# Patient Record
Sex: Male | Born: 1955 | Race: Black or African American | Hispanic: No | Marital: Single | Smoking: Never smoker
Health system: Southern US, Community
[De-identification: ages and names within clinical notes are randomized; demographics above are authoritative.]

## PROBLEM LIST (undated history)

## (undated) DIAGNOSIS — K5900 Constipation, unspecified: Secondary | ICD-10-CM

## (undated) DIAGNOSIS — R600 Localized edema: Secondary | ICD-10-CM

## (undated) DIAGNOSIS — T8859XA Other complications of anesthesia, initial encounter: Secondary | ICD-10-CM

## (undated) DIAGNOSIS — Z8719 Personal history of other diseases of the digestive system: Secondary | ICD-10-CM

## (undated) DIAGNOSIS — C61 Malignant neoplasm of prostate: Secondary | ICD-10-CM

## (undated) DIAGNOSIS — E785 Hyperlipidemia, unspecified: Secondary | ICD-10-CM

## (undated) DIAGNOSIS — G4733 Obstructive sleep apnea (adult) (pediatric): Secondary | ICD-10-CM

## (undated) DIAGNOSIS — Z789 Other specified health status: Secondary | ICD-10-CM

## (undated) DIAGNOSIS — I509 Heart failure, unspecified: Secondary | ICD-10-CM

## (undated) DIAGNOSIS — I10 Essential (primary) hypertension: Secondary | ICD-10-CM

## (undated) DIAGNOSIS — Z9989 Dependence on other enabling machines and devices: Secondary | ICD-10-CM

## (undated) DIAGNOSIS — G4730 Sleep apnea, unspecified: Secondary | ICD-10-CM

## (undated) DIAGNOSIS — M1990 Unspecified osteoarthritis, unspecified site: Secondary | ICD-10-CM

## (undated) DIAGNOSIS — K219 Gastro-esophageal reflux disease without esophagitis: Secondary | ICD-10-CM

## (undated) HISTORY — DX: Hyperlipidemia, unspecified: E78.5

## (undated) HISTORY — PX: CARDIAC CATHETERIZATION: SHX172

## (undated) HISTORY — DX: Morbid (severe) obesity due to excess calories: E66.01

## (undated) HISTORY — DX: Gastro-esophageal reflux disease without esophagitis: K21.9

## (undated) HISTORY — DX: Unspecified osteoarthritis, unspecified site: M19.90

## (undated) HISTORY — DX: Sleep apnea, unspecified: G47.30

## (undated) NOTE — *Deleted (*Deleted)
New Patient Office Visit  Subjective:  Patient ID: Thomas Gardner, male    DOB: 1956-01-24  Age: 84 y.o. MRN: [id number]         CC: No chief complaint on file.   HPI Thomas Gardner presents for visit to estb w/New Provider for psychotherapy & to transition care. Pt has recently exp'd excessive number of losses. 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  Pt presents today at the request of Clinician phone call on 03/28/20 due to Referral from Dr. Morey Hummingbird earlier this week (03/27/20) to f/u on his psychotherapy Tx.    Past Medical History:  Diagnosis Date  . Arthritis   . Asthma   . CHF (congestive heart failure) (HCC)   . Diabetes mellitus 10/20/01  . GERD (gastroesophageal reflux disease)   . Hyperlipidemia   . Hypertension   . Morbid obesity (HCC)   . Sleep apnea     Past Surgical History:  Procedure Laterality Date  . BREATH TEK H PYLORI  11/11/2011   Procedure: BREATH TEK H PYLORI;  Surgeon: Kandis Cocking, MD;  Location: [facility];  Service: General;  Laterality: N/A;  . CARDIAC CATHETERIZATION    . ESOPHAGOGASTRODUODENOSCOPY (EGD) WITH PROPOFOL N/A 05/04/2014   Procedure: ESOPHAGOGASTRODUODENOSCOPY (EGD) WITH PROPOFOL;  Surgeon: Florencia Reasons, MD;  Location: [facility] ENDOSCOPY;  Service: Endoscopy;  Laterality: N/A;    Family History  Problem Relation Age of Onset  . Cancer Mother        ovarian  . Diabetes Mother   . Stroke Father   . Cancer Maternal Aunt        breast  . Diabetes Maternal Aunt   . Cancer Maternal Uncle        colon    Social History   Socioeconomic History  . Marital status: Single    Spouse name: Not on file  . Number of children: Not on file  . Years of education: Not on file  . Highest education level: Not on file  Occupational  History  . Occupation: Beautician  Tobacco Use  . Smoking status: Never Smoker  . Smokeless tobacco: Never Used  Substance and Sexual Activity  . Alcohol use: No    Alcohol/week: 0.0 standard drinks  . Drug use: No  . Sexual activity: Not on file  Other Topics Concern  . Not on file  Social History Narrative  . Not on file   Social Determinants of Health   Financial Resource Strain:   . Difficulty of Paying Living Expenses: Not on file  Food Insecurity:   . Worried About Programme researcher, broadcasting/film/video in the Last Year: Not on file  . Ran Out of Food in the Last Year: Not on file  Transportation Needs:   . Lack of Transportation (Medical): Not on file  . Lack of Transportation (Non-Medical): Not on file  Physical Activity:   . Days of Exercise per Week: Not on file  . Minutes of Exercise per Session: Not on file  Stress:   . Feeling of Stress : Not on file  Social Connections:   . Frequency of Communication with Friends and Family: Not on file  . Frequency of Social Gatherings with Friends and Family: Not on file  . Attends Religious Services: Not on  file  . Active Member of Clubs or Organizations: Not on file  . Attends Banker Meetings: Not on file  . Marital Status: Not on file  Intimate Partner Violence:   . Fear of Current or Ex-Partner: Not on file  . Emotionally Abused: Not on file  . Physically Abused: Not on file  . Sexually Abused: Not on file    ROS Review of Systems  Objective:   Today's Vitals: There were no vitals taken for this visit.  Physical Exam  Assessment & Plan:                     Problem List Items Addressed This Visit    None      Outpatient Encounter Medications as of 03/29/2020  Medication Sig  . albuterol (PROVENTIL HFA;VENTOLIN HFA) 108 (90 Base) MCG/ACT inhaler Inhale 2 puffs into the lungs every 6 (six) hours as needed for wheezing or shortness of breath. [facility]  . carvedilol (COREG) 25 MG tablet Take 1 tablet (25  mg total) by mouth 2 (two) times daily with a meal.  . chlorthalidone (HYGROTON) 25 MG tablet Take 1 tablet (25 mg total) by mouth daily.  . insulin aspart (NOVOLOG) 100 UNIT/ML FlexPen Inject 12 Units into the skin 3 (three) times daily with meals.  . Insulin Degludec-Liraglutide (XULTOPHY) 100-3.6 UNIT-MG/ML SOPN Inject 35 Units into the skin daily.  . insulin glargine (LANTUS SOLOSTAR) 100 UNIT/ML Solostar Pen Inject 20 Units into the skin 2 (two) times daily. IM Program  . Insulin Pen Needle (UNIFINE PENTIPS) 31G X 5 MM MISC Place at tip of insulin pen and use as directed.  . liraglutide (VICTOZA) 18 MG/3ML SOPN Inject 1.8 mg into the skin daily. IM program  . losartan (COZAAR) 50 MG tablet Take 2 tablets (100 mg total) by mouth daily.  Marland Kitchen PROVENTIL HFA 108 (90 Base) MCG/ACT inhaler INHALE 2 PUFFS INTO THE LUNGS EVERY 6 HOURS AS NEEDED FOR WHEEZING ORSHORTNESS OF BREATH.  . rosuvastatin (CRESTOR) 20 MG tablet Take 1 tablet (20 mg total) by mouth daily. IM program, pick up  . sertraline (ZOLOFT) 50 MG tablet Take 1 tablet (50 mg total) by mouth daily.  Marland Kitchen ZETIA 10 MG tablet TAKE 1 TABLET (10MG  TOTAL) BY MOUTH DAILY.   No facility-administered encounter medications on file as of 03/29/2020.    Follow-up: No follow-ups on file.   Deneise Lever, LMFT

---

## 1998-07-11 ENCOUNTER — Ambulatory Visit (HOSPITAL_COMMUNITY): Admission: RE | Admit: 1998-07-11 | Discharge: 1998-07-11 | Payer: Self-pay | Admitting: Internal Medicine

## 2001-05-20 DIAGNOSIS — E119 Type 2 diabetes mellitus without complications: Secondary | ICD-10-CM

## 2001-05-20 HISTORY — DX: Type 2 diabetes mellitus without complications: E11.9

## 2004-08-07 ENCOUNTER — Emergency Department (HOSPITAL_COMMUNITY): Admission: EM | Admit: 2004-08-07 | Discharge: 2004-08-07 | Payer: Self-pay | Admitting: Emergency Medicine

## 2004-11-27 ENCOUNTER — Ambulatory Visit (HOSPITAL_COMMUNITY): Admission: RE | Admit: 2004-11-27 | Discharge: 2004-11-27 | Payer: Self-pay | Admitting: Internal Medicine

## 2004-11-27 ENCOUNTER — Ambulatory Visit: Payer: Self-pay | Admitting: Internal Medicine

## 2005-04-26 ENCOUNTER — Encounter: Admission: RE | Admit: 2005-04-26 | Discharge: 2005-04-26 | Payer: Self-pay | Admitting: Internal Medicine

## 2005-08-29 ENCOUNTER — Emergency Department (HOSPITAL_COMMUNITY): Admission: EM | Admit: 2005-08-29 | Discharge: 2005-08-29 | Payer: Self-pay | Admitting: Family Medicine

## 2006-06-23 ENCOUNTER — Encounter: Admission: RE | Admit: 2006-06-23 | Discharge: 2006-06-23 | Payer: Self-pay | Admitting: Orthopedic Surgery

## 2006-06-30 ENCOUNTER — Encounter: Admission: RE | Admit: 2006-06-30 | Discharge: 2006-06-30 | Payer: Self-pay | Admitting: Orthopedic Surgery

## 2006-08-27 ENCOUNTER — Ambulatory Visit (HOSPITAL_COMMUNITY): Admission: RE | Admit: 2006-08-27 | Discharge: 2006-08-27 | Payer: Self-pay | Admitting: Orthopedic Surgery

## 2007-12-31 ENCOUNTER — Emergency Department (HOSPITAL_COMMUNITY): Admission: EM | Admit: 2007-12-31 | Discharge: 2007-12-31 | Payer: Self-pay | Admitting: Family Medicine

## 2008-01-05 ENCOUNTER — Emergency Department (HOSPITAL_COMMUNITY): Admission: EM | Admit: 2008-01-05 | Discharge: 2008-01-05 | Payer: Self-pay | Admitting: Emergency Medicine

## 2008-06-02 ENCOUNTER — Emergency Department (HOSPITAL_COMMUNITY): Admission: EM | Admit: 2008-06-02 | Discharge: 2008-06-02 | Payer: Self-pay | Admitting: Emergency Medicine

## 2009-05-20 HISTORY — PX: CARDIAC CATHETERIZATION: SHX172

## 2010-07-22 ENCOUNTER — Emergency Department (HOSPITAL_BASED_OUTPATIENT_CLINIC_OR_DEPARTMENT_OTHER)
Admission: EM | Admit: 2010-07-22 | Discharge: 2010-07-22 | Disposition: A | Discharge status: Home / Self Care | Payer: BC Managed Care – PPO | Attending: Emergency Medicine | Admitting: Emergency Medicine

## 2010-07-22 ENCOUNTER — Emergency Department (INDEPENDENT_AMBULATORY_CARE_PROVIDER_SITE_OTHER): Payer: BC Managed Care – PPO

## 2010-07-22 DIAGNOSIS — E7800 Pure hypercholesterolemia, unspecified: Secondary | ICD-10-CM | POA: Insufficient documentation

## 2010-07-22 DIAGNOSIS — Z79899 Other long term (current) drug therapy: Secondary | ICD-10-CM | POA: Insufficient documentation

## 2010-07-22 DIAGNOSIS — I509 Heart failure, unspecified: Secondary | ICD-10-CM | POA: Insufficient documentation

## 2010-07-22 DIAGNOSIS — W010XXA Fall on same level from slipping, tripping and stumbling without subsequent striking against object, initial encounter: Secondary | ICD-10-CM

## 2010-07-22 DIAGNOSIS — IMO0002 Reserved for concepts with insufficient information to code with codable children: Secondary | ICD-10-CM | POA: Insufficient documentation

## 2010-07-22 DIAGNOSIS — M545 Low back pain: Secondary | ICD-10-CM

## 2010-07-22 DIAGNOSIS — J45909 Unspecified asthma, uncomplicated: Secondary | ICD-10-CM | POA: Insufficient documentation

## 2010-07-22 DIAGNOSIS — E119 Type 2 diabetes mellitus without complications: Secondary | ICD-10-CM | POA: Insufficient documentation

## 2010-07-25 ENCOUNTER — Emergency Department (HOSPITAL_BASED_OUTPATIENT_CLINIC_OR_DEPARTMENT_OTHER)
Admission: EM | Admit: 2010-07-25 | Discharge: 2010-07-25 | Disposition: A | Discharge status: Home / Self Care | Payer: BC Managed Care – PPO | Attending: Emergency Medicine | Admitting: Emergency Medicine

## 2010-07-25 DIAGNOSIS — Z79899 Other long term (current) drug therapy: Secondary | ICD-10-CM | POA: Insufficient documentation

## 2010-07-25 DIAGNOSIS — M549 Dorsalgia, unspecified: Secondary | ICD-10-CM | POA: Insufficient documentation

## 2010-07-25 DIAGNOSIS — M25579 Pain in unspecified ankle and joints of unspecified foot: Secondary | ICD-10-CM | POA: Insufficient documentation

## 2010-07-25 DIAGNOSIS — E119 Type 2 diabetes mellitus without complications: Secondary | ICD-10-CM | POA: Insufficient documentation

## 2010-07-25 DIAGNOSIS — E7800 Pure hypercholesterolemia, unspecified: Secondary | ICD-10-CM | POA: Insufficient documentation

## 2010-07-25 DIAGNOSIS — I509 Heart failure, unspecified: Secondary | ICD-10-CM | POA: Insufficient documentation

## 2010-07-25 DIAGNOSIS — M729 Fibroblastic disorder, unspecified: Secondary | ICD-10-CM | POA: Insufficient documentation

## 2010-07-25 DIAGNOSIS — I10 Essential (primary) hypertension: Secondary | ICD-10-CM | POA: Insufficient documentation

## 2010-09-03 LAB — URINE MICROSCOPIC-ADD ON

## 2010-09-03 LAB — URINALYSIS, ROUTINE W REFLEX MICROSCOPIC
Bilirubin Urine: NEGATIVE
Glucose, UA: NEGATIVE mg/dL
Ketones, ur: NEGATIVE mg/dL
Nitrite: NEGATIVE
Protein, ur: 30 mg/dL — AB
Specific Gravity, Urine: 1.02 (ref 1.005–1.030)
Urobilinogen, UA: 1 mg/dL (ref 0.0–1.0)
pH: 5.5 (ref 5.0–8.0)

## 2010-09-03 LAB — URINE CULTURE: Colony Count: >=100000

## 2010-09-03 LAB — DIFFERENTIAL
Basophils Absolute: 0.1 10*3/uL (ref 0.0–0.1)
Basophils Relative: 1 % (ref 0–1)
Eosinophils Absolute: 0.6 10*3/uL (ref 0.0–0.7)
Eosinophils Relative: 3 % (ref 0–5)
Lymphocytes Relative: 17 % (ref 12–46)
Lymphs Abs: 3 10*3/uL (ref 0.7–4.0)
Monocytes Absolute: 1.1 10*3/uL — ABNORMAL HIGH (ref 0.1–1.0)
Monocytes Relative: 6 % (ref 3–12)
Neutro Abs: 13 10*3/uL — ABNORMAL HIGH (ref 1.7–7.7)
Neutrophils Relative %: 73 % (ref 43–77)

## 2010-09-03 LAB — POCT I-STAT, CHEM 8
BUN: 16 mg/dL (ref 6–23)
Calcium, Ion: 1.13 mmol/L (ref 1.12–1.32)
Chloride: 100 mEq/L (ref 96–112)
Creatinine, Ser: 1.2 mg/dL (ref 0.4–1.5)
Glucose, Bld: 159 mg/dL — ABNORMAL HIGH (ref 70–99)
HCT: 45 % (ref 39.0–52.0)
Hemoglobin: 15.3 g/dL (ref 13.0–17.0)
Potassium: 3.5 mEq/L (ref 3.5–5.1)
Sodium: 138 mEq/L (ref 135–145)
TCO2: 28 mmol/L (ref 0–100)

## 2010-09-03 LAB — CBC
HCT: 41.7 % (ref 39.0–52.0)
Hemoglobin: 13.6 g/dL (ref 13.0–17.0)
MCHC: 32.5 g/dL (ref 30.0–36.0)
MCV: 83.4 fL (ref 78.0–100.0)
Platelets: 234 10*3/uL (ref 150–400)
RBC: 5 MIL/uL (ref 4.22–5.81)
RDW: 14.8 % (ref 11.5–15.5)
WBC: 17.8 10*3/uL — ABNORMAL HIGH (ref 4.0–10.5)

## 2011-01-09 ENCOUNTER — Emergency Department (HOSPITAL_BASED_OUTPATIENT_CLINIC_OR_DEPARTMENT_OTHER)
Admission: EM | Admit: 2011-01-09 | Discharge: 2011-01-09 | Disposition: A | Discharge status: Home / Self Care | Payer: BC Managed Care – PPO | Attending: Emergency Medicine | Admitting: Emergency Medicine

## 2011-01-09 ENCOUNTER — Emergency Department (INDEPENDENT_AMBULATORY_CARE_PROVIDER_SITE_OTHER): Payer: BC Managed Care – PPO

## 2011-01-09 ENCOUNTER — Other Ambulatory Visit: Payer: Self-pay

## 2011-01-09 DIAGNOSIS — R109 Unspecified abdominal pain: Secondary | ICD-10-CM

## 2011-01-09 DIAGNOSIS — R112 Nausea with vomiting, unspecified: Secondary | ICD-10-CM

## 2011-01-09 DIAGNOSIS — K859 Acute pancreatitis without necrosis or infection, unspecified: Secondary | ICD-10-CM

## 2011-01-09 DIAGNOSIS — R1110 Vomiting, unspecified: Secondary | ICD-10-CM | POA: Insufficient documentation

## 2011-01-09 DIAGNOSIS — I10 Essential (primary) hypertension: Secondary | ICD-10-CM | POA: Insufficient documentation

## 2011-01-09 DIAGNOSIS — E119 Type 2 diabetes mellitus without complications: Secondary | ICD-10-CM | POA: Insufficient documentation

## 2011-01-09 DIAGNOSIS — I509 Heart failure, unspecified: Secondary | ICD-10-CM | POA: Insufficient documentation

## 2011-01-09 DIAGNOSIS — R51 Headache: Secondary | ICD-10-CM | POA: Insufficient documentation

## 2011-01-09 DIAGNOSIS — R209 Unspecified disturbances of skin sensation: Secondary | ICD-10-CM | POA: Insufficient documentation

## 2011-01-09 HISTORY — DX: Essential (primary) hypertension: I10

## 2011-01-09 HISTORY — DX: Heart failure, unspecified: I50.9

## 2011-01-09 LAB — DIFFERENTIAL
Basophils Absolute: 0 10*3/uL (ref 0.0–0.1)
Basophils Relative: 0 % (ref 0–1)
Eosinophils Absolute: 0.4 10*3/uL (ref 0.0–0.7)
Eosinophils Relative: 4 % (ref 0–5)
Lymphocytes Relative: 26 % (ref 12–46)
Lymphs Abs: 2.6 10*3/uL (ref 0.7–4.0)
Monocytes Absolute: 0.8 10*3/uL (ref 0.1–1.0)
Monocytes Relative: 8 % (ref 3–12)
Neutro Abs: 6.1 10*3/uL (ref 1.7–7.7)
Neutrophils Relative %: 61 % (ref 43–77)

## 2011-01-09 LAB — URINALYSIS, ROUTINE W REFLEX MICROSCOPIC
Glucose, UA: NEGATIVE mg/dL
Ketones, ur: NEGATIVE mg/dL
Leukocytes, UA: NEGATIVE
Nitrite: NEGATIVE
Protein, ur: 30 mg/dL — AB
pH: 6 (ref 5.0–8.0)

## 2011-01-09 LAB — CBC
HCT: 40.1 % (ref 39.0–52.0)
Hemoglobin: 13.2 g/dL (ref 13.0–17.0)
MCH: 26.5 pg (ref 26.0–34.0)
MCHC: 32.9 g/dL (ref 30.0–36.0)
Platelets: 196 10*3/uL (ref 150–400)
RDW: 14.6 % (ref 11.5–15.5)
WBC: 10 10*3/uL (ref 4.0–10.5)

## 2011-01-09 LAB — COMPREHENSIVE METABOLIC PANEL
AST: 18 U/L (ref 0–37)
Albumin: 3.9 g/dL (ref 3.5–5.2)
Alkaline Phosphatase: 70 U/L (ref 39–117)
BUN: 15 mg/dL (ref 6–23)
CO2: 29 mEq/L (ref 19–32)
Calcium: 9.8 mg/dL (ref 8.4–10.5)
Chloride: 101 mEq/L (ref 96–112)
Creatinine, Ser: 1 mg/dL (ref 0.50–1.35)
GFR calc Af Amer: >60 mL/min (ref >60)
GFR calc non Af Amer: >60 mL/min (ref >60)
Glucose, Bld: 143 mg/dL — ABNORMAL HIGH (ref 70–99)
Sodium: 138 mEq/L (ref 135–145)
Total Bilirubin: 0.4 mg/dL (ref 0.3–1.2)
Total Protein: 7.6 g/dL (ref 6.0–8.3)

## 2011-01-09 LAB — URINE MICROSCOPIC-ADD ON

## 2011-01-09 LAB — LIPASE, BLOOD: Lipase: 69 U/L — ABNORMAL HIGH (ref 11–59)

## 2011-01-09 MED ORDER — PROMETHAZINE HCL 25 MG PO TABS: 25.0000 mg | ORAL_TABLET | Freq: Four times a day (QID) | ORAL | Status: AC | PRN

## 2011-01-09 MED ORDER — OXYCODONE-ACETAMINOPHEN 5-325 MG PO TABS: 2.0000 | ORAL_TABLET | ORAL | Status: AC | PRN

## 2011-01-09 NOTE — ED Notes (Signed)
Pt has c/o headache, numbness/tingling in left arm and bilateral legs x 2 weeks.  Nausea and vomiting started 1 week ago.  Last emeis was this am x1.

## 2011-01-09 NOTE — ED Notes (Signed)
Pt transported to ultrasound.

## 2011-01-09 NOTE — ED Provider Notes (Signed)
History     CSN: [id number] Arrival date & time: 01/09/2011  1:16 PM  Chief Complaint  Patient presents with  . Nausea  . Headache  . Emesis  . Numbness   Patient is a 55 y.o. male presenting with abdominal pain.  Abdominal Pain The primary symptoms of the illness include abdominal pain. The current episode started more than 2 days ago. The onset of the illness was gradual. The problem has not changed since onset. The illness is associated with eating. The patient has had a change in bowel habit. Additional symptoms associated with the illness include anorexia. Symptoms associated with the illness do not include chills, heartburn, constipation, urgency, hematuria, frequency or back pain. Significant associated medical issues include diabetes.  Pt reports diarrhea,  Some nausea, vomitted x 1,   Pt reports he has had some abdominal soreness after diarrhea.  Pt reports he has had a headache on and off.  Pt reports he has had congestive heart failure in the past.  No shortness of breath,  No chest pain  Past Medical History  Diagnosis Date  . CHF (congestive heart failure)   . Diabetes mellitus   . Hypertension     History reviewed. No pertinent past surgical history.  No family history on file.  History  Substance Use Topics  . Smoking status: Never Smoker   . Smokeless tobacco: Not on file  . Alcohol Use: No      Review of Systems  Constitutional: Negative for chills.  Gastrointestinal: Positive for abdominal pain and anorexia. Negative for heartburn and constipation.  Genitourinary: Negative for urgency, frequency and hematuria.  Musculoskeletal: Negative for back pain.  All other systems reviewed and are negative.    Physical Exam  BP 135/74  Pulse 77  Temp 98.4 F (36.9 C)  Resp 18  Ht 5\' 6"  (1.676 m)  Wt 289 lb (131.09 kg)  BMI 46.65 kg/m2  SpO2 95%  Physical Exam  Nursing note and vitals reviewed. Constitutional: He is oriented to person, place, and time.  He appears well-developed and well-nourished.  HENT:  Head: Normocephalic and atraumatic.  Eyes: Conjunctivae and EOM are normal. Pupils are equal, round, and reactive to light.  Neck: Normal range of motion. Neck supple.  Cardiovascular: Normal rate and regular rhythm.   Pulmonary/Chest: Effort normal and breath sounds normal.  Abdominal: Soft. He exhibits no distension and no mass. There is tenderness. There is no rebound and no guarding.  Musculoskeletal: Normal range of motion.  Neurological: He is alert and oriented to person, place, and time. He has normal reflexes.  Skin: Skin is warm.  Psychiatric: He has a normal mood and affect.    ED Course  Procedures  MDM Pt has slightly elevated lipase,  Ultrasound obtained and is normal. Patient has normal white blood cell count and liver functions are normal. Counseled patient on and possible early pancreatitis his advised him that is given prescription for Phenergan and Percocet. He is advised to see his physician for recheck tomorrow or the next day. Patient is advised to return to the emergency department if symptoms worsen or change.   Date: 01/09/2011  Rate: 66  Rhythm: normal sinus rhythm  QRS Axis: normal  Intervals: normal  ST/T Wave abnormalities: normal  Conduction Disutrbances:right bundle branch block  Narrative Interpretation:   Old EKG Reviewed: unchanged    Langston Masker, [place] 01/09/11 1820

## 2011-01-10 NOTE — ED Provider Notes (Signed)
Medical screening examination/treatment/procedure(s) were performed by non-physician practitioner and as supervising physician I was immediately available for consultation/collaboration.   Leldon Steege M Siani Utke, MD 01/10/11 0009 

## 2011-02-15 LAB — CULTURE, ROUTINE-ABSCESS: Gram Stain: NONE SEEN

## 2011-10-10 ENCOUNTER — Encounter (INDEPENDENT_AMBULATORY_CARE_PROVIDER_SITE_OTHER): Payer: Self-pay | Admitting: Surgery

## 2011-10-10 ENCOUNTER — Ambulatory Visit (INDEPENDENT_AMBULATORY_CARE_PROVIDER_SITE_OTHER): Payer: BC Managed Care – PPO | Admitting: Surgery

## 2011-10-10 NOTE — Progress Notes (Addendum)
Re:   ISSAAC Gardner DOB:   12-Dec-1955 MRN:   [id number]  ASSESSMENT AND PLAN: 1.  Morbid obesity  - weight - 287, BMI - 46.39  Per the 1991 NIH Consensus Statement, the patient is a candidate for bariatric surgery.  The patient attended our information session and reviewed the different types of bariatric surgery.    The patient is interested in the laparoscopic adjustable gastric band.  I discussed with the patient the indications and risks of lap band surgery.  The potential risks of surgery include, but are not limited to, bleeding, infection, DVT and PE, slippage and erosion of the band, open surgery, and death.  The patient understands the importance of compliance and long term follow-up with our group after surgery.  She was given literature regarding lap band surgery and encouraged to visit their web site, [email or web address], and register.  From here we'll obtain labs, x-rays, nutrition consult, and psych consult.  2.  History of CHF.    Sees Dr. Jeanella Cara.  Will need card clearance. [I received a note from Dr. Jacinto Halim.  The patient has an EF of 37%.  He had a sestamibi scan 08/15/2011 with mild diaphragmatic attenuation.  He is cleared for surgery.  DN 10/30/2011]  3.  Hypertension. 4.  Asthma. 5.  GERD. 6.  Sleep apnea  Getting fitted for a CPAP. 7.  Admits to being under a lot of stress. 8.  Insulin dependent diabetes mellitus  [HgbA1c - 10.3 - 10/28/2011 DN]  Chief Complaint  Patient presents with  . Other    lap band initial   REFERRING PHYSICIAN: Dorrene German, MD, MD  HISTORY OF PRESENT ILLNESS: DEMARRI Gardner is a 56 y.o. (DOB: 1955-08-10)  AA male whose primary care physician is Dorrene German, MD, MD and comes to me today for bariatric surgery.  The patient has been overweight much as of his adult life. He has tried multiple diets. He has tried Weight Watchers, weight loss online, oral dietary supplements, and physician supervised weight loss. He has tried several  weight loss medicines, but cannot remove her the names.  He has been to our information session were Dr. Lodema Pilot spoke. He knows some one, Mardene Speak, who has a LAP-BAND. He's been encouraged to consider bariatric surgery by his cardiologist Dr. Sherilyn Banker.  He talked about the stress that he is under.  He says it is much worse over the last 6 months.  His mother, who is getting older, is living with him.  And his job is stressful.    Past Medical History  Diagnosis Date  . CHF (congestive heart failure)   . Diabetes mellitus   . Hypertension   . Arthritis   . Asthma   . GERD (gastroesophageal reflux disease)   . Hyperlipidemia   . Sleep apnea       Past Surgical History  Procedure Date  . Cardiac catheterization       Current Outpatient Prescriptions  Medication Sig Dispense Refill  . albuterol (PROVENTIL,VENTOLIN) 90 MCG/ACT inhaler Inhale 1 puff into the lungs once as needed. For chest tightness or wheezing        . aspirin 81 MG tablet Take 81 mg by mouth daily.       . carvedilol (COREG) 12.5 MG tablet Take 12.5 mg by mouth 2 (two) times daily with a meal.       . carvedilol (COREG) 25 MG tablet Take 25 mg by mouth 2 (two) times  daily.        . chlorthalidone (HYGROTON) 25 MG tablet Take 25 mg by mouth daily.        Marland Kitchen glimepiride (AMARYL) 2 MG tablet Take 2 mg by mouth daily.       Marland Kitchen glimepiride (AMARYL) 4 MG tablet Take 4 mg by mouth daily.        . hydrochlorothiazide (HYDRODIURIL) 12.5 MG tablet Take 12.5 mg by mouth daily.       Marland Kitchen ibuprofen (ADVIL,MOTRIN) 200 MG tablet Take 200 mg by mouth 3 (three) times daily as needed. For headache or foot pain        . insulin glulisine (APIDRA SOLOSTAR) 100 UNIT/ML injection Inject 2-6 Units into the skin 3 (three) times daily. According to sliding scale       . potassium chloride (KLOR-CON) 10 MEQ CR tablet Take 10 mEq by mouth daily.        . SitaGLIPtin Phosphate (JANUVIA PO) Take by mouth daily.           Allergies  Allergen Reactions  . Erythromycin Nausea And Vomiting    REVIEW OF SYSTEMS: Skin:  No history of rash.  No history of abnormal moles. Infection:  No history of hepatitis or HIV.  No history of MRSA. Neurologic:  No history of stroke.  No history of seizure.  No history of headaches. Cardiac:  Hypertension x 10 years.  His of CHF.  Sees Dr. Yates Decamp.  Had card cath in 2009.  He said something about a "filter", but I do not see a reason for this. Pulmonary:  Does not smoke cigarettes.  No asthma or bronchitis.  No OSA/CPAP.  Endocrine:  Insulin dependent diabetic x 2 years.  Hypercholesterolemia. Gastrointestinal:  He said he had reflux, but that is actually a lot better.  No history of liver disease.  No history of gall bladder disease.  No history of pancreas disease.  No history of colon disease.  He said he had pancreatitis 6 months, but was not hospitalized for it.  His PCP stopped his Janumet at that time.  He had a colonoscopy about 5 years ago. Urologic:  No history of kidney stones.  No history of bladder infections. Musculoskeletal:  Has some back pain.  Sees Dr. Milagros Reap. Hematologic:  No bleeding disorder.  No history of anemia.  Not anticoagulated. Psycho-social:  The patient is oriented.   The patient has no obvious psychologic or social impairment to understanding our conversation and plan.  But patient admits to being under a lot of stress.  SOCIAL and FAMILY HISTORY: Unmarried. 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  PHYSICAL EXAM: BP 114/66  Pulse 80  Temp(Src) 97.6 F (36.4 C) (Temporal)  Resp 16  Ht 5\' 6"  (1.676 m)  Wt 287 lb 6.4 oz (130.364 kg)  BMI 46.39 kg/m2  General: Obese [employer] M who is alert and generally healthy appearing.  HEENT: Normal. Pupils equal. Good dentition. Neck: Supple. No mass.  No thyroid mass. Lymph Nodes:  No supraclavicular or cervical nodes. Lungs: Clear to auscultation and symmetric breath sounds.  Tattoo  right upper chest. Heart:  RRR. No murmur or rub.  Abdomen: Soft. No mass. No tenderness. No hernia. Normal bowel sounds.  No abdominal scars.  More apple than pear.  Some panniculus. Rectal: No mass.  Guaic negative. Extremities:  Good strength and ROM  in upper and lower extremities. Neurologic:  Grossly intact to motor and sensory function. Psychiatric: Has normal mood and  affect. Behavior is normal.   DATA REVIEWED: Notes in chart.  Ovidio Kin, MD,  [facility] [address]  [address]   [place], [place]    [address] Phone:  [phone] FAX:  [phone]

## 2011-10-21 ENCOUNTER — Ambulatory Visit (HOSPITAL_COMMUNITY)
Admission: RE | Admit: 2011-10-21 | Discharge: 2011-10-21 | Disposition: A | Discharge status: Home / Self Care | Payer: BC Managed Care – PPO | Source: Ambulatory Visit | Attending: Surgery | Admitting: Surgery

## 2011-10-21 ENCOUNTER — Other Ambulatory Visit: Payer: Self-pay

## 2011-10-21 DIAGNOSIS — E785 Hyperlipidemia, unspecified: Secondary | ICD-10-CM | POA: Insufficient documentation

## 2011-10-21 DIAGNOSIS — K219 Gastro-esophageal reflux disease without esophagitis: Secondary | ICD-10-CM | POA: Insufficient documentation

## 2011-10-21 DIAGNOSIS — I10 Essential (primary) hypertension: Secondary | ICD-10-CM | POA: Insufficient documentation

## 2011-10-21 DIAGNOSIS — K7689 Other specified diseases of liver: Secondary | ICD-10-CM | POA: Insufficient documentation

## 2011-10-21 DIAGNOSIS — K824 Cholesterolosis of gallbladder: Secondary | ICD-10-CM | POA: Insufficient documentation

## 2011-10-21 DIAGNOSIS — I509 Heart failure, unspecified: Secondary | ICD-10-CM | POA: Insufficient documentation

## 2011-10-21 DIAGNOSIS — Z6841 Body Mass Index (BMI) 40.0 and over, adult: Secondary | ICD-10-CM | POA: Insufficient documentation

## 2011-10-21 DIAGNOSIS — G4730 Sleep apnea, unspecified: Secondary | ICD-10-CM | POA: Insufficient documentation

## 2011-10-29 LAB — LIPID PANEL
Chol/HDL Ratio: 8.3 ratio units — ABNORMAL HIGH (ref 0.0–5.0)
Cholesterol, Total: 283 mg/dL — ABNORMAL HIGH (ref 100–199)
HDL: 34 mg/dL — ABNORMAL LOW (ref >39)
LDL Calculated: 216 mg/dL — ABNORMAL HIGH (ref 0–99)
Triglycerides: 163 mg/dL — ABNORMAL HIGH (ref 0–149)

## 2011-10-29 LAB — COMPREHENSIVE METABOLIC PANEL
Albumin/Globulin Ratio: 1.3 (ref 1.1–2.5)
Albumin: 4.2 g/dL (ref 3.5–5.5)
Alkaline Phosphatase: 72 IU/L (ref 25–150)
BUN/Creatinine Ratio: 14 (ref 9–20)
BUN: 14 mg/dL (ref 6–24)
Creatinine, Ser: 0.98 mg/dL (ref 0.76–1.27)
GFR calc Af Amer: 99 mL/min/{1.73_m2} (ref >59)
GFR calc non Af Amer: 86 mL/min/{1.73_m2} (ref >59)
Globulin, Total: 3.2 g/dL (ref 1.5–4.5)
Glucose: 225 mg/dL — ABNORMAL HIGH (ref 65–99)
Total Bilirubin: 0.3 mg/dL (ref 0.0–1.2)
Total Protein: 7.4 g/dL (ref 6.0–8.5)

## 2011-10-29 LAB — CBC WITH DIFFERENTIAL/PLATELET
Basophils Absolute: 0 10*3/uL (ref 0.0–0.2)
Basos: 0 % (ref 0–3)
HCT: 44 % (ref 37.5–51.0)
Hemoglobin: 14.2 g/dL (ref 12.6–17.7)
Lymphocytes Absolute: 2.8 10*3/uL (ref 0.7–4.5)
Lymphs: 32 % (ref 14–46)
MCHC: 32.3 g/dL (ref 31.5–35.7)
Monocytes: 6 % (ref 4–13)
Neutrophils Absolute: 5 10*3/uL (ref 1.8–7.8)
RBC: 5.33 x10E6/uL (ref 4.14–5.80)

## 2011-10-29 LAB — TSH: TSH: 1.13 u[IU]/mL (ref 0.450–4.500)

## 2011-11-11 ENCOUNTER — Encounter (HOSPITAL_COMMUNITY)
Admission: RE | Disposition: A | Discharge status: Home / Self Care | Payer: Self-pay | Source: Ambulatory Visit | Attending: Surgery

## 2011-11-11 ENCOUNTER — Ambulatory Visit (HOSPITAL_COMMUNITY)
Admission: RE | Admit: 2011-11-11 | Discharge: 2011-11-11 | Disposition: A | Discharge status: Home / Self Care | Payer: BC Managed Care – PPO | Source: Ambulatory Visit | Attending: Surgery | Admitting: Surgery

## 2011-11-11 DIAGNOSIS — Z01818 Encounter for other preprocedural examination: Secondary | ICD-10-CM | POA: Insufficient documentation

## 2011-11-11 HISTORY — PX: BREATH TEK H PYLORI: SHX5422

## 2011-11-11 SURGERY — BREATH TEST, FOR HELICOBACTER PYLORI

## 2011-11-12 ENCOUNTER — Encounter (HOSPITAL_COMMUNITY): Payer: Self-pay | Admitting: Surgery

## 2011-11-25 ENCOUNTER — Encounter: Payer: BC Managed Care – PPO | Attending: Surgery | Admitting: *Deleted

## 2011-11-25 ENCOUNTER — Encounter: Payer: Self-pay | Admitting: *Deleted

## 2011-11-25 DIAGNOSIS — Z713 Dietary counseling and surveillance: Secondary | ICD-10-CM | POA: Insufficient documentation

## 2011-11-25 DIAGNOSIS — Z01818 Encounter for other preprocedural examination: Secondary | ICD-10-CM | POA: Insufficient documentation

## 2011-11-25 NOTE — Patient Instructions (Addendum)
   Follow Pre-Op Nutrition Goals to prepare for Lap Band Surgery.   Call the [facility] at [phone] once you have been given your surgery date to enrolled in the Pre-Op Nutrition Class. You will need to attend this nutrition class 3-4 weeks prior to your surgery.  

## 2011-11-25 NOTE — Progress Notes (Signed)
  Pre-Op Assessment Visit:  Pre-Operative LAGB Surgery  Medical Nutrition Therapy:  Appt start time: 1045   End time:  1130.  Patient was seen on 11/25/2011 for Pre-Operative LAGB Nutrition Assessment. Assessment and letter of approval faxed to [facility] Bariatric Surgery Program coordinator on 11/25/2011.  Approval letter sent to [facility] Scan center and will be available in the chart under the media tab.  Handouts given during visit include:  Pre-Op Goals   Bariatric Surgery Protein Shakes   Patient to call for Pre-Op and Post-Op Nutrition Education at the [facility] when surgery is scheduled.

## 2011-12-26 ENCOUNTER — Emergency Department (HOSPITAL_COMMUNITY)
Admission: EM | Admit: 2011-12-26 | Discharge: 2011-12-27 | Disposition: A | Discharge status: Home / Self Care | Payer: BC Managed Care – PPO | Attending: Emergency Medicine | Admitting: Emergency Medicine

## 2011-12-26 ENCOUNTER — Other Ambulatory Visit: Payer: Self-pay

## 2011-12-26 ENCOUNTER — Encounter (HOSPITAL_COMMUNITY): Payer: Self-pay | Admitting: Emergency Medicine

## 2011-12-26 DIAGNOSIS — I10 Essential (primary) hypertension: Secondary | ICD-10-CM | POA: Insufficient documentation

## 2011-12-26 DIAGNOSIS — Z79899 Other long term (current) drug therapy: Secondary | ICD-10-CM | POA: Insufficient documentation

## 2011-12-26 DIAGNOSIS — M129 Arthropathy, unspecified: Secondary | ICD-10-CM | POA: Insufficient documentation

## 2011-12-26 DIAGNOSIS — E119 Type 2 diabetes mellitus without complications: Secondary | ICD-10-CM | POA: Insufficient documentation

## 2011-12-26 DIAGNOSIS — K219 Gastro-esophageal reflux disease without esophagitis: Secondary | ICD-10-CM | POA: Insufficient documentation

## 2011-12-26 DIAGNOSIS — T310 Burns involving less than 10% of body surface: Secondary | ICD-10-CM | POA: Insufficient documentation

## 2011-12-26 DIAGNOSIS — T300 Burn of unspecified body region, unspecified degree: Secondary | ICD-10-CM

## 2011-12-26 DIAGNOSIS — Z794 Long term (current) use of insulin: Secondary | ICD-10-CM | POA: Insufficient documentation

## 2011-12-26 DIAGNOSIS — T23209A Burn of second degree of unspecified hand, unspecified site, initial encounter: Secondary | ICD-10-CM | POA: Insufficient documentation

## 2011-12-26 DIAGNOSIS — I509 Heart failure, unspecified: Secondary | ICD-10-CM | POA: Insufficient documentation

## 2011-12-26 DIAGNOSIS — E785 Hyperlipidemia, unspecified: Secondary | ICD-10-CM | POA: Insufficient documentation

## 2011-12-26 DIAGNOSIS — X19XXXA Contact with other heat and hot substances, initial encounter: Secondary | ICD-10-CM | POA: Insufficient documentation

## 2011-12-26 DIAGNOSIS — G4730 Sleep apnea, unspecified: Secondary | ICD-10-CM | POA: Insufficient documentation

## 2011-12-26 MED ORDER — SILVER SULFADIAZINE 1 % EX CREA
TOPICAL_CREAM | Freq: Once | CUTANEOUS | Status: AC
  Administered 2011-12-26: 1 via TOPICAL
  Filled 2011-12-26: qty 50
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
## 2011-12-26 NOTE — ED Notes (Signed)
Pt. In yellow fall risk bracelet and red socks
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
## 2011-12-27 NOTE — ED Provider Notes (Signed)
Medical screening examination/treatment/procedure(s) were performed by non-physician practitioner and as supervising physician I was immediately available for consultation/collaboration.  Derwood Kaplan, MD 12/27/11 0865

## 2012-04-06 ENCOUNTER — Encounter (HOSPITAL_BASED_OUTPATIENT_CLINIC_OR_DEPARTMENT_OTHER): Payer: Self-pay

## 2012-04-06 ENCOUNTER — Emergency Department (HOSPITAL_BASED_OUTPATIENT_CLINIC_OR_DEPARTMENT_OTHER)
Admission: EM | Admit: 2012-04-06 | Discharge: 2012-04-06 | Disposition: A | Discharge status: Home / Self Care | Payer: BC Managed Care – PPO | Attending: Emergency Medicine | Admitting: Emergency Medicine

## 2012-04-06 ENCOUNTER — Emergency Department (HOSPITAL_BASED_OUTPATIENT_CLINIC_OR_DEPARTMENT_OTHER): Payer: BC Managed Care – PPO

## 2012-04-06 DIAGNOSIS — Z794 Long term (current) use of insulin: Secondary | ICD-10-CM | POA: Insufficient documentation

## 2012-04-06 DIAGNOSIS — I10 Essential (primary) hypertension: Secondary | ICD-10-CM | POA: Insufficient documentation

## 2012-04-06 DIAGNOSIS — R209 Unspecified disturbances of skin sensation: Secondary | ICD-10-CM | POA: Insufficient documentation

## 2012-04-06 DIAGNOSIS — Z8719 Personal history of other diseases of the digestive system: Secondary | ICD-10-CM | POA: Insufficient documentation

## 2012-04-06 DIAGNOSIS — E785 Hyperlipidemia, unspecified: Secondary | ICD-10-CM | POA: Insufficient documentation

## 2012-04-06 DIAGNOSIS — Z7982 Long term (current) use of aspirin: Secondary | ICD-10-CM | POA: Insufficient documentation

## 2012-04-06 DIAGNOSIS — Z8739 Personal history of other diseases of the musculoskeletal system and connective tissue: Secondary | ICD-10-CM | POA: Insufficient documentation

## 2012-04-06 DIAGNOSIS — J45909 Unspecified asthma, uncomplicated: Secondary | ICD-10-CM | POA: Insufficient documentation

## 2012-04-06 DIAGNOSIS — E119 Type 2 diabetes mellitus without complications: Secondary | ICD-10-CM | POA: Insufficient documentation

## 2012-04-06 DIAGNOSIS — Z79899 Other long term (current) drug therapy: Secondary | ICD-10-CM | POA: Insufficient documentation

## 2012-04-06 DIAGNOSIS — R600 Localized edema: Secondary | ICD-10-CM

## 2012-04-06 DIAGNOSIS — I509 Heart failure, unspecified: Secondary | ICD-10-CM | POA: Insufficient documentation

## 2012-04-06 DIAGNOSIS — R609 Edema, unspecified: Secondary | ICD-10-CM | POA: Insufficient documentation

## 2012-04-06 LAB — CBC WITH DIFFERENTIAL/PLATELET
Eosinophils Absolute: 0.6 10*3/uL (ref 0.0–0.7)
Eosinophils Relative: 5 % (ref 0–5)
HCT: 38.7 % — ABNORMAL LOW (ref 39.0–52.0)
Hemoglobin: 12.8 g/dL — ABNORMAL LOW (ref 13.0–17.0)
Lymphocytes Relative: 25 % (ref 12–46)
Lymphs Abs: 2.9 10*3/uL (ref 0.7–4.0)
MCH: 26.3 pg (ref 26.0–34.0)
MCV: 79.6 fL (ref 78.0–100.0)
Monocytes Absolute: 1 10*3/uL (ref 0.1–1.0)
Monocytes Relative: 9 % (ref 3–12)
RBC: 4.86 MIL/uL (ref 4.22–5.81)

## 2012-04-06 LAB — BASIC METABOLIC PANEL
CO2: 29 mEq/L (ref 19–32)
Calcium: 9.4 mg/dL (ref 8.4–10.5)
Chloride: 99 mEq/L (ref 96–112)
Glucose, Bld: 199 mg/dL — ABNORMAL HIGH (ref 70–99)
Potassium: 3.5 mEq/L (ref 3.5–5.1)
Sodium: 138 mEq/L (ref 135–145)

## 2012-04-06 MED ORDER — HYDROCODONE-ACETAMINOPHEN 5-325 MG PO TABS: 2.0000 | ORAL_TABLET | ORAL | Status: DC | PRN

## 2012-04-06 MED ORDER — FUROSEMIDE 40 MG PO TABS: 40.0000 mg | ORAL_TABLET | Freq: Every day | ORAL | Status: DC

## 2012-04-06 NOTE — ED Notes (Signed)
C/o left leg pain x 1.5 weeks-right leg x 3 days

## 2012-04-06 NOTE — ED Provider Notes (Signed)
History     CSN: [id number]  Arrival date & time 04/06/12  1212   First MD Initiated Contact with Patient 04/06/12 1323      Chief Complaint  Patient presents with  . Leg Pain    (Consider location/radiation/quality/duration/timing/severity/associated sxs/prior treatment) Patient is a 56 y.o. male presenting with leg pain. The history is provided by the patient. No language interpreter was used.  Leg Pain  The incident occurred more than 1 week ago. The incident occurred at work. There was no injury mechanism. The pain is present in the left leg and right leg. The quality of the pain is described as aching and burning. The pain is moderate. The pain has been constant since onset. Associated symptoms include numbness. He reports no foreign bodies present. He has tried nothing for the symptoms. The treatment provided no relief.  Pt complains of pain in both legs.  Pt reports both legs are swollen and painful.  Pt admits to being on his feet a lot at work  Past Medical History  Diagnosis Date  . CHF (congestive heart failure)   . Diabetes mellitus   . Hypertension   . Arthritis   . Asthma   . GERD (gastroesophageal reflux disease)   . Hyperlipidemia   . Sleep apnea   . Morbid obesity     Past Surgical History  Procedure Date  . Cardiac catheterization   . Breath tek h pylori 11/11/2011    Procedure: BREATH TEK H PYLORI;  Surgeon: Kandis Cocking, MD;  Location: [facility];  Service: General;  Laterality: N/A;    Family History  Problem Relation Age of Onset  . Cancer Mother     ovarian  . Diabetes Mother   . Stroke Father   . Cancer Maternal Aunt     breast  . Diabetes Maternal Aunt   . Cancer Maternal Uncle     colon    History  Substance Use Topics  . Smoking status: Never Smoker   . Smokeless tobacco: Not on file  . Alcohol Use: No      Review of Systems  Neurological: Positive for numbness.  All other systems reviewed and are  negative.    Allergies  Erythromycin  Home Medications   Current Outpatient Rx  Name  Route  Sig  Dispense  Refill  . MELOXICAM PO   Oral   Take by mouth.         . ALBUTEROL 90 MCG/ACT IN AERS   Inhalation   Inhale 1 puff into the lungs once as needed. For chest tightness or wheezing           . ASPIRIN EC 81 MG PO TBEC   Oral   Take 81 mg by mouth at bedtime.         Marland Kitchen CARVEDILOL 12.5 MG PO TABS   Oral   Take 12.5 mg by mouth 2 (two) times daily with a meal.          . CHLORTHALIDONE 25 MG PO TABS   Oral   Take 25 mg by mouth daily.           Marland Kitchen GLIMEPIRIDE 4 MG PO TABS   Oral   Take 4 mg by mouth daily.           Marland Kitchen HYDROCHLOROTHIAZIDE 12.5 MG PO TABS   Oral   Take 12.5 mg by mouth daily.          . IBUPROFEN 200 MG PO  TABS   Oral   Take 200 mg by mouth 3 (three) times daily as needed. For headache or foot pain           . INSULIN GLARGINE 100 UNIT/ML [place] SOLN   Subcutaneous   Inject 40 Units into the skin at bedtime.         . INSULIN GLULISINE 100 UNIT/ML [place] SOLN   Subcutaneous   Inject 2-6 Units into the skin 3 (three) times daily. According to sliding scale         . POTASSIUM CHLORIDE 10 MEQ PO TBCR   Oral   Take 10 mEq by mouth daily.           Marland Kitchen SITAGLIPTIN PHOSPHATE 50 MG PO TABS   Oral   Take 50 mg by mouth daily.           BP 151/78  Pulse 80  Temp 98.5 F (36.9 C) (Oral)  Resp 20  Ht 5\' 6"  (1.676 m)  Wt 287 lb (130.182 kg)  BMI 46.32 kg/m2  SpO2 96%  Physical Exam  Nursing note and vitals reviewed. Constitutional: He is oriented to person, place, and time. He appears well-developed and well-nourished.  HENT:  Head: Normocephalic.  Right Ear: External ear normal.  Left Ear: External ear normal.  Eyes: Pupils are equal, round, and reactive to light.  Neck: Normal range of motion.  Cardiovascular: Normal rate.   Pulmonary/Chest: Effort normal and breath sounds normal.  Abdominal: Soft. Bowel sounds are  normal.  Musculoskeletal: He exhibits edema and tenderness.  Neurological: He is alert and oriented to person, place, and time. He has normal reflexes.  Skin: Skin is warm.  Psychiatric: He has a normal mood and affect.    ED Course  Procedures (including critical care time)  Labs Reviewed  BASIC METABOLIC PANEL - Abnormal; Notable for the following:    Glucose, Bld 199 (*)     GFR calc non Af Amer 82 (*)     All other components within normal limits  CBC WITH DIFFERENTIAL - Abnormal; Notable for the following:    WBC 11.4 (*)     Hemoglobin 12.8 (*)     HCT 38.7 (*)     All other components within normal limits   Dg Chest 2 View  04/06/2012  *RADIOLOGY REPORT*  Clinical Data: Bilateral leg swelling and pain.  CHEST - 2 VIEW  Comparison: 04/26/2005.  Findings: Trachea is midline.  Heart is at the upper limits of normal in size.  Lungs are clear.  No pleural fluid.  IMPRESSION: No acute findings.   Original Report Authenticated By: Leanna Battles, M.D.     Date: 04/06/2012  Rate: 71  Rhythm: normal sinus rhythm  QRS Axis: left  Intervals: normal  ST/T Wave abnormalities: normal  Conduction Disutrbances:none  Narrative Interpretation:   Old EKG Reviewed: unchanged   No diagnosis found.    MDM   Results for orders placed during the hospital encounter of 04/06/12  BASIC METABOLIC PANEL      Component Value Range   Sodium 138  135 - 145 mEq/L   Potassium 3.5  3.5 - 5.1 mEq/L   Chloride 99  96 - 112 mEq/L   CO2 29  19 - 32 mEq/L   Glucose, Bld 199 (*) 70 - 99 mg/dL   BUN 14  6 - 23 mg/dL   Creatinine, Ser 4.78  0.50 - 1.35 mg/dL   Calcium 9.4  8.4 - 29.5 mg/dL  GFR calc non Af Amer 82 (*) >90 mL/min   GFR calc Af Amer >90  >90 mL/min  CBC WITH DIFFERENTIAL      Component Value Range   WBC 11.4 (*) 4.0 - 10.5 K/uL   RBC 4.86  4.22 - 5.81 MIL/uL   Hemoglobin 12.8 (*) 13.0 - 17.0 g/dL   HCT 16.1 (*) 09.6 - 04.5 %   MCV 79.6  78.0 - 100.0 fL   MCH 26.3  26.0 -  34.0 pg   MCHC 33.1  30.0 - 36.0 g/dL   RDW 40.9  81.1 - 91.4 %   Platelets 187  150 - 400 K/uL   Neutrophils Relative 60  43 - 77 %   Neutro Abs 6.9  1.7 - 7.7 K/uL   Lymphocytes Relative 25  12 - 46 %   Lymphs Abs 2.9  0.7 - 4.0 K/uL   Monocytes Relative 9  3 - 12 %   Monocytes Absolute 1.0  0.1 - 1.0 K/uL   Eosinophils Relative 5  0 - 5 %   Eosinophils Absolute 0.6  0.0 - 0.7 K/uL   Basophils Relative 0  0 - 1 %   Basophils Absolute 0.1  0.0 - 0.1 K/uL   Dg Chest 2 View  04/06/2012  *RADIOLOGY REPORT*  Clinical Data: Bilateral leg swelling and pain.  CHEST - 2 VIEW  Comparison: 04/26/2005.  Findings: Trachea is midline.  Heart is at the upper limits of normal in size.  Lungs are clear.  No pleural fluid.  IMPRESSION: No acute findings.   Original Report Authenticated By: Leanna Battles, M.D.      Pt given rx for lasix and hydrocodone for pain.   I advised pt to see Dr. Concepcion Elk for recheck next week.      Lonia Skinner [place], [place] 04/06/12 1601

## 2012-04-06 NOTE — ED Provider Notes (Signed)
Medical screening examination/treatment/procedure(s) were performed by non-physician practitioner and as supervising physician I was immediately available for consultation/collaboration.  Doug Sou, MD 04/06/12 [phone]

## 2013-08-24 ENCOUNTER — Emergency Department (HOSPITAL_BASED_OUTPATIENT_CLINIC_OR_DEPARTMENT_OTHER)
Admission: EM | Admit: 2013-08-24 | Discharge: 2013-08-24 | Disposition: A | Discharge status: Home / Self Care | Payer: BC Managed Care – PPO | Attending: Emergency Medicine | Admitting: Emergency Medicine

## 2013-08-24 ENCOUNTER — Encounter (HOSPITAL_BASED_OUTPATIENT_CLINIC_OR_DEPARTMENT_OTHER): Payer: Self-pay | Admitting: Emergency Medicine

## 2013-08-24 ENCOUNTER — Emergency Department (HOSPITAL_BASED_OUTPATIENT_CLINIC_OR_DEPARTMENT_OTHER): Payer: BC Managed Care – PPO

## 2013-08-24 DIAGNOSIS — Z79899 Other long term (current) drug therapy: Secondary | ICD-10-CM | POA: Insufficient documentation

## 2013-08-24 DIAGNOSIS — I10 Essential (primary) hypertension: Secondary | ICD-10-CM | POA: Insufficient documentation

## 2013-08-24 DIAGNOSIS — Z8669 Personal history of other diseases of the nervous system and sense organs: Secondary | ICD-10-CM | POA: Insufficient documentation

## 2013-08-24 DIAGNOSIS — I509 Heart failure, unspecified: Secondary | ICD-10-CM | POA: Insufficient documentation

## 2013-08-24 DIAGNOSIS — Z7982 Long term (current) use of aspirin: Secondary | ICD-10-CM | POA: Insufficient documentation

## 2013-08-24 DIAGNOSIS — M129 Arthropathy, unspecified: Secondary | ICD-10-CM | POA: Insufficient documentation

## 2013-08-24 DIAGNOSIS — Z794 Long term (current) use of insulin: Secondary | ICD-10-CM | POA: Insufficient documentation

## 2013-08-24 DIAGNOSIS — R109 Unspecified abdominal pain: Secondary | ICD-10-CM

## 2013-08-24 DIAGNOSIS — J45909 Unspecified asthma, uncomplicated: Secondary | ICD-10-CM | POA: Insufficient documentation

## 2013-08-24 DIAGNOSIS — Z9889 Other specified postprocedural states: Secondary | ICD-10-CM | POA: Insufficient documentation

## 2013-08-24 DIAGNOSIS — E119 Type 2 diabetes mellitus without complications: Secondary | ICD-10-CM | POA: Insufficient documentation

## 2013-08-24 DIAGNOSIS — R1013 Epigastric pain: Secondary | ICD-10-CM | POA: Insufficient documentation

## 2013-08-24 DIAGNOSIS — Z8719 Personal history of other diseases of the digestive system: Secondary | ICD-10-CM | POA: Insufficient documentation

## 2013-08-24 LAB — COMPREHENSIVE METABOLIC PANEL
ALBUMIN: 4 g/dL (ref 3.5–5.2)
ALT: 15 U/L (ref 0–53)
AST: 15 U/L (ref 0–37)
Alkaline Phosphatase: 77 U/L (ref 39–117)
BILIRUBIN TOTAL: 0.5 mg/dL (ref 0.3–1.2)
BUN: 20 mg/dL (ref 6–23)
CHLORIDE: 94 meq/L — AB (ref 96–112)
CO2: 31 mEq/L (ref 19–32)
CREATININE: 1.2 mg/dL (ref 0.50–1.35)
Calcium: 10.1 mg/dL (ref 8.4–10.5)
GFR calc Af Amer: 75 mL/min — ABNORMAL LOW (ref >90)
GFR calc non Af Amer: 65 mL/min — ABNORMAL LOW (ref >90)
Glucose, Bld: 290 mg/dL — ABNORMAL HIGH (ref 70–99)
Potassium: 3.5 mEq/L — ABNORMAL LOW (ref 3.7–5.3)
Sodium: 137 mEq/L (ref 137–147)
Total Protein: 8 g/dL (ref 6.0–8.3)

## 2013-08-24 LAB — URINALYSIS, ROUTINE W REFLEX MICROSCOPIC
Bilirubin Urine: NEGATIVE
GLUCOSE, UA: 500 mg/dL — AB
Hgb urine dipstick: NEGATIVE
Ketones, ur: NEGATIVE mg/dL
LEUKOCYTES UA: NEGATIVE
Nitrite: NEGATIVE
PH: 6.5 (ref 5.0–8.0)
Protein, ur: NEGATIVE mg/dL
Specific Gravity, Urine: 1.011 (ref 1.005–1.030)
Urobilinogen, UA: 0.2 mg/dL (ref 0.0–1.0)

## 2013-08-24 LAB — CBC WITH DIFFERENTIAL/PLATELET
BASOS PCT: 1 % (ref 0–1)
Basophils Absolute: 0.1 10*3/uL (ref 0.0–0.1)
EOS ABS: 0.1 10*3/uL (ref 0.0–0.7)
EOS PCT: 1 % (ref 0–5)
HCT: 43 % (ref 39.0–52.0)
HEMOGLOBIN: 14.3 g/dL (ref 13.0–17.0)
LYMPHS PCT: 33 % (ref 12–46)
Lymphs Abs: 3.1 10*3/uL (ref 0.7–4.0)
MCH: 26.6 pg (ref 26.0–34.0)
MCHC: 33.3 g/dL (ref 30.0–36.0)
MCV: 80.1 fL (ref 78.0–100.0)
MONOS PCT: 5 % (ref 3–12)
Monocytes Absolute: 0.5 10*3/uL (ref 0.1–1.0)
NEUTROS PCT: 60 % (ref 43–77)
Neutro Abs: 5.5 10*3/uL (ref 1.7–7.7)
Platelets: 197 10*3/uL (ref 150–400)
RBC: 5.37 MIL/uL (ref 4.22–5.81)
RDW: 14.7 % (ref 11.5–15.5)
WBC: 9.3 10*3/uL (ref 4.0–10.5)

## 2013-08-24 LAB — TROPONIN I

## 2013-08-24 LAB — LIPASE, BLOOD: Lipase: 22 U/L (ref 11–59)

## 2013-08-24 MED ORDER — PANTOPRAZOLE SODIUM 40 MG IV SOLR: 40.0000 mg | Freq: Once | INTRAVENOUS | Status: DC

## 2013-08-24 MED ORDER — SODIUM CHLORIDE 0.9 % IV SOLN
Freq: Once | INTRAVENOUS | Status: AC
  Administered 2013-08-24: 14:00:00 via INTRAVENOUS

## 2013-08-24 MED ORDER — FAMOTIDINE IN NACL 20-0.9 MG/50ML-% IV SOLN
20.0000 mg | Freq: Once | INTRAVENOUS | Status: AC
  Administered 2013-08-24: 20 mg via INTRAVENOUS
  Filled 2013-08-24: qty 50

## 2013-08-24 MED ORDER — OMEPRAZOLE 20 MG PO CPDR: 20.0000 mg | DELAYED_RELEASE_CAPSULE | Freq: Every day | ORAL | Status: DC

## 2013-08-24 NOTE — ED Notes (Signed)
Patient given ginger ale. 

## 2013-08-24 NOTE — ED Provider Notes (Signed)
CSN: [id number]     Arrival date & time 08/24/13  1219 History   First MD Initiated Contact with Patient 08/24/13 1227     Chief Complaint  Patient presents with  . Abdominal Pain     (Consider location/radiation/quality/duration/timing/severity/associated sxs/prior Treatment) Patient is a 58 y.o. male presenting with abdominal pain. The history is provided by the patient. No language interpreter was used.  Abdominal Pain Pain location:  Epigastric Pain severity:  Moderate Duration:  4 weeks Progression:  Unchanged Associated symptoms: no chills, no constipation, no diarrhea, no fever, no nausea and no vomiting   Associated symptoms comment:  Pain limited to epigastric abdomen for the past 4 weeks. He states that cold liquids and eating make the pain some better without achieving full resolution. No nausea, vomiting, change in bowel movement. No SOB, chest pain or cough. He denies fever at any time. He reports EGD in the past that resulted in diagnosis of hiatal hernia. He denies use of regular medications for reflux or ulcers.    Past Medical History  Diagnosis Date  . CHF (congestive heart failure)   . Diabetes mellitus   . Hypertension   . Arthritis   . Asthma   . GERD (gastroesophageal reflux disease)   . Hyperlipidemia   . Sleep apnea   . Morbid obesity    Past Surgical History  Procedure Laterality Date  . Cardiac catheterization    . Breath tek h pylori  11/11/2011    Procedure: BREATH TEK H PYLORI;  Surgeon: Shann Medal, MD;  Location: [facility];  Service: General;  Laterality: N/A;   Family History  Problem Relation Age of Onset  . Cancer Mother     ovarian  . Diabetes Mother   . Stroke Father   . Cancer Maternal Aunt     breast  . Diabetes Maternal Aunt   . Cancer Maternal Uncle     colon   History  Substance Use Topics  . Smoking status: Never Smoker   . Smokeless tobacco: Not on file  . Alcohol Use: No    Review of Systems  Constitutional:  Negative for fever and chills.  HENT: Negative.   Respiratory: Negative.   Cardiovascular: Negative.   Gastrointestinal: Positive for abdominal pain. Negative for nausea, vomiting, diarrhea and constipation.  Genitourinary: Negative.   Musculoskeletal: Negative.   Skin: Negative.   Neurological: Negative.       Allergies  Erythromycin  Home Medications   Current Outpatient Rx  Name  Route  Sig  Dispense  Refill  . albuterol (PROVENTIL,VENTOLIN) 90 MCG/ACT inhaler   Inhalation   Inhale 1 puff into the lungs once as needed. For chest tightness or wheezing           . aspirin EC 81 MG tablet   Oral   Take 81 mg by mouth at bedtime.         . carvedilol (COREG) 12.5 MG tablet   Oral   Take 12.5 mg by mouth 2 (two) times daily with a meal.          . chlorthalidone (HYGROTON) 25 MG tablet   Oral   Take 25 mg by mouth daily.           . furosemide (LASIX) 40 MG tablet   Oral   Take 1 tablet (40 mg total) by mouth daily.   30 tablet   0   . glimepiride (AMARYL) 4 MG tablet   Oral   Take  4 mg by mouth daily.           . hydrochlorothiazide (HYDRODIURIL) 12.5 MG tablet   Oral   Take 12.5 mg by mouth daily.          Marland Kitchen HYDROcodone-acetaminophen (NORCO/VICODIN) 5-325 MG per tablet   Oral   Take 2 tablets by mouth every 4 (four) hours as needed for pain.   10 tablet   0   . ibuprofen (ADVIL,MOTRIN) 200 MG tablet   Oral   Take 200 mg by mouth 3 (three) times daily as needed. For headache or foot pain           . insulin glargine (LANTUS) 100 UNIT/ML injection   Subcutaneous   Inject 60 Units into the skin at bedtime.          . insulin glulisine (APIDRA SOLOSTAR) 100 UNIT/ML injection   Subcutaneous   Inject 2-6 Units into the skin 3 (three) times daily. According to sliding scale         . MELOXICAM PO   Oral   Take by mouth.         . potassium chloride (KLOR-CON) 10 MEQ CR tablet   Oral   Take 10 mEq by mouth daily.           .  sitaGLIPtin (JANUVIA) 50 MG tablet   Oral   Take 50 mg by mouth daily.          BP 165/78  Pulse 77  Temp(Src) 98.2 F (36.8 C) (Oral)  Resp 18  SpO2 98% Physical Exam  Constitutional: He is oriented to person, place, and time. He appears well-developed and well-nourished.  HENT:  Head: Normocephalic.  Neck: Normal range of motion. Neck supple.  Cardiovascular: Normal rate and regular rhythm.   Pulmonary/Chest: Effort normal and breath sounds normal.  Abdominal: Soft. Bowel sounds are normal. There is tenderness. There is no rebound and no guarding.  Musculoskeletal: Normal range of motion.  Neurological: He is alert and oriented to person, place, and time.  Skin: Skin is warm and dry. No rash noted.  Psychiatric: He has a normal mood and affect.    ED Course  Procedures (including critical care time) Labs Review Labs Reviewed  CBC WITH DIFFERENTIAL  COMPREHENSIVE METABOLIC PANEL  LIPASE, BLOOD  URINALYSIS, ROUTINE W REFLEX MICROSCOPIC  TROPONIN I   Results for orders placed during the hospital encounter of 08/24/13  CBC WITH DIFFERENTIAL      Result Value Ref Range   WBC 9.3  4.0 - 10.5 K/uL   RBC 5.37  4.22 - 5.81 MIL/uL   Hemoglobin 14.3  13.0 - 17.0 g/dL   HCT 43.0  39.0 - 52.0 %   MCV 80.1  78.0 - 100.0 fL   MCH 26.6  26.0 - 34.0 pg   MCHC 33.3  30.0 - 36.0 g/dL   RDW 14.7  11.5 - 15.5 %   Platelets 197  150 - 400 K/uL   Neutrophils Relative % 60  43 - 77 %   Lymphocytes Relative 33  12 - 46 %   Monocytes Relative 5  3 - 12 %   Eosinophils Relative 1  0 - 5 %   Basophils Relative 1  0 - 1 %   Neutro Abs 5.5  1.7 - 7.7 K/uL   Lymphs Abs 3.1  0.7 - 4.0 K/uL   Monocytes Absolute 0.5  0.1 - 1.0 K/uL   Eosinophils Absolute 0.1  0.0 - 0.7  K/uL   Basophils Absolute 0.1  0.0 - 0.1 K/uL   RBC Morphology STOMATOCYTES     WBC Morphology ATYPICAL LYMPHOCYTES     Smear Review LARGE PLATELETS PRESENT    COMPREHENSIVE METABOLIC PANEL      Result Value Ref Range    Sodium 137  137 - 147 mEq/L   Potassium 3.5 (*) 3.7 - 5.3 mEq/L   Chloride 94 (*) 96 - 112 mEq/L   CO2 31  19 - 32 mEq/L   Glucose, Bld 290 (*) 70 - 99 mg/dL   BUN 20  6 - 23 mg/dL   Creatinine, Ser 1.20  0.50 - 1.35 mg/dL   Calcium 10.1  8.4 - 10.5 mg/dL   Total Protein 8.0  6.0 - 8.3 g/dL   Albumin 4.0  3.5 - 5.2 g/dL   AST 15  0 - 37 U/L   ALT 15  0 - 53 U/L   Alkaline Phosphatase 77  39 - 117 U/L   Total Bilirubin 0.5  0.3 - 1.2 mg/dL   GFR calc non Af Amer 65 (*) >90 mL/min   GFR calc Af Amer 75 (*) >90 mL/min  LIPASE, BLOOD      Result Value Ref Range   Lipase 22  11 - 59 U/L  URINALYSIS, ROUTINE W REFLEX MICROSCOPIC      Result Value Ref Range   Color, Urine YELLOW  YELLOW   APPearance CLEAR  CLEAR   Specific Gravity, Urine 1.011  1.005 - 1.030   pH 6.5  5.0 - 8.0   Glucose, UA 500 (*) NEGATIVE mg/dL   Hgb urine dipstick NEGATIVE  NEGATIVE   Bilirubin Urine NEGATIVE  NEGATIVE   Ketones, ur NEGATIVE  NEGATIVE mg/dL   Protein, ur NEGATIVE  NEGATIVE mg/dL   Urobilinogen, UA 0.2  0.0 - 1.0 mg/dL   Nitrite NEGATIVE  NEGATIVE   Leukocytes, UA NEGATIVE  NEGATIVE  TROPONIN I      Result Value Ref Range   Troponin I <0.30  <0.30 ng/mL   US Abdomen Complete  08/24/2013   CLINICAL DATA:  Epigastric pain  EXAM: ULTRASOUND ABDOMEN COMPLETE  COMPARISON:  Ultrasound abdomen 01/09/2011  FINDINGS: Gallbladder:  No gallstones or wall thickening visualized. No sonographic Murphy sign noted.  Common bile duct:  Diameter: 4.7 mm  Liver:  Diffusely echogenic liver which is difficult to scan. No focal liver lesion.  IVC:  Not visualized  Pancreas:  Not well visualized due to gas  Spleen:  Size and appearance within normal limits.  Right Kidney:  Length: 12.5 cm. Echogenicity within normal limits. No mass or hydronephrosis visualized.  Left Kidney:  Length: 12.4 cm. Echogenicity within normal limits. No mass or hydronephrosis visualized.  Abdominal aorta:  No aneurysm visualized.  Other findings:   None.  IMPRESSION: Fatty infiltration of the liver.  Negative for gallstones.  Exam quality limited by body habitus and bowel gas.   Electronically Signed   By: Franchot Gallo M.D.   On: 08/24/2013 13:54   Imaging Review No results found.   EKG Interpretation None      MDM   Final diagnoses:  None    1. Abdominal pain  DDx: Gall stones ([place] negative for stones) vs ulcer disease vs reflux assoc. with hiatal hernia. He is better with Pepcid here. VSS. No significant lab abnormalities. Stable for discharge and GI referral for follow up.  Dewaine Oats, PA-C 08/24/13 1521

## 2013-08-24 NOTE — Discharge Instructions (Signed)
Abdominal Pain, Adult °Many things can cause abdominal pain. Usually, abdominal pain is not caused by a disease and will improve without treatment. It can often be observed and treated at home. Your health care provider will do a physical exam and possibly order blood tests and X-rays to help determine the seriousness of your pain. However, in many cases, more time must pass before a clear cause of the pain can be found. Before that point, your health care provider may not know if you need more testing or further treatment. °HOME CARE INSTRUCTIONS  °Monitor your abdominal pain for any changes. The following actions may help to alleviate any discomfort you are experiencing: °· Only take over-the-counter or prescription medicines as directed by your health care provider. °· Do not take laxatives unless directed to do so by your health care provider. °· Try a clear liquid diet (broth, tea, or water) as directed by your health care provider. Slowly move to a bland diet as tolerated. °SEEK MEDICAL CARE IF: °· You have unexplained abdominal pain. °· You have abdominal pain associated with nausea or diarrhea. °· You have pain when you urinate or have a bowel movement. °· You experience abdominal pain that wakes you in the night. °· You have abdominal pain that is worsened or improved by eating food. °· You have abdominal pain that is worsened with eating fatty foods. °SEEK IMMEDIATE MEDICAL CARE IF:  °· Your pain does not go away within 2 hours. °· You have a fever. °· You keep throwing up (vomiting). °· Your pain is felt only in portions of the abdomen, such as the right side or the left lower portion of the abdomen. °· You pass bloody or black tarry stools. °MAKE SURE YOU: °· Understand these instructions.   °· Will watch your condition.   °· Will get help right away if you are not doing well or get worse.   °Document Released: 02/13/2005 Document Revised: 02/24/2013 Document Reviewed: 01/13/2013 °[employer]® Patient  Information ©2014 [employer]. ° °

## 2013-08-24 NOTE — ED Notes (Signed)
Pt amb to room 11 with quick steady gait in nad. Pt reports epigastric pain x 4 weeks, pt states "I know I have a hiatal hernia, and it is tender right where that hernia is..." pt reports normal bm this am. No emesis or fevers.

## 2013-08-24 NOTE — ED Provider Notes (Signed)
Medical screening examination/treatment/procedure(s) were performed by non-physician practitioner and as supervising physician I was immediately available for consultation/collaboration.   EKG Interpretation   Date/Time:  Tuesday August 24 2013 14:10:46 EDT Ventricular Rate:  71 PR Interval:  168 QRS Duration: 124 QT Interval:  408 QTC Calculation: 443 R Axis:   -35 Text Interpretation:  Normal sinus rhythm Left axis deviation Left  ventricular hypertrophy with QRS widening Abnormal ECG No significant  change was found Confirmed by [facility]  MD, TREY ([id number]) on 08/24/2013 2:19:50  PM        Houston Siren III, MD 08/24/13 [phone]

## 2013-12-24 ENCOUNTER — Encounter (HOSPITAL_BASED_OUTPATIENT_CLINIC_OR_DEPARTMENT_OTHER): Payer: Self-pay | Admitting: Emergency Medicine

## 2013-12-24 ENCOUNTER — Emergency Department (HOSPITAL_BASED_OUTPATIENT_CLINIC_OR_DEPARTMENT_OTHER)
Admission: EM | Admit: 2013-12-24 | Discharge: 2013-12-24 | Disposition: A | Discharge status: Home / Self Care | Payer: BC Managed Care – PPO | Attending: Emergency Medicine | Admitting: Emergency Medicine

## 2013-12-24 DIAGNOSIS — S199XXA Unspecified injury of neck, initial encounter: Secondary | ICD-10-CM | POA: Diagnosis present

## 2013-12-24 DIAGNOSIS — I509 Heart failure, unspecified: Secondary | ICD-10-CM | POA: Diagnosis not present

## 2013-12-24 DIAGNOSIS — S0993XA Unspecified injury of face, initial encounter: Secondary | ICD-10-CM | POA: Insufficient documentation

## 2013-12-24 DIAGNOSIS — S139XXA Sprain of joints and ligaments of unspecified parts of neck, initial encounter: Secondary | ICD-10-CM | POA: Insufficient documentation

## 2013-12-24 DIAGNOSIS — Y939 Activity, unspecified: Secondary | ICD-10-CM | POA: Insufficient documentation

## 2013-12-24 DIAGNOSIS — Z7982 Long term (current) use of aspirin: Secondary | ICD-10-CM | POA: Insufficient documentation

## 2013-12-24 DIAGNOSIS — I10 Essential (primary) hypertension: Secondary | ICD-10-CM | POA: Diagnosis not present

## 2013-12-24 DIAGNOSIS — J45909 Unspecified asthma, uncomplicated: Secondary | ICD-10-CM | POA: Diagnosis not present

## 2013-12-24 DIAGNOSIS — K219 Gastro-esophageal reflux disease without esophagitis: Secondary | ICD-10-CM | POA: Insufficient documentation

## 2013-12-24 DIAGNOSIS — Y929 Unspecified place or not applicable: Secondary | ICD-10-CM | POA: Diagnosis not present

## 2013-12-24 DIAGNOSIS — M129 Arthropathy, unspecified: Secondary | ICD-10-CM | POA: Insufficient documentation

## 2013-12-24 DIAGNOSIS — Z794 Long term (current) use of insulin: Secondary | ICD-10-CM | POA: Insufficient documentation

## 2013-12-24 DIAGNOSIS — X58XXXA Exposure to other specified factors, initial encounter: Secondary | ICD-10-CM | POA: Diagnosis not present

## 2013-12-24 DIAGNOSIS — Z79899 Other long term (current) drug therapy: Secondary | ICD-10-CM | POA: Diagnosis not present

## 2013-12-24 DIAGNOSIS — E119 Type 2 diabetes mellitus without complications: Secondary | ICD-10-CM | POA: Insufficient documentation

## 2013-12-24 DIAGNOSIS — M5412 Radiculopathy, cervical region: Secondary | ICD-10-CM | POA: Insufficient documentation

## 2013-12-24 DIAGNOSIS — S161XXA Strain of muscle, fascia and tendon at neck level, initial encounter: Secondary | ICD-10-CM

## 2013-12-24 MED ORDER — OXYCODONE-ACETAMINOPHEN 5-325 MG PO TABS: 1.0000 | ORAL_TABLET | ORAL | Status: DC | PRN

## 2013-12-24 MED ORDER — OXYCODONE-ACETAMINOPHEN 5-325 MG PO TABS: 1.0000 | ORAL_TABLET | Freq: Once | ORAL | Status: DC

## 2013-12-24 MED ORDER — CYCLOBENZAPRINE HCL 10 MG PO TABS: 10.0000 mg | ORAL_TABLET | Freq: Two times a day (BID) | ORAL | Status: DC | PRN

## 2013-12-24 NOTE — Discharge Instructions (Signed)

## 2013-12-24 NOTE — ED Notes (Signed)
C/o pain to left side of next and down left arm x 2 weeks-pain "moves in different spots" " i can move a a certain way and the pain goes away"

## 2013-12-24 NOTE — ED Notes (Signed)
PA at bedside.

## 2013-12-24 NOTE — ED Provider Notes (Signed)
CSN: [id number]     Arrival date & time 12/24/13  1746 History   First MD Initiated Contact with Patient 12/24/13 1814     Chief Complaint  Patient presents with  . Neck Pain     (Consider location/radiation/quality/duration/timing/severity/associated sxs/prior Treatment) Patient is a 58 y.o. male presenting with neck pain. The history is provided by the patient. No language interpreter was used.  Neck Pain Pain location:  L side Quality:  Shooting Associated symptoms: no chest pain, no fever, no numbness and no weakness   Associated symptoms comment:  Left neck soreness and left upper extremity pain for the past 2 weeks without known trauma. He reports the pain in his arm changes locations but the neck pain is consistent. Worse with certain positions. No numbness of arm. He denies dropping objects out of his grip. No swelling noted.   Past Medical History  Diagnosis Date  . CHF (congestive heart failure)   . Diabetes mellitus   . Hypertension   . Arthritis   . Asthma   . GERD (gastroesophageal reflux disease)   . Hyperlipidemia   . Sleep apnea   . Morbid obesity    Past Surgical History  Procedure Laterality Date  . Cardiac catheterization    . Breath tek h pylori  11/11/2011    Procedure: BREATH TEK H PYLORI;  Surgeon: Shann Medal, MD;  Location: [facility];  Service: General;  Laterality: N/A;   Family History  Problem Relation Age of Onset  . Cancer Mother     ovarian  . Diabetes Mother   . Stroke Father   . Cancer Maternal Aunt     breast  . Diabetes Maternal Aunt   . Cancer Maternal Uncle     colon   History  Substance Use Topics  . Smoking status: Never Smoker   . Smokeless tobacco: Not on file  . Alcohol Use: No    Review of Systems  Constitutional: Negative for fever and chills.  Cardiovascular: Negative for chest pain.  Musculoskeletal: Positive for neck pain.       See HPI  Skin: Negative.   Neurological: Negative.  Negative for weakness and  numbness.      Allergies  Erythromycin  Home Medications   Prior to Admission medications   Medication Sig Start Date End Date Taking? Authorizing Provider  albuterol (PROVENTIL,VENTOLIN) 90 MCG/ACT inhaler Inhale 1 puff into the lungs once as needed. For chest tightness or wheezing      Historical Provider, MD  aspirin EC 81 MG tablet Take 81 mg by mouth at bedtime.    Historical Provider, MD  carvedilol (COREG) 12.5 MG tablet Take 12.5 mg by mouth 2 (two) times daily with a meal.     Historical Provider, MD  chlorthalidone (HYGROTON) 25 MG tablet Take 25 mg by mouth daily.      Historical Provider, MD  cyclobenzaprine (FLEXERIL) 10 MG tablet Take 1 tablet (10 mg total) by mouth 2 (two) times daily as needed for muscle spasms. 12/24/13   [name], PA-C  furosemide (LASIX) 40 MG tablet Take 1 tablet (40 mg total) by mouth daily. 04/06/12   [name], PA-C  glimepiride (AMARYL) 4 MG tablet Take 4 mg by mouth daily.      Historical Provider, MD  hydrochlorothiazide (HYDRODIURIL) 12.5 MG tablet Take 12.5 mg by mouth daily.     Historical Provider, MD  HYDROcodone-acetaminophen (NORCO/VICODIN) 5-325 MG per tablet Take 2 tablets by mouth every 4 (  four) hours as needed for pain. 04/06/12   [name], PA-C  ibuprofen (ADVIL,MOTRIN) 200 MG tablet Take 200 mg by mouth 3 (three) times daily as needed. For headache or foot pain      Historical Provider, MD  insulin glargine (LANTUS) 100 UNIT/ML injection Inject 60 Units into the skin at bedtime.     Historical Provider, MD  insulin glulisine (APIDRA SOLOSTAR) 100 UNIT/ML injection Inject 2-6 Units into the skin 3 (three) times daily. According to sliding scale    Historical Provider, MD  MELOXICAM PO Take by mouth.    Historical Provider, MD  omeprazole (PRILOSEC) 20 MG capsule Take 1 capsule (20 mg total) by mouth daily. 08/24/13   [name], PA-C  oxyCODONE-acetaminophen (PERCOCET/ROXICET) 5-325 MG per tablet Take 1-2 tablets by  mouth every 4 (four) hours as needed for severe pain. 12/24/13   [name], PA-C  potassium chloride (KLOR-CON) 10 MEQ CR tablet Take 10 mEq by mouth daily.      Historical Provider, MD  sitaGLIPtin (JANUVIA) 50 MG tablet Take 50 mg by mouth daily.    Historical Provider, MD   BP 153/75  Pulse 77  Temp(Src) 98 F (36.7 C) (Oral)  Resp 20  Ht 5\' 6"  (1.676 m)  Wt 282 lb (127.914 kg)  BMI 45.54 kg/m2  SpO2 97% Physical Exam  Constitutional: He is oriented to person, place, and time. He appears well-developed and well-nourished.  Neck: Normal range of motion.  Pulmonary/Chest: Effort normal.  Musculoskeletal: Normal range of motion.  Grip strength equal in bilateral upper extremities. Left arm has no swelling or redness. FrOM all joints. There is left paracervical tenderness also without swelling. No midline cervical tenderness.   Neurological: He is alert and oriented to person, place, and time.  Skin: Skin is warm and dry.  Psychiatric: He has a normal mood and affect.    ED Course  Procedures (including critical care time) Labs Review Labs Reviewed - No data to display  Imaging Review No results found.   EKG Interpretation None      MDM   Final diagnoses:  Cervical strain, acute, initial encounter  Cervical radicular pain    He is given pain relief and muscle relaxer and encouraged to follow up with PCP for recheck. No neurologic deficits. No chest pain or SOB - no concern for ACS.    Dewaine Oats, PA-C 01/03/14 1554

## 2014-01-03 NOTE — ED Provider Notes (Signed)
Medical screening examination/treatment/procedure(s) were performed by non-physician practitioner and as supervising physician I was immediately available for consultation/collaboration.   EKG Interpretation None        Ephraim Hamburger, MD 01/03/14 2003

## 2014-04-29 ENCOUNTER — Encounter (HOSPITAL_COMMUNITY): Payer: Self-pay | Admitting: Emergency Medicine

## 2014-04-29 ENCOUNTER — Emergency Department (HOSPITAL_COMMUNITY)
Admission: EM | Admit: 2014-04-29 | Discharge: 2014-04-29 | Disposition: A | Discharge status: Home / Self Care | Payer: BC Managed Care – PPO | Attending: Emergency Medicine | Admitting: Emergency Medicine

## 2014-04-29 DIAGNOSIS — R42 Dizziness and giddiness: Secondary | ICD-10-CM | POA: Diagnosis not present

## 2014-04-29 DIAGNOSIS — E119 Type 2 diabetes mellitus without complications: Secondary | ICD-10-CM | POA: Insufficient documentation

## 2014-04-29 DIAGNOSIS — R112 Nausea with vomiting, unspecified: Secondary | ICD-10-CM | POA: Diagnosis not present

## 2014-04-29 DIAGNOSIS — Z791 Long term (current) use of non-steroidal anti-inflammatories (NSAID): Secondary | ICD-10-CM | POA: Insufficient documentation

## 2014-04-29 DIAGNOSIS — Z794 Long term (current) use of insulin: Secondary | ICD-10-CM | POA: Diagnosis not present

## 2014-04-29 DIAGNOSIS — Z79899 Other long term (current) drug therapy: Secondary | ICD-10-CM | POA: Insufficient documentation

## 2014-04-29 DIAGNOSIS — I509 Heart failure, unspecified: Secondary | ICD-10-CM | POA: Diagnosis not present

## 2014-04-29 DIAGNOSIS — K219 Gastro-esophageal reflux disease without esophagitis: Secondary | ICD-10-CM | POA: Diagnosis not present

## 2014-04-29 DIAGNOSIS — I9589 Other hypotension: Secondary | ICD-10-CM | POA: Diagnosis not present

## 2014-04-29 DIAGNOSIS — Z8669 Personal history of other diseases of the nervous system and sense organs: Secondary | ICD-10-CM | POA: Diagnosis not present

## 2014-04-29 DIAGNOSIS — M1990 Unspecified osteoarthritis, unspecified site: Secondary | ICD-10-CM | POA: Diagnosis not present

## 2014-04-29 DIAGNOSIS — R531 Weakness: Secondary | ICD-10-CM | POA: Diagnosis present

## 2014-04-29 DIAGNOSIS — Z7982 Long term (current) use of aspirin: Secondary | ICD-10-CM | POA: Insufficient documentation

## 2014-04-29 DIAGNOSIS — J45909 Unspecified asthma, uncomplicated: Secondary | ICD-10-CM | POA: Diagnosis not present

## 2014-04-29 DIAGNOSIS — I10 Essential (primary) hypertension: Secondary | ICD-10-CM | POA: Diagnosis not present

## 2014-04-29 LAB — I-STAT CHEM 8, ED
BUN: 43 mg/dL — AB (ref 6–23)
CHLORIDE: 100 meq/L (ref 96–112)
Calcium, Ion: 1.21 mmol/L (ref 1.12–1.23)
Creatinine, Ser: 1.1 mg/dL (ref 0.50–1.35)
Glucose, Bld: 300 mg/dL — ABNORMAL HIGH (ref 70–99)
HCT: 30 % — ABNORMAL LOW (ref 39.0–52.0)
Hemoglobin: 10.2 g/dL — ABNORMAL LOW (ref 13.0–17.0)
Potassium: 3.2 mEq/L — ABNORMAL LOW (ref 3.7–5.3)
SODIUM: 140 meq/L (ref 137–147)
TCO2: 26 mmol/L (ref 0–100)

## 2014-04-29 LAB — BASIC METABOLIC PANEL
ANION GAP: 13 (ref 5–15)
BUN: 46 mg/dL — ABNORMAL HIGH (ref 6–23)
CHLORIDE: 99 meq/L (ref 96–112)
CO2: 27 meq/L (ref 19–32)
Calcium: 9.3 mg/dL (ref 8.4–10.5)
Creatinine, Ser: 0.97 mg/dL (ref 0.50–1.35)
GFR calc Af Amer: >90 mL/min (ref >90)
GFR calc non Af Amer: 89 mL/min — ABNORMAL LOW (ref >90)
Glucose, Bld: 296 mg/dL — ABNORMAL HIGH (ref 70–99)
Potassium: 3.3 mEq/L — ABNORMAL LOW (ref 3.7–5.3)
SODIUM: 139 meq/L (ref 137–147)

## 2014-04-29 LAB — CBC
HCT: 29.1 % — ABNORMAL LOW (ref 39.0–52.0)
Hemoglobin: 9.3 g/dL — ABNORMAL LOW (ref 13.0–17.0)
MCH: 25.8 pg — ABNORMAL LOW (ref 26.0–34.0)
MCHC: 32 g/dL (ref 30.0–36.0)
MCV: 80.8 fL (ref 78.0–100.0)
PLATELETS: 193 10*3/uL (ref 150–400)
RBC: 3.6 MIL/uL — AB (ref 4.22–5.81)
RDW: 14.7 % (ref 11.5–15.5)
WBC: 12.3 10*3/uL — AB (ref 4.0–10.5)

## 2014-04-29 LAB — I-STAT CG4 LACTIC ACID, ED: Lactic Acid, Venous: 1.29 mmol/L (ref 0.5–2.2)

## 2014-04-29 LAB — CBG MONITORING, ED: Glucose-Capillary: 291 mg/dL — ABNORMAL HIGH (ref 70–99)

## 2014-04-29 MED ORDER — SODIUM CHLORIDE 0.9 % IV SOLN
1000.0000 mL | Freq: Once | INTRAVENOUS | Status: AC
  Administered 2014-04-29: 1000 mL via INTRAVENOUS

## 2014-04-29 MED ORDER — ONDANSETRON HCL 4 MG/2ML IJ SOLN
4.0000 mg | Freq: Once | INTRAMUSCULAR | Status: AC
  Administered 2014-04-29: 4 mg via INTRAVENOUS
  Filled 2014-04-29: qty 2

## 2014-04-29 MED ORDER — ONDANSETRON HCL 4 MG PO TABS: 4.0000 mg | ORAL_TABLET | Freq: Four times a day (QID) | ORAL | Status: DC | PRN

## 2014-04-29 MED ORDER — SODIUM CHLORIDE 0.9 % IV BOLUS (SEPSIS)
1000.0000 mL | Freq: Once | INTRAVENOUS | Status: AC
  Administered 2014-04-29: 1000 mL via INTRAVENOUS

## 2014-04-29 MED ORDER — SODIUM CHLORIDE 0.9 % IV SOLN
1000.0000 mL | INTRAVENOUS | Status: DC
Start: 2014-04-29 — End: 2014-04-29
  Administered 2014-04-29: 1000 mL via INTRAVENOUS

## 2014-04-29 NOTE — ED Notes (Signed)
MD at bedside. 

## 2014-04-29 NOTE — ED Notes (Addendum)
Pt arrives via ems, from home, states he stood up, felt hot and dizzy, had one episode of projectile vomiting. Pt also reports having a black tarry stool this morning and again this evening, states he has been feeling weak today, having some stomach discomfort. 4mg  zofran given by ems prior to arrival at hospital. Pt alert, oriented, nad. Denies nau/vomiting at this time, only c/o headache.

## 2014-04-29 NOTE — Discharge Instructions (Signed)
Drink plenty of fluids.  Nausea and Vomiting Nausea is a sick feeling that often comes before throwing up (vomiting). Vomiting is a reflex where stomach contents come out of your mouth. Vomiting can cause severe loss of body fluids (dehydration). Children and elderly adults can become dehydrated quickly, especially if they also have diarrhea. Nausea and vomiting are symptoms of a condition or disease. It is important to find the cause of your symptoms. CAUSES   Direct irritation of the stomach lining. This irritation can result from increased acid production (gastroesophageal reflux disease), infection, food poisoning, taking certain medicines (such as nonsteroidal anti-inflammatory drugs), alcohol use, or tobacco use.  Signals from the brain.These signals could be caused by a headache, heat exposure, an inner ear disturbance, increased pressure in the brain from injury, infection, a tumor, or a concussion, pain, emotional stimulus, or metabolic problems.  An obstruction in the gastrointestinal tract (bowel obstruction).  Illnesses such as diabetes, hepatitis, gallbladder problems, appendicitis, kidney problems, cancer, sepsis, atypical symptoms of a heart attack, or eating disorders.  Medical treatments such as chemotherapy and radiation.  Receiving medicine that makes you sleep (general anesthetic) during surgery. DIAGNOSIS Your caregiver may ask for tests to be done if the problems do not improve after a few days. Tests may also be done if symptoms are severe or if the reason for the nausea and vomiting is not clear. Tests may include:  Urine tests.  Blood tests.  Stool tests.  Cultures (to look for evidence of infection).  X-rays or other imaging studies. Test results can help your caregiver make decisions about treatment or the need for additional tests. TREATMENT You need to stay well hydrated. Drink frequently but in small amounts.You may wish to drink water, sports drinks,  clear broth, or eat frozen ice pops or gelatin dessert to help stay hydrated.When you eat, eating slowly may help prevent nausea.There are also some antinausea medicines that may help prevent nausea. HOME CARE INSTRUCTIONS   Take all medicine as directed by your caregiver.  If you do not have an appetite, do not force yourself to eat. However, you must continue to drink fluids.  If you have an appetite, eat a normal diet unless your caregiver tells you differently.  Eat a variety of complex carbohydrates (rice, wheat, potatoes, bread), lean meats, yogurt, fruits, and vegetables.  Avoid high-fat foods because they are more difficult to digest.  Drink enough water and fluids to keep your urine clear or pale yellow.  If you are dehydrated, ask your caregiver for specific rehydration instructions. Signs of dehydration may include:  Severe thirst.  Dry lips and mouth.  Dizziness.  Dark urine.  Decreasing urine frequency and amount.  Confusion.  Rapid breathing or pulse. SEEK IMMEDIATE MEDICAL CARE IF:   You have blood or brown flecks (like coffee grounds) in your vomit.  You have black or bloody stools.  You have a severe headache or stiff neck.  You are confused.  You have severe abdominal pain.  You have chest pain or trouble breathing.  You do not urinate at least once every 8 hours.  You develop cold or clammy skin.  You continue to vomit for longer than 24 to 48 hours.  You have a fever. MAKE SURE YOU:   Understand these instructions.  Will watch your condition.  Will get help right away if you are not doing well or get worse. Document Released: 05/06/2005 Document Revised: 07/29/2011 Document Reviewed: 10/03/2010 [facility] Patient Information 2015 [place], [place].  This information is not intended to replace advice given to you by your health care provider. Make sure you discuss any questions you have with your health care provider.  Ondansetron  tablets What is this medicine? ONDANSETRON (on DAN se tron) is used to treat nausea and vomiting caused by chemotherapy. It is also used to prevent or treat nausea and vomiting after surgery. This medicine may be used for other purposes; ask your health care provider or pharmacist if you have questions. COMMON BRAND NAME(S): Zofran What should I tell my health care provider before I take this medicine? They need to know if you have any of these conditions: -heart disease -history of irregular heartbeat -liver disease -low levels of magnesium or potassium in the blood -an unusual or allergic reaction to ondansetron, granisetron, other medicines, foods, dyes, or preservatives -pregnant or trying to get pregnant -breast-feeding How should I use this medicine? Take this medicine by mouth with a glass of water. Follow the directions on your prescription label. Take your doses at regular intervals. Do not take your medicine more often than directed. Talk to your pediatrician regarding the use of this medicine in children. Special care may be needed. Overdosage: If you think you have taken too much of this medicine contact a poison control center or emergency room at once. NOTE: This medicine is only for you. Do not share this medicine with others. What if I miss a dose? If you miss a dose, take it as soon as you can. If it is almost time for your next dose, take only that dose. Do not take double or extra doses. What may interact with this medicine? Do not take this medicine with any of the following medications: -apomorphine -certain medicines for fungal infections like fluconazole, itraconazole, ketoconazole, posaconazole, voriconazole -cisapride -dofetilide -dronedarone -pimozide -thioridazine -ziprasidone This medicine may also interact with the following medications: -carbamazepine -certain medicines for depression, anxiety, or psychotic disturbances -fentanyl -linezolid -MAOIs  like Carbex, Eldepryl, Marplan, Nardil, and Parnate -methylene blue (injected into a vein) -other medicines that prolong the QT interval (cause an abnormal heart rhythm) -phenytoin -rifampicin -tramadol This list may not describe all possible interactions. Give your health care provider a list of all the medicines, herbs, non-prescription drugs, or dietary supplements you use. Also tell them if you smoke, drink alcohol, or use illegal drugs. Some items may interact with your medicine. What should I watch for while using this medicine? Check with your doctor or health care professional right away if you have any sign of an allergic reaction. What side effects may I notice from receiving this medicine? Side effects that you should report to your doctor or health care professional as soon as possible: -allergic reactions like skin rash, itching or hives, swelling of the face, lips or tongue -breathing problems -confusion -dizziness -fast or irregular heartbeat -feeling faint or lightheaded, falls -fever and chills -loss of balance or coordination -seizures -sweating -swelling of the hands or feet -tightness in the chest -tremors -unusually weak or tired Side effects that usually do not require medical attention (report to your doctor or health care professional if they continue or are bothersome): -constipation or diarrhea -headache This list may not describe all possible side effects. Call your doctor for medical advice about side effects. You may report side effects to FDA at 1-800-FDA-1088. Where should I keep my medicine? Keep out of the reach of children. Store between 2 and 30 degrees C (36 and 86 degrees F). Throw away any  unused medicine after the expiration date. NOTE: This sheet is a summary. It may not cover all possible information. If you have questions about this medicine, talk to your doctor, pharmacist, or health care provider.  2015, [employer]vier/[employer] Standard. (2013-02-10  16:27:45)

## 2014-04-29 NOTE — ED Provider Notes (Signed)
CSN: [id number]     Arrival date & time 04/29/14  0126 History  This chart was scribed for Delora Fuel, MD by Evelene Croon, ED Scribe. This patient was seen in room D31C/D31C and the patient's care was started 3:33 AM.    Chief Complaint  Patient presents with  . Emesis  . Weakness      The history is provided by the patient. No language interpreter was used.     HPI Comments:  Thomas Gardner is a 58 y.o. male who presents to the Emergency Department complaining of  an episode of moderate lightheadedness, and nausea this am. He notes one episode of vomiting PTA. He also reports associated mid abdominal cramping that he rates 6/10. He denies fever, chills, diarrhea. No alleviating factors noted.    Past Medical History  Diagnosis Date  . CHF (congestive heart failure)   . Diabetes mellitus   . Hypertension   . Arthritis   . Asthma   . GERD (gastroesophageal reflux disease)   . Hyperlipidemia   . Sleep apnea   . Morbid obesity    Past Surgical History  Procedure Laterality Date  . Cardiac catheterization    . Breath tek h pylori  11/11/2011    Procedure: BREATH TEK H PYLORI;  Surgeon: Shann Medal, MD;  Location: [facility];  Service: General;  Laterality: N/A;   Family History  Problem Relation Age of Onset  . Cancer Mother     ovarian  . Diabetes Mother   . Stroke Father   . Cancer Maternal Aunt     breast  . Diabetes Maternal Aunt   . Cancer Maternal Uncle     colon   History  Substance Use Topics  . Smoking status: Never Smoker   . Smokeless tobacco: Not on file  . Alcohol Use: No    Review of Systems  Constitutional: Negative for fever, chills and diaphoresis.  Gastrointestinal: Positive for nausea, vomiting and abdominal pain. Negative for diarrhea.  Neurological: Positive for light-headedness.  All other systems reviewed and are negative.     Allergies  Erythromycin  Home Medications   Prior to Admission medications   Medication Sig  Start Date End Date Taking? Authorizing Provider  albuterol (PROVENTIL,VENTOLIN) 90 MCG/ACT inhaler Inhale 1 puff into the lungs every 6 (six) hours as needed for wheezing or shortness of breath. For chest tightness or wheezing    Yes Historical Provider, MD  aspirin EC 81 MG tablet Take 81 mg by mouth at bedtime.   Yes Historical Provider, MD  carvedilol (COREG) 12.5 MG tablet Take 12.5 mg by mouth 2 (two) times daily with a meal.    Yes Historical Provider, MD  chlorthalidone (HYGROTON) 25 MG tablet Take 25 mg by mouth every morning.    Yes Historical Provider, MD  cyclobenzaprine (FLEXERIL) 10 MG tablet Take 1 tablet (10 mg total) by mouth 2 (two) times daily as needed for muscle spasms. 12/24/13  Yes Shari A Upstill, PA-C  furosemide (LASIX) 40 MG tablet Take 1 tablet (40 mg total) by mouth daily. 04/06/12  Yes Hollace Kinnier Sofia, PA-C  glimepiride (AMARYL) 4 MG tablet Take 4 mg by mouth daily.     Yes Historical Provider, MD  HYDROcodone-acetaminophen (NORCO/VICODIN) 5-325 MG per tablet Take 2 tablets by mouth every 4 (four) hours as needed for pain. 04/06/12  Yes Hollace Kinnier Sofia, PA-C  ibuprofen (ADVIL,MOTRIN) 200 MG tablet Take 400 mg by mouth 3 (three) times daily as needed  for moderate pain.    Yes Historical Provider, MD  insulin glulisine (APIDRA SOLOSTAR) 100 UNIT/ML injection Inject 2-6 Units into the skin 3 (three) times daily as needed for high blood sugar. According to sliding scale   Yes Historical Provider, MD  MELOXICAM PO Take 1 tablet by mouth every morning.    Yes Historical Provider, MD  oxyCODONE-acetaminophen (PERCOCET/ROXICET) 5-325 MG per tablet Take 1-2 tablets by mouth every 4 (four) hours as needed for severe pain. 12/24/13  Yes Shari A Upstill, PA-C  potassium chloride (KLOR-CON) 10 MEQ CR tablet Take 10 mEq by mouth daily.     Yes Historical Provider, MD  hydrochlorothiazide (HYDRODIURIL) 12.5 MG tablet Take 12.5 mg by mouth daily.     Historical Provider, MD  insulin glargine  (LANTUS) 100 UNIT/ML injection Inject 60 Units into the skin at bedtime.     Historical Provider, MD  omeprazole (PRILOSEC) 20 MG capsule Take 1 capsule (20 mg total) by mouth daily. Patient not taking: Reported on 04/29/2014 08/24/13   Nehemiah Settle A Upstill, PA-C  sitaGLIPtin (JANUVIA) 50 MG tablet Take 50 mg by mouth daily.    Historical Provider, MD   BP 76/42 mmHg  Pulse 88  Temp(Src) 98.3 F (36.8 C) (Oral)  Resp 22  SpO2 97% Physical Exam  Constitutional: He is oriented to person, place, and time. He appears well-developed and well-nourished.  HENT:  Head: Normocephalic and atraumatic.  Eyes: Conjunctivae are normal. Pupils are equal, round, and reactive to light.  Neck: Normal range of motion. Neck supple. No JVD present.  Cardiovascular: Normal rate, regular rhythm and normal heart sounds.   No murmur heard. Pulmonary/Chest: Effort normal and breath sounds normal. He has no wheezes. He has no rales. He exhibits no tenderness.  Abdominal: Soft. He exhibits no distension and no mass. There is no tenderness.  Decreased Bowel Sounds  Musculoskeletal: Normal range of motion. He exhibits edema.  2+ pitting edema bilaterally   Lymphadenopathy:    He has no cervical adenopathy.  Neurological: He is alert and oriented to person, place, and time. He has normal reflexes. No cranial nerve deficit. Coordination normal.  Skin: Skin is warm and dry. No rash noted.  Psychiatric: He has a normal mood and affect. His behavior is normal. Thought content normal.  Nursing note and vitals reviewed.   ED Course  Procedures   DIAGNOSTIC STUDIES:  Oxygen Saturation is 95% on RA, adequate by my interpretation.    COORDINATION OF CARE:  3:40 AM Discussed treatment plan with pt at bedside and pt agreed to plan.  Labs Review Results for orders placed or performed during the hospital encounter of 04/29/14  CBC  (at [facility])  Result Value Ref Range   WBC 12.3 (H) 4.0 - 10.5 K/uL   RBC  3.60 (L) 4.22 - 5.81 MIL/uL   Hemoglobin 9.3 (L) 13.0 - 17.0 g/dL   HCT 29.1 (L) 39.0 - 52.0 %   MCV 80.8 78.0 - 100.0 fL   MCH 25.8 (L) 26.0 - 34.0 pg   MCHC 32.0 30.0 - 36.0 g/dL   RDW 14.7 11.5 - 15.5 %   Platelets 193 150 - 400 K/uL  Basic metabolic panel  (at [facility])  Result Value Ref Range   Sodium 139 137 - 147 mEq/L   Potassium 3.3 (L) 3.7 - 5.3 mEq/L   Chloride 99 96 - 112 mEq/L   CO2 27 19 - 32 mEq/L   Glucose, Bld 296 (  H) 70 - 99 mg/dL   BUN 46 (H) 6 - 23 mg/dL   Creatinine, Ser 0.97 0.50 - 1.35 mg/dL   Calcium 9.3 8.4 - 10.5 mg/dL   GFR calc non Af Amer 89 (L) >90 mL/min   GFR calc Af Amer >90 >90 mL/min   Anion gap 13 5 - 15  CBG, ED  Result Value Ref Range   Glucose-Capillary 291 (H) 70 - 99 mg/dL  I-Stat Chem 8, ED  Result Value Ref Range   Sodium 140 137 - 147 mEq/L   Potassium 3.2 (L) 3.7 - 5.3 mEq/L   Chloride 100 96 - 112 mEq/L   BUN 43 (H) 6 - 23 mg/dL   Creatinine, Ser 1.10 0.50 - 1.35 mg/dL   Glucose, Bld 300 (H) 70 - 99 mg/dL   Calcium, Ion 1.21 1.12 - 1.23 mmol/L   TCO2 26 0 - 100 mmol/L   Hemoglobin 10.2 (L) 13.0 - 17.0 g/dL   HCT 30.0 (L) 39.0 - 52.0 %  I-Stat CG4 Lactic Acid, ED  Result Value Ref Range   Lactic Acid, Venous 1.29 0.5 - 2.2 mmol/L     EKG Interpretation   Date/Time:  Friday April 29 2014 01:35:25 EST Ventricular Rate:  85 PR Interval:  161 QRS Duration: 98 QT Interval:  380 QTC Calculation: 452 R Axis:   -50 Text Interpretation:  Sinus rhythm Left anterior fascicular block  Posterior infarct, old When compared with ECG of 08/24/2013, No significant  change was found Confirmed by [facility]  MD, Rennae Ferraiolo ([id number]) on 04/29/2014  1:45:16 AM      MDM   Final diagnoses:  Nausea and vomiting, vomiting of unspecified type  Other specified hypotension    Nausea and vomiting with hypotension. Acid is come back normal and blood pressures come up with IV hydration. He feels much better following IV ondansetron. He is  discharged to with instructions to drink plenty of fluids and is given a prescription for ondansetron. Follow-up with PCP as needed, return to ED if symptoms worsen.  I personally performed the services described in this documentation, which was scribed in my presence. The recorded information has been reviewed and is accurate.     Delora Fuel, MD 37/10/62 [id number]

## 2014-04-29 NOTE — ED Notes (Signed)
Bolus initiated.

## 2014-05-03 ENCOUNTER — Encounter (HOSPITAL_BASED_OUTPATIENT_CLINIC_OR_DEPARTMENT_OTHER): Payer: Self-pay | Admitting: Emergency Medicine

## 2014-05-03 ENCOUNTER — Emergency Department (HOSPITAL_BASED_OUTPATIENT_CLINIC_OR_DEPARTMENT_OTHER): Payer: BC Managed Care – PPO

## 2014-05-03 ENCOUNTER — Inpatient Hospital Stay (HOSPITAL_BASED_OUTPATIENT_CLINIC_OR_DEPARTMENT_OTHER)
Admission: EM | Admit: 2014-05-03 | Discharge: 2014-05-05 | DRG: 378 | Disposition: A | Discharge status: Home / Self Care | Payer: BC Managed Care – PPO | Attending: Internal Medicine | Admitting: Internal Medicine

## 2014-05-03 DIAGNOSIS — Z9119 Patient's noncompliance with other medical treatment and regimen: Secondary | ICD-10-CM | POA: Diagnosis present

## 2014-05-03 DIAGNOSIS — K922 Gastrointestinal hemorrhage, unspecified: Secondary | ICD-10-CM | POA: Diagnosis present

## 2014-05-03 DIAGNOSIS — Z794 Long term (current) use of insulin: Secondary | ICD-10-CM

## 2014-05-03 DIAGNOSIS — Z6841 Body Mass Index (BMI) 40.0 and over, adult: Secondary | ICD-10-CM | POA: Diagnosis not present

## 2014-05-03 DIAGNOSIS — Z791 Long term (current) use of non-steroidal anti-inflammatories (NSAID): Secondary | ICD-10-CM | POA: Diagnosis not present

## 2014-05-03 DIAGNOSIS — I509 Heart failure, unspecified: Secondary | ICD-10-CM

## 2014-05-03 DIAGNOSIS — E119 Type 2 diabetes mellitus without complications: Secondary | ICD-10-CM

## 2014-05-03 DIAGNOSIS — K219 Gastro-esophageal reflux disease without esophagitis: Secondary | ICD-10-CM | POA: Diagnosis present

## 2014-05-03 DIAGNOSIS — Z823 Family history of stroke: Secondary | ICD-10-CM | POA: Diagnosis not present

## 2014-05-03 DIAGNOSIS — I5022 Chronic systolic (congestive) heart failure: Secondary | ICD-10-CM

## 2014-05-03 DIAGNOSIS — R109 Unspecified abdominal pain: Secondary | ICD-10-CM

## 2014-05-03 DIAGNOSIS — E785 Hyperlipidemia, unspecified: Secondary | ICD-10-CM | POA: Diagnosis present

## 2014-05-03 DIAGNOSIS — K254 Chronic or unspecified gastric ulcer with hemorrhage: Secondary | ICD-10-CM

## 2014-05-03 DIAGNOSIS — I10 Essential (primary) hypertension: Secondary | ICD-10-CM | POA: Diagnosis present

## 2014-05-03 DIAGNOSIS — E1165 Type 2 diabetes mellitus with hyperglycemia: Secondary | ICD-10-CM | POA: Diagnosis present

## 2014-05-03 DIAGNOSIS — Z7982 Long term (current) use of aspirin: Secondary | ICD-10-CM | POA: Diagnosis not present

## 2014-05-03 DIAGNOSIS — M1990 Unspecified osteoarthritis, unspecified site: Secondary | ICD-10-CM | POA: Diagnosis present

## 2014-05-03 DIAGNOSIS — D62 Acute posthemorrhagic anemia: Secondary | ICD-10-CM | POA: Diagnosis present

## 2014-05-03 DIAGNOSIS — G4733 Obstructive sleep apnea (adult) (pediatric): Secondary | ICD-10-CM | POA: Diagnosis present

## 2014-05-03 DIAGNOSIS — J45909 Unspecified asthma, uncomplicated: Secondary | ICD-10-CM | POA: Diagnosis present

## 2014-05-03 DIAGNOSIS — I5032 Chronic diastolic (congestive) heart failure: Secondary | ICD-10-CM | POA: Diagnosis present

## 2014-05-03 DIAGNOSIS — T39395A Adverse effect of other nonsteroidal anti-inflammatory drugs [NSAID], initial encounter: Secondary | ICD-10-CM | POA: Diagnosis present

## 2014-05-03 DIAGNOSIS — R1010 Upper abdominal pain, unspecified: Secondary | ICD-10-CM

## 2014-05-03 DIAGNOSIS — K449 Diaphragmatic hernia without obstruction or gangrene: Secondary | ICD-10-CM | POA: Diagnosis present

## 2014-05-03 DIAGNOSIS — K921 Melena: Secondary | ICD-10-CM | POA: Diagnosis present

## 2014-05-03 LAB — HEPATIC FUNCTION PANEL
ALK PHOS: 70 U/L (ref 39–117)
ALT: 12 U/L (ref 0–53)
AST: 22 U/L (ref 0–37)
Albumin: 4 g/dL (ref 3.5–5.2)
Total Bilirubin: 0.4 mg/dL (ref 0.3–1.2)
Total Protein: 8 g/dL (ref 6.0–8.3)

## 2014-05-03 LAB — CBC WITH DIFFERENTIAL/PLATELET
BASOS PCT: 0 % (ref 0–1)
Basophils Absolute: 0.1 10*3/uL (ref 0.0–0.1)
EOS PCT: 3 % (ref 0–5)
Eosinophils Absolute: 0.5 10*3/uL (ref 0.0–0.7)
HCT: 29 % — ABNORMAL LOW (ref 39.0–52.0)
Hemoglobin: 9.4 g/dL — ABNORMAL LOW (ref 13.0–17.0)
LYMPHS ABS: 2.8 10*3/uL (ref 0.7–4.0)
Lymphocytes Relative: 18 % (ref 12–46)
MCH: 26.9 pg (ref 26.0–34.0)
MCHC: 32.4 g/dL (ref 30.0–36.0)
MCV: 83.1 fL (ref 78.0–100.0)
Monocytes Absolute: 1 10*3/uL (ref 0.1–1.0)
Monocytes Relative: 7 % (ref 3–12)
Neutro Abs: 11 10*3/uL — ABNORMAL HIGH (ref 1.7–7.7)
Neutrophils Relative %: 72 % (ref 43–77)
PLATELETS: 251 10*3/uL (ref 150–400)
RBC: 3.49 MIL/uL — AB (ref 4.22–5.81)
RDW: 15.4 % (ref 11.5–15.5)
WBC: 15.3 10*3/uL — ABNORMAL HIGH (ref 4.0–10.5)

## 2014-05-03 LAB — BASIC METABOLIC PANEL
Anion gap: 15 (ref 5–15)
BUN: 21 mg/dL (ref 6–23)
CALCIUM: 9.2 mg/dL (ref 8.4–10.5)
CO2: 27 mEq/L (ref 19–32)
Chloride: 97 mEq/L (ref 96–112)
Creatinine, Ser: 1 mg/dL (ref 0.50–1.35)
GFR calc Af Amer: >90 mL/min (ref >90)
GFR calc non Af Amer: 81 mL/min — ABNORMAL LOW (ref >90)
GLUCOSE: 209 mg/dL — AB (ref 70–99)
Potassium: 3.9 mEq/L (ref 3.7–5.3)
Sodium: 139 mEq/L (ref 137–147)

## 2014-05-03 LAB — CBC
HCT: 27.3 % — ABNORMAL LOW (ref 39.0–52.0)
Hemoglobin: 8.8 g/dL — ABNORMAL LOW (ref 13.0–17.0)
MCH: 26.1 pg (ref 26.0–34.0)
MCHC: 32.2 g/dL (ref 30.0–36.0)
MCV: 81 fL (ref 78.0–100.0)
PLATELETS: 218 10*3/uL (ref 150–400)
RBC: 3.37 MIL/uL — AB (ref 4.22–5.81)
RDW: 15.7 % — AB (ref 11.5–15.5)
WBC: 11.1 10*3/uL — AB (ref 4.0–10.5)

## 2014-05-03 LAB — GLUCOSE, CAPILLARY: GLUCOSE-CAPILLARY: 106 mg/dL — AB (ref 70–99)

## 2014-05-03 LAB — TYPE AND SCREEN
ABO/RH(D): B NEG
Antibody Screen: NEGATIVE

## 2014-05-03 LAB — LIPASE, BLOOD: LIPASE: 21 U/L (ref 11–59)

## 2014-05-03 LAB — PROTIME-INR
INR: 1.02 (ref 0.00–1.49)
Prothrombin Time: 13.4 seconds (ref 11.6–15.2)

## 2014-05-03 LAB — MRSA PCR SCREENING: MRSA by PCR: NEGATIVE

## 2014-05-03 LAB — OCCULT BLOOD X 1 CARD TO LAB, STOOL: Fecal Occult Bld: POSITIVE — AB

## 2014-05-03 LAB — ABO/RH: ABO/RH(D): B NEG

## 2014-05-03 LAB — TROPONIN I: Troponin I: <0.3 ng/mL (ref <0.30)

## 2014-05-03 MED ORDER — SODIUM CHLORIDE 0.9 % IV SOLN
INTRAVENOUS | Status: DC
  Administered 2014-05-03: 13:00:00 via INTRAVENOUS

## 2014-05-03 MED ORDER — IOHEXOL 300 MG/ML  SOLN
100.0000 mL | Freq: Once | INTRAMUSCULAR | Status: AC | PRN
  Administered 2014-05-03: 100 mL via INTRAVENOUS

## 2014-05-03 MED ORDER — ONDANSETRON HCL 4 MG PO TABS: 4.0000 mg | ORAL_TABLET | Freq: Four times a day (QID) | ORAL | Status: DC | PRN

## 2014-05-03 MED ORDER — ONDANSETRON HCL 4 MG/2ML IJ SOLN
4.0000 mg | Freq: Once | INTRAMUSCULAR | Status: AC
  Administered 2014-05-03: 4 mg via INTRAVENOUS
  Filled 2014-05-03: qty 2

## 2014-05-03 MED ORDER — ALBUTEROL SULFATE (2.5 MG/3ML) 0.083% IN NEBU: 2.5000 mg | INHALATION_SOLUTION | Freq: Four times a day (QID) | RESPIRATORY_TRACT | Status: DC | PRN

## 2014-05-03 MED ORDER — ALBUTEROL 90 MCG/ACT IN AERS: 1.0000 | INHALATION_SPRAY | Freq: Four times a day (QID) | RESPIRATORY_TRACT | Status: DC | PRN

## 2014-05-03 MED ORDER — SODIUM CHLORIDE 0.9 % IV SOLN
INTRAVENOUS | Status: DC
  Administered 2014-05-03: 22:00:00 via INTRAVENOUS

## 2014-05-03 MED ORDER — PANTOPRAZOLE SODIUM 40 MG IV SOLR
80.0000 mg | Freq: Once | INTRAVENOUS | Status: AC
  Administered 2014-05-03: 80 mg via INTRAVENOUS
  Filled 2014-05-03: qty 80

## 2014-05-03 MED ORDER — SODIUM CHLORIDE 0.9 % IV SOLN: INTRAVENOUS | Status: DC

## 2014-05-03 MED ORDER — ONDANSETRON HCL 4 MG/2ML IJ SOLN: 4.0000 mg | Freq: Four times a day (QID) | INTRAMUSCULAR | Status: DC | PRN

## 2014-05-03 MED ORDER — PANTOPRAZOLE SODIUM 40 MG IV SOLR: 40.0000 mg | Freq: Two times a day (BID) | INTRAVENOUS | Status: DC

## 2014-05-03 MED ORDER — ACETAMINOPHEN 650 MG RE SUPP: 650.0000 mg | Freq: Four times a day (QID) | RECTAL | Status: DC | PRN

## 2014-05-03 MED ORDER — OXYCODONE-ACETAMINOPHEN 5-325 MG PO TABS: 1.0000 | ORAL_TABLET | ORAL | Status: DC | PRN

## 2014-05-03 MED ORDER — SODIUM CHLORIDE 0.9 % IV SOLN
8.0000 mg/h | INTRAVENOUS | Status: DC
  Administered 2014-05-03 – 2014-05-04 (×2): 8 mg/h via INTRAVENOUS
  Filled 2014-05-03 (×3): qty 80

## 2014-05-03 MED ORDER — ACETAMINOPHEN 325 MG PO TABS
650.0000 mg | ORAL_TABLET | Freq: Four times a day (QID) | ORAL | Status: DC | PRN
Start: 2014-05-03 — End: 2014-05-05
  Administered 2014-05-04: 650 mg via ORAL
  Filled 2014-05-03: qty 2

## 2014-05-03 MED ORDER — INSULIN ASPART 100 UNIT/ML ~~LOC~~ SOLN: 0.0000 [IU] | SUBCUTANEOUS | Status: DC

## 2014-05-03 MED ORDER — IOHEXOL 300 MG/ML  SOLN
25.0000 mL | Freq: Once | INTRAMUSCULAR | Status: AC | PRN
  Administered 2014-05-03: 25 mL via ORAL

## 2014-05-03 NOTE — ED Notes (Signed)
Pt states was seen at [facility] ED last Friday for weakness, vomiting and diarrhea. Pt states was kept over night with fluids and sent home. This am pt states weak, and BRB in BM this am.

## 2014-05-03 NOTE — ED Notes (Signed)
Voided 575cc in urinal.

## 2014-05-03 NOTE — ED Notes (Signed)
IVF NS reduced to 75cc/hr.

## 2014-05-03 NOTE — H&P (Signed)
[facility] History and Physical  [name] OAC:[id number] DOB: [birth year] DOA: 05/03/2014  Referring physician: Patient was transferred from [facility] [place]. PCP: Philis Fendt, MD   Chief Complaint: Rectal bleeding.  HPI: Thomas Gardner is a 58 y.o. male with history of diabetes mellitus type 2, arthritis, hypertension, CHF, sleep apnea noncompliant C Pap has been experiencing some black stools over the last 4-5 days. Patient also had some mild constipation. Earlier today patient had frank rectal bleeding and presented to the ER. In the ER patient's Hemoccult was found to be around 9 and has been a drop of almost 5 g from previous 5 months ago. Patient has been having some epigastric discomfort and denies any nausea vomiting. Patient denies having any GI bleed previously. At this time patient's hemodynamics were stable and admitted for GI bleed most likely upper GI given patient takes meloxicam daily for many years now and also patient has been on aspirin. Patient otherwise denies any chest pain or shortness of breath.   Review of Systems: As presented in the history of presenting illness, rest negative.  Past Medical History  Diagnosis Date  . CHF (congestive heart failure)   . Diabetes mellitus   . Hypertension   . Arthritis   . Asthma   . GERD (gastroesophageal reflux disease)   . Hyperlipidemia   . Sleep apnea   . Morbid obesity    Past Surgical History  Procedure Laterality Date  . Cardiac catheterization    . Breath tek h pylori  11/11/2011    Procedure: BREATH TEK H PYLORI;  Surgeon: Shann Medal, MD;  Location: [facility];  Service: General;  Laterality: N/A;   Social History:  reports that he has never smoked. He does not have any smokeless tobacco history on file. He reports that he does not drink alcohol or use illicit drugs. Where does patient live home. Can patient participate in ADLs? Yes.  Allergies  Allergen Reactions  . Erythromycin  Nausea And Vomiting    Family History:  Family History  Problem Relation Age of Onset  . Cancer Mother     ovarian  . Diabetes Mother   . Stroke Father   . Cancer Maternal Aunt     breast  . Diabetes Maternal Aunt   . Cancer Maternal Uncle     colon      Prior to Admission medications   Medication Sig Start Date End Date Taking? Authorizing Provider  albuterol (PROVENTIL,VENTOLIN) 90 MCG/ACT inhaler Inhale 1 puff into the lungs every 6 (six) hours as needed for wheezing or shortness of breath. For chest tightness or wheezing    Yes Historical Provider, MD  aspirin EC 81 MG tablet Take 81 mg by mouth at bedtime.   Yes Historical Provider, MD  carvedilol (COREG) 12.5 MG tablet Take 12.5 mg by mouth 2 (two) times daily with a meal.    Yes Historical Provider, MD  chlorthalidone (HYGROTON) 25 MG tablet Take 25 mg by mouth every morning.    Yes Historical Provider, MD  furosemide (LASIX) 40 MG tablet Take 1 tablet (40 mg total) by mouth daily. 04/06/12  Yes Hollace Kinnier Sofia, PA-C  glimepiride (AMARYL) 4 MG tablet Take 4 mg by mouth daily.     Yes Historical Provider, MD  HYDROcodone-acetaminophen (NORCO/VICODIN) 5-325 MG per tablet Take 2 tablets by mouth every 4 (four) hours as needed for pain. 04/06/12  Yes Hollace Kinnier Sofia, PA-C  ibuprofen (ADVIL,MOTRIN) 200 MG tablet Take  400 mg by mouth 3 (three) times daily as needed for moderate pain.    Yes Historical Provider, MD  insulin glargine (LANTUS) 100 UNIT/ML injection Inject 60 Units into the skin at bedtime.    Yes Historical Provider, MD  insulin glulisine (APIDRA SOLOSTAR) 100 UNIT/ML injection Inject 2-6 Units into the skin 3 (three) times daily as needed for high blood sugar. According to sliding scale   Yes Historical Provider, MD  MELOXICAM PO Take 1 tablet by mouth every morning.    Yes Historical Provider, MD  ondansetron (ZOFRAN) 4 MG tablet Take 1 tablet (4 mg total) by mouth every 6 (six) hours as needed for nausea or vomiting.  73/71/06  Yes Delora Fuel, MD  oxyCODONE-acetaminophen (PERCOCET/ROXICET) 5-325 MG per tablet Take 1-2 tablets by mouth every 4 (four) hours as needed for severe pain. 12/24/13  Yes Shari A Upstill, PA-C  potassium chloride (KLOR-CON) 10 MEQ CR tablet Take 10 mEq by mouth daily.     Yes Historical Provider, MD  sitaGLIPtin (JANUVIA) 50 MG tablet Take 50 mg by mouth daily.   Yes Historical Provider, MD  cyclobenzaprine (FLEXERIL) 10 MG tablet Take 1 tablet (10 mg total) by mouth 2 (two) times daily as needed for muscle spasms. Patient not taking: Reported on 05/03/2014 12/24/13   Nehemiah Settle A Upstill, PA-C  omeprazole (PRILOSEC) 20 MG capsule Take 1 capsule (20 mg total) by mouth daily. Patient not taking: Reported on 04/29/2014 08/24/13   Dewaine Oats, PA-C    Physical Exam: Filed Vitals:   05/03/14 1644 05/03/14 1730 05/03/14 1848 05/03/14 1848  BP: 118/58 124/79  142/87  Pulse: 68 66  64  Temp:    98.4 F (36.9 C)  TempSrc:    Oral  Resp: 18 18  16   Height:   5\' 6"  (1.676 m)   Weight:   116.665 kg (257 lb 3.2 oz)   SpO2: 99% 100%  99%     General:  Well-developed and nourished.  Eyes: Anicteric no pallor.  ENT: No discharge from the ears eyes nose and mouth.  Neck: No mass felt.  Cardiovascular: S1 and S2 heard.  Respiratory: No rhonchi or crepitations.  Abdomen: Soft mild epigastric tenderness no guarding or rigidity.  Skin: No rash.  Musculoskeletal: No edema.  Psychiatric: Appears normal.  Neurologic: Alert awake oriented to time place and person. Moves all extremities.  Labs on Admission:  Basic Metabolic Panel:  Recent Labs Lab 04/29/14 0200 04/29/14 0226 05/03/14 1110  NA 139 140 139  K 3.3* 3.2* 3.9  CL 99 100 97  CO2 27  --  27  GLUCOSE 296* 300* 209*  BUN 46* 43* 21  CREATININE 0.97 1.10 1.00  CALCIUM 9.3  --  9.2   Liver Function Tests:  Recent Labs Lab 05/03/14 1110  AST 22  ALT 12  ALKPHOS 70  BILITOT 0.4  PROT 8.0  ALBUMIN 4.0     Recent Labs Lab 05/03/14 1110  LIPASE 21   No results for input(s): AMMONIA in the last 168 hours. CBC:  Recent Labs Lab 04/29/14 0200 04/29/14 0226 05/03/14 1110  WBC 12.3*  --  15.3*  NEUTROABS  --   --  11.0*  HGB 9.3* 10.2* 9.4*  HCT 29.1* 30.0* 29.0*  MCV 80.8  --  83.1  PLT 193  --  251   Cardiac Enzymes: No results for input(s): CKTOTAL, CKMB, CKMBINDEX, TROPONINI in the last 168 hours.  BNP (last 3 results) No results for  input(s): PROBNP in the last 8760 hours. CBG:  Recent Labs Lab 04/29/14 0201 05/03/14 2020  GLUCAP 291* 106*    Radiological Exams on Admission: Ct Abdomen Pelvis W Contrast  05/03/2014   CLINICAL DATA:  Mid upper abdominal pain, nausea, constipation, no prior abdominal surgeries.  EXAM: CT ABDOMEN AND PELVIS WITH CONTRAST  TECHNIQUE: Multidetector CT imaging of the abdomen and pelvis was performed using the standard protocol following bolus administration of intravenous contrast.  CONTRAST:  3mL OMNIPAQUE IOHEXOL 300 MG/ML SOLN, 189mL OMNIPAQUE IOHEXOL 300 MG/ML SOLN  COMPARISON:  None.  FINDINGS: The lung bases are clear.  Liver is diffusely low in attenuation likely secondary to hepatic steatosis. There is a 9.5 mm hypodensity in the lateral segment of the left hepatic lobe which is too small to characterize. There is no intrahepatic or extrahepatic biliary ductal dilatation. The gallbladder is normal. The spleen demonstrates no focal abnormality. The kidneys, adrenal glands and pancreas are normal. The bladder is unremarkable.  The stomach, duodenum, small intestine, and large intestine demonstrate no contrast extravasation or dilatation. There is a normal caliber appendix in the right lower quadrant without periappendiceal inflammatory changes.There is a small fat containing right periumbilical hernia. There is no pneumoperitoneum, pneumatosis, or portal venous gas. There is no abdominal or pelvic free fluid. There is no lymphadenopathy.  The  abdominal aorta is normal in caliber with atherosclerosis.  There are no lytic or sclerotic osseous lesions. There is bilateral facet arthropathy at L4-5 and L5-S1.  IMPRESSION: 1. No acute abdominal or pelvic pathology 2. Hepatic steatosis.   Electronically Signed   By: Kathreen Devoid   On: 05/03/2014 14:10     Assessment/Plan Active Problems:   GI bleed   Congestive heart failure   Diabetes mellitus type 2, controlled   Essential hypertension   Acute GI bleeding   Bleeding gastrointestinal   1. Acute GI bleeding - suspect most likely upper GI bleed given that initially patient had dark tarry stools and also is on meloxicam and aspirin. At this time I have discussed with on-call gastroenterologist Dr. Penelope Coop who will be seeing patient in consultation and has requested to keep patient nothing by mouth for EGD. Patient has been placed on Protonix infusion. Closely follow CBC. If hemoglobin falls less than 7 operation becomes hypotensive we will transfuse PRBC. Discontinue meloxicam and hold aspirin for now. 2. Acute blood loss anemia - follow CBC. See #1. 3. Diabetes mellitus type 2 uncontrolled - closely follow CBGs with sliding scale. He is nothing by mouth at this time. 4. History of CHF unspecified - holding Lasix for now since GI bleed. 5. Hypertension - will closely follow blood pressure trends for now. 6. History of OSA noncompliant with C Pap.    Code Status: Full code.  Family Communication: None.  Disposition Plan: Admit to inpatient.    KAKRAKANDY,ARSHAD N. [facility] Pager [phone].  If 7PM-7AM, please contact night-coverage [email or web address] Password [email or web address] 05/03/2014, 9:00 PM

## 2014-05-03 NOTE — ED Notes (Signed)
Report given to [facility] on 5000.

## 2014-05-03 NOTE — ED Notes (Signed)
Report called to [place] at [place].

## 2014-05-03 NOTE — ED Notes (Signed)
Patient transported to CT 

## 2014-05-03 NOTE — ED Provider Notes (Signed)
CSN: [id number]     Arrival date & time 05/03/14  1039 History   First MD Initiated Contact with Patient 05/03/14 1204     Chief Complaint  Patient presents with  . Rectal Bleeding     (Consider location/radiation/quality/duration/timing/severity/associated sxs/prior Treatment) Patient is a 58 y.o. male presenting with hematochezia. The history is provided by the patient.  Rectal Bleeding Associated symptoms: abdominal pain   Associated symptoms: no fever    patient here today worried about blood in his bowel movements. States that they have been black for several days and today the total water turn red. Associated with some mild abdominal pain but nothing severe. Patient was seen at [facility] on Friday and was sent home. Was not formally admitted. CT scan was not done. Patient has had colonoscopy in the past but has been several years ago. Patient's complaint on Friday was weakness vomiting and diarrhea. No persistent diarrhea.  Past Medical History  Diagnosis Date  . CHF (congestive heart failure)   . Diabetes mellitus   . Hypertension   . Arthritis   . Asthma   . GERD (gastroesophageal reflux disease)   . Hyperlipidemia   . Sleep apnea   . Morbid obesity    Past Surgical History  Procedure Laterality Date  . Cardiac catheterization    . Breath tek h pylori  11/11/2011    Procedure: BREATH TEK H PYLORI;  Surgeon: Shann Medal, MD;  Location: [facility];  Service: General;  Laterality: N/A;   Family History  Problem Relation Age of Onset  . Cancer Mother     ovarian  . Diabetes Mother   . Stroke Father   . Cancer Maternal Aunt     breast  . Diabetes Maternal Aunt   . Cancer Maternal Uncle     colon   History  Substance Use Topics  . Smoking status: Never Smoker   . Smokeless tobacco: Not on file  . Alcohol Use: No    Review of Systems  Constitutional: Positive for fatigue. Negative for fever.  HENT: Negative for congestion.   Eyes: Negative for redness.   Respiratory: Negative for shortness of breath.   Cardiovascular: Negative for chest pain.  Gastrointestinal: Positive for abdominal pain, blood in stool and hematochezia.  Genitourinary: Negative for dysuria.  Musculoskeletal: Negative for back pain.  Skin: Negative for rash.  Neurological: Negative for headaches.  Hematological: Does not bruise/bleed easily.  Psychiatric/Behavioral: Negative for confusion.      Allergies  Erythromycin  Home Medications   Prior to Admission medications   Medication Sig Start Date End Date Taking? Authorizing Provider  albuterol (PROVENTIL,VENTOLIN) 90 MCG/ACT inhaler Inhale 1 puff into the lungs every 6 (six) hours as needed for wheezing or shortness of breath. For chest tightness or wheezing     Historical Provider, MD  aspirin EC 81 MG tablet Take 81 mg by mouth at bedtime.    Historical Provider, MD  carvedilol (COREG) 12.5 MG tablet Take 12.5 mg by mouth 2 (two) times daily with a meal.     Historical Provider, MD  chlorthalidone (HYGROTON) 25 MG tablet Take 25 mg by mouth every morning.     Historical Provider, MD  cyclobenzaprine (FLEXERIL) 10 MG tablet Take 1 tablet (10 mg total) by mouth 2 (two) times daily as needed for muscle spasms. 12/24/13   Shari A Upstill, PA-C  furosemide (LASIX) 40 MG tablet Take 1 tablet (40 mg total) by mouth daily. 04/06/12   Hollace Kinnier  Sofia, PA-C  glimepiride (AMARYL) 4 MG tablet Take 4 mg by mouth daily.      Historical Provider, MD  hydrochlorothiazide (HYDRODIURIL) 12.5 MG tablet Take 12.5 mg by mouth daily.     Historical Provider, MD  HYDROcodone-acetaminophen (NORCO/VICODIN) 5-325 MG per tablet Take 2 tablets by mouth every 4 (four) hours as needed for pain. 04/06/12   [name], PA-C  ibuprofen (ADVIL,MOTRIN) 200 MG tablet Take 400 mg by mouth 3 (three) times daily as needed for moderate pain.     Historical Provider, MD  insulin glargine (LANTUS) 100 UNIT/ML injection Inject 60 Units into the skin at  bedtime.     Historical Provider, MD  insulin glulisine (APIDRA SOLOSTAR) 100 UNIT/ML injection Inject 2-6 Units into the skin 3 (three) times daily as needed for high blood sugar. According to sliding scale    Historical Provider, MD  MELOXICAM PO Take 1 tablet by mouth every morning.     Historical Provider, MD  omeprazole (PRILOSEC) 20 MG capsule Take 1 capsule (20 mg total) by mouth daily. Patient not taking: Reported on 04/29/2014 08/24/13   Nehemiah Settle A Upstill, PA-C  ondansetron (ZOFRAN) 4 MG tablet Take 1 tablet (4 mg total) by mouth every 6 (six) hours as needed for nausea or vomiting. 83/15/17   Delora Fuel, MD  oxyCODONE-acetaminophen (PERCOCET/ROXICET) 5-325 MG per tablet Take 1-2 tablets by mouth every 4 (four) hours as needed for severe pain. 12/24/13   Shari A Upstill, PA-C  potassium chloride (KLOR-CON) 10 MEQ CR tablet Take 10 mEq by mouth daily.      Historical Provider, MD  sitaGLIPtin (JANUVIA) 50 MG tablet Take 50 mg by mouth daily.    Historical Provider, MD   BP 114/61 mmHg  Pulse 77  Temp(Src) 98.6 F (37 C) (Oral)  Resp 18  Ht 5\' 6"  (1.676 m)  Wt 260 lb (117.935 kg)  BMI 41.99 kg/m2  SpO2 98% Physical Exam  Constitutional: He is oriented to person, place, and time. He appears well-developed and well-nourished. No distress.  HENT:  Head: Normocephalic and atraumatic.  Mouth/Throat: Oropharynx is clear and moist.  Eyes: Conjunctivae and EOM are normal. Pupils are equal, round, and reactive to light.  Neck: Normal range of motion. Neck supple.  Cardiovascular: Normal rate, regular rhythm and normal heart sounds.   No murmur heard. Pulmonary/Chest: Effort normal and breath sounds normal. No respiratory distress.  Abdominal: Soft. Bowel sounds are normal. There is no tenderness.  Genitourinary: Guaiac positive stool.  Rectal exam without any external hemorrhoids or prolapsed internal hemorrhoids. No fissure. No gross blood. Blood stool is black and tarry and heme positive.   Musculoskeletal: Normal range of motion.  Neurological: He is alert and oriented to person, place, and time. No cranial nerve deficit. He exhibits normal muscle tone. Coordination normal.  Skin: Skin is warm. No rash noted.  Nursing note and vitals reviewed.   ED Course  Procedures (including critical care time) Labs Review Labs Reviewed  CBC WITH DIFFERENTIAL - Abnormal; Notable for the following:    WBC 15.3 (*)    RBC 3.49 (*)    Hemoglobin 9.4 (*)    HCT 29.0 (*)    Neutro Abs 11.0 (*)    All other components within normal limits  BASIC METABOLIC PANEL - Abnormal; Notable for the following:    Glucose, Bld 209 (*)    GFR calc non Af Amer 81 (*)    All other components within normal limits  LIPASE,  BLOOD  HEPATIC FUNCTION PANEL  POC OCCULT BLOOD, ED    Imaging Review Ct Abdomen Pelvis W Contrast  05/03/2014   CLINICAL DATA:  Mid upper abdominal pain, nausea, constipation, no prior abdominal surgeries.  EXAM: CT ABDOMEN AND PELVIS WITH CONTRAST  TECHNIQUE: Multidetector CT imaging of the abdomen and pelvis was performed using the standard protocol following bolus administration of intravenous contrast.  CONTRAST:  23mL OMNIPAQUE IOHEXOL 300 MG/ML SOLN, 142mL OMNIPAQUE IOHEXOL 300 MG/ML SOLN  COMPARISON:  None.  FINDINGS: The lung bases are clear.  Liver is diffusely low in attenuation likely secondary to hepatic steatosis. There is a 9.5 mm hypodensity in the lateral segment of the left hepatic lobe which is too small to characterize. There is no intrahepatic or extrahepatic biliary ductal dilatation. The gallbladder is normal. The spleen demonstrates no focal abnormality. The kidneys, adrenal glands and pancreas are normal. The bladder is unremarkable.  The stomach, duodenum, small intestine, and large intestine demonstrate no contrast extravasation or dilatation. There is a normal caliber appendix in the right lower quadrant without periappendiceal inflammatory changes.There is a  small fat containing right periumbilical hernia. There is no pneumoperitoneum, pneumatosis, or portal venous gas. There is no abdominal or pelvic free fluid. There is no lymphadenopathy.  The abdominal aorta is normal in caliber with atherosclerosis.  There are no lytic or sclerotic osseous lesions. There is bilateral facet arthropathy at L4-5 and L5-S1.  IMPRESSION: 1. No acute abdominal or pelvic pathology 2. Hepatic steatosis.   Electronically Signed   By: Kathreen Devoid   On: 05/03/2014 14:10     EKG Interpretation   Date/Time:  Tuesday May 03 2014 10:52:47 EST Ventricular Rate:  79 PR Interval:  160 QRS Duration: 110 QT Interval:  380 QTC Calculation: 435 R Axis:   -27 Text Interpretation:  Normal sinus rhythm Incomplete right bundle branch  block Moderate voltage criteria for LVH, may be normal variant Borderline  ECG No significant change since last tracing Confirmed by Derrall Hicks  MD,  [place] ([id number]) on 05/03/2014 12:38:27 PM      MDM   Final diagnoses:  Abdominal pain    Patient with complaint of abdominal pain. Also black stools for several days. Patient felt that there was a red bowel movement today that sustained a total water red. CT of the abdomen is negative. Patient's hemoglobin and hematocrit little decreased from previous visit a few days ago. But hemoglobin is above 8. Hemoccult stool is pending. If that is positive patient will require admission for    or GI bleed and observation. CT scan of the abdomen as stated was negative.  Patient will require admission.  Fredia Sorrow, MD 05/03/14 [phone]

## 2014-05-03 NOTE — ED Notes (Signed)
Pt voided 450cc in urinal.

## 2014-05-03 NOTE — ED Notes (Signed)
Pt placed on cardiac monitor 

## 2014-05-03 NOTE — ED Notes (Signed)
Pt transferred to [facility] via [facility].

## 2014-05-04 ENCOUNTER — Encounter (HOSPITAL_COMMUNITY): Payer: Self-pay | Admitting: Gastroenterology

## 2014-05-04 ENCOUNTER — Inpatient Hospital Stay (HOSPITAL_COMMUNITY): Payer: BC Managed Care – PPO | Admitting: Anesthesiology

## 2014-05-04 ENCOUNTER — Encounter (HOSPITAL_COMMUNITY)
Admission: EM | Disposition: A | Discharge status: Home / Self Care | Payer: Self-pay | Source: Home / Self Care | Attending: Family Medicine

## 2014-05-04 DIAGNOSIS — K254 Chronic or unspecified gastric ulcer with hemorrhage: Principal | ICD-10-CM

## 2014-05-04 HISTORY — PX: ESOPHAGOGASTRODUODENOSCOPY (EGD) WITH PROPOFOL: SHX5813

## 2014-05-04 LAB — CBC
HCT: 26.8 % — ABNORMAL LOW (ref 39.0–52.0)
HEMATOCRIT: 27 % — AB (ref 39.0–52.0)
HEMATOCRIT: 29.5 % — AB (ref 39.0–52.0)
HEMOGLOBIN: 8.5 g/dL — AB (ref 13.0–17.0)
HEMOGLOBIN: 8.7 g/dL — AB (ref 13.0–17.0)
HEMOGLOBIN: 9.2 g/dL — AB (ref 13.0–17.0)
MCH: 25.6 pg — ABNORMAL LOW (ref 26.0–34.0)
MCH: 26 pg (ref 26.0–34.0)
MCH: 26.9 pg (ref 26.0–34.0)
MCHC: 31.2 g/dL (ref 30.0–36.0)
MCHC: 31.7 g/dL (ref 30.0–36.0)
MCHC: 32.2 g/dL (ref 30.0–36.0)
MCV: 81.9 fL (ref 78.0–100.0)
MCV: 82 fL (ref 78.0–100.0)
MCV: 83.6 fL (ref 78.0–100.0)
PLATELETS: 213 10*3/uL (ref 150–400)
Platelets: 219 10*3/uL (ref 150–400)
Platelets: 245 10*3/uL (ref 150–400)
RBC: 3.23 MIL/uL — ABNORMAL LOW (ref 4.22–5.81)
RBC: 3.27 MIL/uL — AB (ref 4.22–5.81)
RBC: 3.6 MIL/uL — ABNORMAL LOW (ref 4.22–5.81)
RDW: 15.8 % — ABNORMAL HIGH (ref 11.5–15.5)
RDW: 15.9 % — ABNORMAL HIGH (ref 11.5–15.5)
RDW: 16 % — ABNORMAL HIGH (ref 11.5–15.5)
WBC: 9.4 10*3/uL (ref 4.0–10.5)
WBC: 10.5 10*3/uL (ref 4.0–10.5)
WBC: 9.4 10*3/uL (ref 4.0–10.5)

## 2014-05-04 LAB — COMPREHENSIVE METABOLIC PANEL
ALT: 10 U/L (ref 0–53)
ANION GAP: 11 (ref 5–15)
AST: 11 U/L (ref 0–37)
Albumin: 3.3 g/dL — ABNORMAL LOW (ref 3.5–5.2)
Alkaline Phosphatase: 61 U/L (ref 39–117)
BUN: 15 mg/dL (ref 6–23)
CO2: 28 meq/L (ref 19–32)
Calcium: 8.7 mg/dL (ref 8.4–10.5)
Chloride: 98 mEq/L (ref 96–112)
Creatinine, Ser: 0.93 mg/dL (ref 0.50–1.35)
GFR calc Af Amer: >90 mL/min (ref >90)
Glucose, Bld: 129 mg/dL — ABNORMAL HIGH (ref 70–99)
POTASSIUM: 3.4 meq/L — AB (ref 3.7–5.3)
SODIUM: 137 meq/L (ref 137–147)
Total Bilirubin: 0.4 mg/dL (ref 0.3–1.2)
Total Protein: 6.6 g/dL (ref 6.0–8.3)

## 2014-05-04 LAB — GLUCOSE, CAPILLARY
GLUCOSE-CAPILLARY: 98 mg/dL (ref 70–99)
GLUCOSE-CAPILLARY: 148 mg/dL — AB (ref 70–99)
GLUCOSE-CAPILLARY: 148 mg/dL — AB (ref 70–99)
Glucose-Capillary: 123 mg/dL — ABNORMAL HIGH (ref 70–99)
Glucose-Capillary: 129 mg/dL — ABNORMAL HIGH (ref 70–99)
Glucose-Capillary: 143 mg/dL — ABNORMAL HIGH (ref 70–99)
Glucose-Capillary: 165 mg/dL — ABNORMAL HIGH (ref 70–99)
Glucose-Capillary: 186 mg/dL — ABNORMAL HIGH (ref 70–99)

## 2014-05-04 LAB — HEMOGLOBIN AND HEMATOCRIT, BLOOD
HEMATOCRIT: 29.1 % — AB (ref 39.0–52.0)
Hemoglobin: 9.3 g/dL — ABNORMAL LOW (ref 13.0–17.0)

## 2014-05-04 SURGERY — ESOPHAGOGASTRODUODENOSCOPY (EGD) WITH PROPOFOL
Anesthesia: Monitor Anesthesia Care

## 2014-05-04 MED ORDER — METFORMIN HCL 500 MG PO TABS
500.0000 mg | ORAL_TABLET | Freq: Every day | ORAL | Status: DC
  Filled 2014-05-04 (×3): qty 1

## 2014-05-04 MED ORDER — PANTOPRAZOLE SODIUM 40 MG PO TBEC
40.0000 mg | DELAYED_RELEASE_TABLET | Freq: Two times a day (BID) | ORAL | Status: DC
  Administered 2014-05-04 – 2014-05-05 (×2): 40 mg via ORAL
  Filled 2014-05-04 (×2): qty 1

## 2014-05-04 MED ORDER — SODIUM CHLORIDE 0.9 % IV SOLN: INTRAVENOUS | Status: DC

## 2014-05-04 MED ORDER — INSULIN ASPART PROT & ASPART (70-30 MIX) 100 UNIT/ML ~~LOC~~ SUSP
10.0000 [IU] | Freq: Two times a day (BID) | SUBCUTANEOUS | Status: DC
  Administered 2014-05-04 – 2014-05-05 (×2): 10 [IU] via SUBCUTANEOUS
  Filled 2014-05-04: qty 10

## 2014-05-04 MED ORDER — CHLORHEXIDINE GLUCONATE 0.12 % MT SOLN
15.0000 mL | Freq: Two times a day (BID) | OROMUCOSAL | Status: DC
  Administered 2014-05-04: 15 mL via OROMUCOSAL
  Filled 2014-05-04 (×6): qty 15

## 2014-05-04 MED ORDER — MEPERIDINE HCL 25 MG/ML IJ SOLN: 6.2500 mg | INTRAMUSCULAR | Status: DC | PRN

## 2014-05-04 MED ORDER — BUTAMBEN-TETRACAINE-BENZOCAINE 2-2-14 % EX AERO
INHALATION_SPRAY | CUTANEOUS | Status: DC | PRN
  Administered 2014-05-04 (×2): 1 via TOPICAL

## 2014-05-04 MED ORDER — FENTANYL CITRATE 0.05 MG/ML IJ SOLN: 25.0000 ug | INTRAMUSCULAR | Status: DC | PRN

## 2014-05-04 MED ORDER — SUCRALFATE 1 G PO TABS
1.0000 g | ORAL_TABLET | Freq: Four times a day (QID) | ORAL | Status: DC
  Administered 2014-05-04 – 2014-05-05 (×5): 1 g via ORAL
  Filled 2014-05-04 (×7): qty 1

## 2014-05-04 MED ORDER — CETYLPYRIDINIUM CHLORIDE 0.05 % MT LIQD
7.0000 mL | Freq: Two times a day (BID) | OROMUCOSAL | Status: DC
  Administered 2014-05-04: 7 mL via OROMUCOSAL

## 2014-05-04 MED ORDER — PROMETHAZINE HCL 25 MG/ML IJ SOLN: 6.2500 mg | INTRAMUSCULAR | Status: DC | PRN

## 2014-05-04 MED ORDER — PROPOFOL 10 MG/ML IV BOLUS
INTRAVENOUS | Status: DC | PRN
  Administered 2014-05-04: 100 mg via INTRAVENOUS
  Administered 2014-05-04: 60 mg via INTRAVENOUS

## 2014-05-04 MED ORDER — PROPOFOL INFUSION 10 MG/ML OPTIME
INTRAVENOUS | Status: DC | PRN
  Administered 2014-05-04: 120 ug/kg/min via INTRAVENOUS

## 2014-05-04 NOTE — Anesthesia Preprocedure Evaluation (Addendum)
Anesthesia Evaluation  Patient identified by MRN, date of birth, ID band Patient awake    Reviewed: Allergy & Precautions, H&P , NPO status , Patient's Chart, lab work & pertinent test results, reviewed documented beta blocker date and time   Airway Mallampati: II       Dental  (+) Teeth Intact, Dental Advisory Given   Pulmonary Continuous Positive Airway Pressure Ventilation Sleep apnea: non compliant CPAP. ,  breath sounds clear to auscultation        Cardiovascular hypertension, Pt. on medications +CHF Rhythm:Regular  EKG RBB, reasonably good Exercise tolerance   Neuro/Psych    GI/Hepatic GERD-  Medicated,  Endo/Other  diabetes, Poorly Controlled, Type 2, Insulin Dependent  Renal/GU      Musculoskeletal   Abdominal (+) + obese,   Peds  Hematology  (+) anemia , H/H 8/27   Anesthesia Other Findings   Reproductive/Obstetrics                          Anesthesia Physical Anesthesia Plan  ASA: III  Anesthesia Plan: MAC   Post-op Pain Management:    Induction: Intravenous  Airway Management Planned: Nasal Cannula  Additional Equipment:   Intra-op Plan:   Post-operative Plan:   Informed Consent: I have reviewed the patients History and Physical, chart, labs and discussed the procedure including the risks, benefits and alternatives for the proposed anesthesia with the patient or authorized representative who has indicated his/her understanding and acceptance.     Plan Discussed with:   Anesthesia Plan Comments:         Anesthesia Quick Evaluation

## 2014-05-04 NOTE — Anesthesia Procedure Notes (Signed)
Procedure Name: MAC Date/Time: 05/04/2014 11:50 AM Performed by: Izora Gala Pre-anesthesia Checklist: Suction available, Patient identified, Emergency Drugs available and Patient being monitored Oxygen Delivery Method: Nasal cannula Placement Confirmation: positive ETCO2

## 2014-05-04 NOTE — Consult Note (Signed)
Referring Provider:  Dr. Cruzita Lederer ([facility]) Primary Care Physician:  Philis Fendt, MD Primary Gastroenterologist:  None (unassigned)  Reason for Consultation:  GI bleeding  HPI: Thomas Gardner is a 58 y.o. male with no past history of GI bleeding, admitted in transfer yesterday from [place], where he presented with a several day history of dark and bloody stools.   He feels it began a week ago when he had a hard dark stool, then the next evening, saw blood in the commode, had red emesis (presumably blood, although he wondered if it could have been Gatorade from several hours earlier), and felt lightheaded and weak.   He was seen in the emergency room the next day, where his BUN was elevated at 46, and his hemoglobin was down about 5 g from baseline (9.3 vs. 14.3 in April), but was released. He was able to go to work the next day but continued to have dark red stools and finally, yesterday, decided to get checked out at the [place] emergency room.   At no time has the patient been frankly syncopal.   Risk factors for ulcer disease include daily aspirin and the use of meloxicam on a daily basis for the past several years for joint pains. He is not currently on PPI therapy, although in the past he had reflux symptoms that were treated with some kind of medication which was subsequently stopped, and he has been managing the reflux symptoms by diet alone.   He indicates he had an endoscopy perhaps 20 years ago that showed a "hiatal hernia." He has not had significant prodromal dyspeptic symptomatology. He has not had any involuntary weight loss but has been working on intentional weight loss, by diet. Ordinarily, bowel habits are regular, once a day.   From the colon cancer screening perspective, he indicates she had a colonoscopy many years ago, but has not had one in the past 10 years or so. There is a family history of colon cancer in an aunt but not in the  immediate family.  His admission CT showed hepatic steatosis without other GI tract abnormality; his LFTs are normal.  Since admission to the hospital, the patient has been clinically stable and without any further drop in hemoglobin.  Past Medical History  Diagnosis Date  . CHF (congestive heart failure)   . Diabetes mellitus   . Hypertension   . Arthritis   . Asthma   . GERD (gastroesophageal reflux disease)   . Hyperlipidemia   . Sleep apnea   . Morbid obesity     Past Surgical History  Procedure Laterality Date  . Cardiac catheterization    . Breath tek h pylori  11/11/2011    Procedure: BREATH TEK H PYLORI;  Surgeon: Shann Medal, MD;  Location: [facility];  Service: General;  Laterality: N/A;    Prior to Admission medications   Medication Sig Start Date End Date Taking? Authorizing Provider  albuterol (PROVENTIL,VENTOLIN) 90 MCG/ACT inhaler Inhale 1 puff into the lungs every 6 (six) hours as needed for wheezing or shortness of breath. For chest tightness or wheezing    Yes Historical Provider, MD  aspirin EC 81 MG tablet Take 81 mg by mouth at bedtime.   Yes Historical Provider, MD  carvedilol (COREG) 12.5 MG tablet Take 12.5 mg by mouth 2 (two) times daily with a meal.    Yes Historical Provider, MD  chlorthalidone (HYGROTON) 25 MG tablet Take 25 mg by  mouth every morning.    Yes Historical Provider, MD  furosemide (LASIX) 40 MG tablet Take 1 tablet (40 mg total) by mouth daily. 04/06/12  Yes Hollace Kinnier Sofia, PA-C  glimepiride (AMARYL) 4 MG tablet Take 4 mg by mouth daily.     Yes Historical Provider, MD  HYDROcodone-acetaminophen (NORCO/VICODIN) 5-325 MG per tablet Take 2 tablets by mouth every 4 (four) hours as needed for pain. 04/06/12  Yes Hollace Kinnier Sofia, PA-C  ibuprofen (ADVIL,MOTRIN) 200 MG tablet Take 400 mg by mouth 3 (three) times daily as needed for moderate pain.    Yes Historical Provider, MD  insulin glargine (LANTUS) 100 UNIT/ML injection Inject 60 Units  into the skin at bedtime.    Yes Historical Provider, MD  insulin glulisine (APIDRA SOLOSTAR) 100 UNIT/ML injection Inject 2-6 Units into the skin 3 (three) times daily as needed for high blood sugar. According to sliding scale   Yes Historical Provider, MD  MELOXICAM PO Take 1 tablet by mouth every morning.    Yes Historical Provider, MD  ondansetron (ZOFRAN) 4 MG tablet Take 1 tablet (4 mg total) by mouth every 6 (six) hours as needed for nausea or vomiting. 25/85/27  Yes Delora Fuel, MD  oxyCODONE-acetaminophen (PERCOCET/ROXICET) 5-325 MG per tablet Take 1-2 tablets by mouth every 4 (four) hours as needed for severe pain. 12/24/13  Yes Shari A Upstill, PA-C  potassium chloride (KLOR-CON) 10 MEQ CR tablet Take 10 mEq by mouth daily.     Yes Historical Provider, MD  sitaGLIPtin (JANUVIA) 50 MG tablet Take 50 mg by mouth daily.   Yes Historical Provider, MD  cyclobenzaprine (FLEXERIL) 10 MG tablet Take 1 tablet (10 mg total) by mouth 2 (two) times daily as needed for muscle spasms. Patient not taking: Reported on 05/03/2014 12/24/13   Nehemiah Settle A Upstill, PA-C  omeprazole (PRILOSEC) 20 MG capsule Take 1 capsule (20 mg total) by mouth daily. Patient not taking: Reported on 04/29/2014 08/24/13   Dewaine Oats, PA-C    Current Facility-Administered Medications  Medication Dose Route Frequency Provider Last Rate Last Dose  . 0.9 %  sodium chloride infusion   Intravenous Continuous Rise Patience, MD 50 mL/hr at 05/03/14 2203    . acetaminophen (TYLENOL) tablet 650 mg  650 mg Oral Q6H PRN Rise Patience, MD       Or  . acetaminophen (TYLENOL) suppository 650 mg  650 mg Rectal Q6H PRN Rise Patience, MD      . albuterol (PROVENTIL) (2.5 MG/3ML) 0.083% nebulizer solution 2.5 mg  2.5 mg Nebulization Q6H PRN Rise Patience, MD      . antiseptic oral rinse (CPC / CETYLPYRIDINIUM CHLORIDE 0.05%) solution 7 mL  7 mL Mouth Rinse q12n4p Rise Patience, MD      . chlorhexidine (PERIDEX) 0.12 %  solution 15 mL  15 mL Mouth Rinse BID Rise Patience, MD   15 mL at 05/04/14 0741  . insulin aspart (novoLOG) injection 0-9 Units  0-9 Units Subcutaneous Q4H Rise Patience, MD   0 Units at 05/03/14 2100  . ondansetron (ZOFRAN) tablet 4 mg  4 mg Oral Q6H PRN Rise Patience, MD       Or  . ondansetron [facility]) injection 4 mg  4 mg Intravenous Q6H PRN Rise Patience, MD      . oxyCODONE-acetaminophen (PERCOCET/ROXICET) 5-325 MG per tablet 1-2 tablet  1-2 tablet Oral Q4H PRN Rise Patience, MD      .  pantoprazole (PROTONIX) 80 mg in sodium chloride 0.9 % 250 mL (0.32 mg/mL) infusion  8 mg/hr Intravenous Continuous Rise Patience, MD 25 mL/hr at 05/04/14 0741 8 mg/hr at 05/04/14 0741  . [START ON 05/07/2014] pantoprazole (PROTONIX) injection 40 mg  40 mg Intravenous Q12H Rise Patience, MD        Allergies as of 05/03/2014 - Review Complete 05/03/2014  Allergen Reaction Noted  . Erythromycin Nausea And Vomiting 01/09/2011    Family History  Problem Relation Age of Onset  . Cancer Mother     ovarian  . Diabetes Mother   . Stroke Father   . Cancer Maternal Aunt     breast  . Diabetes Maternal Aunt   . Cancer Maternal Uncle     colon    History   Social History  . Marital Status: Single    Spouse Name: N/A    Number of Children: N/A  . Years of Education: N/A   Occupational History  . Not on file.   Social History Main Topics  . Smoking status: Never Smoker   . Smokeless tobacco: Not on file  . Alcohol Use: No  . Drug Use: No  . Sexual Activity: Not on file   Other Topics Concern  . Not on file   Social History Narrative    Review of Systems:  GI symptoms per history of present illness. No pulmonary symptoms such as chronic cough or dyspnea, no cardiac symptoms such as chest pain. Does have obstructive sleep apnea but finds to see Pap machine to uncomfortable to use. Has had some symptoms of urinary hesitancy, treated medically. He has  some joint pains, particularly in his knees, for which he uses meloxicam. No unusual skin problems.  Physical Exam: Vital signs in last 24 hours: Temp:  [97.7 F (36.5 C)-98.6 F (37 C)] 98.4 F (36.9 C) (12/16 0527) Pulse Rate:  [64-77] 71 (12/16 0527) Resp:  [16-20] 16 (12/16 0527) BP: (114-142)/(52-87) 131/52 mmHg (12/16 0527) SpO2:  [94 %-100 %] 94 % (12/16 0527) Weight:  [116.665 kg (257 lb 3.2 oz)-117.935 kg (260 lb)] 116.665 kg (257 lb 3.2 oz) (12/15 1848) Last BM Date: 05/03/14 General:   Alert,  Well-developed, well-nourished, pleasant and cooperative in NAD Head:  Normocephalic and atraumatic. Eyes:  Sclera clear, no icterus.   Conjunctiva pink. Mouth:   No ulcerations or lesions.  Oropharynx pink & moist. Neck:   No masses or thyromegaly. Lungs:  Clear throughout to auscultation.   No wheezes, crackles, or rhonchi. No evident respiratory distress. Heart:   Regular rate and rhythm; no murmurs, clicks, rubs,  or gallops. Abdomen:  Soft, nontender, nontympanitic, and nondistended. No masses, hepatosplenomegaly or ventral hernias noted. Normal bowel sounds, without bruits, guarding, or rebound. Stool is Hemoccult positive, rectal not performed  Msk:   Symmetrical without gross deformities. Pulses:  Normal radjal pulse noted. Extremities:   Without clubbing, cyanosis, or edema. Neurologic:  Alert and coherent;  grossly normal neurologically. Skin:  Intact without significant lesions or rashes, but with chronic stasis hyperpigmentation of the lower portion of the legs. Cervical Nodes:  No significant cervical adenopathy. Psych:   Alert and cooperative. Normal mood and affect.  Intake/Output from previous day: 12/15 0701 - 12/16 0700 In: 150 [P.O.:150] Out: 700 [Urine:700] Intake/Output this shift: Total I/O In: -  Out: 350 [Urine:350]  Lab Results:  Recent Labs  05/03/14 2206 05/04/14 0028 05/04/14 0430  WBC 11.1* 10.5 9.4  HGB 8.8* 8.7* 8.5*  HCT 27.3* 27.0*  26.8*  PLT 218 219 213   BMET  Recent Labs  05/03/14 1110 05/04/14 0430  NA 139 137  K 3.9 3.4*  CL 97 98  CO2 27 28  GLUCOSE 209* 129*  BUN 21 15  CREATININE 1.00 0.93  CALCIUM 9.2 8.7   LFT  Recent Labs  05/03/14 1110 05/04/14 0430  PROT 8.0 6.6  ALBUMIN 4.0 3.3*  AST 22 11  ALT 12 10  ALKPHOS 70 61  BILITOT 0.4 0.4  BILIDIR <0.2  --   IBILI NOT CALCULATED  --    PT/INR  Recent Labs  05/03/14 1110  LABPROT 13.4  INR 1.02    Studies/Results: Ct Abdomen Pelvis W Contrast  05/03/2014   CLINICAL DATA:  Mid upper abdominal pain, nausea, constipation, no prior abdominal surgeries.  EXAM: CT ABDOMEN AND PELVIS WITH CONTRAST  TECHNIQUE: Multidetector CT imaging of the abdomen and pelvis was performed using the standard protocol following bolus administration of intravenous contrast.  CONTRAST:  89mL OMNIPAQUE IOHEXOL 300 MG/ML SOLN, 166mL OMNIPAQUE IOHEXOL 300 MG/ML SOLN  COMPARISON:  None.  FINDINGS: The lung bases are clear.  Liver is diffusely low in attenuation likely secondary to hepatic steatosis. There is a 9.5 mm hypodensity in the lateral segment of the left hepatic lobe which is too small to characterize. There is no intrahepatic or extrahepatic biliary ductal dilatation. The gallbladder is normal. The spleen demonstrates no focal abnormality. The kidneys, adrenal glands and pancreas are normal. The bladder is unremarkable.  The stomach, duodenum, small intestine, and large intestine demonstrate no contrast extravasation or dilatation. There is a normal caliber appendix in the right lower quadrant without periappendiceal inflammatory changes.There is a small fat containing right periumbilical hernia. There is no pneumoperitoneum, pneumatosis, or portal venous gas. There is no abdominal or pelvic free fluid. There is no lymphadenopathy.  The abdominal aorta is normal in caliber with atherosclerosis.  There are no lytic or sclerotic osseous lesions. There is bilateral  facet arthropathy at L4-5 and L5-S1.  IMPRESSION: 1. No acute abdominal or pelvic pathology 2. Hepatic steatosis.   Electronically Signed   By: Kathreen Devoid   On: 05/03/2014 14:10    Impression: 1. GI bleeding, almost certainly of upper tract origin, presumably the consequence of gastropathy from aspirin and nonsteroidal anti-inflammatory drugs 2. Posthemorrhagic anemia, acute, stable 3. History of GERD, currently diet controlled 4. Hepatic steatosis with normal LFTs 5. Candidate for colon cancer screening  Plan: Upper endoscopy later this morning. Petra Kuba, purpose, and risks reviewed and patient agreeable. Further management to depend on the endoscopic findings.  I anticipate arranging colonoscopy as an outpatient. I have given him my card and encouraged him to call my office to arrange that.   LOS: 1 day   Pavneet Markwood V  05/04/2014, 9:29 AM

## 2014-05-04 NOTE — Progress Notes (Signed)
ZEB RAWL TSV:[id number] DOB: Oct 28, 1955 DOA: 05/03/2014 PCP: Philis Fendt, MD  Brief narrative: 58 y/o ? ty2 DM, Htn,Diastolic CHF, OSA noncompliant cpap, On chronic NSAIDS admitted with UGIB from taking Meloxicam daily  Past medical history-As per Problem list Chart reviewed as below- Reviewed  Consultants:  None  Procedures:  Endoscopy 12/16  Antibiotics:  None   Subjective  Still having some abdominal discomfort however better. Tolerating diet at bedside at present No chest pain no shortness of breath   Objective    Interim History:   Telemetry: Sinus   Objective: Filed Vitals:   05/04/14 0527 05/04/14 1029 05/04/14 1206 05/04/14 1210  BP: 131/52 144/66 127/57 134/75  Pulse: 71 72 68 67  Temp: 98.4 F (36.9 C) 98 F (36.7 C) 98 F (36.7 C)   TempSrc: Oral Oral Oral   Resp: 16 25 18 15   Height:      Weight:      SpO2: 94% 97% 100% 100%    Intake/Output Summary (Last 24 hours) at 05/04/14 1330 Last data filed at 05/04/14 1258  Gross per 24 hour  Intake    150 ml  Output   1350 ml  Net  -1200 ml    Exam:  General: EOMI NCAT. Cardiovascular: S1-S2 no murmur rub or gallop Respiratory: Clinically clear Abdomen: Mild epigastric tenderness but slightly distended Skin no lower extremity edema Neuro range of motion intact  Data Reviewed: Basic Metabolic Panel:  Recent Labs Lab 04/29/14 0200 04/29/14 0226 05/03/14 1110 05/04/14 0430  NA 139 140 139 137  K 3.3* 3.2* 3.9 3.4*  CL 99 100 97 98  CO2 27  --  27 28  GLUCOSE 296* 300* 209* 129*  BUN 46* 43* 21 15  CREATININE 0.97 1.10 1.00 0.93  CALCIUM 9.3  --  9.2 8.7   Liver Function Tests:  Recent Labs Lab 05/03/14 1110 05/04/14 0430  AST 22 11  ALT 12 10  ALKPHOS 70 61  BILITOT 0.4 0.4  PROT 8.0 6.6  ALBUMIN 4.0 3.3*    Recent Labs Lab 05/03/14 1110  LIPASE 21   No results for input(s): AMMONIA in the last 168 hours. CBC:  Recent Labs Lab 05/03/14 1110  05/03/14 2206 05/04/14 0028 05/04/14 0430 05/04/14 0949  WBC 15.3* 11.1* 10.5 9.4 9.4  NEUTROABS 11.0*  --   --   --   --   HGB 9.4* 8.8* 8.7* 8.5* 9.2*  HCT 29.0* 27.3* 27.0* 26.8* 29.5*  MCV 83.1 81.0 83.6 82.0 81.9  PLT 251 218 219 213 245   Cardiac Enzymes:  Recent Labs Lab 05/03/14 2206  TROPONINI <0.30   BNP: Invalid input(s): POCBNP CBG:  Recent Labs Lab 05/03/14 2020 05/04/14 0028 05/04/14 0503 05/04/14 0740 05/04/14 1253  GLUCAP 106* 143* 123* 148* 129*    Recent Results (from the past 240 hour(s))  MRSA PCR Screening     Status: None   Collection Time: 05/03/14  7:53 PM  Result Value Ref Range Status   MRSA by PCR NEGATIVE NEGATIVE Final    Comment:        The GeneXpert MRSA Assay (FDA approved for NASAL specimens only), is one component of a comprehensive MRSA colonization surveillance program. It is not intended to diagnose MRSA infection nor to guide or monitor treatment for MRSA infections.      Studies:              All Imaging reviewed and is as per above notation  Scheduled Meds: . antiseptic oral rinse  7 mL Mouth Rinse q12n4p  . chlorhexidine  15 mL Mouth Rinse BID  . insulin aspart  0-9 Units Subcutaneous Q4H  . pantoprazole  40 mg Oral BID AC  . sucralfate  1 g Oral QID   Continuous Infusions:    Assessment/Plan: 1. Upper GI bleed secondary to ulcers from NSAIDs-patient has been cleared for a regular diet. Risk of rebleed is low. Transition IV to by mouth Protonix 40 mg daily. Continue sucralfate 1 g 4 times a day 2. Diabetes mellitus type 2-nurse manager states patient has difficulty affording Lantus/Januvia-transitioned to by mouth metformin low-dose 500 daily and uptitrated over the next 2 weeks to twice a day. Changed to 7030 insulin twice a day in hospital. We'll keep checking CBGs every 4/4 times a day before meals at bedtime 3. OSA noncompliant on CPAP-outpatient reevaluation 4. History diastolic heart failure, currently  compensated-no records in the chart here at outpatient reevaluation.  Code Status: Full Family Communication:  None present Disposition Plan: Inpatient-possible d/c in am   Verneita Griffes, MD  [facility] Pager [phone] 05/04/2014, 1:30 PM    LOS: 1 day

## 2014-05-04 NOTE — Progress Notes (Signed)
Per Dr. Hal Hope, ok for patient to have ice chips.

## 2014-05-04 NOTE — Progress Notes (Signed)
Utilization review completed.  

## 2014-05-04 NOTE — Transfer of Care (Signed)
Immediate Anesthesia Transfer of Care Note  Patient: Thomas Gardner  Procedure(s) Performed: Procedure(s): ESOPHAGOGASTRODUODENOSCOPY (EGD) WITH PROPOFOL (N/A)  Patient Location: PACU  Anesthesia Type:MAC  Level of Consciousness: awake, alert  and patient cooperative  Airway & Oxygen Therapy: Patient Spontanous Breathing and Patient connected to nasal cannula oxygen  Post-op Assessment: Report given to PACU RN, Post -op Vital signs reviewed and stable and Patient moving all extremities  Post vital signs: Reviewed and stable  Complications: No apparent anesthesia complications

## 2014-05-04 NOTE — Progress Notes (Signed)
EGD shows a deep clean-based ulcer. I think the patient is at low risk of bleeding. I would consider switching him from Protonix infusion to oral Protonix. Please see dictated procedure note for further details and recommendations.  Cleotis Nipper, M.D. [phone]

## 2014-05-04 NOTE — Op Note (Signed)
[facility] [place], [id number]   ENDOSCOPY PROCEDURE REPORT  PATIENT: Thomas Gardner, Thomas Gardner  MR#: [id number] BIRTHDATE: Nov 13, 1955 , 51  yrs. old GENDER: male ENDOSCOPIST:Magan Winnett, MD REFERRED BY: Dr. Nolene Ebbs PROCEDURE DATE:  05/04/2014 PROCEDURE:   Upper endoscopy with biopsies ASA CLASS:    III INDICATIONS: melena and anemia in the setting of long-standing aspirin and meloxicam usage MEDICATION: Mac, per anesthesia TOPICAL ANESTHETIC:   Cetacaine spray  DESCRIPTION OF PROCEDURE:   After the risks and benefits of the procedure were explained, informed consent was obtained.  The Pentax adult       endoscope was introduced through the mouth  and advanced to the second portion of the duodenum .  The instrument was slowly withdrawn as the mucosa was fully examined.    the patient was brought from his hospital room to the [facility] endoscopy unit. Time out was performed and he remained stable with his sedation throughout the procedure.  The Pentax adult video endoscope was passed under direct vision, entering the esophagus without difficulty. The larynx and vocal cords were not well seen during this exam.  The esophagus was normal except for a widely patent Schatzki's ring at the squamocolumnar junction, below which was a 2 cm hiatal hernia.  The stomach was entered. No blood or coffee ground material were present. The patient had 2 ulcers in the antral region, on the greater curve aspect, anteriorly. One of these was quite deep, measuring approximately 2 x 4 cm, with surrounding erythema. Because of its tangential orientation, I was not able to see the entire base of the ulcer, but the part I did see did not have any dirty base, stigmata of hemorrhage, or visible vessel. Nearby was a roughly 1 cm shallow clean-based ulceration. The remainder of the stomach was normal, including a retroflexed view of the cardia.  The  pylorus, duodenal bulb, and second duodenum also looked normal.  Prior to removal of the scope, antral biopsies were obtained to look for evidence of Helicobacter pylori infection.         retroflexion was normal, as noted above.         The scope was then withdrawn from the patient and the procedure completed.  COMPLICATIONS: There were no immediate complications.  ENDOSCOPIC IMPRESSION: 1. No active bleeding or blood in the stomach at the time this procedure 2. Clean-based antral ulcers, consistent with his exposure to aspirin and meloxicam, which would clearly account for his recent bleeding   RECOMMENDATIONS: 1. Intensive antipeptic therapy. I would recommend sucralfate 4 times a day for a week, and pantoprazole 40 mg daily for the next 2 months 2. Await pathology on biopsies and treat Helicobacter pylori infection if present 3. the patient appears to be at low risk for further bleeding, so discharge later today could be considered, if he is able to tolerate food okay 4. The patient should follow-up with his primary physician in approximately 3-7 days, to recheck her hemoglobin level and monitor his Hemoccult status 5. Approximately a month from now, I will plan to see the patient in my office to arrange follow-up endoscopy to confirm ulcer healing, and also to arrange screening colonoscopy, for which he is due 6. The patient needs to avoid nonsteroidal anti-inflammatory drugs. Concerning his aspirin, unless there is felt to be a very strong indication for its use (which I'm not aware of), I would have him hold the aspirin for approximately one month  to allow time for the ulcer to heal   _______________________________ eSigned:  Ronald Lobo, MD 05-21-14 12:08 PM     cc:  CPT CODES: ICD CODES:  The ICD and CPT codes recommended by this software are interpretations from the data that the clinical staff has captured with the software.  The verification of the  translation of this report to the ICD and CPT codes and modifiers is the sole responsibility of the health care institution and practicing physician where this report was generated.  [employer] will not be held responsible for the validity of the ICD and CPT codes included on this report.  AMA assumes no liability for data contained or not contained herein. CPT is a Designer, television/film set of the [employer].  PATIENT NAME:  Thomas Gardner, Thomas Gardner MR#: [id number]

## 2014-05-04 NOTE — Progress Notes (Signed)
Patient without any BMs overnight.

## 2014-05-04 NOTE — Anesthesia Postprocedure Evaluation (Signed)
  Anesthesia Post-op Note  Patient: Thomas Gardner  Procedure(s) Performed: Procedure(s): ESOPHAGOGASTRODUODENOSCOPY (EGD) WITH PROPOFOL (N/A)  Patient Location: PACU  Anesthesia Type:MAC  Level of Consciousness: awake and alert   Airway and Oxygen Therapy: Patient Spontanous Breathing and Patient connected to nasal cannula oxygen  Post-op Pain: none  Post-op Assessment: Post-op Vital signs reviewed, Patient's Cardiovascular Status Stable, Respiratory Function Stable and Patent Airway  Post-op Vital Signs: Reviewed and stable  Last Vitals:  Filed Vitals:   05/04/14 1206  BP: 127/57  Pulse: 68  Temp: 36.7 C  Resp: 18    Complications: No apparent anesthesia complications

## 2014-05-05 ENCOUNTER — Encounter (HOSPITAL_COMMUNITY): Payer: Self-pay | Admitting: Gastroenterology

## 2014-05-05 LAB — CBC WITH DIFFERENTIAL/PLATELET
Basophils Absolute: 0 K/uL (ref 0.0–0.1)
Basophils Relative: 0 % (ref 0–1)
Eosinophils Absolute: 0.5 K/uL (ref 0.0–0.7)
Eosinophils Relative: 5 % (ref 0–5)
HCT: 28.6 % — ABNORMAL LOW (ref 39.0–52.0)
Hemoglobin: 8.9 g/dL — ABNORMAL LOW (ref 13.0–17.0)
Lymphocytes Relative: 26 % (ref 12–46)
Lymphs Abs: 2.3 K/uL (ref 0.7–4.0)
MCH: 25.5 pg — ABNORMAL LOW (ref 26.0–34.0)
MCHC: 31.1 g/dL (ref 30.0–36.0)
MCV: 81.9 fL (ref 78.0–100.0)
Monocytes Absolute: 0.6 K/uL (ref 0.1–1.0)
Monocytes Relative: 6 % (ref 3–12)
Neutro Abs: 5.7 K/uL (ref 1.7–7.7)
Neutrophils Relative %: 63 % (ref 43–77)
Platelets: 249 K/uL (ref 150–400)
RBC: 3.49 MIL/uL — ABNORMAL LOW (ref 4.22–5.81)
RDW: 16 % — ABNORMAL HIGH (ref 11.5–15.5)
WBC: 9 K/uL (ref 4.0–10.5)

## 2014-05-05 LAB — BASIC METABOLIC PANEL WITH GFR
Anion gap: 11 (ref 5–15)
BUN: 15 mg/dL (ref 6–23)
CO2: 28 meq/L (ref 19–32)
Calcium: 8.9 mg/dL (ref 8.4–10.5)
Chloride: 99 meq/L (ref 96–112)
Creatinine, Ser: 1 mg/dL (ref 0.50–1.35)
GFR calc Af Amer: >90 mL/min
GFR calc non Af Amer: 81 mL/min — ABNORMAL LOW
Glucose, Bld: 152 mg/dL — ABNORMAL HIGH (ref 70–99)
Potassium: 3.7 meq/L (ref 3.7–5.3)
Sodium: 138 meq/L (ref 137–147)

## 2014-05-05 LAB — GLUCOSE, CAPILLARY
GLUCOSE-CAPILLARY: 220 mg/dL — AB (ref 70–99)
Glucose-Capillary: 139 mg/dL — ABNORMAL HIGH (ref 70–99)
Glucose-Capillary: 154 mg/dL — ABNORMAL HIGH (ref 70–99)
Glucose-Capillary: 166 mg/dL — ABNORMAL HIGH (ref 70–99)

## 2014-05-05 MED ORDER — INSULIN ASPART PROT & ASPART (70-30 MIX) 100 UNIT/ML ~~LOC~~ SUSP: 10.0000 [IU] | Freq: Two times a day (BID) | SUBCUTANEOUS | Status: DC

## 2014-05-05 MED ORDER — PANTOPRAZOLE SODIUM 40 MG PO TBEC: 40.0000 mg | DELAYED_RELEASE_TABLET | Freq: Two times a day (BID) | ORAL | Status: DC

## 2014-05-05 MED ORDER — SUCRALFATE 1 G PO TABS: 1.0000 g | ORAL_TABLET | Freq: Four times a day (QID) | ORAL | Status: DC

## 2014-05-05 NOTE — Progress Notes (Signed)
Inpatient Diabetes Program Recommendations  AACE/ADA: New Consensus Statement on Inpatient Glycemic Control (2013)  Target Ranges:  Prepandial:   less than 140 mg/dL      Peak postprandial:   less than 180 mg/dL (1-2 hours)      Critically ill patients:  140 - 180 mg/dL   Reason for Visit: review Novolog 70/30 insulin  Diabetes history: Type 2 Going home on Novolog 70/30 I gave the patient a coupon for Novolog 70/30 and reviewed the differences between Lantus/Levemir, Novolog/Apidra/Humalog and Novolog 70/30.  Reviewed the treatment of low blood sugars- recommended f/u with endocrinology.   Gentry Fitz, RN, BA, MHA, CDE Diabetes Coordinator Inpatient Diabetes Program  [phone] (Team Pager) [phone] [facility]) 05/05/2014 1:05 PM

## 2014-05-05 NOTE — Discharge Summary (Addendum)
Physician Discharge Summary  Thomas Gardner LPF:[id number] DOB: [birth year] DOA: 05/03/2014  PCP: Philis Fendt, MD  Admit date: 05/03/2014 Discharge date: 05/05/2014  Time spent: 35 minutes  Recommendations for Outpatient Follow-up:  sucralfate 4 times a day for a week, and pantoprazole 40 mg daily for the next 2 months Await pathology on biopsies and treat Helicobacter pylori infection if present primary physician in approximately 3-7 days, to recheck hemoglobin level and monitor his Hemoccult status GI follow up in 1 month to arrange follow-up endoscopy to confirm ulcer healing, and also to arrange screening colonoscopy, for which he is due avoid nonsteroidal anti-inflammatory drugs. hold the aspirin for approximately one month to allow time for the ulcer to heal -holding BP meds/lasix- resume at next visit if indicated  Discharge Diagnoses:  Active Problems:   GI bleed   Congestive heart failure   Diabetes mellitus type 2, controlled   Essential hypertension   Acute GI bleeding   Bleeding gastrointestinal   Discharge Condition: improved  Diet recommendation: cardiac/diabetic  Filed Weights   05/03/14 1051 05/03/14 1848  Weight: 117.935 kg (260 lb) 116.665 kg (257 lb 3.2 oz)    History of present illness:  Thomas Gardner is a 58 y.o. male with history of diabetes mellitus type 2, arthritis, hypertension, CHF, sleep apnea noncompliant C Pap has been experiencing some black stools over the last 4-5 days. Patient also had some mild constipation. Earlier today patient had frank rectal bleeding and presented to the ER. In the ER patient's Hemoccult was found to be around 9 and has been a drop of almost 5 g from previous 5 months ago. Patient has been having some epigastric discomfort and denies any nausea vomiting. Patient denies having any GI bleed previously. At this time patient's hemodynamics were stable and admitted for GI bleed most likely upper GI given patient takes  meloxicam daily for many years now and also patient has been on aspirin. Patient otherwise denies any chest pain or shortness of breath  Hospital Course:  Upper GI bleed secondary to ulcers from NSAIDs-patient has been cleared for a regular diet. Risk of rebleed is low. Protonix 40 mg daily x 76months. Continue sucralfate 1 g 4 times a day x 1 week  Diabetes mellitus type 2-nurse manager states patient has difficulty affording Lantus/Januvia-did not tolerate metformin Changed to 70/30 insulin as more affordable.  OSA noncompliant on CPAP-outpatient reevaluation  History diastolic heart failure, currently compensated-no records in the chart here at outpatient reevaluation -hold lasix until seen by PCP  Procedures:  Upper endoscopy  Consultations:  GI  Discharge Exam: Filed Vitals:   05/05/14 0538  BP: 124/89  Pulse: 76  Temp: 98.7 F (37.1 C)  Resp: 18    General: A+Ox3, NAD Cardiovascular: rrr Respiratory: clear  Discharge Instructions You were cared for by a hospitalist during your hospital stay. If you have any questions about your discharge medications or the care you received while you were in the hospital after you are discharged, you can call the unit and asked to speak with the hospitalist on call if the hospitalist that took care of you is not available. Once you are discharged, your primary care physician will handle any further medical issues. Please note that NO REFILLS for any discharge medications will be authorized once you are discharged, as it is imperative that you return to your primary care physician (or establish a relationship with a primary care physician if you do not have one) for your aftercare  needs so that they can reassess your need for medications and monitor your lab values.   Current Discharge Medication List    CONTINUE these medications which have NOT CHANGED   Details  albuterol (PROVENTIL,VENTOLIN) 90 MCG/ACT inhaler Inhale 1 puff into the  lungs every 6 (six) hours as needed for wheezing or shortness of breath. For chest tightness or wheezing     aspirin EC 81 MG tablet Take 81 mg by mouth at bedtime.    carvedilol (COREG) 12.5 MG tablet Take 12.5 mg by mouth 2 (two) times daily with a meal.     chlorthalidone (HYGROTON) 25 MG tablet Take 25 mg by mouth every morning.     furosemide (LASIX) 40 MG tablet Take 1 tablet (40 mg total) by mouth daily. Qty: 30 tablet, Refills: 0    glimepiride (AMARYL) 4 MG tablet Take 4 mg by mouth daily.      HYDROcodone-acetaminophen (NORCO/VICODIN) 5-325 MG per tablet Take 2 tablets by mouth every 4 (four) hours as needed for pain. Qty: 10 tablet, Refills: 0    ibuprofen (ADVIL,MOTRIN) 200 MG tablet Take 400 mg by mouth 3 (three) times daily as needed for moderate pain.     insulin glargine (LANTUS) 100 UNIT/ML injection Inject 60 Units into the skin at bedtime.     insulin glulisine (APIDRA SOLOSTAR) 100 UNIT/ML injection Inject 2-6 Units into the skin 3 (three) times daily as needed for high blood sugar. According to sliding scale    MELOXICAM PO Take 1 tablet by mouth every morning.     ondansetron (ZOFRAN) 4 MG tablet Take 1 tablet (4 mg total) by mouth every 6 (six) hours as needed for nausea or vomiting. Qty: 12 tablet, Refills: 0    oxyCODONE-acetaminophen (PERCOCET/ROXICET) 5-325 MG per tablet Take 1-2 tablets by mouth every 4 (four) hours as needed for severe pain. Qty: 20 tablet, Refills: 0    potassium chloride (KLOR-CON) 10 MEQ CR tablet Take 10 mEq by mouth daily.      sitaGLIPtin (JANUVIA) 50 MG tablet Take 50 mg by mouth daily.    cyclobenzaprine (FLEXERIL) 10 MG tablet Take 1 tablet (10 mg total) by mouth 2 (two) times daily as needed for muscle spasms. Qty: 20 tablet, Refills: 0    omeprazole (PRILOSEC) 20 MG capsule Take 1 capsule (20 mg total) by mouth daily. Qty: 20 capsule, Refills: 0       Allergies  Allergen Reactions  . Erythromycin Nausea And Vomiting       The results of significant diagnostics from this hospitalization (including imaging, microbiology, ancillary and laboratory) are listed below for reference.    Significant Diagnostic Studies: Ct Abdomen Pelvis W Contrast  05/03/2014   CLINICAL DATA:  Mid upper abdominal pain, nausea, constipation, no prior abdominal surgeries.  EXAM: CT ABDOMEN AND PELVIS WITH CONTRAST  TECHNIQUE: Multidetector CT imaging of the abdomen and pelvis was performed using the standard protocol following bolus administration of intravenous contrast.  CONTRAST:  31mL OMNIPAQUE IOHEXOL 300 MG/ML SOLN, 159mL OMNIPAQUE IOHEXOL 300 MG/ML SOLN  COMPARISON:  None.  FINDINGS: The lung bases are clear.  Liver is diffusely low in attenuation likely secondary to hepatic steatosis. There is a 9.5 mm hypodensity in the lateral segment of the left hepatic lobe which is too small to characterize. There is no intrahepatic or extrahepatic biliary ductal dilatation. The gallbladder is normal. The spleen demonstrates no focal abnormality. The kidneys, adrenal glands and pancreas are normal. The bladder is unremarkable.  The  stomach, duodenum, small intestine, and large intestine demonstrate no contrast extravasation or dilatation. There is a normal caliber appendix in the right lower quadrant without periappendiceal inflammatory changes.There is a small fat containing right periumbilical hernia. There is no pneumoperitoneum, pneumatosis, or portal venous gas. There is no abdominal or pelvic free fluid. There is no lymphadenopathy.  The abdominal aorta is normal in caliber with atherosclerosis.  There are no lytic or sclerotic osseous lesions. There is bilateral facet arthropathy at L4-5 and L5-S1.  IMPRESSION: 1. No acute abdominal or pelvic pathology 2. Hepatic steatosis.   Electronically Signed   By: Kathreen Devoid   On: 05/03/2014 14:10    Microbiology: Recent Results (from the past 240 hour(s))  MRSA PCR Screening     Status: None    Collection Time: 05/03/14  7:53 PM  Result Value Ref Range Status   MRSA by PCR NEGATIVE NEGATIVE Final    Comment:        The GeneXpert MRSA Assay (FDA approved for NASAL specimens only), is one component of a comprehensive MRSA colonization surveillance program. It is not intended to diagnose MRSA infection nor to guide or monitor treatment for MRSA infections.      Labs: Basic Metabolic Panel:  Recent Labs Lab 04/29/14 0200 04/29/14 0226 05/03/14 1110 05/04/14 0430 05/05/14 0646  NA 139 140 139 137 138  K 3.3* 3.2* 3.9 3.4* 3.7  CL 99 100 97 98 99  CO2 27  --  27 28 28   GLUCOSE 296* 300* 209* 129* 152*  BUN 46* 43* 21 15 15   CREATININE 0.97 1.10 1.00 0.93 1.00  CALCIUM 9.3  --  9.2 8.7 8.9   Liver Function Tests:  Recent Labs Lab 05/03/14 1110 05/04/14 0430  AST 22 11  ALT 12 10  ALKPHOS 70 61  BILITOT 0.4 0.4  PROT 8.0 6.6  ALBUMIN 4.0 3.3*    Recent Labs Lab 05/03/14 1110  LIPASE 21   No results for input(s): AMMONIA in the last 168 hours. CBC:  Recent Labs Lab 05/03/14 1110 05/03/14 2206 05/04/14 0028 05/04/14 0430 05/04/14 0949 05/04/14 1441 05/05/14 0646  WBC 15.3* 11.1* 10.5 9.4 9.4  --  9.0  NEUTROABS 11.0*  --   --   --   --   --  5.7  HGB 9.4* 8.8* 8.7* 8.5* 9.2* 9.3* 8.9*  HCT 29.0* 27.3* 27.0* 26.8* 29.5* 29.1* 28.6*  MCV 83.1 81.0 83.6 82.0 81.9  --  81.9  PLT 251 218 219 213 245  --  249   Cardiac Enzymes:  Recent Labs Lab 05/03/14 2206  TROPONINI <0.30   BNP: BNP (last 3 results) No results for input(s): PROBNP in the last 8760 hours. CBG:  Recent Labs Lab 05/04/14 2047 05/04/14 2247 05/05/14 0019 05/05/14 0417 05/05/14 0800  GLUCAP 186* 148* 139* 154* 166*       Signed:  VANN, JESSICA  [facility] 05/05/2014, 9:15 AM

## 2014-05-05 NOTE — Progress Notes (Signed)
Thomas Gardner to be D/C'd Home per MD order.  Discussed with the patient and all questions fully answered.    Medication List    STOP taking these medications        aspirin EC 81 MG tablet     carvedilol 12.5 MG tablet  Commonly known as:  COREG     chlorthalidone 25 MG tablet  Commonly known as:  HYGROTON     cyclobenzaprine 10 MG tablet  Commonly known as:  FLEXERIL     furosemide 40 MG tablet  Commonly known as:  LASIX     HYDROcodone-acetaminophen 5-325 MG per tablet  Commonly known as:  NORCO/VICODIN     ibuprofen 200 MG tablet  Commonly known as:  ADVIL,MOTRIN     insulin glargine 100 UNIT/ML injection  Commonly known as:  LANTUS     MELOXICAM PO     omeprazole 20 MG capsule  Commonly known as:  PRILOSEC     potassium chloride 10 MEQ CR tablet  Commonly known as:  KLOR-CON      TAKE these medications        albuterol 90 MCG/ACT inhaler  Commonly known as:  PROVENTIL,VENTOLIN  - Inhale 1 puff into the lungs every 6 (six) hours as needed for wheezing or shortness of breath. For chest tightness or wheezing  -      APIDRA SOLOSTAR 100 UNIT/ML injection  Generic drug:  insulin glulisine  Inject 2-6 Units into the skin 3 (three) times daily as needed for high blood sugar. According to sliding scale     glimepiride 4 MG tablet  Commonly known as:  AMARYL  Take 4 mg by mouth daily.     insulin aspart protamine- aspart (70-30) 100 UNIT/ML injection  Commonly known as:  NOVOLOG MIX 70/30  Inject 0.1 mLs (10 Units total) into the skin 2 (two) times daily with a meal.     ondansetron 4 MG tablet  Commonly known as:  ZOFRAN  Take 1 tablet (4 mg total) by mouth every 6 (six) hours as needed for nausea or vomiting.     oxyCODONE-acetaminophen 5-325 MG per tablet  Commonly known as:  PERCOCET/ROXICET  Take 1-2 tablets by mouth every 4 (four) hours as needed for severe pain.     pantoprazole 40 MG tablet  Commonly known as:  PROTONIX  Take 1 tablet (40 mg  total) by mouth 2 (two) times daily before a meal.     sitaGLIPtin 50 MG tablet  Commonly known as:  JANUVIA  Take 50 mg by mouth daily.     sucralfate 1 G tablet  Commonly known as:  CARAFATE  Take 1 tablet (1 g total) by mouth 4 (four) times daily. For 1 week        VVS, Skin clean, dry and intact without evidence of skin break down, no evidence of skin tears noted. IV catheter discontinued intact. Site without signs and symptoms of complications. Dressing and pressure applied.  An After Visit Summary was printed and given to the patient.  D/c education completed with patient/family including follow up instructions, medication list, d/c activities limitations if indicated, with other d/c instructions as indicated by MD - patient able to verbalize understanding, all questions fully answered.   Patient instructed to return to ED, call 911, or call MD for any changes in condition.   Patient escorted via [name], and D/C home via private auto.  [name] F 05/05/2014 2:03 PM

## 2014-05-16 ENCOUNTER — Emergency Department (HOSPITAL_BASED_OUTPATIENT_CLINIC_OR_DEPARTMENT_OTHER): Payer: BC Managed Care – PPO

## 2014-05-16 ENCOUNTER — Encounter (HOSPITAL_BASED_OUTPATIENT_CLINIC_OR_DEPARTMENT_OTHER): Payer: Self-pay

## 2014-05-16 ENCOUNTER — Emergency Department (HOSPITAL_BASED_OUTPATIENT_CLINIC_OR_DEPARTMENT_OTHER)
Admission: EM | Admit: 2014-05-16 | Discharge: 2014-05-16 | Disposition: A | Discharge status: Home / Self Care | Payer: BC Managed Care – PPO | Attending: Emergency Medicine | Admitting: Emergency Medicine

## 2014-05-16 DIAGNOSIS — K219 Gastro-esophageal reflux disease without esophagitis: Secondary | ICD-10-CM | POA: Diagnosis not present

## 2014-05-16 DIAGNOSIS — I509 Heart failure, unspecified: Secondary | ICD-10-CM | POA: Diagnosis not present

## 2014-05-16 DIAGNOSIS — I10 Essential (primary) hypertension: Secondary | ICD-10-CM | POA: Diagnosis not present

## 2014-05-16 DIAGNOSIS — Z79899 Other long term (current) drug therapy: Secondary | ICD-10-CM | POA: Insufficient documentation

## 2014-05-16 DIAGNOSIS — J45909 Unspecified asthma, uncomplicated: Secondary | ICD-10-CM | POA: Insufficient documentation

## 2014-05-16 DIAGNOSIS — Z9889 Other specified postprocedural states: Secondary | ICD-10-CM | POA: Insufficient documentation

## 2014-05-16 DIAGNOSIS — Z794 Long term (current) use of insulin: Secondary | ICD-10-CM | POA: Insufficient documentation

## 2014-05-16 DIAGNOSIS — E119 Type 2 diabetes mellitus without complications: Secondary | ICD-10-CM | POA: Insufficient documentation

## 2014-05-16 DIAGNOSIS — M25572 Pain in left ankle and joints of left foot: Secondary | ICD-10-CM | POA: Diagnosis not present

## 2014-05-16 DIAGNOSIS — M79662 Pain in left lower leg: Secondary | ICD-10-CM | POA: Diagnosis present

## 2014-05-16 DIAGNOSIS — Z8669 Personal history of other diseases of the nervous system and sense organs: Secondary | ICD-10-CM | POA: Diagnosis not present

## 2014-05-16 DIAGNOSIS — M1990 Unspecified osteoarthritis, unspecified site: Secondary | ICD-10-CM | POA: Insufficient documentation

## 2014-05-16 MED ORDER — OXYCODONE-ACETAMINOPHEN 5-325 MG PO TABS: 1.0000 | ORAL_TABLET | ORAL | Status: DC | PRN

## 2014-05-16 NOTE — ED Notes (Signed)
Pt reports woke up on Saturday and had swelling to left ankle and foot and pain with weight bearing.  Pt reports pain has got worse. Denies injury

## 2014-05-16 NOTE — ED Provider Notes (Addendum)
CSN: [id number]     Arrival date & time 05/16/14  1453 History  This chart was scribed for Dot Lanes, MD by Chester Holstein, ED Scribe. This patient was seen in room MH09/MH09 and the patient's care was started at 3:48 PM.    Chief Complaint  Patient presents with  . Leg Pain     Patient is a 58 y.o. male presenting with leg pain. The history is provided by the patient. No language interpreter was used.  Leg Pain   HPI Comments: Thomas Gardner is a 58 y.o. male with PMHx of diabetes who presents to the Emergency Department complaining of ankle pain with onset 2 days ago. Pt notes associated swelling and tenderness. He notes he has some tenderness at baseline but this is worse. Pt notes pain with ambulation. Pt notes he has had similar pain 6 months ago but was resolved in 4 days.  Pt denies any recent injury.  Pt was admitted to hospital on 12/16 and was taken off Meloxicam on 12/15.  He notes he has been taking ASA, but states he has no pain medication to take for his ankle. Pt denies numbness and weakness.   Past Medical History  Diagnosis Date  . CHF (congestive heart failure)   . Diabetes mellitus   . Hypertension   . Arthritis   . Asthma   . GERD (gastroesophageal reflux disease)   . Hyperlipidemia   . Sleep apnea   . Morbid obesity    Past Surgical History  Procedure Laterality Date  . Cardiac catheterization    . Breath tek h pylori  11/11/2011    Procedure: BREATH TEK H PYLORI;  Surgeon: Shann Medal, MD;  Location: [facility];  Service: General;  Laterality: N/A;  . Esophagogastroduodenoscopy (egd) with propofol N/A 05/04/2014    Procedure: ESOPHAGOGASTRODUODENOSCOPY (EGD) WITH PROPOFOL;  Surgeon: Cleotis Nipper, MD;  Location: [facility];  Service: Endoscopy;  Laterality: N/A;   Family History  Problem Relation Age of Onset  . Cancer Mother     ovarian  . Diabetes Mother   . Stroke Father   . Cancer Maternal Aunt     breast  . Diabetes Maternal Aunt    . Cancer Maternal Uncle     colon   History  Substance Use Topics  . Smoking status: Never Smoker   . Smokeless tobacco: Not on file  . Alcohol Use: No    Review of Systems  Musculoskeletal: Positive for myalgias and arthralgias.  Neurological: Negative for weakness and numbness.  All other systems reviewed and are negative.     Allergies  Erythromycin  Home Medications   Prior to Admission medications   Medication Sig Start Date End Date Taking? Authorizing Provider  albuterol (PROVENTIL,VENTOLIN) 90 MCG/ACT inhaler Inhale 1 puff into the lungs every 6 (six) hours as needed for wheezing or shortness of breath. For chest tightness or wheezing     Historical Provider, MD  glimepiride (AMARYL) 4 MG tablet Take 4 mg by mouth daily.      Historical Provider, MD  insulin aspart protamine- aspart (NOVOLOG MIX 70/30) (70-30) 100 UNIT/ML injection Inject 0.1 mLs (10 Units total) into the skin 2 (two) times daily with a meal. 05/05/14   Geradine Girt, DO  insulin glulisine (APIDRA SOLOSTAR) 100 UNIT/ML injection Inject 2-6 Units into the skin 3 (three) times daily as needed for high blood sugar. According to sliding scale    Historical Provider, MD  ondansetron [facility])  4 MG tablet Take 1 tablet (4 mg total) by mouth every 6 (six) hours as needed for nausea or vomiting. 95/09/32   Delora Fuel, MD  oxyCODONE-acetaminophen (PERCOCET/ROXICET) 5-325 MG per tablet Take 1-2 tablets by mouth every 4 (four) hours as needed for severe pain. 05/16/14   Dot Lanes, MD  pantoprazole (PROTONIX) 40 MG tablet Take 1 tablet (40 mg total) by mouth 2 (two) times daily before a meal. 05/05/14   Geradine Girt, DO  sitaGLIPtin (JANUVIA) 50 MG tablet Take 50 mg by mouth daily.    Historical Provider, MD  sucralfate (CARAFATE) 1 G tablet Take 1 tablet (1 g total) by mouth 4 (four) times daily. For 1 week 05/05/14   Tomi Bamberger Vann, DO   BP 123/59 mmHg  Pulse 84  Temp(Src) 98.4 F (36.9 C) (Oral)   Resp 20  Ht 5\' 6"  (1.676 m)  Wt 257 lb (116.574 kg)  BMI 41.50 kg/m2  SpO2 100% Physical Exam Physical Exam  Nursing note and vitals reviewed. Constitutional: He is oriented to person, place, and time. He appears well-developed and well-nourished. No distress.  HENT:  Head: Normocephalic and atraumatic.  Eyes: Pupils are equal, round, and reactive to light.  Neck: Normal range of motion.  Cardiovascular: Normal rate and intact distal pulses.   Pulmonary/Chest: No respiratory distress.  Abdominal: Normal appearance. He exhibits no distension.  Musculoskeletal: Tenderness to palpation on the lateral malleolus.  Some swelling but doesn't appear to be unilateral.  No erythema or signs of cellulitis..  Neurological: He is alert and oriented to person, place, and time. No cranial nerve deficit.  Skin: Skin is warm and dry. No rash noted.  Psychiatric: He has a normal mood and affect. His behavior is normal.   ED Course  Procedures (including critical care time) DIAGNOSTIC STUDIES: Oxygen Saturation is 100% on room air, normal by my interpretation.    COORDINATION OF CARE: 3:52 PM Discussed treatment plan with patient at beside, the patient agrees with the plan and has no further questions at this time.   Labs Review Labs Reviewed - No data to display  Imaging Review Dg Ankle Complete Left  05/16/2014   CLINICAL DATA:  Lateral left ankle pain for 3 days. No known injury.  EXAM: LEFT ANKLE COMPLETE - 3+ VIEW  COMPARISON:  None.  FINDINGS: Suboptimal positioning on the AP radiograph. No acute fracture or dislocation is identified. Small to moderate sized posterior calcaneal enthesophyte is noted. Joint space widths are preserved. No lytic or blastic osseous lesion is seen. No radiopaque foreign body or soft tissue abnormality is identified.  IMPRESSION: Posterior calcaneal spurring, otherwise unremarkable study.   Electronically Signed   By: Logan Bores   On: 05/16/2014 16:14      MDM    Final diagnoses:  Ankle pain, left    I personally performed the services described in this documentation, which was scribed in my presence. The recorded information has been reviewed and considered.      Dot Lanes, MD 05/16/14 [phone]

## 2014-05-16 NOTE — Discharge Instructions (Signed)

## 2014-06-13 ENCOUNTER — Encounter: Payer: Self-pay | Admitting: Internal Medicine

## 2014-06-13 ENCOUNTER — Ambulatory Visit (INDEPENDENT_AMBULATORY_CARE_PROVIDER_SITE_OTHER): Payer: BLUE CROSS/BLUE SHIELD | Admitting: Internal Medicine

## 2014-06-13 VITALS — BP 114/64 | HR 83 | Temp 98.3°F | Resp 12 | Ht 66.0 in | Wt 250.8 lb

## 2014-06-13 DIAGNOSIS — I10 Essential (primary) hypertension: Secondary | ICD-10-CM

## 2014-06-13 DIAGNOSIS — E1165 Type 2 diabetes mellitus with hyperglycemia: Secondary | ICD-10-CM

## 2014-06-13 DIAGNOSIS — E785 Hyperlipidemia, unspecified: Secondary | ICD-10-CM

## 2014-06-13 DIAGNOSIS — IMO0001 Reserved for inherently not codable concepts without codable children: Secondary | ICD-10-CM

## 2014-06-13 LAB — MICROALBUMIN / CREATININE URINE RATIO
CREATININE, U: 36.8 mg/dL
MICROALB UR: 1.9 mg/dL (ref 0.0–1.9)
Microalb Creat Ratio: 5.2 mg/g (ref 0.0–30.0)

## 2014-06-13 LAB — HEMOGLOBIN A1C: Hgb A1c MFr Bld: 7.6 % — ABNORMAL HIGH (ref 4.6–6.5)

## 2014-06-13 MED ORDER — GLIMEPIRIDE 4 MG PO TABS: 4.0000 mg | ORAL_TABLET | Freq: Two times a day (BID) | ORAL | Status: DC

## 2014-06-13 MED ORDER — ROSUVASTATIN CALCIUM 5 MG PO TABS: 5.0000 mg | ORAL_TABLET | Freq: Every day | ORAL | Status: DC

## 2014-06-13 MED ORDER — CANAGLIFLOZIN 100 MG PO TABS: 100.0000 mg | ORAL_TABLET | Freq: Every day | ORAL | Status: DC

## 2014-06-13 NOTE — Patient Instructions (Signed)
Please decrease Lantus to 20 units at bedtime. Please increase Amaryl to 4 mg 2x a day, before breakfast and before dinner. Please continue Januvia 100 mg before breakfast. Please add Invokana 100 mg before breakfast.  Please start Crestor 5 mg weekly.  Please stop Lasix but check Blood Pressure at home and restart 1/2 tablet of Lasix if BP consistently >140/80.  Please stop at the lab.  PATIENT INSTRUCTIONS FOR TYPE 2 DIABETES:  **Please join MyChart!** - see attached instructions about how to join if you have not done so already.  DIET AND EXERCISE Diet and exercise is an important part of diabetic treatment.  We recommended aerobic exercise in the form of brisk walking (working between 40-60% of maximal aerobic capacity, similar to brisk walking) for 150 minutes per week (such as 30 minutes five days per week) along with 3 times per week performing 'resistance' training (using various gauge rubber tubes with handles) 5-10 exercises involving the major muscle groups (upper body, lower body and core) performing 10-15 repetitions (or near fatigue) each exercise. Start at half the above goal but build slowly to reach the above goals. If limited by weight, joint pain, or disability, we recommend daily walking in a swimming pool with water up to waist to reduce pressure from joints while allow for adequate exercise.    BLOOD GLUCOSES Monitoring your blood glucoses is important for continued management of your diabetes. Please check your blood glucoses 2-4 times a day: fasting, before meals and at bedtime (you can rotate these measurements - e.g. one day check before the 3 meals, the next day check before 2 of the meals and before bedtime, etc.).   HYPOGLYCEMIA (low blood sugar) Hypoglycemia is usually a reaction to not eating, exercising, or taking too much insulin/ other diabetes drugs.  Symptoms include tremors, sweating, hunger, confusion, headache, etc. Treat IMMEDIATELY with 15 grams of  Carbs: . 4 glucose tablets .  cup regular juice/soda . 2 tablespoons raisins . 4 teaspoons sugar . 1 tablespoon honey Recheck blood glucose in 15 mins and repeat above if still symptomatic/blood glucose <100.  RECOMMENDATIONS TO REDUCE YOUR RISK OF DIABETIC COMPLICATIONS: * Take your prescribed MEDICATION(S) * Follow a DIABETIC diet: Complex carbs, fiber rich foods, (monounsaturated and polyunsaturated) fats * AVOID saturated/trans fats, high fat foods, >2,300 mg salt per day. * EXERCISE at least 5 times a week for 30 minutes or preferably daily.  * DO NOT SMOKE OR DRINK more than 1 drink a day. * Check your FEET every day. Do not wear tightfitting shoes. Contact us if you develop an ulcer * See your EYE doctor once a year or more if needed * Get a FLU shot once a year * Get a PNEUMONIA vaccine once before and once after age 80 years  GOALS:  * Your Hemoglobin A1c of <7%  * fasting sugars need to be <130 * after meals sugars need to be <180 (2h after you start eating) * Your Systolic BP should be 784 or lower  * Your Diastolic BP should be 80 or lower  * Your HDL (Good Cholesterol) should be 40 or higher  * Your LDL (Bad Cholesterol) should be 100 or lower. * Your Triglycerides should be 150 or lower  * Your Urine microalbumin (kidney function) should be <30 * Your Body Mass Index should be 25 or lower    Please consider the following ways to cut down carbs and fat and increase fiber and micronutrients in your diet: -  substitute whole grain for white bread or pasta - substitute brown rice for white rice - substitute 90-calorie flat bread pieces for slices of bread when possible - substitute sweet potatoes or yams for white potatoes - substitute humus for margarine - substitute tofu for cheese when possible - substitute almond or rice milk for regular milk (would not drink soy milk daily due to concern for soy estrogen influence on breast cancer risk) - substitute dark  chocolate for other sweets when possible - substitute water - can add lemon or orange slices for taste - for diet sodas (artificial sweeteners will trick your body that you can eat sweets without getting calories and will lead you to overeating and weight gain in the long run) - do not skip breakfast or other meals (this will slow down the metabolism and will result in more weight gain over time)  - can try smoothies made from fruit and almond/rice milk in am instead of regular breakfast - can also try old-fashioned (not instant) oatmeal made with almond/rice milk in am - order the dressing on the side when eating salad at a restaurant (pour less than half of the dressing on the salad) - eat as little meat as possible - can try juicing, but should not forget that juicing will get rid of the fiber, so would alternate with eating raw veg./fruits or drinking smoothies - use as little oil as possible, even when using olive oil - can dress a salad with a mix of balsamic vinegar and lemon juice, for e.g. - use agave nectar, stevia sugar, or regular sugar rather than artificial sweateners - steam or broil/roast veggies  - snack on veggies/fruit/nuts (unsalted, preferably) when possible, rather than processed foods - reduce or eliminate aspartame in diet (it is in diet sodas, chewing gum, etc) Read the labels!  Try to read Dr. Janene Harvey book: "Program for Reversing Diabetes" for other ideas for healthy eating.

## 2014-06-13 NOTE — Progress Notes (Signed)
Patient ID: Thomas Gardner, male   DOB: 02/06/1956, 59 y.o.   MRN: [id number]  HPI: Thomas Gardner is a 59 y.o.-year-old male, referred by the [facility] team, for management of DM2, dx in ~2003, insulin-dependent, uncontrolled, without complications.  Last hemoglobin A1c was: Lab Results  Component Value Date   HGBA1C 10.3* 10/28/2011  He had steroid inj in knees 2 weeks ago >> sugar 564.  Pt is on a regimen of: - Januvia 100 mg in am - Amaryl 4 mg in am - Lantus 30 units at bedtime (sometimes, decreases the dose to 10 units - once a week) - lower sugars after he started a weight loss pgm (lost 40 lbs in 4 month). He exercises and stopped bread, sodas and sweet tea. - Apidra SSI: target 150, ISF 25 - 4x a day! - total 10-18 units He could not tolerate Metformin >> mood swings, confusion. He tried Janumet >> sugar fluctuations >> stopped Novolog 70/30 >> started in hospital 04/2014, but now can afford Lantus  Pt checks his sugars 3-4x a day and they are: - am: 115-160 - 2h after b'fast: 160-185 - before lunch: 160-185 - 2h after lunch: n/c - before dinner: 185-200 - 2h after dinner: n/c - bedtime: 200-250  - nighttime: n/c Has lows every other day. Lowest sugar was 70; he has hypoglycemia awareness at 80.  Highest sugar was 565 with steroid inj; OTW 350.  Glucometer: One Touch Prime  Pt's meals are: - Breakfast: egg + sausage/bacon + cheese; Protein shake - Lunch: salad from [employer]; pasta; sometimes Whopper; fish fillet; japanese stir fry - Dinner: cereal (cheerios) + lact-aid milk; chicken, pasta or salad - Snacks: graham crackers; popcorn  - no CKD, last BUN/creatinine:  Lab Results  Component Value Date   BUN 15 05/05/2014   CREATININE 1.00 05/05/2014  Not on ACEI. Was on it in the past. - last set of lipids: Lab Results  Component Value Date   HDL 34* 10/28/2011   LDLCALC 216* 10/28/2011   TRIG 163* 10/28/2011   CHOLHDL 8.3* 10/28/2011  Was on Lipitor  >> Nausea >> stopped few years ago. Last LFTs: Lab Results  Component Value Date   ALT 10 05/04/2014   AST 11 05/04/2014   ALKPHOS 61 05/04/2014   BILITOT 0.4 05/04/2014   - last eye exam was in 2014. No DR.  - no numbness and tingling in his feet.  Pt has FH of DM in mother.  He also has h/o diastolic heart failure, HTN (On Chlorthalidone and Carvedilol), OSA, noncompliant with CPAP, recent upper GI bleed 2/2 PUD 2/2 NSAIDs.  ROS: Constitutional: + weight loss, no fatigue, + hot flushes, + poor sleep Eyes: + blurry vision, no xerophthalmia ENT: no sore throat, no nodules palpated in throat, no dysphagia/odynophagia, no hoarseness, + tinnitus Cardiovascular: no CP/SOB/palpitations/+ leg swelling Respiratory: + cough/no SOB Gastrointestinal: + all: N/V/D/C, no acid reflux Musculoskeletal: + both: muscle/joint aches Skin: no rashes Neurological: no tremors/numbness/tingling/dizziness, + HA Psychiatric: + both: depression/anxiety  Past Medical History  Diagnosis Date  . CHF (congestive heart failure)   . Diabetes mellitus   . Hypertension   . Arthritis   . Asthma   . GERD (gastroesophageal reflux disease)   . Hyperlipidemia   . Sleep apnea   . Morbid obesity    Past Surgical History  Procedure Laterality Date  . Cardiac catheterization    . Breath tek h pylori  11/11/2011    Procedure: BREATH TEK H  PYLORI;  Surgeon: Shann Medal, MD;  Location: [facility];  Service: General;  Laterality: N/A;  . Esophagogastroduodenoscopy (egd) with propofol N/A 05/04/2014    Procedure: ESOPHAGOGASTRODUODENOSCOPY (EGD) WITH PROPOFOL;  Surgeon: Cleotis Nipper, MD;  Location: [facility];  Service: Endoscopy;  Laterality: N/A;   History   Social History  . Marital Status: Single    Spouse Name: N/A    Number of Children: 0   Occupational History  . Cosmetology educator/manager   Social History Main Topics  . Smoking status: Never Smoker   . Smokeless tobacco: Not on file   . Alcohol Use: No  . Drug Use: No   Current Outpatient Prescriptions on File Prior to Visit  Medication Sig Dispense Refill  . albuterol (PROVENTIL,VENTOLIN) 90 MCG/ACT inhaler Inhale 1 puff into the lungs every 6 (six) hours as needed for wheezing or shortness of breath. For chest tightness or wheezing     . glimepiride (AMARYL) 4 MG tablet Take 4 mg by mouth daily.      . insulin glulisine (APIDRA SOLOSTAR) 100 UNIT/ML injection Inject 2-6 Units into the skin 3 (three) times daily as needed for high blood sugar. According to sliding scale    . oxyCODONE-acetaminophen (PERCOCET/ROXICET) 5-325 MG per tablet Take 1-2 tablets by mouth every 4 (four) hours as needed for severe pain. 20 tablet 0  . pantoprazole (PROTONIX) 40 MG tablet Take 1 tablet (40 mg total) by mouth 2 (two) times daily before a meal. 60 tablet 1  . sitaGLIPtin (JANUVIA) 50 MG tablet Take 50 mg by mouth daily.    . insulin aspart protamine- aspart (NOVOLOG MIX 70/30) (70-30) 100 UNIT/ML injection Inject 0.1 mLs (10 Units total) into the skin 2 (two) times daily with a meal. (Patient not taking: Reported on 06/13/2014) 10 mL 11  . ondansetron (ZOFRAN) 4 MG tablet Take 1 tablet (4 mg total) by mouth every 6 (six) hours as needed for nausea or vomiting. (Patient not taking: Reported on 06/13/2014) 12 tablet 0  . sucralfate (CARAFATE) 1 G tablet Take 1 tablet (1 g total) by mouth 4 (four) times daily. For 1 week (Patient not taking: Reported on 06/13/2014) 28 tablet 0   No current facility-administered medications on file prior to visit.   Allergies  Allergen Reactions  . Erythromycin Nausea And Vomiting   Family History  Problem Relation Age of Onset  . Cancer Mother     ovarian  . Diabetes Mother   . Stroke Father   . Cancer Maternal Aunt     breast  . Diabetes Maternal Aunt   . Cancer Maternal Uncle     colon   PE: BP 114/64 mmHg  Pulse 83  Temp(Src) 98.3 F (36.8 C) (Oral)  Resp 12  Ht 5\' 6"  (1.676 m)  Wt 250  lb 12.8 oz (113.762 kg)  BMI 40.50 kg/m2  SpO2 96% Wt Readings from Last 3 Encounters:  06/13/14 250 lb 12.8 oz (113.762 kg)  05/16/14 257 lb (116.574 kg)  05/03/14 257 lb 3.2 oz (116.665 kg)   Constitutional: obese, in NAD Eyes: PERRLA, EOMI, no exophthalmos ENT: moist mucous membranes, no thyromegaly, no cervical lymphadenopathy Cardiovascular: RRR, No MRG Respiratory: CTA B Gastrointestinal: abdomen soft, NT, ND, BS+ Musculoskeletal: no deformities, strength intact in all 4 Skin: moist, warm, no rashes Neurological: no tremor with outstretched hands, DTR normal in all 4  ASSESSMENT: 1. DM2, non-insulin-dependent, uncontrolled, without complications  2. HTN  3. HL  PLAN:  1. Patient  with long-standing, uncontrolled diabetes, on oral + premixed insulin antidiabetic regimen, which became insufficient. His sugars have a staircase effect >> getting higher throughout the day >> a sign of insufficient mealtime coverage. He is using Januvia and am Amaryl for meal coverage and has SSI. I will double the Amaryl dose and add Invokana, while stopping the Apidra.   - I suggested to:  Patient Instructions  Please decrease Lantus to 20 units at bedtime. Please increase Amaryl to 4 mg 2x a day, before breakfast and before dinner. Please continue Januvia 100 mg before breakfast. Please add Invokana 100 mg before breakfast.  Please start Crestor 5 mg weekly.  Please stop Lasix but check Blood Pressure at home and restart 1/2 tablet of Lasix if BP consistently >140/80.  Please stop at the lab. - we discussed about SEs of Invokana, which are: dizziness (advised to be careful when stands from sitting position), decreased BP - usually not < normal (BP today is not low), and fungal UTIs (advised to let me know if develops one).  - given discount card for Invokana - will stop Lasix since he is also on Chlorthalidone and we are starting Invokana, but he has a BP cuff at home and I advised him to  restart at half-dose if BP increases. - continue checking sugars at different times of the day - check 3 times a day, rotating checks - given sugar log and advised how to fill it and to bring it at next appt  - given foot care handout and explained the principles  - given instructions for hypoglycemia management "15-15 rule"  - advised for yearly eye exams >> he needs one - check HbA1c and ACR at this visit - discussed diet >> made specific suggestions >> e.g to eliminate the protein shake in am, etc. - Return to clinic in 1 mo with sugar log  2. HTN - BP today is great - we stopped Lasix as we started Invokana but he continues the Chlorthalidone (he believes he is on 25 mg daily - will check at home)  3. HL - Pt with long h/o HL, last lipid panel is from 2013 >> LDL in the 200s. He tells me his LDL was always high, even when very young. He is not on a statin as he had nausea with Lipitor.  - I suggested Crestor 5 weekly, then increase as tolerated.  - given discount card for Crestor - Will check Lipids and LFTs at next visit - his diet and weight loss will also help   - time spent with the patient: 1 hour, of which >50% was spent in obtaining information about his DM and HTN, HL, reviewing previous labs, evaluations, and treatments, counseling him about her conditions (please see the discussed topics above), and developing a plan to further investigate and treat them. He had a number of questions which I addressed.  Office Visit on 06/13/2014  Component Date Value Ref Range Status  . Hgb A1c MFr Bld 06/13/2014 7.6* 4.6 - 6.5 % Final   Glycemic Control Guidelines for People with Diabetes:Non Diabetic:  <6%Goal of Therapy: <7%Additional Action Suggested:  >8%   . Microalb, Ur 06/13/2014 1.9  0.0 - 1.9 mg/dL Final  . Creatinine,U 06/13/2014 36.8   Final  . Microalb Creat Ratio 06/13/2014 5.2  0.0 - 30.0 mg/g Final   No increased urinary proteins. HbA1c lower than I expected. Continue  with the plan above have a low threshold to reduce aMARYL.

## 2014-06-15 DIAGNOSIS — E785 Hyperlipidemia, unspecified: Secondary | ICD-10-CM | POA: Insufficient documentation

## 2014-07-18 ENCOUNTER — Ambulatory Visit: Payer: Self-pay | Admitting: Internal Medicine

## 2014-08-02 ENCOUNTER — Encounter: Payer: Self-pay | Admitting: Internal Medicine

## 2014-08-02 ENCOUNTER — Ambulatory Visit (INDEPENDENT_AMBULATORY_CARE_PROVIDER_SITE_OTHER): Payer: BLUE CROSS/BLUE SHIELD | Admitting: Internal Medicine

## 2014-08-02 VITALS — BP 130/60 | HR 90 | Temp 98.3°F | Resp 12 | Wt 249.0 lb

## 2014-08-02 DIAGNOSIS — E785 Hyperlipidemia, unspecified: Secondary | ICD-10-CM

## 2014-08-02 DIAGNOSIS — IMO0001 Reserved for inherently not codable concepts without codable children: Secondary | ICD-10-CM

## 2014-08-02 DIAGNOSIS — E1165 Type 2 diabetes mellitus with hyperglycemia: Secondary | ICD-10-CM

## 2014-08-02 DIAGNOSIS — I10 Essential (primary) hypertension: Secondary | ICD-10-CM

## 2014-08-02 NOTE — Progress Notes (Addendum)
Patient ID: Thomas Gardner, male   DOB: 1955-12-06, 59 y.o.   MRN: [id number]  HPI: Thomas Gardner is a 59 y.o.-year-old male,returning for f/u for DM2, dx in ~2003, insulin-dependent, uncontrolled, without complications. Last visit 1 mo ago.  Last hemoglobin A1c was: Lab Results  Component Value Date   HGBA1C 7.6* 06/13/2014   HGBA1C 10.3* 10/28/2011  He had steroid inj in knees >> sugar 564, not since last visit.  Pt is on a regimen of: - Januvia 100 mg in am - Amaryl 4 mg 2x a day - Lantus 30 units >> 20 >> 10 at bedtime - ran out 3 days ago >> sugars the same.  - Invokana 100 mg daily - started 06/2014 We stopped Apidra SSI: target 150, ISF 25 - 4x a day! - total 10-18 units He could not tolerate Metformin >> mood swings, confusion. He tried Janumet >> sugar fluctuations >> stopped Novolog 70/30 >> started in hospital 04/2014, but now can afford Lantus.  Pt checks his sugars 3x a day and they are MUCH improved: - am: 115-160 >> 98-115 - 2h after b'fast: 160-185 >> 115 - before lunch: 160-185 >> 115-120 - 2h after lunch: n/c >> 120-150 - before dinner: 185-200 >> 66-85 (takes Amaryl 2h before dinner!!!) - 2h after dinner: n/c - bedtime: 200-250 >> 118-120 - nighttime: n/c Has lows every other day. Lowest sugar was 70; he has hypoglycemia awareness at 80.  Highest sugar was 565 with steroid inj; OTW 350 >> 214x 1  Glucometer: One Touch Prime  Pt's meals are: - Breakfast: egg + sausage/bacon + cheese; Protein shake - Lunch: salad from [employer]; pasta; sometimes Whopper; fish fillet; japanese stir fry - Dinner: cereal (cheerios) + lact-aid milk; chicken, pasta or salad - Snacks: graham crackers; popcorn He exercises and stopped bread, sodas and sweet tea.  - no CKD, last BUN/creatinine:  Lab Results  Component Value Date   BUN 15 05/05/2014   CREATININE 1.00 05/05/2014  Not on ACEI. Was on it in the past. - last set of lipids: Lab Results  Component Value Date   CHOL  283* 10/28/2011   HDL 34* 10/28/2011   LDLCALC 216* 10/28/2011   TRIG 163* 10/28/2011   CHOLHDL 8.3* 10/28/2011  Was on Lipitor >> Nausea >> stopped few years ago. At last visit >> we started Crestor 5 mg daily. He tolerates this well Last LFTs: Lab Results  Component Value Date   ALT 10 05/04/2014   AST 11 05/04/2014   ALKPHOS 61 05/04/2014   BILITOT 0.4 05/04/2014   - last eye exam was in 2014. No DR.  - no numbness and tingling in his feet.  He also has h/o diastolic heart failure, HTN (On Chlorthalidone and Carvedilol), OSA, noncompliant with CPAP, recent upper GI bleed 2/2 PUD 2/2 NSAIDs.   He was recently dx'ed with gout >> started Colcrys.   ROS: Constitutional: no weight loss/gain, no fatigue, + hot flushes Eyes: + blurry vision, no xerophthalmia ENT: no sore throat, no nodules palpated in throat, no dysphagia/odynophagia, no hoarseness, + tinnitus Cardiovascular: no CP/SOB/palpitations/+ leg swelling Respiratory: no cough/no SOB Gastrointestinal: no N/no V/+ D/noC, no acid reflux Musculoskeletal: no muscle/+ joint aches Skin: no rashes Neurological: no tremors/numbness/tingling/dizziness, + HA + diff with erections  I reviewed pt's medications, allergies, PMH, social hx, family hx, and changes were documented in the history of present illness. Otherwise, unchanged from my initial visit note.  Past Medical History  Diagnosis Date  .  CHF (congestive heart failure)   . Diabetes mellitus   . Hypertension   . Arthritis   . Asthma   . GERD (gastroesophageal reflux disease)   . Hyperlipidemia   . Sleep apnea   . Morbid obesity    Past Surgical History  Procedure Laterality Date  . Cardiac catheterization    . Breath tek h pylori  11/11/2011    Procedure: BREATH TEK H PYLORI;  Surgeon: Shann Medal, MD;  Location: [facility];  Service: General;  Laterality: N/A;  . Esophagogastroduodenoscopy (egd) with propofol N/A 05/04/2014    Procedure:  ESOPHAGOGASTRODUODENOSCOPY (EGD) WITH PROPOFOL;  Surgeon: Cleotis Nipper, MD;  Location: [facility];  Service: Endoscopy;  Laterality: N/A;   History   Social History  . Marital Status: Single    Spouse Name: N/A    Number of Children: 0   Occupational History  . Cosmetology educator/manager   Social History Main Topics  . Smoking status: Never Smoker   . Smokeless tobacco: Not on file  . Alcohol Use: No  . Drug Use: No   Current Outpatient Prescriptions on File Prior to Visit  Medication Sig Dispense Refill  . albuterol (PROVENTIL,VENTOLIN) 90 MCG/ACT inhaler Inhale 1 puff into the lungs every 6 (six) hours as needed for wheezing or shortness of breath. For chest tightness or wheezing     . canagliflozin (INVOKANA) 100 MG TABS tablet Take 1 tablet (100 mg total) by mouth daily. 30 tablet 2  . furosemide (LASIX) 40 MG tablet Take 40 mg by mouth daily.    Marland Kitchen glimepiride (AMARYL) 4 MG tablet Take 1 tablet (4 mg total) by mouth 2 (two) times daily before a meal. 60 tablet 2  . Insulin Glargine (LANTUS) 100 UNIT/ML Solostar Pen Inject 20 Units into the skin daily at 10 pm.    . oxyCODONE-acetaminophen (PERCOCET/ROXICET) 5-325 MG per tablet Take 1-2 tablets by mouth every 4 (four) hours as needed for severe pain. 20 tablet 0  . pantoprazole (PROTONIX) 40 MG tablet Take 1 tablet (40 mg total) by mouth 2 (two) times daily before a meal. 60 tablet 1  . rosuvastatin (CRESTOR) 5 MG tablet Take 1 tablet (5 mg total) by mouth daily. 30 tablet 1  . sitaGLIPtin (JANUVIA) 50 MG tablet Take 50 mg by mouth daily.    . sucralfate (CARAFATE) 1 G tablet Take 1 tablet (1 g total) by mouth 4 (four) times daily. For 1 week 28 tablet 0  . ondansetron (ZOFRAN) 4 MG tablet Take 1 tablet (4 mg total) by mouth every 6 (six) hours as needed for nausea or vomiting. (Patient not taking: Reported on 08/02/2014) 12 tablet 0   No current facility-administered medications on file prior to visit.   Allergies   Allergen Reactions  . Erythromycin Nausea And Vomiting   Family History  Problem Relation Age of Onset  . Cancer Mother     ovarian  . Diabetes Mother   . Stroke Father   . Cancer Maternal Aunt     breast  . Diabetes Maternal Aunt   . Cancer Maternal Uncle     colon   PE: BP 130/60 mmHg  Pulse 90  Temp(Src) 98.3 F (36.8 C) (Oral)  Resp 12  Wt 249 lb (112.946 kg)  SpO2 97% Wt Readings from Last 3 Encounters:  08/02/14 249 lb (112.946 kg)  06/13/14 250 lb 12.8 oz (113.762 kg)  05/16/14 257 lb (116.574 kg)   Constitutional: obese, in NAD Eyes: PERRLA, EOMI,  no exophthalmos ENT: moist mucous membranes, no thyromegaly, no cervical lymphadenopathy Cardiovascular: RRR, No MRG Respiratory: CTA B Gastrointestinal: abdomen soft, NT, ND, BS+ Musculoskeletal: no deformities, strength intact in all 4 Skin: moist, warm, no rashes Neurological: no tremor with outstretched hands, DTR normal in all 4  ASSESSMENT: 1. DM2, non-insulin-dependent, uncontrolled, without complications  2. HTN  3. HL  PLAN:  1. Patient with long-standing, uncontrolled diabetes, on oral + basal insulin antidiabetic regimen, now with dramatic improvement in sugars after adding Invokana and increasing Amaryl, despite decreasing Lantus and stopping the Apidra. - I suggested to also stop Lantus for now, and then decrease Amaryl if sugars are controlled. He has lows before dinner as he takes the second dose of Amaryl 2 hours before dinner >> will move this right before dinner:  Patient Instructions  Please contnue: - Januvia 100 mg in am - Amaryl 4 mg 2x a day - move the second dose before dinner - Invokana 100 mg daily in am  Stop Lantus.  If sugars are controlled in the next week (off Lantus), please decrease the Amaryl to 2 mg 2x a day, before meals.  Please come on Monday, fasting, for labs.  - continue checking sugars at different times of the day - check 3 times a day, rotating checks - given  more sugar logs  - advised for yearly eye exams >> he needs one - check HbA1c and ACR at this visit - Return to clinic in 2 mo with sugar log  2. HTN - we stopped Lasix since he was also on Chlorthalidone and we starrted Invokana - BP today is great  3. HL - Pt with long h/o HL, last lipid panel is from 2013 >> LDL in the 200s. He tells me his LDL was always high, even when very young. He had nausea with Lipitor >> I suggested Crestor 5 mg >> he now takes it every am >> continuethis - Will check Lipids and LFTs fasting, in 6 days  Component     Latest Ref Rng 08/08/2014  Sodium     135 - 145 mEq/L 138  Potassium     3.5 - 5.1 mEq/L 3.8  Chloride     96 - 112 mEq/L 103  CO2     19 - 32 mEq/L 30  Glucose     70 - 99 mg/dL 120 (H)  BUN     6 - 23 mg/dL 22  Creatinine     0.40 - 1.50 mg/dL 1.16  Total Bilirubin     0.2 - 1.2 mg/dL 0.4  Alkaline Phosphatase     39 - 117 U/L 52  AST     0 - 37 U/L 16  ALT     0 - 53 U/L 15  Total Protein     6.0 - 8.3 g/dL 7.2  Albumin     3.5 - 5.2 g/dL 4.0  Calcium     8.4 - 10.5 mg/dL 9.4  GFR     >60.00 mL/min 82.83   Component     Latest Ref Rng 08/08/2014  Cholesterol     0 - 200 mg/dL 155  Triglycerides     0.0 - 149.0 mg/dL 88.0  HDL     >39.00 mg/dL 30.60 (L)  VLDL     0.0 - 40.0 mg/dL 17.6  LDL (calc)     0 - 99 mg/dL 107 (H)  Total CHOL/HDL Ratio      5  NonHDL  124.40   Component     Latest Ref Rng 08/08/2014  Uric Acid, Serum     4.0 - 7.8 mg/dL 6.1   We called pt: Uric acid not high, Glucose 120, GFR not decreased. Lipids much better except HDL, which is a little lower.

## 2014-08-02 NOTE — Patient Instructions (Signed)
Please contnue: - Januvia 100 mg in am - Amaryl 4 mg 2x a day - move the second dose before dinner - Invokana 100 mg daily in am  Stop Lantus.  If sugars are controlled in the next week (off Lantus), please decrease the Amaryl to 2 mg 2x a day, before meals.  Please come on Monday, fasting, for labs.

## 2014-08-08 ENCOUNTER — Other Ambulatory Visit (INDEPENDENT_AMBULATORY_CARE_PROVIDER_SITE_OTHER): Payer: BLUE CROSS/BLUE SHIELD

## 2014-08-08 ENCOUNTER — Other Ambulatory Visit: Payer: Self-pay | Admitting: Internal Medicine

## 2014-08-08 DIAGNOSIS — M109 Gout, unspecified: Secondary | ICD-10-CM

## 2014-08-08 DIAGNOSIS — E785 Hyperlipidemia, unspecified: Secondary | ICD-10-CM

## 2014-08-08 LAB — COMPREHENSIVE METABOLIC PANEL
ALT: 15 U/L (ref 0–53)
AST: 16 U/L (ref 0–37)
Albumin: 4 g/dL (ref 3.5–5.2)
Alkaline Phosphatase: 52 U/L (ref 39–117)
BILIRUBIN TOTAL: 0.4 mg/dL (ref 0.2–1.2)
BUN: 22 mg/dL (ref 6–23)
CO2: 30 meq/L (ref 19–32)
CREATININE: 1.16 mg/dL (ref 0.40–1.50)
Calcium: 9.4 mg/dL (ref 8.4–10.5)
Chloride: 103 mEq/L (ref 96–112)
GFR: 82.83 mL/min (ref >60.00)
Glucose, Bld: 120 mg/dL — ABNORMAL HIGH (ref 70–99)
Potassium: 3.8 mEq/L (ref 3.5–5.1)
Sodium: 138 mEq/L (ref 135–145)
Total Protein: 7.2 g/dL (ref 6.0–8.3)

## 2014-08-08 LAB — URIC ACID: Uric Acid, Serum: 6.1 mg/dL (ref 4.0–7.8)

## 2014-08-08 LAB — LIPID PANEL
CHOLESTEROL: 155 mg/dL (ref 0–200)
HDL: 30.6 mg/dL — AB (ref >39.00)
LDL Cholesterol: 107 mg/dL — ABNORMAL HIGH (ref 0–99)
NonHDL: 124.4
TRIGLYCERIDES: 88 mg/dL (ref 0.0–149.0)
Total CHOL/HDL Ratio: 5
VLDL: 17.6 mg/dL (ref 0.0–40.0)

## 2014-08-08 NOTE — Addendum Note (Signed)
Addended by: Philemon Kingdom on: 08/08/2014 05:48 PM   Modules accepted: Level of Service

## 2014-09-23 ENCOUNTER — Other Ambulatory Visit: Payer: Self-pay | Admitting: Internal Medicine

## 2014-10-03 ENCOUNTER — Ambulatory Visit: Payer: Self-pay | Admitting: Internal Medicine

## 2014-10-03 ENCOUNTER — Other Ambulatory Visit: Payer: Self-pay | Admitting: Internal Medicine

## 2014-10-06 ENCOUNTER — Telehealth: Payer: Self-pay | Admitting: Internal Medicine

## 2014-10-06 MED ORDER — GLIMEPIRIDE 4 MG PO TABS: 4.0000 mg | ORAL_TABLET | Freq: Two times a day (BID) | ORAL | Status: DC

## 2014-10-06 NOTE — Telephone Encounter (Signed)
Pt called about concerns with medications please call [phone]

## 2014-10-06 NOTE — Telephone Encounter (Signed)
Refill of glimepiride sent to pt's pharmacy.

## 2014-10-06 NOTE — Telephone Encounter (Signed)
[facility] note dated 10/05/14 7:28 pm:  Caller states he needs refill on glyburide. States the dosage was increased and he is out fo the medication. States he has taken the med for today. Advised to call the office in the AM

## 2014-10-06 NOTE — Telephone Encounter (Signed)
Called pt and lvm advising him that we received the message from [facility] that he needs a new rx/refill of his glimepiride due to the increase in dosage. Refill sent to pt's pharmacy.

## 2014-10-18 ENCOUNTER — Other Ambulatory Visit: Payer: Self-pay | Admitting: *Deleted

## 2014-10-18 NOTE — Telephone Encounter (Signed)
Opened encounter in error  

## 2014-10-24 ENCOUNTER — Encounter: Payer: Self-pay | Admitting: Internal Medicine

## 2014-10-24 ENCOUNTER — Other Ambulatory Visit: Payer: Self-pay | Admitting: Internal Medicine

## 2014-10-24 ENCOUNTER — Ambulatory Visit (INDEPENDENT_AMBULATORY_CARE_PROVIDER_SITE_OTHER): Payer: BLUE CROSS/BLUE SHIELD | Admitting: Internal Medicine

## 2014-10-24 ENCOUNTER — Other Ambulatory Visit: Payer: Self-pay | Admitting: *Deleted

## 2014-10-24 VITALS — BP 130/78 | HR 82 | Temp 98.3°F | Resp 12 | Wt 257.6 lb

## 2014-10-24 DIAGNOSIS — I10 Essential (primary) hypertension: Secondary | ICD-10-CM

## 2014-10-24 DIAGNOSIS — E1165 Type 2 diabetes mellitus with hyperglycemia: Principal | ICD-10-CM

## 2014-10-24 DIAGNOSIS — IMO0001 Reserved for inherently not codable concepts without codable children: Secondary | ICD-10-CM

## 2014-10-24 LAB — HEMOGLOBIN A1C: HEMOGLOBIN A1C: 6.1 % (ref 4.6–6.5)

## 2014-10-24 MED ORDER — POTASSIUM CHLORIDE CRYS ER 20 MEQ PO TBCR: 20.0000 meq | EXTENDED_RELEASE_TABLET | Freq: Every day | ORAL | Status: DC

## 2014-10-24 MED ORDER — GLIMEPIRIDE 4 MG PO TABS: 4.0000 mg | ORAL_TABLET | Freq: Two times a day (BID) | ORAL | Status: DC

## 2014-10-24 MED ORDER — INSULIN GLARGINE 100 UNIT/ML SOLOSTAR PEN: 10.0000 [IU] | PEN_INJECTOR | Freq: Every day | SUBCUTANEOUS | Status: DC

## 2014-10-24 MED ORDER — CHLORTHALIDONE 25 MG PO TABS: 25.0000 mg | ORAL_TABLET | Freq: Every day | ORAL | Status: DC

## 2014-10-24 MED ORDER — SITAGLIPTIN PHOSPHATE 100 MG PO TABS: 100.0000 mg | ORAL_TABLET | Freq: Every day | ORAL | Status: DC

## 2014-10-24 MED ORDER — CANAGLIFLOZIN 100 MG PO TABS: 100.0000 mg | ORAL_TABLET | Freq: Every day | ORAL | Status: DC

## 2014-10-24 NOTE — Patient Instructions (Signed)
Please contnue: - Januvia 100 mg in am - Amaryl 4 mg 2x a day - move the second dose before dinner - Invokana 100 mg daily in am  Start Lantus 10 units at bedtime.  Please stop at the lab.  Please come back for a follow-up appointment in 3 months

## 2014-10-24 NOTE — Progress Notes (Signed)
Patient ID: Thomas Gardner, male   DOB: 1956/01/10, 59 y.o.   MRN: [id number]  HPI: Thomas Gardner is a 59 y.o.-year-old male,returning for f/u for DM2, dx in ~2003, insulin-dependent, uncontrolled, without complications. Last visit 3  mo ago.  Last hemoglobin A1c was: Lab Results  Component Value Date   HGBA1C 7.6* 06/13/2014   HGBA1C 10.3* 10/28/2011  He had steroid inj in knees >> sugar 564, not since last visit.  Pt is on a regimen of: - Januvia 100 mg in am - Amaryl 4 mg 2x a day - Invokana 100 mg daily - started 06/2014 - takes Lantus 10-15 units 5/7 days We stopped Apidra SSI: target 150, ISF 25 - 4x a day! - total 10-18 units He could not tolerate Metformin >> mood swings, confusion. He tried Janumet >> sugar fluctuations >> stopped Novolog 70/30 >> started in hospital 04/2014, but now can afford Lantus.  Pt checks his sugars 3x a day and they are: - am: 115-160 >> 98-115 >> 85-95 (on Lantus), 120-140 (on Lantus),159 this am  - 2h after b'fast: 160-185 >> 115 >> n/c - before lunch: 160-185 >> 115-120 >> 140s (off Lantus) - 2h after lunch: n/c >> 120-150 >> 160-180 - before dinner: 185-200 >> 66-85 (after Amaryl) >> 110-120 (on Lantus), 130-150 (off Lantus) - 2h after dinner: n/c - bedtime: 200-250 >> 118-120 >> ave 160 - nighttime: n/c Has lows every other day. Lowest sugar was 70 >> 80s; he has hypoglycemia awareness at 80.  Highest sugar was 565 with steroid inj; OTW 350 >> 214x 1 >> 189  Glucometer: One Touch Prime  Pt's meals are: - Breakfast: egg + sausage/bacon + cheese; Protein shake - Lunch: salad from [employer]; pasta; sometimes Whopper; fish fillet; japanese stir fry - Dinner: cereal (cheerios) + lact-aid milk; chicken, pasta or salad - Snacks: graham crackers; popcorn He exercises and stopped bread, sodas and sweet tea.  - no CKD, last BUN/creatinine:  Lab Results  Component Value Date   BUN 22 08/08/2014   CREATININE 1.16 08/08/2014  Not on ACEI. Was  on it in the past. - last set of lipids: Lab Results  Component Value Date   CHOL 155 08/08/2014   HDL 30.60* 08/08/2014   LDLCALC 107* 08/08/2014   TRIG 88.0 08/08/2014   CHOLHDL 5 08/08/2014  Was on Lipitor >> Nausea >> stopped few years ago. At last visit >> we started Crestor 5 mg daily. He tolerates this well Last LFTs: Lab Results  Component Value Date   ALT 15 08/08/2014   AST 16 08/08/2014   ALKPHOS 52 08/08/2014   BILITOT 0.4 08/08/2014   - last eye exam was in 2014. No DR.  - no numbness and tingling in his feet.  He also has h/o diastolic heart failure, HTN (On Chlorthalidone and Carvedilol), OSA, noncompliant with CPAP, recent upper GI bleed 2/2 PUD 2/2 NSAIDs.   He also has gout.  ROS: Constitutional: no weight loss/gain, no fatigue, + hot flushes Eyes: + blurry vision, no xerophthalmia ENT: no sore throat, no nodules palpated in throat, no dysphagia/odynophagia, no hoarseness, + tinnitus Cardiovascular: no CP/SOB/palpitations/+ leg swelling Respiratory: no cough/no SOB Gastrointestinal: no N/no V/+ D/noC, no acid reflux Musculoskeletal: no muscle/+ joint aches Skin: no rashes Neurological: no tremors/numbness/tingling/dizziness, + HA + diff with erections  I reviewed pt's medications, allergies, PMH, social hx, family hx, and changes were documented in the history of present illness. Otherwise, unchanged from my initial visit note.  Past Medical History  Diagnosis Date  . CHF (congestive heart failure)   . Diabetes mellitus   . Hypertension   . Arthritis   . Asthma   . GERD (gastroesophageal reflux disease)   . Hyperlipidemia   . Sleep apnea   . Morbid obesity    Past Surgical History  Procedure Laterality Date  . Cardiac catheterization    . Breath tek h pylori  11/11/2011    Procedure: BREATH TEK H PYLORI;  Surgeon: Shann Medal, MD;  Location: [facility];  Service: General;  Laterality: N/A;  . Esophagogastroduodenoscopy (egd) with propofol  N/A 05/04/2014    Procedure: ESOPHAGOGASTRODUODENOSCOPY (EGD) WITH PROPOFOL;  Surgeon: Cleotis Nipper, MD;  Location: [facility];  Service: Endoscopy;  Laterality: N/A;   History   Social History  . Marital Status: Single    Spouse Name: N/A    Number of Children: 0   Occupational History  . Cosmetology educator/manager   Social History Main Topics  . Smoking status: Never Smoker   . Smokeless tobacco: Not on file  . Alcohol Use: No  . Drug Use: No   Current Outpatient Prescriptions on File Prior to Visit  Medication Sig Dispense Refill  . albuterol (PROVENTIL,VENTOLIN) 90 MCG/ACT inhaler Inhale 1 puff into the lungs every 6 (six) hours as needed for wheezing or shortness of breath. For chest tightness or wheezing     . furosemide (LASIX) 40 MG tablet Take 40 mg by mouth daily.    Marland Kitchen glimepiride (AMARYL) 4 MG tablet Take 1 tablet (4 mg total) by mouth 2 (two) times daily before a meal. 60 tablet 2  . INVOKANA 100 MG TABS tablet TAKE ONE TABLET BY MOUTH ONCE DAILY 30 tablet 0  . ondansetron (ZOFRAN) 4 MG tablet Take 1 tablet (4 mg total) by mouth every 6 (six) hours as needed for nausea or vomiting. 12 tablet 0  . oxyCODONE-acetaminophen (PERCOCET/ROXICET) 5-325 MG per tablet Take 1-2 tablets by mouth every 4 (four) hours as needed for severe pain. 20 tablet 0  . pantoprazole (PROTONIX) 40 MG tablet Take 1 tablet (40 mg total) by mouth 2 (two) times daily before a meal. 60 tablet 1  . rosuvastatin (CRESTOR) 5 MG tablet Take 1 tablet (5 mg total) by mouth daily. 30 tablet 1  . sitaGLIPtin (JANUVIA) 50 MG tablet Take 50 mg by mouth daily.    . sucralfate (CARAFATE) 1 G tablet Take 1 tablet (1 g total) by mouth 4 (four) times daily. For 1 week 28 tablet 0  . Insulin Glargine (LANTUS) 100 UNIT/ML Solostar Pen Inject 20 Units into the skin daily at 10 pm.     No current facility-administered medications on file prior to visit.   Allergies  Allergen Reactions  . Erythromycin Nausea  And Vomiting   Family History  Problem Relation Age of Onset  . Cancer Mother     ovarian  . Diabetes Mother   . Stroke Father   . Cancer Maternal Aunt     breast  . Diabetes Maternal Aunt   . Cancer Maternal Uncle     colon   PE: BP 130/78 mmHg  Pulse 82  Temp(Src) 98.3 F (36.8 C) (Oral)  Resp 12  Wt 257 lb 9.6 oz (116.847 kg)  SpO2 95% Wt Readings from Last 3 Encounters:  10/24/14 257 lb 9.6 oz (116.847 kg)  08/02/14 249 lb (112.946 kg)  06/13/14 250 lb 12.8 oz (113.762 kg)   Constitutional: obese, in NAD  Eyes: PERRLA, EOMI, no exophthalmos ENT: moist mucous membranes, no thyromegaly, no cervical lymphadenopathy Cardiovascular: RRR, No MRG Respiratory: CTA B Gastrointestinal: abdomen soft, NT, ND, BS+ Musculoskeletal: no deformities, strength intact in all 4 Skin: moist, warm, no rashes Neurological: no tremor with outstretched hands, DTR normal in all 4  ASSESSMENT: 1. DM2, insulin-dependent, uncontrolled, without complications  2. HTN  3. HL  PLAN:  1. Patient with long-standing, uncontrolled diabetes, on oral + basal insulin antidiabetic regimen, now with dramatic improvement in sugars after adding Invokana and increasing Amaryl, but had to add back Lantus at a low dose - he ran out 2 weeks ago, but will need to restart. - I suggested to  Patient Instructions  Please contnue: - Januvia 100 mg in am - Amaryl 4 mg 2x a day - Invokana 100 mg daily in am  Start Lantus 10 units at bedtime.  Please stop at the lab.  Please come back for a follow-up appointment in 3 months  - continue checking sugars at different times of the day - check 3 times a day, rotating checks - given more sugar logs  - advised for yearly eye exams >> he needs one! - check HbA1c and BMP - Return to clinic in 3 mo with sugar log  2. HTN - we stopped Lasix since he was also on Chlorthalidone and we starrted Invokana.  - needs refill of Chlorthalidone and potassium - BP today is  great - managed by PCP  3. HL - Pt with long h/o HL, last lipid panel is from 2013 >> LDL in the 200s. I suggested Crestor 5 mg >> he now takes it every am >> continue this - Will check Lipids at next visit   Office Visit on 10/24/2014  Component Date Value Ref Range Status  . Hgb A1c MFr Bld 10/24/2014 6.1  4.6 - 6.5 % Final   Glycemic Control Guidelines for People with Diabetes:Non Diabetic:  <6%Goal of Therapy: <7%Additional Action Suggested:  >8%   . Sodium 10/24/2014 137  135 - 145 mEq/L Final  . Potassium 10/24/2014 3.4* 3.5 - 5.3 mEq/L Final  . Chloride 10/24/2014 99  96 - 112 mEq/L Final  . CO2 10/24/2014 26  19 - 32 mEq/L Final  . Glucose, Bld 10/24/2014 158* 70 - 99 mg/dL Final  . BUN 10/24/2014 22  6 - 23 mg/dL Final  . Creat 10/24/2014 1.06  0.50 - 1.35 mg/dL Final  . Calcium 10/24/2014 9.6  8.4 - 10.5 mg/dL Final  . GFR, Est African American 10/24/2014 88   Final  . GFR, Est Non African American 10/24/2014 76   Final   Comment:   The estimated GFR is a calculation valid for adults (>=31 years old) that uses the CKD-EPI algorithm to adjust for age and sex. It is   not to be used for children, pregnant women, hospitalized patients,    patients on dialysis, or with rapidly changing kidney function. According to the NKDEP, eGFR >89 is normal, 60-89 shows mild impairment, 30-59 shows moderate impairment, 15-29 shows severe impairment and <15 is ESRD.     Great hemoglobin A1c! Potassium slightly low, refilled his potassium supplements. Also refilled his chlorthalidone, per his request, although blood pressure is managed by PCP.

## 2014-10-25 LAB — BASIC METABOLIC PANEL WITH GFR
BUN: 22 mg/dL (ref 6–23)
CALCIUM: 9.6 mg/dL (ref 8.4–10.5)
CHLORIDE: 99 meq/L (ref 96–112)
CO2: 26 meq/L (ref 19–32)
Creat: 1.06 mg/dL (ref 0.50–1.35)
GFR, Est African American: 88 mL/min
GFR, Est Non African American: 76 mL/min
Glucose, Bld: 158 mg/dL — ABNORMAL HIGH (ref 70–99)
POTASSIUM: 3.4 meq/L — AB (ref 3.5–5.3)
SODIUM: 137 meq/L (ref 135–145)

## 2014-12-07 ENCOUNTER — Encounter (HOSPITAL_BASED_OUTPATIENT_CLINIC_OR_DEPARTMENT_OTHER): Payer: Self-pay

## 2014-12-07 ENCOUNTER — Telehealth: Payer: Self-pay | Admitting: *Deleted

## 2014-12-07 ENCOUNTER — Emergency Department (HOSPITAL_BASED_OUTPATIENT_CLINIC_OR_DEPARTMENT_OTHER)
Admission: EM | Admit: 2014-12-07 | Discharge: 2014-12-07 | Disposition: A | Discharge status: Home / Self Care | Payer: BLUE CROSS/BLUE SHIELD | Attending: Emergency Medicine | Admitting: Emergency Medicine

## 2014-12-07 DIAGNOSIS — Z9889 Other specified postprocedural states: Secondary | ICD-10-CM | POA: Diagnosis not present

## 2014-12-07 DIAGNOSIS — R51 Headache: Secondary | ICD-10-CM | POA: Diagnosis not present

## 2014-12-07 DIAGNOSIS — Z794 Long term (current) use of insulin: Secondary | ICD-10-CM | POA: Diagnosis not present

## 2014-12-07 DIAGNOSIS — R42 Dizziness and giddiness: Secondary | ICD-10-CM | POA: Insufficient documentation

## 2014-12-07 DIAGNOSIS — I10 Essential (primary) hypertension: Secondary | ICD-10-CM | POA: Diagnosis not present

## 2014-12-07 DIAGNOSIS — K219 Gastro-esophageal reflux disease without esophagitis: Secondary | ICD-10-CM | POA: Diagnosis not present

## 2014-12-07 DIAGNOSIS — E119 Type 2 diabetes mellitus without complications: Secondary | ICD-10-CM | POA: Diagnosis not present

## 2014-12-07 DIAGNOSIS — M1990 Unspecified osteoarthritis, unspecified site: Secondary | ICD-10-CM | POA: Diagnosis not present

## 2014-12-07 DIAGNOSIS — Z8669 Personal history of other diseases of the nervous system and sense organs: Secondary | ICD-10-CM | POA: Insufficient documentation

## 2014-12-07 DIAGNOSIS — R531 Weakness: Secondary | ICD-10-CM | POA: Diagnosis not present

## 2014-12-07 DIAGNOSIS — E785 Hyperlipidemia, unspecified: Secondary | ICD-10-CM | POA: Insufficient documentation

## 2014-12-07 DIAGNOSIS — Z79899 Other long term (current) drug therapy: Secondary | ICD-10-CM | POA: Insufficient documentation

## 2014-12-07 DIAGNOSIS — I509 Heart failure, unspecified: Secondary | ICD-10-CM | POA: Insufficient documentation

## 2014-12-07 DIAGNOSIS — J45909 Unspecified asthma, uncomplicated: Secondary | ICD-10-CM | POA: Diagnosis not present

## 2014-12-07 DIAGNOSIS — R5383 Other fatigue: Secondary | ICD-10-CM | POA: Diagnosis present

## 2014-12-07 LAB — CBG MONITORING, ED: GLUCOSE-CAPILLARY: 160 mg/dL — AB (ref 65–99)

## 2014-12-07 LAB — BASIC METABOLIC PANEL
ANION GAP: 5 (ref 5–15)
BUN: 25 mg/dL — AB (ref 6–20)
CHLORIDE: 103 mmol/L (ref 101–111)
CO2: 30 mmol/L (ref 22–32)
CREATININE: 1.2 mg/dL (ref 0.61–1.24)
Calcium: 9.1 mg/dL (ref 8.9–10.3)
GFR calc Af Amer: >60 mL/min (ref >60)
GFR calc non Af Amer: >60 mL/min (ref >60)
Glucose, Bld: 129 mg/dL — ABNORMAL HIGH (ref 65–99)
Potassium: 3.3 mmol/L — ABNORMAL LOW (ref 3.5–5.1)
Sodium: 138 mmol/L (ref 135–145)

## 2014-12-07 LAB — CBC WITH DIFFERENTIAL/PLATELET
BASOS PCT: 0 % (ref 0–1)
Basophils Absolute: 0 10*3/uL (ref 0.0–0.1)
Eosinophils Absolute: 0.4 10*3/uL (ref 0.0–0.7)
Eosinophils Relative: 4 % (ref 0–5)
HCT: 43.4 % (ref 39.0–52.0)
Hemoglobin: 14 g/dL (ref 13.0–17.0)
LYMPHS ABS: 2.3 10*3/uL (ref 0.7–4.0)
Lymphocytes Relative: 27 % (ref 12–46)
MCH: 25.4 pg — AB (ref 26.0–34.0)
MCHC: 32.3 g/dL (ref 30.0–36.0)
MCV: 78.6 fL (ref 78.0–100.0)
MONO ABS: 0.9 10*3/uL (ref 0.1–1.0)
Monocytes Relative: 11 % (ref 3–12)
Neutro Abs: 4.8 10*3/uL (ref 1.7–7.7)
Neutrophils Relative %: 58 % (ref 43–77)
PLATELETS: 173 10*3/uL (ref 150–400)
RBC: 5.52 MIL/uL (ref 4.22–5.81)
RDW: 17.1 % — AB (ref 11.5–15.5)
WBC: 8.4 10*3/uL (ref 4.0–10.5)

## 2014-12-07 LAB — TROPONIN I

## 2014-12-07 MED ORDER — CARVEDILOL 25 MG PO TABS: 25.0000 mg | ORAL_TABLET | Freq: Two times a day (BID) | ORAL | Status: DC

## 2014-12-07 MED ORDER — POTASSIUM CHLORIDE CRYS ER 20 MEQ PO TBCR
40.0000 meq | EXTENDED_RELEASE_TABLET | Freq: Once | ORAL | Status: AC
  Administered 2014-12-07: 40 meq via ORAL
  Filled 2014-12-07: qty 2

## 2014-12-07 NOTE — ED Notes (Signed)
Denies chest pain and SOB- c/o headache only at this time- DR Jacubowitz at bedside evaluating pt

## 2014-12-07 NOTE — Telephone Encounter (Signed)
Called pt and lvm advising him per Dr Gherghe's message below.  

## 2014-12-07 NOTE — Telephone Encounter (Signed)
Pt calling the med is carvedilol 25 mg

## 2014-12-07 NOTE — Telephone Encounter (Signed)
Pt called and lvm stating that Dr Cruzita Lederer is the only physician he is seeing at this time. Pt stated he needs a refill of his carvedilol. Pt is out. I did not see this medication in his med list. Called pt and lvm advising him that there is no guarantee that Dr Cruzita Lederer will refill this. Advised pt that we do not have it on his med list and we will need that. Be advised.

## 2014-12-07 NOTE — Discharge Instructions (Signed)
Fatigue Keep your scheduled appointment with Dr.Avbuere next week Fatigue is a feeling of tiredness, lack of energy, lack of motivation, or feeling tired all the time. Having enough rest, good nutrition, and reducing stress will normally reduce fatigue. Consult your caregiver if it persists. The nature of your fatigue will help your caregiver to find out its cause. The treatment is based on the cause.  CAUSES  There are many causes for fatigue. Most of the time, fatigue can be traced to one or more of your habits or routines. Most causes fit into one or more of three general areas. They are: Lifestyle problems  Sleep disturbances.  Overwork.  Physical exertion.  Unhealthy habits.  Poor eating habits or eating disorders.  Alcohol and/or drug use .  Lack of proper nutrition (malnutrition). Psychological problems  Stress and/or anxiety problems.  Depression.  Grief.  Boredom. Medical Problems or Conditions  Anemia.  Pregnancy.  Thyroid gland problems.  Recovery from major surgery.  Continuous pain.  Emphysema or asthma that is not well controlled  Allergic conditions.  Diabetes.  Infections (such as mononucleosis).  Obesity.  Sleep disorders, such as sleep apnea.  Heart failure or other heart-related problems.  Cancer.  Kidney disease.  Liver disease.  Effects of certain medicines such as antihistamines, cough and cold remedies, prescription pain medicines, heart and blood pressure medicines, drugs used for treatment of cancer, and some antidepressants. SYMPTOMS  The symptoms of fatigue include:   Lack of energy.  Lack of drive (motivation).  Drowsiness.  Feeling of indifference to the surroundings. DIAGNOSIS  The details of how you feel help guide your caregiver in finding out what is causing the fatigue. You will be asked about your present and past health condition. It is important to review all medicines that you take, including prescription  and non-prescription items. A thorough exam will be done. You will be questioned about your feelings, habits, and normal lifestyle. Your caregiver may suggest blood tests, urine tests, or other tests to look for common medical causes of fatigue.  TREATMENT  Fatigue is treated by correcting the underlying cause. For example, if you have continuous pain or depression, treating these causes will improve how you feel. Similarly, adjusting the dose of certain medicines will help in reducing fatigue.  HOME CARE INSTRUCTIONS   Try to get the required amount of good sleep every night.  Eat a healthy and nutritious diet, and drink enough water throughout the day.  Practice ways of relaxing (including yoga or meditation).  Exercise regularly.  Make plans to change situations that cause stress. Act on those plans so that stresses decrease over time. Keep your work and personal routine reasonable.  Avoid street drugs and minimize use of alcohol.  Start taking a daily multivitamin after consulting your caregiver. SEEK MEDICAL CARE IF:   You have persistent tiredness, which cannot be accounted for.  You have fever.  You have unintentional weight loss.  You have headaches.  You have disturbed sleep throughout the night.  You are feeling sad.  You have constipation.  You have dry skin.  You have gained weight.  You are taking any new or different medicines that you suspect are causing fatigue.  You are unable to sleep at night.  You develop any unusual swelling of your legs or other parts of your body. SEEK IMMEDIATE MEDICAL CARE IF:   You are feeling confused.  Your vision is blurred.  You feel faint or pass out.  You develop severe  headache.  You develop severe abdominal, pelvic, or back pain.  You develop chest pain, shortness of breath, or an irregular or fast heartbeat.  You are unable to pass a normal amount of urine.  You develop abnormal bleeding such as bleeding  from the rectum or you vomit blood.  You have thoughts about harming yourself or committing suicide.  You are worried that you might harm someone else. MAKE SURE YOU:   Understand these instructions.  Will watch your condition.  Will get help right away if you are not doing well or get worse. Document Released: 03/03/2007 Document Revised: 07/29/2011 Document Reviewed: 09/07/2013 [facility] Patient Information 2015 [place], [place]. This information is not intended to replace advice given to you by your health care provider. Make sure you discuss any questions you have with your health care provider.

## 2014-12-07 NOTE — ED Provider Notes (Signed)
CSN: [id number]     Arrival date & time 12/07/14  1802 History   First MD Initiated Contact with Patient 12/07/14 1816     Chief Complaint  Patient presents with  . Headache     (Consider location/radiation/quality/duration/timing/severity/associated sxs/prior Treatment) HPI Patient awakened this morning with mild diffuse headache which became progressively worse this afternoon. Symptoms occurred by lightheadedness. He felt that it might be secondary to being in the heat. He feels improved since being here. No treatment prior to coming here. No chest pain no shortness of breath no nausea no sweatiness. So stated symptoms. He came here to get "checked out" Past Medical History  Diagnosis Date  . CHF (congestive heart failure)   . Diabetes mellitus   . Hypertension   . Arthritis   . Asthma   . GERD (gastroesophageal reflux disease)   . Hyperlipidemia   . Sleep apnea   . Morbid obesity    Past Surgical History  Procedure Laterality Date  . Cardiac catheterization    . Breath tek h pylori  11/11/2011    Procedure: BREATH TEK H PYLORI;  Surgeon: Shann Medal, MD;  Location: [facility];  Service: General;  Laterality: N/A;  . Esophagogastroduodenoscopy (egd) with propofol N/A 05/04/2014    Procedure: ESOPHAGOGASTRODUODENOSCOPY (EGD) WITH PROPOFOL;  Surgeon: Cleotis Nipper, MD;  Location: [facility];  Service: Endoscopy;  Laterality: N/A;   Family History  Problem Relation Age of Onset  . Cancer Mother     ovarian  . Diabetes Mother   . Stroke Father   . Cancer Maternal Aunt     breast  . Diabetes Maternal Aunt   . Cancer Maternal Uncle     colon   History  Substance Use Topics  . Smoking status: Never Smoker   . Smokeless tobacco: Not on file  . Alcohol Use: No    Review of Systems  Constitutional: Positive for fatigue.  Respiratory: Negative.   Cardiovascular: Negative.   Gastrointestinal: Negative.   Musculoskeletal: Negative.   Skin: Negative.    Neurological: Positive for light-headedness and headaches.  Psychiatric/Behavioral: Negative.   All other systems reviewed and are negative.     Allergies  Erythromycin  Home Medications   Prior to Admission medications   Medication Sig Start Date End Date Taking? Authorizing Provider  carvedilol (COREG) 25 MG tablet Take 25 mg by mouth 2 (two) times daily with a meal.   Yes Historical Provider, MD  albuterol (PROVENTIL,VENTOLIN) 90 MCG/ACT inhaler Inhale 1 puff into the lungs every 6 (six) hours as needed for wheezing or shortness of breath. For chest tightness or wheezing     Historical Provider, MD  canagliflozin (INVOKANA) 100 MG TABS tablet Take 1 tablet (100 mg total) by mouth daily. 10/24/14   Philemon Kingdom, MD  chlorthalidone (HYGROTON) 25 MG tablet Take 1 tablet (25 mg total) by mouth daily. 10/24/14   Philemon Kingdom, MD  glimepiride (AMARYL) 4 MG tablet Take 1 tablet (4 mg total) by mouth 2 (two) times daily before a meal. 10/24/14   Philemon Kingdom, MD  Insulin Glargine (LANTUS) 100 UNIT/ML Solostar Pen Inject 10 Units into the skin daily at 10 pm. 10/24/14   Philemon Kingdom, MD  INVOKANA 100 MG TABS tablet TAKE ONE TABLET BY MOUTH ONCE DAILY 10/24/14   Philemon Kingdom, MD  ondansetron (ZOFRAN) 4 MG tablet Take 1 tablet (4 mg total) by mouth every 6 (six) hours as needed for nausea or vomiting. 30/07/62   Delora Fuel,  MD  oxyCODONE-acetaminophen (PERCOCET/ROXICET) 5-325 MG per tablet Take 1-2 tablets by mouth every 4 (four) hours as needed for severe pain. 05/16/14   Leonard Schwartz, MD  pantoprazole (PROTONIX) 40 MG tablet Take 1 tablet (40 mg total) by mouth 2 (two) times daily before a meal. 05/05/14   Geradine Girt, DO  potassium chloride SA (K-DUR,KLOR-CON) 20 MEQ tablet Take 1 tablet (20 mEq total) by mouth daily. 10/24/14   Philemon Kingdom, MD  rosuvastatin (CRESTOR) 5 MG tablet Take 1 tablet (5 mg total) by mouth daily. 06/13/14   Philemon Kingdom, MD  sitaGLIPtin (JANUVIA)  100 MG tablet Take 1 tablet (100 mg total) by mouth daily. 10/24/14   Philemon Kingdom, MD  sucralfate (CARAFATE) 1 G tablet Take 1 tablet (1 g total) by mouth 4 (four) times daily. For 1 week 05/05/14   Tomi Bamberger Vann, DO   BP 137/68 mmHg  Pulse 89  Temp(Src) 98.7 F (37.1 C) (Oral)  Resp 18  Ht 5\' 6"  (1.676 m)  Wt 260 lb (117.935 kg)  BMI 41.99 kg/m2  SpO2 95% Physical Exam  Constitutional: He is oriented to person, place, and time. He appears well-developed and well-nourished.  HENT:  Head: Normocephalic and atraumatic.  Eyes: Conjunctivae are normal. Pupils are equal, round, and reactive to light.  Neck: Neck supple. No tracheal deviation present. No thyromegaly present.  Cardiovascular: Normal rate and regular rhythm.   No murmur heard. Pulmonary/Chest: Effort normal and breath sounds normal.  Abdominal: Soft. Bowel sounds are normal. He exhibits no distension. There is no tenderness.  Musculoskeletal: Normal range of motion. He exhibits no edema or tenderness.  Neurological: He is alert and oriented to person, place, and time. No cranial nerve deficit. Coordination normal.  Gait normal Romberg normal pronator drift normal. Not lightheaded on standing  Skin: Skin is warm and dry. No rash noted.  Psychiatric: He has a normal mood and affect.  Nursing note and vitals reviewed.   ED Course  Procedures (including critical care time) Labs Review Labs Reviewed  CBG MONITORING, ED - Abnormal; Notable for the following:    Glucose-Capillary 160 (*)    All other components within normal limits    Imaging Review No results found.   EKG Interpretation None     ED ECG REPORT   Date: 12/07/2014  Rate: 85  Rhythm: normal sinus rhythm  QRS Axis: left  Intervals: normal  ST/T Wave abnormalities: normal  Conduction Disutrbances:none  Narrative Interpretation:   Old EKG Reviewed: Unchanged from 05/03/2014 as interpreted by me  I have personally reviewed the EKG tracing and  agree with the computerized printout as noted.  8:30 PM patient continues to feel improved. Results for orders placed or performed during the hospital encounter of 12/07/14  CBC with Differential/Platelet  Result Value Ref Range   WBC 8.4 4.0 - 10.5 K/uL   RBC 5.52 4.22 - 5.81 MIL/uL   Hemoglobin 14.0 13.0 - 17.0 g/dL   HCT 43.4 39.0 - 52.0 %   MCV 78.6 78.0 - 100.0 fL   MCH 25.4 (L) 26.0 - 34.0 pg   MCHC 32.3 30.0 - 36.0 g/dL   RDW 17.1 (H) 11.5 - 15.5 %   Platelets 173 150 - 400 K/uL   Neutrophils Relative % 58 43 - 77 %   Neutro Abs 4.8 1.7 - 7.7 K/uL   Lymphocytes Relative 27 12 - 46 %   Lymphs Abs 2.3 0.7 - 4.0 K/uL   Monocytes Relative 11 3 -  12 %   Monocytes Absolute 0.9 0.1 - 1.0 K/uL   Eosinophils Relative 4 0 - 5 %   Eosinophils Absolute 0.4 0.0 - 0.7 K/uL   Basophils Relative 0 0 - 1 %   Basophils Absolute 0.0 0.0 - 0.1 K/uL  Basic metabolic panel  Result Value Ref Range   Sodium 138 135 - 145 mmol/L   Potassium 3.3 (L) 3.5 - 5.1 mmol/L   Chloride 103 101 - 111 mmol/L   CO2 30 22 - 32 mmol/L   Glucose, Bld 129 (H) 65 - 99 mg/dL   BUN 25 (H) 6 - 20 mg/dL   Creatinine, Ser 1.20 0.61 - 1.24 mg/dL   Calcium 9.1 8.9 - 10.3 mg/dL   GFR calc non Af Amer >60 >60 mL/min   GFR calc Af Amer >60 >60 mL/min   Anion gap 5 5 - 15  Troponin I  Result Value Ref Range   Troponin I <0.03 <0.031 ng/mL  POC CBG, ED  Result Value Ref Range   Glucose-Capillary 160 (H) 65 - 99 mg/dL   Comment 1 Notify RN    Comment 2 Document in Chart    No results found.  MDM  Strongly doubt acute coronary syndrome or silent ischemia with normal EKG negative troponin. Patient requesting prescription for Coreg, as she's run out. He has an appointment with his primary care physician Dr.Avbuere next week and he'll get potassium supplementation by mouth prior to discharge Final diagnoses:  None   diagnosis #1 nonspecific headache #2 weakness #3 hypokalemia #19medication refill      Orlie Dakin, MD 12/07/14 2040

## 2014-12-07 NOTE — ED Notes (Signed)
Woke with HA this am-feeling light headed, tired at work approx 1030am

## 2014-12-07 NOTE — Telephone Encounter (Signed)
I'm not comfortable prescribing this, especially since is not on his medication list. He has congestive heart failure and needs to have a PCP or cardiologist. Please try to obtain a refill from the doctor that originally prescribed it and try to establish care with a PCP ASAP.

## 2014-12-07 NOTE — ED Notes (Signed)
MD at bedside. 

## 2015-01-25 ENCOUNTER — Ambulatory Visit: Payer: Self-pay | Admitting: Internal Medicine

## 2015-02-06 ENCOUNTER — Ambulatory Visit (INDEPENDENT_AMBULATORY_CARE_PROVIDER_SITE_OTHER): Payer: BLUE CROSS/BLUE SHIELD | Admitting: Internal Medicine

## 2015-02-06 ENCOUNTER — Encounter: Payer: Self-pay | Admitting: Internal Medicine

## 2015-02-06 VITALS — BP 120/58 | HR 82 | Temp 98.2°F | Resp 12 | Wt 258.0 lb

## 2015-02-06 DIAGNOSIS — E1165 Type 2 diabetes mellitus with hyperglycemia: Secondary | ICD-10-CM | POA: Diagnosis not present

## 2015-02-06 DIAGNOSIS — E785 Hyperlipidemia, unspecified: Secondary | ICD-10-CM | POA: Diagnosis not present

## 2015-02-06 DIAGNOSIS — IMO0001 Reserved for inherently not codable concepts without codable children: Secondary | ICD-10-CM

## 2015-02-06 MED ORDER — GLIMEPIRIDE 4 MG PO TABS: 4.0000 mg | ORAL_TABLET | Freq: Two times a day (BID) | ORAL | Status: DC

## 2015-02-06 MED ORDER — CANAGLIFLOZIN 100 MG PO TABS: 100.0000 mg | ORAL_TABLET | Freq: Every day | ORAL | Status: DC

## 2015-02-06 NOTE — Patient Instructions (Addendum)
Please continue: - Amaryl 4 mg 2x a day >> please consider decreasing the Amaryl to 2 mg in am if sugars after breakfast are still low - Invokana 100 mg daily Please try to stop Lantus 10 units at bedtime.   Please come back in am, fasting, for labs.  Please come back for a follow-up appointment in 3 months with sugar log.

## 2015-02-06 NOTE — Progress Notes (Signed)
Patient ID: Thomas Gardner, male   DOB: [birth year], 59 y.o.   MRN: [id number]  HPI: Thomas Gardner is a 59 y.o.-year-old male,returning for f/u for DM2, dx in ~2003, insulin-dependent, uncontrolled, without complications. Last visit 3  mo ago.  Last hemoglobin A1c was: Lab Results  Component Value Date   HGBA1C 6.1 10/24/2014   HGBA1C 7.6* 06/13/2014   HGBA1C 10.3* 10/28/2011  He had steroid inj in knees >> sugar 564, not since last visit.  Pt is on a regimen of: - Amaryl 4 mg 2x a day - Invokana 100 mg daily - started 06/2014 - Lantus 10 units at bedtime  We stopped Januvia (50$ per month) >1 mo ago - cost. We stopped Apidra SSI: target 150, ISF 25 - 4x a day! - total 10-18 units He could not tolerate Metformin >> mood swings, confusion. He tried Janumet >> sugar fluctuations >> stopped Novolog 70/30 >> started in hospital 04/2014, but now can afford Lantus.  Pt checks his sugars 3x a day and they are: - am: 115-160 >> 98-115 >> 85-95 (on Lantus), 120-140 (off Lantus),159 this am >> 95-105, 125 - 2h after b'fast: 160-185 >> 115 >> n/c - before lunch: 160-185 >> 115-120 >> 140s (off Lantus) >> 80s-120 - 2h after lunch: n/c >> 120-150 >> 160-180  - before dinner: 185-200 >> 66-85 (after Amaryl) >> 110-120 (on Lantus), 130-150 (off Lantus) >> 150s - 2h after dinner: n/c - bedtime: 200-250 >> 118-120 >> ave 160 >> 150-160 - nighttime: n/c Has lows every other day. Lowest sugar was 70 >> 80s; he has hypoglycemia awareness at 80.  Highest sugar was 565 with steroid inj; OTW 350 >> 214x 1 >> 189 >> 220 x1  Glucometer: One Touch Prime  Pt's meals are: - Breakfast: egg + sausage/bacon + cheese; Protein shake - Lunch: salad from [employer]; pasta; sometimes Whopper; fish fillet; japanese stir fry - Dinner: cereal (cheerios) + lact-aid milk; chicken, pasta or salad - Snacks: graham crackers; popcorn He exercises and stopped bread, sodas and sweet tea.  - no CKD, last BUN/creatinine:   Lab Results  Component Value Date   BUN 25* 12/07/2014   CREATININE 1.20 12/07/2014  Not on ACEI. Was on it in the past. - last set of lipids: Lab Results  Component Value Date   CHOL 155 08/08/2014   HDL 30.60* 08/08/2014   LDLCALC 107* 08/08/2014   TRIG 88.0 08/08/2014   CHOLHDL 5 08/08/2014  Was on Lipitor >> Nausea >> stopped few years ago. We started Crestor 5 mg qodaily. He tolerates this well. Last LFTs: Lab Results  Component Value Date   ALT 15 08/08/2014   AST 16 08/08/2014   ALKPHOS 52 08/08/2014   BILITOT 0.4 08/08/2014   - last eye exam was in 2014. No DR. Has one set up. - no numbness and tingling in his feet.  He also has h/o diastolic heart failure, HTN (On Chlorthalidone and Carvedilol), OSA, noncompliant with CPAP, recent upper GI bleed 2/2 PUD 2/2 NSAIDs.   He also has gout.  ROS: Constitutional: no weight loss/gain, + fatigue, + poor sleep Eyes: + blurry vision, no xerophthalmia ENT: no sore throat, no nodules palpated in throat, no dysphagia/odynophagia, no hoarseness Cardiovascular: no CP/SOB/palpitations/+ leg swelling Respiratory: no cough/no SOB Gastrointestinal: no N/no V/+ D/noC, no acid reflux Musculoskeletal: no muscle/+ joint aches Skin: no rashes Neurological: no tremors/numbness/tingling/dizziness  I reviewed pt's medications, allergies, PMH, social hx, family hx, and changes were documented in  the history of present illness. Otherwise, unchanged from my initial visit note.  Past Medical History  Diagnosis Date  . CHF (congestive heart failure)   . Diabetes mellitus   . Hypertension   . Arthritis   . Asthma   . GERD (gastroesophageal reflux disease)   . Hyperlipidemia   . Sleep apnea   . Morbid obesity    Past Surgical History  Procedure Laterality Date  . Cardiac catheterization    . Breath tek h pylori  11/11/2011    Procedure: BREATH TEK H PYLORI;  Surgeon: Shann Medal, MD;  Location: [facility];  Service: General;   Laterality: N/A;  . Esophagogastroduodenoscopy (egd) with propofol N/A 05/04/2014    Procedure: ESOPHAGOGASTRODUODENOSCOPY (EGD) WITH PROPOFOL;  Surgeon: Cleotis Nipper, MD;  Location: [facility];  Service: Endoscopy;  Laterality: N/A;   History   Social History  . Marital Status: Single    Spouse Name: N/A    Number of Children: 0   Occupational History  . Cosmetology educator/manager   Social History Main Topics  . Smoking status: Never Smoker   . Smokeless tobacco: Not on file  . Alcohol Use: No  . Drug Use: No   Current Outpatient Prescriptions on File Prior to Visit  Medication Sig Dispense Refill  . albuterol (PROVENTIL,VENTOLIN) 90 MCG/ACT inhaler Inhale 1 puff into the lungs every 6 (six) hours as needed for wheezing or shortness of breath. For chest tightness or wheezing     . canagliflozin (INVOKANA) 100 MG TABS tablet Take 1 tablet (100 mg total) by mouth daily. 90 tablet 1  . carvedilol (COREG) 25 MG tablet Take 25 mg by mouth 2 (two) times daily with a meal.    . carvedilol (COREG) 25 MG tablet Take 1 tablet (25 mg total) by mouth 2 (two) times daily with a meal. 20 tablet 0  . chlorthalidone (HYGROTON) 25 MG tablet Take 1 tablet (25 mg total) by mouth daily. 90 tablet 1  . glimepiride (AMARYL) 4 MG tablet Take 1 tablet (4 mg total) by mouth 2 (two) times daily before a meal. 180 tablet 1  . Insulin Glargine (LANTUS) 100 UNIT/ML Solostar Pen Inject 10 Units into the skin daily at 10 pm. 15 mL 2  . INVOKANA 100 MG TABS tablet TAKE ONE TABLET BY MOUTH ONCE DAILY 30 tablet 2  . ondansetron (ZOFRAN) 4 MG tablet Take 1 tablet (4 mg total) by mouth every 6 (six) hours as needed for nausea or vomiting. 12 tablet 0  . oxyCODONE-acetaminophen (PERCOCET/ROXICET) 5-325 MG per tablet Take 1-2 tablets by mouth every 4 (four) hours as needed for severe pain. 20 tablet 0  . pantoprazole (PROTONIX) 40 MG tablet Take 1 tablet (40 mg total) by mouth 2 (two) times daily before a meal.  60 tablet 1  . potassium chloride SA (K-DUR,KLOR-CON) 20 MEQ tablet Take 1 tablet (20 mEq total) by mouth daily. 30 tablet 3  . rosuvastatin (CRESTOR) 5 MG tablet Take 1 tablet (5 mg total) by mouth daily. 30 tablet 1  . sitaGLIPtin (JANUVIA) 100 MG tablet Take 1 tablet (100 mg total) by mouth daily. 90 tablet 1  . sucralfate (CARAFATE) 1 G tablet Take 1 tablet (1 g total) by mouth 4 (four) times daily. For 1 week 28 tablet 0   No current facility-administered medications on file prior to visit.   Allergies  Allergen Reactions  . Erythromycin Nausea And Vomiting   Family History  Problem Relation Age of Onset  .  Cancer Mother     ovarian  . Diabetes Mother   . Stroke Father   . Cancer Maternal Aunt     breast  . Diabetes Maternal Aunt   . Cancer Maternal Uncle     colon   PE: BP 120/58 mmHg  Pulse 82  Temp(Src) 98.2 F (36.8 C) (Oral)  Resp 12  Wt 258 lb (117.028 kg)  SpO2 97% Body mass index is 41.66 kg/(m^2). Wt Readings from Last 3 Encounters:  02/06/15 258 lb (117.028 kg)  12/07/14 260 lb (117.935 kg)  10/24/14 257 lb 9.6 oz (116.847 kg)   Constitutional: obese, in NAD Eyes: PERRLA, EOMI, no exophthalmos ENT: moist mucous membranes, no thyromegaly, no cervical lymphadenopathy Cardiovascular: RRR, No MRG Respiratory: CTA B Gastrointestinal: abdomen soft, NT, ND, BS+ Musculoskeletal: no deformities, strength intact in all 4 Skin: moist, warm, no rashes Neurological: no tremor with outstretched hands, DTR normal in all 4  ASSESSMENT: 1. DM2, insulin-dependent, uncontrolled, without complications  2. HL  PLAN:  1. Patient with long-standing, uncontrolled diabetes, on oral + basal insulin antidiabetic regimen, now with dramatic improvement in sugars after adding Invokana and increasing Amaryl. We will try again to stop Lantus and may need a decrease in am Amaryl also. He needs to bring me his meter/sugar log at next visit. - I suggested to  Patient Instructions   Please continue: - Amaryl 4 mg 2x a day >> please consider decreasing the Amaryl to 2 mg in am if sugars after breakfast are still low - Invokana 100 mg daily Please try to stop Lantus 10 units at bedtime.   Please come back in am, fasting, for labs.  Please come back for a follow-up appointment in 3 months with sugar log.  - continue checking sugars at different times of the day - check 3 times a day, rotating checks - given more sugar logs  - advised for yearly eye exams >> he needs one! >> has this schedules - check HbA1c at next lab draw - Return to clinic in 3 mo with sugar log  2. HL - Pt with long h/o HL, last lipid panel is from 2013 >> LDL in the 200s. I suggested Crestor 5 mg >> he now takes it every other am >> no SEs. He has some upper arm mm pain, off and on, not bothersome - Will check Lipids + LFTs - fasting   Pt did not stop at the lab.Marland KitchenMarland Kitchen

## 2015-02-17 ENCOUNTER — Other Ambulatory Visit: Payer: Self-pay | Admitting: Internal Medicine

## 2015-03-13 ENCOUNTER — Ambulatory Visit: Payer: Self-pay | Admitting: Internal Medicine

## 2015-03-14 ENCOUNTER — Ambulatory Visit: Payer: Self-pay | Admitting: Internal Medicine

## 2015-03-17 ENCOUNTER — Encounter: Payer: Self-pay | Admitting: Internal Medicine

## 2015-03-17 ENCOUNTER — Ambulatory Visit (INDEPENDENT_AMBULATORY_CARE_PROVIDER_SITE_OTHER): Payer: Self-pay | Admitting: Internal Medicine

## 2015-03-17 VITALS — BP 130/70 | HR 72 | Temp 98.3°F | Ht 66.0 in | Wt 259.0 lb

## 2015-03-17 DIAGNOSIS — E785 Hyperlipidemia, unspecified: Secondary | ICD-10-CM

## 2015-03-17 DIAGNOSIS — E1165 Type 2 diabetes mellitus with hyperglycemia: Secondary | ICD-10-CM

## 2015-03-17 DIAGNOSIS — IMO0001 Reserved for inherently not codable concepts without codable children: Secondary | ICD-10-CM

## 2015-03-17 DIAGNOSIS — Z0000 Encounter for general adult medical examination without abnormal findings: Secondary | ICD-10-CM

## 2015-03-17 DIAGNOSIS — I5032 Chronic diastolic (congestive) heart failure: Secondary | ICD-10-CM

## 2015-03-17 DIAGNOSIS — Z794 Long term (current) use of insulin: Secondary | ICD-10-CM

## 2015-03-17 DIAGNOSIS — Z7984 Long term (current) use of oral hypoglycemic drugs: Secondary | ICD-10-CM

## 2015-03-17 DIAGNOSIS — I10 Essential (primary) hypertension: Secondary | ICD-10-CM

## 2015-03-17 DIAGNOSIS — I110 Hypertensive heart disease with heart failure: Secondary | ICD-10-CM

## 2015-03-17 LAB — POCT GLYCOSYLATED HEMOGLOBIN (HGB A1C): Hemoglobin A1C: 7.2

## 2015-03-17 LAB — GLUCOSE, CAPILLARY: Glucose-Capillary: 118 mg/dL — ABNORMAL HIGH (ref 65–99)

## 2015-03-17 NOTE — Assessment & Plan Note (Signed)
Pertinent Labs: Liver Function Tests:    Component Value Date/Time   AST 16 08/08/2014 0835   ALT 15 08/08/2014 0835   ALKPHOS 52 08/08/2014 0835   BILITOT 0.4 08/08/2014 0835   PROT 7.2 08/08/2014 0835   PROT 7.4 10/28/2011 1050   ALBUMIN 4.0 08/08/2014 0835   ALBUMIN 4.2 10/28/2011 1050    Lipid Panel:     Component Value Date/Time   CHOL 155 08/08/2014 0835   CHOL 283* 10/28/2011 1050   TRIG 88.0 08/08/2014 0835   HDL 30.60* 08/08/2014 0835   HDL 34* 10/28/2011 1050   CHOLHDL 5 08/08/2014 0835   CHOLHDL 8.3* 10/28/2011 1050   VLDL 17.6 08/08/2014 0835   LDLCALC 107* 08/08/2014 0835   LDLCALC 216* 10/28/2011 1050    Assessment:  Patient with hyperlipidemia not compliant with rosuvastatin.  Last lipid panel was 08/08/14 with TC 155, TG 88, HDL 31, LDL 107.  10-Year ASCVD Calculated Risk is 25.2, Lifetime risk is 69%.  Recommendation is at least a moderate intensity statin and high intensity statin is reasonable unless contraindicated.  Goal LDL: 100 Progress towards LDL goal: near goal Comments: patient states has not taken rosuvastatin for at least 2 months because he is trying to work on diet to help his cholesterol.  Also, states that it makes him generally feel unwell and upsets his stomach when he is on it.  States he was previously on atorvastatin and did not tolerate that either.  Plan:   -Medications: recommended he continue rosuvastatin for now.  Can consider switching to pravastatin or lovastatin in the future -Labs: check lipids in March 2017 -Other plans: none -Encourage lifestyle modifications to lower cardiovascular disease risk.  This includes eating a heart-healthy diet, regular aerobic exercise, maintenance of desirable body weight, and avoidance of tobacco products

## 2015-03-17 NOTE — Assessment & Plan Note (Signed)
BP Readings from Last 3 Encounters:  03/17/15 130/70  02/06/15 120/58  12/07/14 140/75       Component Value Date/Time   NA 138 12/07/2014 1855   NA 135 10/28/2011 1050   K 3.3* 12/07/2014 1855   CREATININE 1.20 12/07/2014 1855   CREATININE 1.06 10/24/2014 0907    Assessment:  Patient with HTN compliant with 2 class (diuretic and BB) anti-hypertensive therapy who presents today with blood pressure of 130/70.  Denies headaches, chest pain, edema, dizziness or lightheadedness  Blood pressure control: controlled Progress towards BP goal: at goal Comments: patient states previously has been on an ACE inhibitor.  States he does not recall adverse reaction from this.  May benefit from ACE in the future especially given his propensity for hypokalemia.  Plan:   -Medication changes: none  -Labs: none today awaiting [employer] Card so patient does not have to pay out of pocket.  Plan to check BMP to monitor electrolytes -Encourage home BP monitoring 3 times per week or at drug store occasionally  -Encourage regular exercise and healthy diet (decreased salt intake)  Medications after today's visit: 1. Carvedilol 25mg  BID 2. Chlorthalidone 25mg  daily

## 2015-03-17 NOTE — Assessment & Plan Note (Signed)
Assessment: Per chart review, patient has HFpEF.  No records of previous Echo on EPIC.  Patient states was seen previously by Dr. Einar Gip but it has been at least 4-5 years.  Today, has no symptoms of HF, does not appear fluid overloaded on exam.  Lungs are clear.  Plan: -obtain records from Dr. Einar Gip -consider cardiology referral to evaluate status of heart failure once insurance issues figured out

## 2015-03-17 NOTE — Progress Notes (Signed)
Patient ID: MICHALL NOFFKE, male   DOB: [birth year], 59 y.o.   MRN: [id number]   Subjective:   Patient ID: SUNIL HUE male   DOB: 05/12/1956 59 y.o.   MRN: [id number]  HPI: Mr.Latwan E Minnie is a 59 y.o. male with past medical history of HFpEF, HTN, OSA (non-compliant with CPAP), GI bleed due to PUD from NSAIDs, DM type 2, HLD, gout, GERD, asthma who presents today as a new patient to [facility] to establish care.  He currently does not have insurance and plans to meet with Bonna Gains as well to discuss financial assistance and eligibility for the [employer].  He has no acute complaints.  See Problem List for Assessment and Plan of his multiple chronic medical conditions.   Past Medical History  Diagnosis Date  . CHF (congestive heart failure) ([place])   . Diabetes mellitus   . Hypertension   . Arthritis   . Asthma   . GERD (gastroesophageal reflux disease)   . Hyperlipidemia   . Sleep apnea   . Morbid obesity ([place])    Current Outpatient Prescriptions  Medication Sig Dispense Refill  . albuterol (PROVENTIL,VENTOLIN) 90 MCG/ACT inhaler Inhale 1 puff into the lungs every 6 (six) hours as needed for wheezing or shortness of breath. For chest tightness or wheezing     . canagliflozin (INVOKANA) 100 MG TABS tablet Take 1 tablet (100 mg total) by mouth daily. 90 tablet 1  . carvedilol (COREG) 25 MG tablet Take 1 tablet (25 mg total) by mouth 2 (two) times daily with a meal. 20 tablet 0  . chlorthalidone (HYGROTON) 25 MG tablet Take 1 tablet (25 mg total) by mouth daily. 90 tablet 1  . glimepiride (AMARYL) 4 MG tablet Take 1 tablet (4 mg total) by mouth 2 (two) times daily before a meal. 180 tablet 1  . Insulin Glargine (LANTUS) 100 UNIT/ML Solostar Pen Inject 10 Units into the skin daily at 10 pm. 15 mL 2  . KLOR-CON M20 20 MEQ tablet TAKE ONE TABLET BY MOUTH ONCE DAILY 30 tablet 0  . rosuvastatin (CRESTOR) 5 MG tablet Take 1 tablet (5 mg total) by mouth daily. 30 tablet 1  .  oxyCODONE-acetaminophen (PERCOCET/ROXICET) 5-325 MG per tablet Take 1-2 tablets by mouth every 4 (four) hours as needed for severe pain. (Patient not taking: Reported on 02/06/2015) 20 tablet 0  . pantoprazole (PROTONIX) 40 MG tablet Take 1 tablet (40 mg total) by mouth 2 (two) times daily before a meal. (Patient not taking: Reported on 02/06/2015) 60 tablet 1  . sucralfate (CARAFATE) 1 G tablet Take 1 tablet (1 g total) by mouth 4 (four) times daily. For 1 week (Patient not taking: Reported on 02/06/2015) 28 tablet 0   No current facility-administered medications for this visit.   Family History  Problem Relation Age of Onset  . Cancer Mother     ovarian  . Diabetes Mother   . Stroke Father   . Cancer Maternal Aunt     breast  . Diabetes Maternal Aunt   . Cancer Maternal Uncle     colon   Social History   Social History  . Marital Status: Single    Spouse Name: N/A  . Number of Children: N/A  . Years of Education: N/A   Social History Main Topics  . Smoking status: Never Smoker   . Smokeless tobacco: None  . Alcohol Use: No  . Drug Use: No  . Sexual Activity: Not Asked  Other Topics Concern  . None   Social History Narrative   Review of Systems: Review of Systems  Constitutional: Negative for fever and chills.  Eyes: Positive for blurred vision.  Respiratory: Negative for cough and shortness of breath.   Cardiovascular: Negative for chest pain and leg swelling.  Gastrointestinal: Negative for nausea, vomiting, diarrhea and constipation.  Genitourinary: Negative for dysuria.  Musculoskeletal: Negative for back pain.  Skin: Negative for rash.  Neurological: Negative for dizziness and headaches.  Psychiatric/Behavioral: Negative for depression.    Objective:  Physical Exam: Filed Vitals:   03/17/15 1452  BP: 130/70  Pulse: 72  Temp: 98.3 F (36.8 C)  TempSrc: Oral  Height: 5\' 6"  (1.676 m)  Weight: 259 lb (117.482 kg)  SpO2: 97%   Physical Exam    Constitutional: He is oriented to person, place, and time. He appears well-developed and well-nourished.  HENT:  Head: Normocephalic and atraumatic.  Eyes: EOM are normal.  Neck: Normal range of motion.  Cardiovascular: Normal rate and regular rhythm.   Pulmonary/Chest: Effort normal and breath sounds normal.  Abdominal: Soft. Bowel sounds are normal.  Musculoskeletal: Normal range of motion.  Neurological: He is alert and oriented to person, place, and time.  Skin: Skin is warm and dry.  Psychiatric: He has a normal mood and affect.    Assessment & Plan:  Please see Problem List for Assessment and Plan.

## 2015-03-17 NOTE — Assessment & Plan Note (Signed)
Assessment: Patient follows with Dr. Renne Crigler (endocrinology).  Current regimen includes Invokana, Amaryl, and Lantus.  Patient states he checks his blood sugars about 3 times daily.  AM sugars run between 90-110, afternoon 150-170, and PM 110-160.  Plan: -A1c today is 7.2, deteriorated from 6.1 five months ago -follow up with Dr. Renne Crigler

## 2015-03-17 NOTE — Assessment & Plan Note (Addendum)
Health Maintenance Due  Topic Date Due  . Hepatitis C Screening  28-Apr-1956  . PNEUMOCOCCAL POLYSACCHARIDE VACCINE (1) 06/04/1957  . FOOT EXAM  06/04/1965  . OPHTHALMOLOGY EXAM  06/04/1965  . HIV Screening  06/04/1970  . TETANUS/TDAP  06/04/1974  . COLONOSCOPY  06/04/2005  . INFLUENZA VACCINE  12/19/2014

## 2015-03-17 NOTE — Patient Instructions (Signed)
Thank you for bringing your medicines today. This helps [place] keep you safe from mistakes.  Please follow up with Dr. Renne Crigler for your diabetes management.  Please plan to follow up in our clinic in about 1 month to continue your establishment of care once you have met with Bonna Gains, our financial advisor, to see if you qualify for the [facility].  At that time, we will plan to get some basic lab work for you and arrange for screening examinations and/or referrals that you may need.  It was nice to meet you today.

## 2015-03-20 NOTE — Progress Notes (Signed)
Medicine attending: I personally interviewed and briefly examined this patient, and reviewed pertinent clinical laboratory  data  with resident physician Derrel Nip on the day of the patient visit and we discussed a  management plan.

## 2015-03-27 ENCOUNTER — Other Ambulatory Visit: Payer: Self-pay | Admitting: Internal Medicine

## 2015-03-29 ENCOUNTER — Ambulatory Visit: Payer: Self-pay

## 2015-04-25 ENCOUNTER — Other Ambulatory Visit: Payer: Self-pay | Admitting: Internal Medicine

## 2015-04-25 ENCOUNTER — Ambulatory Visit: Payer: Self-pay

## 2015-04-26 ENCOUNTER — Telehealth: Payer: Self-pay | Admitting: Internal Medicine

## 2015-04-26 NOTE — Telephone Encounter (Signed)
If he cannot get invokana w/o insurance, maybe he needs to add back Lantus (we have been trying to stop it at last visit...). Can he come at our appt on 12/19 so we can discuss further?

## 2015-04-26 NOTE — Telephone Encounter (Signed)
Patient called stating that he lost his job and insurance  He needs a refill on his invokana and does not know what to do  Please advise   Thank you

## 2015-04-26 NOTE — Telephone Encounter (Signed)
Please read message below and advise.  

## 2015-04-27 ENCOUNTER — Telehealth: Payer: Self-pay | Admitting: Internal Medicine

## 2015-04-27 NOTE — Telephone Encounter (Signed)
Returned pt's call and advised him per Dr Arman Filter message. Pt voiced understanding.

## 2015-04-27 NOTE — Telephone Encounter (Signed)
Spoke with patient. Advised him per Dr Arman Filter message. Pt voiced understanding. Pt is scheduled for the 19th. Be advised.

## 2015-04-27 NOTE — Telephone Encounter (Signed)
Call back pt regarding invokana please

## 2015-05-03 ENCOUNTER — Ambulatory Visit: Payer: Self-pay

## 2015-05-08 ENCOUNTER — Ambulatory Visit: Payer: Self-pay | Admitting: Internal Medicine

## 2015-06-27 ENCOUNTER — Other Ambulatory Visit: Payer: Self-pay | Admitting: Internal Medicine

## 2015-06-27 NOTE — Telephone Encounter (Signed)
Pt lost his job and does not have ins at this time, that is why he has not been in. Please advise if ok to refill his Klor-Con.

## 2015-06-28 ENCOUNTER — Encounter: Payer: Self-pay | Admitting: Internal Medicine

## 2015-06-28 ENCOUNTER — Ambulatory Visit (INDEPENDENT_AMBULATORY_CARE_PROVIDER_SITE_OTHER): Payer: Self-pay | Admitting: Internal Medicine

## 2015-06-28 VITALS — BP 131/55 | HR 79 | Temp 97.7°F | Ht 66.0 in | Wt 258.2 lb

## 2015-06-28 DIAGNOSIS — K219 Gastro-esophageal reflux disease without esophagitis: Secondary | ICD-10-CM

## 2015-06-28 DIAGNOSIS — J310 Chronic rhinitis: Secondary | ICD-10-CM | POA: Insufficient documentation

## 2015-06-28 DIAGNOSIS — Z7984 Long term (current) use of oral hypoglycemic drugs: Secondary | ICD-10-CM

## 2015-06-28 DIAGNOSIS — E1165 Type 2 diabetes mellitus with hyperglycemia: Secondary | ICD-10-CM

## 2015-06-28 DIAGNOSIS — IMO0001 Reserved for inherently not codable concepts without codable children: Secondary | ICD-10-CM

## 2015-06-28 LAB — GLUCOSE, CAPILLARY: Glucose-Capillary: 305 mg/dL — ABNORMAL HIGH (ref 65–99)

## 2015-06-28 MED ORDER — FLUTICASONE FUROATE 100 MCG/ACT IN AEPB: 2.0000 | INHALATION_SPRAY | Freq: Every day | RESPIRATORY_TRACT | Status: DC

## 2015-06-28 MED ORDER — POTASSIUM CHLORIDE CRYS ER 20 MEQ PO TBCR: 20.0000 meq | EXTENDED_RELEASE_TABLET | Freq: Every day | ORAL | Status: DC

## 2015-06-28 MED ORDER — OMEPRAZOLE 20 MG PO TBEC: 20.0000 mg | DELAYED_RELEASE_TABLET | Freq: Every day | ORAL | Status: DC

## 2015-06-28 MED ORDER — CANAGLIFLOZIN 100 MG PO TABS: 100.0000 mg | ORAL_TABLET | Freq: Every day | ORAL | Status: DC

## 2015-06-28 NOTE — Progress Notes (Signed)
Patient ID: Thomas Gardner, male   DOB: 1955/09/09, 60 y.o.   MRN: [id number]   Subjective:   Patient ID: Thomas Gardner male   DOB: 1955/07/18 60 y.o.   MRN: [id number]  HPI: Thomas Gardner is a 60 y.o. presented today with c/o cough- 60 weeks, not getting worse, some day= he does not cough at all. Cough is non productive during the day but is productive in the morning of whitish sputum. He feels stuff/secretions going down the back of his throat in teh mornings, he also feels congestion around his nose when he wakes up everyday. In the morning he says his voice sounds raspy, which clears up later in the day. No fever, no SOB.  No wheezing with cough.  He has not used his inhaler in a year, even with exercise.   Past Medical History  Diagnosis Date  . CHF (congestive heart failure) ([place])   . Diabetes mellitus   . Hypertension   . Arthritis   . Asthma   . GERD (gastroesophageal reflux disease)   . Hyperlipidemia   . Sleep apnea   . Morbid obesity ([place])    Current Outpatient Prescriptions  Medication Sig Dispense Refill  . albuterol (PROVENTIL,VENTOLIN) 90 MCG/ACT inhaler Inhale 1 puff into the lungs every 6 (six) hours as needed for wheezing or shortness of breath. For chest tightness or wheezing     . canagliflozin (INVOKANA) 100 MG TABS tablet Take 1 tablet (100 mg total) by mouth daily. 90 tablet 1  . carvedilol (COREG) 25 MG tablet Take 1 tablet (25 mg total) by mouth 2 (two) times daily with a meal. 20 tablet 0  . chlorthalidone (HYGROTON) 25 MG tablet Take 1 tablet (25 mg total) by mouth daily. 90 tablet 1  . glimepiride (AMARYL) 4 MG tablet Take 1 tablet (4 mg total) by mouth 2 (two) times daily before a meal. 180 tablet 1  . Insulin Glargine (LANTUS) 100 UNIT/ML Solostar Pen Inject 10 Units into the skin daily at 10 pm. 15 mL 2  . KLOR-CON M20 20 MEQ tablet TAKE ONE TABLET BY MOUTH ONCE DAILY 30 tablet 1  . oxyCODONE-acetaminophen (PERCOCET/ROXICET) 5-325 MG per tablet Take  1-2 tablets by mouth every 4 (four) hours as needed for severe pain. (Patient not taking: Reported on 02/06/2015) 20 tablet 0  . pantoprazole (PROTONIX) 40 MG tablet Take 1 tablet (40 mg total) by mouth 2 (two) times daily before a meal. (Patient not taking: Reported on 02/06/2015) 60 tablet 1  . rosuvastatin (CRESTOR) 5 MG tablet Take 1 tablet (5 mg total) by mouth daily. 30 tablet 1  . sucralfate (CARAFATE) 1 G tablet Take 1 tablet (1 g total) by mouth 4 (four) times daily. For 1 week (Patient not taking: Reported on 02/06/2015) 28 tablet 0   No current facility-administered medications for this visit.   Family History  Problem Relation Age of Onset  . Cancer Mother     ovarian  . Diabetes Mother   . Stroke Father   . Cancer Maternal Aunt     breast  . Diabetes Maternal Aunt   . Cancer Maternal Uncle     colon   Social History   Social History  . Marital Status: Single    Spouse Name: N/A  . Number of Children: N/A  . Years of Education: N/A   Social History Main Topics  . Smoking status: Never Smoker   . Smokeless tobacco: None  . Alcohol Use: No  .  Drug Use: No  . Sexual Activity: Not Asked   Other Topics Concern  . None   Social History Narrative   Review of Systems: CONSTITUTIONAL- No Fever,  or change in appetite. SKIN- No Rash, colour changes or itching. HEAD- No Headache or dizziness. EARS- No vertigo, hearing loss or ear discharge. Mouth/throat- No Sorethroat, dentures, or bleeding gums. CARDIAC- No Palpitations,or chest pain. GI- No, vomiting, diarrhoea, , abd pain. URINARY- No Frequency, urgency, straining or dysuria. NEUROLOGIC- No Numbness,  or burning. [facility]- Denies depression or anxiety.  Objective:  Physical Exam: Filed Vitals:   06/28/15 1002  BP: 131/55  Pulse: 79  Temp: 97.7 F (36.5 C)  TempSrc: Oral  Height: 5\' 6"  (1.676 m)  Weight: 258 lb 3.2 oz (117.119 kg)  SpO2: 100%   GENERAL- alert, co-operative, appears as stated age, not in  any distress. HEENT- Atraumatic, normocephalic, PERRL, EOMI, oral mucosa appears moist, good and intact dentition. No carotid bruit, no cervical LN enlargement, thyroid does not appear enlarged, neck supple. CARDIAC- RRR, no murmurs, rubs or gallops. RESP- Moving equal volumes of air, and clear to auscultation bilaterally, no wheezes or crackles. ABDOMEN- Soft, nontender, no guarding or rebound, no palpable masses or organomegaly, bowel sounds present. BACK- Normal curvature of the spine, No tenderness along the vertebrae, no CVA tenderness. NEURO- No obvious Cr N abnormality, strenght upper and lower extremities- 5/5, Sensation intact- globally, DTRs- Normal, finger to nose test normal bilat, rapid alternating movement- intact, Gait- Normal. EXTREMITIES- pulse 2+, symmetric, no pedal edema. SKIN- Warm, dry, No rash or lesion. PSYCH- Normal mood and affect, appropriate thought content and speech.  Assessment & Plan:  The patient's case and plan of care was discussed with attending physician, Dr. Lalla Brothers  Please see problem based charting for assessment and plan.

## 2015-06-28 NOTE — Assessment & Plan Note (Signed)
Pt follows with Dr Cruzita Lederer, is on invokana and glimeperide. Blood sugars elevated in the 200s this am, he has not taken his medication today. Requesting refill.   Plan- will refill invokana - Last HgbA1c- 7.2 - follow up with our financial counsellor and Education officer, [employer] for discount card/affording medications,.

## 2015-06-28 NOTE — Patient Instructions (Signed)
I have prescribed a nasal spray for you.   Use it once a day- 2 sprays once a day.  You most likely have Postnasal drip from a non allergic rhinitis. The spray into your nose should help. Once you start feeling better, cut down to one spray once a day.  Also I have called in a prescription for nexium or omeprazole- take one tablet once a day.  It was nice seeing you today.

## 2015-06-28 NOTE — Assessment & Plan Note (Signed)
Pts hx consistent with Chronic Non allergic Rhinitis post nasal drip, nasal congestion, and cough worse in the mornings. He has a hx of asthma, but has not used his inhaler in 1 year. He has no fever, wheezing or SOB. He says he used to have reflux symptoms but he does not feel it anymore.  Plan- Fluticasone- 2 sprays daily when symptoms have improved, then 1 spray daily for maintenace. - Trial of Omeprazole- 20mg  daily for 4 weeks only.

## 2015-06-29 NOTE — Progress Notes (Signed)
Internal Medicine Clinic Attending  Case discussed with Dr. Emokpae soon after the resident saw the patient.  We reviewed the resident's history and exam and pertinent patient test results.  I agree with the assessment, diagnosis, and plan of care documented in the resident's note. 

## 2015-08-01 ENCOUNTER — Ambulatory Visit: Payer: Self-pay

## 2015-08-04 ENCOUNTER — Telehealth: Payer: Self-pay | Admitting: Internal Medicine

## 2015-08-04 NOTE — Telephone Encounter (Signed)
APPT. REMINDER CALL, LMTCB °

## 2015-08-07 ENCOUNTER — Ambulatory Visit: Payer: Self-pay

## 2015-08-17 ENCOUNTER — Ambulatory Visit: Payer: Self-pay

## 2015-08-24 ENCOUNTER — Other Ambulatory Visit: Payer: Self-pay | Admitting: Internal Medicine

## 2015-08-25 ENCOUNTER — Ambulatory Visit: Payer: Self-pay

## 2015-08-28 ENCOUNTER — Other Ambulatory Visit: Payer: Self-pay | Admitting: Internal Medicine

## 2015-08-31 ENCOUNTER — Other Ambulatory Visit: Payer: Self-pay | Admitting: Internal Medicine

## 2015-09-28 ENCOUNTER — Encounter: Payer: Self-pay | Admitting: Internal Medicine

## 2015-10-24 ENCOUNTER — Other Ambulatory Visit: Payer: Self-pay | Admitting: Internal Medicine

## 2015-10-24 NOTE — Telephone Encounter (Signed)
Pt requesting refill

## 2015-10-27 ENCOUNTER — Other Ambulatory Visit: Payer: Self-pay | Admitting: Internal Medicine

## 2015-10-30 ENCOUNTER — Other Ambulatory Visit: Payer: Self-pay | Admitting: Internal Medicine

## 2015-10-30 DIAGNOSIS — K219 Gastro-esophageal reflux disease without esophagitis: Secondary | ICD-10-CM

## 2015-10-30 DIAGNOSIS — J310 Chronic rhinitis: Secondary | ICD-10-CM

## 2015-10-30 MED ORDER — OMEPRAZOLE 20 MG PO TBEC: 20.0000 mg | DELAYED_RELEASE_TABLET | Freq: Every day | ORAL | Status: DC

## 2015-10-30 MED ORDER — CANAGLIFLOZIN 100 MG PO TABS: 100.0000 mg | ORAL_TABLET | Freq: Every day | ORAL | Status: DC

## 2015-10-30 MED ORDER — POTASSIUM CHLORIDE CRYS ER 20 MEQ PO TBCR: 20.0000 meq | EXTENDED_RELEASE_TABLET | Freq: Every day | ORAL | Status: DC

## 2015-10-30 MED ORDER — GLIMEPIRIDE 4 MG PO TABS: 4.0000 mg | ORAL_TABLET | Freq: Two times a day (BID) | ORAL | Status: DC

## 2015-10-30 MED ORDER — FLUTICASONE FUROATE 100 MCG/ACT IN AEPB: 2.0000 | INHALATION_SPRAY | Freq: Every day | RESPIRATORY_TRACT | Status: DC

## 2015-10-30 NOTE — Telephone Encounter (Signed)
Pt states he needs all meds refilled [facility] [place]

## 2015-11-02 ENCOUNTER — Ambulatory Visit (INDEPENDENT_AMBULATORY_CARE_PROVIDER_SITE_OTHER): Payer: Self-pay | Admitting: Internal Medicine

## 2015-11-02 ENCOUNTER — Encounter: Payer: Self-pay | Admitting: Internal Medicine

## 2015-11-02 VITALS — BP 128/68 | HR 72 | Temp 97.7°F | Ht 65.0 in | Wt 256.3 lb

## 2015-11-02 DIAGNOSIS — Z794 Long term (current) use of insulin: Secondary | ICD-10-CM

## 2015-11-02 DIAGNOSIS — IMO0001 Reserved for inherently not codable concepts without codable children: Secondary | ICD-10-CM

## 2015-11-02 DIAGNOSIS — M109 Gout, unspecified: Secondary | ICD-10-CM

## 2015-11-02 DIAGNOSIS — E876 Hypokalemia: Secondary | ICD-10-CM

## 2015-11-02 DIAGNOSIS — M79671 Pain in right foot: Secondary | ICD-10-CM

## 2015-11-02 DIAGNOSIS — E1165 Type 2 diabetes mellitus with hyperglycemia: Secondary | ICD-10-CM

## 2015-11-02 DIAGNOSIS — Z79899 Other long term (current) drug therapy: Secondary | ICD-10-CM

## 2015-11-02 LAB — GLUCOSE, CAPILLARY: Glucose-Capillary: 244 mg/dL — ABNORMAL HIGH (ref 65–99)

## 2015-11-02 MED ORDER — INSULIN GLARGINE 100 UNIT/ML SOLOSTAR PEN: 10.0000 [IU] | PEN_INJECTOR | Freq: Every day | SUBCUTANEOUS | Status: DC

## 2015-11-02 MED ORDER — INSULIN PEN NEEDLE 31G X 5 MM MISC: Status: DC

## 2015-11-02 NOTE — Progress Notes (Signed)
   Subjective:    Patient ID: Thomas Gardner, male    DOB: Feb 17, 1956, 60 y.o.   MRN: [id number]  HPI  60 yo male with CHF, HTN, DM II, gout, GERD, present for b/l leg and foot pain/cramps.  Has cramps on both legs, around calves, for 1 month. Takes K supplement, wonders if his K + is low. Also having lateral mid foot pain on right foot, started 1 week ago throbbing pain, 3/10 now, was 9/10 when it first happened. No injuries. Got better slowly now. No numbness or tingling.    DM II - last a1c 10.5 at outside facility on 10/24/15. On invokana 100mg  daily + glimepiride 4 mg tablet BID. Not on lantus or any other insulin at this time. Home sugars checked 1-2 daily, runs in 200's. Used to follow with endocrinlogy but can't anymore as has no insurance now. Was on lantus in the past but was taken off due to good DM II control.  Having frequent urination and thirst.      Review of Systems  Constitutional: Negative for fever and chills.  Eyes: Negative for visual disturbance.  Respiratory: Negative for cough, chest tightness and wheezing.   Cardiovascular: Negative for chest pain, palpitations and leg swelling.  Endocrine: Positive for polydipsia and polyuria.  Genitourinary: Negative for dysuria and flank pain.  Musculoskeletal:       Leg cramps.  Right lateral mid foot pain.   Neurological: Negative for dizziness and numbness.       Objective:   Physical Exam  Constitutional: He is oriented to person, place, and time. He appears well-developed and well-nourished. No distress.  HENT:  Head: Normocephalic and atraumatic.  Eyes: Conjunctivae are normal. Right eye exhibits no discharge. Left eye exhibits no discharge. No scleral icterus.  Neck: Normal range of motion.  Cardiovascular: Normal rate, regular rhythm, S1 normal, S2 normal and normal heart sounds.  Exam reveals no gallop and no friction rub.   No murmur heard. Pulmonary/Chest: Effort normal and breath sounds normal. No  respiratory distress. He has no wheezes. He has no rales. He exhibits no tenderness.  Abdominal: Soft. Bowel sounds are normal. He exhibits no distension. There is no tenderness.  Musculoskeletal: Normal range of motion. He exhibits no edema or tenderness.  Right lateral mid foot has tenderness to palpation on the dorsal surface. No erythema, edema, or discharge.   Neurological: He is alert and oriented to person, place, and time. He has normal strength and normal reflexes. No cranial nerve deficit or sensory deficit.  Skin: He is not diaphoretic.  Psychiatric: He has a normal mood and affect.     Filed Vitals:   11/02/15 1035 11/02/15 1036  BP:  128/68  Pulse:  72  Temp: 97.7 F (36.5 C)         Assessment & Plan:  See problem based a&p

## 2015-11-02 NOTE — Patient Instructions (Signed)
Take lantus 10 units daily. Check sugar 3 times a day.  Bring meter with you next visit.  Take rest of your other meds  Will check blood work today.  F/up in 2 weeks.

## 2015-11-02 NOTE — Progress Notes (Unsigned)
Patient ID: AMEYA SEARCH, male   DOB: [birth year], 60 y.o.   MRN: [id number] Medication Samples have been provided to the patient.  Drug name: Lantus Solostar       Strength: 100u/ml        Qty: 2 pens  LOT: [id number]  Exp.Date: 12/2017  Dosing instructions: Inject 10 units under the skin once a day at 10 pm.  The patient has been instructed regarding the correct time, dose, and frequency of taking this medication, including desired effects and most common side effects.   Juanell Fairly 5:47 PM 11/02/2015

## 2015-11-02 NOTE — Assessment & Plan Note (Signed)
Has hx of hypokalemia, last K was 3.3 during ED visit. Takes 20 meq K daily at home. His leg cramps could be from low K+. - will check BMET.

## 2015-11-02 NOTE — Assessment & Plan Note (Addendum)
His DM II was previously better controlled (last a1c 7.2), was taken off lantus previous since his DM II was well controlled. Currently on On invokana 100mg  daily + glimepiride 4 mg tablet BID.   Checked a1c at outside facility, was 10.5 on 10/24/15. Having polydipsia and polyuria. Didn't bring meter but home sugars running 200-300's.  - will restart lantus 10 units daily. Gave free sample and asked the pharmacy team to help with med assist as he does not have insurance.  - cont invokana 100mg  + glimepiride 4mg  bid for now - f/up in 2 weeks. Asked to check sugar 3 times a day - will check BMET today to make sure he is not getting acidotic or having other electrolyte problems.

## 2015-11-02 NOTE — Assessment & Plan Note (Signed)
His right lateral mid foot pain 1 week ago which was 9/10 now better to 3/10 with hx of gout sounds like it could have been a gout flare. It's already has been 1 week so would make much difference to give him colchicine now.  - asked to call us for next flare up so we can treat it right away.

## 2015-11-03 LAB — BASIC METABOLIC PANEL
BUN/Creatinine Ratio: 17 (ref 10–24)
BUN: 19 mg/dL (ref 8–27)
CHLORIDE: 99 mmol/L (ref 96–106)
CO2: 22 mmol/L (ref 18–29)
CREATININE: 1.11 mg/dL (ref 0.76–1.27)
Calcium: 9.6 mg/dL (ref 8.6–10.2)
GFR calc non Af Amer: 72 mL/min/{1.73_m2} (ref >59)
GFR, EST AFRICAN AMERICAN: 83 mL/min/{1.73_m2} (ref >59)
GLUCOSE: 254 mg/dL — AB (ref 65–99)
Potassium: 3.7 mmol/L (ref 3.5–5.2)
Sodium: 140 mmol/L (ref 134–144)

## 2015-11-06 NOTE — Progress Notes (Signed)
Internal Medicine Clinic Attending  Case discussed with Dr. Ahmed at the time of the visit.  We reviewed the resident's history and exam and pertinent patient test results.  I agree with the assessment, diagnosis, and plan of care documented in the resident's note. 

## 2015-11-07 NOTE — Progress Notes (Signed)
Patient was seen in clinic with Juanell Fairly, PharmD candidate. I agree with the assessment and plan of care documented.

## 2015-11-13 NOTE — Addendum Note (Signed)
Addended by: Truddie Crumble on: 11/13/2015 10:43 AM   Modules accepted: Orders

## 2015-11-16 ENCOUNTER — Telehealth: Payer: Self-pay

## 2015-11-16 NOTE — Telephone Encounter (Signed)
Dri from [facility] requesting the nurse to call back regarding omeprazole.

## 2015-11-16 NOTE — Telephone Encounter (Signed)
Called pharmacy and clarified that Prilosec was no longer on patient's active med list.

## 2015-11-22 ENCOUNTER — Telehealth: Payer: Self-pay | Admitting: *Deleted

## 2015-11-22 NOTE — Telephone Encounter (Signed)
Patient has been taking capsules, pharmacy wants to know if it would be okay to dispense capsules instead of tablets. Thanks!

## 2015-11-27 ENCOUNTER — Emergency Department (HOSPITAL_BASED_OUTPATIENT_CLINIC_OR_DEPARTMENT_OTHER): Payer: Self-pay

## 2015-11-27 ENCOUNTER — Encounter (HOSPITAL_BASED_OUTPATIENT_CLINIC_OR_DEPARTMENT_OTHER): Payer: Self-pay

## 2015-11-27 ENCOUNTER — Emergency Department (HOSPITAL_BASED_OUTPATIENT_CLINIC_OR_DEPARTMENT_OTHER)
Admission: EM | Admit: 2015-11-27 | Discharge: 2015-11-27 | Disposition: A | Discharge status: Home / Self Care | Payer: Self-pay | Attending: Emergency Medicine | Admitting: Emergency Medicine

## 2015-11-27 DIAGNOSIS — E119 Type 2 diabetes mellitus without complications: Secondary | ICD-10-CM | POA: Insufficient documentation

## 2015-11-27 DIAGNOSIS — I509 Heart failure, unspecified: Secondary | ICD-10-CM | POA: Insufficient documentation

## 2015-11-27 DIAGNOSIS — Z794 Long term (current) use of insulin: Secondary | ICD-10-CM | POA: Insufficient documentation

## 2015-11-27 DIAGNOSIS — Z6841 Body Mass Index (BMI) 40.0 and over, adult: Secondary | ICD-10-CM | POA: Insufficient documentation

## 2015-11-27 DIAGNOSIS — R1031 Right lower quadrant pain: Secondary | ICD-10-CM | POA: Insufficient documentation

## 2015-11-27 DIAGNOSIS — J45909 Unspecified asthma, uncomplicated: Secondary | ICD-10-CM | POA: Insufficient documentation

## 2015-11-27 DIAGNOSIS — N50812 Left testicular pain: Secondary | ICD-10-CM | POA: Insufficient documentation

## 2015-11-27 DIAGNOSIS — R109 Unspecified abdominal pain: Secondary | ICD-10-CM

## 2015-11-27 DIAGNOSIS — I110 Hypertensive heart disease with heart failure: Secondary | ICD-10-CM | POA: Insufficient documentation

## 2015-11-27 DIAGNOSIS — N50819 Testicular pain, unspecified: Secondary | ICD-10-CM

## 2015-11-27 DIAGNOSIS — Z79899 Other long term (current) drug therapy: Secondary | ICD-10-CM | POA: Insufficient documentation

## 2015-11-27 LAB — CBC
HEMATOCRIT: 45.4 % (ref 39.0–52.0)
Hemoglobin: 15.4 g/dL (ref 13.0–17.0)
MCH: 26.5 pg (ref 26.0–34.0)
MCHC: 33.9 g/dL (ref 30.0–36.0)
MCV: 78.1 fL (ref 78.0–100.0)
PLATELETS: 182 10*3/uL (ref 150–400)
RBC: 5.81 MIL/uL (ref 4.22–5.81)
RDW: 15.1 % (ref 11.5–15.5)
WBC: 15 10*3/uL — AB (ref 4.0–10.5)

## 2015-11-27 LAB — URINE MICROSCOPIC-ADD ON

## 2015-11-27 LAB — BASIC METABOLIC PANEL
ANION GAP: 10 (ref 5–15)
BUN: 21 mg/dL — ABNORMAL HIGH (ref 6–20)
CALCIUM: 9.5 mg/dL (ref 8.9–10.3)
CO2: 26 mmol/L (ref 22–32)
CREATININE: 1.21 mg/dL (ref 0.61–1.24)
Chloride: 96 mmol/L — ABNORMAL LOW (ref 101–111)
GLUCOSE: 265 mg/dL — AB (ref 65–99)
Potassium: 3.7 mmol/L (ref 3.5–5.1)
Sodium: 132 mmol/L — ABNORMAL LOW (ref 135–145)

## 2015-11-27 LAB — URINALYSIS, ROUTINE W REFLEX MICROSCOPIC
Bilirubin Urine: NEGATIVE
Glucose, UA: >1000 mg/dL — AB
Ketones, ur: NEGATIVE mg/dL
LEUKOCYTES UA: NEGATIVE
Nitrite: NEGATIVE
Protein, ur: NEGATIVE mg/dL
SPECIFIC GRAVITY, URINE: 1.03 (ref 1.005–1.030)
pH: 5.5 (ref 5.0–8.0)

## 2015-11-27 MED ORDER — METHOCARBAMOL 500 MG PO TABS
1000.0000 mg | ORAL_TABLET | Freq: Once | ORAL | Status: AC
  Administered 2015-11-27: 1000 mg via ORAL
  Filled 2015-11-27: qty 2

## 2015-11-27 MED ORDER — CEFTRIAXONE SODIUM 250 MG IJ SOLR
250.0000 mg | Freq: Once | INTRAMUSCULAR | Status: AC
Start: 2015-11-27 — End: 2015-11-27
  Administered 2015-11-27: 250 mg via INTRAMUSCULAR
  Filled 2015-11-27: qty 250

## 2015-11-27 MED ORDER — METHOCARBAMOL 500 MG PO TABS: 500.0000 mg | ORAL_TABLET | Freq: Two times a day (BID) | ORAL | Status: DC

## 2015-11-27 MED ORDER — AZITHROMYCIN 250 MG PO TABS
1000.0000 mg | ORAL_TABLET | Freq: Once | ORAL | Status: AC
Start: 2015-11-27 — End: 2015-11-27
  Administered 2015-11-27: 1000 mg via ORAL
  Filled 2015-11-27: qty 4

## 2015-11-27 MED ORDER — NAPROXEN 500 MG PO TABS: 500.0000 mg | ORAL_TABLET | Freq: Two times a day (BID) | ORAL | Status: DC

## 2015-11-27 MED ORDER — NAPROXEN 250 MG PO TABS
500.0000 mg | ORAL_TABLET | Freq: Once | ORAL | Status: AC
  Administered 2015-11-27: 500 mg via ORAL
  Filled 2015-11-27: qty 2

## 2015-11-27 NOTE — ED Provider Notes (Signed)
CSN: [id number]     Arrival date & time 11/27/15  1606 History  By signing my name below, I, Thomas Gardner, attest that this documentation has been prepared under the direction and in the presence of S. Wendie Simmer, PA-C Electronically Signed: [name], ED Scribe. 11/27/2015. 4:44 PM.    Chief Complaint  Patient presents with  . Flank Pain  . Groin Swelling      The history is provided by the patient. No language interpreter was used.    HPI Comments: Thomas Gardner is a 60 y.o. male with a medical hx of DM, HTN, hyperlipidemia, CHF, who presents to the Emergency Department complaining of 6/10, intermittent, right flank pain onset 3 days. Pt notes that his right flank pain radiates to his right sided abdomen. Pt reports that his right flank pain is worsened with movement and alleviated with rest. He states that he is having associated symptoms of mild abdominal pain, abdominal bloating, resolved vomiting, urinary frequency, and intermittent cramping to bilateral legs. Pt reports that he has had abdominal pain in the past. Pt has been using home herbal teas and prune juice recently to remain healthy and he is unsure if one of the teas has interfered with his current daily medications. He denies nausea, dysuria, hematuria, penile discharge, and any other symptoms. Denies being on lasix at this time.   Pt secondarily complains of mildly resolved left testicular pain/swelling onset 2 days ago. Pt hasn't tried any medications for the relief of his symptoms. Denies penile discharge and any other symptoms.   Pt thirdly complains of right foot pain onset 3 weeks. Pt notes that he was seen 3 weeks ago for his right foot pain and informed that he may have gout, although the pt denies his foot being red at the time. Pt hasn't tried any medications for his symptoms. Denies redness to right foot, gait problem, wound, rash, and any other symptoms.   Past Medical History  Diagnosis Date  . CHF  (congestive heart failure) ([place])   . Diabetes mellitus   . Hypertension   . Arthritis   . Asthma   . GERD (gastroesophageal reflux disease)   . Hyperlipidemia   . Sleep apnea   . Morbid obesity [facility])    Past Surgical History  Procedure Laterality Date  . Cardiac catheterization    . Breath tek h pylori  11/11/2011    Procedure: BREATH TEK H PYLORI;  Surgeon: Shann Medal, MD;  Location: [facility];  Service: General;  Laterality: N/A;  . Esophagogastroduodenoscopy (egd) with propofol N/A 05/04/2014    Procedure: ESOPHAGOGASTRODUODENOSCOPY (EGD) WITH PROPOFOL;  Surgeon: Cleotis Nipper, MD;  Location: [facility];  Service: Endoscopy;  Laterality: N/A;   Family History  Problem Relation Age of Onset  . Cancer Mother     ovarian  . Diabetes Mother   . Stroke Father   . Cancer Maternal Aunt     breast  . Diabetes Maternal Aunt   . Cancer Maternal Uncle     colon   Social History  Substance Use Topics  . Smoking status: Never Smoker   . Smokeless tobacco: None  . Alcohol Use: No    Review of Systems  A complete 10 system review of systems was obtained and all systems are negative except as noted in the HPI and PMH.   Allergies  Erythromycin  Home Medications   Prior to Admission medications   Medication Sig Start Date End Date Taking? Authorizing  Provider  albuterol (PROVENTIL,VENTOLIN) 90 MCG/ACT inhaler Inhale 1 puff into the lungs every 6 (six) hours as needed for wheezing or shortness of breath. For chest tightness or wheezing     Historical Provider, MD  canagliflozin (INVOKANA) 100 MG TABS tablet Take 1 tablet (100 mg total) by mouth daily. **PT NEEDS APPT FOR FURTHER REFILLS** 08/31/15   Philemon Kingdom, MD  canagliflozin (INVOKANA) 100 MG TABS tablet Take 1 tablet (100 mg total) by mouth daily. 10/30/15   Jule Ser, DO  carvedilol (COREG) 25 MG tablet Take 1 tablet (25 mg total) by mouth 2 (two) times daily with a meal. 12/07/14   Orlie Dakin, MD   chlorthalidone (HYGROTON) 25 MG tablet TAKE ONE TABLET BY MOUTH ONCE DAILY 10/30/15   Philemon Kingdom, MD  Fluticasone Furoate 100 MCG/ACT AEPB Inhale 2 sprays into the lungs daily. 10/30/15   Jule Ser, DO  glimepiride (AMARYL) 4 MG tablet Take 1 tablet (4 mg total) by mouth 2 (two) times daily before a meal. 10/30/15   Jule Ser, DO  Insulin Glargine (LANTUS) 100 UNIT/ML Solostar Pen Inject 10 Units into the skin daily at 10 pm. 11/02/15   Dellia Nims, MD  Insulin Pen Needle (B-D UF III MINI PEN NEEDLES) 31G X 5 MM MISC Use to inject lantus daily 11/02/15   Tasrif Ahmed, MD  potassium chloride SA (KLOR-CON M20) 20 MEQ tablet Take 1 tablet (20 mEq total) by mouth daily. 10/30/15   Jule Ser, DO   BP 124/62 mmHg  Pulse 87  Temp(Src) 98.8 F (37.1 C) (Oral)  Resp 18  Ht 5\' 6"  (1.676 m)  Wt 250 lb (113.399 kg)  BMI 40.37 kg/m2  SpO2 98% Physical Exam  Constitutional: He is oriented to person, place, and time. He appears well-developed and well-nourished. No distress.  HENT:  Head: Normocephalic and atraumatic.  Eyes: EOM are normal.  Neck: Neck supple.  Cardiovascular: Normal rate, regular rhythm and normal heart sounds.  Exam reveals no gallop and no friction rub.   No murmur heard. Pulmonary/Chest: Effort normal and breath sounds normal. No respiratory distress. He has no wheezes. He has no rales.  Abdominal: Soft. He exhibits no distension. There is no tenderness. There is no CVA tenderness.  No CVA tenderness  Genitourinary: Left testis shows swelling.  Scribe chaperone present for exam. Mild edema and erythema to left testicle.   Musculoskeletal: Normal range of motion.  Right foot is non tender. Nl ROM. No edema. NVI.   Neurological: He is alert and oriented to person, place, and time.  Skin: Skin is warm and dry.  Psychiatric: He has a normal mood and affect. His behavior is normal.  Nursing note and vitals reviewed.   ED Course  Procedures (including critical  care time) DIAGNOSTIC STUDIES: Oxygen Saturation is 98% on RA, nl by my interpretation.    COORDINATION OF CARE: 4:41 PM Discussed treatment plan with pt at bedside which includes UA, CT renal stone study, US scrotum, labs, RPR, HIV antibody, anti-inflammtory, muscle relaxer, rocephin, Zithromax, robaxin Rx, and naprosyn Rx, and pt agreed to plan.    Labs Review Labs Reviewed  URINALYSIS, ROUTINE W REFLEX MICROSCOPIC (NOT AT [facility]) - Abnormal; Notable for the following:    Glucose, UA >1000 (*)    Hgb urine dipstick TRACE (*)    All other components within normal limits  CBC - Abnormal; Notable for the following:    WBC 15.0 (*)    All other components within normal limits  BASIC METABOLIC PANEL - Abnormal; Notable for the following:    Sodium 132 (*)    Chloride 96 (*)    Glucose, Bld 265 (*)    BUN 21 (*)    All other components within normal limits  URINE MICROSCOPIC-ADD ON - Abnormal; Notable for the following:    Squamous Epithelial / LPF 0-5 (*)    Bacteria, UA MANY (*)    All other components within normal limits  RPR  HIV ANTIBODY (ROUTINE TESTING)  GC/CHLAMYDIA PROBE AMP ([place]) NOT AT [facility]    Imaging Review US Scrotum  11/27/2015  CLINICAL DATA:  Left scrotal pain for the past 3 days. EXAM: SCROTAL ULTRASOUND DOPPLER ULTRASOUND OF THE TESTICLES TECHNIQUE: Complete ultrasound examination of the testicles, epididymis, and other scrotal structures was performed. Color and spectral Doppler ultrasound were also utilized to evaluate blood flow to the testicles. COMPARISON:  None. FINDINGS: Right testicle Measurements: 4.1 x 3.1 x 2.8 cm. No mass or microlithiasis visualized. Left testicle Measurements: 4.4 x 3.1 x 1.9 cm. No mass or microlithiasis visualized. Right epididymis: 6 mm cyst in the head of the epididymis. Otherwise, normal appearing. Left epididymis: Several small cysts and small calcifications in the head of the epididymis. The largest cyst measures 2 mm.  Hydrocele:  Small bilateral hydroceles. Varicocele:  Small varicocele on the right. Pulsed Doppler interrogation of both testes demonstrates normal low resistance arterial and venous waveforms bilaterally. IMPRESSION: 1. No evidence of testicular torsion or epididymitis. 2. Small bilateral hydroceles. 3. 6 mm right epididymal cyst. 4. Several 2 mm and smaller left epididymal cysts. 5. Several small left epididymal calcifications, most likely due to previous infection. 6. Small right varicocele. Electronically Signed   By: Claudie Revering M.D.   On: 11/27/2015 18:12   [place] Art/ven Flow Abd Pelv Doppler  11/27/2015  CLINICAL DATA:  Left scrotal pain for the past 3 days. EXAM: SCROTAL ULTRASOUND DOPPLER ULTRASOUND OF THE TESTICLES TECHNIQUE: Complete ultrasound examination of the testicles, epididymis, and other scrotal structures was performed. Color and spectral Doppler ultrasound were also utilized to evaluate blood flow to the testicles. COMPARISON:  None. FINDINGS: Right testicle Measurements: 4.1 x 3.1 x 2.8 cm. No mass or microlithiasis visualized. Left testicle Measurements: 4.4 x 3.1 x 1.9 cm. No mass or microlithiasis visualized. Right epididymis: 6 mm cyst in the head of the epididymis. Otherwise, normal appearing. Left epididymis: Several small cysts and small calcifications in the head of the epididymis. The largest cyst measures 2 mm. Hydrocele:  Small bilateral hydroceles. Varicocele:  Small varicocele on the right. Pulsed Doppler interrogation of both testes demonstrates normal low resistance arterial and venous waveforms bilaterally. IMPRESSION: 1. No evidence of testicular torsion or epididymitis. 2. Small bilateral hydroceles. 3. 6 mm right epididymal cyst. 4. Several 2 mm and smaller left epididymal cysts. 5. Several small left epididymal calcifications, most likely due to previous infection. 6. Small right varicocele. Electronically Signed   By: Claudie Revering M.D.   On: 11/27/2015 18:12   Ct Renal  Stone Study  11/27/2015  CLINICAL DATA:  Right-sided flank pain and testicular swelling on the left EXAM: CT ABDOMEN AND PELVIS WITHOUT CONTRAST TECHNIQUE: Multidetector CT imaging of the abdomen and pelvis was performed following the standard protocol without IV contrast. COMPARISON:  05/03/2014 FINDINGS: Lower chest:  No acute findings. Hepatobiliary: No mass visualized on this un-enhanced exam. Pancreas: No mass or inflammatory process identified on this un-enhanced exam. Spleen: Within normal limits in size. Adrenals/Urinary Tract: Kidneys demonstrates  some renal vascular calcifications although no renal calculi or obstructive changes are noted. The ureters are within normal limits. The bladder is well distended. The adrenal glands are unremarkable. Stomach/Bowel: The appendix is within normal limits. No obstructive changes are seen. No inflammatory changes are noted. Vascular/Lymphatic: Mild aortoiliac calcifications are seen. No significant lymphadenopathy is noted. Reproductive: No mass or other significant abnormality. Other: None. Musculoskeletal:  No suspicious bone lesions identified. IMPRESSION: No acute abnormality noted. Electronically Signed   By: Inez Catalina M.D.   On: 11/27/2015 17:16   I have personally reviewed and evaluated these images and lab results as part of my medical decision-making.   MDM   Final diagnoses:  Flank pain  Testicle pain   Glucosuria on UA. Nonspecific leukocytosis of 15. Labs otherwise unremarkable.  CT of abdomen/pelvis and ultrasound unremarkable for acute change. Patient treated empirically for STI today.  Patient may be safely discharged home. Discussed reasons for return. Patient to follow-up with primary care provider within one week. Patient in understanding and agreement with the plan.  I personally performed the services described in this documentation, which was scribed in my presence. The recorded information has been reviewed and is  accurate.   Larimer Lions, PA-C 12/11/15 1331  Julianne Rice, MD 12/24/15 Joen Laura

## 2015-11-27 NOTE — ED Notes (Signed)
C/o right flank/groin pain-swelling to left testicle-s/s started Friday-also c/o cramps in legs-NAD-steady gait

## 2015-11-27 NOTE — Discharge Instructions (Signed)
Mr. DRAVON YOUNKIN,  Nice meeting you! Please follow-up with your primary care provider. Return to the emergency department if you develop fevers, chills, chest pain, shortness of breath, abdominal pain, nausea/vomiting, new/worsening symptoms. Feel better soon!  S. Wendie Simmer, PA-C

## 2015-11-28 LAB — HIV ANTIBODY (ROUTINE TESTING W REFLEX): HIV SCREEN 4TH GENERATION: NONREACTIVE

## 2015-11-28 LAB — GC/CHLAMYDIA PROBE AMP (~~LOC~~) NOT AT ARMC
Chlamydia: NEGATIVE
NEISSERIA GONORRHEA: NEGATIVE

## 2015-11-28 LAB — RPR: RPR: NONREACTIVE

## 2015-12-04 ENCOUNTER — Encounter: Payer: Self-pay | Admitting: Internal Medicine

## 2015-12-04 ENCOUNTER — Ambulatory Visit (INDEPENDENT_AMBULATORY_CARE_PROVIDER_SITE_OTHER): Payer: Self-pay | Admitting: Internal Medicine

## 2015-12-04 VITALS — BP 153/78 | HR 67 | Temp 98.1°F | Ht 66.0 in | Wt 255.5 lb

## 2015-12-04 DIAGNOSIS — Z0000 Encounter for general adult medical examination without abnormal findings: Secondary | ICD-10-CM

## 2015-12-04 DIAGNOSIS — Z79899 Other long term (current) drug therapy: Secondary | ICD-10-CM

## 2015-12-04 DIAGNOSIS — N5089 Other specified disorders of the male genital organs: Secondary | ICD-10-CM

## 2015-12-04 DIAGNOSIS — Z6841 Body Mass Index (BMI) 40.0 and over, adult: Secondary | ICD-10-CM

## 2015-12-04 DIAGNOSIS — IMO0001 Reserved for inherently not codable concepts without codable children: Secondary | ICD-10-CM

## 2015-12-04 DIAGNOSIS — E1165 Type 2 diabetes mellitus with hyperglycemia: Principal | ICD-10-CM

## 2015-12-04 DIAGNOSIS — I10 Essential (primary) hypertension: Secondary | ICD-10-CM

## 2015-12-04 DIAGNOSIS — Z794 Long term (current) use of insulin: Secondary | ICD-10-CM

## 2015-12-04 DIAGNOSIS — E119 Type 2 diabetes mellitus without complications: Secondary | ICD-10-CM

## 2015-12-04 MED ORDER — LISINOPRIL 5 MG PO TABS: 5.0000 mg | ORAL_TABLET | Freq: Every day | ORAL | Status: DC

## 2015-12-04 MED ORDER — CANAGLIFLOZIN 300 MG PO TABS: 300.0000 mg | ORAL_TABLET | Freq: Every day | ORAL | Status: DC

## 2015-12-04 MED ORDER — ROSUVASTATIN CALCIUM 5 MG PO TABS: 5.0000 mg | ORAL_TABLET | Freq: Every day | ORAL | Status: DC

## 2015-12-04 NOTE — Progress Notes (Signed)
Patient ID: Thomas Gardner, male   DOB: [birth year], 60 y.o.   MRN: [id number]   CC: ED follow up for scrotal pain, DM, HTN  HPI:  Thomas Gardner is a 60 y.o. man with PMHx as below presenting today for follow up of ED visit due to scrotal pain and swelling.  He is also following up for his DM and HTN.  Please see A&P for problem oriented charting.  Past Medical History  Diagnosis Date  . CHF (congestive heart failure) ([place])   . Diabetes mellitus   . Hypertension   . Arthritis   . Asthma   . GERD (gastroesophageal reflux disease)   . Hyperlipidemia   . Sleep apnea   . Morbid obesity ([place])     Review of Systems:   Review of Systems  Constitutional: Negative for fever and chills.  Gastrointestinal: Positive for abdominal pain and constipation. Negative for nausea, vomiting and diarrhea.  Genitourinary: Positive for dysuria (improved.). Negative for hematuria and flank pain.       Positive for Left scrotal swelling.     Physical Exam:  Filed Vitals:   12/04/15 1006  BP: 153/78  Pulse: 67  Temp: 98.1 F (36.7 C)  TempSrc: Oral  Height: 5\' 6"  (1.676 m)  Weight: 255 lb 8 oz (115.894 kg)  SpO2: 100%   Physical Exam  Constitutional: He is oriented to person, place, and time and well-developed, well-nourished, and in no distress.  HENT:  Head: Normocephalic and atraumatic.  Abdominal: Soft. Bowel sounds are normal. He exhibits no distension. There is tenderness (RLQ and LLQ). There is no rebound and no guarding.  Genitourinary: Penis normal. No discharge found.  His left scrotum is more swollen than right.  Very minimal tenderness.  No obvious masses or skin changes.  No warmth or erythema.  No [place] deformity.  Neurological: He is alert and oriented to person, place, and time.  Skin: Skin is warm and dry.  Psychiatric: Affect normal.     Assessment & Plan:   See encounters tab for problem based medical decision making.   Patient discussed with Dr. Angelia Mould   Scrotal swelling A: Patient seen today for ED follow up for abdominal pain that he believes is related to constipation and scrotal pain and swelling.  His abdominal pain has improved and he reports having large BM yesterday which significantly helped.  He had a negative CT scan of abdomen at ED visit on 11/27/15.  His scrotal pain has improved and now describes as discomfort when he walks, strains himself, or applies pressure to the area.  His left scrotum is more swollen than right on exam and only mildly tender.  In the ED, his UA was negative for nitrites or leukocytes and there was many bacteria on microscopy.  His GC/CT were negative.  His scrotal ultrasound was reassuring.  He was treated with CTX and azithromycin.  He currently denies any fevers, chills, discharge, flank pain.  His WBC was elevated to 15 in the ED which may have been due to stress reaction.  He had some initial burning with urination with the swelling but that is now better.   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  P:  - will conservatively manage as he reports improvement in his symptoms. - if this were an infectious cause of his pain and swelling such as epididymitis, then he  was appropriately treated in the ED. - I suspect this is a non-infectious cause and recommended he continue NSAIDs, rest, scrotal elevation, warm baths - if he is not better in 1-2 weeks, I have asked him to come back for re-evaluation and possible referral to urology.  If he is not better or worsens, he may benefit from a course of levofloxacin to cover enteric organisms. - I recommended he try to maintain normal BM's with daily stool softeners. - RTC as needed.  Diabetes mellitus type 2, uncontrolled, without complications Lab Results  Component Value Date   HGBA1C 7.2 03/17/2015   A: Patient reports that he has not been taking his Lantus 10units  daily due to side effect of constipation.  He reports compliance with other medications including glimepiride 4mg  BID and Invokana 100mg  daily.  P: - continue Lantus 10units QHS.  Recommended he try taking stool softener daily to maintain regular BM - continue Glimepiride 4mg  BID - I have increased his Invokana to 300mg  daily - at next visit, he needs foot exam and eye exam - I have restarted his Lisinopril for renal benefit - RTC 2 months for repeat A1c    Morbid obesity ([place]) A: Body mass index is 41.26 kg/(m^2).  P: - encouraged weight loss, diet and exercise  Healthcare maintenance A: - He is overdue for eye exam and foot exam. - He needs HCV screening - He needs colon cancer screening - He needs repeat lipid panel. - HIV was negative 11/27/15   Essential hypertension BP Readings from Last 3 Encounters:  12/04/15 153/78  11/27/15 124/62  11/02/15 128/68   A:  His BP is elevated today but otherwise has been well-controlled.  He is not on an Ace inhibitor despite his DM.  P: - will start low-dose Lisinopril 5mg  daily.  Will put in future lab order for BMET in 2 weeks. - with starting lisinopril, I have asked patient to hold his potassium supplementation - continue carvedilol 25mg  BID - continue chlorthalidone 25mg  daily.  In future, if BP well controlled may consider decreasing this and increasing lisinopril.

## 2015-12-04 NOTE — Assessment & Plan Note (Signed)
A: Body mass index is 41.26 kg/(m^2).  P: - encouraged weight loss, diet and exercise

## 2015-12-04 NOTE — Progress Notes (Signed)
Internal Medicine Clinic Attending  Case discussed with Dr. Wallace at the time of the visit.  We reviewed the resident's history and exam and pertinent patient test results.  I agree with the assessment, diagnosis, and plan of care documented in the resident's note.  

## 2015-12-04 NOTE — Patient Instructions (Signed)
Thank you for coming to see me today. It was a pleasure. Today we talked about:   Scrotal swelling: I think you are getting better but follow up with [place] in 1-2 weeks if not getting better and we may need to send you to a urologist.  For now, continue rest, scrotal elevation, and anti-inflammatories.  Diabetes: I have refilled your invokana for 300mg  daily to better control your diabetes.  Continue taking teh other medications as prescribed including Lantus.  Please follow-up with me in 2 months.  If you have any questions or concerns, please do not hesitate to call the office at [phone].  Take Care,   Jule Ser, DO

## 2015-12-04 NOTE — Assessment & Plan Note (Signed)
BP Readings from Last 3 Encounters:  12/04/15 153/78  11/27/15 124/62  11/02/15 128/68   A:  His BP is elevated today but otherwise has been well-controlled.  He is not on an Ace inhibitor despite his DM.  P: - will start low-dose Lisinopril 5mg  daily.  Will put in future lab order for BMET in 2 weeks. - with starting lisinopril, I have asked patient to hold his potassium supplementation - continue carvedilol 25mg  BID - continue chlorthalidone 25mg  daily.  In future, if BP well controlled may consider decreasing this and increasing lisinopril.

## 2015-12-04 NOTE — Assessment & Plan Note (Signed)
A: - He is overdue for eye exam and foot exam. - He needs HCV screening - He needs colon cancer screening - He needs repeat lipid panel. - HIV was negative 11/27/15

## 2015-12-04 NOTE — Assessment & Plan Note (Signed)
A: Patient seen today for ED follow up for abdominal pain that he believes is related to constipation and scrotal pain and swelling.  His abdominal pain has improved and he reports having large BM yesterday which significantly helped.  He had a negative CT scan of abdomen at ED visit on 11/27/15.  His scrotal pain has improved and now describes as discomfort when he walks, strains himself, or applies pressure to the area.  His left scrotum is more swollen than right on exam and only mildly tender.  In the ED, his UA was negative for nitrites or leukocytes and there was many bacteria on microscopy.  His GC/CT were negative.  His scrotal ultrasound was reassuring.  He was treated with CTX and azithromycin.  He currently denies any fevers, chills, discharge, flank pain.  His WBC was elevated to 15 in the ED which may have been due to stress reaction.  He had some initial burning with urination with the swelling but that is now better.   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  P:  - will conservatively manage as he reports improvement in his symptoms. - if this were an infectious cause of his pain and swelling such as epididymitis, then he was appropriately treated in the ED. - I suspect this is a non-infectious cause and recommended he continue NSAIDs, rest, scrotal elevation, warm baths - if he is not better in 1-2 weeks, I have asked him to come back for re-evaluation and possible referral to urology.  If he is not better or worsens, he may benefit from a course of levofloxacin to cover enteric organisms. - I recommended he try to maintain normal BM's with daily stool softeners. - RTC as needed.

## 2015-12-04 NOTE — Assessment & Plan Note (Signed)
Lab Results  Component Value Date   HGBA1C 7.2 03/17/2015   A: Patient reports that he has not been taking his Lantus 10units daily due to side effect of constipation.  He reports compliance with other medications including glimepiride 4mg  BID and Invokana 100mg  daily.  P: - continue Lantus 10units QHS.  Recommended he try taking stool softener daily to maintain regular BM - continue Glimepiride 4mg  BID - I have increased his Invokana to 300mg  daily - at next visit, he needs foot exam and eye exam - I have restarted his Lisinopril for renal benefit - RTC 2 months for repeat A1c

## 2016-01-08 ENCOUNTER — Other Ambulatory Visit: Payer: Self-pay | Admitting: Internal Medicine

## 2016-01-08 NOTE — Telephone Encounter (Signed)
Requesting patient Coreg to be filled as he is completely out of medication

## 2016-01-10 MED ORDER — CARVEDILOL 25 MG PO TABS: 25.0000 mg | ORAL_TABLET | Freq: Two times a day (BID) | ORAL | 2 refills | Status: DC

## 2016-01-18 ENCOUNTER — Encounter: Payer: Self-pay | Admitting: Internal Medicine

## 2016-01-18 ENCOUNTER — Ambulatory Visit (INDEPENDENT_AMBULATORY_CARE_PROVIDER_SITE_OTHER): Payer: Self-pay | Admitting: Internal Medicine

## 2016-01-18 VITALS — BP 149/75 | HR 69 | Temp 98.1°F | Ht 66.0 in | Wt 256.9 lb

## 2016-01-18 DIAGNOSIS — E1165 Type 2 diabetes mellitus with hyperglycemia: Secondary | ICD-10-CM

## 2016-01-18 DIAGNOSIS — Z0000 Encounter for general adult medical examination without abnormal findings: Secondary | ICD-10-CM

## 2016-01-18 DIAGNOSIS — I5042 Chronic combined systolic (congestive) and diastolic (congestive) heart failure: Secondary | ICD-10-CM

## 2016-01-18 DIAGNOSIS — Z794 Long term (current) use of insulin: Secondary | ICD-10-CM

## 2016-01-18 DIAGNOSIS — I509 Heart failure, unspecified: Secondary | ICD-10-CM

## 2016-01-18 DIAGNOSIS — F4321 Adjustment disorder with depressed mood: Secondary | ICD-10-CM

## 2016-01-18 DIAGNOSIS — E785 Hyperlipidemia, unspecified: Secondary | ICD-10-CM

## 2016-01-18 DIAGNOSIS — F329 Major depressive disorder, single episode, unspecified: Secondary | ICD-10-CM

## 2016-01-18 DIAGNOSIS — Z6841 Body Mass Index (BMI) 40.0 and over, adult: Secondary | ICD-10-CM

## 2016-01-18 DIAGNOSIS — I10 Essential (primary) hypertension: Secondary | ICD-10-CM

## 2016-01-18 DIAGNOSIS — IMO0001 Reserved for inherently not codable concepts without codable children: Secondary | ICD-10-CM

## 2016-01-18 DIAGNOSIS — Z79899 Other long term (current) drug therapy: Secondary | ICD-10-CM

## 2016-01-18 DIAGNOSIS — R4589 Other symptoms and signs involving emotional state: Secondary | ICD-10-CM

## 2016-01-18 DIAGNOSIS — M533 Sacrococcygeal disorders, not elsewhere classified: Secondary | ICD-10-CM

## 2016-01-18 DIAGNOSIS — I110 Hypertensive heart disease with heart failure: Secondary | ICD-10-CM

## 2016-01-18 LAB — GLUCOSE, CAPILLARY: Glucose-Capillary: 210 mg/dL — ABNORMAL HIGH (ref 65–99)

## 2016-01-18 LAB — POCT GLYCOSYLATED HEMOGLOBIN (HGB A1C): Hemoglobin A1C: 9.5

## 2016-01-18 LAB — HM DIABETES EYE EXAM

## 2016-01-18 MED ORDER — NAPROXEN 500 MG PO TABS: 500.0000 mg | ORAL_TABLET | Freq: Two times a day (BID) | ORAL | 0 refills | Status: DC

## 2016-01-18 MED ORDER — LOSARTAN POTASSIUM 50 MG PO TABS: 50.0000 mg | ORAL_TABLET | Freq: Every day | ORAL | 2 refills | Status: DC

## 2016-01-18 MED ORDER — METHOCARBAMOL 500 MG PO TABS: 500.0000 mg | ORAL_TABLET | Freq: Two times a day (BID) | ORAL | 0 refills | Status: DC

## 2016-01-18 MED ORDER — CANAGLIFLOZIN 100 MG PO TABS: 100.0000 mg | ORAL_TABLET | Freq: Every day | ORAL | 2 refills | Status: DC

## 2016-01-18 NOTE — Progress Notes (Signed)
CC: here for acute complaint of low back pain and f/u of HTN and DM  HPI:  [name] is a 60 y.o. man with a past medical history listed below here today for follow up of his HTN and DM.  He also has acute complaint today for low back pain.  Patient reports falling out of chair when the chair he was sitting in broke.  Fell on his coccyx.  Describes a throbbing pain.  He has been sitting on an ice pack as needed and taking left over naproxen and Robaxin from an ED visit in July.  His pain has improved significantly over the last couple days.  He was able to ambulate through the clinic today to get his eye exam without difficulty.  For details of today's visit and the status of his chronic medical issues please refer to the assessment and plan.   Past Medical History:  Diagnosis Date  . Arthritis   . Asthma   . CHF (congestive heart failure) ([place])   . Diabetes mellitus 2003  . GERD (gastroesophageal reflux disease)   . Hyperlipidemia   . Hypertension   . Morbid obesity ([place])   . Sleep apnea     Review of Systems:   Review of Systems  Constitutional: Negative.   Respiratory: Negative for cough and shortness of breath.   Cardiovascular: Negative for chest pain and leg swelling.  Gastrointestinal: Negative for abdominal pain, diarrhea, nausea and vomiting.  Genitourinary: Negative for dysuria.  Musculoskeletal: Positive for back pain and falls.  Psychiatric/Behavioral: Positive for depression. Negative for suicidal ideas. The patient is not nervous/anxious.     Physical Exam:  Vitals:   01/18/16 1456  BP: (!) 149/75  Pulse: 69  Temp: 98.1 F (36.7 C)  TempSrc: Oral  SpO2: 99%  Weight: 256 lb 14.4 oz (116.5 kg)  Height: 5\' 6"  (1.676 m)   Physical Exam  Constitutional: He is well-developed, well-nourished, and in no distress.  HENT:  Head: Normocephalic and atraumatic.  Cardiovascular: Normal rate, regular rhythm and intact distal pulses.   Pulmonary/Chest:  Effort normal.  Musculoskeletal: Normal range of motion. He exhibits no edema.  Evidence of onychomychosis.  Skin: Skin is warm and dry. No rash noted. No erythema.  Psychiatric: Mood and affect normal.    Assessment & Plan:   See Encounters Tab for problem based charting.  Patient discussed with Dr. Eppie Gibson   Congestive heart failure A: Previously seen by cardiology, Dr. Einar Gip, in the past.  Review of old records indicate heart cath in 07/2006 with NICM, EF 45%.  Repeat echo Oct 2009 with EF 45%.  May 2011 with normal EF.  Appears euvolemic on exam.  Does not report symptoms of PND or orthopnea.  P: - on diuretic and beta blocker.  Has been non-adherent to lisinopril due to reported side effect of palpitations. - will continue diuretic and beta blocker.  Start losartan 50mg  daily - RTC in 2 weeks for BMET with addition of losartan.  Essential hypertension A: BP Readings from Last 3 Encounters:  01/18/16 (!) 149/75  12/04/15 (!) 153/78  11/27/15 124/62   A:  BP again elevated today but patient has not been taking lisinopril as prescribed at last visit due to reported side effect of palpitations in the past.  He also did not come back to repeat a BMET due to this.    P: - will start losartan 50mg  daily - with starting losartan, I have asked patient to hold  his potassium supplementation - take lisinopril off medication list - continue carvedilol 25mg  BID - continue chlorthalidone 25mg  daily.  In future, if BP well controlled may consider decreasing this and increasing losartan. - RTC 2 weeks for BP check and BMET  Morbid obesity ([place]) A: Body mass index is 41.46 kg/m.  P: - encouraged continued weight loss, diet, and exercise.  Diabetes mellitus type 2, uncontrolled, without complications Lab Results  Component Value Date   HGBA1C 9.5 01/18/2016   A: Patient has not been taking Lantus daily.  States takes about 3 times per week.  He did not increase his Invokana to  300mg  daily and has been taking only 100mg  daily.  He continues to take his glimepiride 4mg  BID.  He checks his blood sugar two to three times per day.  Averaging about 200 per his report.  No meter brought in today.   A1c today is 9.5.  P: - continue glimepiride 4mg  BID. - continue Invokana 100mg  daily.  States the 300mg  dose was too expensive as well. - encouraged daily use of his Lantus as opposed to PRN like he is doing.  If he is adherent to this and blood sugars remain elevated, we can consider increasing this. - encouraged appropriate diabetic diet. - eye exam and foot exam done today.  Hyperlipidemia A: Patient reports not taking his rosuvastatin for "many months, probably at least a year".  Previously had not taken because he was trying to work on his diet to help his cholesterol and he reports this medication makes him feel tired.  Repeat lipid panel today indeed shows that he has not been taking his meds: TC 320, LDL 225, HDL 34.  P: - ideally he should be on high intensity statin with rosuvastatin 20-40mg  or atorvastatin 40-80mg  daily. - I have encouraged him to at least attempt taking his current dose of Crestor which previous notes from his endocrinologist indicate he tolerated.  If he is able to do so will recommend increasing.  He reports previously not being able to tolerate atorvastatin.  Previous notes indicate nausea.  Healthcare maintenance A/P: - eye exam done today. - foot exam done today. - lipid panel done today. - needs screening for colon cancer. - needs PPSV 23 given his DM.  Coccyalgia A: Patient reports falling out of chair when the chair he was sitting in broke.  Fell on his coccyx.  Describes a throbbing pain.  He has been sitting on an ice pack as needed and taking left over naproxen and Robaxin from an ED visit in July.  His pain has improved significantly over the last couple days.  He was able to ambulate through the clinic today to get his eye exam  without difficulty.  P: - continue supportive care with rest, ice, activity as tolerated. - do not see indication for imaging at this point given his pain is improving. - given a refill for his naproxen and Robaxin for 1 time during acute injury.  Cautioned on NSAIDs impacting his blood pressure - RTC as needed for complaint.   Depressed mood A: Patient reports feeling down and hopeless recently with passing of his mother in recent months.  Denies SI/HI.  Is not interested in medications but is inquiring about therapy.  PHQ is 0.  P: - continue to follow his PHQ score - given information on counseling with [facility].

## 2016-01-18 NOTE — Patient Instructions (Signed)
Thank you for coming to see me today. It was a pleasure. Today we talked about:   Diabetes: - please take 10 units of Lantus at night - continue taking glimepiride and invokana as instructed.  Blood Pressure: - I have removed lisinopril from your medication list - I have added losartan 50mg  daily.  Please come back in about 2 weeks to check on your potassium levels.  Please do not take potassium supplements with this medication.  Back Pain: - I have refilled your naproxen and robaxin to take as needed.  Please follow-up with [place] in 2 weeks.  If you have any questions or concerns, please do not hesitate to call the office at [phone].  Take Care,   Jule Ser, DO

## 2016-01-19 ENCOUNTER — Encounter: Payer: Self-pay | Admitting: *Deleted

## 2016-01-19 DIAGNOSIS — F329 Major depressive disorder, single episode, unspecified: Secondary | ICD-10-CM

## 2016-01-19 DIAGNOSIS — M533 Sacrococcygeal disorders, not elsewhere classified: Secondary | ICD-10-CM | POA: Insufficient documentation

## 2016-01-19 DIAGNOSIS — R4589 Other symptoms and signs involving emotional state: Secondary | ICD-10-CM | POA: Insufficient documentation

## 2016-01-19 LAB — LIPID PANEL
Chol/HDL Ratio: 9.4 ratio units — ABNORMAL HIGH (ref 0.0–5.0)
Cholesterol, Total: 320 mg/dL — ABNORMAL HIGH (ref 100–199)
HDL: 34 mg/dL — ABNORMAL LOW (ref >39)
LDL CALC: 225 mg/dL — AB (ref 0–99)
Triglycerides: 304 mg/dL — ABNORMAL HIGH (ref 0–149)
VLDL CHOLESTEROL CAL: 61 mg/dL — AB (ref 5–40)

## 2016-01-19 NOTE — Addendum Note (Signed)
Addended by: Orson Gear on: 01/19/2016 04:27 PM   Modules accepted: Orders

## 2016-01-19 NOTE — Assessment & Plan Note (Signed)
A: Patient reports falling out of chair when the chair he was sitting in broke.  Fell on his coccyx.  Describes a throbbing pain.  He has been sitting on an ice pack as needed and taking left over naproxen and Robaxin from an ED visit in July.  His pain has improved significantly over the last couple days.  He was able to ambulate through the clinic today to get his eye exam without difficulty.  P: - continue supportive care with rest, ice, activity as tolerated. - do not see indication for imaging at this point given his pain is improving. - given a refill for his naproxen and Robaxin for 1 time during acute injury.  Cautioned on NSAIDs impacting his blood pressure - RTC as needed for complaint.

## 2016-01-19 NOTE — Assessment & Plan Note (Addendum)
A: Patient reports not taking his rosuvastatin for "many months, probably at least a year".  Previously had not taken because he was trying to work on his diet to help his cholesterol and he reports this medication makes him feel tired.  Repeat lipid panel today indeed shows that he has not been taking his meds: TC 320, LDL 225, HDL 34.  P: - ideally he should be on high intensity statin with rosuvastatin 20-40mg  or atorvastatin 40-80mg  daily. - I have encouraged him to at least attempt taking his current dose of Crestor which previous notes from his endocrinologist indicate he tolerated.  If he is able to do so will recommend increasing.  He reports previously not being able to tolerate atorvastatin.  Previous notes indicate nausea.

## 2016-01-19 NOTE — Assessment & Plan Note (Signed)
A/P: - eye exam done today. - foot exam done today. - lipid panel done today. - needs screening for colon cancer. - needs PPSV 23 given his DM.

## 2016-01-19 NOTE — Assessment & Plan Note (Signed)
A: Body mass index is 41.46 kg/m.  P: - encouraged continued weight loss, diet, and exercise.

## 2016-01-19 NOTE — Assessment & Plan Note (Signed)
A: BP Readings from Last 3 Encounters:  01/18/16 (!) 149/75  12/04/15 (!) 153/78  11/27/15 124/62   A:  BP again elevated today but patient has not been taking lisinopril as prescribed at last visit due to reported side effect of palpitations in the past.  He also did not come back to repeat a BMET due to this.    P: - will start losartan 50mg  daily - with starting losartan, I have asked patient to hold his potassium supplementation - take lisinopril off medication list - continue carvedilol 25mg  BID - continue chlorthalidone 25mg  daily.  In future, if BP well controlled may consider decreasing this and increasing losartan. - RTC 2 weeks for BP check and BMET

## 2016-01-19 NOTE — Assessment & Plan Note (Signed)
A: Previously seen by cardiology, Dr. Einar Gip, in the past.  Review of old records indicate heart cath in 07/2006 with NICM, EF 45%.  Repeat echo Oct 2009 with EF 45%.  May 2011 with normal EF.  Appears euvolemic on exam.  Does not report symptoms of PND or orthopnea.  P: - on diuretic and beta blocker.  Has been non-adherent to lisinopril due to reported side effect of palpitations. - will continue diuretic and beta blocker.  Start losartan 50mg  daily - RTC in 2 weeks for BMET with addition of losartan.

## 2016-01-19 NOTE — Progress Notes (Signed)
Case discussed with Dr. Wallace soon after the resident saw the patient.  We reviewed the resident's history and exam and pertinent patient test results.  I agree with the assessment, diagnosis and plan of care documented in the resident's note. 

## 2016-01-19 NOTE — Assessment & Plan Note (Signed)
Lab Results  Component Value Date   HGBA1C 9.5 01/18/2016   A: Patient has not been taking Lantus daily.  States takes about 3 times per week.  He did not increase his Invokana to 300mg  daily and has been taking only 100mg  daily.  He continues to take his glimepiride 4mg  BID.  He checks his blood sugar two to three times per day.  Averaging about 200 per his report.  No meter brought in today.   A1c today is 9.5.  P: - continue glimepiride 4mg  BID. - continue Invokana 100mg  daily.  States the 300mg  dose was too expensive as well. - encouraged daily use of his Lantus as opposed to PRN like he is doing.  If he is adherent to this and blood sugars remain elevated, we can consider increasing this. - encouraged appropriate diabetic diet. - eye exam and foot exam done today.

## 2016-01-19 NOTE — Assessment & Plan Note (Signed)
A: Patient reports feeling down and hopeless recently with passing of his mother in recent months.  Denies SI/HI.  Is not interested in medications but is inquiring about therapy.  PHQ is 0.  P: - continue to follow his PHQ score - given information on counseling with [facility].

## 2016-01-24 ENCOUNTER — Other Ambulatory Visit: Payer: Self-pay | Admitting: Internal Medicine

## 2016-01-25 ENCOUNTER — Encounter: Payer: Self-pay | Admitting: Dietician

## 2016-01-31 ENCOUNTER — Telehealth: Payer: Self-pay | Admitting: Internal Medicine

## 2016-01-31 NOTE — Telephone Encounter (Signed)
APT. REMINDER CALL, LMTCB °

## 2016-02-01 ENCOUNTER — Encounter: Payer: Self-pay | Admitting: Internal Medicine

## 2016-02-01 ENCOUNTER — Ambulatory Visit (INDEPENDENT_AMBULATORY_CARE_PROVIDER_SITE_OTHER): Payer: Self-pay | Admitting: Internal Medicine

## 2016-02-01 VITALS — BP 144/66 | HR 70 | Temp 98.5°F | Ht 66.0 in | Wt 256.4 lb

## 2016-02-01 DIAGNOSIS — G4730 Sleep apnea, unspecified: Secondary | ICD-10-CM | POA: Insufficient documentation

## 2016-02-01 DIAGNOSIS — I10 Essential (primary) hypertension: Secondary | ICD-10-CM

## 2016-02-01 DIAGNOSIS — M533 Sacrococcygeal disorders, not elsewhere classified: Secondary | ICD-10-CM

## 2016-02-01 LAB — GLUCOSE, CAPILLARY: Glucose-Capillary: 225 mg/dL — ABNORMAL HIGH (ref 65–99)

## 2016-02-01 MED ORDER — PANTOPRAZOLE SODIUM 40 MG PO TBEC: 40.0000 mg | DELAYED_RELEASE_TABLET | Freq: Every day | ORAL | 0 refills | Status: DC

## 2016-02-01 MED ORDER — METHOCARBAMOL 500 MG PO TABS: 500.0000 mg | ORAL_TABLET | Freq: Two times a day (BID) | ORAL | 0 refills | Status: DC

## 2016-02-01 NOTE — Progress Notes (Signed)
   CC: here for follow up BP and back pain from fall  HPI:  Thomas Gardner is a 60 y.o. man with a past medical history listed below here today for follow up of his HTN and low back pain from fall.   For details of today's visit and the status of his chronic medical issues please refer to the assessment and plan.   Past Medical History:  Diagnosis Date  . Arthritis   . Asthma   . CHF (congestive heart failure) ([place])   . Diabetes mellitus 2003  . GERD (gastroesophageal reflux disease)   . Hyperlipidemia   . Hypertension   . Morbid obesity ([place])   . Sleep apnea     Review of Systems:   Please see pertinent ROS reviewed in HPI and problem based charting.   Physical Exam:  Vitals:   02/01/16 1515  BP: (!) 144/66  Pulse: 70  Temp: 98.5 F (36.9 C)  TempSrc: Oral  SpO2: 98%  Weight: 256 lb 6.4 oz (116.3 kg)  Height: 5\' 6"  (1.676 m)   Physical Exam  Constitutional: He is oriented to person, place, and time and well-developed, well-nourished, and in no distress.  HENT:  Head: Normocephalic and atraumatic.  Cardiovascular: Normal rate and regular rhythm.   Pulmonary/Chest: Effort normal and breath sounds normal.  Musculoskeletal: Normal range of motion.  Neurological: He is alert and oriented to person, place, and time. Gait normal.  Skin: Skin is warm and dry.  Psychiatric: Mood and affect normal.     Assessment & Plan:   See Encounters Tab for problem based charting.  Patient discussed with Dr. Eppie Gibson.  Essential hypertension BP Readings from Last 3 Encounters:  02/01/16 (!) 144/66  01/18/16 (!) 149/75  12/04/15 (!) 153/78   A: His BP is only slightly better today.  He reports adherence to losartan since starting it a couple weeks ago.  Initially he experienced some "jitteriness" but that has resolved itself.  He is otherwise adherent to his carvedilol and chlorthalidone.  He has reasons to have difficult BP to control such as current NSAID use,  obstructive sleep apnea with non-adherence in the past to CPAP, and morbid obesity.    P: - we will continue medications at current doses of carvedilol 25mg  BID, chlorthalidone 25mg  daily, and losartan 50mg  daily as he is near BP goal. - encouraged healthy diet and exercise choices. - at follow up will inquire more about CPAP usage. - BMET today after starting losartan recently. - RTC in 2 months.  Coccyalgia A: Patient still having pain from his fall on to his coccyx. Pain is not worsening but has been slow to improve.  He is sparingly using naproxen only at bedtime and finds greater relief with Robaxin.  He had to call out of work today and requests a work note until Monday.  P: - we discussed that this could be slow to heal and if there was a fracture of the coccyx that management would likely be the same. - continue judicious use of Naproxen. I provided him with 30 Protonix today since he has a history of gastritis in setting of NSAIDs. - continue other conservative management with rest, ice and heat as tolerated, return to activity as tolerated. - work note provided. - refill given for his Robaxin today.

## 2016-02-01 NOTE — Patient Instructions (Addendum)
Keep doing what you are doing for your back pain: using Naproxen sparingly, Robaxin as directed, rest, ice, and sitting on the donut.  I have given you Protonix for GI protection while taking Naproxen.  We have given you stool cards for colon cancer screening.  We will talk about flu and pneumonia vaccine next time.  Please follow up in about 2 months.

## 2016-02-01 NOTE — Assessment & Plan Note (Signed)
A: Patient still having pain from his fall on to his coccyx. Pain is not worsening but has been slow to improve.  He is sparingly using naproxen only at bedtime and finds greater relief with Robaxin.  He had to call out of work today and requests a work note until Monday.  P: - we discussed that this could be slow to heal and if there was a fracture of the coccyx that management would likely be the same. - continue judicious use of Naproxen. I provided him with 30 Protonix today since he has a history of gastritis in setting of NSAIDs. - continue other conservative management with rest, ice and heat as tolerated, return to activity as tolerated. - work note provided. - refill given for his Robaxin today.

## 2016-02-01 NOTE — Assessment & Plan Note (Signed)
BP Readings from Last 3 Encounters:  02/01/16 (!) 144/66  01/18/16 (!) 149/75  12/04/15 (!) 153/78   A: His BP is only slightly better today.  He reports adherence to losartan since starting it a couple weeks ago.  Initially he experienced some "jitteriness" but that has resolved itself.  He is otherwise adherent to his carvedilol and chlorthalidone.  He has reasons to have difficult BP to control such as current NSAID use, obstructive sleep apnea with non-adherence in the past to CPAP, and morbid obesity.    P: - we will continue medications at current doses of carvedilol 25mg  BID, chlorthalidone 25mg  daily, and losartan 50mg  daily as he is near BP goal. - encouraged healthy diet and exercise choices. - at follow up will inquire more about CPAP usage. - BMET today after starting losartan recently. - RTC in 2 months.

## 2016-02-02 LAB — BMP8+ANION GAP
ANION GAP: 20 mmol/L — AB (ref 10.0–18.0)
BUN/Creatinine Ratio: 22 (ref 10–24)
BUN: 22 mg/dL (ref 8–27)
CHLORIDE: 97 mmol/L (ref 96–106)
CO2: 23 mmol/L (ref 18–29)
CREATININE: 1.02 mg/dL (ref 0.76–1.27)
Calcium: 9.9 mg/dL (ref 8.6–10.2)
GFR calc Af Amer: 92 mL/min/{1.73_m2} (ref >59)
GFR, EST NON AFRICAN AMERICAN: 80 mL/min/{1.73_m2} (ref >59)
Glucose: 180 mg/dL — ABNORMAL HIGH (ref 65–99)
Potassium: 3.6 mmol/L (ref 3.5–5.2)
SODIUM: 140 mmol/L (ref 134–144)

## 2016-02-06 NOTE — Progress Notes (Signed)
Case discussed with Dr. Wallace at the time of the visit.  We reviewed the resident's history and exam and pertinent patient test results.  I agree with the assessment, diagnosis and plan of care documented in the resident's note. 

## 2016-02-20 ENCOUNTER — Other Ambulatory Visit: Payer: Self-pay | Admitting: Internal Medicine

## 2016-02-20 NOTE — Telephone Encounter (Signed)
Needs mid nov appt PCP DM F/u

## 2016-03-17 ENCOUNTER — Other Ambulatory Visit: Payer: Self-pay | Admitting: Internal Medicine

## 2016-03-20 NOTE — Telephone Encounter (Signed)
Hui from [facility] requesting canagliflozin (INVOKANA) 100 MG TAB, to be filled for pt. Please call back.

## 2016-03-20 NOTE — Telephone Encounter (Signed)
Rx sent to [facility] ([facility] [address]) on 01/18/16 for #30 tablets, 2 refills so should still be able to fill this medication.  Please let me know if he is unable for some reason.  Thanks, Mitzi Hansen

## 2016-03-21 ENCOUNTER — Telehealth: Payer: Self-pay

## 2016-03-21 NOTE — Telephone Encounter (Signed)
Called, pharmacist states someone has returned the call and given a verbal script

## 2016-03-21 NOTE — Telephone Encounter (Signed)
Hui from [facility] requesting the nurse to call back regarding invokana.

## 2016-03-21 NOTE — Telephone Encounter (Addendum)
Call made to [facility] never received rx.  Upon further review of pt's chart-the rx written on 08/31 did not get sent electronically as the "order status" was set to "no print".  I went ahead and phoned rx into pharmacy.  Will make pcp aware.Despina Hidden Cassady11/2/20171:47 PM

## 2016-03-22 ENCOUNTER — Other Ambulatory Visit: Payer: Self-pay | Admitting: Internal Medicine

## 2016-03-27 NOTE — Telephone Encounter (Signed)
Refill request sent to pcp for review, also sent to front office for appt scheduling.Regenia Skeeter, Deby Adger Cassady11/8/20179:22 AM

## 2016-04-22 ENCOUNTER — Other Ambulatory Visit: Payer: Self-pay | Admitting: Internal Medicine

## 2016-04-22 DIAGNOSIS — IMO0001 Reserved for inherently not codable concepts without codable children: Secondary | ICD-10-CM

## 2016-04-22 DIAGNOSIS — E1165 Type 2 diabetes mellitus with hyperglycemia: Principal | ICD-10-CM

## 2016-04-22 DIAGNOSIS — I10 Essential (primary) hypertension: Secondary | ICD-10-CM

## 2016-04-25 ENCOUNTER — Other Ambulatory Visit: Payer: Self-pay | Admitting: *Deleted

## 2016-04-25 MED ORDER — CHLORTHALIDONE 25 MG PO TABS: 25.0000 mg | ORAL_TABLET | Freq: Every day | ORAL | 3 refills | Status: DC

## 2016-05-20 HISTORY — PX: OTHER SURGICAL HISTORY: SHX169

## 2016-05-30 ENCOUNTER — Ambulatory Visit (INDEPENDENT_AMBULATORY_CARE_PROVIDER_SITE_OTHER): Payer: Self-pay | Admitting: Internal Medicine

## 2016-05-30 ENCOUNTER — Encounter: Payer: Self-pay | Admitting: Internal Medicine

## 2016-05-30 VITALS — BP 131/70 | HR 75 | Temp 98.4°F | Ht 66.0 in | Wt 247.1 lb

## 2016-05-30 DIAGNOSIS — Z9114 Patient's other noncompliance with medication regimen: Secondary | ICD-10-CM

## 2016-05-30 DIAGNOSIS — Z794 Long term (current) use of insulin: Secondary | ICD-10-CM

## 2016-05-30 DIAGNOSIS — Z6839 Body mass index (BMI) 39.0-39.9, adult: Secondary | ICD-10-CM

## 2016-05-30 DIAGNOSIS — Z79899 Other long term (current) drug therapy: Secondary | ICD-10-CM

## 2016-05-30 DIAGNOSIS — IMO0001 Reserved for inherently not codable concepts without codable children: Secondary | ICD-10-CM

## 2016-05-30 DIAGNOSIS — E1165 Type 2 diabetes mellitus with hyperglycemia: Secondary | ICD-10-CM

## 2016-05-30 DIAGNOSIS — E785 Hyperlipidemia, unspecified: Secondary | ICD-10-CM

## 2016-05-30 DIAGNOSIS — I10 Essential (primary) hypertension: Secondary | ICD-10-CM

## 2016-05-30 LAB — POCT GLYCOSYLATED HEMOGLOBIN (HGB A1C): HEMOGLOBIN A1C: 12.4

## 2016-05-30 LAB — GLUCOSE, CAPILLARY: GLUCOSE-CAPILLARY: 274 mg/dL — AB (ref 65–99)

## 2016-05-30 MED ORDER — ROSUVASTATIN CALCIUM 20 MG PO TABS: 20.0000 mg | ORAL_TABLET | Freq: Every day | ORAL | 5 refills | Status: DC

## 2016-05-30 MED ORDER — ALBUTEROL SULFATE HFA 108 (90 BASE) MCG/ACT IN AERS: 2.0000 | INHALATION_SPRAY | Freq: Four times a day (QID) | RESPIRATORY_TRACT | 2 refills | Status: DC | PRN

## 2016-05-30 NOTE — Patient Instructions (Addendum)
Thank you for coming to see me today. It was a pleasure. Today we talked about:   Diabetes: we need to improve your A1C.  Please take your Lantus every night.  Please check your blood sugars 1 - 2 times per day.  I have placed a referral for you to see Debera Lat for diabetes counseling.  High Cholesterol: I have increased your Crestor to 20mg  daily.   Please follow-up with [place] in 2 weeks with your meter to follow your blood sugars.  Please also schedule a visit with Marlana Latus and Dr. Maudie Mercury (pharmacist) to assist with medications.  If you have any questions or concerns, please do not hesitate to call the office at [phone].  Take Care,   Jule Ser, DO

## 2016-05-30 NOTE — Progress Notes (Signed)
   CC: here for f/u of DM, HTN, and HLD  HPI:  [name] is a 61 y.o. man with a past medical history listed below here today for follow up of his DM, HTN, and HLD.   For details of today's visit and the status of his chronic medical issues please refer to the assessment and plan.   Past Medical History:  Diagnosis Date  . Arthritis   . Asthma   . CHF (congestive heart failure) ([place])   . Diabetes mellitus 2003  . GERD (gastroesophageal reflux disease)   . Hyperlipidemia   . Hypertension   . Morbid obesity ([place])   . Sleep apnea     Review of Systems:  Please see pertinent ROS reviewed in HPI and problem based charting.   Physical Exam:  Vitals:   05/30/16 1356 05/30/16 1433  BP: 96/63 131/70  Pulse: 73 75  Temp: 98.4 F (36.9 C)   TempSrc: Oral   SpO2: 98%   Weight: 247 lb 1.6 oz (112.1 kg)   Height: 5\' 6"  (1.676 m)    Physical Exam   Assessment & Plan:   See Encounters Tab for problem based charting.  Patient discussed with Dr. Dareen Piano.  Essential hypertension BP Readings from Last 3 Encounters:  05/30/16 131/70  02/01/16 (!) 144/66  01/18/16 (!) 149/75   A: BP well-controlled today on current regimen.  Denies any headaches, lightheadedness, blurry vision, chest pain.  P: - continue carvedilol 25mg  BID, chlorthalidone 25mg  daily, and losartan 50mg  daily - BMET in Sept 2017 with normal renal function   Diabetes mellitus type 2, uncontrolled, without complications ([place]) Lab Results  Component Value Date   HGBA1C 12.4 05/30/2016   A: His A1c has deteriorated even further and his DM is under poor control with A1c of 12.4.  He did not bring his meter and reports checking may a few times per week with averages of 220-240.  He reports taking his oral medications consistently but has non-adherence to other oral medications (i.e. Crestor) so I wonder about the accuracy of this.  He takes his Lantus infrequently and only when he "feels funny".  He  reports symptoms of polyuria and polydipsia.  P:  - continue glimperide 4mg  BID and Invokana 100mg  daily - continue Lantus 10 units daily.  Stressed the importance of daily administration. - instructed to check CBGs more frequently and consistently over the next couple weeks to better assess his glycemic control - previous notes from his endocrinologist indicate he did not tolerate metformin (mood swings, confusion) - refer to DM educator today - f/u in 2 weeks  Hyperlipidemia A: He has been taking his Crestor 5mg  daily "maybe 2 or 3 times per week" and at least tolerating it well.  His lipid panel in August indicates a 10-year ASCVD risk of 31% based on his HTN and DM as well.  He needs to be on high-intensity statin.  He is agreeable to trying Crestor 20mg  daily.  P: - increase Crestor to 20mg  daily - assess tolerance at follow up - consider repeat lipid panel in 4-6 weeks to assess response and/or adherence.  Morbid obesity ([facility]) A: Body mass index is 39.88 kg/m.  This is an improvement from August.  P:  - continue to encourage weight loss, diet and exercise.

## 2016-05-31 NOTE — Progress Notes (Signed)
Internal Medicine Clinic Attending  Case discussed with Dr. Wallace at the time of the visit.  We reviewed the resident's history and exam and pertinent patient test results.  I agree with the assessment, diagnosis, and plan of care documented in the resident's note.  

## 2016-05-31 NOTE — Assessment & Plan Note (Signed)
A: He has been taking his Crestor 5mg  daily "maybe 2 or 3 times per week" and at least tolerating it well.  His lipid panel in August indicates a 10-year ASCVD risk of 31% based on his HTN and DM as well.  He needs to be on high-intensity statin.  He is agreeable to trying Crestor 20mg  daily.  P: - increase Crestor to 20mg  daily - assess tolerance at follow up - consider repeat lipid panel in 4-6 weeks to assess response and/or adherence.

## 2016-05-31 NOTE — Assessment & Plan Note (Signed)
Lab Results  Component Value Date   HGBA1C 12.4 05/30/2016   A: His A1c has deteriorated even further and his DM is under poor control with A1c of 12.4.  He did not bring his meter and reports checking may a few times per week with averages of 220-240.  He reports taking his oral medications consistently but has non-adherence to other oral medications (i.e. Crestor) so I wonder about the accuracy of this.  He takes his Lantus infrequently and only when he "feels funny".  He reports symptoms of polyuria and polydipsia.  P:  - continue glimperide 4mg  BID and Invokana 100mg  daily - continue Lantus 10 units daily.  Stressed the importance of daily administration. - instructed to check CBGs more frequently and consistently over the next couple weeks to better assess his glycemic control - previous notes from his endocrinologist indicate he did not tolerate metformin (mood swings, confusion) - refer to DM educator today - f/u in 2 weeks

## 2016-05-31 NOTE — Assessment & Plan Note (Signed)
BP Readings from Last 3 Encounters:  05/30/16 131/70  02/01/16 (!) 144/66  01/18/16 (!) 149/75   A: BP well-controlled today on current regimen.  Denies any headaches, lightheadedness, blurry vision, chest pain.  P: - continue carvedilol 25mg  BID, chlorthalidone 25mg  daily, and losartan 50mg  daily - BMET in Sept 2017 with normal renal function

## 2016-05-31 NOTE — Assessment & Plan Note (Signed)
A: Body mass index is 39.88 kg/m.  This is an improvement from August.  P:  - continue to encourage weight loss, diet and exercise.

## 2016-06-14 ENCOUNTER — Telehealth: Payer: Self-pay | Admitting: Internal Medicine

## 2016-06-14 NOTE — Telephone Encounter (Signed)
APT. REMINDER CALL, LMTCB °

## 2016-06-17 ENCOUNTER — Ambulatory Visit (INDEPENDENT_AMBULATORY_CARE_PROVIDER_SITE_OTHER): Payer: Self-pay | Admitting: Internal Medicine

## 2016-06-17 ENCOUNTER — Ambulatory Visit: Payer: Self-pay | Admitting: Dietician

## 2016-06-17 ENCOUNTER — Ambulatory Visit: Payer: Self-pay | Admitting: Pharmacist

## 2016-06-17 VITALS — BP 112/66 | HR 89 | Temp 98.6°F | Ht 66.0 in | Wt 245.8 lb

## 2016-06-17 DIAGNOSIS — E1165 Type 2 diabetes mellitus with hyperglycemia: Secondary | ICD-10-CM

## 2016-06-17 DIAGNOSIS — Z719 Counseling, unspecified: Secondary | ICD-10-CM

## 2016-06-17 DIAGNOSIS — IMO0001 Reserved for inherently not codable concepts without codable children: Secondary | ICD-10-CM

## 2016-06-17 DIAGNOSIS — R0789 Other chest pain: Secondary | ICD-10-CM

## 2016-06-17 DIAGNOSIS — E785 Hyperlipidemia, unspecified: Secondary | ICD-10-CM

## 2016-06-17 DIAGNOSIS — R1010 Upper abdominal pain, unspecified: Secondary | ICD-10-CM

## 2016-06-17 DIAGNOSIS — Z79899 Other long term (current) drug therapy: Secondary | ICD-10-CM

## 2016-06-17 DIAGNOSIS — R05 Cough: Secondary | ICD-10-CM

## 2016-06-17 DIAGNOSIS — J4520 Mild intermittent asthma, uncomplicated: Secondary | ICD-10-CM

## 2016-06-17 DIAGNOSIS — J069 Acute upper respiratory infection, unspecified: Secondary | ICD-10-CM

## 2016-06-17 MED ORDER — CETIRIZINE HCL 10 MG PO TABS: 10.0000 mg | ORAL_TABLET | Freq: Every day | ORAL | 0 refills | Status: DC

## 2016-06-17 MED ORDER — ATORVASTATIN CALCIUM 80 MG PO TABS: 80.0000 mg | ORAL_TABLET | Freq: Every day | ORAL | 1 refills | Status: DC

## 2016-06-17 MED ORDER — INSULIN GLARGINE 100 UNIT/ML SOLOSTAR PEN: 10.0000 [IU] | PEN_INJECTOR | Freq: Every day | SUBCUTANEOUS | 11 refills | Status: DC

## 2016-06-17 MED ORDER — ALBUTEROL SULFATE HFA 108 (90 BASE) MCG/ACT IN AERS: 2.0000 | INHALATION_SPRAY | Freq: Four times a day (QID) | RESPIRATORY_TRACT | 2 refills | Status: DC | PRN

## 2016-06-17 MED ORDER — BENZONATATE 100 MG PO CAPS: 100.0000 mg | ORAL_CAPSULE | Freq: Three times a day (TID) | ORAL | 0 refills | Status: DC | PRN

## 2016-06-17 MED ORDER — LIRAGLUTIDE 18 MG/3ML ~~LOC~~ SOPN: 1.2000 mg | PEN_INJECTOR | Freq: Every day | SUBCUTANEOUS | 11 refills | Status: DC

## 2016-06-17 NOTE — Progress Notes (Signed)
S: Thomas Gardner is a 61 y.o. male reports to clinical pharmacist appointment for medication management. Patient did  bring medication bottles.   Allergies  Allergen Reactions  . Erythromycin Nausea And Vomiting   Medication Sig  albuterol (PROVENTIL HFA;VENTOLIN HFA) 108 (90 Base) MCG/ACT inhaler Inhale 2 puffs into the lungs every 6 (six) hours as needed for wheezing or shortness of breath.  benzonatate (TESSALON) 100 MG capsule Take 1 capsule (100 mg total) by mouth 3 (three) times daily as needed for cough.  canagliflozin (INVOKANA) 100 MG TABS tablet Take 1 tablet (100 mg total) by mouth daily.  carvedilol (COREG) 25 MG tablet TAKE ONE TABLET BY MOUTH TWICE DAILY WITH A MEAL  cetirizine (ZYRTEC) 10 MG tablet Take 1 tablet (10 mg total) by mouth daily.  chlorthalidone (HYGROTON) 25 MG tablet Take 1 tablet (25 mg total) by mouth daily.  Fluticasone Furoate 100 MCG/ACT AEPB Inhale 2 sprays into the lungs daily.  glimepiride (AMARYL) 4 MG tablet TAKE ONE TABLET BY MOUTH TWICE DAILY BEFORE  A  MEAL  Insulin Glargine (LANTUS) 100 UNIT/ML Solostar Pen Inject 10 Units into the skin daily at 10 pm.  Insulin Pen Needle (B-D UF III MINI PEN NEEDLES) 31G X 5 MM MISC Use to inject lantus daily  losartan (COZAAR) 50 MG tablet TAKE ONE TABLET BY MOUTH ONCE DAILY  methocarbamol (ROBAXIN) 500 MG tablet Take 1 tablet (500 mg total) by mouth 2 (two) times daily.  naproxen (NAPROSYN) 500 MG tablet Take 1 tablet (500 mg total) by mouth 2 (two) times daily.  pantoprazole (PROTONIX) 40 MG tablet Take 1 tablet (40 mg total) by mouth daily.  rosuvastatin (CRESTOR) 20 MG tablet Take 1 tablet (20 mg total) by mouth daily at 6 PM.   Past Medical History:  Diagnosis Date  . Arthritis   . Asthma   . CHF (congestive heart failure) ([place])   . Diabetes mellitus 2003  . GERD (gastroesophageal reflux disease)   . Hyperlipidemia   . Hypertension   . Morbid obesity ([place])   . Sleep apnea    Social History    Social History  . Marital status: Single    Spouse name: N/A  . Number of children: N/A  . Years of education: N/A   Occupational History  . Beautician    Social History Main Topics  . Smoking status: Never Smoker  . Smokeless tobacco: Not on file  . Alcohol use No  . Drug use: No  . Sexual activity: Not on file   Other Topics Concern  . Not on file   Social History Narrative  . No narrative on file   Family History  Problem Relation Age of Onset  . Cancer Mother     ovarian  . Diabetes Mother   . Stroke Father   . Cancer Maternal Aunt     breast  . Diabetes Maternal Aunt   . Cancer Maternal Uncle     colon   O:    Component Value Date/Time   CHOL 320 (H) 01/18/2016 1613   HDL 34 (L) 01/18/2016 1613   TRIG 304 (H) 01/18/2016 1613   AST 16 08/08/2014 0835   ALT 15 08/08/2014 0835   NA 140 02/01/2016 1609   K 3.6 02/01/2016 1609   CL 97 02/01/2016 1609   CO2 23 02/01/2016 1609   GLUCOSE 180 (H) 02/01/2016 1609   GLUCOSE 265 (H) 11/27/2015 1700   HGBA1C 12.4 05/30/2016 1405   HGBA1C 6.1 10/24/2014  0851   BUN 22 02/01/2016 1609   CREATININE 1.02 02/01/2016 1609   CREATININE 1.06 10/24/2014 0907   CALCIUM 9.9 02/01/2016 1609   GFRAA 92 02/01/2016 1609   GFRAA 88 10/24/2014 0907   WBC 15.0 (H) 11/27/2015 1700   HGB 15.4 11/27/2015 1700   HCT 45.4 11/27/2015 1700   PLT 182 11/27/2015 1700   TSH 1.130 10/28/2011 1050   Ht Readings from Last 2 Encounters:  06/17/16 5\' 6"  (1.676 m)  05/30/16 5\' 6"  (1.676 m)   Wt Readings from Last 2 Encounters:  06/17/16 245 lb 12.8 oz (111.5 kg)  05/30/16 247 lb 1.6 oz (112.1 kg)   There is no height or weight on file to calculate BMI. BP Readings from Last 3 Encounters:  06/17/16 112/66  05/30/16 131/70  02/01/16 (!) 144/66    A/P:  DM: patient currently taking canagliflozin ($0 copay) and glimepiride ($20). Discussed glimepiride with PCP and decided to switch to liraglutide (Victoza) through [facility] due to CV  benefits and easier access. Also, will be able to titrate dose of insulin glargine (Lantus). Of note, patient has not yet started Lantus due to cost.  Medication Samples have been provided to the patient.  Drug: insulin glargine (Lantus) Strength: 100 units/mL Qty: 1 LOT: [id number] Exp.Date: Jan 20  Dosing instructions: 10 units daily  The patient has been instructed regarding the correct time, dose, and frequency of taking this medication, including desired effects and most common side effects.   Offered patient the option to finish glimepiride before switching to Victoza. Patient prefers to start Victoza tomorrow, appointment scheduled  Patient states he does have a glucose meter at home. Education provided on home BG monitoring, patient advised to check at least BID  Other cost saving opportunities (approved by PCP): albuterol (Proventil) available through [facility], switch rosuvastatin to atorvastatin. Of note, patient states he has not used the fluticasone inhaler in months. Reports having asthma symptoms/use of albuterol no more than once weekly. Consider step-down and d/c fluticasone for now.  An after visit summary was provided and patient advised to follow up if any changes in condition or questions regarding medications arise.   The patient verbalized understanding of information provided by repeating back concepts discussed.   15 minutes spent face-to-face with the patient during the encounter. 50% of time spent on education. 50% of time was spent on assessment and plan.  Flossie Dibble 11:40 AM 06/17/2016

## 2016-06-17 NOTE — Progress Notes (Signed)
   CC: Cough  HPI:  Thomas Gardner is a 61 y.o. man here today with cough since Thursday now with abdominal pain.   See problem based assessment and plan below for additional details  Past Medical History:  Diagnosis Date  . Arthritis   . Asthma   . CHF (congestive heart failure) ([place])   . Diabetes mellitus 2003  . GERD (gastroesophageal reflux disease)   . Hyperlipidemia   . Hypertension   . Morbid obesity ([place])   . Sleep apnea     Review of Systems:  Review of Systems  HENT: Negative for hearing loss and tinnitus.   Eyes: Negative for blurred vision.  Respiratory: Positive for cough.   Cardiovascular: Positive for chest pain.  Gastrointestinal: Positive for abdominal pain.  Skin: Negative for rash.  Neurological: Negative for dizziness.    Physical Exam: Physical Exam  Constitutional: He is well-developed, well-nourished, and in no distress.  HENT:  Mouth/Throat: Oropharynx is clear and moist. No oropharyngeal exudate.  No tenderness to palpation over sinuses TMs clear bilaterally  Eyes: Conjunctivae are normal.  Cardiovascular: Normal rate and regular rhythm.   Pulmonary/Chest: Effort normal and breath sounds normal.  Abdominal: Soft. He exhibits no distension. There is no guarding.  Tenderness actually located along inferior costal margin across front and sides  Lymphadenopathy:    He has no cervical adenopathy.  Skin: Skin is warm and dry. No rash noted.    Vitals:   06/17/16 1018  BP: 112/66  Pulse: 89  Temp: 98.6 F (37 C)  TempSrc: Oral  SpO2: 99%  Weight: 245 lb 12.8 oz (111.5 kg)  Height: 5\' 6"  (1.676 m)    Assessment & Plan:   See Encounters Tab for problem based charting.  Patient discussed with Dr. Lynnae January

## 2016-06-17 NOTE — Patient Instructions (Signed)
It was a pleasure to see you today Thomas Gardner.  I suspect you have a viral respiratory tract infection that has caused significant inflammation leading to your cough. Your abdominal pain seems to be muscle soreness from this ongoing coughing. I recommend trying Tylenol 650mg  (2 regular strength pills) up to 4 times a day as needed.  For your cough I prescribed a short course of an cough suppressant medicine Tessalon (benzonatate) 100mg  as needed up to 3 times daily. I also prescribed a daily antihistamine Zyrtec (cetirizine) 10mg  once daily that should reduce your congestion.  I suspect this infection will improve on its own with more time. If you are not feeling any better by the end of this week or start having high fevers please call us back since there is a risk of getting a second, bacterial infection.  Please make plans to follow up with Dr. Juleen China or another provider about your diabetes within the next month.

## 2016-06-18 ENCOUNTER — Ambulatory Visit: Payer: Self-pay | Admitting: Pharmacist

## 2016-06-18 NOTE — Progress Notes (Signed)
Internal Medicine Clinic Attending  Case discussed with Dr. Rice at the time of the visit.  We reviewed the resident's history and exam and pertinent patient test results.  I agree with the assessment, diagnosis, and plan of care documented in the resident's note.  

## 2016-06-18 NOTE — Assessment & Plan Note (Signed)
HPI: He is having persistent cough productive of clear and yellow sputum ongoing for 4 days. It is not causing any shortness of breath but now is associated with pain along the bottom of his chest and upper abdomen. He denies associated diarrhea or nausea. He thinks there may be some mild fevers but has not measured these. So far he tried taking some Dayquil for symptoms with mild improvement.  A: Mr. Singerman has a most likely viral upper respiratory tract infection He has no alarming symptoms of a lower airway infection and sinusitis and fever are minimal This is most likely a self limiting infection needing only supportive care He wishes to minimize drowsiness due to medications since he is working  P: -Cetirizine 10mg  once daily for congestion -Tessalon TID PRN for cough -Tylenol 650mg  QID PRN for fevers or body aches -Instructed to call or return to clinic if symptoms are not improving within another 7-10 days

## 2016-06-24 ENCOUNTER — Other Ambulatory Visit: Payer: Self-pay | Admitting: Internal Medicine

## 2016-06-24 DIAGNOSIS — IMO0001 Reserved for inherently not codable concepts without codable children: Secondary | ICD-10-CM

## 2016-06-24 DIAGNOSIS — E1165 Type 2 diabetes mellitus with hyperglycemia: Principal | ICD-10-CM

## 2016-06-25 ENCOUNTER — Telehealth: Payer: Self-pay | Admitting: *Deleted

## 2016-06-25 NOTE — Telephone Encounter (Signed)
Thank you for the update!

## 2016-06-25 NOTE — Telephone Encounter (Signed)
Pt calls and states he is not feeling much better and cough is worse, productive, yellow mucous. Fevers sporadic. Tired, weak appt wed 2/7 at 1545

## 2016-06-26 ENCOUNTER — Ambulatory Visit: Payer: Self-pay

## 2016-06-28 ENCOUNTER — Telehealth: Payer: Self-pay | Admitting: Pharmacist

## 2016-06-28 NOTE — Progress Notes (Addendum)
Medication Samples have been provided to the patient.  Drug: proventil (albuterol) Strength: 90 mcg/puff Qty: 1 LOT: [id number] Exp.Date: 8/19 Dosing instructions: 1-2 puffs every 6 hours as needed for shortness of breath  The patient has been instructed regarding the correct time, dose, and frequency of taking this medication, including desired effects and most common side effects.

## 2016-07-05 ENCOUNTER — Other Ambulatory Visit: Payer: Self-pay | Admitting: Pharmacist

## 2016-07-05 DIAGNOSIS — IMO0001 Reserved for inherently not codable concepts without codable children: Secondary | ICD-10-CM

## 2016-07-05 DIAGNOSIS — E1165 Type 2 diabetes mellitus with hyperglycemia: Principal | ICD-10-CM

## 2016-07-05 MED ORDER — CANAGLIFLOZIN 100 MG PO TABS: 100.0000 mg | ORAL_TABLET | Freq: Every day | ORAL | 2 refills | Status: DC

## 2016-07-11 ENCOUNTER — Ambulatory Visit: Payer: Self-pay | Admitting: Pharmacist

## 2016-07-11 ENCOUNTER — Ambulatory Visit (INDEPENDENT_AMBULATORY_CARE_PROVIDER_SITE_OTHER): Payer: Self-pay | Admitting: Internal Medicine

## 2016-07-11 ENCOUNTER — Encounter: Payer: Self-pay | Admitting: Internal Medicine

## 2016-07-11 ENCOUNTER — Encounter (INDEPENDENT_AMBULATORY_CARE_PROVIDER_SITE_OTHER): Payer: Self-pay

## 2016-07-11 ENCOUNTER — Ambulatory Visit: Payer: Self-pay | Admitting: Dietician

## 2016-07-11 VITALS — BP 106/72 | HR 89 | Temp 98.0°F | Ht 66.0 in | Wt 248.0 lb

## 2016-07-11 DIAGNOSIS — R05 Cough: Secondary | ICD-10-CM

## 2016-07-11 DIAGNOSIS — Z7982 Long term (current) use of aspirin: Secondary | ICD-10-CM

## 2016-07-11 DIAGNOSIS — IMO0001 Reserved for inherently not codable concepts without codable children: Secondary | ICD-10-CM

## 2016-07-11 DIAGNOSIS — Z794 Long term (current) use of insulin: Secondary | ICD-10-CM

## 2016-07-11 DIAGNOSIS — E1165 Type 2 diabetes mellitus with hyperglycemia: Principal | ICD-10-CM

## 2016-07-11 DIAGNOSIS — I10 Essential (primary) hypertension: Secondary | ICD-10-CM

## 2016-07-11 DIAGNOSIS — J069 Acute upper respiratory infection, unspecified: Secondary | ICD-10-CM

## 2016-07-11 DIAGNOSIS — Z79899 Other long term (current) drug therapy: Secondary | ICD-10-CM

## 2016-07-11 DIAGNOSIS — Z8639 Personal history of other endocrine, nutritional and metabolic disease: Secondary | ICD-10-CM

## 2016-07-11 DIAGNOSIS — R252 Cramp and spasm: Secondary | ICD-10-CM | POA: Insufficient documentation

## 2016-07-11 DIAGNOSIS — E785 Hyperlipidemia, unspecified: Secondary | ICD-10-CM

## 2016-07-11 LAB — GLUCOSE, CAPILLARY: Glucose-Capillary: 196 mg/dL — ABNORMAL HIGH (ref 65–99)

## 2016-07-11 MED ORDER — BENZONATATE 100 MG PO CAPS: 100.0000 mg | ORAL_CAPSULE | Freq: Three times a day (TID) | ORAL | 0 refills | Status: DC | PRN

## 2016-07-11 MED ORDER — PRAVASTATIN SODIUM 80 MG PO TABS: 80.0000 mg | ORAL_TABLET | Freq: Every day | ORAL | 3 refills | Status: DC

## 2016-07-11 MED ORDER — EZETIMIBE 10 MG PO TABS: 10.0000 mg | ORAL_TABLET | Freq: Every day | ORAL | 3 refills | Status: DC

## 2016-07-11 NOTE — Patient Instructions (Signed)
Thank you for coming to see me today. It was a pleasure. Today we talked about:   Cough:  - I have refilled your Tessalon pearls - Continue to use your albuterol inhaler up to every 6 hours as needed - Monitor your symptoms and follow up as needed  Diabetes: - for your Lantus, increase the dose by 2 units every 3 days until you are averaging less than 130 in the morning.  Leg Cramps: - we wlll check some blood work today to make sure potassium is okay.  Otherwise, continue to do stretching exercises and get your sugars under control.  Please follow-up with me in 2 months.  If you have any questions or concerns, please do not hesitate to call the office at [phone].  Take Care,   Jule Ser, DO

## 2016-07-11 NOTE — Assessment & Plan Note (Signed)
A: He has been experiencing bilateral leg cramping at night for the past 1-2 weeks that resolves either with eating a banana or some mustard.  Pain is non-exertional and occurs at rest in the evening.  Discussed potential etiology such as electrolyte abnormalities given his hx of hypokalemia vs RLS vs DM neuropathy given his uncontrolled DM.  P: - BMET today to ensure no abnormalities - suggested daily stretching activities - discussed importance of glycemic control

## 2016-07-11 NOTE — Progress Notes (Signed)
CC: here for DM and HLD f/u and complaint of leg cramping.  HPI:  Thomas Gardner is a 61 y.o. man with a past medical history listed below here today for follow up of his DM, HLD and complaint of leg cramping.   For details of today's visit and the status of his chronic medical issues please refer to the assessment and plan.   Past Medical History:  Diagnosis Date  . Arthritis   . Asthma   . CHF (congestive heart failure) ([place])   . Diabetes mellitus 2003  . GERD (gastroesophageal reflux disease)   . Hyperlipidemia   . Hypertension   . Morbid obesity ([place])   . Sleep apnea     Review of Systems:  Please see pertinent ROS reviewed in HPI and problem based charting.   Physical Exam:  Vitals:   07/11/16 1346  BP: 106/72  Pulse: 89  Temp: 98 F (36.7 C)  TempSrc: Oral  SpO2: 100%  Weight: 248 lb (112.5 kg)  Height: 5\' 6"  (1.676 m)   Physical Exam  Constitutional: He is oriented to person, place, and time and well-developed, well-nourished, and in no distress.  Cardiovascular: Normal rate and regular rhythm.   Pulmonary/Chest: Effort normal and breath sounds normal. He has no wheezes.  Musculoskeletal: He exhibits no edema.  Neurological: He is alert and oriented to person, place, and time.  Psychiatric: Mood and affect normal.     Assessment & Plan:   See Encounters Tab for problem based charting.  Patient discussed with Dr. Eppie Gibson.  Essential hypertension BP Readings from Last 3 Encounters:  07/11/16 106/72  06/17/16 112/66  05/30/16 131/70   A: BP well-controlled on current regimen of coreg 25mg  BID, chlorthalidone 25mg  daily, and losartan 50mg  daily.  No symptoms such as headaches, lightheadedness, blurry vision, or chest pain.  P:  - continue current medications - BMET today to assess renal function, potassium given his complaints of leg cramps  Acute URI A: He continues to have a persistent try cough and feeling of "having dust" in my lungs.  He  did well with Tessalon pearls as needed last month and has only used his albuterol inhaler on 2 occasions during this time.  He has no systemic symptoms of fever, chills, and his cough is non-productive.  Suspect a resolving viral URI with residual bronchial irritation.  P: - refilled his Tessalon pearls TID PRN, #30 tablets, no refills - recommended continued Albuterol as needed and that he can use more frequently if needed - RTC as needed  Diabetes mellitus type 2, uncontrolled, without complications ([place]) A: With the assistance of our clinic pharmacist, we have changed his DM regimen to be more cost friendly and to, hopefully, ensure better adherence.  We discussed an A1c goal of < 7 being ideal given his age and other comorbidities.  He has been checking his sugars about 2 times per day and recalls his AM readings being in the 195-230 range now that he is consistently taking Lantus.  He did not bring meter today.  P:  - continue Lantus, have instructed him to increase dose by 2 units every 3-4 days until average AM reading is 150 or less.  He expressed understanding in being able to do so. - continue liraglutide 1.2mg  daily - continue canagliflozin 100mg  daily - RTC 2 months to repeat A1c  Hyperlipidemia A: His 10-year ASCVD risk is 31% based on risk factors.  He is unable to afford Crestor.  He  does not want to take Lipitor due to previous adverse reaction.  He is agreeable to taking Pravasatin plus Zetia.  P:  - start pravastatin 80mg  daily and zetia 10mg  daily - continue ASA 81 - consider repeat lipid panel in 2 months to assess response and/or adherence when he follows up for his DM  Leg cramping A: He has been experiencing bilateral leg cramping at night for the past 1-2 weeks that resolves either with eating a banana or some mustard.  Pain is non-exertional and occurs at rest in the evening.  Discussed potential etiology such as electrolyte abnormalities given his hx of  hypokalemia vs RLS vs DM neuropathy given his uncontrolled DM.  P: - BMET today to ensure no abnormalities - suggested daily stretching activities - discussed importance of glycemic control

## 2016-07-11 NOTE — Progress Notes (Signed)
Case discussed with Dr. Wallace soon after the resident saw the patient. We reviewed the resident's history and exam and pertinent patient test results. I agree with the assessment, diagnosis, and plan of care documented in the resident's note. 

## 2016-07-11 NOTE — Assessment & Plan Note (Signed)
BP Readings from Last 3 Encounters:  07/11/16 106/72  06/17/16 112/66  05/30/16 131/70   A: BP well-controlled on current regimen of coreg 25mg  BID, chlorthalidone 25mg  daily, and losartan 50mg  daily.  No symptoms such as headaches, lightheadedness, blurry vision, or chest pain.  P:  - continue current medications - BMET today to assess renal function, potassium given his complaints of leg cramps

## 2016-07-11 NOTE — Assessment & Plan Note (Signed)
A: With the assistance of our clinic pharmacist, we have changed his DM regimen to be more cost friendly and to, hopefully, ensure better adherence.  We discussed an A1c goal of < 7 being ideal given his age and other comorbidities.  He has been checking his sugars about 2 times per day and recalls his AM readings being in the 195-230 range now that he is consistently taking Lantus.  He did not bring meter today.  P:  - continue Lantus, have instructed him to increase dose by 2 units every 3-4 days until average AM reading is 150 or less.  He expressed understanding in being able to do so. - continue liraglutide 1.2mg  daily - continue canagliflozin 100mg  daily - RTC 2 months to repeat A1c

## 2016-07-11 NOTE — Assessment & Plan Note (Signed)
A: He continues to have a persistent try cough and feeling of "having dust" in my lungs.  He did well with Tessalon pearls as needed last month and has only used his albuterol inhaler on 2 occasions during this time.  He has no systemic symptoms of fever, chills, and his cough is non-productive.  Suspect a resolving viral URI with residual bronchial irritation.  P: - refilled his Tessalon pearls TID PRN, #30 tablets, no refills - recommended continued Albuterol as needed and that he can use more frequently if needed - RTC as needed

## 2016-07-11 NOTE — Assessment & Plan Note (Addendum)
A: His 10-year ASCVD risk is 31% based on risk factors.  He is unable to afford Crestor.  He does not want to take Lipitor due to previous adverse reaction.  He is agreeable to taking Pravasatin plus Zetia.  P:  - start pravastatin 80mg  daily and zetia 10mg  daily - continue ASA 81 for primary prevention - consider repeat lipid panel in 2 months to assess response and/or adherence when he follows up for his DM

## 2016-07-12 LAB — BMP8+ANION GAP
Anion Gap: 18 mmol/L (ref 10.0–18.0)
BUN / CREAT RATIO: 15 (ref 10–24)
BUN: 17 mg/dL (ref 8–27)
CO2: 26 mmol/L (ref 18–29)
Calcium: 9.3 mg/dL (ref 8.6–10.2)
Chloride: 95 mmol/L — ABNORMAL LOW (ref 96–106)
Creatinine, Ser: 1.13 mg/dL (ref 0.76–1.27)
GFR, EST AFRICAN AMERICAN: 81 mL/min/{1.73_m2} (ref >59)
GFR, EST NON AFRICAN AMERICAN: 70 mL/min/{1.73_m2} (ref >59)
Glucose: 203 mg/dL — ABNORMAL HIGH (ref 65–99)
POTASSIUM: 3.5 mmol/L (ref 3.5–5.2)
SODIUM: 139 mmol/L (ref 134–144)

## 2016-07-12 NOTE — Progress Notes (Signed)
S: Thomas Gardner is a 61 y.o. male reports to clinical pharmacist appointment for help with medications.   Allergies  Allergen Reactions  . Erythromycin Nausea And Vomiting   Medication Sig  albuterol (PROVENTIL HFA;VENTOLIN HFA) 108 (90 Base) MCG/ACT inhaler Inhale 2 puffs into the lungs every 6 (six) hours as needed for wheezing or shortness of breath. [facility]  aspirin EC 81 MG tablet Take 81 mg by mouth daily.  benzonatate (TESSALON) 100 MG capsule Take 1 capsule (100 mg total) by mouth 3 (three) times daily as needed for cough.  canagliflozin (INVOKANA) 100 MG TABS tablet Take 1 tablet (100 mg total) by mouth daily. [facility]  carvedilol (COREG) 25 MG tablet TAKE ONE TABLET BY MOUTH TWICE DAILY WITH A MEAL  cetirizine (ZYRTEC) 10 MG tablet Take 1 tablet (10 mg total) by mouth daily.  chlorthalidone (HYGROTON) 25 MG tablet Take 1 tablet (25 mg total) by mouth daily.  ezetimibe (ZETIA) 10 MG tablet Take 1 tablet (10 mg total) by mouth daily. [facility]  Insulin Glargine (LANTUS) 100 UNIT/ML Solostar Pen Inject 10 Units into the skin daily at 10 pm. [facility]  Insulin Pen Needle (B-D UF III MINI PEN NEEDLES) 31G X 5 MM MISC Use to inject lantus daily  liraglutide (VICTOZA) 18 MG/3ML SOPN Inject 0.2 mLs (1.2 mg total) into the skin daily.  losartan (COZAAR) 50 MG tablet TAKE ONE TABLET BY MOUTH ONCE DAILY  pantoprazole (PROTONIX) 40 MG tablet Take 1 tablet (40 mg total) by mouth daily.  pravastatin (PRAVACHOL) 80 MG tablet Take 1 tablet (80 mg total) by mouth daily.   Past Medical History:  Diagnosis Date  . Arthritis   . Asthma   . CHF (congestive heart failure) ([place])   . Diabetes mellitus 2003  . GERD (gastroesophageal reflux disease)   . Hyperlipidemia   . Hypertension   . Morbid obesity ([place])   . Sleep apnea    Social History   Social History  . Marital status: Single    Spouse name: N/A  . Number of children: N/A  . Years of education: N/A   Occupational  History  . Beautician    Social History Main Topics  . Smoking status: Never Smoker  . Smokeless tobacco: Not on file  . Alcohol use No  . Drug use: No  . Sexual activity: Not on file   Other Topics Concern  . Not on file   Social History Narrative  . No narrative on file   Family History  Problem Relation Age of Onset  . Cancer Mother     ovarian  . Diabetes Mother   . Stroke Father   . Cancer Maternal Aunt     breast  . Diabetes Maternal Aunt   . Cancer Maternal Uncle     colon   O: Component Value Date/Time   CHOL 320 (H) 01/18/2016 1613   HDL 34 (L) 01/18/2016 1613   TRIG 304 (H) 01/18/2016 1613   AST 16 08/08/2014 0835   ALT 15 08/08/2014 0835   NA 139 07/11/2016 1424   K 3.5 07/11/2016 1424   CL 95 (L) 07/11/2016 1424   CO2 26 07/11/2016 1424   GLUCOSE 203 (H) 07/11/2016 1424   GLUCOSE 265 (H) 11/27/2015 1700   HGBA1C 12.4 05/30/2016 1405   HGBA1C 6.1 10/24/2014 0851   BUN 17 07/11/2016 1424   CREATININE 1.13 07/11/2016 1424   CREATININE 1.06 10/24/2014 0907   CALCIUM 9.3 07/11/2016 1424  GFRAA 81 07/11/2016 1424   GFRAA 88 10/24/2014 0907   WBC 15.0 (H) 11/27/2015 1700   HGB 15.4 11/27/2015 1700   HCT 45.4 11/27/2015 1700   PLT 182 11/27/2015 1700   TSH 1.130 10/28/2011 1050   Ht Readings from Last 2 Encounters:  07/11/16 5\' 6"  (1.676 m)  06/17/16 5\' 6"  (1.676 m)   Wt Readings from Last 2 Encounters:  07/11/16 248 lb (112.5 kg)  06/17/16 245 lb 12.8 oz (111.5 kg)   There is no height or weight on file to calculate BMI. BP Readings from Last 3 Encounters:  07/11/16 106/72  06/17/16 112/66  05/30/16 131/70   A/P:  Physician pharmacist co-visit, working with team to help with lipid and DM management  Patient has a history of intolerance to atorvastatin and is unable to afford/access rosuvastatin. Switch to pravastatin with ezetimibe for now with plan to switch back to high-intensity statin therapy in the future once patient is able to  afford.  Referred patient to [facility] for liraglutide (Victoza), insulin glargine (Lantus), canagliflozin (Invokana), and ezetimibe (Zetia)  Medication Samples have been provided to the patient.  Drug: liraglutide Strength: 18 mg/63mL Qty: 1 LOT: [id number] Exp.Date: Jan 2019  Dosing instructions: 1.2 mg daily  The patient has been instructed regarding the correct time, dose, and frequency of taking this medication, including desired effects and most common side effects.   An after visit summary was provided and patient advised to follow up if any changes in condition or questions regarding medications arise.   The patient verbalized understanding of information provided by repeating back concepts discussed.

## 2016-07-14 ENCOUNTER — Other Ambulatory Visit: Payer: Self-pay | Admitting: Internal Medicine

## 2016-07-15 ENCOUNTER — Other Ambulatory Visit: Payer: Self-pay | Admitting: *Deleted

## 2016-07-22 ENCOUNTER — Telehealth: Payer: Self-pay | Admitting: Internal Medicine

## 2016-07-22 NOTE — Telephone Encounter (Signed)
APT. REMINDER CALL, LMTCB °

## 2016-07-23 ENCOUNTER — Ambulatory Visit: Payer: Self-pay

## 2016-07-23 ENCOUNTER — Ambulatory Visit (INDEPENDENT_AMBULATORY_CARE_PROVIDER_SITE_OTHER): Payer: Self-pay | Admitting: Dietician

## 2016-07-23 DIAGNOSIS — E1165 Type 2 diabetes mellitus with hyperglycemia: Secondary | ICD-10-CM

## 2016-07-23 DIAGNOSIS — Z713 Dietary counseling and surveillance: Secondary | ICD-10-CM

## 2016-07-23 DIAGNOSIS — IMO0001 Reserved for inherently not codable concepts without codable children: Secondary | ICD-10-CM

## 2016-07-23 DIAGNOSIS — Z794 Long term (current) use of insulin: Secondary | ICD-10-CM

## 2016-07-23 NOTE — Patient Instructions (Signed)
Medications- learn what the ones I am taking how they work  Sleep- work on sleeping better- camomile tea  Food- try to incorporate foods I need..   Eat more... 1. Whole grains: whole wheat cereal, crackers, bread, pasta, old fashioned oats & brown rice 2. Whole fruits-1 cup a day 3. Vegetables- 2-3 cups a day 4. Lowfat Protein: Chicken, [place], lean cuts of beef and pork, fish 2-3 times a week 5. Nuts, Seeds and Soy:add walnuts to cereal, peanut butter sandwich  Eat Less... 1. Saturated & Transfats- mostly from red meat, high fat dairy like milk, ice cream, coffee creamer, snack foods like chips, pork rind, fried foods 2. Added Sugar: artifical sweeteners can help 3. Sodium:Use spices instead of salt, avoid salty foods like soup, crackers, chips, lunch meat, sausage, hot dogs  Avoid.... . High-fructose corn syrup and "other sugars" . Partially hydrogenated vegetable oils . Trans fatty acids . Nondairy coffee creamer.  Of course, avoid  too much of almost any food.   Call for a follow up.   [phone]

## 2016-07-23 NOTE — Progress Notes (Signed)
Diabetes Self-Management Education  Visit Type: First/Initial  Appt. Start Time: 1100 Appt. End Time: 1200  07/23/2016  Mr. Roxy Manns, identified by name and date of birth, is a 61 y.o. male with a diagnosis of Diabetes: Type 2.   ASSESSMENT  There were no vitals taken for this visit. There is no height or weight on file to calculate BMI.      Diabetes Self-Management Education - 07/23/16 1300      Visit Information   Visit Type First/Initial     Initial Visit   Diabetes Type Type 2   Are you currently following a meal plan? Yes   What type of meal plan do you follow? trying not to eat too many fried foods or large portions and less sugar   Are you taking your medications as prescribed? Yes     Psychosocial Assessment   Patient Belief/Attitude about Diabetes Motivated to manage diabetes   Self-care barriers Lack of material resources;Other (comment)   Self-management support Doctor's office;CDE visits   Patient Concerns Nutrition/Meal planning;Problem Solving   Special Needs None   Preferred Learning Style No preference indicated   Learning Readiness Ready     Pre-Education Assessment   Patient understands the diabetes disease and treatment process. Needs Review   Patient understands incorporating nutritional management into lifestyle. Needs Review   Patient understands using medications safely. Needs Review   Patient understands monitoring blood glucose, interpreting and using results Needs Review   Patient understands prevention, detection, and treatment of acute complications. Demonstrates understanding / competency   Patient understands how to develop strategies to address psychosocial issues. Needs Review   Patient understands how to develop strategies to promote health/change behavior. Needs Review     Complications   Last HgB A1C per patient/outside source 12 %   How often do you check your blood sugar? 1-2 times/day   Fasting Blood glucose range (mg/dL)  180-200  this is where he prefers his blood sugars to be   Postprandial Blood glucose range (mg/dL) >200   Number of hypoglycemic episodes per month 0   Number of hyperglycemic episodes per week 100   Can you tell when your blood sugar is high? Yes   What do you do if your blood sugar is high? takes medicine   Have you had a dilated eye exam in the past 12 months? Yes     Dietary Intake   Breakfast 10 am 1/2 sausage [place] bacon or sandwich   Lunch 1 Pm heaviest meal- sandwich or meat & vegtabel and starach at [employer], baked sphagetti and greens  or fried fish, mashed potatoe and broccoli   Snack (afternoon) 4-5 Pm chicken wingsgns   Dinner 7 Pm 1/2 chicken salad sandwich   Snack (evening) 9-10 Pm if hungry has raisin bran and almond milk   Beverage(s) wter, ginger ale 1/2 cup now and then, lemonade sometimes, usd to drink sweet tea, but is trying to stop     Exercise   Exercise Type ADL's     Patient Education   Previous Diabetes Education Yes (please comment)  at a previous doctor's office   Nutrition management  Role of diet in the treatment of diabetes and the relationship between the three main macronutrients and blood glucose level;Meal timing in regards to the patients' current diabetes medication.   Medications Reviewed patients medication for diabetes, action, purpose, timing of dose and side effects.   Monitoring Purpose and frequency of SMBG.  Individualized Goals (developed by patient)   Medications Other (comment)  learn more about the medicine I am taking   Health Coping ask for help with (comment)  he is considering calling [facility] for grief counselin     Outcomes   Expected Outcomes Demonstrated interest in learning. Expect positive outcomes   Future DMSE 4-6 wks   Program Status Not Completed      Individualized Plan for Diabetes Self-Management Training:   Learning Objective:  Patient will have a greater understanding of diabetes  self-management. Patient education plan is to attend individual and/or group sessions per assessed needs and concerns.   Plan:   Patient Instructions  Medications- learn what the ones I am taking how they work  Sleep- work on sleeping better- camomile tea  Food- try to incorporate foods I need..   Eat more... 1. Whole grains: whole wheat cereal, crackers, bread, pasta, old fashioned oats & brown rice 2. Whole fruits-1 cup a day 3. Vegetables- 2-3 cups a day 4. Lowfat Protein: Chicken, [place], lean cuts of beef and pork, fish 2-3 times a week 5. Nuts, Seeds and Soy:add walnuts to cereal, peanut butter sandwich  Eat Less... 1. Saturated & Transfats- mostly from red meat, high fat dairy like milk, ice cream, coffee creamer, snack foods like chips, pork rind, fried foods 2. Added Sugar: artifical sweeteners can help 3. Sodium:Use spices instead of salt, avoid salty foods like soup, crackers, chips, lunch meat, sausage, hot dogs  Avoid.... . High-fructose corn syrup and "other sugars" . Partially hydrogenated vegetable oils . Trans fatty acids . Nondairy coffee creamer.  Of course, avoid  too much of almost any food.   Call for a follow up.   [phone]    Expected Outcomes:  Demonstrated interest in learning. Expect positive outcomes  Education material provided: about medicine, AVS  If problems or questions, patient to contact team via:  Phone  Future DSME appointment: 4-6 wks

## 2016-07-29 ENCOUNTER — Other Ambulatory Visit: Payer: Self-pay | Admitting: Student in an Organized Health Care Education/Training Program

## 2016-07-29 DIAGNOSIS — IMO0001 Reserved for inherently not codable concepts without codable children: Secondary | ICD-10-CM

## 2016-07-29 DIAGNOSIS — I10 Essential (primary) hypertension: Secondary | ICD-10-CM

## 2016-07-29 DIAGNOSIS — E1165 Type 2 diabetes mellitus with hyperglycemia: Principal | ICD-10-CM

## 2016-07-30 ENCOUNTER — Encounter (HOSPITAL_BASED_OUTPATIENT_CLINIC_OR_DEPARTMENT_OTHER): Payer: Self-pay | Admitting: *Deleted

## 2016-07-30 ENCOUNTER — Emergency Department (HOSPITAL_BASED_OUTPATIENT_CLINIC_OR_DEPARTMENT_OTHER): Payer: Worker's Compensation

## 2016-07-30 ENCOUNTER — Emergency Department (HOSPITAL_BASED_OUTPATIENT_CLINIC_OR_DEPARTMENT_OTHER)
Admission: EM | Admit: 2016-07-30 | Discharge: 2016-07-30 | Disposition: A | Discharge status: Home / Self Care | Payer: Worker's Compensation | Attending: Emergency Medicine | Admitting: Emergency Medicine

## 2016-07-30 DIAGNOSIS — M545 Low back pain: Secondary | ICD-10-CM | POA: Insufficient documentation

## 2016-07-30 DIAGNOSIS — J45909 Unspecified asthma, uncomplicated: Secondary | ICD-10-CM | POA: Diagnosis not present

## 2016-07-30 DIAGNOSIS — W19XXXA Unspecified fall, initial encounter: Secondary | ICD-10-CM

## 2016-07-30 DIAGNOSIS — W01198A Fall on same level from slipping, tripping and stumbling with subsequent striking against other object, initial encounter: Secondary | ICD-10-CM | POA: Diagnosis not present

## 2016-07-30 DIAGNOSIS — Y929 Unspecified place or not applicable: Secondary | ICD-10-CM | POA: Insufficient documentation

## 2016-07-30 DIAGNOSIS — Y939 Activity, unspecified: Secondary | ICD-10-CM | POA: Diagnosis not present

## 2016-07-30 DIAGNOSIS — Z79899 Other long term (current) drug therapy: Secondary | ICD-10-CM | POA: Insufficient documentation

## 2016-07-30 DIAGNOSIS — Y999 Unspecified external cause status: Secondary | ICD-10-CM | POA: Insufficient documentation

## 2016-07-30 DIAGNOSIS — R0781 Pleurodynia: Secondary | ICD-10-CM | POA: Insufficient documentation

## 2016-07-30 DIAGNOSIS — Z794 Long term (current) use of insulin: Secondary | ICD-10-CM | POA: Diagnosis not present

## 2016-07-30 DIAGNOSIS — Z7982 Long term (current) use of aspirin: Secondary | ICD-10-CM | POA: Insufficient documentation

## 2016-07-30 DIAGNOSIS — I509 Heart failure, unspecified: Secondary | ICD-10-CM | POA: Diagnosis not present

## 2016-07-30 DIAGNOSIS — E119 Type 2 diabetes mellitus without complications: Secondary | ICD-10-CM | POA: Insufficient documentation

## 2016-07-30 DIAGNOSIS — I110 Hypertensive heart disease with heart failure: Secondary | ICD-10-CM | POA: Insufficient documentation

## 2016-07-30 DIAGNOSIS — S299XXA Unspecified injury of thorax, initial encounter: Secondary | ICD-10-CM | POA: Diagnosis present

## 2016-07-30 MED ORDER — ACETAMINOPHEN 500 MG PO TABS
1000.0000 mg | ORAL_TABLET | Freq: Once | ORAL | Status: AC
  Administered 2016-07-30: 1000 mg via ORAL
  Filled 2016-07-30: qty 2

## 2016-07-30 NOTE — ED Notes (Signed)
Pt reports speaking to a supervisor at his place of employment and that no alcohol or drug tests are needed.

## 2016-07-30 NOTE — ED Triage Notes (Signed)
Pt reports this past Saturday he tripped and fell onto concrete. Reports landing on R side. States he hit his head as well but no LOC. Denies n/v. Presents today with lower back and R side pain, as well as bil. Knee pain. Pt able to ambulate. Denies genitourinary symptoms.

## 2016-07-30 NOTE — ED Provider Notes (Addendum)
[facility]PT [facility] Provider Note   CSN: [id number] Arrival date & time: 07/30/16  1229     History   Chief Complaint Chief Complaint  Patient presents with  . Fall    HPI Thomas Gardner is a 61 y.o. male.Patient tripped and fell 3 days ago while work when his foot got tangled in a wire. He complains of low back pain, slight bilateral knee pain, and rib pain since the event. He states he struck his head but has no headache and no neck pain did not lose consciousness. He braced himself for the fall. He's been treating himself with Tylenol with partial relief. No other associated symptoms. Pain is worse with movement improved with remaining still. Denies shortness of breath denies lightheadedness denies other associated symptoms  HPI  Past Medical History:  Diagnosis Date  . Arthritis   . Asthma   . CHF (congestive heart failure) ([place])   . Diabetes mellitus 2003  . GERD (gastroesophageal reflux disease)   . Hyperlipidemia   . Hypertension   . Morbid obesity ([place])   . Sleep apnea     Patient Active Problem List   Diagnosis Date Noted  . Leg cramping 07/11/2016  . Acute URI 06/17/2016  . Sleep apnea 02/01/2016  . Coccyalgia 01/19/2016  . Depressed mood 01/19/2016  . Scrotal swelling 12/04/2015  . Hypokalemia 11/02/2015  . 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 Healthcare maintenance 03/17/2015  . Gout 08/08/2014  . Hyperlipidemia 06/15/2014  . GI bleed 05/03/2014  . Congestive heart failure ([place]) 05/03/2014  . Diabetes mellitus type 2, uncontrolled, without complications ([place]) 10/93/2355  . Essential hypertension 05/03/2014  . Morbid obesity ([place]) 10/10/2011    Past Surgical History:  Procedure Laterality Date  . BREATH TEK H PYLORI  11/11/2011   Procedure: BREATH TEK H PYLORI;  Surgeon: Shann Medal, MD;  Location: [facility];  Service: General;  Laterality: N/A;  . CARDIAC CATHETERIZATION    . ESOPHAGOGASTRODUODENOSCOPY (EGD) WITH PROPOFOL N/A  05/04/2014   Procedure: ESOPHAGOGASTRODUODENOSCOPY (EGD) WITH PROPOFOL;  Surgeon: Cleotis Nipper, MD;  Location: [facility];  Service: Endoscopy;  Laterality: N/A;       Home Medications    Prior to Admission medications   Medication Sig Start Date End Date Taking? Authorizing Provider  albuterol (PROVENTIL HFA;VENTOLIN HFA) 108 (90 Base) MCG/ACT inhaler Inhale 2 puffs into the lungs every 6 (six) hours as needed for wheezing or shortness of breath. [facility] 06/17/16  Yes Jule Ser, DO  benzonatate (TESSALON) 100 MG capsule Take 1 capsule (100 mg total) by mouth 3 (three) times daily as needed for cough. 07/11/16  Yes Jule Ser, DO  canagliflozin (INVOKANA) 100 MG TABS tablet Take 1 tablet (100 mg total) by mouth daily. [facility] 07/05/16  Yes Jule Ser, DO  carvedilol (COREG) 25 MG tablet TAKE ONE TABLET BY MOUTH TWICE DAILY WITH A MEAL 07/16/16  Yes Jule Ser, DO  cetirizine (ZYRTEC) 10 MG tablet Take 1 tablet (10 mg total) by mouth daily. 06/17/16  Yes Collier Salina, MD  chlorthalidone (HYGROTON) 25 MG tablet Take 1 tablet (25 mg total) by mouth daily. 04/25/16  Yes Bartholomew Crews, MD  Insulin Glargine (LANTUS) 100 UNIT/ML Solostar Pen Inject 10 Units into the skin daily at 10 pm. [facility] 06/17/16  Yes Jule Ser, DO  Insulin Pen Needle (B-D UF III MINI PEN NEEDLES) 31G X 5 MM MISC Use to inject lantus daily 11/02/15  Yes Dellia Nims, MD  liraglutide (VICTOZA) 18 MG/3ML SOPN Inject 0.2 mLs (1.2 mg total) into the skin daily. 06/17/16  Yes Jule Ser, DO  losartan (COZAAR) 50 MG tablet TAKE ONE TABLET BY MOUTH ONCE DAILY 04/22/16  Yes Axel Filler, MD  pantoprazole (PROTONIX) 40 MG tablet Take 1 tablet (40 mg total) by mouth daily. 02/01/16 01/31/17 Yes Jule Ser, DO  aspirin EC 81 MG tablet Take 81 mg by mouth daily.    Historical Provider, MD  ezetimibe (ZETIA) 10 MG tablet Take 1 tablet (10 mg total) by mouth daily. [facility]  07/11/16   Jule Ser, DO  pravastatin (PRAVACHOL) 80 MG tablet Take 1 tablet (80 mg total) by mouth daily. 07/11/16   Jule Ser, DO    Family History Family History  Problem Relation Age of Onset  . Cancer Mother     ovarian  . Diabetes Mother   . Stroke Father   . Cancer Maternal Aunt     breast  . Diabetes Maternal Aunt   . Cancer Maternal Uncle     colon    Social History Social History  Substance Use Topics  . Smoking status: Never Smoker  . Smokeless tobacco: Never Used  . Alcohol use No     Allergies   Erythromycin   Review of Systems Review of Systems  Constitutional: Negative.   HENT: Negative.   Respiratory: Negative.  Negative for shortness of breath.   Cardiovascular: Negative.   Gastrointestinal: Negative.   Musculoskeletal: Positive for arthralgias and back pain.  Skin: Negative.   Neurological: Negative.   Psychiatric/Behavioral: Negative.   All other systems reviewed and are negative.    Physical Exam Updated Vital Signs BP 111/67 (BP Location: Right Arm)   Pulse 95   Temp 98.5 F (36.9 C) (Oral)   Resp 18   Ht 5\' 6"  (1.676 m)   Wt 245 lb (111.1 kg)   SpO2 96%   BMI 39.54 kg/m   Physical Exam  Constitutional: He is oriented to person, place, and time. He appears well-developed and well-nourished. No distress.  Alert Glasgow Coma Score 15  HENT:  Head: Normocephalic and atraumatic.  Eyes: Conjunctivae are normal. Pupils are equal, round, and reactive to light.  Neck: Neck supple. No tracheal deviation present. No thyromegaly present.  Cardiovascular: Normal rate and regular rhythm.   No murmur heard. Pulmonary/Chest: Effort normal and breath sounds normal.  Chest is tender at right ribs laterally. No ecchymosis  Abdominal: Soft. Bowel sounds are normal. He exhibits no distension. There is no tenderness.  Musculoskeletal: Normal range of motion. He exhibits no edema or tenderness.  Cervical spine nontender thoracic spine  nontender there is left-sided paralumbar tenderness and he is tender over lumbar spine. Pelvis stable nontender. All 4 extremities without contusion abrasion or tenderness neurovascular intact  Neurological: He is alert and oriented to person, place, and time. No cranial nerve deficit. Coordination normal.  Gait normal motor strength 5 over 5 overall  Skin: Skin is warm and dry. No rash noted.  Psychiatric: He has a normal mood and affect.  Nursing note and vitals reviewed.   X-rays viewed by me Results for orders placed or performed in visit on 07/11/16  BMP8+Anion Gap  Result Value Ref Range   Glucose 203 (H) 65 - 99 mg/dL   BUN 17 8 - 27 mg/dL   Creatinine, Ser 1.13 0.76 - 1.27 mg/dL   GFR calc non Af Amer 70 >59 mL/min/1.73   GFR calc Af Amer 81 >  59 mL/min/1.73   BUN/Creatinine Ratio 15 10 - 24   Sodium 139 134 - 144 mmol/L   Potassium 3.5 3.5 - 5.2 mmol/L   Chloride 95 (L) 96 - 106 mmol/L   CO2 26 18 - 29 mmol/L   Anion Gap 18.0 10.0 - 18.0 mmol/L   Calcium 9.3 8.6 - 10.2 mg/dL   Dg Ribs Unilateral W/chest Right  Result Date: 07/30/2016 CLINICAL DATA:  Patient fell 3 days ago.  Posterior RIGHT rib pain EXAM: RIGHT RIBS AND CHEST - 3+ VIEW COMPARISON:  Radiograph 04/06/2012 FINDINGS: Normal mediastinum and cardiac silhouette. Normal pulmonary vasculature. No evidence of effusion, infiltrate, or pneumothorax. No acute bony abnormality. Dedicated views of the RIGHT ribs demonstrate no displaced fracture. IMPRESSION: 1. No evidence of rib fracture. 2. No acute cardiopulmonary findings. Electronically Signed   By: Suzy Bouchard M.D.   On: 07/30/2016 13:51   Dg Lumbar Spine Complete  Result Date: 07/30/2016 CLINICAL DATA:  [place] at work 3 days ago.  Pain. EXAM: LUMBAR SPINE - COMPLETE 4+ VIEW COMPARISON:  None. FINDINGS: There is no evidence of lumbar spine fracture. Alignment is normal. Intervertebral disc spaces are maintained. IMPRESSION: Negative. Electronically Signed   By: Misty Stanley M.D.   On: 07/30/2016 13:49   ED Treatments / Results  Labs (all labs ordered are listed, but only abnormal results are displayed) Labs Reviewed - No data to display  EKG  EKG Interpretation None      2:35 PM resting comfortably. Appears in no distress after treatment with Tylenol. Radiology No results found.  Procedures Procedures (including critical care time)  Medications Ordered in ED Medications  acetaminophen (TYLENOL) tablet 1,000 mg (not administered)     Initial Impression / Assessment and Plan / ED Course  I have reviewed the triage vital signs and the nursing notes.  Pertinent labs & imaging results that were available during my care of the patient were reviewed by me and considered in my medical decision making (see chart for details).     X-rays of knees not indicated. Discussed with patient who agrees Plan Tylenol for pain. Follow-up with PMD if not better in a week. I'll write him a note saying that he is allowed to sit while at work for one week. Final Clinical Impressions(s) / ED Diagnoses  Diagnosis #1 fall  #2 2 since of multiple sites Final diagnoses:  None    New Prescriptions New Prescriptions   No medications on file     Orlie Dakin, MD 07/30/16 [place], MD 07/30/16 1440

## 2016-07-30 NOTE — Discharge Instructions (Signed)
Take Tylenol for pain as directed every 4 hours if needed. See your primary care physician having significant pain in a week

## 2016-08-06 ENCOUNTER — Telehealth: Payer: Self-pay | Admitting: Internal Medicine

## 2016-08-06 NOTE — Telephone Encounter (Signed)
APT. REMINDER CALL, LMTCB °

## 2016-08-07 ENCOUNTER — Ambulatory Visit (INDEPENDENT_AMBULATORY_CARE_PROVIDER_SITE_OTHER): Payer: Self-pay | Admitting: Internal Medicine

## 2016-08-07 ENCOUNTER — Encounter: Payer: Self-pay | Admitting: Internal Medicine

## 2016-08-07 VITALS — BP 131/67 | HR 90 | Temp 97.7°F | Ht 66.0 in | Wt 245.3 lb

## 2016-08-07 DIAGNOSIS — R0781 Pleurodynia: Secondary | ICD-10-CM

## 2016-08-07 DIAGNOSIS — M62830 Muscle spasm of back: Secondary | ICD-10-CM

## 2016-08-07 DIAGNOSIS — M545 Low back pain: Secondary | ICD-10-CM

## 2016-08-07 DIAGNOSIS — M5387 Other specified dorsopathies, lumbosacral region: Secondary | ICD-10-CM | POA: Insufficient documentation

## 2016-08-07 DIAGNOSIS — IMO0001 Reserved for inherently not codable concepts without codable children: Secondary | ICD-10-CM

## 2016-08-07 DIAGNOSIS — E1165 Type 2 diabetes mellitus with hyperglycemia: Secondary | ICD-10-CM

## 2016-08-07 DIAGNOSIS — Z794 Long term (current) use of insulin: Secondary | ICD-10-CM

## 2016-08-07 MED ORDER — METHOCARBAMOL 500 MG PO TABS: 500.0000 mg | ORAL_TABLET | Freq: Two times a day (BID) | ORAL | 0 refills | Status: DC

## 2016-08-07 MED ORDER — DICLOFENAC SODIUM 1 % TD GEL: 4.0000 g | Freq: Four times a day (QID) | TRANSDERMAL | 0 refills | Status: DC

## 2016-08-07 NOTE — Patient Instructions (Addendum)
For your rib/back pain: --apply voltaren gel up to four times a day --can take up to 4000mg  of tylenol in a 24 hour period --use ice and heat as tolerated --can take robaxin up to 2 times a day if it helps   We provided you with victoza and lantus; I have given you enough lantus so you are able to adjust the dosing to get your morning sugars to 150 or less. Please set up an appointment to see Dr. Juleen China in May to continue managing your diabetes.

## 2016-08-07 NOTE — Progress Notes (Signed)
   CC: fall with back pain  HPI:  [name] is a 61 y.o. with a PMH listed below presenting to clinic for follow up from ED for a fall obtained at work.  Patient fell at work about a week and a half ago after tripping on wire. He presented to the ED 3 days after with low back pain, bil knee pain and right rib pain. Lumbar xray at the time was negative for fracture, misalignment; right rib xray was negative for fractures. No imaging was thought to be necessary for his knee pain. Patient was advised to use tylenol for pain relief and follow up with [facility] if not improved. Patient states that he is still having rib and back pain that is sharp, nonradiating, and exacerbated by movement; he denies shortness of breath, LE weakness, numbness, tingling, bowel or bladder incontinence. He was taking 500mg  tylenol QID with some improvement in symptoms but was afraid he was taking too much so cut down to twice a day. He has tried heat as he was told this in the ED, but found that ice provides more relief.   Please see problem based Assessment and Plan for status of patients chronic conditions.  Past Medical History:  Diagnosis Date  . Arthritis   . Asthma   . CHF (congestive heart failure) ([place])   . Diabetes mellitus 2003  . GERD (gastroesophageal reflux disease)   . Hyperlipidemia   . Hypertension   . Morbid obesity ([place])   . Sleep apnea     Review of Systems:   Review of Systems  Constitutional: Negative for chills and fever.  Cardiovascular: Negative for chest pain and leg swelling.  Gastrointestinal: Negative for constipation and diarrhea.  Musculoskeletal: Positive for back pain, joint pain (knees - mild) and myalgias. Negative for falls (no further falls since two weeks ago).  Neurological: Negative for tingling, tremors, sensory change and weakness.    Physical Exam:  Vitals:   08/07/16 1334  BP: 131/67  Pulse: 90  Temp: 97.7 F (36.5 C)  TempSrc: Oral  SpO2: 97%  Weight:  245 lb 4.8 oz (111.3 kg)  Height: 5\' 6"  (1.676 m)   Physical Exam  Constitutional: He is oriented to person, place, and time. He appears well-developed and well-nourished. No distress.  HENT:  Head: Normocephalic and atraumatic.  Eyes: EOM are normal. No scleral icterus.  Neck: Normal range of motion. Neck supple.  Cardiovascular: Normal rate, regular rhythm, normal heart sounds and intact distal pulses.  Exam reveals no gallop and no friction rub.   No murmur heard. Pulmonary/Chest: Effort normal and breath sounds normal. He has no wheezes. He has no rales.  Abdominal: Soft. Bowel sounds are normal. There is no tenderness.  Musculoskeletal: Normal range of motion. He exhibits no edema or deformity.  Tenderness on right posterior, lower ribs and extending to musculature below; no deformity appreciated. No bruising, erythema or edema appreciated  Neurological: He is alert and oriented to person, place, and time. No sensory deficit. He exhibits normal muscle tone.  Skin: Skin is warm and dry. Capillary refill takes less than 2 seconds. No rash noted. He is not diaphoretic. No erythema.  Psychiatric: He has a normal mood and affect. His behavior is normal. Judgment and thought content normal.    Assessment & Plan:   See Encounters Tab for problem based charting.   Patient discussed with Dr. Carmel Sacramento, MD Internal Medicine PGY1

## 2016-08-09 NOTE — Assessment & Plan Note (Signed)
Patient with continued right-sided posterior rib pain from recent fall at work. Had relief with tylenol and ice.   Exam reveals tenderness, but no deformity, swelling, or erythema over site.   Patient's signs and symptoms consistent with likely rib bruising from fall.  Plan: --Tylenol up to 4000mg  a day (no liver disfunction) --Heat/Ice throughout the day  --voltaren gel - provided patient with coupon

## 2016-08-09 NOTE — Assessment & Plan Note (Signed)
At last visit with PCP, patient was instructed to self titrate his Lantus from 10 units qhs as necessary to achieve AM CBGs of >150. He states he had gotten to taking 20 units with good control but had to go back to 10 units due to running low on insulin. He has been tolerating the victoza well.  Plan: --provided patient with Lantus, again with instructions to titrate up by 2 units daily to achieve CBG >150 --provided victoza sample - continue 1.2mg  daily --continue canagliflozin 100mg  daily --f/u with PCP in 4-6 weeks for A1c recheck and further management

## 2016-08-09 NOTE — Assessment & Plan Note (Addendum)
Patient with continued back pain after recent fall at work. Exam reveals muscular tenderness on right side - no bony tenderness of spine or hips. No alarm symptoms; strength and sensation in bil LE intact.   Signs and symptoms consistent with muscle spasm.  Plan: --heat/ice --short course of robaxin --tylenol

## 2016-08-13 ENCOUNTER — Ambulatory Visit: Payer: Self-pay

## 2016-08-13 NOTE — Progress Notes (Signed)
Internal Medicine Clinic Attending  Case discussed with Dr. Svalina  at the time of the visit.  We reviewed the resident's history and exam and pertinent patient test results.  I agree with the assessment, diagnosis, and plan of care documented in the resident's note.  

## 2016-08-22 ENCOUNTER — Ambulatory Visit: Payer: Self-pay

## 2016-08-30 ENCOUNTER — Encounter: Payer: Self-pay | Admitting: Pharmacist

## 2016-08-30 NOTE — Progress Notes (Signed)
Patient renewing application for Invokana patient assistance program. Application faxed today

## 2016-09-04 ENCOUNTER — Ambulatory Visit (INDEPENDENT_AMBULATORY_CARE_PROVIDER_SITE_OTHER): Payer: Worker's Compensation | Admitting: Internal Medicine

## 2016-09-04 ENCOUNTER — Encounter: Payer: Self-pay | Admitting: Internal Medicine

## 2016-09-04 ENCOUNTER — Encounter (INDEPENDENT_AMBULATORY_CARE_PROVIDER_SITE_OTHER): Payer: Self-pay

## 2016-09-04 VITALS — BP 109/70 | HR 86 | Temp 97.8°F | Resp 86 | Ht 66.0 in | Wt 243.0 lb

## 2016-09-04 DIAGNOSIS — M546 Pain in thoracic spine: Secondary | ICD-10-CM | POA: Diagnosis not present

## 2016-09-04 DIAGNOSIS — E669 Obesity, unspecified: Secondary | ICD-10-CM

## 2016-09-04 DIAGNOSIS — R0781 Pleurodynia: Secondary | ICD-10-CM

## 2016-09-04 MED ORDER — LIDO-CAPSAICIN-MEN-METHYL SAL 0.5-0.035-5-20 % EX PTCH
1.0000 | MEDICATED_PATCH | Freq: Every day | CUTANEOUS | 0 refills | Status: DC
Start: 2016-09-04 — End: 2017-05-05

## 2016-09-04 NOTE — Patient Instructions (Signed)
General Instructions: - Increase Tylenol to 1000 mg three times daily - Continue heating pads - Pick up lidocaine patches. If too expensive, can try salon paws patches - Consider getting a massage if able   Please bring your medicines with you each time you come to clinic.  Medicines may include prescription medications, over-the-counter medications, herbal remedies, eye drops, vitamins, or other pills.   Progress Toward Treatment Goals:  No flowsheet data found.  Self Care Goals & Plans:  Self Care Goal 07/11/2016  Manage my medications take my medicines as prescribed; bring my medications to every visit; refill my medications on time  Monitor my health keep track of my blood glucose; bring my glucose meter and log to each visit  Eat healthy foods drink diet soda or water instead of juice or soda; eat more vegetables; eat foods that are low in salt; eat baked foods instead of fried foods  Be physically active find an activity I enjoy  Meeting treatment goals -    No flowsheet data found.   Care Management & Community Referrals:  No flowsheet data found.

## 2016-09-04 NOTE — Progress Notes (Signed)
   CC: Follow up of back pain  HPI:  [name] is a 61 y.o. man with PMHx as noted below who presents today for follow up of his back pain.  Patient had a fall at work on March 10 where he tripped on a computer wire and fell onto his right back. He was evaluated in the ED and a lumbar x-ray and right rib x-ray were negative for fracture or dislocation. He was then evaluated in the [facility] and recommended to take Tylenol, use heat/ice, and voltaren gel as needed for likely rib bruising on the right side.  Today, he reports he has continued to have pain on the right side. He describes the pain as stabbing and achy, and is worse with standing and sitting. 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 He has tried taking robaxin but this makes him sleepy and he cannot take this at work. He reports mild relief with Tylenol 500 mg 4 times daily, heating pads, and the voltaren gel. He reports he had a massage the day after the fall and felt this made his pain worse.   Past Medical History:  Diagnosis Date  . Arthritis   . Asthma   . CHF (congestive heart failure) ([place])   . Diabetes mellitus 2003  . GERD (gastroesophageal reflux disease)   . Hyperlipidemia   . Hypertension   . Morbid obesity ([place])   . Sleep apnea     Review of Systems:   All ROM negative except per HPI  Physical Exam:  Vitals:   09/04/16 1404  BP: 109/70  Pulse: 86  Resp: (!) 86  Temp: 97.8 F (36.6 C)  TempSrc: Oral  SpO2: 95%  Weight: 243 lb (110.2 kg)  Height: 5\' 6"  (1.676 m)   General: Obese man in NAD Msk: There is tenderness to palpation of the right mid-back, especially over the ribs. There is no swelling or erythema present. Patient is able to lift his arms without difficulty. He is limited in lifting his right leg due to pain. Pain is elicited when he leans to the left side.   Assessment & Plan:   See Encounters Tab for problem based charting.  Patient discussed  with Dr. Lynnae January

## 2016-09-05 NOTE — Assessment & Plan Note (Signed)
He continues to have pain on the right side s/p a fall. Recommended for him to increase Tylenol to 1000 mg TID scheduled and continue using the heating pad and voltaren gel. Also prescribed him Lidocaine patches if he is able to get them or can try salon paws patches. Recommended to try getting another massage as his first one was probably too early in his injury to be beneficial. Advised to return to clinic in 1 month if pain persists.

## 2016-09-06 NOTE — Progress Notes (Signed)
Internal Medicine Clinic Attending  Case discussed with Dr. Rivet at the time of the visit.  We reviewed the resident's history and exam and pertinent patient test results.  I agree with the assessment, diagnosis, and plan of care documented in the resident's note.  

## 2016-09-12 ENCOUNTER — Telehealth: Payer: Self-pay | Admitting: Pharmacist

## 2016-09-12 NOTE — Progress Notes (Signed)
Patient called stating that his BG this morning was in the 500s and he was experiencing nausea/vomiting. He reports taking 10 units more of Lantus (takes 10 units every night) and is feeling well now. Advised patient to schedule appointment in [facility] but he declined due to feeling better and states he will call for an appointment if his symptoms return. Of note, patient re-started canagliflozin a few days ago after receiving the medication through patient assistance. Advised patient to purchase OTC ketone test strips to monitor for acidosis.  He is out of liraglutide and is working on access to liraglutide but was told he needs to get a rejection from [facility] prior to qualifying. Will continue to work with patient on liraglutide access. Patient asked whether to increase Lantus dose to 10 units every morning and every evening. Advised if BG 150-200 tonight, then take 5 units, but if BG > 200, take 10 units as usual. Patient verbalized understanding of all information. Will follow up with patient tomorrow and notify PCP.

## 2016-09-16 ENCOUNTER — Ambulatory Visit (INDEPENDENT_AMBULATORY_CARE_PROVIDER_SITE_OTHER): Payer: Self-pay | Admitting: Internal Medicine

## 2016-09-16 ENCOUNTER — Encounter (INDEPENDENT_AMBULATORY_CARE_PROVIDER_SITE_OTHER): Payer: Self-pay

## 2016-09-16 ENCOUNTER — Encounter: Payer: Self-pay | Admitting: Internal Medicine

## 2016-09-16 VITALS — BP 130/69 | HR 75 | Temp 98.1°F | Ht 66.0 in | Wt 238.4 lb

## 2016-09-16 DIAGNOSIS — IMO0001 Reserved for inherently not codable concepts without codable children: Secondary | ICD-10-CM

## 2016-09-16 DIAGNOSIS — Z794 Long term (current) use of insulin: Secondary | ICD-10-CM

## 2016-09-16 DIAGNOSIS — E1165 Type 2 diabetes mellitus with hyperglycemia: Secondary | ICD-10-CM

## 2016-09-16 LAB — POCT GLYCOSYLATED HEMOGLOBIN (HGB A1C)

## 2016-09-16 LAB — GLUCOSE, CAPILLARY: Glucose-Capillary: 334 mg/dL — ABNORMAL HIGH (ref 65–99)

## 2016-09-16 NOTE — Patient Instructions (Addendum)
It was a pleasure to meet you [name].  I think you are doing the right things with your diet and weight loss to help get better control of your diabetes.  We need to work on getting your medications so you can take them consistently for better blood sugar control.  Please start taking Lantus 12 units at night to help control your blood sugars throughout the day.  Continue your Invokana as prescribed.  Hopefully we can get you back on Victoza soon. Please continue this when you receive it.  Please continue to watch your diet and avoid sugary drinks and foods.  I will ask our diabetic counselor to call you in a couple weeks to see how your sugars are doing.  Please follow up with Dr. Juleen China as scheduled.   Diabetes Mellitus and Food It is important for you to manage your blood sugar (glucose) level. Your blood glucose level can be greatly affected by what you eat. Eating healthier foods in the appropriate amounts throughout the day at about the same time each day will help you control your blood glucose level. It can also help slow or prevent worsening of your diabetes mellitus. Healthy eating may even help you improve the level of your blood pressure and reach or maintain a healthy weight. General recommendations for healthful eating and cooking habits include:  Eating meals and snacks regularly. Avoid going long periods of time without eating to lose weight.  Eating a diet that consists mainly of plant-based foods, such as fruits, vegetables, nuts, legumes, and whole grains.  Using low-heat cooking methods, such as baking, instead of high-heat cooking methods, such as deep frying. Work with your dietitian to make sure you understand how to use the Nutrition Facts information on food labels. How can food affect me? Carbohydrates  Carbohydrates affect your blood glucose level more than any other type of food. Your dietitian will help you determine how many carbohydrates to eat at  each meal and teach you how to count carbohydrates. Counting carbohydrates is important to keep your blood glucose at a healthy level, especially if you are using insulin or taking certain medicines for diabetes mellitus. Alcohol  Alcohol can cause sudden decreases in blood glucose (hypoglycemia), especially if you use insulin or take certain medicines for diabetes mellitus. Hypoglycemia can be a life-threatening condition. Symptoms of hypoglycemia (sleepiness, dizziness, and disorientation) are similar to symptoms of having too much alcohol. If your health care provider has given you approval to drink alcohol, do so in moderation and use the following guidelines:  Women should not have more than one drink per day, and men should not have more than two drinks per day. One drink is equal to:  12 oz of beer.  5 oz of wine.  1 oz of hard liquor.  Do not drink on an empty stomach.  Keep yourself hydrated. Have water, diet soda, or unsweetened iced tea.  Regular soda, juice, and other mixers might contain a lot of carbohydrates and should be counted. What foods are not recommended? As you make food choices, it is important to remember that all foods are not the same. Some foods have fewer nutrients per serving than other foods, even though they might have the same number of calories or carbohydrates. It is difficult to get your body what it needs when you eat foods with fewer nutrients. Examples of foods that you should avoid that are high in calories and carbohydrates but low in nutrients include:  Trans  fats (most processed foods list trans fats on the Nutrition Facts label).  Regular soda.  Juice.  Candy.  Sweets, such as cake, pie, doughnuts, and cookies.  Fried foods. What foods can I eat? Eat nutrient-rich foods, which will nourish your body and keep you healthy. The food you should eat also will depend on several factors, including:  The calories you need.  The medicines you  take.  Your weight.  Your blood glucose level.  Your blood pressure level.  Your cholesterol level. You should eat a variety of foods, including:  Protein.  Lean cuts of meat.  Proteins low in saturated fats, such as fish, egg whites, and beans. Avoid processed meats.  Fruits and vegetables.  Fruits and vegetables that may help control blood glucose levels, such as apples, mangoes, and yams.  Dairy products.  Choose fat-free or low-fat dairy products, such as milk, yogurt, and cheese.  Grains, bread, pasta, and rice.  Choose whole grain products, such as multigrain bread, whole oats, and brown rice. These foods may help control blood pressure.  Fats.  Foods containing healthful fats, such as nuts, avocado, olive oil, canola oil, and fish. Does everyone with diabetes mellitus have the same meal plan? Because every person with diabetes mellitus is different, there is not one meal plan that works for everyone. It is very important that you meet with a dietitian who will help you create a meal plan that is just right for you. This information is not intended to replace advice given to you by your health care provider. Make sure you discuss any questions you have with your health care provider. Document Released: 01/31/2005 Document Revised: 10/12/2015 Document Reviewed: 04/02/2013 Elsevier Interactive Patient Education  2017 [employer].

## 2016-09-16 NOTE — Assessment & Plan Note (Signed)
Patient's diabetes is poorly controlled mainly due to difficulty obtaining medications and education. His Hgb A1c today is >14.0. I advised him to take his Lantus regularly and increase it to 12 units nightly. He will continue his Canagliflozin as prescribed and hopefully will be able to obtain Liraglutide soon. He will continue to work on healthy eating patterns and weight loss. - Take Lantus 12 units qhs - Continue Canagliflozin 100 mg daily - Continue Victoza 1.2 mg daily when obtained - Continue to check CBGs with meals; bring meter on follow up - Continue dietary changes - nutritional hand out provided in AVS - Will ask our diabetic counselor Debera Lat, RD to call pt in 2 weeks - f/u with PCP in 4 weeks - Recheck Hgb A1c in 3 months

## 2016-09-16 NOTE — Progress Notes (Signed)
Internal Medicine Clinic Attending  Case discussed with Dr. V Patel at the time of the visit.  We reviewed the resident's history and exam and pertinent patient test results.  I agree with the assessment, diagnosis, and plan of care documented in the resident's note.  

## 2016-09-16 NOTE — Progress Notes (Signed)
CC: T2DM  HPI:  [name] is a 61 y.o. male with PMH as listed below who presents for follow up management of his T2DM. Please see problem based charting for status of patient's chronic medical issues.  T2DM: Last Hgb A1c was 12.4 on 05/30/16. Patient is currently prescribed Liraglutide 1.2 mg daily, Cangliflozin 100 mg daily, and Lantus. He has been taking the Lantus inconsistently with varying doses. He was taking 10 units nightly but only took 2 units last night. He ran out the Canagliflozin for about 10 days which he restarted 3-4 days ago. He is also out of his Liraglutide for the last week. We have been providing samples for him and are awaiting access to Liraglutide with the assistance of our Pharmacist, Dr. Maudie Mercury.   He says his blood sugars have been running in the 300-400s recently and up to 500s. He says he feels normal when his CBGs are around 250 but does have a "funny feeling" when they are in the 400s. He reports increased urinary frequency and thirst. He denies any chills, diaphoresis, or loss of consciousness. He has been working on dietary changes and weight loss. He is avoiding sodas, sweet tea, fruit juices, and trying to eat consistent meals with portion control. He has been drinking protein shakes recently, but has not really checked the nutrition labels. He brought his meter which shows readings over the last 5 days with AVG BG of 375 and range from 246 - 543. His CBG in clinic is 334. He was intolerant to Metformin due to mood swings and confusion. He was previously taking Amaryl which was discontinued when he started Liraglutide.   Past Medical History:  Diagnosis Date  . Arthritis   . Asthma   . CHF (congestive heart failure) ([place])   . Diabetes mellitus 2003  . GERD (gastroesophageal reflux disease)   . Hyperlipidemia   . Hypertension   . Morbid obesity ([place])   . Sleep apnea     Review of Systems:   Review of Systems  Constitutional: Negative for chills  and fever.  Respiratory: Negative for shortness of breath.   Cardiovascular: Negative for chest pain.  Gastrointestinal: Negative for abdominal pain, constipation, diarrhea, nausea and vomiting.  Genitourinary: Positive for frequency.       Increased thirst  Musculoskeletal: Negative for falls.       Right chest wall pain  Neurological: Negative for loss of consciousness.     Physical Exam:  Vitals:   09/16/16 1531  BP: 130/69  Pulse: 75  Temp: 98.1 F (36.7 C)  TempSrc: Oral  SpO2: 99%  Weight: 238 lb 6.4 oz (108.1 kg)  Height: 5\' 6"  (1.676 m)   Physical Exam  Constitutional: He is oriented to person, place, and time. He appears well-developed and well-nourished. No distress.  Cardiovascular: Normal rate and regular rhythm.   Pulmonary/Chest: Effort normal. No respiratory distress. He has no wheezes.  Abdominal: Soft. He exhibits no distension. There is no tenderness.  Neurological: He is alert and oriented to person, place, and time.  Skin: He is not diaphoretic.  Psychiatric: He has a normal mood and affect.    Assessment & Plan:   See Encounters Tab for problem based charting.  Patient discussed with Dr. Lynnae January  Diabetes mellitus type 2, uncontrolled, without complications ([place]) Patient's diabetes is poorly controlled mainly due to difficulty obtaining medications and education. His Hgb A1c today is >14.0. I advised him to take his Lantus regularly and increase it  to 12 units nightly. He will continue his Canagliflozin as prescribed and hopefully will be able to obtain Liraglutide soon. He will continue to work on healthy eating patterns and weight loss. - Take Lantus 12 units qhs - Continue Canagliflozin 100 mg daily - Continue Victoza 1.2 mg daily when obtained - Continue to check CBGs with meals; bring meter on follow up - Continue dietary changes - nutritional hand out provided in AVS - Will ask our diabetic counselor Debera Lat, RD to call pt in 2 weeks -  f/u with PCP in 4 weeks - Recheck Hgb A1c in 3 months

## 2016-09-25 ENCOUNTER — Encounter: Payer: Self-pay | Admitting: Pharmacist

## 2016-09-25 NOTE — Progress Notes (Signed)
Medication Samples have been provided to the patient.  Drug: liraglutide (Victoza) Strength: 18 mg/3mL Qty: 1 LOT: C2017A Exp.Date: Jan 2019 Dosing instructions: 0.6 mg daily x 1 week, then 1.2 mg daily  The patient has been instructed regarding the correct time, dose, and frequency of taking this medication, including desired effects and most common side effects.   Thomas Gardner 3:48 PM 09/25/2016 

## 2016-09-30 ENCOUNTER — Telehealth: Payer: Self-pay | Admitting: Dietician

## 2016-09-30 DIAGNOSIS — IMO0001 Reserved for inherently not codable concepts without codable children: Secondary | ICD-10-CM

## 2016-09-30 DIAGNOSIS — E1165 Type 2 diabetes mellitus with hyperglycemia: Principal | ICD-10-CM

## 2016-09-30 NOTE — Telephone Encounter (Signed)
CDE called to follow up on diabetes control per Dr. Sherrye Payor: Mr. Heidinger says he has been taking all of his medicine as directed for about 1 week now and his blood sugars are still in the 250-350 range.  Invokana, lantus- 12 units, 1.2 victoza/day. I asked him if he wanted me to obtain permission for him to increase his lantus on his own until his morning blood sugars are < 150 mg/dl. He said DR. Juleen China had already told him to do that and he just didn't know how. With the instructions of increasing the lantus dose by 1-2 units/day until his morning blood sugar is <150 mg/dl he says he understands and will do that until he comes in to see Dr. Juleen China on 10/17/16.

## 2016-09-30 NOTE — Telephone Encounter (Signed)
I am okay with that.  Thanks!

## 2016-10-01 MED ORDER — INSULIN GLARGINE 100 UNIT/ML SOLOSTAR PEN: PEN_INJECTOR | SUBCUTANEOUS | 11 refills | Status: DC

## 2016-10-06 ENCOUNTER — Other Ambulatory Visit: Payer: Self-pay | Admitting: Internal Medicine

## 2016-10-06 DIAGNOSIS — E1165 Type 2 diabetes mellitus with hyperglycemia: Principal | ICD-10-CM

## 2016-10-06 DIAGNOSIS — IMO0001 Reserved for inherently not codable concepts without codable children: Secondary | ICD-10-CM

## 2016-10-07 ENCOUNTER — Other Ambulatory Visit: Payer: Self-pay | Admitting: Internal Medicine

## 2016-10-07 DIAGNOSIS — IMO0001 Reserved for inherently not codable concepts without codable children: Secondary | ICD-10-CM

## 2016-10-07 DIAGNOSIS — E1165 Type 2 diabetes mellitus with hyperglycemia: Principal | ICD-10-CM

## 2016-10-07 MED ORDER — CANAGLIFLOZIN 100 MG PO TABS: 100.0000 mg | ORAL_TABLET | Freq: Every day | ORAL | 1 refills | Status: DC

## 2016-10-08 NOTE — Telephone Encounter (Signed)
Emmanuel from [place] request canagliflozin (INVOKANA) 100 MG TABS tablet to be filled.

## 2016-10-17 ENCOUNTER — Encounter: Payer: Self-pay | Admitting: Internal Medicine

## 2016-11-07 ENCOUNTER — Encounter: Payer: Self-pay | Admitting: Internal Medicine

## 2016-11-07 ENCOUNTER — Ambulatory Visit (INDEPENDENT_AMBULATORY_CARE_PROVIDER_SITE_OTHER): Payer: Self-pay | Admitting: Internal Medicine

## 2016-11-07 VITALS — BP 105/63 | HR 77 | Temp 97.5°F | Wt 239.6 lb

## 2016-11-07 DIAGNOSIS — Z1159 Encounter for screening for other viral diseases: Secondary | ICD-10-CM

## 2016-11-07 DIAGNOSIS — IMO0001 Reserved for inherently not codable concepts without codable children: Secondary | ICD-10-CM

## 2016-11-07 DIAGNOSIS — Z0000 Encounter for general adult medical examination without abnormal findings: Secondary | ICD-10-CM

## 2016-11-07 DIAGNOSIS — E118 Type 2 diabetes mellitus with unspecified complications: Secondary | ICD-10-CM

## 2016-11-07 DIAGNOSIS — Z794 Long term (current) use of insulin: Secondary | ICD-10-CM

## 2016-11-07 DIAGNOSIS — E1165 Type 2 diabetes mellitus with hyperglycemia: Secondary | ICD-10-CM

## 2016-11-07 MED ORDER — INSULIN GLARGINE 100 UNIT/ML SOLOSTAR PEN: 10.0000 [IU] | PEN_INJECTOR | Freq: Two times a day (BID) | SUBCUTANEOUS | 11 refills | Status: DC

## 2016-11-07 NOTE — Patient Instructions (Addendum)
Thank you for coming to see me today. It was a pleasure. Today we talked about:   Increase your lantus to 10 units twice per day.  I have given you a work note.  Stop by the lab on your way out.  Will let you know if anything is abnormal.  Please follow-up with me in 2 months  If you have any questions or concerns, please do not hesitate to call the office at [phone].  Take Care,   Jule Ser, DO

## 2016-11-07 NOTE — Progress Notes (Signed)
   CC: here for DM follow up  HPI:  Thomas Gardner is a 61 y.o. man with a past medical history listed below here today for follow up of his DM.   For details of today's visit and the status of his chronic medical issues please refer to the assessment and plan.   Past Medical History:  Diagnosis Date  . Arthritis   . Asthma   . CHF (congestive heart failure) ([place])   . Diabetes mellitus 2003  . GERD (gastroesophageal reflux disease)   . Hyperlipidemia   . Hypertension   . Morbid obesity ([place])   . Sleep apnea     Review of Systems:  Please see pertinent ROS reviewed in HPI and problem based charting.   Physical Exam:  Vitals:   11/07/16 1529  BP: 105/63  Pulse: 77  Temp: 97.5 F (36.4 C)  TempSrc: Oral  SpO2: 96%  Weight: 239 lb 9.6 oz (108.7 kg)   Physical Exam  Constitutional: He is oriented to person, place, and time. He appears well-developed and well-nourished. No distress.  HENT:  Head: Normocephalic and atraumatic.  Eyes: Conjunctivae and EOM are normal.  Cardiovascular: Normal rate and regular rhythm.   Pulmonary/Chest: Effort normal and breath sounds normal.  Neurological: He is alert and oriented to person, place, and time.  Skin: Skin is warm and dry.     Assessment & Plan:   See Encounters Tab for problem based charting.  Patient discussed with Dr. Daryll Drown.   No problem-specific Assessment & Plan notes found for this encounter.

## 2016-11-07 NOTE — Assessment & Plan Note (Signed)
Assessment: Uncontrolled DM with A1c greater than 14 in April 2018.  Has experienced difficulty with obtaining medications that we have worked to resolve with assistance of our clinic pharmacist.  Currently he is titrated up to Lantus 8 units QAM and 10 units QPM.  Also taking Victoza and Invokana.  Since seeing Dr. Posey Pronto in April 2018, he reports fasting sugars running in the low to mid 200s.  Still experiences some polyuria and polydipsia but not as much so now.  He is working with [employer] for Rx support.  He has been out of his Lantus since yesterday but reports has been given assistance today in acquiring.  Plan: - increase Lantus to 10 units BID.  He did not bring his meter today and was encouraged to do so at his next follow up. - continue Victoza and Invokana - RTC in 2 months for repeat A1c

## 2016-11-07 NOTE — Progress Notes (Signed)
error 

## 2016-11-07 NOTE — Assessment & Plan Note (Signed)
A/P: Declined pneumonia vaccine Given stool cards Check HCV

## 2016-11-08 LAB — HEPATITIS C ANTIBODY

## 2016-11-10 NOTE — Progress Notes (Signed)
Internal Medicine Clinic Attending  Case discussed with Dr. Wallace at the time of the visit.  We reviewed the resident's history and exam and pertinent patient test results.  I agree with the assessment, diagnosis, and plan of care documented in the resident's note.  

## 2016-11-19 ENCOUNTER — Other Ambulatory Visit: Payer: Self-pay | Admitting: Pharmacist

## 2016-11-19 DIAGNOSIS — E1165 Type 2 diabetes mellitus with hyperglycemia: Principal | ICD-10-CM

## 2016-11-19 DIAGNOSIS — IMO0001 Reserved for inherently not codable concepts without codable children: Secondary | ICD-10-CM

## 2016-11-19 MED ORDER — LIRAGLUTIDE 18 MG/3ML ~~LOC~~ SOPN: 1.2000 mg | PEN_INJECTOR | Freq: Every day | SUBCUTANEOUS | 11 refills | Status: DC

## 2016-11-19 MED ORDER — INSULIN GLARGINE 100 UNIT/ML SOLOSTAR PEN: 10.0000 [IU] | PEN_INJECTOR | Freq: Two times a day (BID) | SUBCUTANEOUS | 11 refills | Status: DC

## 2016-11-19 NOTE — Progress Notes (Signed)
Lantus and Victoza prescriptions transferred to [facility] pharmacy for patient financial assistance.

## 2016-12-19 ENCOUNTER — Encounter: Payer: Self-pay | Admitting: Internal Medicine

## 2016-12-26 ENCOUNTER — Ambulatory Visit (INDEPENDENT_AMBULATORY_CARE_PROVIDER_SITE_OTHER): Payer: Self-pay | Admitting: Internal Medicine

## 2016-12-26 ENCOUNTER — Encounter: Payer: Self-pay | Admitting: Internal Medicine

## 2016-12-26 VITALS — BP 115/61 | HR 80 | Temp 98.1°F | Ht 66.0 in | Wt 238.7 lb

## 2016-12-26 DIAGNOSIS — Z6838 Body mass index (BMI) 38.0-38.9, adult: Secondary | ICD-10-CM

## 2016-12-26 DIAGNOSIS — M62830 Muscle spasm of back: Secondary | ICD-10-CM

## 2016-12-26 DIAGNOSIS — E1165 Type 2 diabetes mellitus with hyperglycemia: Principal | ICD-10-CM

## 2016-12-26 DIAGNOSIS — Z794 Long term (current) use of insulin: Principal | ICD-10-CM

## 2016-12-26 DIAGNOSIS — I10 Essential (primary) hypertension: Secondary | ICD-10-CM

## 2016-12-26 DIAGNOSIS — I509 Heart failure, unspecified: Secondary | ICD-10-CM

## 2016-12-26 DIAGNOSIS — E119 Type 2 diabetes mellitus without complications: Secondary | ICD-10-CM

## 2016-12-26 DIAGNOSIS — IMO0001 Reserved for inherently not codable concepts without codable children: Secondary | ICD-10-CM

## 2016-12-26 LAB — POCT GLYCOSYLATED HEMOGLOBIN (HGB A1C): Hemoglobin A1C: 10.8

## 2016-12-26 LAB — GLUCOSE, CAPILLARY: Glucose-Capillary: 161 mg/dL — ABNORMAL HIGH (ref 65–99)

## 2016-12-26 MED ORDER — LOSARTAN POTASSIUM 50 MG PO TABS: 50.0000 mg | ORAL_TABLET | Freq: Every day | ORAL | 3 refills | Status: DC

## 2016-12-26 MED ORDER — ROSUVASTATIN CALCIUM 10 MG PO TABS: 10.0000 mg | ORAL_TABLET | Freq: Every day | ORAL | 3 refills | Status: DC

## 2016-12-26 MED ORDER — CARVEDILOL 25 MG PO TABS: 25.0000 mg | ORAL_TABLET | Freq: Two times a day (BID) | ORAL | 1 refills | Status: DC

## 2016-12-26 MED ORDER — CHLORTHALIDONE 25 MG PO TABS: 25.0000 mg | ORAL_TABLET | Freq: Every day | ORAL | 3 refills | Status: DC

## 2016-12-26 MED ORDER — PANTOPRAZOLE SODIUM 40 MG PO TBEC: 40.0000 mg | DELAYED_RELEASE_TABLET | Freq: Every day | ORAL | 0 refills | Status: DC

## 2016-12-26 MED ORDER — GABAPENTIN 300 MG PO CAPS: 300.0000 mg | ORAL_CAPSULE | Freq: Three times a day (TID) | ORAL | 0 refills | Status: DC

## 2016-12-26 MED ORDER — EZETIMIBE 10 MG PO TABS
10.0000 mg | ORAL_TABLET | Freq: Every day | ORAL | 3 refills | Status: DC
Start: 2016-12-26 — End: 2017-09-08

## 2016-12-26 MED ORDER — NAPROXEN 500 MG PO TABS: 500.0000 mg | ORAL_TABLET | Freq: Two times a day (BID) | ORAL | 0 refills | Status: DC

## 2016-12-26 MED FILL — CHLORTHALIDONE 25 MG TABLET: 25 | 30 days supply | Qty: 30 | Fill #0

## 2016-12-26 MED FILL — LOSARTAN POTASSIUM 50 MG TA: 50 | 30 days supply | Qty: 30 | Fill #0

## 2016-12-26 MED FILL — ROSUVASTATIN CALCIUM 10 MG: 10 | 30 days supply | Qty: 30 | Fill #0

## 2016-12-26 MED FILL — CARVEDILOL 25 MG TABLET: 25 | 30 days supply | Qty: 60 | Fill #0

## 2016-12-26 NOTE — Progress Notes (Signed)
Patient requested transfer of losartan, chlorthalidone, carvedilol, and rosuvastatin to [facility], ezetimibe to [facility]. Prescriptions sent.

## 2016-12-26 NOTE — Patient Instructions (Signed)
Thank you for coming to see me today. It was a pleasure. Today we talked about:   Low Back and Hip Pain: - referral was placed to physical therapy - short course of anti-inflammatory pain medication with gabapentin was ordered - also ordered protonix to take with naproxen given your bleed history  Please follow-up with [place] in 2 weeks to see how you are doing.  If you have any questions or concerns, please do not hesitate to call the office at [phone].  Take Care,   Jule Ser, DO

## 2016-12-26 NOTE — Assessment & Plan Note (Signed)
Assessment: Body mass index is 38.53 kg/m. This is an improvement from January.  Plan:  - continue to encourage weight loss, diet and exercise.

## 2016-12-26 NOTE — Assessment & Plan Note (Signed)
Assessment: Patient continues to struggle with lumbar pain and right hip pain/buttocks pain.  Occasionally feels sharp, radiating pain down his right leg.  Otherwise, feels achy and throbbing.  Robaxin makes him sleepy, he has found minimal relief with Tylenol.  Has not been on NSAIDS due to history of GI bleed in the past.  His pain sounds like lumbosacral radiculopathy pain and sciatic nerve/piriformis pain.  Plan: - refer to PT - short course NSAIDs with PPI prophylaxis given history - gabapentin 300mg  TID - RTC 2 weeks to reassess.  If no improvement, would recommend MRI.

## 2016-12-26 NOTE — Progress Notes (Signed)
   CC: here for DM f/u and hip pain  HPI:  [name] is a 61 y.o. man with a past medical history listed below here today for follow up of his DM and he has hip pain.   For details of today's visit and the status of his chronic medical issues please refer to the assessment and plan.   Past Medical History:  Diagnosis Date  . Arthritis   . Asthma   . CHF (congestive heart failure) ([place])   . Diabetes mellitus 2003  . GERD (gastroesophageal reflux disease)   . Hyperlipidemia   . Hypertension   . Morbid obesity ([place])   . Sleep apnea    Review of Systems:  Please see pertinent ROS reviewed in HPI and problem based charting.   Physical Exam:  Vitals:   12/26/16 1614  BP: 115/61  Pulse: 80  Temp: 98.1 F (36.7 C)  TempSrc: Oral  SpO2: 99%  Weight: 238 lb 11.2 oz (108.3 kg)  Height: 5\' 6"  (1.676 m)   General: NAD MSK: normal 5/5 LE strength.  TTP over lumbar paraspinal muscles on Right.  TTP of Right piriformis muscle.  Negative SL raise test. Neuro: alert and oriented X3, cranial nerves II-XII grossly intact   Assessment & Plan:   See Encounters Tab for problem based charting.  Patient discussed with Dr. Angelia Mould .  Morbid obesity ([place]) Assessment: Body mass index is 38.53 kg/m. This is an improvement from January.  Plan:  - continue to encourage weight loss, diet and exercise.  Muscle spasm of back Assessment: Patient continues to struggle with lumbar pain and right hip pain/buttocks pain.  Occasionally feels sharp, radiating pain down his right leg.  Otherwise, feels achy and throbbing.  Robaxin makes him sleepy, he has found minimal relief with Tylenol.  Has not been on NSAIDS due to history of GI bleed in the past.  His pain sounds like lumbosacral radiculopathy pain and sciatic nerve/piriformis pain.  Plan: - refer to PT - short course NSAIDs with PPI prophylaxis given history - gabapentin 300mg  TID - RTC 2 weeks to reassess.  If no improvement,  would recommend MRI.

## 2016-12-30 NOTE — Progress Notes (Signed)
Internal Medicine Clinic Attending  Case discussed with Dr. Wallace at the time of the visit.  We reviewed the resident's history and exam and pertinent patient test results.  I agree with the assessment, diagnosis, and plan of care documented in the resident's note.  

## 2017-01-09 ENCOUNTER — Encounter: Payer: Self-pay | Admitting: *Deleted

## 2017-01-14 ENCOUNTER — Encounter: Payer: Self-pay | Admitting: Internal Medicine

## 2017-01-14 ENCOUNTER — Ambulatory Visit (INDEPENDENT_AMBULATORY_CARE_PROVIDER_SITE_OTHER): Payer: Self-pay | Admitting: Internal Medicine

## 2017-01-14 DIAGNOSIS — G5701 Lesion of sciatic nerve, right lower limb: Secondary | ICD-10-CM

## 2017-01-14 DIAGNOSIS — Z8719 Personal history of other diseases of the digestive system: Secondary | ICD-10-CM

## 2017-01-14 MED ORDER — ACETAMINOPHEN 500 MG PO TABS: 1000.0000 mg | ORAL_TABLET | Freq: Three times a day (TID) | ORAL | 1 refills | Status: AC | PRN

## 2017-01-14 MED ORDER — METHOCARBAMOL 500 MG PO TABS: 500.0000 mg | ORAL_TABLET | Freq: Every evening | ORAL | 0 refills | Status: DC | PRN

## 2017-01-14 MED ORDER — GABAPENTIN 300 MG PO CAPS: 300.0000 mg | ORAL_CAPSULE | Freq: Three times a day (TID) | ORAL | 1 refills | Status: DC

## 2017-01-14 MED ORDER — ACETAMINOPHEN 500 MG PO TABS: 1000.0000 mg | ORAL_TABLET | Freq: Three times a day (TID) | ORAL | 0 refills | Status: DC | PRN

## 2017-01-14 NOTE — Patient Instructions (Addendum)
[name] it was nice meeting you today.  -Take gabapentin 300 mg 3 times a day as instructed. This medication may make you drowsy so do not drive or operate heavy machinery.  -Take Tylenol 1000 mg every 8 hours as needed for pain  -Take Robaxin 500 mg at bedtime as needed for muscle spasms. Do not take this medication during the day as it may make you drowsy. Do not drive or operate heavy machinery while taking this medication.  -Return to the clinic in 1 month.

## 2017-01-14 NOTE — Assessment & Plan Note (Signed)
Patient continues to complain of chronic back and buttock pain at this visit. States his symptoms started after a fall at his job in March 2018. Reports having right-sided back muscle spasms in the thoracic and lumbar area. Also reports feeling pain in his right buttock that radiates down the back of his right leg up to knee level. It is associated with tingling in these areas. Denies having any fevers, chills, or fecal/ urinary incontinence, or saddle anesthesia. During his previous visit, his PCP had prescribed gabapentin, naproxen, and a PPI which the patient has not started taking yet because he has changed pharmacies and his new pharmacy did not have these prescriptions. States he has not been able to start physical therapy yet as it is still undergoing approval through his job. On exam, he had normal strength and intact sensation to light touch in bilateral lower extremities. Straight leg raise test negative and pain does not radiate down his entire right lower extremity making lumbar radiculopathy less likely. Patient's current presentation is likely in the setting of thoracic and lumbar paraspinal muscle spasms in addition to piriformis syndrome.  Plan -Medications have been sent to [facility] per patient request -Gabapentin 300 mg 3 times a day -Tylenol 1000 mg every 8 hours as needed. Avoid NSAIDs in the setting of history of GI bleed. -Robaxin 500 mg at bedtime as needed for muscle spasms -Heating pad  -If there is no improvement with conservative measures, consider giving a corticosteroid injection for piriformis syndrome at future visit. -Advised him to return to the clinic in 1 month. -Work note to return to work on 01/17/2017.

## 2017-01-14 NOTE — Progress Notes (Signed)
   CC: Chronic back and buttock pain  HPI:  Thomas Gardner is a 61 y.o. male with a past medical history of conditions listed below presenting to the clinic to discuss his chronic back and buttock pain. Please see problem based charting for the status of the patient's current and chronic medical conditions.   Past Medical History:  Diagnosis Date  . Arthritis   . Asthma   . CHF (congestive heart failure) ([place])   . Diabetes mellitus 2003  . GERD (gastroesophageal reflux disease)   . Hyperlipidemia   . Hypertension   . Morbid obesity ([place])   . Sleep apnea    Review of Systems: Pertinent positives mentioned in HPI. Remainder of all ROS negative.   Physical Exam:  Vitals:   01/14/17 1606  BP: (!) 141/75  Pulse: 83  Temp: 98.2 F (36.8 C)  TempSrc: Oral  SpO2: 97%  Weight: 239 lb 8 oz (108.6 kg)  Height: 5\' 6"  (1.676 m)   Physical Exam  Constitutional: He is oriented to person, place, and time. He appears well-developed and well-nourished.  Mildly distressed due to pain  HENT:  Head: Normocephalic and atraumatic.  Eyes: Right eye exhibits no discharge. Left eye exhibits no discharge.  Cardiovascular: Normal rate, regular rhythm and intact distal pulses.   Pulmonary/Chest: Effort normal and breath sounds normal. No respiratory distress. He has no wheezes.  Abdominal: Soft. Bowel sounds are normal. He exhibits no distension. There is no tenderness.  Musculoskeletal:  Straight leg raise test negative bilaterally. Right-sided thoracic and lumbar paraspinal muscles feel tight and are tender to palpation. Strength 5 out of 5 and sensation to light touch intact in bilateral lower extremities.  Neurological: He is alert and oriented to person, place, and time.  Skin: Skin is warm and dry.  Psychiatric: His behavior is normal.    Assessment & Plan:   See Encounters Tab for problem based charting.  Patient discussed with Dr. Angelia Mould

## 2017-01-20 NOTE — Progress Notes (Signed)
Internal Medicine Clinic Attending  Case discussed with Dr. Rathoreat the time of the visit. We reviewed the resident's history and exam and pertinent patient test results. I agree with the assessment, diagnosis, and plan of care documented in the resident's note.  

## 2017-01-21 ENCOUNTER — Telehealth: Payer: Self-pay | Admitting: Physical Therapy

## 2017-01-21 NOTE — Telephone Encounter (Signed)
01/01/17 spoke with patient, he states this is WC related. Patient was told he needs to provide necessary WC info for auth for PT. No return call

## 2017-02-03 MED FILL — LOSARTAN POTASSIUM 50 MG TA: 50 | 30 days supply | Qty: 30 | Fill #1

## 2017-02-10 NOTE — Addendum Note (Signed)
Addended by: Hulan Fray on: 02/10/2017 08:10 PM   Modules accepted: Orders

## 2017-02-17 MED FILL — CHLORTHALIDONE 25 MG TABLET: 25 | 30 days supply | Qty: 30 | Fill #1

## 2017-03-03 MED FILL — LOSARTAN POTASSIUM 50 MG TA: 50 | 30 days supply | Qty: 30 | Fill #2

## 2017-03-03 MED FILL — CARVEDILOL 25 MG TABLET: 25 | 30 days supply | Qty: 60 | Fill #1

## 2017-03-21 MED FILL — CHLORTHALIDONE 25 MG TABLET: 25 | 30 days supply | Qty: 30 | Fill #2

## 2017-03-30 ENCOUNTER — Other Ambulatory Visit: Payer: Self-pay | Admitting: Internal Medicine

## 2017-03-30 DIAGNOSIS — E1165 Type 2 diabetes mellitus with hyperglycemia: Principal | ICD-10-CM

## 2017-03-30 DIAGNOSIS — IMO0001 Reserved for inherently not codable concepts without codable children: Secondary | ICD-10-CM

## 2017-03-31 ENCOUNTER — Other Ambulatory Visit: Payer: Self-pay | Admitting: Internal Medicine

## 2017-03-31 DIAGNOSIS — I509 Heart failure, unspecified: Secondary | ICD-10-CM

## 2017-04-04 MED FILL — CARVEDILOL 25 MG TABLET: 25 | 30 days supply | Qty: 60 | Fill #0

## 2017-04-14 MED FILL — CHLORTHALIDONE 25 MG TABS: 25 | 30 days supply | Qty: 30 | Fill #3

## 2017-04-14 MED FILL — LOSARTAN POTASSIUM 50 MG TA: 50 | 30 days supply | Qty: 30 | Fill #3

## 2017-05-05 ENCOUNTER — Other Ambulatory Visit: Payer: Self-pay | Admitting: Internal Medicine

## 2017-05-05 ENCOUNTER — Ambulatory Visit (INDEPENDENT_AMBULATORY_CARE_PROVIDER_SITE_OTHER): Payer: Self-pay | Admitting: Internal Medicine

## 2017-05-05 ENCOUNTER — Other Ambulatory Visit: Payer: Self-pay

## 2017-05-05 ENCOUNTER — Encounter: Payer: Self-pay | Admitting: Internal Medicine

## 2017-05-05 VITALS — BP 122/69 | HR 79 | Temp 98.0°F | Ht 66.0 in | Wt 239.2 lb

## 2017-05-05 DIAGNOSIS — E118 Type 2 diabetes mellitus with unspecified complications: Secondary | ICD-10-CM

## 2017-05-05 DIAGNOSIS — Z79899 Other long term (current) drug therapy: Secondary | ICD-10-CM

## 2017-05-05 DIAGNOSIS — M5387 Other specified dorsopathies, lumbosacral region: Secondary | ICD-10-CM

## 2017-05-05 DIAGNOSIS — Z8719 Personal history of other diseases of the digestive system: Secondary | ICD-10-CM

## 2017-05-05 DIAGNOSIS — I509 Heart failure, unspecified: Secondary | ICD-10-CM

## 2017-05-05 DIAGNOSIS — M5441 Lumbago with sciatica, right side: Secondary | ICD-10-CM

## 2017-05-05 DIAGNOSIS — M5417 Radiculopathy, lumbosacral region: Secondary | ICD-10-CM

## 2017-05-05 MED ORDER — NAPROXEN 500 MG PO TABS: 500.0000 mg | ORAL_TABLET | Freq: Two times a day (BID) | ORAL | 0 refills | Status: DC

## 2017-05-05 MED ORDER — PANTOPRAZOLE SODIUM 40 MG PO TBEC: 40.0000 mg | DELAYED_RELEASE_TABLET | Freq: Every day | ORAL | 0 refills | Status: DC

## 2017-05-05 MED FILL — CARVEDILOL 25 MG TABLET: 25 | 30 days supply | Qty: 60 | Fill #1

## 2017-05-05 MED FILL — CHLORTHALIDONE 25 MG TABS: 25 | 30 days supply | Qty: 30 | Fill #0

## 2017-05-05 NOTE — Assessment & Plan Note (Signed)
Assessment: Patient continues to experience pain consistent with lumbar radiculopathy and sciatica despite multiple attempts at conservative treatment.  Fortunately, he does not have any alarm features to consider emergent imaging, but given that his symptoms have continued for greater than 6 months I believe that more advanced imaging is warranted at this time.  In March 2018, lumbar spine x-rays were negative for acute pathology.  I think it would be reasonable to treat him with a short course of Naproxen to see if this improves things.  Given his GI bleed history, this should be taken with a PPI.  His diabetes is not well-controlled so I think this is probably a better option than systemic steroids to treat his acute inflammation.  On exam, he has tenderness to palpation over his paraspinal muscles of thoracic and lumbar spine as well as his piriformis.  His straight leg raise is positive on the right and strength exam is limited due to pain.  Plan: - MRI lumbar spine w/o contrast ordered today. - Advised patient to take Naproxen 500mg  BID x 14 days along with PPI - Advised patient to titrate up his Gabapentin as tolerated to dose of 300-900mg  TID - Continue Tylenol as needed - Continue heating pad as needed - Recommended purchase of OTC Salonpas patches - Discontinue Robaxin due to sedation - RTC in 1 month for continued follow up

## 2017-05-05 NOTE — Patient Instructions (Signed)
FOLLOW-UP INSTRUCTIONS When: in about 1 month or sooner if needed For: low back and hip pain What to bring: medications  We are going to do a few things to help your back pain: 1. I have ordered an MRI of your back due to your prolonged symptoms 2. For the next 2 weeks, take Naproxen 500mg  twice per day along with Protonix 3. Increase your Gabapentin as you are able to tolerate.  You can take 300-900 mg two times per day 4. Stop taking Robaxin since it is making you sleepy 5. At the pharmacy, pick up Salonpas patches to wear at night.

## 2017-05-05 NOTE — Progress Notes (Signed)
CC: here for continued back pain  HPI:  Thomas Gardner is a 61 y.o. man with PMHx as below here today for continued low back pain that started several months ago after a fall at work.  He has been evaluated in clinic on several occasions with continued symptoms despite conservative therapy.  He has been taking Tylenol and Gabapentin regularly.  He takes Robaxin as needed but reports that it makes him overly sedated.  He was doing massage therapy but this has become cost prohibitive.  He has been referred to PT but was unable to get this approved through his workers compensation.  He was also prescribed Naproxen with a PPI in the past, but did not take this after being told not to by separate provider due to history of GI bleed.    He comes in today because over the past few weeks his symptoms have been flaring up at night, lasting for 30-60 minutes.  He continues to describe right sided hip and low back pain with radiation in to his feet.  This morning he woke up with this same pain, but it has continued and is constant.  He further describes the pain as aching and throbbing.  There has been no alarm features such as overt weakness or incontinence.    Past Medical History:  Diagnosis Date  . Arthritis   . Asthma   . CHF (congestive heart failure) ([place])   . Diabetes mellitus 2003  . GERD (gastroesophageal reflux disease)   . Hyperlipidemia   . Hypertension   . Morbid obesity ([place])   . Sleep apnea    Review of Systems:  Please see pertinent ROS reviewed in HPI and problem based charting.   Physical Exam:  Vitals:   05/05/17 1514  BP: 122/69  Pulse: 79  Temp: 98 F (36.7 C)  TempSrc: Oral  SpO2: 99%  Weight: 239 lb 3.2 oz (108.5 kg)  Height: 5\' 6"  (1.676 m)   Physical Exam  Constitutional: He is well-developed, well-nourished, and in no distress.  HENT:  Head: Normocephalic and atraumatic.  Pulmonary/Chest: Effort normal.  Musculoskeletal: He exhibits tenderness. He  exhibits no edema or deformity.  He has tenderness to palpation of his lumbar and thoracic paraspinal muscles. He has tenderness over his right gluteal and piriformis region. He has a positive straight leg raise test on the right. His strength with active hip flexion is diminished due to pain.  He has decreased ROM due to pain.  Skin: Skin is warm and dry.  Psychiatric: Mood and affect normal.     Assessment & Plan:   See Encounters Tab for problem based charting.  Patient discussed with Dr. Lynnae January.  Sciatica associated with disorder of lumbosacral spine Assessment: Patient continues to experience pain consistent with lumbar radiculopathy and sciatica despite multiple attempts at conservative treatment.  Fortunately, he does not have any alarm features to consider emergent imaging, but given that his symptoms have continued for greater than 6 months I believe that more advanced imaging is warranted at this time.  In March 2018, lumbar spine x-rays were negative for acute pathology.  I think it would be reasonable to treat him with a short course of Naproxen to see if this improves things.  Given his GI bleed history, this should be taken with a PPI.  His diabetes is not well-controlled so I think this is probably a better option than systemic steroids to treat his acute inflammation.  On exam, he has tenderness  to palpation over his paraspinal muscles of thoracic and lumbar spine as well as his piriformis.  His straight leg raise is positive on the right and strength exam is limited due to pain.  Plan: - MRI lumbar spine w/o contrast ordered today. - Advised patient to take Naproxen 500mg  BID x 14 days along with PPI - Advised patient to titrate up his Gabapentin as tolerated to dose of 300-900mg  TID - Continue Tylenol as needed - Continue heating pad as needed - Recommended purchase of OTC Salonpas patches - Discontinue Robaxin due to sedation - RTC in 1 month for continued follow up

## 2017-05-06 NOTE — Progress Notes (Signed)
Internal Medicine Clinic Attending  Case discussed with Dr. Wallace at the time of the visit.  We reviewed the resident's history and exam and pertinent patient test results.  I agree with the assessment, diagnosis, and plan of care documented in the resident's note.  

## 2017-05-15 ENCOUNTER — Other Ambulatory Visit: Payer: Self-pay | Admitting: *Deleted

## 2017-05-15 DIAGNOSIS — J4520 Mild intermittent asthma, uncomplicated: Secondary | ICD-10-CM

## 2017-05-15 MED ORDER — ALBUTEROL SULFATE HFA 108 (90 BASE) MCG/ACT IN AERS: 2.0000 | INHALATION_SPRAY | Freq: Four times a day (QID) | RESPIRATORY_TRACT | 2 refills | Status: DC | PRN

## 2017-05-16 ENCOUNTER — Other Ambulatory Visit: Payer: Self-pay | Admitting: *Deleted

## 2017-05-16 DIAGNOSIS — E1165 Type 2 diabetes mellitus with hyperglycemia: Principal | ICD-10-CM

## 2017-05-16 DIAGNOSIS — IMO0001 Reserved for inherently not codable concepts without codable children: Secondary | ICD-10-CM

## 2017-05-16 MED ORDER — LIRAGLUTIDE 18 MG/3ML ~~LOC~~ SOPN: 1.2000 mg | PEN_INJECTOR | Freq: Every day | SUBCUTANEOUS | 11 refills | Status: DC

## 2017-05-29 ENCOUNTER — Ambulatory Visit (HOSPITAL_COMMUNITY): Payer: Self-pay | Attending: Internal Medicine

## 2017-06-02 ENCOUNTER — Other Ambulatory Visit: Payer: Self-pay | Admitting: Internal Medicine

## 2017-06-02 DIAGNOSIS — I509 Heart failure, unspecified: Secondary | ICD-10-CM

## 2017-06-02 MED FILL — CARVEDILOL 25 MG TABLET: 25 | 30 days supply | Qty: 60 | Fill #0

## 2017-06-17 ENCOUNTER — Other Ambulatory Visit: Payer: Self-pay | Admitting: Internal Medicine

## 2017-06-17 DIAGNOSIS — I10 Essential (primary) hypertension: Secondary | ICD-10-CM

## 2017-06-17 DIAGNOSIS — E1165 Type 2 diabetes mellitus with hyperglycemia: Principal | ICD-10-CM

## 2017-06-17 DIAGNOSIS — IMO0001 Reserved for inherently not codable concepts without codable children: Secondary | ICD-10-CM

## 2017-06-17 MED FILL — CHLORTHALIDONE 25 MG TAB: 25 | 30 days supply | Qty: 30 | Fill #1

## 2017-06-18 MED FILL — LOSARTAN POTASSIUM 50 MG TA: 50 | 30 days supply | Qty: 30 | Fill #0

## 2017-07-04 MED FILL — CARVEDILOL 25 MG TABLET: 25 | 30 days supply | Qty: 60 | Fill #1

## 2017-07-18 MED FILL — CHLORTHALIDONE 25 MG TAB: 25 | 30 days supply | Qty: 30 | Fill #2

## 2017-07-18 MED FILL — LOSARTAN POTASSIUM 50 MG TA: 50 | 30 days supply | Qty: 30 | Fill #1

## 2017-08-07 ENCOUNTER — Other Ambulatory Visit: Payer: Self-pay | Admitting: *Deleted

## 2017-08-07 DIAGNOSIS — J4520 Mild intermittent asthma, uncomplicated: Secondary | ICD-10-CM

## 2017-08-08 MED ORDER — ALBUTEROL SULFATE HFA 108 (90 BASE) MCG/ACT IN AERS: 2.0000 | INHALATION_SPRAY | Freq: Four times a day (QID) | RESPIRATORY_TRACT | 1 refills | Status: DC | PRN

## 2017-08-08 MED FILL — CARVEDILOL 25 MG TABLET: 25 | 30 days supply | Qty: 60 | Fill #2

## 2017-08-21 MED FILL — CHLORTHALIDONE 25 MG TAB: 25 | 30 days supply | Qty: 30 | Fill #3

## 2017-08-21 MED FILL — LOSARTAN POTASSIUM 50 MG TA: 50 | 30 days supply | Qty: 30 | Fill #2

## 2017-08-28 ENCOUNTER — Encounter: Payer: Self-pay | Admitting: Internal Medicine

## 2017-08-28 ENCOUNTER — Other Ambulatory Visit: Payer: Self-pay

## 2017-08-28 ENCOUNTER — Ambulatory Visit (INDEPENDENT_AMBULATORY_CARE_PROVIDER_SITE_OTHER): Payer: Self-pay | Admitting: Internal Medicine

## 2017-08-28 VITALS — BP 104/59 | HR 87 | Temp 97.9°F | Ht 66.0 in | Wt 242.8 lb

## 2017-08-28 DIAGNOSIS — Z8719 Personal history of other diseases of the digestive system: Secondary | ICD-10-CM

## 2017-08-28 DIAGNOSIS — I10 Essential (primary) hypertension: Secondary | ICD-10-CM

## 2017-08-28 DIAGNOSIS — E785 Hyperlipidemia, unspecified: Secondary | ICD-10-CM

## 2017-08-28 DIAGNOSIS — E119 Type 2 diabetes mellitus without complications: Secondary | ICD-10-CM

## 2017-08-28 DIAGNOSIS — F329 Major depressive disorder, single episode, unspecified: Secondary | ICD-10-CM

## 2017-08-28 DIAGNOSIS — Z79899 Other long term (current) drug therapy: Secondary | ICD-10-CM

## 2017-08-28 DIAGNOSIS — E1165 Type 2 diabetes mellitus with hyperglycemia: Principal | ICD-10-CM

## 2017-08-28 DIAGNOSIS — K254 Chronic or unspecified gastric ulcer with hemorrhage: Secondary | ICD-10-CM

## 2017-08-28 DIAGNOSIS — Z794 Long term (current) use of insulin: Secondary | ICD-10-CM

## 2017-08-28 DIAGNOSIS — IMO0001 Reserved for inherently not codable concepts without codable children: Secondary | ICD-10-CM

## 2017-08-28 DIAGNOSIS — B351 Tinea unguium: Secondary | ICD-10-CM

## 2017-08-28 DIAGNOSIS — M549 Dorsalgia, unspecified: Secondary | ICD-10-CM

## 2017-08-28 DIAGNOSIS — R4589 Other symptoms and signs involving emotional state: Secondary | ICD-10-CM

## 2017-08-28 LAB — GLUCOSE, CAPILLARY: Glucose-Capillary: 336 mg/dL — ABNORMAL HIGH (ref 65–99)

## 2017-08-28 LAB — POCT GLYCOSYLATED HEMOGLOBIN (HGB A1C): HEMOGLOBIN A1C: 11.8

## 2017-08-28 MED ORDER — DULOXETINE HCL 30 MG PO CPEP: ORAL_CAPSULE | ORAL | 2 refills | Status: DC

## 2017-08-28 MED ORDER — INSULIN GLARGINE 100 UNIT/ML SOLOSTAR PEN: 14.0000 [IU] | PEN_INJECTOR | Freq: Two times a day (BID) | SUBCUTANEOUS | 11 refills | Status: DC

## 2017-08-28 NOTE — Assessment & Plan Note (Signed)
His last lipid panel showed very elevated cholesterol and his 10 year ASCVD risk is high.  He is prescribed Zetia and Crestor.  He has not taken his Crestor because he thinks it made him feel sick but he is also unsure if it was truly related to the Crestor.  He reports he is taking his Zetia.  He is agreeable to trying Crestor once again.  Plan: - Check lipid panel today - Continue Zetia - Resume Crestor

## 2017-08-28 NOTE — Assessment & Plan Note (Signed)
He reports depression, lack of motivation, increased fatigue, and binge eating to help his feelings lately.  He also continues to endorse multiple somatic complaints.  He frequently does not want to get out of bed, he is frustrated about not getting PT approved from his workers compensation, he is living with a friend and frustrated about not having his own place.  He is overall not able to function as well as he would like due to his pain.  He did not complete the MRI that was ordered in December to better evaluate his back pain.  He is not currently and has never been on medication for depression but is interested in starting somehting.  His PHQ today is 16.  Plan: - Initiate Cymbalta 30mg  daily for 1 week, then increase to 60mg  daily - RTC 3 months to assess response - Follow PHQ

## 2017-08-28 NOTE — Assessment & Plan Note (Signed)
Lab Results  Component Value Date   HGBA1C 11.8 08/28/2017    His A1c has deteriorated since it was last checked.  He reports adherence to Lantus 12 units BID, Invokana 100mg  daily, and Victoza 1.2mg  daily.  He reports checking his CBGs twice per day but did not bring his meter today.  Normal morning sugars are about 220-230 and evening sugars are even higher.  He states his biggest obstacle right now to glycemic control is his binge eating that centers around his worsening depression.  Plan: - He needs refills on his medications today.  Will reach out to our pharmacist to see about how to best assist with this. - Will increase his Lantus to 14 units BID - Will check BMET today.  Assuming GFR still greater than 60 will plan to increase his Invokana or switch to other SGLT-2 inhibitor - Will continue Victoza 1.2 mg daily - Foot exam done today - Needs eye exam.  Machine in our clinic is down but should be up and running at his follow up - RTC 3 months

## 2017-08-28 NOTE — Progress Notes (Signed)
CC: f/u depression, HTN, DM, HLD  HPI:  [name] is a 62 y.o. man with a past medical history listed below here today for follow up of his HTN, HLD, and DM.   For details of today's visit and the status of his chronic medical issues please refer to the assessment and plan.   Past Medical History:  Diagnosis Date  . Arthritis   . Asthma   . CHF (congestive heart failure) ([place])   . Diabetes mellitus 2003  . GERD (gastroesophageal reflux disease)   . Hyperlipidemia   . Hypertension   . Morbid obesity ([place])   . Sleep apnea    Review of Systems:  Review of Systems  Constitutional: Negative for chills and fever.  Musculoskeletal: Positive for back pain and joint pain. Negative for myalgias.  Neurological: Negative for weakness.  Psychiatric/Behavioral: Positive for depression. Negative for suicidal ideas. The patient is not nervous/anxious.      Physical Exam:  Vitals:   08/28/17 1601  BP: (!) 104/59  Pulse: 87  Temp: 97.9 F (36.6 C)  TempSrc: Oral  SpO2: 99%  Weight: 242 lb 12.8 oz (110.1 kg)  Height: 5\' 6"  (1.676 m)   Physical Exam  Constitutional: He is oriented to person, place, and time and well-developed, well-nourished, and in no distress.  HENT:  Head: Normocephalic and atraumatic.  Cardiovascular: Normal rate, regular rhythm and intact distal pulses.  Intact DP and PT pulses bilaterally.   Pulmonary/Chest: Effort normal and breath sounds normal.  Musculoskeletal: He exhibits no edema.  Neurological: He is alert and oriented to person, place, and time.  Skin: Skin is warm and dry.  Feet are warm, without ulceration.  There is evidence of bilateral onychomycosis on his great toes.   Psychiatric:  Mood is depressed with flattened affect.      Assessment & Plan:   See Encounters Tab for problem based charting.  Patient discussed with Dr. Angelia Mould.  Essential hypertension BP Readings from Last 3 Encounters:  08/28/17 (!) 104/59  05/05/17  122/69  01/14/17 (!) 141/75   His BP today is well controlled on current 3 drug regimen.  He has not been experiencing any side effects.  Plan: - Continue Coreg 25mg  BID, Chlorthalidone 25mg  daily, and Losartan 50mg  daily - BMET today  GI bleed He has a history of GI bleed and reports when taking Naproxen for 2 weeks in December that he had some bleeding.  This has since resolved and he reports no continued bleeding and exhibits no signs or symptoms of blood loss.  Plan: - Will check a CBC today.  - Advised patient to avoid NSAIDs in the future based on his history and report.   Diabetes mellitus type 2, uncontrolled, without complications ([place]) Lab Results  Component Value Date   HGBA1C 11.8 08/28/2017    His A1c has deteriorated since it was last checked.  He reports adherence to Lantus 12 units BID, Invokana 100mg  daily, and Victoza 1.2mg  daily.  He reports checking his CBGs twice per day but did not bring his meter today.  Normal morning sugars are about 220-230 and evening sugars are even higher.  He states his biggest obstacle right now to glycemic control is his binge eating that centers around his worsening depression.  Plan: - He needs refills on his medications today.  Will reach out to our pharmacist to see about how to best assist with this. - Will increase his Lantus to 14 units BID -  Will check BMET today.  Assuming GFR still greater than 60 will plan to increase his Invokana or switch to other SGLT-2 inhibitor - Will continue Victoza 1.2 mg daily - Foot exam done today - Needs eye exam.  Machine in our clinic is down but should be up and running at his follow up - RTC 3 months  Hyperlipidemia His last lipid panel showed very elevated cholesterol and his 10 year ASCVD risk is high.  He is prescribed Zetia and Crestor.  He has not taken his Crestor because he thinks it made him feel sick but he is also unsure if it was truly related to the Crestor.  He reports he is  taking his Zetia.  He is agreeable to trying Crestor once again.  Plan: - Check lipid panel today - Continue Zetia - Resume Crestor  Depressed mood He reports depression, lack of motivation, increased fatigue, and binge eating to help his feelings lately.  He also continues to endorse multiple somatic complaints.  He frequently does not want to get out of bed, he is frustrated about not getting PT approved from his workers compensation, he is living with a friend and frustrated about not having his own place.  He is overall not able to function as well as he would like due to his pain.  He did not complete the MRI that was ordered in December to better evaluate his back pain.  He is not currently and has never been on medication for depression but is interested in starting somehting.  His PHQ today is 16.  Plan: - Initiate Cymbalta 30mg  daily for 1 week, then increase to 60mg  daily - RTC 3 months to assess response - Follow PHQ

## 2017-08-28 NOTE — Assessment & Plan Note (Signed)
BP Readings from Last 3 Encounters:  08/28/17 (!) 104/59  05/05/17 122/69  01/14/17 (!) 141/75   His BP today is well controlled on current 3 drug regimen.  He has not been experiencing any side effects.  Plan: - Continue Coreg 25mg  BID, Chlorthalidone 25mg  daily, and Losartan 50mg  daily - BMET today

## 2017-08-28 NOTE — Patient Instructions (Signed)
Thank you for coming to see me today. It was a pleasure. Today we talked about:   Depression and pain: we will start Cymbalta 30mg  daily for 1 week, then increase to 60mg  daily afterwards.  We will reassess how you are doing in about 3 months  Diabetes: - Increase your Lantus to 14mg  twice per day - I will talk with Dr Maudie Mercury about your other refills   Please follow-up with me in 3 months  If you have any questions or concerns, please do not hesitate to call the office at [phone].  Take Care,   Jule Ser, DO

## 2017-08-28 NOTE — Assessment & Plan Note (Signed)
He has a history of GI bleed and reports when taking Naproxen for 2 weeks in December that he had some bleeding.  This has since resolved and he reports no continued bleeding and exhibits no signs or symptoms of blood loss.  Plan: - Will check a CBC today.  - Advised patient to avoid NSAIDs in the future based on his history and report.

## 2017-08-29 ENCOUNTER — Other Ambulatory Visit: Payer: Self-pay | Admitting: Pharmacist

## 2017-08-29 DIAGNOSIS — IMO0001 Reserved for inherently not codable concepts without codable children: Secondary | ICD-10-CM

## 2017-08-29 DIAGNOSIS — Z794 Long term (current) use of insulin: Secondary | ICD-10-CM

## 2017-08-29 DIAGNOSIS — R4589 Other symptoms and signs involving emotional state: Secondary | ICD-10-CM

## 2017-08-29 DIAGNOSIS — I10 Essential (primary) hypertension: Secondary | ICD-10-CM

## 2017-08-29 DIAGNOSIS — I509 Heart failure, unspecified: Secondary | ICD-10-CM

## 2017-08-29 DIAGNOSIS — E1165 Type 2 diabetes mellitus with hyperglycemia: Secondary | ICD-10-CM

## 2017-08-29 DIAGNOSIS — F329 Major depressive disorder, single episode, unspecified: Secondary | ICD-10-CM

## 2017-08-29 LAB — BMP8+ANION GAP
ANION GAP: 16 mmol/L (ref 10.0–18.0)
BUN/Creatinine Ratio: 23 (ref 10–24)
BUN: 26 mg/dL (ref 8–27)
CALCIUM: 9.8 mg/dL (ref 8.6–10.2)
CHLORIDE: 95 mmol/L — AB (ref 96–106)
CO2: 23 mmol/L (ref 20–29)
Creatinine, Ser: 1.12 mg/dL (ref 0.76–1.27)
GFR calc Af Amer: 81 mL/min/{1.73_m2} (ref >59)
GFR, EST NON AFRICAN AMERICAN: 70 mL/min/{1.73_m2} (ref >59)
GLUCOSE: 304 mg/dL — AB (ref 65–99)
POTASSIUM: 3.6 mmol/L (ref 3.5–5.2)
SODIUM: 134 mmol/L (ref 134–144)

## 2017-08-29 LAB — CBC
HEMOGLOBIN: 14.4 g/dL (ref 13.0–17.7)
Hematocrit: 41.5 % (ref 37.5–51.0)
MCH: 27.5 pg (ref 26.6–33.0)
MCHC: 34.7 g/dL (ref 31.5–35.7)
MCV: 79 fL (ref 79–97)
PLATELETS: 183 10*3/uL (ref 150–379)
RBC: 5.23 x10E6/uL (ref 4.14–5.80)
RDW: 15 % (ref 12.3–15.4)
WBC: 8.7 10*3/uL (ref 3.4–10.8)

## 2017-08-29 LAB — LIPID PANEL
CHOLESTEROL TOTAL: 311 mg/dL — AB (ref 100–199)
Chol/HDL Ratio: 9.7 ratio — ABNORMAL HIGH (ref 0.0–5.0)
HDL: 32 mg/dL — ABNORMAL LOW (ref >39)
TRIGLYCERIDES: 484 mg/dL — AB (ref 0–149)

## 2017-09-01 NOTE — Progress Notes (Signed)
Internal Medicine Clinic Attending  Case discussed with Dr. Wallace at the time of the visit.  We reviewed the resident's history and exam and pertinent patient test results.  I agree with the assessment, diagnosis, and plan of care documented in the resident's note.  

## 2017-09-05 ENCOUNTER — Other Ambulatory Visit: Payer: Self-pay | Admitting: Internal Medicine

## 2017-09-05 DIAGNOSIS — I509 Heart failure, unspecified: Secondary | ICD-10-CM

## 2017-09-05 MED FILL — CARVEDILOL 25 MG TABLET: 25 | 30 days supply | Qty: 60 | Fill #3

## 2017-09-08 MED ORDER — LIRAGLUTIDE 18 MG/3ML ~~LOC~~ SOPN
1.2000 mg | PEN_INJECTOR | Freq: Every day | SUBCUTANEOUS | 11 refills | Status: DC
Start: 2017-09-08 — End: 2018-04-06

## 2017-09-08 MED ORDER — CANAGLIFLOZIN 100 MG PO TABS: 100.0000 mg | ORAL_TABLET | Freq: Every day | ORAL | 1 refills | Status: DC

## 2017-09-08 MED ORDER — CARVEDILOL 25 MG PO TABS: ORAL_TABLET | ORAL | 3 refills | Status: DC

## 2017-09-08 MED ORDER — LOSARTAN POTASSIUM 50 MG PO TABS: 50.0000 mg | ORAL_TABLET | Freq: Every day | ORAL | 3 refills | Status: DC

## 2017-09-08 MED ORDER — INSULIN GLARGINE 100 UNIT/ML SOLOSTAR PEN: 14.0000 [IU] | PEN_INJECTOR | Freq: Two times a day (BID) | SUBCUTANEOUS | 11 refills | Status: DC

## 2017-09-08 MED ORDER — DULOXETINE HCL 30 MG PO CPEP: ORAL_CAPSULE | ORAL | 2 refills | Status: DC

## 2017-09-08 MED ORDER — EZETIMIBE 10 MG PO TABS: 10.0000 mg | ORAL_TABLET | Freq: Every day | ORAL | 3 refills | Status: DC

## 2017-09-08 MED ORDER — CHLORTHALIDONE 25 MG PO TABS: 25.0000 mg | ORAL_TABLET | Freq: Every day | ORAL | 3 refills | Status: DC

## 2017-09-08 MED ORDER — ROSUVASTATIN CALCIUM 10 MG PO TABS: 10.0000 mg | ORAL_TABLET | Freq: Every day | ORAL | 3 refills | Status: DC

## 2017-09-08 MED FILL — DULoxetine HCL 30 MG CPEP: 30 | 19 days supply | Qty: 30 | Fill #0

## 2017-09-08 MED FILL — LOSARTAN POTASSIUM 50 MG TA: 50 | 30 days supply | Qty: 30 | Fill #0

## 2017-09-08 MED FILL — ROSUVASTATIN CALCIUM 10 MG: 10 | 30 days supply | Qty: 30 | Fill #0

## 2017-09-08 NOTE — Progress Notes (Signed)
Re-sent chlorthalidone under IM program

## 2017-09-08 NOTE — Addendum Note (Signed)
Addended by: Forde Dandy on: 09/08/2017 11:04 AM   Modules accepted: Orders

## 2017-09-08 NOTE — Progress Notes (Signed)
PCP and patient requested prescription transfers to current pharmacies [facility] outpatient and [facility]) from [place]. Prescriptions sent.

## 2017-09-19 MED FILL — CHLORTHALIDONE 25 MG TAB: 25 | 30 days supply | Qty: 30 | Fill #0

## 2017-10-04 ENCOUNTER — Other Ambulatory Visit: Payer: Self-pay | Admitting: Internal Medicine

## 2017-10-04 DIAGNOSIS — E1165 Type 2 diabetes mellitus with hyperglycemia: Principal | ICD-10-CM

## 2017-10-04 DIAGNOSIS — IMO0001 Reserved for inherently not codable concepts without codable children: Secondary | ICD-10-CM

## 2017-10-06 NOTE — Telephone Encounter (Signed)
Thomas with [facility] requesting a refill on   canagliflozin (INVOKANA) 100 MG TABS tablet.please call back.

## 2017-10-08 MED FILL — CARVEDILOL 25 MG TABLET: 25 | 30 days supply | Qty: 60 | Fill #0

## 2017-10-08 MED FILL — CHLORTHALIDONE 25 MG TAB: 25 | 30 days supply | Qty: 30 | Fill #1

## 2017-10-08 MED FILL — LOSARTAN POTASSIUM 50 MG TA: 50 | 30 days supply | Qty: 30 | Fill #1

## 2017-10-16 ENCOUNTER — Other Ambulatory Visit: Payer: Self-pay | Admitting: Pharmacist

## 2017-10-16 DIAGNOSIS — IMO0001 Reserved for inherently not codable concepts without codable children: Secondary | ICD-10-CM

## 2017-10-16 DIAGNOSIS — E1165 Type 2 diabetes mellitus with hyperglycemia: Principal | ICD-10-CM

## 2017-10-16 MED ORDER — CANAGLIFLOZIN 100 MG PO TABS: 100.0000 mg | ORAL_TABLET | Freq: Every day | ORAL | 3 refills | Status: DC

## 2017-10-16 NOTE — Progress Notes (Signed)
Transferred canagliflozin prescription to [facility] for free medication access program.

## 2017-11-07 MED FILL — CHLORTHALIDONE 25 MG TAB: 25 | 30 days supply | Qty: 30 | Fill #2

## 2017-11-07 MED FILL — LOSARTAN POTASSIUM 50 MG TA: 50 | 30 days supply | Qty: 30 | Fill #2

## 2017-11-07 MED FILL — CARVEDILOL 25 MG TABLET: 25 | 30 days supply | Qty: 60 | Fill #1

## 2017-11-15 ENCOUNTER — Encounter: Payer: Self-pay | Admitting: *Deleted

## 2017-12-05 MED FILL — LOSARTAN POTASSIUM 50 MG TA: 50 | 30 days supply | Qty: 30 | Fill #3

## 2017-12-05 MED FILL — CHLORTHALIDONE 25 MG TAB: 25 | 30 days supply | Qty: 30 | Fill #3

## 2017-12-05 MED FILL — CARVEDILOL 25 MG TABLET: 25 | 30 days supply | Qty: 60 | Fill #2

## 2017-12-22 ENCOUNTER — Emergency Department (HOSPITAL_BASED_OUTPATIENT_CLINIC_OR_DEPARTMENT_OTHER)
Admission: EM | Admit: 2017-12-22 | Discharge: 2017-12-22 | Disposition: A | Discharge status: Home / Self Care | Payer: Self-pay | Attending: Emergency Medicine | Admitting: Emergency Medicine

## 2017-12-22 ENCOUNTER — Other Ambulatory Visit: Payer: Self-pay

## 2017-12-22 ENCOUNTER — Encounter (HOSPITAL_BASED_OUTPATIENT_CLINIC_OR_DEPARTMENT_OTHER): Payer: Self-pay

## 2017-12-22 ENCOUNTER — Emergency Department (HOSPITAL_BASED_OUTPATIENT_CLINIC_OR_DEPARTMENT_OTHER): Payer: Self-pay

## 2017-12-22 DIAGNOSIS — Z79899 Other long term (current) drug therapy: Secondary | ICD-10-CM | POA: Insufficient documentation

## 2017-12-22 DIAGNOSIS — X58XXXA Exposure to other specified factors, initial encounter: Secondary | ICD-10-CM | POA: Insufficient documentation

## 2017-12-22 DIAGNOSIS — Y9301 Activity, walking, marching and hiking: Secondary | ICD-10-CM | POA: Insufficient documentation

## 2017-12-22 DIAGNOSIS — R739 Hyperglycemia, unspecified: Secondary | ICD-10-CM

## 2017-12-22 DIAGNOSIS — S86001A Unspecified injury of right Achilles tendon, initial encounter: Secondary | ICD-10-CM | POA: Insufficient documentation

## 2017-12-22 DIAGNOSIS — I110 Hypertensive heart disease with heart failure: Secondary | ICD-10-CM | POA: Insufficient documentation

## 2017-12-22 DIAGNOSIS — Y999 Unspecified external cause status: Secondary | ICD-10-CM | POA: Insufficient documentation

## 2017-12-22 DIAGNOSIS — Y929 Unspecified place or not applicable: Secondary | ICD-10-CM | POA: Insufficient documentation

## 2017-12-22 DIAGNOSIS — E119 Type 2 diabetes mellitus without complications: Secondary | ICD-10-CM | POA: Insufficient documentation

## 2017-12-22 DIAGNOSIS — I509 Heart failure, unspecified: Secondary | ICD-10-CM | POA: Insufficient documentation

## 2017-12-22 DIAGNOSIS — J45909 Unspecified asthma, uncomplicated: Secondary | ICD-10-CM | POA: Insufficient documentation

## 2017-12-22 DIAGNOSIS — Z794 Long term (current) use of insulin: Secondary | ICD-10-CM | POA: Insufficient documentation

## 2017-12-22 LAB — CBC WITH DIFFERENTIAL/PLATELET
BASOS PCT: 0 %
Basophils Absolute: 0 10*3/uL (ref 0.0–0.1)
Eosinophils Absolute: 0.3 10*3/uL (ref 0.0–0.7)
Eosinophils Relative: 3 %
HEMATOCRIT: 43.3 % (ref 39.0–52.0)
HEMOGLOBIN: 14.5 g/dL (ref 13.0–17.0)
LYMPHS ABS: 2.6 10*3/uL (ref 0.7–4.0)
Lymphocytes Relative: 30 %
MCH: 26.7 pg (ref 26.0–34.0)
MCHC: 33.5 g/dL (ref 30.0–36.0)
MCV: 79.6 fL (ref 78.0–100.0)
MONO ABS: 0.7 10*3/uL (ref 0.1–1.0)
MONOS PCT: 8 %
NEUTROS ABS: 5 10*3/uL (ref 1.7–7.7)
Neutrophils Relative %: 59 %
Platelets: 173 10*3/uL (ref 150–400)
RBC: 5.44 MIL/uL (ref 4.22–5.81)
RDW: 14.6 % (ref 11.5–15.5)
WBC: 8.5 10*3/uL (ref 4.0–10.5)

## 2017-12-22 LAB — BASIC METABOLIC PANEL
Anion gap: 9 (ref 5–15)
BUN: 22 mg/dL (ref 8–23)
CHLORIDE: 99 mmol/L (ref 98–111)
CO2: 27 mmol/L (ref 22–32)
Calcium: 9.1 mg/dL (ref 8.9–10.3)
Creatinine, Ser: 1.17 mg/dL (ref 0.61–1.24)
Glucose, Bld: 359 mg/dL — ABNORMAL HIGH (ref 70–99)
Potassium: 3.7 mmol/L (ref 3.5–5.1)
Sodium: 135 mmol/L (ref 135–145)

## 2017-12-22 MED ORDER — ACETAMINOPHEN 500 MG PO TABS
1000.0000 mg | ORAL_TABLET | Freq: Once | ORAL | Status: AC
  Administered 2017-12-22: 1000 mg via ORAL
  Filled 2017-12-22: qty 2

## 2017-12-22 NOTE — ED Provider Notes (Signed)
[facility] EMERGENCY DEPARTMENT Provider Note   CSN: [id number] Arrival date & time: 12/22/17  1309     History   Chief Complaint Chief Complaint  Patient presents with  . Ankle Injury    HPI Thomas [facility] is a 62 y.o. male.  Patient is a 62 year old male with a history of hypertension, hyperlipidemia, diabetes, CHF and asthma presenting today after a mechanical fall on Thursday evening.  He was walking up steps and is not quite sure what happened but he fell forward hitting his right toe and landing on his left side.  The next morning when he woke up he had significant pain in the back of his right foot making it difficult to walk.  It is not improved so he came for further evaluation.  His right great toe starting to feel better but he has noticed its been bruised.  The history is provided by the patient.  Ankle Injury  This is a new problem. Episode onset: 4 days ago. The problem occurs constantly. The problem has not changed since onset.Associated symptoms comments: Pain in the back of the right foot.  Also right great toe sore.  . The symptoms are aggravated by walking and bending. The symptoms are relieved by rest. Treatments tried: ice. The treatment provided mild relief.    Past Medical History:  Diagnosis Date  . Arthritis   . Asthma   . CHF (congestive heart failure) ([place])   . Diabetes mellitus 2003  . GERD (gastroesophageal reflux disease)   . Hyperlipidemia   . Hypertension   . Morbid obesity ([place])   . Sleep apnea     Patient Active Problem List   Diagnosis Date Noted  . Rib pain on right side 08/07/2016  . Sciatica associated with disorder of lumbosacral spine 08/07/2016  . Leg cramping 07/11/2016  . Sleep apnea 02/01/2016  . Coccyalgia 01/19/2016  . Depressed mood 01/19/2016  . 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 Healthcare maintenance 03/17/2015  . Gout 08/08/2014  . Hyperlipidemia 06/15/2014  . GI bleed 05/03/2014  . Congestive  heart failure ([place]) 05/03/2014  . Diabetes mellitus type 2, uncontrolled, without complications ([place]) 40/98/1191  . Essential hypertension 05/03/2014  . Morbid obesity ([place]) 10/10/2011    Past Surgical History:  Procedure Laterality Date  . BREATH TEK H PYLORI  11/11/2011   Procedure: BREATH TEK H PYLORI;  Surgeon: Shann Medal, MD;  Location: [facility];  Service: General;  Laterality: N/A;  . CARDIAC CATHETERIZATION    . ESOPHAGOGASTRODUODENOSCOPY (EGD) WITH PROPOFOL N/A 05/04/2014   Procedure: ESOPHAGOGASTRODUODENOSCOPY (EGD) WITH PROPOFOL;  Surgeon: Cleotis Nipper, MD;  Location: [facility];  Service: Endoscopy;  Laterality: N/A;        Home Medications    Prior to Admission medications   Medication Sig Start Date End Date Taking? Authorizing Provider  acetaminophen (TYLENOL) 500 MG tablet Take 2 tablets (1,000 mg total) by mouth every 8 (eight) hours as needed. IM program. 01/14/17 01/14/18  Shela Leff, MD  albuterol (PROVENTIL HFA;VENTOLIN HFA) 108 (90 Base) MCG/ACT inhaler Inhale 2 puffs into the lungs every 6 (six) hours as needed for wheezing or shortness of breath. [facility] 08/08/17   Jule Ser, DO  canagliflozin (INVOKANA) 100 MG TABS tablet Take 1 tablet (100 mg total) by mouth daily. 10/16/17   Jule Ser, DO  carvedilol (COREG) 25 MG tablet TAKE 1 TABLET BY MOUTH 2 TIMES DAILY WITH A MEAL. IM program 09/08/17   Jule Ser, DO  chlorthalidone (HYGROTON) 25 MG tablet Take 1 tablet (25 mg total) by mouth daily. IM program 09/08/17   Jule Ser, DO  DULoxetine (CYMBALTA) 30 MG capsule Take 30mg  daily for 1 week, then increase to 60mg  daily after that. IM program 09/08/17   Jule Ser, DO  ezetimibe (ZETIA) 10 MG tablet Take 1 tablet (10 mg total) by mouth daily. [facility] 09/08/17   Jule Ser, DO  Insulin Glargine (LANTUS) 100 UNIT/ML Solostar Pen Inject 14 Units into the skin 2 (two) times daily. Get it from the [facility]  09/08/17   Jule Ser, DO  Insulin Pen Needle (B-D UF III MINI PEN NEEDLES) 31G X 5 MM MISC Use to inject lantus daily 11/02/15   Ahmed, Chesley Mires, MD  liraglutide (VICTOZA) 18 MG/3ML SOPN Inject 0.2 mLs (1.2 mg total) into the skin daily. [facility] 09/08/17   Jule Ser, DO  losartan (COZAAR) 50 MG tablet Take 1 tablet (50 mg total) by mouth daily. IM program 09/08/17   Jule Ser, DO  rosuvastatin (CRESTOR) 10 MG tablet Take 1 tablet (10 mg total) by mouth daily. IM program, pick up 09/08/17   Jule Ser, DO    Family History Family History  Problem Relation Age of Onset  . Cancer Mother        ovarian  . Diabetes Mother   . Stroke Father   . Cancer Maternal Aunt        breast  . Diabetes Maternal Aunt   . Cancer Maternal Uncle        colon    Social History Social History   Tobacco Use  . Smoking status: Never Smoker  . Smokeless tobacco: Never Used  Substance Use Topics  . Alcohol use: No    Alcohol/week: 0.0 oz  . Drug use: No     Allergies   Erythromycin   Review of Systems Review of Systems  Musculoskeletal:       Muscle cramps intermittently in bilateral lower extremities over the last week  All other systems reviewed and are negative.    Physical Exam Updated Vital Signs BP 122/69 (BP Location: Right Arm)   Pulse 98   Temp 98.4 F (36.9 C) (Oral)   Resp 18   Ht 5\' 6"  (1.676 m)   Wt 113.4 kg (250 lb)   SpO2 97%   BMI 40.35 kg/m   Physical Exam  Constitutional: He is oriented to person, place, and time. He appears well-developed and well-nourished. No distress.  HENT:  Head: Normocephalic and atraumatic.  Eyes: Pupils are equal, round, and reactive to light.  Cardiovascular: Normal rate.  Pulmonary/Chest: Effort normal.  Musculoskeletal:       Right ankle: He exhibits ecchymosis. Achilles tendon exhibits pain. Achilles tendon exhibits no defect.       Feet:  Neurological: He is alert and oriented to person, place, and time.    Skin: Skin is warm and dry. Capillary refill takes less than 2 seconds.  Psychiatric: He has a normal mood and affect. His behavior is normal.  Nursing note and vitals reviewed.    ED Treatments / Results  Labs (all labs ordered are listed, but only abnormal results are displayed) Labs Reviewed  BASIC METABOLIC PANEL - Abnormal; Notable for the following components:      Result Value   Glucose, Bld 359 (*)    All other components within normal limits  CBC WITH DIFFERENTIAL/PLATELET    EKG None  Radiology Dg Ankle Complete Right  Result Date: 12/22/2017 CLINICAL DATA:  Right ankle pain after fall several days ago. EXAM: RIGHT ANKLE - COMPLETE 3+ VIEW COMPARISON:  None. FINDINGS: There is no evidence of fracture, dislocation, or joint effusion. There is no evidence of arthropathy or other focal bone abnormality. Soft tissues are unremarkable. IMPRESSION: Normal right ankle. Electronically Signed   By: Marijo Conception, M.D.   On: 12/22/2017 13:29    Procedures Procedures (including critical care time)  Medications Ordered in ED Medications - No data to display   Initial Impression / Assessment and Plan / ED Course  I have reviewed the triage vital signs and the nursing notes.  Pertinent labs & imaging results that were available during my care of the patient were reviewed by me and considered in my medical decision making (see chart for details).    Patient presenting today with persistent pain in his Achilles tendon area after a fall on Thursday.  Patient is able to walk but has to limp due to the pain.  He denies any other injury.  X-ray is within normal limits.  Concern for possible Achilles strain or tear.  It is not ruptured because he is able to flex and extend his foot.  Will place patient in a cam walker and have him follow-up in 1 to 2 weeks with Dr. Barbaraann Barthel if symptoms do not improve. Currently patient is complaining of worsening leg cramps over the last week that come  and go.  He does take multiple medications and he was concerned that his potassium may be too low.  Will recheck potassium to ensure no evidence of hypokalemia.  Patient is hemodynamically stable on exam and in no acute distress at this time.  2:34 PM Evidence of hypokalemia today but patient is hyperglycemic and that might be the cause for his cramping.  Discussed with him the need to drink plenty of fluids, maintain a diabetic diet and follow-up with PCP if blood sugars are not improving.  Final Clinical Impressions(s) / ED Diagnoses   Final diagnoses:  Injury of right Achilles tendon, initial encounter  Hyperglycemia    ED Discharge Orders    None       Blanchie Dessert, MD 12/22/17 1437

## 2017-12-22 NOTE — ED Triage Notes (Signed)
C/o pain to right ankle after a fall on steps 8/1-pt also c/o cramps to legs and requesting K+ level to be checked-NAD-to triage in w/c

## 2017-12-22 NOTE — Discharge Instructions (Addendum)
Wear Cam walker as long as you need if having pain while walking.  Ice 59min at a time 2-3 times per day.    Make sure you are drinking plenty of fluids and taking your insulin to avoid hyperglycemia which is most likely causing your leg cramps.  Follow a diabetic diet.

## 2018-01-06 MED FILL — LOSARTAN POTASSIUM 50 MG TA: 50 | 30 days supply | Qty: 30 | Fill #3

## 2018-01-06 MED FILL — CARVEDILOL 25 MG TABLET: 25 | 30 days supply | Qty: 60 | Fill #3

## 2018-01-06 MED FILL — CHLORTHALIDONE 25 MG TAB: 25 | 30 days supply | Qty: 30 | Fill #4

## 2018-02-06 ENCOUNTER — Other Ambulatory Visit: Payer: Self-pay | Admitting: Internal Medicine

## 2018-02-06 DIAGNOSIS — E1165 Type 2 diabetes mellitus with hyperglycemia: Secondary | ICD-10-CM

## 2018-02-06 DIAGNOSIS — IMO0001 Reserved for inherently not codable concepts without codable children: Secondary | ICD-10-CM

## 2018-02-06 DIAGNOSIS — I509 Heart failure, unspecified: Secondary | ICD-10-CM

## 2018-02-06 DIAGNOSIS — I10 Essential (primary) hypertension: Secondary | ICD-10-CM

## 2018-02-06 MED FILL — CHLORTHALIDONE 25 MG TAB: 25 | 30 days supply | Qty: 30 | Fill #5

## 2018-02-09 MED ORDER — LOSARTAN POTASSIUM 50 MG PO TABS: 50.0000 mg | ORAL_TABLET | Freq: Every day | ORAL | 0 refills | Status: DC

## 2018-02-09 MED ORDER — CARVEDILOL 25 MG PO TABS: ORAL_TABLET | ORAL | 3 refills | Status: DC

## 2018-02-09 MED FILL — LOSARTAN POTASSIUM 50 MG TA: 50 | 30 days supply | Qty: 30 | Fill #0

## 2018-02-09 MED FILL — CARVEDILOL 25 MG TABLET: 25 | 30 days supply | Qty: 60 | Fill #0

## 2018-03-04 ENCOUNTER — Other Ambulatory Visit: Payer: Self-pay | Admitting: *Deleted

## 2018-03-04 DIAGNOSIS — J4520 Mild intermittent asthma, uncomplicated: Secondary | ICD-10-CM

## 2018-03-04 MED ORDER — ALBUTEROL SULFATE HFA 108 (90 BASE) MCG/ACT IN AERS: 2.0000 | INHALATION_SPRAY | Freq: Four times a day (QID) | RESPIRATORY_TRACT | 1 refills | Status: DC | PRN

## 2018-03-04 NOTE — Telephone Encounter (Signed)
Next appt scheduled 10/24 with PCP. 

## 2018-03-12 ENCOUNTER — Encounter: Payer: Self-pay | Admitting: Internal Medicine

## 2018-03-12 MED FILL — CHLORTHALIDONE 25 MG TAB: 25 | 30 days supply | Qty: 30 | Fill #6

## 2018-03-12 MED FILL — LOSARTAN POTASSIUM 50 MG TA: 50 | 30 days supply | Qty: 30 | Fill #1

## 2018-03-12 MED FILL — CARVEDILOL 25 MG TABLET: 25 | 30 days supply | Qty: 60 | Fill #1

## 2018-04-06 ENCOUNTER — Ambulatory Visit (INDEPENDENT_AMBULATORY_CARE_PROVIDER_SITE_OTHER): Payer: Self-pay | Admitting: Internal Medicine

## 2018-04-06 ENCOUNTER — Encounter: Payer: Self-pay | Admitting: Internal Medicine

## 2018-04-06 VITALS — BP 132/80 | HR 81 | Temp 98.0°F | Wt 239.3 lb

## 2018-04-06 DIAGNOSIS — E118 Type 2 diabetes mellitus with unspecified complications: Secondary | ICD-10-CM

## 2018-04-06 DIAGNOSIS — Z794 Long term (current) use of insulin: Secondary | ICD-10-CM

## 2018-04-06 DIAGNOSIS — I509 Heart failure, unspecified: Secondary | ICD-10-CM

## 2018-04-06 DIAGNOSIS — E1165 Type 2 diabetes mellitus with hyperglycemia: Secondary | ICD-10-CM

## 2018-04-06 DIAGNOSIS — E785 Hyperlipidemia, unspecified: Secondary | ICD-10-CM

## 2018-04-06 DIAGNOSIS — IMO0001 Reserved for inherently not codable concepts without codable children: Secondary | ICD-10-CM

## 2018-04-06 DIAGNOSIS — Z87438 Personal history of other diseases of male genital organs: Secondary | ICD-10-CM

## 2018-04-06 DIAGNOSIS — Z79899 Other long term (current) drug therapy: Secondary | ICD-10-CM

## 2018-04-06 DIAGNOSIS — B3742 Candidal balanitis: Secondary | ICD-10-CM

## 2018-04-06 LAB — POCT GLYCOSYLATED HEMOGLOBIN (HGB A1C): HbA1c POC (<> result, manual entry): >14 % — AB (ref 4.0–5.6)

## 2018-04-06 LAB — GLUCOSE, CAPILLARY: GLUCOSE-CAPILLARY: 369 mg/dL — AB (ref 70–99)

## 2018-04-06 MED ORDER — INSULIN GLARGINE 100 UNIT/ML SOLOSTAR PEN: 28.0000 [IU] | PEN_INJECTOR | Freq: Every day | SUBCUTANEOUS | 11 refills | Status: DC

## 2018-04-06 MED ORDER — ROSUVASTATIN CALCIUM 20 MG PO TABS: 20.0000 mg | ORAL_TABLET | Freq: Every day | ORAL | 5 refills | Status: DC

## 2018-04-06 MED ORDER — LIRAGLUTIDE 18 MG/3ML ~~LOC~~ SOPN: 1.8000 mg | PEN_INJECTOR | Freq: Every day | SUBCUTANEOUS | 11 refills | Status: DC

## 2018-04-06 MED FILL — ROSUVASTATIN CALCIUM 20 MG: 20 | 30 days supply | Qty: 30 | Fill #0

## 2018-04-06 NOTE — Patient Instructions (Addendum)
We will start you on some mealtime insulin novolog 8 units 3 times daily with meals.  Please keep a log of your blood sugars as we discussed.  Take them once in the morning before breakfast.  Once 2 hours after lunch or just before lunch and again before bedtime.  We will switch your lantus to 28 units at bedtime daily.  I have increased your crestor to 20mg  daily.  Please come back in about 2 weeks.  We have stopped your invokana for the time being.  Please purchase some over the counter lamisil to help with the yeast infection.

## 2018-04-06 NOTE — Progress Notes (Signed)
CC: Candidal balanitis, hx of phimosis, HLD, T2DM,   HPI:  Thomas [facility] is a 62 y.o. male with PMH below.  Today we will address Candidal balanitis, hx of phimosis, HLD, T2DM,  Please see A&P for status of the patient's chronic medical conditions  Past Medical History:  Diagnosis Date  . Arthritis   . Asthma   . CHF (congestive heart failure) ([place])   . Diabetes mellitus 2003  . GERD (gastroesophageal reflux disease)   . Hyperlipidemia   . Hypertension   . Morbid obesity ([place])   . Sleep apnea    Review of Systems:  ROS: Pulmonary: pt denies increased work of breathing, shortness of breath,  Cardiac: pt denies palpitations, chest pain,  Abdominal: pt denies abdominal pain, nausea, vomiting, or diarrhea  Physical Exam:  Vitals:   04/06/18 1401  BP: 132/80  Pulse: 81  Temp: 98 F (36.7 C)  TempSrc: Oral  SpO2: 100%  Weight: 239 lb 4.8 oz (108.5 kg)   Cardiac: normal rate and rhythm, clear s1 and s2, no murmurs, rubs or gallops Pulmonary: CTAB, not in distress Abdominal: non distended abdomen, soft and nontender Extremities: no LE edema Psych: Alert, conversant, in good spirits Genitourinary: There is a thick white discharge underneath the foreskin at the glans with surrounding mild erythema.  The foreskin is fully retractable with no signs of obstruction.     Social History   Socioeconomic History  . Marital status: Single    Spouse name: Not on file  . Number of children: Not on file  . Years of education: Not on file  . Highest education level: Not on file  Occupational History  . Occupation: Insurance claims handler  Social Needs  . Financial resource strain: Not on file  . Food insecurity:    Worry: Not on file    Inability: Not on file  . Transportation needs:    Medical: Not on file    Non-medical: Not on file  Tobacco Use  . Smoking status: Never Smoker  . Smokeless tobacco: Never Used  Substance and Sexual Activity  . Alcohol use: No   Alcohol/week: 0.0 standard drinks  . Drug use: No  . Sexual activity: Not on file  Lifestyle  . Physical activity:    Days per week: Not on file    Minutes per session: Not on file  . Stress: Not on file  Relationships  . Social connections:    Talks on phone: Not on file    Gets together: Not on file    Attends religious service: Not on file    Active member of club or organization: Not on file    Attends meetings of clubs or organizations: Not on file    Relationship status: Not on file  . Intimate partner violence:    Fear of current or ex partner: Not on file    Emotionally abused: Not on file    Physically abused: Not on file    Forced sexual activity: Not on file  Other Topics Concern  . Not on file  Social History Narrative  . Not on file    Family History  Problem Relation Age of Onset  . Cancer Mother        ovarian  . Diabetes Mother   . Stroke Father   . Cancer Maternal Aunt        breast  . Diabetes Maternal Aunt   . Cancer Maternal Uncle        colon  Assessment & Plan:   See Encounters Tab for problem based charting.  Patient discussed with Dr. Angelia Mould

## 2018-04-07 DIAGNOSIS — B3742 Candidal balanitis: Secondary | ICD-10-CM | POA: Insufficient documentation

## 2018-04-07 DIAGNOSIS — Z87438 Personal history of other diseases of male genital organs: Secondary | ICD-10-CM | POA: Insufficient documentation

## 2018-04-07 NOTE — Assessment & Plan Note (Signed)
Patient reports about a week ago what sounded like phimosis he had some swelling and some interruption in urinary stream.  He was able to reduce it himself at home.  His exam is reassuring that he successfully reduced it himself.    -counseled pt on when to go to ED for this condition -once he gets his insurance and or orange card will refer him to urology for elective circumcision

## 2018-04-07 NOTE — Assessment & Plan Note (Addendum)
Lab Results  Component Value Date   HGBA1C 11.8 08/28/2017   Lab Results  Component Value Date   HGBA1C >14.0 (A) 04/06/2018   Last A1c significantly elevated.  He is on Lantus 14 units twice daily, Invokana 100 mg daily, Victoza 1.2 mg daily.  Apparently on chart review he does not tolerate metformin gets mood swings and confusion? Discussed this with him today and he confirmed this and is unwilling to try a small dose. A1C much worse today he checks it at home and knew it would be high.  Unfortunately says he left straight from work to get here no meter, no log.  -d/c invokana due to current candidal balanitis  -will start patient on mealtime insulin humalog 8 units TID -switch lantus to 28 units QHS instead of 14 BID for less injections -will also increase victoza to 1.8mg  daily. -2 week follow up instructed to bring in meter

## 2018-04-07 NOTE — Assessment & Plan Note (Signed)
Pt requires refills on medications with associated diagnosis above.  Reviewed disease process and find this medication to be necessary, will not change dose or alter current therapy. 

## 2018-04-07 NOTE — Assessment & Plan Note (Signed)
  Patient with history of severe hyperlipidemia currently on rosuvastatin 10mg  and zetia.   Lab Results  Component Value Date   CHOL 311 (H) 08/28/2017   HDL 32 (L) 08/28/2017   LDLCALC Comment 08/28/2017   TRIG 484 (H) 08/28/2017   CHOLHDL 9.7 (H) 08/28/2017   -increase crestor to 20mg  daily

## 2018-04-07 NOTE — Assessment & Plan Note (Signed)
Patient's physical exam consistent with candidal balanitis.  Pt reports the area is pruritic but not painful.  He has no systemic s/s of infection.    -will have patient use topical lamisil otc -d/c invokana for now

## 2018-04-08 NOTE — Progress Notes (Signed)
Internal Medicine Clinic Attending  Case discussed with Dr. Winfrey  at the time of the visit.  We reviewed the resident's history and exam and pertinent patient test results.  I agree with the assessment, diagnosis, and plan of care documented in the resident's note.  

## 2018-04-10 MED FILL — CARVEDILOL 25 MG TABLET: 25 | 30 days supply | Qty: 60 | Fill #2

## 2018-04-10 MED FILL — CHLORTHALIDONE 25 MG TABS: 25 | 30 days supply | Qty: 30 | Fill #7

## 2018-04-10 MED FILL — LOSARTAN POTASSIUM 50 MG TA: 50 | 30 days supply | Qty: 30 | Fill #2

## 2018-04-13 ENCOUNTER — Telehealth: Payer: Self-pay | Admitting: *Deleted

## 2018-04-13 NOTE — Telephone Encounter (Signed)
[facility] calls and states pt states he is on humalog now and they need a script, do not see in [place] but do see in note from 11/18 visit, please advise

## 2018-04-14 ENCOUNTER — Other Ambulatory Visit: Payer: Self-pay | Admitting: Internal Medicine

## 2018-04-14 MED ORDER — INSULIN LISPRO (1 UNIT DIAL) 100 UNIT/ML (KWIKPEN): 8.0000 [IU] | PEN_INJECTOR | Freq: Three times a day (TID) | SUBCUTANEOUS | 3 refills | Status: DC

## 2018-04-14 NOTE — Telephone Encounter (Signed)
Sorry about that there was some confusion whether he was getting his meds from [facility] [place] [facility].  I gave him some samples to get started. I sent prescription to the [facility]

## 2018-04-15 ENCOUNTER — Other Ambulatory Visit: Payer: Self-pay | Admitting: Internal Medicine

## 2018-04-15 DIAGNOSIS — J4520 Mild intermittent asthma, uncomplicated: Secondary | ICD-10-CM

## 2018-04-15 NOTE — Telephone Encounter (Signed)
sent 

## 2018-05-14 ENCOUNTER — Other Ambulatory Visit: Payer: Self-pay | Admitting: Internal Medicine

## 2018-05-14 DIAGNOSIS — IMO0001 Reserved for inherently not codable concepts without codable children: Secondary | ICD-10-CM

## 2018-05-14 DIAGNOSIS — E1165 Type 2 diabetes mellitus with hyperglycemia: Principal | ICD-10-CM

## 2018-05-14 DIAGNOSIS — I10 Essential (primary) hypertension: Secondary | ICD-10-CM

## 2018-05-14 MED FILL — CHLORTHALIDONE 25 MG TABS: 25 | 30 days supply | Qty: 30 | Fill #8

## 2018-05-14 MED FILL — CARVEDILOL 25 MG TABLET: 25 | 30 days supply | Qty: 60 | Fill #3

## 2018-05-15 NOTE — Telephone Encounter (Signed)
refilled 

## 2018-06-02 MED FILL — LOSARTAN POTASSIUM 50 MG TA: 50 | 30 days supply | Qty: 30 | Fill #0

## 2018-06-15 ENCOUNTER — Other Ambulatory Visit: Payer: Self-pay | Admitting: Internal Medicine

## 2018-06-15 DIAGNOSIS — I509 Heart failure, unspecified: Secondary | ICD-10-CM

## 2018-06-15 MED FILL — CHLORTHALIDONE 25 MG TABS: 25 | 30 days supply | Qty: 30 | Fill #9

## 2018-06-16 MED FILL — CARVEDILOL 25 MG TABLET: 25 | 30 days supply | Qty: 60 | Fill #0

## 2018-06-16 NOTE — Telephone Encounter (Signed)
Refilled

## 2018-06-20 NOTE — Progress Notes (Signed)
   CC: Follow up of HLD, HTN, T2DM  HPI:  [name] is a 63 y.o. male with PMH below.  Today we will address HLD, HTN, T2DM.    Please see A&P for status of the patient's chronic medical conditions  Past Medical History:  Diagnosis Date  . Arthritis   . Asthma   . CHF (congestive heart failure) ([place])   . Diabetes mellitus 2003  . GERD (gastroesophageal reflux disease)   . Hyperlipidemia   . Hypertension   . Morbid obesity ([place])   . Sleep apnea    Review of Systems:  ROS: Pulmonary: pt denies increased work of breathing, shortness of breath,  Cardiac: pt denies palpitations, chest pain,  Abdominal: pt denies abdominal pain, nausea, vomiting, or diarrhea  Physical Exam:  Vitals:   06/22/18 1359  BP: (!) 157/81  Pulse: 86  Temp: 98.3 F (36.8 C)  TempSrc: Oral  SpO2: 96%  Weight: 243 lb 12.8 oz (110.6 kg)  Height: 5\' 6"  (1.676 m)   Cardiac: normal rate and rhythm, clear s1 and s2, no murmurs, rubs or gallops Pulmonary: CTAB, not in distress Abdominal: non distended abdomen, soft and nontender Extremities: no LE edema Psych: Alert, conversant, in good spirits   Social History   Socioeconomic History  . Marital status: Single    Spouse name: Not on file  . Number of children: Not on file  . Years of education: Not on file  . Highest education level: Not on file  Occupational History  . Occupation: Insurance claims handler  Social Needs  . Financial resource strain: Not on file  . Food insecurity:    Worry: Not on file    Inability: Not on file  . Transportation needs:    Medical: Not on file    Non-medical: Not on file  Tobacco Use  . Smoking status: Never Smoker  . Smokeless tobacco: Never Used  Substance and Sexual Activity  . Alcohol use: No    Alcohol/week: 0.0 standard drinks  . Drug use: No  . Sexual activity: Not on file  Lifestyle  . Physical activity:    Days per week: Not on file    Minutes per session: Not on file  . Stress: Not on file    Relationships  . Social connections:    Talks on phone: Not on file    Gets together: Not on file    Attends religious service: Not on file    Active member of club or organization: Not on file    Attends meetings of clubs or organizations: Not on file    Relationship status: Not on file  . Intimate partner violence:    Fear of current or ex partner: Not on file    Emotionally abused: Not on file    Physically abused: Not on file    Forced sexual activity: Not on file  Other Topics Concern  . Not on file  Social History Narrative  . Not on file    Family History  Problem Relation Age of Onset  . Cancer Mother        ovarian  . Diabetes Mother   . Stroke Father   . Cancer Maternal Aunt        breast  . Diabetes Maternal Aunt   . Cancer Maternal Uncle        colon    Assessment & Plan:   See Encounters Tab for problem based charting.  Patient discussed with Dr. Angelia Mould

## 2018-06-22 ENCOUNTER — Ambulatory Visit (INDEPENDENT_AMBULATORY_CARE_PROVIDER_SITE_OTHER): Payer: Self-pay | Admitting: Internal Medicine

## 2018-06-22 ENCOUNTER — Other Ambulatory Visit: Payer: Self-pay

## 2018-06-22 ENCOUNTER — Encounter: Payer: Self-pay | Admitting: Internal Medicine

## 2018-06-22 ENCOUNTER — Encounter: Payer: Self-pay | Admitting: Dietician

## 2018-06-22 ENCOUNTER — Other Ambulatory Visit: Payer: Self-pay | Admitting: Dietician

## 2018-06-22 ENCOUNTER — Ambulatory Visit: Payer: Self-pay | Admitting: Dietician

## 2018-06-22 VITALS — BP 157/81 | HR 86 | Temp 98.3°F | Ht 66.0 in | Wt 243.8 lb

## 2018-06-22 DIAGNOSIS — E785 Hyperlipidemia, unspecified: Secondary | ICD-10-CM

## 2018-06-22 DIAGNOSIS — Z794 Long term (current) use of insulin: Secondary | ICD-10-CM

## 2018-06-22 DIAGNOSIS — IMO0001 Reserved for inherently not codable concepts without codable children: Secondary | ICD-10-CM

## 2018-06-22 DIAGNOSIS — E1165 Type 2 diabetes mellitus with hyperglycemia: Principal | ICD-10-CM

## 2018-06-22 DIAGNOSIS — Z79899 Other long term (current) drug therapy: Secondary | ICD-10-CM

## 2018-06-22 DIAGNOSIS — I10 Essential (primary) hypertension: Secondary | ICD-10-CM

## 2018-06-22 DIAGNOSIS — E118 Type 2 diabetes mellitus with unspecified complications: Secondary | ICD-10-CM

## 2018-06-22 LAB — GLUCOSE, CAPILLARY: Glucose-Capillary: 303 mg/dL — ABNORMAL HIGH (ref 70–99)

## 2018-06-22 LAB — POCT GLYCOSYLATED HEMOGLOBIN (HGB A1C): HEMOGLOBIN A1C: 12.3 % — AB (ref 4.0–5.6)

## 2018-06-22 LAB — HM DIABETES EYE EXAM

## 2018-06-22 MED FILL — LOSARTAN POTASSIUM 50 MG TA: 50 | 30 days supply | Qty: 30 | Fill #1

## 2018-06-22 NOTE — Assessment & Plan Note (Signed)
last visit we increased crestor to 20mg  daily as pt needs to be on high intensity statin.  He also takes Zetia.  He reports he has been non compliant with crestor, not taking zetia.  -asked pt to restart these meds will not check lipid panel due to inconsistency in taking them.

## 2018-06-22 NOTE — Patient Instructions (Signed)
Thomas [facility], it's good to see you again.  We will increase your humalog to 10 units three times daily with meals and increase your lantus to 30 units every evening.  Please keep a log of your blood sugars taken before every meal three times a day.  We will have you come back and adjust your insulin with our clinical pharmacist.  Please start back on the crestor and we will refill your losartan as well for your blood pressure.

## 2018-06-22 NOTE — Progress Notes (Signed)
Retinal images were done and transmitted today. Debera Lat, RD 06/22/2018 4:21 PM.

## 2018-06-22 NOTE — Assessment & Plan Note (Addendum)
BP Readings from Last 3 Encounters:  06/22/18 (!) 157/81  04/06/18 132/80  12/22/17 122/69   Patient reports being out of losartan normally bp is well controlled.  He has multiple refills available.  -Encourage patient to refill losartan 50mg , continue carvedilol 25mg  BID.

## 2018-06-22 NOTE — Assessment & Plan Note (Addendum)
Lab Results  Component Value Date   HGBA1C 12.3 (A) 06/22/2018   At our last visit we increased victoza to 1.8mg  daily, stopped invokana due to candidal infection and added mealtime insulin 8 units TID.  He was supposed to follow up in Early December.  He can never tolerate metformin and will not try again.  He agreed to follow-up with our clinical pharmacist  in about 1 to 2 weeks. His A1c has improved to 12.3 from greater than 14 but we still have a long wait ago he understands this and we focused on the positives that it is coming down.  -I will empirically increase his mealtime insulin to 10 units 3 times daily and we will also increase his Lantus to 30 units daily.   -Furl to clinical pharmacist to help titrate insulin

## 2018-06-23 NOTE — Patient Instructions (Addendum)
  You had photos taken of your retinas (part of your eyes).    These photos were sent to eye doctors in [place] to see if diabetes is affecting the back of your eye.    If the doctor in [place] tells us that you should see an eye doctor, we will call you.   If the doctor in [place] says to return in 1 year. You will get a copy of the report in the mail.    Thank you for allowing us to help with your diabetes and vision care today!   Sincerely,  Donna Plyler [phone]  

## 2018-06-24 NOTE — Progress Notes (Signed)
Internal Medicine Clinic Attending  Case discussed with Dr. Winfrey  at the time of the visit.  We reviewed the resident's history and exam and pertinent patient test results.  I agree with the assessment, diagnosis, and plan of care documented in the resident's note.  

## 2018-07-06 ENCOUNTER — Ambulatory Visit: Payer: Self-pay | Admitting: Pharmacist

## 2018-07-10 ENCOUNTER — Encounter: Payer: Self-pay | Admitting: Dietician

## 2018-07-13 NOTE — Addendum Note (Signed)
Addended by: Hulan Fray on: 07/13/2018 08:21 PM   Modules accepted: Orders

## 2018-07-16 MED FILL — CHLORTHALIDONE 25 MG TABS: 25 | 30 days supply | Qty: 30 | Fill #10 | Status: TO

## 2018-07-16 MED FILL — CARVEDILOL 25 MG TABLET: 25 | 30 days supply | Qty: 60 | Fill #1 | Status: TO

## 2018-07-31 MED FILL — LOSARTAN POTASSIUM 50 MG TA: 50 | 30 days supply | Qty: 30 | Fill #2 | Status: TO

## 2018-08-14 MED FILL — CARVEDILOL 25 MG TABLET: 25 | 30 days supply | Qty: 60 | Fill #0

## 2018-08-14 MED FILL — CHLORTHALIDONE 25 MG TABS: 25 | 30 days supply | Qty: 30 | Fill #0

## 2018-08-14 MED FILL — LOSARTAN POTASSIUM 50 MG TA: 50 | 30 days supply | Qty: 30 | Fill #0

## 2018-09-14 ENCOUNTER — Other Ambulatory Visit: Payer: Self-pay | Admitting: Internal Medicine

## 2018-09-14 DIAGNOSIS — I509 Heart failure, unspecified: Secondary | ICD-10-CM

## 2018-09-14 MED FILL — LOSARTAN POTASSIUM 50 MG TA: 50 | 30 days supply | Qty: 30 | Fill #1

## 2018-09-14 MED FILL — CARVEDILOL 25 MG TABLET: 25 | 30 days supply | Qty: 60 | Fill #1

## 2018-09-15 ENCOUNTER — Other Ambulatory Visit: Payer: Self-pay | Admitting: Internal Medicine

## 2018-09-15 DIAGNOSIS — Z794 Long term (current) use of insulin: Principal | ICD-10-CM

## 2018-09-15 DIAGNOSIS — IMO0001 Reserved for inherently not codable concepts without codable children: Secondary | ICD-10-CM

## 2018-09-15 DIAGNOSIS — E1165 Type 2 diabetes mellitus with hyperglycemia: Principal | ICD-10-CM

## 2018-09-15 MED FILL — CHLORTHALIDONE 25 MG TABS: 25 | 30 days supply | Qty: 30 | Fill #0 | Status: TO

## 2018-09-15 NOTE — Telephone Encounter (Signed)
refilled 

## 2018-09-15 NOTE — Telephone Encounter (Signed)
Can you clarify the dose. Per PCP last note he should be on 30 units of Lantus

## 2018-09-16 ENCOUNTER — Other Ambulatory Visit: Payer: Self-pay | Admitting: Internal Medicine

## 2018-09-16 DIAGNOSIS — Z794 Long term (current) use of insulin: Principal | ICD-10-CM

## 2018-09-16 DIAGNOSIS — E1165 Type 2 diabetes mellitus with hyperglycemia: Principal | ICD-10-CM

## 2018-09-16 DIAGNOSIS — IMO0001 Reserved for inherently not codable concepts without codable children: Secondary | ICD-10-CM

## 2018-09-16 MED ORDER — INSULIN GLARGINE 100 UNIT/ML SOLOSTAR PEN: 30.0000 [IU] | PEN_INJECTOR | Freq: Every day | SUBCUTANEOUS | 2 refills | Status: DC

## 2018-09-23 ENCOUNTER — Other Ambulatory Visit: Payer: Self-pay | Admitting: *Deleted

## 2018-09-23 ENCOUNTER — Other Ambulatory Visit: Payer: Self-pay | Admitting: Internal Medicine

## 2018-09-23 DIAGNOSIS — IMO0001 Reserved for inherently not codable concepts without codable children: Secondary | ICD-10-CM

## 2018-09-23 DIAGNOSIS — E1165 Type 2 diabetes mellitus with hyperglycemia: Principal | ICD-10-CM

## 2018-09-23 NOTE — Telephone Encounter (Signed)
Fax from [facility] - pt stated dose has increased?  Send new rx if this correct   Thanks

## 2018-09-24 NOTE — Telephone Encounter (Signed)
I noticed that the dose was wrong 4/28 but had already signed and put in correct orders only for correct dosage on 4/29

## 2018-09-25 MED ORDER — INSULIN LISPRO (1 UNIT DIAL) 100 UNIT/ML (KWIKPEN): 10.0000 [IU] | PEN_INJECTOR | Freq: Three times a day (TID) | SUBCUTANEOUS | 3 refills | Status: DC

## 2018-09-25 NOTE — Telephone Encounter (Signed)
refilled 

## 2018-09-25 NOTE — Telephone Encounter (Signed)
I see Lantus rx on 4/29. But [facility] is asking about Humalog rx. Thanks

## 2018-09-25 NOTE — Telephone Encounter (Signed)
Pt not taking zetia will repeat lipid panel at next visit

## 2018-10-09 ENCOUNTER — Other Ambulatory Visit: Payer: Self-pay | Admitting: Internal Medicine

## 2018-10-09 DIAGNOSIS — I509 Heart failure, unspecified: Secondary | ICD-10-CM

## 2018-10-09 MED FILL — LOSARTAN POTASSIUM 50 MG TA: 50 | 30 days supply | Qty: 30 | Fill #2

## 2018-10-13 NOTE — Telephone Encounter (Signed)
refilled 

## 2018-10-16 MED FILL — CARVEDILOL 25 MG TABLET: 25 | 30 days supply | Qty: 60 | Fill #0

## 2018-10-16 MED FILL — CHLORTHALIDONE 25 MG TABS: 25 | 30 days supply | Qty: 30 | Fill #0

## 2018-10-20 ENCOUNTER — Other Ambulatory Visit: Payer: Self-pay | Admitting: *Deleted

## 2018-10-20 DIAGNOSIS — I509 Heart failure, unspecified: Secondary | ICD-10-CM

## 2018-10-20 MED ORDER — CHLORTHALIDONE 25 MG PO TABS: 25.0000 mg | ORAL_TABLET | Freq: Every day | ORAL | 0 refills | Status: DC

## 2018-10-20 MED ORDER — CARVEDILOL 25 MG PO TABS: 25.0000 mg | ORAL_TABLET | Freq: Two times a day (BID) | ORAL | 2 refills | Status: DC

## 2018-10-20 NOTE — Telephone Encounter (Signed)
refilled 

## 2018-10-20 NOTE — Telephone Encounter (Signed)
Fax from [facility] If pt is on "IM Program" - re-send rxs with this on them Thanks

## 2018-11-16 MED FILL — CARVEDILOL 25 MG TABLET: 25 | 30 days supply | Qty: 60 | Fill #1

## 2018-11-16 MED FILL — CHLORTHALIDONE 25 MG TABS: 25 | 30 days supply | Qty: 30 | Fill #1

## 2018-11-16 MED FILL — LOSARTAN POTASSIUM 50 MG TA: 50 | 30 days supply | Qty: 30 | Fill #0

## 2018-12-14 ENCOUNTER — Other Ambulatory Visit: Payer: Self-pay | Admitting: Internal Medicine

## 2018-12-14 DIAGNOSIS — IMO0001 Reserved for inherently not codable concepts without codable children: Secondary | ICD-10-CM

## 2018-12-14 NOTE — Telephone Encounter (Signed)
refilled 

## 2018-12-16 ENCOUNTER — Other Ambulatory Visit: Payer: Self-pay

## 2018-12-16 DIAGNOSIS — Z20822 Contact with and (suspected) exposure to covid-19: Secondary | ICD-10-CM

## 2018-12-16 MED FILL — LOSARTAN POTASSIUM 50 MG TA: 50 | 30 days supply | Qty: 30 | Fill #1

## 2018-12-16 MED FILL — CHLORTHALIDONE 25 MG TABS: 25 | 30 days supply | Qty: 30 | Fill #0

## 2018-12-16 MED FILL — CARVEDILOL 25 MG TABLET: 25 | 30 days supply | Qty: 60 | Fill #2

## 2018-12-18 ENCOUNTER — Other Ambulatory Visit: Payer: Self-pay | Admitting: Internal Medicine

## 2018-12-18 DIAGNOSIS — IMO0001 Reserved for inherently not codable concepts without codable children: Secondary | ICD-10-CM

## 2018-12-18 LAB — NOVEL CORONAVIRUS, NAA: SARS-CoV-2, NAA: NOT DETECTED

## 2018-12-18 NOTE — Telephone Encounter (Signed)
refilled 

## 2018-12-21 ENCOUNTER — Other Ambulatory Visit: Payer: Self-pay | Admitting: Internal Medicine

## 2018-12-21 DIAGNOSIS — IMO0001 Reserved for inherently not codable concepts without codable children: Secondary | ICD-10-CM

## 2019-01-03 NOTE — Progress Notes (Signed)
CC: T2DM, MDD  HPI:  [name] is a 63 y.o. male with PMH below.  Today we will address T2DM, MDD   Please see A&P for status of the patient's chronic medical conditions  Past Medical History:  Diagnosis Date  . Arthritis   . Asthma   . CHF (congestive heart failure) ([place])   . Diabetes mellitus 2003  . GERD (gastroesophageal reflux disease)   . Hyperlipidemia   . Hypertension   . Morbid obesity ([place])   . Sleep apnea    Review of Systems:  ROS: Pulmonary: pt denies increased work of breathing, shortness of breath,  Cardiac: pt denies palpitations, chest pain,  Abdominal: pt denies abdominal pain, nausea, vomiting, or diarrhea   Physical Exam:  Vitals:   01/04/19 1329 01/04/19 1338  BP: (!) 160/84 140/79  Pulse: 81 78  Temp: 98.3 F (36.8 C)   TempSrc: Oral   SpO2: 98%   Weight: 244 lb 8 oz (110.9 kg)   Height: 5\' 6"  (1.676 m)    Cardiac: JVD flat, normal rate and rhythm, clear s1 and s2, no murmurs, rubs or gallops, no LE edema Pulmonary: CTAB, not in distress Abdominal: non distended abdomen, soft and nontender Psych: Alert, conversant, in good spirits   Social History   Socioeconomic History  . Marital status: Single    Spouse name: Not on file  . Number of children: Not on file  . Years of education: Not on file  . Highest education level: Not on file  Occupational History  . Occupation: Insurance claims handler  Social Needs  . Financial resource strain: Not on file  . Food insecurity    Worry: Not on file    Inability: Not on file  . Transportation needs    Medical: Not on file    Non-medical: Not on file  Tobacco Use  . Smoking status: Never Smoker  . Smokeless tobacco: Never Used  Substance and Sexual Activity  . Alcohol use: No    Alcohol/week: 0.0 standard drinks  . Drug use: No  . Sexual activity: Not on file  Lifestyle  . Physical activity    Days per week: Not on file    Minutes per session: Not on file  . Stress: Not on file   Relationships  . Social Herbalist on phone: Not on file    Gets together: Not on file    Attends religious service: Not on file    Active member of club or organization: Not on file    Attends meetings of clubs or organizations: Not on file    Relationship status: Not on file  . Intimate partner violence    Fear of current or ex partner: Not on file    Emotionally abused: Not on file    Physically abused: Not on file    Forced sexual activity: Not on file  Other Topics Concern  . Not on file  Social History Narrative  . Not on file    Family History  Problem Relation Age of Onset  . Cancer Mother        ovarian  . Diabetes Mother   . Stroke Father   . Cancer Maternal Aunt        breast  . Diabetes Maternal Aunt   . Cancer Maternal Uncle        colon    Assessment & Plan:   See Encounters Tab for problem based charting.  Patient seen with Dr.  Dareen Piano

## 2019-01-04 ENCOUNTER — Other Ambulatory Visit: Payer: Self-pay

## 2019-01-04 ENCOUNTER — Ambulatory Visit: Payer: Self-pay | Admitting: Internal Medicine

## 2019-01-04 ENCOUNTER — Encounter: Payer: Self-pay | Admitting: Internal Medicine

## 2019-01-04 VITALS — BP 140/79 | HR 78 | Temp 98.3°F | Ht 66.0 in | Wt 244.5 lb

## 2019-01-04 DIAGNOSIS — E118 Type 2 diabetes mellitus with unspecified complications: Secondary | ICD-10-CM

## 2019-01-04 DIAGNOSIS — E1165 Type 2 diabetes mellitus with hyperglycemia: Secondary | ICD-10-CM

## 2019-01-04 DIAGNOSIS — IMO0001 Reserved for inherently not codable concepts without codable children: Secondary | ICD-10-CM

## 2019-01-04 DIAGNOSIS — Z794 Long term (current) use of insulin: Secondary | ICD-10-CM

## 2019-01-04 DIAGNOSIS — F329 Major depressive disorder, single episode, unspecified: Secondary | ICD-10-CM

## 2019-01-04 DIAGNOSIS — R4589 Other symptoms and signs involving emotional state: Secondary | ICD-10-CM

## 2019-01-04 LAB — POCT GLYCOSYLATED HEMOGLOBIN (HGB A1C): Hemoglobin A1C: 13.3 % — AB (ref 4.0–5.6)

## 2019-01-04 LAB — GLUCOSE, CAPILLARY: Glucose-Capillary: 366 mg/dL — ABNORMAL HIGH (ref 70–99)

## 2019-01-04 MED ORDER — LANTUS SOLOSTAR 100 UNIT/ML ~~LOC~~ SOPN: 20.0000 [IU] | PEN_INJECTOR | Freq: Two times a day (BID) | SUBCUTANEOUS | 2 refills | Status: DC

## 2019-01-04 NOTE — Patient Instructions (Addendum)
Mr. Nydam, I feel the primary way to get your diabetes under better control will be through lifestyle and behavioral changes.  We will have you see our behavioral expert here in the clinic.  In addition since you're having trouble taking insulin with meals we will increase your lantus to 20 units twice daily.  We will decrease your mealtime novolog to 8 units with meals.  Please let us know if you have any blood sugars that are <100.  If you take your blood sugar before a meal and it is less than 100 please do not take any mealtime insulin (novolog).

## 2019-01-05 LAB — BMP8+ANION GAP
Anion Gap: 17 mmol/L (ref 10.0–18.0)
BUN/Creatinine Ratio: 17 (ref 10–24)
BUN: 18 mg/dL (ref 8–27)
CO2: 26 mmol/L (ref 20–29)
Calcium: 9.3 mg/dL (ref 8.6–10.2)
Chloride: 94 mmol/L — ABNORMAL LOW (ref 96–106)
Creatinine, Ser: 1.07 mg/dL (ref 0.76–1.27)
GFR calc Af Amer: 85 mL/min/{1.73_m2} (ref >59)
GFR calc non Af Amer: 73 mL/min/{1.73_m2} (ref >59)
Glucose: 317 mg/dL — ABNORMAL HIGH (ref 65–99)
Potassium: 3.8 mmol/L (ref 3.5–5.2)
Sodium: 137 mmol/L (ref 134–144)

## 2019-01-05 MED ORDER — INSULIN LISPRO (1 UNIT DIAL) 100 UNIT/ML (KWIKPEN): PEN_INJECTOR | SUBCUTANEOUS | 3 refills | Status: DC

## 2019-01-05 NOTE — Assessment & Plan Note (Signed)
Continues on increased dose of victoza. Stopped invokana due to candidal infection and adjusting mealtime insulin last increased to 10 TID AC.   He can never tolerate metformin and will not try again. Eating more during the quarantine, eating the wrong things cereals, eating candy gained about 15lbs.  Not getting out much, no exercise staying in bed and is depressed.  Taking lantus twice daily because he prefers it at 14 units.  Taking humalog 12 u with meals.  Has not been taking it with meals misses about 6 doses per week on average usually he's out and busy or doesn't think about it when he eats.  He did not bring his meter.  He is checking but not as frequently as he should.  He reports never missing the lantus he has been on twice a day dosing for a long period and never switched to once daily as we discussed last visit.    -treat depression and maladaptive behavioral changes with CBT with our in house behavioral health program via [place] -since he is regular with lantus we will shift the ratio of insulin in lantus' favor increasing lantus to 20 u BID and dropping mealtime down to 8 u.  He will monitor cbg closely during this change and let us know how it's going in the next week

## 2019-01-05 NOTE — Assessment & Plan Note (Signed)
PHQ 9 is 13 today.  He has been more depressed after losing his job, bills on his mind.  Symptoms of anhedonia.  Yesterday went to get up and do things and then just stayed in bed all day.  He thinks he had more success on cymbalta and took it due to the coexisting indication for neuropathic pain but does not like the idea of being on an antidepressant.  He d/ced it and felt like he "wasn't himself".  We discussed that he may find a lot of benefit from talking through his problems and going over some behavioral changes to improve his mood and outlook.  -[facility] referral in house

## 2019-01-07 ENCOUNTER — Telehealth: Payer: Self-pay | Admitting: Internal Medicine

## 2019-01-07 NOTE — Telephone Encounter (Signed)
Went over Mr. Buzan's BMP with him.  We also discussed his insulin changes we made.  He reports better control with the increased dosage of lantus.  He will follow up on Tuesday and call to schedule an appointment if he has not heard about a behavioral health appointment with [place].

## 2019-01-12 ENCOUNTER — Telehealth: Payer: Self-pay | Admitting: Licensed Clinical Social Worker

## 2019-01-12 ENCOUNTER — Encounter: Payer: Self-pay | Admitting: Licensed Clinical Social Worker

## 2019-01-12 NOTE — Progress Notes (Signed)
Internal Medicine Clinic Attending  Case discussed with Dr. Winfrey  at the time of the visit.  We reviewed the resident's history and exam and pertinent patient test results.  I agree with the assessment, diagnosis, and plan of care documented in the resident's note.  

## 2019-01-12 NOTE — Telephone Encounter (Signed)
Patient was contacted (1st attempt) due to a referral from his doctor. Pt did not answer, and a vm was left for the pt to contact our office to schedule his initial appointment.

## 2019-01-15 MED FILL — CHLORTHALIDONE 25 MG TABS: 25 | 30 days supply | Qty: 30 | Fill #1

## 2019-01-15 MED FILL — LOSARTAN POTASSIUM 50 MG TA: 50 | 30 days supply | Qty: 30 | Fill #2

## 2019-01-15 MED FILL — CARVEDILOL 25 MG TABLET: 25 | 30 days supply | Qty: 60 | Fill #3

## 2019-01-21 ENCOUNTER — Telehealth: Payer: Self-pay | Admitting: Licensed Clinical Social Worker

## 2019-01-21 NOTE — Telephone Encounter (Signed)
Patient was called to set up an appointment. Patient was in a store, and reported he would call our office back to be placed on my schedule.

## 2019-01-28 ENCOUNTER — Other Ambulatory Visit: Payer: Self-pay | Admitting: Internal Medicine

## 2019-01-28 NOTE — Telephone Encounter (Signed)
Pt was concerned if [facility] was aware of increase in LANTUS dose, assured him that new script for the 20u of LANTUS went to [facility], he will call 9/11

## 2019-01-28 NOTE — Telephone Encounter (Signed)
Refill Request-Pt normally gets it filled with [facility].  Pt wants to know how to get an additional refill and has questions.  Please call patient back.   insulin lispro (HUMALOG) 100 UNIT/ML KwikPen

## 2019-01-29 ENCOUNTER — Ambulatory Visit: Payer: Self-pay | Admitting: Pharmacist

## 2019-02-11 ENCOUNTER — Other Ambulatory Visit: Payer: Self-pay

## 2019-02-11 ENCOUNTER — Ambulatory Visit: Payer: Self-pay | Admitting: Licensed Clinical Social Worker

## 2019-02-11 ENCOUNTER — Ambulatory Visit (INDEPENDENT_AMBULATORY_CARE_PROVIDER_SITE_OTHER): Payer: Self-pay | Admitting: Licensed Clinical Social Worker

## 2019-02-11 ENCOUNTER — Encounter: Payer: Self-pay | Admitting: Licensed Clinical Social Worker

## 2019-02-11 DIAGNOSIS — R4589 Other symptoms and signs involving emotional state: Secondary | ICD-10-CM

## 2019-02-11 DIAGNOSIS — F329 Major depressive disorder, single episode, unspecified: Secondary | ICD-10-CM

## 2019-02-11 MED FILL — CARVEDILOL 25 MG TABLET: 25 | 30 days supply | Qty: 60 | Fill #4

## 2019-02-11 MED FILL — LOSARTAN POTASSIUM 50 MG TA: 50 | 30 days supply | Qty: 30 | Fill #3

## 2019-02-11 MED FILL — CHLORTHALIDONE 25 MG TABS: 25 | 30 days supply | Qty: 30 | Fill #2

## 2019-02-11 NOTE — BH Specialist Note (Signed)
Integrated Behavioral Health Initial Visit  MRN: [id number] Name: Thomas Gardner  Number of [place] Clinician visits:: 1/6 Session Start time: 11:00  Session End time: 11:40 Total time: 40 minutes  Type of Service: [place]: Thomas Gardner is a 63 y.o. male  whom attended the session individually.  Patient was referred by Dr. Shan Levans for . Patient reports the following symptoms/concerns: depression, co-dependent tendencies.  Duration of problem: recurrent for many years, currently active; Severity of problem: moderate  OBJECTIVE: Mood: Netural and Affect: Appropriate Risk of harm to self or others: No plan to harm self or others. Patient has a history of SI, but none currently.   LIFE CONTEXT: Family and Social: Patient lives alone in his parent's home. Patient's parents are deceased. Patient currently does not have a partner, and reported that he has a history of engaging in co-dependent relationships.  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  Self-Care: Patient is working to developing a healthy routine and sleep pattern.  Life Changes: Covid has presented challenges for the patient and increased his isolation.   GOALS ADDRESSED: Patient will: 1. Reduce symptoms of: anxiety, depression and stress 2. Increase knowledge and/or ability of: coping skills, healthy habits, self-management skills and stress reduction  3. Demonstrate ability to: Increase healthy adjustment to current life circumstances and Increase adequate support systems for patient/family  INTERVENTIONS: Interventions utilized: Motivational Interviewing, Behavioral Activation, Brief CBT and Supportive Counseling  Standardized Assessments completed: assessed for SI, HI, and self-harm.  ASSESSMENT: Patient currently experiencing mild to moderate levels of depression. Patient has a  significant trauma history from childhood. Patient has been in therapy in the past to address some of the trauma he experienced in childhood. Patient has a history of recurrent depression all the way from childhood. Patient has a history of self-harm, but has not engaged in this act since he was a teenager. Patient wants to address co-dependency, and is interested in therapy to prevent from developing a more serious level of depression. Patient denies any thoughts of hurting himself or others.   Patient identified Covid and quarantine as a trigger for his recent onset of depression.    Patient may benefit from counseling.  PLAN: 1. Follow up with behavioral health clinician on : one to two weeks.   Dessie Coma, [facility], [place]

## 2019-02-12 ENCOUNTER — Telehealth: Payer: Self-pay | Admitting: Pharmacist

## 2019-02-12 DIAGNOSIS — IMO0001 Reserved for inherently not codable concepts without codable children: Secondary | ICD-10-CM

## 2019-02-12 MED ORDER — XULTOPHY 100-3.6 UNIT-MG/ML ~~LOC~~ SOPN: 40.0000 [IU] | PEN_INJECTOR | Freq: Every day | SUBCUTANEOUS | 2 refills | Status: DC

## 2019-02-12 NOTE — Telephone Encounter (Signed)
Prescription sent for Xultophy for patient assistance program.

## 2019-02-25 ENCOUNTER — Telehealth: Payer: Self-pay | Admitting: Dietician

## 2019-02-25 ENCOUNTER — Other Ambulatory Visit: Payer: Self-pay

## 2019-02-25 ENCOUNTER — Encounter: Payer: Self-pay | Admitting: Licensed Clinical Social Worker

## 2019-02-25 ENCOUNTER — Ambulatory Visit (INDEPENDENT_AMBULATORY_CARE_PROVIDER_SITE_OTHER): Payer: Self-pay | Admitting: Licensed Clinical Social Worker

## 2019-02-25 DIAGNOSIS — R4589 Other symptoms and signs involving emotional state: Secondary | ICD-10-CM

## 2019-02-25 NOTE — Telephone Encounter (Signed)
[name] was seeing Ms. Thomas Gardner today who asked me to assist him with obtaining diabtes medicine. He is asking for both Xultophy and Humalog. He says he only has two days of medicine left.   I called the [facility]. He has not re certified so they cannot get his medicine for him. He verbalized understanding that he needs to do this to get his medicine. Discussed with Dr. Daryll Drown who approved samples of Novolog to use instead of Humalog and liraglutide instead of the Xultophy. I think he [facility] if desired.

## 2019-02-25 NOTE — BH Specialist Note (Signed)
Integrated Behavioral Health Follow Up Visit  MRN: [id number] Name: Thomas Gardner  Number of [place] Clinician visits: 2/6 Session Start time: 11:00  Session End time: 11:55 Total time: 55 minutes  Type of Service: [place] Interpretor:No.   SUBJECTIVE: LEONHARD DOWIE is a 63 y.o. male accompanied by whom attended the session individually.  Patient was referred by Dr. Shan Levans for anxiety and depression. Patient reports the following symptoms/concerns: depression, co-dependency, anxiety, and work stressors. Duration of problem: recurrent for many years; Severity of problem: moderate  OBJECTIVE: Mood: Netural and Affect: Appropriate Risk of harm to self or others: No plan to harm self or others  GOALS ADDRESSED: Patient will: 1.  Reduce symptoms of: anxiety, depression, insomnia and stress  2.  Increase knowledge and/or ability of: coping skills, healthy habits, self-management skills and stress reduction  3.  Demonstrate ability to: Increase healthy adjustment to current life circumstances, Increase adequate support systems for patient/family and Increase motivation to adhere to plan of care  INTERVENTIONS: Interventions utilized:  Motivational Interviewing, Brief CBT, Supportive Counseling and Medication Monitoring Standardized Assessments completed: assessed for SI, HI, and self-harm.  ASSESSMENT: Patient currently experiencing increased stress due to his work environment. Patient reported his work was asking him to switch to a different department that is a trigger for additional stress, and the patient is refusing to make that transition. Patient reported since making that decision, he has slept the past two nights.  Patient is working to having healthy boundaries and relationships with others, by not allowing others to use him emotionally. Patient continues to have stress and anxiety. Patient worries about how his stress  impacts his blood pressure. Patient needs to continue to practice being in the here-and-now.   Patient may benefit from counseling.  PLAN: 1. Follow up with behavioral health clinician on : two weeks.   Dessie Coma, [facility], [place]

## 2019-03-08 ENCOUNTER — Other Ambulatory Visit: Payer: Self-pay | Admitting: Internal Medicine

## 2019-03-08 ENCOUNTER — Encounter: Payer: Self-pay | Admitting: Internal Medicine

## 2019-03-08 MED ORDER — INSULIN ASPART 100 UNIT/ML FLEXPEN: 8.0000 [IU] | PEN_INJECTOR | Freq: Three times a day (TID) | SUBCUTANEOUS | 1 refills | Status: DC

## 2019-03-08 MED ORDER — LANTUS SOLOSTAR 100 UNIT/ML ~~LOC~~ SOPN: 20.0000 [IU] | PEN_INJECTOR | Freq: Two times a day (BID) | SUBCUTANEOUS | 1 refills | Status: DC

## 2019-03-08 MED FILL — NOVOLOG FLEXPEN SYRINGE: 100 | 25 days supply | Qty: 6 | Fill #0

## 2019-03-08 MED FILL — LANTUS SOLOSTAR 100 UNITS/M: 100 | 30 days supply | Qty: 12 | Fill #0

## 2019-03-11 ENCOUNTER — Other Ambulatory Visit: Payer: Self-pay

## 2019-03-11 ENCOUNTER — Ambulatory Visit (INDEPENDENT_AMBULATORY_CARE_PROVIDER_SITE_OTHER): Payer: Self-pay | Admitting: Licensed Clinical Social Worker

## 2019-03-11 DIAGNOSIS — F419 Anxiety disorder, unspecified: Secondary | ICD-10-CM

## 2019-03-11 NOTE — BH Specialist Note (Signed)
Integrated Behavioral Health Follow Up Visit  MRN: [id number] Name: Thomas Gardner  Number of [place] Clinician visits: 3/6 Session Start time: 10:05  Session End time: 11:00 Total time: 55 minutes  Type of Service: Integrated Behavioral Health- Individual Interpretor:No.   SUBJECTIVE: Thomas Gardner is a 63 y.o. male  whom attended the session.  Patient was referred by Dr. Shan Levans for anxiety and depression. Patient reports the following symptoms/concerns: depression, co-dependency, anxiety, and work stressors. Duration of problem: recurrent for many years; Severity of problem: moderate  OBJECTIVE: Mood: Netural and Affect: Appropriate Risk of harm to self or others: No plan to harm self or others  LIFE CONTEXT: Family and Social: Patient processed his feelings and experiences with his family. Patient will have to have a conversation with his step-mother in order to fully purchase his childhood home. Patient processed his anxiety towards this upcoming conversation. School/Work: Patient is continuing to have high stress interactions at work, and has changed his work schedule to try to reduce his anxiety.  Self-Care: Improvement in this area. Patient is changing his food intake to try to live a healthier lifestyle.   GOALS ADDRESSED: Patient will: 1.  Reduce symptoms of: agitation, anxiety, depression and stress  2.  Increase knowledge and/or ability of: coping skills, healthy habits, self-management skills and stress reduction  3.  Demonstrate ability to: Increase healthy adjustment to current life circumstances and Increase adequate support systems for patient/family  INTERVENTIONS: Interventions utilized:  Motivational Interviewing, Behavioral Activation, Brief CBT and Supportive Counseling Standardized Assessments completed: Not Needed  ASSESSMENT: Patient currently experiencing moderate levels of anxiety, and mild levels of depression. Patient processed  panic and somatic symptoms that occur when he is in a stressful situation at work. 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 Patient reported having dizziness, increased heart rate and BP, and requires multiple breaks throughout the day to reduce his anxiety. Patient requested a letter for his work to explain the health and mental health effects that occur when he is in stressful environments.   The patient reviewed grounding techniques and thoughts/areas of control that can reduce his panic when occurring. Patient is implementing other healthy strategies into his life to improve his overall health.   Patient may benefit from counseling.  PLAN: 1. Follow up with behavioral health clinician on : one to two weeks.   Dessie Coma, [facility], [place]

## 2019-03-15 ENCOUNTER — Other Ambulatory Visit: Payer: Self-pay | Admitting: Internal Medicine

## 2019-03-15 DIAGNOSIS — I509 Heart failure, unspecified: Secondary | ICD-10-CM

## 2019-03-15 MED FILL — CARVEDILOL 25 MG TABLET: 25 | 30 days supply | Qty: 60 | Fill #5

## 2019-03-15 MED FILL — LOSARTAN POTASSIUM 50 MG TA: 50 | 30 days supply | Qty: 30 | Fill #4

## 2019-03-16 ENCOUNTER — Telehealth: Payer: Self-pay | Admitting: *Deleted

## 2019-03-16 MED FILL — CHLORTHALIDONE 25 MG TABS: 25 | 30 days supply | Qty: 30 | Fill #0

## 2019-03-16 NOTE — Telephone Encounter (Signed)
Pt needs to see financial counselor

## 2019-03-16 NOTE — Telephone Encounter (Signed)
Appt has been sch for the patient for 04/19/2019 with his PCP @ 3:45pm.

## 2019-03-16 NOTE — Telephone Encounter (Signed)
Overdue for a PCP appointment.  Please schedule with PCP first available.

## 2019-03-18 ENCOUNTER — Encounter: Payer: Self-pay | Admitting: Licensed Clinical Social Worker

## 2019-03-18 NOTE — Telephone Encounter (Signed)
Called pt; will make financial appt when he come on next visit and pick up the list of requirements.

## 2019-03-25 ENCOUNTER — Ambulatory Visit (INDEPENDENT_AMBULATORY_CARE_PROVIDER_SITE_OTHER): Payer: Self-pay | Admitting: Licensed Clinical Social Worker

## 2019-03-25 ENCOUNTER — Other Ambulatory Visit: Payer: Self-pay

## 2019-03-25 DIAGNOSIS — R4589 Other symptoms and signs involving emotional state: Secondary | ICD-10-CM

## 2019-03-25 DIAGNOSIS — F419 Anxiety disorder, unspecified: Secondary | ICD-10-CM

## 2019-03-25 NOTE — BH Specialist Note (Signed)
Integrated Behavioral Health Follow Up Visit  MRN: [id number] Name: Thomas Gardner  Number of [place] Clinician visits: 4/6 Session Start time: 10:40  Session End time: 11:20 Total time: 40 minutes  Type of Service: [place] Interpretor:No.   SUBJECTIVE: Thomas Gardner is a 63 y.o. male  whom attended the session individually.  Patient was referred by Dr. Shan Levans for anxiety and depression. Patient reports the following symptoms/concerns: depression, co-dependency, anxiety, and work stressors. Duration of problem: recurrent for many years; Severity of problem: moderate  OBJECTIVE: Mood: Anxious and Affect: Appropriate Risk of harm to self or others: No plan to harm self or others  GOALS ADDRESSED: Patient will: 1.  Reduce symptoms of: agitation, anxiety, depression, insomnia and stress  2.  Increase knowledge and/or ability of: coping skills, healthy habits, self-management skills and stress reduction  3.  Demonstrate ability to: Increase healthy adjustment to current life circumstances, Increase adequate support systems for patient/family and Increase motivation to adhere to plan of care  INTERVENTIONS: Interventions utilized:  Brief CBT, Supportive Counseling and Sleep Hygiene Standardized Assessments completed: assessed for SI, HI, and self-harm.  ASSESSMENT: Patient currently experiencing mild to moderate levels of anxiety. Patient reported he is continuing to put healthy coping skills and goals in place in order to reduce anxiety and depression. Patient is continuing to have stress in the work place, but has identified that small breaks assist him in reducing his stress levels temporary. Patient identified triggers for stress, and is learning how to challenge his anxious thoughts.    Patient may benefit from counseling.  PLAN: 1. Follow up with behavioral health clinician on : two weeks.   Dessie Coma, [facility],  [place]

## 2019-04-07 ENCOUNTER — Encounter: Payer: Self-pay | Admitting: Licensed Clinical Social Worker

## 2019-04-08 ENCOUNTER — Other Ambulatory Visit: Payer: Self-pay | Admitting: Internal Medicine

## 2019-04-08 DIAGNOSIS — J4520 Mild intermittent asthma, uncomplicated: Secondary | ICD-10-CM

## 2019-04-19 ENCOUNTER — Encounter: Payer: Self-pay | Admitting: Internal Medicine

## 2019-04-19 ENCOUNTER — Ambulatory Visit (INDEPENDENT_AMBULATORY_CARE_PROVIDER_SITE_OTHER): Payer: Self-pay | Admitting: Internal Medicine

## 2019-04-19 ENCOUNTER — Other Ambulatory Visit: Payer: Self-pay

## 2019-04-19 VITALS — BP 164/86 | HR 76 | Temp 98.4°F | Ht 66.0 in | Wt 246.6 lb

## 2019-04-19 DIAGNOSIS — Z79899 Other long term (current) drug therapy: Secondary | ICD-10-CM

## 2019-04-19 DIAGNOSIS — K439 Ventral hernia without obstruction or gangrene: Secondary | ICD-10-CM

## 2019-04-19 DIAGNOSIS — I10 Essential (primary) hypertension: Secondary | ICD-10-CM

## 2019-04-19 DIAGNOSIS — E669 Obesity, unspecified: Secondary | ICD-10-CM

## 2019-04-19 DIAGNOSIS — E0800 Diabetes mellitus due to underlying condition with hyperosmolarity without nonketotic hyperglycemic-hyperosmolar coma (NKHHC): Secondary | ICD-10-CM

## 2019-04-19 DIAGNOSIS — I110 Hypertensive heart disease with heart failure: Secondary | ICD-10-CM

## 2019-04-19 DIAGNOSIS — Z794 Long term (current) use of insulin: Secondary | ICD-10-CM

## 2019-04-19 DIAGNOSIS — Z6839 Body mass index (BMI) 39.0-39.9, adult: Secondary | ICD-10-CM

## 2019-04-19 DIAGNOSIS — I509 Heart failure, unspecified: Secondary | ICD-10-CM

## 2019-04-19 LAB — POCT GLYCOSYLATED HEMOGLOBIN (HGB A1C): Hemoglobin A1C: 9.8 % — AB (ref 4.0–5.6)

## 2019-04-19 LAB — GLUCOSE, CAPILLARY: Glucose-Capillary: 239 mg/dL — ABNORMAL HIGH (ref 70–99)

## 2019-04-19 MED ORDER — CHLORTHALIDONE 25 MG PO TABS: 25.0000 mg | ORAL_TABLET | Freq: Every day | ORAL | 1 refills | Status: DC

## 2019-04-19 MED ORDER — INSULIN ASPART 100 UNIT/ML FLEXPEN: 12.0000 [IU] | PEN_INJECTOR | Freq: Three times a day (TID) | SUBCUTANEOUS | 1 refills | Status: DC

## 2019-04-19 MED ORDER — CARVEDILOL 25 MG PO TABS: 25.0000 mg | ORAL_TABLET | Freq: Two times a day (BID) | ORAL | 2 refills | Status: DC

## 2019-04-19 MED ORDER — LOSARTAN POTASSIUM 50 MG PO TABS: 50.0000 mg | ORAL_TABLET | Freq: Every day | ORAL | 1 refills | Status: DC

## 2019-04-19 MED ORDER — LIRAGLUTIDE 18 MG/3ML ~~LOC~~ SOPN: 1.2000 mg | PEN_INJECTOR | Freq: Every day | SUBCUTANEOUS | 2 refills | Status: DC

## 2019-04-19 MED ORDER — LANTUS SOLOSTAR 100 UNIT/ML ~~LOC~~ SOPN: 20.0000 [IU] | PEN_INJECTOR | Freq: Two times a day (BID) | SUBCUTANEOUS | 1 refills | Status: DC

## 2019-04-19 MED FILL — CHLORTHALIDONE 25 MG TABS: 25 | 30 days supply | Qty: 30 | Fill #0

## 2019-04-19 MED FILL — LOSARTAN POTASSIUM 50 MG TA: 50 | 30 days supply | Qty: 30 | Fill #0

## 2019-04-19 MED FILL — CARVEDILOL 25 MG TABLET: 25 | 30 days supply | Qty: 60 | Fill #0

## 2019-04-19 NOTE — Progress Notes (Signed)
   CC: HTN, T2DM follow up  HPI:  [name] is a 63 y.o. male with PMH below.  Today we will address HTN T2DM follow up  Please see A&P for status of the patient's chronic medical conditions  Past Medical History:  Diagnosis Date  . Arthritis   . Asthma   . CHF (congestive heart failure) ([place])   . Diabetes mellitus 2003  . GERD (gastroesophageal reflux disease)   . Hyperlipidemia   . Hypertension   . Morbid obesity ([place])   . Sleep apnea    Review of Systems:  ROS: Pulmonary: pt denies increased work of breathing, shortness of breath,  Cardiac: pt denies palpitations, chest pain,  Abdominal: pt denies abdominal pain, nausea, vomiting, or diarrhea   Physical Exam:  Vitals:   04/19/19 1555  BP: (!) 164/86  Pulse: 76  Temp: 98.4 F (36.9 C)  TempSrc: Oral  SpO2: 98%  Weight: 246 lb 9.6 oz (111.9 kg)  Height: 5\' 6"  (1.676 m)   Cardiac:normal rate and rhythm, clear s1 and s2, no murmurs, rubs or gallops, no LE edema Pulmonary: CTAB, not in distress Abdominal: non distended abdomen, soft and nontender, small epigastric Psych: Alert, conversant, in good spirits   Social History   Socioeconomic History  . Marital status: Single    Spouse name: Not on file  . Number of children: Not on file  . Years of education: Not on file  . Highest education level: Not on file  Occupational History  . Occupation: Insurance claims handler  Social Needs  . Financial resource strain: Not on file  . Food insecurity    Worry: Not on file    Inability: Not on file  . Transportation needs    Medical: Not on file    Non-medical: Not on file  Tobacco Use  . Smoking status: Never Smoker  . Smokeless tobacco: Never Used  Substance and Sexual Activity  . Alcohol use: No    Alcohol/week: 0.0 standard drinks  . Drug use: No  . Sexual activity: Not on file  Lifestyle  . Physical activity    Days per week: Not on file    Minutes per session: Not on file  . Stress: Not on file   Relationships  . Social Herbalist on phone: Not on file    Gets together: Not on file    Attends religious service: Not on file    Active member of club or organization: Not on file    Attends meetings of clubs or organizations: Not on file    Relationship status: Not on file  . Intimate partner violence    Fear of current or ex partner: Not on file    Emotionally abused: Not on file    Physically abused: Not on file    Forced sexual activity: Not on file  Other Topics Concern  . Not on file  Social History Narrative  . Not on file    Family History  Problem Relation Age of Onset  . Cancer Mother        ovarian  . Diabetes Mother   . Stroke Father   . Cancer Maternal Aunt        breast  . Diabetes Maternal Aunt   . Cancer Maternal Uncle        colon    Assessment & Plan:   See Encounters Tab for problem based charting.  Patient discussed with Dr. Daryll Drown

## 2019-04-19 NOTE — Patient Instructions (Addendum)
Thomas Gardner, I have restarted your diabetes medications victoza 1.2mg  daily and lantus 20units twice daily. Please keep up the good work on your diet and exercise.

## 2019-04-20 DIAGNOSIS — K439 Ventral hernia without obstruction or gangrene: Secondary | ICD-10-CM | POA: Insufficient documentation

## 2019-04-20 DIAGNOSIS — R1013 Epigastric pain: Secondary | ICD-10-CM | POA: Insufficient documentation

## 2019-04-20 NOTE — Assessment & Plan Note (Addendum)
Of note pt never with symptoms of congestive heart failure or admission for this issue nuclear stress can be inaccurate for EF estimation.  His chlorthalidone refill is somehow linked to this diagnosis.    Pt requires refills on medications with associated diagnosis above.  Reviewed disease process and find this medication to be necessary, will not change dose or alter current therapy.

## 2019-04-20 NOTE — Assessment & Plan Note (Addendum)
bp poorly controlled today.  He has had trouble with taking his medications consistently largely due to access as his medication assistance lapsed.  Today he is out of carvedilol, chlorthalidone and losartan.    -refilled regimen above can titrate when pt is on them consistently

## 2019-04-20 NOTE — Assessment & Plan Note (Signed)
Pt reports lump in epigastric area feels it has grown in size slightly.  He has had this for around 15 years.  On examination there is no visible protrusion however on palpation I can feel a raised area subdermally.  It was slightly tender to deep palpation.  I performed point of care ultrasound to evaluate the area which appeared to be an arch like area through the rectus muscle inside was hypoechoic peritoneal fluid.  I turned on color flow doppler and as expected the area was avascular.  Likely has increased in size over time due to patient's obesity and the aging process.    -continue to monitor

## 2019-04-20 NOTE — Assessment & Plan Note (Signed)
Currently prescribed lantus 20 BID and Novolog 8 TID with meals. Was out of victoza and came by clinic received xultophy said he did well on this but ran out.  Had some left over lantus and novolog and increased his novolog to 12 TID with meals, took lantus at 20 BID as before. Stopped invokana due to candidal infection.  He can never tolerate metformin and will not try again.  He has now recertified his health department medication assistance.  They do have victoza there.    -start back victoza at 1.2mg  daily -continue lantus 20 BID and Novolog 12 TID AC

## 2019-04-22 ENCOUNTER — Ambulatory Visit: Payer: Self-pay | Admitting: Licensed Clinical Social Worker

## 2019-04-22 NOTE — Progress Notes (Signed)
Internal Medicine Clinic Attending  Case discussed with Dr. Winfrey  at the time of the visit.  We reviewed the resident's history and exam and pertinent patient test results.  I agree with the assessment, diagnosis, and plan of care documented in the resident's note.  

## 2019-04-29 ENCOUNTER — Ambulatory Visit (INDEPENDENT_AMBULATORY_CARE_PROVIDER_SITE_OTHER): Payer: Self-pay | Admitting: Licensed Clinical Social Worker

## 2019-04-29 ENCOUNTER — Other Ambulatory Visit: Payer: Self-pay

## 2019-04-29 DIAGNOSIS — F419 Anxiety disorder, unspecified: Secondary | ICD-10-CM

## 2019-04-29 DIAGNOSIS — R4589 Other symptoms and signs involving emotional state: Secondary | ICD-10-CM

## 2019-05-03 ENCOUNTER — Encounter: Payer: Self-pay | Admitting: Internal Medicine

## 2019-05-04 ENCOUNTER — Encounter: Payer: Self-pay | Admitting: Licensed Clinical Social Worker

## 2019-05-04 NOTE — BH Specialist Note (Signed)
Integrated Behavioral Health Follow Up Visit  MRN: [id number] Name: Thomas Gardner  Number of [place] Clinician visits: 5/6 Session Start time: 10:40  Session End time: 11:20 Total time: 40  minutes  Type of Service: [place] Interpretor:No.   SUBJECTIVE: Thomas Gardner is a 63 y.o. male  whom attended the session individually.  Patient was referred by Dr. Shan Levans for anxiety and depression. Patient reports the following symptoms/concerns: depression, anxiety, work stressors, and health challenges. Duration of problem: recurrent for many years; Severity of problem: moderate  OBJECTIVE: Mood: Netural and Affect: Appropriate Risk of harm to self or others: No plan to harm self or others  GOALS ADDRESSED: Patient will: 1.  Reduce symptoms of: anxiety, depression, stress and grief.  2.  Increase knowledge and/or ability of: coping skills, healthy habits, self-management skills and stress reduction  3.  Demonstrate ability to: Increase healthy adjustment to current life circumstances, Increase adequate support systems for patient/family, Increase motivation to adhere to plan of care and Begin healthy grieving over loss  INTERVENTIONS: Interventions utilized:  Motivational Interviewing, Behavioral Activation, Brief CBT and Supportive Counseling Standardized Assessments completed: Not Needed  ASSESSMENT: Patient currently experiencing ongoing moderate levels of depression and anxiety. Patient reported he recently fell, and is recovering from the fall. Patient processed stressors in his work environments, and how he is continuing to try to reduce anxiety while at work. Patient is continuing to make positive life style changes in order to achieve his health goals.    Patient may benefit from counseling.  PLAN: 1. Follow up with behavioral health clinician on : one month.  Dessie Coma, [facility], [place]

## 2019-05-20 MED FILL — CARVEDILOL 25 MG TABLET: 25 | 30 days supply | Qty: 60 | Fill #6

## 2019-05-20 MED FILL — CHLORTHALIDONE 25 MG TABS: 25 | 30 days supply | Qty: 30 | Fill #1

## 2019-05-20 MED FILL — LOSARTAN POTASSIUM 50 MG TA: 50 | 30 days supply | Qty: 30 | Fill #1

## 2019-05-27 ENCOUNTER — Other Ambulatory Visit: Payer: Self-pay

## 2019-05-27 ENCOUNTER — Encounter: Payer: Self-pay | Admitting: Licensed Clinical Social Worker

## 2019-05-27 ENCOUNTER — Ambulatory Visit (INDEPENDENT_AMBULATORY_CARE_PROVIDER_SITE_OTHER): Payer: Self-pay | Admitting: Licensed Clinical Social Worker

## 2019-05-27 DIAGNOSIS — F419 Anxiety disorder, unspecified: Secondary | ICD-10-CM

## 2019-05-27 DIAGNOSIS — R4589 Other symptoms and signs involving emotional state: Secondary | ICD-10-CM

## 2019-05-27 NOTE — BH Specialist Note (Signed)
Integrated Behavioral Health Follow Up Visit  MRN: [id number] Name: SUAN LINSEY  Number of [place] Clinician visits: 6/6 Session Start time: 10:10  Session End time: 11:00 Total time: 50  minutes  Type of Service: Cook: MOUSTAPHA PALKO is a 64 y.o. malewhom attended the session individually.  Patient was referred by Dr. Shan Levans for anxiety and depression. Patient reports the following symptoms/concerns: depression, anxiety, work stressors, grief, and health challenges. Duration of problem: recurrent for many years; Severity of problem: moderate  OBJECTIVE: Mood: netural and Affect: Appropriate Risk of harm to self or others: No plan to harm self or others  GOALS ADDRESSED: Patient will: 1.  Reduce symptoms of: agitation, anxiety, depression and stress  2.  Increase knowledge and/or ability of: coping skills, healthy habits and stress reduction  3.  Demonstrate ability to: Increase healthy adjustment to current life circumstances, Increase adequate support systems for patient/family, Increase motivation to adhere to plan of care and Begin healthy grieving over loss  INTERVENTIONS: Interventions utilized:  Motivational Interviewing, Solution-Focused Strategies, Mindfulness or Psychologist, educational, Brief CBT and Supportive Counseling Standardized Assessments completed: assessed for SI, HI, and self-harm.  ASSESSMENT: Patient currently experiencing increased anxiety and depression since the previous session. Patient described it has "good and bad days'. Patient has lost some individuals in his life recently, and is processing that grief. Patient identified triggers for anxiety and depression. Patient identified the need to feel and be productive. Patient identified what areas of his life he would like to increase productivity, and formulated a plan to put his goals in action. Patient is not present at  times, and this is likely due to his increased anxiety and depression (history of trauma).    Patient may benefit from counseling.  PLAN: 1. Follow up with behavioral health clinician on : two weeks.  Dessie Coma, [facility], [place]

## 2019-06-10 ENCOUNTER — Ambulatory Visit: Payer: Self-pay | Admitting: Licensed Clinical Social Worker

## 2019-06-14 ENCOUNTER — Encounter: Payer: Self-pay | Admitting: Internal Medicine

## 2019-06-21 ENCOUNTER — Other Ambulatory Visit: Payer: Self-pay | Admitting: Internal Medicine

## 2019-06-21 DIAGNOSIS — I509 Heart failure, unspecified: Secondary | ICD-10-CM

## 2019-06-21 DIAGNOSIS — I10 Essential (primary) hypertension: Secondary | ICD-10-CM

## 2019-06-21 MED FILL — CARVEDILOL 25 MG TABLET: 25 | 30 days supply | Qty: 60 | Fill #7

## 2019-06-22 MED FILL — LOSARTAN POTASSIUM 50 MG TA: 50 | 30 days supply | Qty: 30 | Fill #0

## 2019-06-22 MED FILL — CHLORTHALIDONE 25 MG TABS: 25 | 30 days supply | Qty: 30 | Fill #0

## 2019-06-23 ENCOUNTER — Ambulatory Visit: Payer: Self-pay | Admitting: Licensed Clinical Social Worker

## 2019-07-13 ENCOUNTER — Ambulatory Visit: Payer: Self-pay | Admitting: Licensed Clinical Social Worker

## 2019-07-22 MED FILL — LOSARTAN POTASSIUM 50 MG TA: 50 | 30 days supply | Qty: 30 | Fill #1

## 2019-07-22 MED FILL — CHLORTHALIDONE 25 MG TABS: 25 | 30 days supply | Qty: 30 | Fill #1

## 2019-07-22 MED FILL — CARVEDILOL 25 MG TABLET: 25 | 30 days supply | Qty: 60 | Fill #8

## 2019-08-03 ENCOUNTER — Ambulatory Visit (INDEPENDENT_AMBULATORY_CARE_PROVIDER_SITE_OTHER): Payer: Self-pay | Admitting: Licensed Clinical Social Worker

## 2019-08-03 ENCOUNTER — Other Ambulatory Visit: Payer: Self-pay

## 2019-08-03 ENCOUNTER — Encounter: Payer: Self-pay | Admitting: Licensed Clinical Social Worker

## 2019-08-03 DIAGNOSIS — F419 Anxiety disorder, unspecified: Secondary | ICD-10-CM

## 2019-08-03 DIAGNOSIS — R4589 Other symptoms and signs involving emotional state: Secondary | ICD-10-CM

## 2019-08-03 NOTE — BH Specialist Note (Signed)
Thomas Gardner Visit via Telemedicine (Telephone)  08/03/2019 Thomas Gardner [id number]   Session Start time: 9:00  Session End time: 9:30 Total time: 30 minutes  Referring Provider: Dr. Shan Levans Type of Visit: Telephonic Patient location: Home [facility] Provider location: Office All persons participating in visit: Patient and [facility]  Confirmed patient's address: Yes  Confirmed patient's phone number: Yes  Any changes to demographics: No   Discussed confidentiality: Yes    The following statements were read to the patient and/or legal guardian that are established with the [facility] Provider.  "The purpose of this phone visit is to provide behavioral health care while limiting exposure to the coronavirus (COVID19).  There is a possibility of technology failure and discussed alternative modes of communication if that failure occurs."  "By engaging in this telephone visit, you consent to the provision of healthcare.  Additionally, you authorize for your insurance to be billed for the services provided during this telephone visit."   Patient and/or legal guardian consented to telephone visit: Yes   PRESENTING CONCERNS: Patient and/or family reports the following symptoms/concerns: depression, anxiety, work stressors, grief, and health issues. Duration of problem: recurrent for many years; Severity of problem: moderate  GOALS ADDRESSED: Patient will: 1.  Reduce symptoms of: agitation, anxiety, depression and stress  2.  Increase knowledge and/or ability of: coping skills, healthy habits and stress reduction  3.  Demonstrate ability to: Increase healthy adjustment to current life circumstances, Increase adequate support systems for patient/family and Increase motivation to adhere to plan of care  INTERVENTIONS: Interventions utilized:  Motivational Interviewing, Brief CBT, Supportive Counseling and Sleep Hygiene Standardized Assessments completed: assessed for SI, HI,  and self-harm.  ASSESSMENT: Patient currently experiencing    Patient may benefit from moderate levels of depression and anxiety. Patient reported he is pain, and this is causing fatigue and anxiety regarding. Patient is spending time worrying about his physical health issues. Patient identified he is spending most of his free time in his bed due to lack of motivation. Patient denies any thoughts of self-harm. Patient recognizes that he needs to reduce the amount of time he is sleeping throughout the day, and could benefit from implementing more structure into his day.   Goals: Make an appointment with Dr. Shan Levans to discuss health concerns. Lose 5 lbs over the next month.   PLAN: 1. Follow up with behavioral health clinician on : two weeks.  Dessie Coma, [facility], [place]

## 2019-08-19 ENCOUNTER — Other Ambulatory Visit: Payer: Self-pay | Admitting: Internal Medicine

## 2019-08-19 DIAGNOSIS — I10 Essential (primary) hypertension: Secondary | ICD-10-CM

## 2019-08-19 MED FILL — CHLORTHALIDONE 25 MG TABS: 25 | 30 days supply | Qty: 30 | Fill #2

## 2019-08-19 MED FILL — LOSARTAN POTASSIUM 50 MG TA: 50 | 30 days supply | Qty: 30 | Fill #2

## 2019-08-19 MED FILL — CARVEDILOL 25 MG TABLET: 25 | 30 days supply | Qty: 60 | Fill #0

## 2019-09-07 ENCOUNTER — Ambulatory Visit: Payer: Self-pay | Admitting: Licensed Clinical Social Worker

## 2019-09-09 ENCOUNTER — Encounter: Payer: Self-pay | Admitting: *Deleted

## 2019-09-17 MED FILL — CHLORTHALIDONE 25 MG TABS: 25 | 30 days supply | Qty: 30 | Fill #3

## 2019-09-17 MED FILL — CARVEDILOL 25 MG TABLET: 25 | 30 days supply | Qty: 60 | Fill #1

## 2019-09-17 MED FILL — LOSARTAN POTASSIUM 50 MG TA: 50 | 30 days supply | Qty: 30 | Fill #3

## 2019-10-07 ENCOUNTER — Other Ambulatory Visit: Payer: Self-pay | Admitting: Internal Medicine

## 2019-10-20 MED FILL — CHLORTHALIDONE 25 MG TABS: 25 | 30 days supply | Qty: 30 | Fill #4

## 2019-10-20 MED FILL — LOSARTAN POTASSIUM 50 MG TA: 50 | 30 days supply | Qty: 30 | Fill #4

## 2019-10-20 MED FILL — CARVEDILOL 25 MG TABLET: 25 | 30 days supply | Qty: 60 | Fill #2

## 2019-11-19 ENCOUNTER — Other Ambulatory Visit: Payer: Self-pay | Admitting: Internal Medicine

## 2019-11-19 DIAGNOSIS — I10 Essential (primary) hypertension: Secondary | ICD-10-CM

## 2019-11-19 DIAGNOSIS — I509 Heart failure, unspecified: Secondary | ICD-10-CM

## 2019-11-19 MED FILL — CARVEDILOL 25 MG TABLET: 25 | 30 days supply | Qty: 60 | Fill #3

## 2019-11-19 MED FILL — CHLORTHALIDONE 25 MG TABS: 25 | 30 days supply | Qty: 30 | Fill #0

## 2019-11-23 ENCOUNTER — Other Ambulatory Visit: Payer: Self-pay | Admitting: Internal Medicine

## 2019-11-23 DIAGNOSIS — I10 Essential (primary) hypertension: Secondary | ICD-10-CM

## 2019-11-23 MED ORDER — LOSARTAN POTASSIUM 50 MG PO TABS: 50.0000 mg | ORAL_TABLET | Freq: Every day | ORAL | 1 refills | Status: DC

## 2019-11-23 MED FILL — LOSARTAN POTASSIUM 50 MG TA: 50 | 30 days supply | Qty: 30 | Fill #0

## 2019-12-15 ENCOUNTER — Ambulatory Visit: Payer: Self-pay | Admitting: Licensed Clinical Social Worker

## 2019-12-22 ENCOUNTER — Ambulatory Visit (INDEPENDENT_AMBULATORY_CARE_PROVIDER_SITE_OTHER): Payer: Self-pay | Admitting: Licensed Clinical Social Worker

## 2019-12-22 ENCOUNTER — Other Ambulatory Visit: Payer: Self-pay

## 2019-12-22 DIAGNOSIS — F329 Major depressive disorder, single episode, unspecified: Secondary | ICD-10-CM

## 2019-12-22 DIAGNOSIS — F419 Anxiety disorder, unspecified: Secondary | ICD-10-CM

## 2019-12-22 DIAGNOSIS — R4589 Other symptoms and signs involving emotional state: Secondary | ICD-10-CM

## 2019-12-22 NOTE — BH Specialist Note (Signed)
Integrated Behavioral Health Follow Up Visit  MRN: [id number] Name: Thomas Gardner   Session Start time: 9:00  Session End time: 9:45 Total time: 45  minutes  Type of Service: [place] Interpretor:No.  SUBJECTIVE: Thomas Gardner is a 64 y.o. male  whom attended the sesssion individually. Patient was referred by Dr. Shan Levans for depression and anxiety Patient reports the following symptoms/concerns:depression, anxiety, work stressors, grief, and health issues.  Duration of problem: recurrent for many years Severity of problem: moderate  OBJECTIVE: Mood: Depressed and Affect: Depressed Risk of harm to self or others: No plan to harm self or others  LIFE CONTEXT: Family and Social: Patient is isolating. School/Work: Continuing to work. 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  Self-Care: Decrease taking naps during the day time.  GOALS ADDRESSED: Patient will: 1.  Reduce symptoms of: anxiety, depression and stress  2.  Increase knowledge and/or ability of: coping skills, healthy habits and stress reduction  3.  Demonstrate ability to: Increase healthy adjustment to current life circumstances, Increase adequate support systems for patient/family and Increase motivation to adhere to plan of care  INTERVENTIONS: Interventions utilized:  Motivational Interviewing, Brief CBT, Supportive Counseling and Sleep Hygiene Standardized Assessments completed: assessed for SI, HI, and self-harm.  ASSESSMENT: Patient currently experiencing moderate levels of depression and anxiety. Patient processed recent stressors. Patient reported that he is isolating more, lost interest in activities and interacting with others, and he has reduced his motivation for improved self-care. Patient acknowledged that he is avoiding tasks that require social interaction or physical exertion from him. Patient did agree that this is not healthy  for him to continue this cycle, and he needs to leave his bedroom more often.   Patient denies any thoughts of harming himself or others. 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 Patient is also stressed about his fiances and housing situation. Patient did develop a plan today in therapy to accomplish some tasks that need to be completed.   Patient may benefit from counseling.  PLAN: 1. Follow up with behavioral health clinician on : two weeks to one month.   Dessie Coma, [facility], [place]

## 2019-12-24 MED FILL — LOSARTAN POTASSIUM 50 MG TA: 50 | 30 days supply | Qty: 30 | Fill #1

## 2019-12-24 MED FILL — CARVEDILOL 25 MG TABLET: 25 | 30 days supply | Qty: 60 | Fill #4

## 2019-12-24 MED FILL — CHLORTHALIDONE 25 MG TABS: 25 | 30 days supply | Qty: 30 | Fill #1

## 2020-01-11 ENCOUNTER — Encounter: Payer: Self-pay | Admitting: Licensed Clinical Social Worker

## 2020-01-19 ENCOUNTER — Encounter: Payer: Self-pay | Admitting: Internal Medicine

## 2020-01-19 ENCOUNTER — Other Ambulatory Visit: Payer: Self-pay

## 2020-01-19 ENCOUNTER — Other Ambulatory Visit: Payer: Self-pay | Admitting: Internal Medicine

## 2020-01-19 ENCOUNTER — Ambulatory Visit (INDEPENDENT_AMBULATORY_CARE_PROVIDER_SITE_OTHER): Payer: Self-pay | Admitting: Internal Medicine

## 2020-01-19 ENCOUNTER — Ambulatory Visit (INDEPENDENT_AMBULATORY_CARE_PROVIDER_SITE_OTHER): Payer: Self-pay | Admitting: Licensed Clinical Social Worker

## 2020-01-19 ENCOUNTER — Telehealth: Payer: Self-pay

## 2020-01-19 ENCOUNTER — Ambulatory Visit (HOSPITAL_COMMUNITY)
Admission: RE | Admit: 2020-01-19 | Discharge: 2020-01-19 | Disposition: A | Discharge status: Home / Self Care | Payer: Self-pay | Source: Ambulatory Visit | Attending: Student in an Organized Health Care Education/Training Program | Admitting: Student in an Organized Health Care Education/Training Program

## 2020-01-19 VITALS — BP 158/83 | HR 90 | Temp 98.7°F | Ht 66.0 in | Wt 251.0 lb

## 2020-01-19 DIAGNOSIS — F419 Anxiety disorder, unspecified: Secondary | ICD-10-CM

## 2020-01-19 DIAGNOSIS — J4520 Mild intermittent asthma, uncomplicated: Secondary | ICD-10-CM

## 2020-01-19 DIAGNOSIS — I10 Essential (primary) hypertension: Secondary | ICD-10-CM

## 2020-01-19 DIAGNOSIS — I509 Heart failure, unspecified: Secondary | ICD-10-CM

## 2020-01-19 DIAGNOSIS — Z794 Long term (current) use of insulin: Secondary | ICD-10-CM

## 2020-01-19 DIAGNOSIS — R4589 Other symptoms and signs involving emotional state: Secondary | ICD-10-CM

## 2020-01-19 DIAGNOSIS — R221 Localized swelling, mass and lump, neck: Secondary | ICD-10-CM | POA: Insufficient documentation

## 2020-01-19 DIAGNOSIS — R1013 Epigastric pain: Secondary | ICD-10-CM

## 2020-01-19 DIAGNOSIS — E0800 Diabetes mellitus due to underlying condition with hyperosmolarity without nonketotic hyperglycemic-hyperosmolar coma (NKHHC): Secondary | ICD-10-CM

## 2020-01-19 DIAGNOSIS — E785 Hyperlipidemia, unspecified: Secondary | ICD-10-CM

## 2020-01-19 DIAGNOSIS — Z6841 Body Mass Index (BMI) 40.0 and over, adult: Secondary | ICD-10-CM

## 2020-01-19 DIAGNOSIS — K439 Ventral hernia without obstruction or gangrene: Secondary | ICD-10-CM

## 2020-01-19 DIAGNOSIS — I110 Hypertensive heart disease with heart failure: Secondary | ICD-10-CM

## 2020-01-19 DIAGNOSIS — K219 Gastro-esophageal reflux disease without esophagitis: Secondary | ICD-10-CM

## 2020-01-19 DIAGNOSIS — F329 Major depressive disorder, single episode, unspecified: Secondary | ICD-10-CM

## 2020-01-19 LAB — POCT GLYCOSYLATED HEMOGLOBIN (HGB A1C): Hemoglobin A1C: 11 % — AB (ref 4.0–5.6)

## 2020-01-19 LAB — GLUCOSE, CAPILLARY: Glucose-Capillary: 302 mg/dL — ABNORMAL HIGH (ref 70–99)

## 2020-01-19 MED ORDER — LANTUS SOLOSTAR 100 UNIT/ML ~~LOC~~ SOPN: 20.0000 [IU] | PEN_INJECTOR | Freq: Two times a day (BID) | SUBCUTANEOUS | 2 refills | Status: DC

## 2020-01-19 MED ORDER — CHLORTHALIDONE 25 MG PO TABS: 25.0000 mg | ORAL_TABLET | Freq: Every day | ORAL | 1 refills | Status: DC

## 2020-01-19 MED ORDER — UNIFINE PENTIPS 31G X 5 MM MISC: 1 refills | Status: DC

## 2020-01-19 MED ORDER — CARVEDILOL 25 MG PO TABS: 25.0000 mg | ORAL_TABLET | Freq: Two times a day (BID) | ORAL | 1 refills | Status: DC

## 2020-01-19 MED ORDER — INSULIN ASPART 100 UNIT/ML FLEXPEN: 12.0000 [IU] | PEN_INJECTOR | Freq: Three times a day (TID) | SUBCUTANEOUS | 1 refills | Status: DC

## 2020-01-19 MED ORDER — LIRAGLUTIDE 18 MG/3ML ~~LOC~~ SOPN: 1.2000 mg | PEN_INJECTOR | Freq: Every day | SUBCUTANEOUS | 2 refills | Status: DC

## 2020-01-19 MED ORDER — XULTOPHY 100-3.6 UNIT-MG/ML ~~LOC~~ SOPN: 35.0000 [IU] | PEN_INJECTOR | Freq: Every day | SUBCUTANEOUS | 3 refills | Status: DC

## 2020-01-19 MED ORDER — LOSARTAN POTASSIUM 50 MG PO TABS: 50.0000 mg | ORAL_TABLET | Freq: Every day | ORAL | 1 refills | Status: DC

## 2020-01-19 MED FILL — UNIFINE PENTIPS 31GX3/16: 31G X 5 MM | 20 days supply | Qty: 100 | Fill #0

## 2020-01-19 MED FILL — VICTOZA 2-PAK 18 MG/3 ML PE: 18 | 30 days supply | Qty: 6 | Fill #0

## 2020-01-19 MED FILL — CHLORTHALIDONE 25 MG TABS: 25 | 30 days supply | Qty: 30 | Fill #0

## 2020-01-19 MED FILL — LANTUS SOLOSTAR 100 UNITS/M: 100 | 30 days supply | Qty: 12 | Fill #0

## 2020-01-19 MED FILL — CARVEDILOL 25 MG TABLET: 25 | 30 days supply | Qty: 60 | Fill #0

## 2020-01-19 MED FILL — LOSARTAN POTASSIUM 50 MG TA: 50 | 30 days supply | Qty: 30 | Fill #0

## 2020-01-19 MED FILL — NOVOLOG FLEXPEN SYRINGE: 100 | 25 days supply | Qty: 9 | Fill #0

## 2020-01-19 NOTE — Assessment & Plan Note (Addendum)
Diabetes mellitus-uncontrolled: Thomas Gardner's hemoglobin A1c today is 11% from 9.8% when last checked in November.  2020.  He has not been able to follow-up with the clinic in about a year because he states that it has been a stressful time for him.  In regards to his diabetes regimen, there has been a lot of changes and medication list was inconsistent.  He states that he takes Xultophy 35 units daily and NovoLog 12 units 3 times daily.  He checks his blood glucose at home and most of the time they are persistently high in the 300s.  However he states that when his blood sugar is around 140s, he began to experience lightheadedness and palpitation.  He also reports inconsistencies with taking his diabetes medications.  Assessment and plan: I had a long and in-depth conversation with Thomas Gardner regarding compliance to diabetes medications and glucose checks.  He was given refills today  -Continue Xultophy 35 units daily -Continue NovoLog 12 units 3 times daily -Check blood glucose 4 times a day -Follow-up in 1 week   ADDENDUM: It appears that Xultophy is too expensive and does not qualify for the medication assistance program through [facility] outpatient pharmacy.  Patient has been advised to take Lantus 20 units twice a day and Victoza 1.2 mg daily.  I will send a message to Cheryle Horsfall to assist with manufacturer assistance for Xultophy.

## 2020-01-19 NOTE — Assessment & Plan Note (Signed)
Hypertension-uncontrolled: BP Readings from Last 3 Encounters:  01/19/20 (!) 158/83  04/19/19 (!) 164/86  01/04/19 140/79    Plan: -Refills given for all his medications -Continue Coreg 25 mg twice daily -Continue chlorthalidone 25 mg daily -Continue losartan 50 mg daily -Follow-up clinic in 1 week

## 2020-01-19 NOTE — Progress Notes (Signed)
   CC: Follow-up diabetes mellitus, hypertension  HPI:  Thomas Gardner is a 64 y.o. gentleman with medical history significant for uncontrolled type 2 diabetes mellitus, hypertension, HFrEF EF 37% (nuclear medicine scan 2012), morbid obesity, GERD presenting for follow-up.  Please see problem based charting for further details.  Past Medical History:  Diagnosis Date  . Arthritis   . Asthma   . CHF (congestive heart failure) ([place])   . Diabetes mellitus 2003  . GERD (gastroesophageal reflux disease)   . Hyperlipidemia   . Hypertension   . Morbid obesity ([place])   . Sleep apnea    Review of Systems: As per HPI  Physical Exam:  Vitals:   01/19/20 0913  BP: (!) 158/83  Pulse: 90  Temp: 98.7 F (37.1 C)  TempSrc: Oral  SpO2: 99%  Weight: 251 lb (113.9 kg)  Height: 5\' 6"  (1.676 m)   Physical Exam Vitals reviewed.  Constitutional:      Appearance: He is obese.  Neck:     Comments: We were able to perform point-of-care ultrasound of the anterior neck mass which showed a 3 x 3 cm fluid-filled subcutaneous/Dermis mass. Nontender to palpation.  Thyroid was minimally enlarged on the right.  Point-of-care ultrasound also revealed several thyroid nodules on the right. Cardiovascular:     Rate and Rhythm: Normal rate.     Heart sounds: No murmur heard.   Pulmonary:     Breath sounds: Normal breath sounds. No wheezing.  Abdominal:     General: There is distension (Due to body habitus).     Tenderness: There is abdominal tenderness (Epigastric with bony prominence).  Musculoskeletal:     Cervical back: No rigidity or tenderness.  Skin:    General: Skin is warm.  Psychiatric:        Mood and Affect: Mood normal.     Assessment & Plan:   See Encounters Tab for problem based charting.  Patient seen with Dr. Evette Doffing

## 2020-01-19 NOTE — Telephone Encounter (Signed)
Spoke with patient on the phone today regarding Xultophy patient assistance.  I emailed application to patient as requested by patient today.

## 2020-01-19 NOTE — Assessment & Plan Note (Signed)
Anterior neck mass: He states that he has noticed progressively worsening anterior neck mass for the past year.  He denies any associated weight loss, diarrhea.  He states that sometimes he experiences pain however not severe.  He denies dysphagia or odontophagia.  We were able to perform point-of-care ultrasound of the anterior neck mass which showed a 3 x 3 cm fluid-filled subcutaneous/Dermis mass. Nontender to palpation.  Thyroid was minimally enlarged on the right.  Point-of-care ultrasound also revealed several thyroid nodules on the right.  Assessment and plan: Differential diagnosis includes inclusion cyst versus thyroglossal duct cyst versus thyroid nodule though very less likely  Plan: -Follow-up TSH -Follow-up thyroid ultrasound

## 2020-01-19 NOTE — Patient Instructions (Addendum)
He is taking only,  It was a pleasure taking care of you here in the clinic today.  Here my recommendations after our visit.  1.  For your diabetes, please take Xultophy 35 units in the morning and NovoLog 12 units 3 times a day.  Record your blood sugar 4 times a day.  I want you to come to the clinic in 1 week for follow-up 2.  For your blood pressure, I am not making any changes.  Please continue taking the Coreg, chlorthalidone and losartan 3.  For your neck mass, I have ordered for a thyroid lab, thyroid ultrasound. 4.  For your abdominal pain, cough order chest x-rays to evaluate the abdominal.  Taking. .Dr. Eileen Stanford  Please call the internal medicine center clinic if you have any questions or concerns, we may be able to help and keep you from a long and expensive emergency room wait. Our clinic and after hours phone number is [phone], the best time to call is Monday through Friday 9 am to 4 pm but there is always someone available 24/7 if you have an emergency. If you need medication refills please notify your pharmacy one week in advance and they will send [place] a request.

## 2020-01-19 NOTE — Assessment & Plan Note (Addendum)
Epigastric pain: Mr. Grandfield states that he has been experiencing epigastric pain for a while now.  Upon chart review, it appears he had this evaluated in April 19, 2019 and stated that it has been occurring for 15 years.  Point-of-care ultrasound was performed which revealed a hypoechoic fluid.  Mr. Bynum makes me aware that the mass has been progressively enlarging and tender to palpation.  A point-of-care ultrasound was performed during our visit today and was inconclusive.  Assessment: Likely bony deformity on the xiphoid process.  Plan: -Follow-up rib x-ray with focus on the xiphoid.   ADDENDUM: X-Ray: The xiphoid process is best seen on oblique view and while prominent, is otherwise unremarkable.

## 2020-01-19 NOTE — Addendum Note (Signed)
Addended by: Jean Rosenthal on: 01/19/2020 11:11 AM   Modules accepted: Orders

## 2020-01-20 ENCOUNTER — Encounter: Payer: Self-pay | Admitting: Internal Medicine

## 2020-01-20 LAB — TSH: TSH: 1.38 u[IU]/mL (ref 0.450–4.500)

## 2020-01-20 NOTE — Progress Notes (Signed)
Internal Medicine Clinic Attending  I saw and evaluated the patient.  I personally confirmed the key portions of the history and exam documented by Dr. Eileen Stanford and I reviewed pertinent patient test results.  The assessment, diagnosis, and plan were formulated together and I agree with the documentation in the resident's note.   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 Will see what formal ultrasound shows. If he wants it removed, will need to refer to ENT for that.

## 2020-01-20 NOTE — Telephone Encounter (Signed)
Thanks [employer] Appreciate your assistance.

## 2020-01-26 NOTE — BH Specialist Note (Signed)
Integrated Behavioral Health Follow Up Visit  MRN: [id number] Name: Thomas Gardner  Session Start time: 10:15  Session End time: 11:00 Total time: 45 minutes  Type of Service: [place] Interpretor:No.  SUBJECTIVE: Thomas Gardner is a 64 y.o. male accompanied by whom attended the session individually. Patient was referred by Dr. Eileen Stanford for depression and anxiety. Patient reports the following symptoms/concerns: depression, anxiety, work stressors, grief, and health issues. Financial stressors. Duration of problem: recurrent for many years, currently active; Severity of problem: moderate  OBJECTIVE: Mood: Depressed and Affect: Depressed and Tearful Risk of harm to self or others: No plan to harm self or others  LIFE CONTEXT: Family and Social: Patient is isolating more often.  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  Self-Care: Needs improvement.  Life Changes: Recently learned his SSI was paused, and that his checks will not resume in 2022.  GOALS ADDRESSED: Patient will: 1.  Reduce symptoms of: anxiety, depression and stress  2.  Increase knowledge and/or ability of: coping skills, healthy habits and stress reduction  3.  Demonstrate ability to: Increase healthy adjustment to current life circumstances, Increase adequate support systems for patient/family and Increase motivation to adhere to plan of care  INTERVENTIONS: Interventions utilized:  Motivational Interviewing, Mindfulness or Psychologist, educational, Brief CBT, Supportive Counseling and Sleep Hygiene Standardized Assessments completed: assessed for SI, HI, and self-harm.  ASSESSMENT: Patient currently experiencing moderate levels of depression. Patient did make improvements from the last session with following through on some projects he had identified in the previous session. Patient reported ongoing health issues, fatigue, and  lack of motivation. Patient recently learned that he lost his disability temporarily, and this situation has increased the patient's anxiety and depression symptoms. Patient processed his plan to try to cover his financial responsibilities.   Patient acknowledged that he cannot continue to lie in the bed as a way to cope.  Patient may benefit from continued counseling.  PLAN: 1. Follow up with behavioral health clinician on : with our [facility]  2. Behavioral recommendations: follow up with [facility] 3. Referral(s): [place] (In Clinic), [place], [facility], [facility]

## 2020-02-02 ENCOUNTER — Other Ambulatory Visit: Payer: Self-pay

## 2020-02-02 ENCOUNTER — Encounter: Payer: Self-pay | Admitting: Internal Medicine

## 2020-02-02 ENCOUNTER — Other Ambulatory Visit: Payer: Self-pay | Admitting: Internal Medicine

## 2020-02-02 ENCOUNTER — Ambulatory Visit (INDEPENDENT_AMBULATORY_CARE_PROVIDER_SITE_OTHER): Payer: Self-pay | Admitting: Internal Medicine

## 2020-02-02 DIAGNOSIS — E1100 Type 2 diabetes mellitus with hyperosmolarity without nonketotic hyperglycemic-hyperosmolar coma (NKHHC): Secondary | ICD-10-CM

## 2020-02-02 DIAGNOSIS — I10 Essential (primary) hypertension: Secondary | ICD-10-CM

## 2020-02-02 DIAGNOSIS — Z794 Long term (current) use of insulin: Secondary | ICD-10-CM

## 2020-02-02 MED ORDER — LIRAGLUTIDE 18 MG/3ML ~~LOC~~ SOPN: 1.8000 mg | PEN_INJECTOR | Freq: Every day | SUBCUTANEOUS | 2 refills | Status: DC

## 2020-02-02 MED ORDER — LOSARTAN POTASSIUM 50 MG PO TABS: 100.0000 mg | ORAL_TABLET | Freq: Every day | ORAL | 1 refills | Status: DC

## 2020-02-02 NOTE — Patient Instructions (Addendum)
Thomas Gardner,   Thanks for seeing me today.  Here my recommendations today.  1.  Increase Victoza to 1.8 mg daily 2.  Increase losartan 100 mg daily 3.  Continue checking your blood sugar 3 times a day 4.  I have referred you to Butch Penny our diabetes coordinator.  I will see you back here in 2 weeks.  Take care! Dr. Eileen Stanford  Please call the internal medicine center clinic if you have any questions or concerns, we may be able to help and keep you from a long and expensive emergency room wait. Our clinic and after hours phone number is [phone], the best time to call is Monday through Friday 9 am to 4 pm but there is always someone available 24/7 if you have an emergency. If you need medication refills please notify your pharmacy one week in advance and they will send [place] a request.

## 2020-02-02 NOTE — Assessment & Plan Note (Signed)
  Type 2 diabetes mellitus: Uncontrolled.  Last A1c was 11% during my visit on 01/19/2020.  We had a thorough discussion regarding been compliant to his medication regimen and sticking to a consistent diet.  He states that he does not have consistent work schedule which has been challenging for him to manage his diabetes.  Home CBGs have ranged in the 200s-300s.  Plan: -Increase Victoza to 1.8 mg daily -Continue Lantus 20 units twice daily -Continue NovoLog 12 units 3 times a day  Still waiting for approval for Xultophy from [facility].

## 2020-02-02 NOTE — Progress Notes (Signed)
Internal Medicine Clinic Attending  Case discussed with Dr. Agyei at the time of the visit.  We reviewed the resident's history and exam and pertinent patient test results.  I agree with the assessment, diagnosis, and plan of care documented in the resident's note.  Taleia Sadowski, M.D., Ph.D.  

## 2020-02-02 NOTE — Progress Notes (Signed)
   CC: Follow up DM  HPI:  [name] is a 63 y.o. with medical history significant for type 2 diabetes mellitus and hypertension presenting for follow-up.  Please see problem based charting for further details.   Past Medical History:  Diagnosis Date  . Arthritis   . Asthma   . CHF (congestive heart failure) ([place])   . Diabetes mellitus 2003  . GERD (gastroesophageal reflux disease)   . Hyperlipidemia   . Hypertension   . Morbid obesity ([place])   . Sleep apnea    Review of Systems:  As per HPI  Physical Exam:  Vitals:   02/02/20 0959  BP: (!) 141/70  Pulse: 87  Temp: 98.5 F (36.9 C)  TempSrc: Oral  SpO2: 99%  Weight: 250 lb 11.2 oz (113.7 kg)  Height: 5\' 6"  (1.676 m)   Physical Exam Constitutional:      Appearance: He is obese.  Cardiovascular:     Rate and Rhythm: Normal rate.     Heart sounds: No murmur heard.   Pulmonary:     Effort: Pulmonary effort is normal.     Breath sounds: Normal breath sounds. No rales.  Neurological:     Mental Status: He is alert.  Psychiatric:        Mood and Affect: Mood normal.     Assessment & Plan:   See Encounters Tab for problem based charting.  Patient discussed with Dr. Rebeca Alert

## 2020-02-02 NOTE — Assessment & Plan Note (Signed)
Hypertension: Uncontrolled.  My goal is less than 130/80  BP Readings from Last 3 Encounters:  02/02/20 (!) 141/70  01/19/20 (!) 158/83  04/19/19 (!) 164/86   Plan: -Continue Coreg 25 mg daily -Continue chlorthalidone 25 mg daily -Increase losartan 100 mg daily

## 2020-02-14 ENCOUNTER — Other Ambulatory Visit: Payer: Self-pay

## 2020-02-14 ENCOUNTER — Ambulatory Visit (HOSPITAL_COMMUNITY)
Admission: RE | Admit: 2020-02-14 | Discharge: 2020-02-14 | Disposition: A | Discharge status: Home / Self Care | Payer: Self-pay | Source: Ambulatory Visit | Attending: Student in an Organized Health Care Education/Training Program | Admitting: Student in an Organized Health Care Education/Training Program

## 2020-02-14 DIAGNOSIS — R1013 Epigastric pain: Secondary | ICD-10-CM | POA: Insufficient documentation

## 2020-02-15 ENCOUNTER — Other Ambulatory Visit: Payer: Self-pay | Admitting: Internal Medicine

## 2020-02-15 DIAGNOSIS — R221 Localized swelling, mass and lump, neck: Secondary | ICD-10-CM

## 2020-02-17 MED FILL — LOSARTAN POTASSIUM 50 MG TA: 50 | 30 days supply | Qty: 60 | Fill #0

## 2020-02-17 MED FILL — CARVEDILOL 25 MG TABLET: 25 | 30 days supply | Qty: 60 | Fill #0

## 2020-02-17 MED FILL — NOVOLOG FLEXPEN SYRINGE: 100 | 25 days supply | Qty: 9 | Fill #1

## 2020-02-17 MED FILL — VICTOZA 18 MG/3 ML INJECT P: 18 | 30 days supply | Qty: 9 | Fill #0

## 2020-02-17 MED FILL — CHLORTHALIDONE 25 MG TABS: 25 | 30 days supply | Qty: 30 | Fill #1

## 2020-02-23 ENCOUNTER — Encounter: Payer: Self-pay | Admitting: Internal Medicine

## 2020-02-25 ENCOUNTER — Telehealth: Payer: Self-pay | Admitting: *Deleted

## 2020-02-25 NOTE — Telephone Encounter (Signed)
CALLED PATIENT LVM FOR PATIENT TO RETURN CALL TO [facility] AT [phone] REGARDING HIS REFERRAL.

## 2020-02-28 ENCOUNTER — Encounter: Payer: Self-pay | Admitting: Dietician

## 2020-03-07 MED FILL — VICTOZA 2-PAK 18 MG/3 ML PE: 18 | 30 days supply | Qty: 6 | Fill #1

## 2020-03-07 MED FILL — LANTUS SOLOSTAR 100 UNITS/M: 100 | 30 days supply | Qty: 12 | Fill #1

## 2020-03-20 ENCOUNTER — Encounter: Payer: Self-pay | Admitting: Internal Medicine

## 2020-03-20 MED FILL — LOSARTAN POTASSIUM 50 MG TA: 50 | 30 days supply | Qty: 60 | Fill #1

## 2020-03-20 MED FILL — CHLORTHALIDONE 25 MG TABS: 25 | 30 days supply | Qty: 30 | Fill #2

## 2020-03-20 MED FILL — CARVEDILOL 25 MG TABLET: 25 | 30 days supply | Qty: 60 | Fill #5

## 2020-03-22 NOTE — Telephone Encounter (Signed)
Does this appt need to be in person or can it be done via telehealth?  Please advise.

## 2020-03-27 ENCOUNTER — Other Ambulatory Visit: Payer: Self-pay | Admitting: Internal Medicine

## 2020-03-27 ENCOUNTER — Other Ambulatory Visit: Payer: Self-pay

## 2020-03-27 ENCOUNTER — Ambulatory Visit: Payer: Self-pay | Admitting: Internal Medicine

## 2020-03-27 ENCOUNTER — Encounter: Payer: Self-pay | Admitting: Internal Medicine

## 2020-03-27 VITALS — BP 149/75 | HR 92 | Temp 98.3°F | Ht 66.0 in | Wt 251.4 lb

## 2020-03-27 DIAGNOSIS — Z794 Long term (current) use of insulin: Secondary | ICD-10-CM

## 2020-03-27 DIAGNOSIS — E0800 Diabetes mellitus due to underlying condition with hyperosmolarity without nonketotic hyperglycemic-hyperosmolar coma (NKHHC): Secondary | ICD-10-CM

## 2020-03-27 DIAGNOSIS — F411 Generalized anxiety disorder: Secondary | ICD-10-CM

## 2020-03-27 MED ORDER — SERTRALINE HCL 50 MG PO TABS
50.0000 mg | ORAL_TABLET | Freq: Every day | ORAL | 2 refills | Status: DC
Start: 2020-03-27 — End: 2020-03-27

## 2020-03-27 MED ORDER — XULTOPHY 100-3.6 UNIT-MG/ML ~~LOC~~ SOPN: 35.0000 [IU] | PEN_INJECTOR | Freq: Every day | SUBCUTANEOUS | 3 refills | Status: DC

## 2020-03-27 MED ORDER — SERTRALINE HCL 50 MG PO TABS: 50.0000 mg | ORAL_TABLET | Freq: Every day | ORAL | 2 refills | Status: DC

## 2020-03-27 MED ORDER — INSULIN ASPART 100 UNIT/ML FLEXPEN: 12.0000 [IU] | PEN_INJECTOR | Freq: Three times a day (TID) | SUBCUTANEOUS | 1 refills | Status: DC

## 2020-03-27 MED FILL — SERTRALINE HCL 50 MG TABLET: 50 | 30 days supply | Qty: 30 | Fill #0

## 2020-03-27 NOTE — Progress Notes (Signed)
   CC: Anxiety  HPI:  [name] is a 64 y.o. with a PMHx as listed below who presents to the clinic for anxiety.   Please see the Encounters tab for problem-based Assessment & Plan regarding status of patient's acute and chronic conditions.  Past Medical History:  Diagnosis Date  . Arthritis   . Asthma   . CHF (congestive heart failure) ([place])   . Diabetes mellitus 2003  . GERD (gastroesophageal reflux disease)   . Hyperlipidemia   . Hypertension   . Morbid obesity ([place])   . Sleep apnea    Review of Systems: Review of Systems  Constitutional: Negative for chills and fever.  Psychiatric/Behavioral: Positive for depression. Negative for substance abuse and suicidal ideas. The patient is nervous/anxious.    Physical Exam:  Vitals:   03/27/20 1524  BP: (!) 149/75  Pulse: 92  Temp: 98.3 F (36.8 C)  TempSrc: Oral  SpO2: 99%  Weight: 251 lb 6.4 oz (114 kg)  Height: 5\' 6"  (1.676 m)    Physical Exam Vitals and nursing note reviewed.  Constitutional:      General: He is not in acute distress.    Appearance: He is obese.  HENT:     Head: Normocephalic and atraumatic.  Pulmonary:     Effort: Pulmonary effort is normal. No respiratory distress.  Skin:    General: Skin is warm and dry.  Neurological:     Mental Status: He is alert and oriented to person, place, and time.  Psychiatric:        Mood and Affect: Mood is anxious.        Speech: Speech normal.        Behavior: Behavior normal. Behavior is not agitated.    Assessment & Plan:   See Encounters Tab for problem based charting.  Patient discussed with Dr. Heber [place]

## 2020-03-27 NOTE — Patient Instructions (Signed)
It was nice seeing you today! Thank you for choosing [facility] for your Primary Care.    Today we talked about:   1. Anxiety:  a. Today, we started a new medication called Zoloft. For the first 1 week, take only half a tablet. Then transition to a full tablet daily.  b. Referral placed for Psychiatry  c. Letter written for work for 8 week restriction.  d. Follow up in 4 weeks

## 2020-03-27 NOTE — Assessment & Plan Note (Signed)
-  Requesting refill on NovoLog today given that he is run out completely. -Refill provided

## 2020-03-27 NOTE — Assessment & Plan Note (Signed)
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  [name] states he has had a long history of anxiety that has progressively gotten worse over the past couple years.  At this time, he notes generalized anxiety throughout the day, but that he gets particularly worse when he is pulled from his normal classroom of 5 students to a classroom with 60 students in a setting that is very hectic and unorganized.  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  When when his anxiety provokes suddenly, he feels a sense of impending doom, palpitations, dizziness, shortness of breath, feels his blood pressure gets high.  He notes that this occasionally happens in other conditions, i.e. when he is driving.  He denies ever been seen by a psychiatrist for anxiety before.  Assessment/plan: Elevated GAD-7 today of 16 that is increased from last year's level of 4 that is consistent with patient's history worsening anxiety.  Given that he feels generally anxious throughout the day, I feel he most likely has GAD.  However with his sudden onset anxiety attacks consistent with a panic attack, I wonder if he also has an overlying panic disorder as well.  Given that he is requiring work restrictions, I feel he would benefit most from being evaluated by psychiatry for full diagnostics.  Given that referral will take some time, will start therapy with SSRI.  Patient notes he previously did not tolerate duloxetine.  -Referral to [facility] for psychiatry referral -Start Zoloft 25 mg for 1 week then transition to 50 mg daily -Work restrictions provided for the next 8 weeks until patient can be evaluated by psychiatry and started on appropriate treatment.  After the 8 weeks, his need for restrictions will need to be reevaluated -4-week follow-up

## 2020-03-28 NOTE — Progress Notes (Signed)
Internal Medicine Clinic Attending  Case discussed with Dr. Basaraba  At the time of the visit.  We reviewed the resident's history and exam and pertinent patient test results.  I agree with the assessment, diagnosis, and plan of care documented in the resident's note.  

## 2020-03-29 ENCOUNTER — Ambulatory Visit: Payer: Self-pay | Admitting: Behavioral Health

## 2020-03-29 ENCOUNTER — Other Ambulatory Visit: Payer: Self-pay

## 2020-03-29 DIAGNOSIS — F411 Generalized anxiety disorder: Secondary | ICD-10-CM

## 2020-03-30 ENCOUNTER — Encounter: Payer: Self-pay | Admitting: *Deleted

## 2020-03-30 ENCOUNTER — Other Ambulatory Visit: Payer: Self-pay | Admitting: Internal Medicine

## 2020-03-30 DIAGNOSIS — E0800 Diabetes mellitus due to underlying condition with hyperosmolarity without nonketotic hyperglycemic-hyperosmolar coma (NKHHC): Secondary | ICD-10-CM

## 2020-03-30 MED ORDER — NOVOLOG FLEXPEN 100 UNIT/ML ~~LOC~~ SOPN: 12.0000 [IU] | PEN_INJECTOR | Freq: Three times a day (TID) | SUBCUTANEOUS | 2 refills | Status: DC

## 2020-03-30 MED FILL — NOVOLOG FLEXPEN SYRINGE: 100 | 25 days supply | Qty: 9 | Fill #0

## 2020-04-03 ENCOUNTER — Other Ambulatory Visit: Payer: Self-pay

## 2020-04-03 ENCOUNTER — Other Ambulatory Visit: Payer: Self-pay | Admitting: Student

## 2020-04-03 ENCOUNTER — Encounter: Payer: Self-pay | Admitting: Student

## 2020-04-03 ENCOUNTER — Ambulatory Visit (INDEPENDENT_AMBULATORY_CARE_PROVIDER_SITE_OTHER): Payer: Self-pay | Admitting: Student

## 2020-04-03 VITALS — BP 157/74 | HR 81 | Temp 98.1°F | Ht 66.0 in | Wt 250.0 lb

## 2020-04-03 DIAGNOSIS — L720 Epidermal cyst: Secondary | ICD-10-CM

## 2020-04-03 MED ORDER — DOXYCYCLINE HYCLATE 100 MG PO CAPS: 100.0000 mg | ORAL_CAPSULE | Freq: Two times a day (BID) | ORAL | 0 refills | Status: DC

## 2020-04-03 MED FILL — DOXYCYCLINE HYCLATE 100 MG: 100 | 7 days supply | Qty: 14 | Fill #0

## 2020-04-03 NOTE — Assessment & Plan Note (Addendum)
Assessment: Patient with posterior neck mass that is 6cmx4cm firm and tender to palpate. No erythema or warmth present. The mass has been present for the  past 2 weeks, but appears to be acutely growing in size and tenderness over the past week. The patient endorsed history of spider's in his home and spider bites in the past. The patient initially thought that was the case, but the area grew quite large over the past week. He denied fever, chills, nausea, vomiting, diarrhea, or active drainage from the site. He denies witnessing an animal biting him or feeling as though he was bitten. He notes anterior mass that is small, soft, and non tender and has been present for the past few months  Both masses appear to have dark central punctate point. Similar to those of epidermal inclusion cyst. Posterior cyst confirmed on ultrasound. Due to rapid growth and worsening tenderness, the decision to perform an incision and drainage of the posterior cyst. The patient was included in this decision making process, risks and benefits discussed with the patient. Consent reviewed with patient and signed by him as well as myself. The patient was also informed that I&D would not eliminate the cyst and that in order to get rid of them completely, full excision would need to be performed by general surgery or dermatology. Please see Progress note for I/D procedure. No material was expressed aside from 1-2 cc's of blood. The area was cleaned and gauze was taped to the area. Patient given 3-4 packs of gauze as well as adhesive tape. Decision also made to prescribed course of doxycycline in context of rapid growth, increasing tenderness,, inability to drain the cyst and suspicion that area may be infected. Denied systemic symptoms of fever, chills.   Patient does not currently have private insurance and will be unable to meet with dermatology until approved. Patient currently working with staff to qualify for orange  card.  Plan: Doxycyline 100 mg BID for 7 days ordered Referral placed to dermatology   Addendum:   Patient was visiting Dr. Theodis Shove and asked me to look at inclusion cyst on back of neck. Area was actively draining with white/red fluid, consistent with pus. Will have patient scheduled to see me next week to evaluate whether or not area needs to be opened up. Will prescribe another course of antibiotics.

## 2020-04-03 NOTE — Progress Notes (Signed)
CC: Growth on neck  HPI:  Thomas Gardner is a 64 y.o. male with a past medical history stated below and presents today for growth on neck. Please see problem based assessment and plan for additional details.  Past Medical History:  Diagnosis Date  . Arthritis   . Asthma   . CHF (congestive heart failure) ([place])   . Diabetes mellitus 2003  . GERD (gastroesophageal reflux disease)   . Hyperlipidemia   . Hypertension   . Morbid obesity ([place])   . Sleep apnea     Current Outpatient Medications on File Prior to Visit  Medication Sig Dispense Refill  . albuterol (PROVENTIL HFA;VENTOLIN HFA) 108 (90 Base) MCG/ACT inhaler Inhale 2 puffs into the lungs every 6 (six) hours as needed for wheezing or shortness of breath. MAP pharmacy 3 Inhaler 1  . carvedilol (COREG) 25 MG tablet Take 1 tablet (25 mg total) by mouth 2 (two) times daily with a meal. 180 tablet 1  . chlorthalidone (HYGROTON) 25 MG tablet Take 1 tablet (25 mg total) by mouth daily. 90 tablet 1  . insulin aspart (NOVOLOG FLEXPEN) 100 UNIT/ML FlexPen Inject 12 Units into the skin 3 (three) times daily with meals. 15 mL 2  . Insulin Degludec-Liraglutide (XULTOPHY) 100-3.6 UNIT-MG/ML SOPN Inject 35 Units into the skin daily. 15 mL 3  . insulin glargine (LANTUS SOLOSTAR) 100 UNIT/ML Solostar Pen Inject 20 Units into the skin 2 (two) times daily. IM Program 12 mL 2  . Insulin Pen Needle (UNIFINE PENTIPS) 31G X 5 MM MISC Place at tip of insulin pen and use as directed. 100 each 1  . liraglutide (VICTOZA) 18 MG/3ML SOPN Inject 1.8 mg into the skin daily. IM program 9 mL 2  . losartan (COZAAR) 50 MG tablet Take 2 tablets (100 mg total) by mouth daily. 90 tablet 1  . PROVENTIL HFA 108 (90 Base) MCG/ACT inhaler INHALE 2 PUFFS INTO THE LUNGS EVERY 6 HOURS AS NEEDED FOR WHEEZING ORSHORTNESS OF BREATH. 6.7 g 12  . rosuvastatin (CRESTOR) 20 MG tablet Take 1 tablet (20 mg total) by mouth daily. IM program, pick up 30 tablet 5  . sertraline  (ZOLOFT) 50 MG tablet Take 1 tablet (50 mg total) by mouth daily. 30 tablet 2  . ZETIA 10 MG tablet TAKE 1 TABLET (10MG  TOTAL) BY MOUTH DAILY. 90 tablet 2   No current facility-administered medications on file prior to visit.    Family History  Problem Relation Age of Onset  . Cancer Mother        ovarian  . Diabetes Mother   . Stroke Father   . Cancer Maternal Aunt        breast  . Diabetes Maternal Aunt   . Cancer Maternal Uncle        colon    Social History   Socioeconomic History  . Marital status: Single    Spouse name: Not on file  . Number of children: Not on file  . Years of education: Not on file  . Highest education level: Not on file  Occupational History  . Occupation: Beautician  Tobacco Use  . Smoking status: Never Smoker  . Smokeless tobacco: Never Used  Substance and Sexual Activity  . Alcohol use: No    Alcohol/week: 0.0 standard drinks  . Drug use: No  . Sexual activity: Not on file  Other Topics Concern  . Not on file  Social History Narrative  . Not on file   Social  Determinants of Health   Financial Resource Strain:   . Difficulty of Paying Living Expenses: Not on file  Food Insecurity:   . Worried About Charity fundraiser in the Last Year: Not on file  . Ran Out of Food in the Last Year: Not on file  Transportation Needs:   . Lack of Transportation (Medical): Not on file  . Lack of Transportation (Non-Medical): Not on file  Physical Activity:   . Days of Exercise per Week: Not on file  . Minutes of Exercise per Session: Not on file  Stress:   . Feeling of Stress : Not on file  Social Connections:   . Frequency of Communication with Friends and Family: Not on file  . Frequency of Social Gatherings with Friends and Family: Not on file  . Attends Religious Services: Not on file  . Active Member of Clubs or Organizations: Not on file  . Attends Archivist Meetings: Not on file  . Marital Status: Not on file  Intimate  Partner Violence:   . Fear of Current or Ex-Partner: Not on file  . Emotionally Abused: Not on file  . Physically Abused: Not on file  . Sexually Abused: Not on file    Review of Systems: ROS negative except for what is noted on the assessment and plan.  Vitals:   04/03/20 1421  BP: (!) 152/69  Pulse: 82  Temp: 98.1 F (36.7 C)  TempSrc: Oral  SpO2: 100%  Weight: 250 lb (113.4 kg)  Height: 5\' 6"  (1.676 m)     Physical Exam: Physical Exam Vitals and nursing note reviewed.  Constitutional:      General: He is not in acute distress.    Appearance: Normal appearance. He is not ill-appearing or toxic-appearing.  HENT:     Head: Normocephalic and atraumatic.  Neck:     Comments: Lesion on posterior neck (approxiatmely c5/c6 area) 6cmx4cm in dimension. Small central punctum present. Upper portion more firm than bottom, below punctum. Area is firm, but moveable. Non-fluctuant. Tender to palpate and raised. No erythema present. No warmth appreciated.   Anterior neck lesion appears to be 1.5 cm in diameter that is soft palpable with small central punctum.  Cardiovascular:     Rate and Rhythm: Normal rate and regular rhythm.  Pulmonary:     Effort: Pulmonary effort is normal. No respiratory distress.  Skin:    General: Skin is warm and dry.  Neurological:     General: No focal deficit present.     Mental Status: He is alert and oriented to person, place, and time. Mental status is at baseline.  Psychiatric:        Mood and Affect: Mood normal.        Behavior: Behavior normal.    Imaging: Bedside ultrasound performed. Posterior neck mass appeared to have well defined border with a round hypoechoic structure  Incision and Drainage Procedure Note  Pre-operative Diagnosis: Posterior Neck Epidermal Inclusion Cyst  Post-operative Diagnosis: Same  Indications: Infected Epidermal Inclusion Cyst  Anesthesia: 1% plain lidocaine  Procedure Details  The procedure, risks and  complications have been discussed in detail (including, but not limited to airway compromise, infection, bleeding) with the patient, and the patient has signed consent to the procedure.  The skin was sterilely prepped and draped over the affected area in the usual fashion. After adequate local anesthesia, I&D with a #11 blade was performed on the upper portion of cyst. Purulent drainage: absent The patient  was observed until stable.  Findings:  horizontal .5 cm incision made in upper portion, about central punctate point. Only blood able to be expressed despite incision 3-4 mm deep and horizontal compression as well as use of curved hemostat in attempt to break capsule.  EBL: 1-2 cc's  Drains: None  Condition: Tolerated procedure well and Stable  Complications: none.   Assessment & Plan:   See Encounters Tab for problem based charting.  Patient seen with Dr. Donnita Falls, D.O. [facility], PGY-1 Pager: [phone], Phone: [phone] Date 04/03/2020 Time 2:26 PM

## 2020-04-03 NOTE — Patient Instructions (Addendum)
Thank you, [name] for allowing [place] to provide your care today. Today we discussed the cyst on your neck. It appears to be an infected epidermal inclusion cyst. We attempted to drain it today, but was unfortunately unable to express any material. We will be writing you for an antibiotic and referring you to dermatology.     I have ordered the following labs for you:  Lab Orders  No laboratory test(s) ordered today     Referrals ordered today:   Referral Orders  No referral(s) requested today     I have ordered the following medication/changed the following medications:   Stop the following medications: There are no discontinued medications.   Start the following medications: No orders of the defined types were placed in this encounter.    Follow up: 4 months    Remember: If you have any fevers, chills, or signs of infection, please call the office!  Should you have any questions or concerns please call the internal medicine clinic at [phone].     Sanjuana Letters, D.O. [facility]   Incision and Drainage, Care After This sheet gives you information about how to care for yourself after your procedure. Your health care provider may also give you more specific instructions. If you have problems or questions, contact your health care provider. What can I expect after the procedure? After the procedure, it is common to have:  Pain or discomfort around the incision site.  Blood, fluid, or pus (drainage) from the incision.  Redness and firm skin around the incision site. Follow these instructions at home: Medicines  Take over-the-counter and prescription medicines only as told by your health care provider.  If you were prescribed an antibiotic medicine, use or take it as told by your health care provider. Do not stop using the antibiotic even if you start to feel better. Wound care Follow instructions from your health care  provider about how to take care of your wound. Make sure you:  Wash your hands with soap and water before and after you change your bandage (dressing). If soap and water are not available, use hand sanitizer.  Change your dressing and packing as told by your health care provider. ? If your dressing is dry or stuck when you try to remove it, moisten or wet the dressing with saline or water so that it can be removed without harming your skin or tissues. ? If your wound is packed, leave it in place until your health care provider tells you to remove it. To remove the packing, moisten or wet the packing with saline or water so that it can be removed without harming your skin or tissues.  Leave stitches (sutures), skin glue, or adhesive strips in place. These skin closures may need to stay in place for 2 weeks or longer. If adhesive strip edges start to loosen and curl up, you may trim the loose edges. Do not remove adhesive strips completely unless your health care provider tells you to do that. Check your wound every day for signs of infection. Check for:  More redness, swelling, or pain.  More fluid or blood.  Warmth.  Pus or a bad smell. If you were sent home with a drain tube in place, follow instructions from your health care provider about:  How to empty it.  How to care for it at home.  General instructions  Rest the affected area.  Do not take baths, swim, or use a  hot tub until your health care provider approves. Ask your health care provider if you may take showers. You may only be allowed to take sponge baths.  Return to your normal activities as told by your health care provider. Ask your health care provider what activities are safe for you. Your health care provider may put you on activity or lifting restrictions.  The incision will continue to drain. It is normal to have some clear or slightly bloody drainage. The amount of drainage should lessen each day.  Do not apply  any creams, ointments, or liquids unless you have been told to by your health care provider.  Keep all follow-up visits as told by your health care provider. This is important. Contact a health care provider if:  Your cyst or abscess returns.  You have a fever or chills.  You have more redness, swelling, or pain around your incision.  You have more fluid or blood coming from your incision.  Your incision feels warm to the touch.  You have pus or a bad smell coming from your incision.  You have red streaks above or below the incision site. Get help right away if:  You have severe pain or bleeding.  You cannot eat or drink without vomiting.  You have decreased urine output.  You become short of breath.  You have chest pain.  You cough up blood.  The affected area becomes numb or starts to tingle. These symptoms may represent a serious problem that is an emergency. Do not wait to see if the symptoms will go away. Get medical help right away. Call your local emergency services (911 in the [place] Do not drive yourself to the hospital. Summary  After this procedure, it is common to have fluid, blood, or pus coming from the surgery site.  Follow all home care instructions. You will be told how to take care of your incision, how to check for infection, and how to take medicines.  If you were prescribed an antibiotic medicine, take it as told by your health care provider. Do not stop taking the antibiotic even if you start to feel better.  Contact a health care provider if you have increased redness, swelling, or pain around your incision. Get help right away if you have chest pain, you vomit, you cough up blood, or you have shortness of breath.  Keep all follow-up visits as told by your health care provider. This is important. This information is not intended to replace advice given to you by your health care provider. Make sure you discuss any questions you have with your health  care provider. Document Revised: 04/06/2018 Document Reviewed: 04/06/2018 Elsevier Patient Education  2020 [employer].

## 2020-04-04 NOTE — Progress Notes (Signed)
Internal Medicine Clinic Attending  I saw and evaluated the patient.  I personally confirmed the key portions of the history and exam documented by Dr. Johnney Ou and I reviewed pertinent patient test results.  The assessment, diagnosis, and plan were formulated together and I agree with the documentation in the resident's note.   I was present for and assisted with the procedure. Attempted I&D of infected inclusion cyst on patient's posterior next was unfortunately unsuccessful despite ultrasound guidance. The skin was thickened and inflamed and we were unable to express any material.  Agree with short course of antibiotics and referral to dermatology for excision.

## 2020-04-05 NOTE — Progress Notes (Unsigned)
Thomas Gardner. Thomas Gardner MRN: [id number]  Integrated Behavioral Health Visit: In-Person  Start: 10:00am End: 10:50am Total time: 50 min  Number of IBH visits: 1/6  Discussed Confidentiality & ICDocument Discussed limitations of Confidentiality Pt expressed understanding of process & expressed consent  Presenting concerns:  Pt has exp'd the death of 4 individuals that are family or close friends in a short period of time. This has him feeling, "rattled" & wondering about his stress levels & how they contribute.  Pt strengths/protective factors: Pt is a highly intuitive individual who is compassionate to the needs of others; sometimes at his own expense. 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  Goals: 1-Pt will set inc'd boundaries at work & in relationships 2-Pt will become familiar w/the work of Dr. Willette Brace, MD re: [employer] Pippin & the skills they need to survive 3-Pt will cont to monitor his health & comply w/the Physician's instructions for his DM & various health issues  Progress towards goals: Initiated today  Interventions: Intake/Assessment Process, SFBT  Pt response: Pt is receptive & willing to act on suggestions of Clinician\  Assessment:  Pt is currently exp'g broad range of feelings due to recent deaths in his family & neighborhood Pt is currently overwhelmed by his work situation at both jobs  Pt may benefit from resources offered, coping skills suggested for grieving, & setting tighter boundaries around his giving nature  Plan: F/u w [place] in one week Read the suggested materials Consider the apps for relaxation: TrickMinds.uy & headspace.com, meditation twice daily X 10 min  Discussed this plan w/the Pt & he agrees to f/through. We spoke & offered further info as questions came up for Pt. Pt acknowledges & understands the importance of making progress one step at a time.   Dr. Jordan Hawks L. Theodis Shove, PhD, LMFT

## 2020-04-07 MED FILL — VICTOZA 2-PAK 18 MG/3 ML PE: 18 | 30 days supply | Qty: 6 | Fill #2

## 2020-04-07 MED FILL — LANTUS SOLOSTAR 100 UNITS/M: 100 | 30 days supply | Qty: 12 | Fill #2

## 2020-04-10 ENCOUNTER — Encounter: Payer: Self-pay | Admitting: Internal Medicine

## 2020-04-12 ENCOUNTER — Other Ambulatory Visit: Payer: Self-pay | Admitting: Student

## 2020-04-12 ENCOUNTER — Ambulatory Visit: Payer: Self-pay | Admitting: Behavioral Health

## 2020-04-12 DIAGNOSIS — F411 Generalized anxiety disorder: Secondary | ICD-10-CM

## 2020-04-12 MED ORDER — DOXYCYCLINE HYCLATE 100 MG PO CAPS: 100.0000 mg | ORAL_CAPSULE | Freq: Two times a day (BID) | ORAL | 0 refills | Status: DC

## 2020-04-12 MED FILL — DOXYCYCLINE HYCLATE 100 MG: 100 | 7 days supply | Qty: 14 | Fill #0

## 2020-04-12 NOTE — Addendum Note (Signed)
Addended by: Riesa Pope on: 04/12/2020 12:23 PM   Modules accepted: Orders

## 2020-04-18 ENCOUNTER — Ambulatory Visit (INDEPENDENT_AMBULATORY_CARE_PROVIDER_SITE_OTHER): Payer: Self-pay | Admitting: Student

## 2020-04-18 ENCOUNTER — Encounter: Payer: Self-pay | Admitting: Student

## 2020-04-18 DIAGNOSIS — L720 Epidermal cyst: Secondary | ICD-10-CM

## 2020-04-18 NOTE — BH Specialist Note (Signed)
Integrated Behavioral Health Follow Up In-Person Visit  MRN: [id number] Name: Thomas Gardner  Number of [facility] Clinician visits: 1/6 Session Start time: 10:00am  Session End time: 10:45am Total time: 45  minutes  Types of Service: Individual psychotherapy and General Behavioral Integrated Care (BHI)  Interpretor:No. Interpretor Name and Language: n/a  Subjective: Thomas Gardner is a 64 y.o. male accompanied by Self Patient was referred by Dessie Coma, LCSW for continued car& wellness. Patient reports the following symptoms/concerns: anxious/nervous, work stressors. 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 Night sessions are more stressful due to Student attitudes & beh Self-Care: Pt is grieving the recent deaths of several close friends. He has put a stop to attending funerals. Life Changes: Significant impact of half dozen friends & neighbors in the last month  Patient and/or Family's Strengths/Protective Factors: Social and Emotional competence and grief over 5 recent deaths in his immediate circle  Goals Addressed: Patient will: 1.  Reduce symptoms of: anxiety, depression and stress  2.  Increase knowledge and/or ability of: coping skills, healthy habits and stress reduction  3.  Demonstrate ability to: Begin healthy grieving over loss  Progress towards Goals: Ongoing  Interventions: Interventions utilized:  Solution-Focused Strategies and [facility] approach Standardized Assessments completed: Not Needed  Patient and/or Family Response: Positive response to grief support; Clinician cont'd to validate & normalize the natural process of grief for the uniqueness of Individual  Patient  Centered Plan: Patient is on the following Treatment Plan(s): Address Pt grief as he processes the loss of 5 close friends/relatives. 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   Patient may benefit from inc'd self-care practices, self-awareness, & psychoedu re: process of grief & having the tendency toward empathic responding.  Plan: 1. Follow up with behavioral health clinician on : the next 1-2 wks 2. Behavioral recommendations: inc self-care practices & practice improved boundary setting for mental wellness. 3. Referral(s): [place] (In Clinic) 4. "From scale of 1-10, how likely are you to follow plan?": [place], LMFT

## 2020-04-18 NOTE — Progress Notes (Signed)
CC: Epidermal Inclusion Cyst  HPI:  Thomas Gardner is a 64 y.o. male with a past medical history stated below and presents today for epidermal inclusion cyst. Please see problem based assessment and plan for additional details.  Past Medical History:  Diagnosis Date  . Arthritis   . Asthma   . CHF (congestive heart failure) ([place])   . Diabetes mellitus 2003  . GERD (gastroesophageal reflux disease)   . Hyperlipidemia   . Hypertension   . Morbid obesity ([place])   . Sleep apnea     Current Outpatient Medications on File Prior to Visit  Medication Sig Dispense Refill  . albuterol (PROVENTIL HFA;VENTOLIN HFA) 108 (90 Base) MCG/ACT inhaler Inhale 2 puffs into the lungs every 6 (six) hours as needed for wheezing or shortness of breath. [facility] pharmacy 3 Inhaler 1  . carvedilol (COREG) 25 MG tablet Take 1 tablet (25 mg total) by mouth 2 (two) times daily with a meal. 180 tablet 1  . chlorthalidone (HYGROTON) 25 MG tablet Take 1 tablet (25 mg total) by mouth daily. 90 tablet 1  . doxycycline (VIBRAMYCIN) 100 MG capsule Take 1 capsule (100 mg total) by mouth 2 (two) times daily for 7 days. 14 capsule 0  . insulin aspart (NOVOLOG FLEXPEN) 100 UNIT/ML FlexPen Inject 12 Units into the skin 3 (three) times daily with meals. 15 mL 2  . Insulin Degludec-Liraglutide (XULTOPHY) 100-3.6 UNIT-MG/ML SOPN Inject 35 Units into the skin daily. 15 mL 3  . insulin glargine (LANTUS SOLOSTAR) 100 UNIT/ML Solostar Pen Inject 20 Units into the skin 2 (two) times daily. IM Program 12 mL 2  . Insulin Pen Needle (UNIFINE PENTIPS) 31G X 5 MM MISC Place at tip of insulin pen and use as directed. 100 each 1  . liraglutide (VICTOZA) 18 MG/3ML SOPN Inject 1.8 mg into the skin daily. IM program 9 mL 2  . losartan (COZAAR) 50 MG tablet Take 2 tablets (100 mg total) by mouth daily. 90 tablet 1  . PROVENTIL HFA 108 (90 Base) MCG/ACT inhaler INHALE 2 PUFFS INTO THE LUNGS EVERY 6 HOURS AS NEEDED FOR WHEEZING ORSHORTNESS OF  BREATH. 6.7 g 12  . rosuvastatin (CRESTOR) 20 MG tablet Take 1 tablet (20 mg total) by mouth daily. IM program, pick up 30 tablet 5  . sertraline (ZOLOFT) 50 MG tablet Take 1 tablet (50 mg total) by mouth daily. 30 tablet 2  . ZETIA 10 MG tablet TAKE 1 TABLET (10MG  TOTAL) BY MOUTH DAILY. 90 tablet 2   No current facility-administered medications on file prior to visit.    Family History  Problem Relation Age of Onset  . Cancer Mother        ovarian  . Diabetes Mother   . Stroke Father   . Cancer Maternal Aunt        breast  . Diabetes Maternal Aunt   . Cancer Maternal Uncle        colon    Social History   Socioeconomic History  . Marital status: Single    Spouse name: Not on file  . Number of children: Not on file  . Years of education: Not on file  . Highest education level: Not on file  Occupational History  . Occupation: Beautician  Tobacco Use  . Smoking status: Never Smoker  . Smokeless tobacco: Never Used  Substance and Sexual Activity  . Alcohol use: No    Alcohol/week: 0.0 standard drinks  . Drug use: No  . Sexual  activity: Not on file  Other Topics Concern  . Not on file  Social History Narrative  . Not on file   Social Determinants of Health   Financial Resource Strain:   . Difficulty of Paying Living Expenses: Not on file  Food Insecurity:   . Worried About Charity fundraiser in the Last Year: Not on file  . Ran Out of Food in the Last Year: Not on file  Transportation Needs:   . Lack of Transportation (Medical): Not on file  . Lack of Transportation (Non-Medical): Not on file  Physical Activity:   . Days of Exercise per Week: Not on file  . Minutes of Exercise per Session: Not on file  Stress:   . Feeling of Stress : Not on file  Social Connections:   . Frequency of Communication with Friends and Family: Not on file  . Frequency of Social Gatherings with Friends and Family: Not on file  . Attends Religious Services: Not on file  . Active  Member of Clubs or Organizations: Not on file  . Attends Archivist Meetings: Not on file  . Marital Status: Not on file  Intimate Partner Violence:   . Fear of Current or Ex-Partner: Not on file  . Emotionally Abused: Not on file  . Physically Abused: Not on file  . Sexually Abused: Not on file    Review of Systems: ROS negative except for what is noted on the assessment and plan.  Vitals:   04/18/20 1524  BP: (!) 163/86  Pulse: 86  Temp: 98.3 F (36.8 C)  TempSrc: Oral  SpO2: 98%  Weight: 249 lb 4.8 oz (113.1 kg)     Physical Exam: Physical Exam Vitals and nursing note reviewed.  Constitutional:      General: He is not in acute distress.    Appearance: Normal appearance. He is not ill-appearing, toxic-appearing or diaphoretic.  HENT:     Head: Normocephalic and atraumatic.  Neck:     Comments: Posterior neck, epidermal inclusion cyst appears to have improved over the past week. Decrease in size. Area is less firm. Area is actively draining serous/purulent drainage with light palpation. Tenderness present with deep palpation. No erythema present.   Multiple heads seem to have developed and are draining.  Cardiovascular:     Rate and Rhythm: Normal rate.  Pulmonary:     Effort: No respiratory distress.  Neurological:     Mental Status: He is alert and oriented to person, place, and time. Mental status is at baseline.  Psychiatric:        Mood and Affect: Mood normal.        Behavior: Behavior normal.      Assessment & Plan:   See Encounters Tab for problem based charting.  Patient seen with Dr. Delano Metz, D.O. [facility], PGY-1 Pager: [phone], Phone: [phone] Date 04/18/2020 Time 4:31 PM

## 2020-04-18 NOTE — Assessment & Plan Note (Signed)
Assessment: Epidermal inclusion cyst of posterior neck has improved significantly and is actively draining. He has been applying warm compresses daily which have helped with the pain as well as increased amount of drainage. Do not believe area is actively infected at this time, patient will finish 7 day course of doxycyline. Because area is draining well, do not believe additional I/D needs to be performed at this time. Patient given return precautions, if area stops draining or becomes enlarged, more tender, erythematous, or he develops systemic symptoms, he is to call the clinic and scheduled to be seen. Patient agrees with plan and return precautions.   April was soonest patient can be seen by dermatology.   Plan: - finish course of doxycyline - continue warm compresses - return precautions given - referral appointment to dermatology placed, appointment scheduled for April

## 2020-04-18 NOTE — Patient Instructions (Signed)
Thank you, [name] for allowing [place] to provide your care today. Today we discussed your epidermal inclusion cyst. It seems to  Be draining well on it's own. Please continue warm compresses and allow the area to drain. If you notice it becomes more tender to touch or becomes "red and angry." If you have any fevers or chills, please call our office as the area may need to be drained further.      I have ordered the following labs for you:  Lab Orders  No laboratory test(s) ordered today     Referrals ordered today:   Referral Orders  No referral(s) requested today     I have ordered the following medication/changed the following medications:   Stop the following medications: There are no discontinued medications.   Start the following medications: No orders of the defined types were placed in this encounter.    Follow up: As needed     Should you have any questions or concerns please call the internal medicine clinic at [phone].     Sanjuana Letters, D.O. [place]

## 2020-04-19 MED FILL — CARVEDILOL 25 MG TABLET: 25 | 30 days supply | Qty: 60 | Fill #1

## 2020-04-19 MED FILL — LOSARTAN POTASSIUM 50 MG TA: 50 | 30 days supply | Qty: 60 | Fill #2

## 2020-04-19 MED FILL — CHLORTHALIDONE 25 MG TABS: 25 | 30 days supply | Qty: 30 | Fill #3

## 2020-04-21 NOTE — Progress Notes (Signed)
Internal Medicine Clinic Attending  I saw and evaluated the patient.  I personally confirmed the key portions of the history and exam documented by Dr. Katsadouros and I reviewed pertinent patient test results.  The assessment, diagnosis, and plan were formulated together and I agree with the documentation in the resident's note.  

## 2020-04-26 ENCOUNTER — Other Ambulatory Visit: Payer: Self-pay | Admitting: Internal Medicine

## 2020-04-26 ENCOUNTER — Ambulatory Visit: Payer: Self-pay | Admitting: Behavioral Health

## 2020-04-26 ENCOUNTER — Ambulatory Visit: Payer: Self-pay | Admitting: Internal Medicine

## 2020-04-26 ENCOUNTER — Encounter: Payer: Self-pay | Admitting: Internal Medicine

## 2020-04-26 VITALS — BP 138/69 | HR 91 | Temp 98.2°F | Ht 66.0 in | Wt 250.8 lb

## 2020-04-26 DIAGNOSIS — I509 Heart failure, unspecified: Secondary | ICD-10-CM

## 2020-04-26 DIAGNOSIS — E0800 Diabetes mellitus due to underlying condition with hyperosmolarity without nonketotic hyperglycemic-hyperosmolar coma (NKHHC): Secondary | ICD-10-CM

## 2020-04-26 DIAGNOSIS — Z794 Long term (current) use of insulin: Secondary | ICD-10-CM

## 2020-04-26 DIAGNOSIS — E785 Hyperlipidemia, unspecified: Secondary | ICD-10-CM

## 2020-04-26 DIAGNOSIS — F411 Generalized anxiety disorder: Secondary | ICD-10-CM

## 2020-04-26 DIAGNOSIS — E11628 Type 2 diabetes mellitus with other skin complications: Secondary | ICD-10-CM

## 2020-04-26 DIAGNOSIS — R5383 Other fatigue: Secondary | ICD-10-CM

## 2020-04-26 DIAGNOSIS — R600 Localized edema: Secondary | ICD-10-CM

## 2020-04-26 LAB — POCT GLYCOSYLATED HEMOGLOBIN (HGB A1C): Hemoglobin A1C: 11.7 % — AB (ref 4.0–5.6)

## 2020-04-26 LAB — GLUCOSE, CAPILLARY: Glucose-Capillary: 376 mg/dL — ABNORMAL HIGH (ref 70–99)

## 2020-04-26 MED ORDER — LANTUS SOLOSTAR 100 UNIT/ML ~~LOC~~ SOPN: 20.0000 [IU] | PEN_INJECTOR | Freq: Two times a day (BID) | SUBCUTANEOUS | 2 refills | Status: DC

## 2020-04-26 MED ORDER — FREESTYLE SYSTEM KIT: 1.0000 | PACK | 0 refills | Status: DC | PRN

## 2020-04-26 MED ORDER — METFORMIN HCL ER 500 MG PO TB24: 500.0000 mg | ORAL_TABLET | Freq: Every day | ORAL | 3 refills | Status: DC

## 2020-04-26 MED FILL — METFORMIN HCL ER 500 MG TB2: 500 | 30 days supply | Qty: 30 | Fill #0

## 2020-04-26 MED FILL — LANTUS SOLOSTAR 100 UNITS/M: 100 | 30 days supply | Qty: 12 | Fill #0

## 2020-04-26 NOTE — Assessment & Plan Note (Signed)
Fatigue: Thomas Gardner reports that for the past 4 to 5 months he has been feeling fatigued and more sleepy.  He denies any excessive weight gain or weight loss.  On chart review, it appears that he did have prior positive FOBT however this was never followed up with a colonoscopy due to financial challenges.  He denies any family history of colon cancer.  He denies fevers, chills, weight loss, night sweats.  He states that about 2 weeks ago he actually noticed trace of blood in the toilet bowl but this has ceased and has not occurred since.  He has only gained about 6 pounds since August 2021.  In the past, he actually did follow-up with GI however states that he was supposed to pay out-of-pocket and he could not afford it so no procedure was performed  Assessment and plan: Given his prior positive FOBT, I would worried about slow GI bleed.  He is currently working with our Development worker, community to apply for Medicare as he is 64 years old.  I will go ahead and order CBC, iron, ferritin, TIBC

## 2020-04-26 NOTE — Assessment & Plan Note (Addendum)
Diabetes mellitus: Uncontrolled.  A1c today is 11.7% from 11% when it was last checked.  He states that his home blood glucose monitoring has ranged from 200s-290s in the morning however sometimes he would get as high up to the 300s.  Unfortunately he did not bring his glucometer today and we were not able to get an accurate measurement of how his glucose readings have been at home.  Plan: -Continue Victoza 1.8mg  daily -Increase Lantus from 20 units to 25 units twice daily -Increase NovoLog from 12 units to 15 units 3 times a day -Add Metformin XL 500 mg daily (reports of prior episodes of hallucination with Metformin but willing to try it again) -Prescription given for new glucometer

## 2020-04-26 NOTE — Progress Notes (Signed)
   CC: Follow-up diabetes mellitus, hypertension  HPI:  Mr.Lesean E Mustin is a 64 y.o. with medical history significant for uncontrolled diabetes mellitus, hypertension, generalized anxiety disorder presenting to follow-up on chronic medical problems.  Please see problem based charting for further details.  Past Medical History:  Diagnosis Date  . Arthritis   . Asthma   . CHF (congestive heart failure) ([facility])   . Diabetes mellitus 2003  . GERD (gastroesophageal reflux disease)   . Hyperlipidemia   . Hypertension   . Morbid obesity ([place])   . Sleep apnea    Review of Systems: As per HPI  Physical Exam:  Vitals:   04/26/20 1009  BP: 138/69  Pulse: 91  Temp: 98.2 F (36.8 C)  TempSrc: Oral  SpO2: 99%  Weight: 250 lb 12.8 oz (113.8 kg)  Height: 5\' 6"  (1.676 m)   Physical Exam Constitutional:      Appearance: Normal appearance. He is obese.  Cardiovascular:     Rate and Rhythm: Normal rate.     Heart sounds: Normal heart sounds.  Pulmonary:     Effort: No respiratory distress.     Breath sounds: Normal breath sounds.  Musculoskeletal:        General: Swelling present.     Right lower leg: Edema (1-2+) present.     Left lower leg: Edema (1-2+) present.  Neurological:     Mental Status: He is alert.  Psychiatric:        Mood and Affect: Mood normal.        Behavior: Behavior normal.     Assessment & Plan:   See Encounters Tab for problem based charting.  Patient discussed with Dr. Evette Doffing

## 2020-04-26 NOTE — Assessment & Plan Note (Signed)
Lower extremity edema: He states that for the past month, his had worsening lower extremity edema that is typically worse at night but improves during the day.  He denies orthopnea and states that he uses 2 pillows to sleep.  He does not report of shortness of breath.  He does have a history of heart failure with reduced ejection fraction however has not followed up with cardiology in a while.  On physical exams, he did not appear to be volume overloaded with exception of 1-2+ lower extremity pitting edema bilaterally.  Cardiopulmonary exam was unremarkable.    Assessment and plan: Bilateral lower extremity edema most likely secondary to chronic venous insufficiency however given his history of nonischemic cardiomyopathy/HFrEF, I would like to pursue echocardiogram however states that he would like to wait until he gets financial assistance.  Will pursue lower extremity stockings

## 2020-04-26 NOTE — Assessment & Plan Note (Signed)
HFrEF: Appears that he has nonischemic cardiomyopathy with his last ejection fraction of 37%.  Which was reviewed on myocardial perfusion imaging in 2013.  He has not been followed up with cardiology.  He denies any signs and symptoms of congestive heart failure presently and on physical exams no evidence of volume overload.  Plan: -Continue Coreg 25 mg twice daily -Continue losartan 50 mg daily -For his heart failure optimization, I would like to repeat echocardiogram and if it shows persistently reduced EF, he would require titration of medication plus transition to [facility] given is efficacy, spironolactone not to exclude cardiology referral.  This is over challenging given the financial barrier

## 2020-04-26 NOTE — BH Specialist Note (Signed)
Integrated Behavioral Health Follow Up In-Person Visit  MRN: [id number] Name: Thomas Gardner  Number of [facility] Clinician visits: 3/6 Session Start time: 11:00am  Session End time: 11:50am Total time: 50  minutes  Types of Service: Individual psychotherapy  Interpretor:No. Interpretor Name and Language: n/a  Subjective: Thomas Gardner is a 64 y.o. male accompanied by Self Patient was referred by Resident for [facility]. 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 He might be alerted to an Interview in Jan. 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 Self-Care: Pt is inc'g his attn to this aspect of his life. He is setting improved boundaries w/everyone & still giving his best efforts. Life Changes: Pt is adjusting to the multiple aspects of being older & having older friends who pass away. 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  Patient and/or Family's Strengths/Protective Factors: Social connections, Social and Emotional competence, Concrete supports in place (healthy food, safe environments, etc.) and Sense of purpose  Goals Addressed: Patient will: 1.  Reduce symptoms of: anxiety and depression  2.  Increase knowledge and/or ability of: healthy habits, self-management skills, stress reduction and inc'ly more time for self & less spent on  others when he is so tired.  3.  Demonstrate ability to: Increase healthy adjustment to current life circumstances and Begin healthy grieving over loss  Progress towards Goals: Ongoing  Interventions: Interventions utilized:  Solution-Focused Strategies, Supportive Counseling and Supportive Reflection Standardized Assessments completed: Not Needed  Patient and/or Family Response: Pt cont's to care for himself & make efforts towards better health outcomes. Pt knows he needs to walk more, but his fatigue mostly prevents this. Pt has recently exp'd a presentation at work that was valuable about grief during the holidays. He recognized his guilt & grief issues more clearly.  Patient Centered Plan: Patient is on the following Treatment Plan(s): Cont to care for health issues, taking time for Psychotherapy to speak things out & get feedback. Assessment: Patient currently experiencing survivor's guilt & guilt over not attending services.   Patient may benefit from cont'd attn to his evolving grief rxn & to how much he can assist others in their time of need when he is having difficulty.  Plan: 1. Follow up with behavioral health clinician on : 2 wks 2. Behavioral recommendations: Move more as directed by Physician, practice self-care & inc awareness of Empaths 3. Referral(s): [place] (In Clinic) 4. "From scale of 1-10, how likely are you to follow plan?": [place], LMFT

## 2020-04-26 NOTE — Patient Instructions (Addendum)
Thomas Gardner,  It was a pleasure taking care of you in the clinic today.  Here my recommendations after our visit.  I am getting blood work to check on your fatigue.  I want to start you on Metformin as this will greatly help with control your diabetes.  When you check out, please make an appointment to see the financial counselor.  Increase Lantus to 25 units twice a day and NovoLog to 15 units 3 times a day.  Thank you! Dr. Eileen Stanford  Please call the internal medicine center clinic if you have any questions or concerns, we may be able to help and keep you from a long and expensive emergency room wait. Our clinic and after hours phone number is [phone], the best time to call is Monday through Friday 9 am to 4 pm but there is always someone available 24/7 if you have an emergency. If you need medication refills please notify your pharmacy one week in advance and they will send [place] a request.   If you have not gotten the COVID vaccine, I recommend doing so:  You may get it at your local [facility] or [facility] OR To schedule an appointment for a COVID vaccine or be added to the vaccine wait list: Go to [email or web address]   OR Go to [email or web address]                  OR Call [phone]                                     OR Call [phone] and select Option 2

## 2020-04-27 ENCOUNTER — Other Ambulatory Visit: Payer: Self-pay | Admitting: Internal Medicine

## 2020-04-27 DIAGNOSIS — E785 Hyperlipidemia, unspecified: Secondary | ICD-10-CM

## 2020-04-27 LAB — IRON AND TIBC
Iron Saturation: 34 % (ref 15–55)
Iron: 73 ug/dL (ref 38–169)
Total Iron Binding Capacity: 214 ug/dL — ABNORMAL LOW (ref 250–450)
UIBC: 141 ug/dL (ref 111–343)

## 2020-04-27 LAB — FERRITIN: Ferritin: 869 ng/mL — ABNORMAL HIGH (ref 30–400)

## 2020-04-27 LAB — LIPID PANEL
Chol/HDL Ratio: 10.3 ratio — ABNORMAL HIGH (ref 0.0–5.0)
Cholesterol, Total: 300 mg/dL — ABNORMAL HIGH (ref 100–199)
HDL: 29 mg/dL — ABNORMAL LOW (ref >39)
LDL Chol Calc (NIH): 220 mg/dL — ABNORMAL HIGH (ref 0–99)
Triglycerides: 249 mg/dL — ABNORMAL HIGH (ref 0–149)
VLDL Cholesterol Cal: 51 mg/dL — ABNORMAL HIGH (ref 5–40)

## 2020-04-27 LAB — BMP8+ANION GAP
Anion Gap: 16 mmol/L (ref 10.0–18.0)
BUN/Creatinine Ratio: 15 (ref 10–24)
BUN: 15 mg/dL (ref 8–27)
CO2: 24 mmol/L (ref 20–29)
Calcium: 9.2 mg/dL (ref 8.6–10.2)
Chloride: 96 mmol/L (ref 96–106)
Creatinine, Ser: 1 mg/dL (ref 0.76–1.27)
GFR calc Af Amer: 92 mL/min/{1.73_m2} (ref >59)
GFR calc non Af Amer: 79 mL/min/{1.73_m2} (ref >59)
Glucose: 406 mg/dL — ABNORMAL HIGH (ref 65–99)
Potassium: 3.5 mmol/L (ref 3.5–5.2)
Sodium: 136 mmol/L (ref 134–144)

## 2020-04-27 LAB — TSH: TSH: 1.1 u[IU]/mL (ref 0.450–4.500)

## 2020-04-27 MED ORDER — ROSUVASTATIN CALCIUM 40 MG PO TABS: 40.0000 mg | ORAL_TABLET | Freq: Every day | ORAL | 3 refills | Status: DC

## 2020-04-27 MED FILL — ROSUVASTATIN CALCIUM 40 MG: 40 | 30 days supply | Qty: 30 | Fill #0

## 2020-04-27 NOTE — Addendum Note (Signed)
Addended by: Jean Rosenthal on: 04/27/2020 08:56 AM   Modules accepted: Orders

## 2020-04-27 NOTE — Progress Notes (Signed)
Internal Medicine Clinic Attending  Case discussed with Dr. Agyei  At the time of the visit.  We reviewed the resident's history and exam and pertinent patient test results.  I agree with the assessment, diagnosis, and plan of care documented in the resident's note.  

## 2020-04-27 NOTE — Addendum Note (Signed)
Addended by: Hulan Fray on: 04/27/2020 08:54 AM   Modules accepted: Orders

## 2020-05-01 ENCOUNTER — Encounter: Payer: Self-pay | Admitting: Internal Medicine

## 2020-05-03 MED FILL — NOVOLOG FLEXPEN SYRINGE: 100 | 25 days supply | Qty: 9 | Fill #1

## 2020-05-03 MED FILL — VICTOZA 2-PAK 18 MG/3 ML PE: 18 | 30 days supply | Qty: 6 | Fill #3

## 2020-05-10 ENCOUNTER — Ambulatory Visit: Payer: Self-pay | Admitting: Behavioral Health

## 2020-05-10 DIAGNOSIS — F411 Generalized anxiety disorder: Secondary | ICD-10-CM
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
## 2020-05-22 MED FILL — LOSARTAN POTASSIUM 50 MG TA: 50 | 30 days supply | Qty: 30 | Fill #1

## 2020-05-22 MED FILL — CARVEDILOL 25 MG TABLET: 25 | 30 days supply | Qty: 60 | Fill #2

## 2020-05-22 MED FILL — CHLORTHALIDONE 25 MG TABS: 25 | 30 days supply | Qty: 30 | Fill #4

## 2020-05-22 MED FILL — SERTRALINE HCL 50 MG TABLET: 50 | 30 days supply | Qty: 30 | Fill #1

## 2020-05-23 ENCOUNTER — Other Ambulatory Visit: Payer: Self-pay | Admitting: Internal Medicine

## 2020-05-23 DIAGNOSIS — E0800 Diabetes mellitus due to underlying condition with hyperosmolarity without nonketotic hyperglycemic-hyperosmolar coma (NKHHC): Secondary | ICD-10-CM

## 2020-05-23 MED ORDER — LIRAGLUTIDE 18 MG/3ML ~~LOC~~ SOPN: 1.8000 mg | PEN_INJECTOR | Freq: Every day | SUBCUTANEOUS | 2 refills | Status: DC

## 2020-05-23 NOTE — Assessment & Plan Note (Signed)
Diabetes mellitus: Uncontrolled.  A1c today is 11.7% from 11% when it was last checked.  He states that his home blood glucose monitoring has ranged from 200s-290s in the morning however sometimes he would get as high up to the 300s.  Unfortunately he did not bring his glucometer today and we were not able to get an accurate measurement of how his glucose readings have been at home.  Plan: -Continue Victoza 1.8mg daily -Increase Lantus from 20 units to 25 units twice daily -Increase NovoLog from 12 units to 15 units 3 times a day -Add Metformin XL 500 mg daily (reports of prior episodes of hallucination with Metformin but willing to try it again) -Prescription given for new glucometer 

## 2020-05-24 ENCOUNTER — Encounter: Payer: Self-pay | Admitting: Dietician

## 2020-05-24 ENCOUNTER — Ambulatory Visit: Payer: Self-pay | Admitting: Behavioral Health

## 2020-05-24 MED FILL — VICTOZA 18 MG/3 ML INJECT P: 18 | 30 days supply | Qty: 9 | Fill #0

## 2020-05-25 ENCOUNTER — Telehealth: Payer: Self-pay

## 2020-05-25 NOTE — Telephone Encounter (Signed)
Received TC from patient who states he wants to notify his physician he tested positive for Covid yesterday.  Patient states he was exposed to someone at his choir practice.  States he has a slight h/a and congestion, but otherwise, is feeling pretty well.  He was instructed on quarentine/isolation, handwashing, mask wearing per CDC guidelines.  He was instructed if he develops any SOB or chest pain to seek emergency care at ED/urgent care and he verbalized understanding.  He states he received 2 Covid vaccines, but did not receive his booster. SChaplin, RN,BSN

## 2020-05-30 MED FILL — NOVOLOG FLEXPEN SYRINGE: 100 | 25 days supply | Qty: 9 | Fill #2

## 2020-05-30 MED FILL — LANTUS SOLOSTAR 100 UNITS/M: 100 | 30 days supply | Qty: 12 | Fill #1

## 2020-06-09 ENCOUNTER — Other Ambulatory Visit: Payer: Self-pay | Admitting: Internal Medicine

## 2020-06-09 MED ORDER — LOSARTAN POTASSIUM 100 MG PO TABS: 100.0000 mg | ORAL_TABLET | Freq: Every day | ORAL | 3 refills | Status: DC

## 2020-06-14 ENCOUNTER — Other Ambulatory Visit: Payer: Self-pay

## 2020-06-14 ENCOUNTER — Other Ambulatory Visit: Payer: Self-pay | Admitting: Student

## 2020-06-14 DIAGNOSIS — I509 Heart failure, unspecified: Secondary | ICD-10-CM

## 2020-06-14 MED ORDER — CHLORTHALIDONE 25 MG PO TABS: 25.0000 mg | ORAL_TABLET | Freq: Every day | ORAL | 1 refills | Status: DC

## 2020-06-15 MED FILL — CHLORTHALIDONE 25 MG TABS: 25 | 30 days supply | Qty: 30 | Fill #5

## 2020-06-15 MED FILL — LOSARTAN POTASSIUM 100 MG T: 100 | 30 days supply | Qty: 30 | Fill #0

## 2020-06-15 MED FILL — CARVEDILOL 25 MG TABLET: 25 | 30 days supply | Qty: 60 | Fill #3

## 2020-06-21 NOTE — Addendum Note (Signed)
Addended by: Hulan Fray on: 06/21/2020 05:49 PM   Modules accepted: Orders

## 2020-06-26 MED FILL — LANTUS SOLOSTAR 100 UNITS/M: 100 | 30 days supply | Qty: 12 | Fill #2

## 2020-06-26 MED FILL — VICTOZA 18 MG/3 ML INJECT P: 18 | 30 days supply | Qty: 9 | Fill #1

## 2020-06-26 MED FILL — NOVOLOG FLEXPEN SYRINGE: 100 | 25 days supply | Qty: 9 | Fill #3

## 2020-06-29 NOTE — Addendum Note (Signed)
Addended by: Hulan Fray on: 06/29/2020 02:43 PM   Modules accepted: Orders

## 2020-07-04 ENCOUNTER — Other Ambulatory Visit: Payer: Self-pay | Admitting: Internal Medicine

## 2020-07-04 NOTE — Addendum Note (Signed)
Addended by: Jean Rosenthal on: 07/04/2020 09:24 AM   Modules accepted: Orders

## 2020-07-19 MED FILL — CARVEDILOL 25 MG TABLET: 25 | 30 days supply | Qty: 60 | Fill #4

## 2020-07-19 MED FILL — LOSARTAN POTASSIUM 100 MG T: 100 | 30 days supply | Qty: 30 | Fill #1

## 2020-07-19 MED FILL — CHLORTHALIDONE 25 MG TABS: 25 | 30 days supply | Qty: 30 | Fill #2

## 2020-07-21 ENCOUNTER — Other Ambulatory Visit: Payer: Self-pay | Admitting: Internal Medicine

## 2020-07-21 DIAGNOSIS — E0800 Diabetes mellitus due to underlying condition with hyperosmolarity without nonketotic hyperglycemic-hyperosmolar coma (NKHHC): Secondary | ICD-10-CM

## 2020-07-21 MED FILL — NOVOLOG FLEXPEN SYRINGE: 100 | 25 days supply | Qty: 9 | Fill #4

## 2020-07-21 MED FILL — LANTUS SOLOSTAR 100 UNITS/M: 100 | 30 days supply | Qty: 12 | Fill #0

## 2020-07-21 MED FILL — VICTOZA 18 MG/3 ML INJECT P: 18 | 30 days supply | Qty: 9 | Fill #2

## 2020-08-01 ENCOUNTER — Ambulatory Visit: Payer: Self-pay | Admitting: Dietician

## 2020-08-02 ENCOUNTER — Encounter: Payer: Self-pay | Admitting: Student

## 2020-08-09 ENCOUNTER — Encounter: Payer: Self-pay | Admitting: Behavioral Health

## 2020-08-09 ENCOUNTER — Other Ambulatory Visit: Payer: Self-pay

## 2020-08-10 ENCOUNTER — Ambulatory Visit: Payer: Self-pay | Admitting: Behavioral Health

## 2020-08-10 DIAGNOSIS — F411 Generalized anxiety disorder: Secondary | ICD-10-CM

## 2020-08-10 NOTE — BH Specialist Note (Addendum)
Integrated Behavioral Health Follow Up In-Person Visit  MRN: [id number] Name: Thomas Gardner  Number of [place] Clinician visits: 5/6 Session Start time: 9:30am  Session End time: 10:00am Total time: 30 minutes  Types of Service: Individual psychotherapy  Interpretor:No. Interpretor Name and Language: n/a   Subjective: Thomas Gardner is a 65 y.o. male accompanied by self Patient was referred by Dessie Coma, LCSW for continuity of care Patient reports the following symptoms/concerns: procrastination & dep Sx Duration of problem: ~ one month; Severity of problem: mild  Objective: Mood: Anxious and Depressed and Affect: Appropriate Risk of harm to self or others: No plan to harm self or others  Life Context: Family and Social: Pt lives alone & has good neighbor support. He caught COVID-19 in Jan at [employer] in mid-Jan. He was very fatigued & had bad headaches. This put Pt into a slump by the end of Jan. His friends put him in a Hotel for several days & he recovered quicker w/some peaceful sleep & change of location. 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 He is, "staying in my own lane" at both jobs to reduce drama & impact on himself.  Self-Care: Pt is keeping stronger boundaries @ work-this has inc'd his coping. Life Changes: Pt has recovered from COVID-19, but still has occasional headaches. Pt has lacked motivation to complete his [employer]. Pt is using his self-talk to put himself at ease over his worries. He is advocating for himself w/a new job opportunity.  Patient and/or Family's Strengths/Protective Factors: Social connections, Social and Emotional competence, Concrete supports in place (healthy food, safe environments, etc.), Sense of purpose and Physical Health (exercise, healthy diet, medication compliance, etc.)-Pt still wants an exercise routine that  works for him.  Goals Addressed: Patient will: 1.  Reduce symptoms of: anxiety and depression  2.  Increase knowledge and/or ability of: healthy habits and stress reduction  3.  Demonstrate ability to: Increase healthy adjustment to current life circumstances and Increase motivation to adhere to plan of care  Progress towards Goals: Ongoing  Interventions: Interventions utilized:  Solution-Focused Strategies, Behavioral Activation and Supportive Counseling Standardized Assessments completed: Not Needed  Patient and/or Family Response: Pt receptive to f61f visit today & requests future appts  Patient Centered Plan: Patient is on the following Treatment Plan(s): Pt will inc his boundary-setting as this is really assisting him to cope. Pt will explore ways he can incorporate his desire for exercise into his daily life. Assessment: Patient currently experiencing dec in dep Sx after downtick in Jan. Pt just beginning to feel healthier post COVID infection. Pt wants to f/u on the things that are lft undone so he can feel better about his efforts & lift his dep.  Patient may benefit from cont's support & care from [facility] w/ resources & tools to get life on track he desires.  Plan: 1. Follow up with behavioral health clinician on : 2-3 wks f32f for 60 min 2. Behavioral recommendations: Write more, record thoughts btwn sessions, cont to reduce overall stress w/compassionate self-talk 3. Referral(s): [place] (In Clinic) 4. "From scale of 1-10, how likely are you to follow plan?": [place], LMFT  Pt was seen on Wed, 08/09/2020 @9 :30am - Time addended to process Progress Note

## 2020-08-14 ENCOUNTER — Other Ambulatory Visit: Payer: Self-pay | Admitting: Internal Medicine

## 2020-08-14 DIAGNOSIS — E0800 Diabetes mellitus due to underlying condition with hyperosmolarity without nonketotic hyperglycemic-hyperosmolar coma (NKHHC): Secondary | ICD-10-CM

## 2020-08-14 DIAGNOSIS — Z794 Long term (current) use of insulin: Secondary | ICD-10-CM

## 2020-08-14 MED FILL — CARVEDILOL 25 MG TABLET: 25 | 30 days supply | Qty: 60 | Fill #5

## 2020-08-14 MED FILL — VICTOZA 18 MG/3 ML INJECT P: 18 | 30 days supply | Qty: 9 | Fill #1

## 2020-08-14 MED FILL — LOSARTAN POTASSIUM 100 MG T: 100 | 30 days supply | Qty: 30 | Fill #2

## 2020-08-14 MED FILL — LANTUS SOLOSTAR 100 UNITS/M: 100 | 30 days supply | Qty: 12 | Fill #1

## 2020-08-14 MED FILL — CHLORTHALIDONE 25 MG TABS: 25 | 30 days supply | Qty: 30 | Fill #3

## 2020-08-16 ENCOUNTER — Encounter: Payer: Self-pay | Admitting: Student

## 2020-08-16 ENCOUNTER — Ambulatory Visit: Payer: Self-pay | Admitting: Student

## 2020-08-16 ENCOUNTER — Other Ambulatory Visit: Payer: Self-pay | Admitting: Student

## 2020-08-16 VITALS — BP 138/75 | HR 92 | Temp 98.6°F | Ht 66.0 in | Wt 255.3 lb

## 2020-08-16 DIAGNOSIS — I10 Essential (primary) hypertension: Secondary | ICD-10-CM

## 2020-08-16 DIAGNOSIS — E0800 Diabetes mellitus due to underlying condition with hyperosmolarity without nonketotic hyperglycemic-hyperosmolar coma (NKHHC): Secondary | ICD-10-CM

## 2020-08-16 DIAGNOSIS — Z794 Long term (current) use of insulin: Secondary | ICD-10-CM

## 2020-08-16 DIAGNOSIS — R600 Localized edema: Secondary | ICD-10-CM

## 2020-08-16 DIAGNOSIS — R0982 Postnasal drip: Secondary | ICD-10-CM

## 2020-08-16 LAB — POCT GLYCOSYLATED HEMOGLOBIN (HGB A1C): Hemoglobin A1C: 10.1 % — AB (ref 4.0–5.6)

## 2020-08-16 LAB — GLUCOSE, CAPILLARY: Glucose-Capillary: 312 mg/dL — ABNORMAL HIGH (ref 70–99)

## 2020-08-16 MED ORDER — CANAGLIFLOZIN 100 MG PO TABS: 100.0000 mg | ORAL_TABLET | Freq: Every day | ORAL | 0 refills | Status: DC

## 2020-08-16 MED FILL — INVOKANA 100 MG TABLET: 100 | 30 days supply | Qty: 30 | Fill #0

## 2020-08-16 NOTE — Progress Notes (Signed)
   CC: diabetes follow-up  HPI:  [name] is a 64 y.o. with medical history as below presenting to [facility] for diabetes follow-up.  Please see problem-based list for further details, assessments, and plans.  Past Medical History:  Diagnosis Date  . Arthritis   . Asthma   . CHF (congestive heart failure) ([place])   . Diabetes mellitus 2003  . GERD (gastroesophageal reflux disease)   . Hyperlipidemia   . Hypertension   . Morbid obesity ([place])   . Sleep apnea    Review of Systems:  As per HPI  Physical Exam:  Vitals:   08/16/20 1329  BP: 138/75  Pulse: 92  Temp: 98.6 F (37 C)  TempSrc: Oral  SpO2: 98%  Weight: 255 lb 4.8 oz (115.8 kg)  Height: 5\' 6"  (1.676 m)   General: Sitting comfortably in chair, no acute distress CV: Regular rate, rhythm. No murmurs, rubs, gallops appreciated. Pulm: Normal WOB. Clear to auscultation bilaterally. No rales, wheezing, rhonchi. MSK: 1+ pitting edema to knees bilaterally.  Assessment & Plan:   See Encounters Tab for problem based charting.  Patient discussed with Dr. Heber Tuttle

## 2020-08-16 NOTE — Patient Instructions (Signed)
[name],  It was a pleasure seeing you today!  Today we discussed your diabetes, blood pressure, heart failure, and congestion.  For your diabetes: I would like to start you on canagliflozin. This medication has a risk of yeast infections. If you get a yeast infection, you can take lamisil cream and hold the medication for a week.   For your blood pressure and heart failure: We will continue with your current medications. Please make sure to keep a log of your blood pressures as well.  For your congestion, I suggest using a Neti pot as well as Coricidin HBP, which you can get over the counter.  Please make sure to arrive 15 minutes prior to your next appointment. If you arrive late, you may be asked to reschedule.   We look forward to seeing you next time. Please call our clinic at [phone] if you have any questions or concerns. The best time to call is Monday-Friday from 9am-4pm, but there is someone available 24/7. If after hours or the weekend, call the main hospital number and ask for the Internal Medicine Resident On-Call. If you need medication refills, please notify your pharmacy one week in advance and they will send [place] a request.  Thank you for letting us take part in your care. Wishing you the best!  Thank you, Dr. Sanjuan Dame, MD

## 2020-08-17 ENCOUNTER — Other Ambulatory Visit: Payer: Self-pay | Admitting: Student

## 2020-08-17 DIAGNOSIS — Z794 Long term (current) use of insulin: Secondary | ICD-10-CM

## 2020-08-17 MED ORDER — NOVOLOG FLEXPEN 100 UNIT/ML ~~LOC~~ SOPN: 15.0000 [IU] | PEN_INJECTOR | Freq: Three times a day (TID) | SUBCUTANEOUS | 2 refills | Status: DC

## 2020-08-18 DIAGNOSIS — R0982 Postnasal drip: Secondary | ICD-10-CM | POA: Insufficient documentation

## 2020-08-18 NOTE — Assessment & Plan Note (Signed)
Patient reports he has had increasing congestion over the last few weeks. Mentions he has experienced mild productive cough with white/yellow sputum, mild intermittent sore throat, and rhinorrhea. Denies fevers, chills, eye pain, otorrhea, sick contacts.  Most likely due to post-nasal drip. Discussed using over the counter Neti pot to assist with congestion as well as Coricidin HBP to avoid increasing blood pressure. - Neti pot - Coricidin HBP

## 2020-08-18 NOTE — Assessment & Plan Note (Signed)
Patient reports compliance with his diabetic medications. States his blood sugars at home are consistently in the 200's and intermittently increases to 200's. Denies any episodes of hypoglycemia. He did not bring his glucometer today. A1c 10.1% down from 11.7% at last visit.in December.   Plan to add SGLT2i today. Previously was taking but was stopped due to yeast infection. Discussed with patient side effects of possible infection, and if he experiences a yeast infection that he should hold the medication for a week while treating the infection. Hopeful addition of SGLT2i will also assist in blood pressure control and help his lower extremity edema. - Start canagliflozin 100mg  qd - Continue Victoza 1.8mg  daily - Continue Lantus 20u BID - Continue Novolog 15u TID - Continue Metformin 500mg  qd - Re-check A1c in three months

## 2020-08-18 NOTE — Assessment & Plan Note (Signed)
Patient reports he continues to have lower extremity edema that worsens at night. Specifically mentions his left leg swells more than his right leg. Reports that he sleeps with 2-3 pillows at night, which has been normal for him for "a long time." Denies PND, dyspnea, chest pain.  Discussed with patient that likely his swelling is due to venous insufficiency. Discussed using compression stockings to help with the swelling. Likely will need further work-up once financial assistance can be obtained.  - Compression stockings

## 2020-08-18 NOTE — Assessment & Plan Note (Addendum)
BP Readings from Last 3 Encounters:  08/16/20 138/75  04/26/20 138/69  04/18/20 (!) 163/86   Patient reports compliance with medications. He used to have blood pressure cuff at home, but no longer has it. Encouraged patient to keep blood pressure log and bring to clinic at next visit. Hopeful addition of SGLT2i will further improve blood pressure. - Continue coreg 25mg  qd - Continue chlorthalidone 25mg  qd - Continue losartan 100mg  qd

## 2020-08-19 ENCOUNTER — Other Ambulatory Visit (HOSPITAL_COMMUNITY): Payer: Self-pay

## 2020-08-19 NOTE — Progress Notes (Signed)
Internal Medicine Clinic Attending ? ?Case discussed with Dr. Braswell  At the time of the visit.  We reviewed the resident?s history and exam and pertinent patient test results.  I agree with the assessment, diagnosis, and plan of care documented in the resident?s note.  ?

## 2020-08-30 ENCOUNTER — Other Ambulatory Visit: Payer: Self-pay

## 2020-08-30 ENCOUNTER — Ambulatory Visit: Payer: Self-pay | Admitting: Behavioral Health

## 2020-08-30 DIAGNOSIS — F331 Major depressive disorder, recurrent, moderate: Secondary | ICD-10-CM

## 2020-08-30 DIAGNOSIS — F419 Anxiety disorder, unspecified: Secondary | ICD-10-CM

## 2020-08-30 NOTE — BH Specialist Note (Signed)
Integrated Behavioral Health Follow Up In-Person Visit  MRN: [id number] Name: Thomas Gardner  Number of [place] Clinician visits: 6/6 Session Start time: 10:00am  Session End time: 10:50am Total time: 50  minutes  Types of Service: Individual psychotherapy  Interpretor:No. Interpretor Name and Language: n/a   Subjective: Thomas Gardner is a 65 y.o. male accompanied by self Patient was referred by [name], LCSW for continuity of care. Patient reports the following symptoms/concerns: Pt has recently exp'd the sudden & unexpected death of a member of a Friend Grp he supports & influences-this has caused sadness. Duration of problem: one week; Severity of problem: mild  Objective: Mood: Anxious and Depressed and Affect: Appropriate Risk of harm to self or others: No plan to harm self or others  Life Context: Family and Social: Pt has many good friends; Family is less sparse, but his Str recently asked him to contact his Bros in [place] who is high need. Pt has maintained a steady boundary w/this Bros to keep himself happy. 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 Self-Care: Pt has inc'd his self-care practices & along w/his new medication for his DM he has inc'd energy & motivation. Life Changes: Pt has been in constant adjustment to the multiple deaths around him. He is changing his perspective so he can have greater sense of empowerment over his world.  Patient and/or Family's Strengths/Protective Factors: Social connections, Social and Emotional competence, Concrete supports in place (healthy food, safe environments, etc.), Sense of purpose and Physical Health (exercise, healthy diet, medication compliance, etc.)  Goals Addressed: Patient will: 1.  Reduce symptoms of: stress and grief & loss unexpectedly  2.  Increase knowledge and/or ability of: healthy habits and stress reduction  3.  Demonstrate ability to:  Increase healthy adjustment to current life circumstances  Progress towards Goals: Ongoing  Interventions: Interventions utilized:  Solution-Focused Strategies, Behavioral Activation and Supportive Counseling Standardized Assessments completed: Not Needed  Patient and/or Family Response: Pt receptive to today's visit & requests future appt  Patient Centered Plan: Patient is on the following Treatment Plan(s): Practice of self-care strategies & mvmt towards addressing home environment Assessment: Patient currently experiencing dec'd anx & worry over job duties & responsibilities towards [employer].   Patient may benefit from cont'd support from Clinician for accountability, tools, & ideas..  Plan: 1. Follow up with behavioral health clinician on : 2-3 wks f:f for 60 min 2. Behavioral recommendations: Cont using your successful strategies 3. Referral(s): [place] (In Clinic) 4. "From scale of 1-10, how likely are you to follow plan?": [place], LMFT

## 2020-09-06 ENCOUNTER — Ambulatory Visit: Payer: Self-pay | Admitting: Dermatology

## 2020-09-08 ENCOUNTER — Other Ambulatory Visit: Payer: Self-pay | Admitting: Internal Medicine

## 2020-09-08 ENCOUNTER — Other Ambulatory Visit (HOSPITAL_COMMUNITY): Payer: Self-pay

## 2020-09-08 ENCOUNTER — Other Ambulatory Visit: Payer: Self-pay | Admitting: Student

## 2020-09-08 DIAGNOSIS — E0800 Diabetes mellitus due to underlying condition with hyperosmolarity without nonketotic hyperglycemic-hyperosmolar coma (NKHHC): Secondary | ICD-10-CM

## 2020-09-08 DIAGNOSIS — I10 Essential (primary) hypertension: Secondary | ICD-10-CM

## 2020-09-08 MED ORDER — CANAGLIFLOZIN 100 MG PO TABS
100.0000 mg | ORAL_TABLET | Freq: Every day | ORAL | 2 refills | Status: DC
  Filled 2020-09-08: qty 30, 30d supply, fill #0
  Filled 2020-10-03: qty 30, 30d supply, fill #1
  Filled 2020-11-15: qty 30, 30d supply, fill #2

## 2020-09-08 MED ORDER — VICTOZA 18 MG/3ML ~~LOC~~ SOPN
1.8000 mg | PEN_INJECTOR | Freq: Every day | SUBCUTANEOUS | 2 refills | Status: DC
  Filled 2020-09-08: qty 9, 30d supply, fill #0
  Filled 2020-10-30: qty 9, 30d supply, fill #1
  Filled 2020-11-27: qty 9, 30d supply, fill #2

## 2020-09-08 MED ORDER — CARVEDILOL 25 MG PO TABS
25.0000 mg | ORAL_TABLET | Freq: Two times a day (BID) | ORAL | 1 refills | Status: DC
  Filled 2020-09-08: qty 60, 30d supply, fill #0
  Filled 2020-10-03: qty 60, 30d supply, fill #1
  Filled 2020-11-15: qty 60, 30d supply, fill #2
  Filled 2020-12-20: qty 60, 30d supply, fill #3
  Filled 2021-01-19: qty 60, 30d supply, fill #4
  Filled 2021-02-13: qty 60, 30d supply, fill #5

## 2020-09-08 MED FILL — Chlorthalidone Tab 25 MG: ORAL | 30 days supply | Qty: 30 | Fill #0 | Status: AC

## 2020-09-08 MED FILL — Insulin Aspart Soln Pen-injector 100 Unit/ML: SUBCUTANEOUS | 20 days supply | Qty: 9 | Fill #0 | Status: AC

## 2020-09-08 MED FILL — Insulin Glargine Soln Pen-Injector 100 Unit/ML: SUBCUTANEOUS | 30 days supply | Qty: 12 | Fill #0 | Status: AC

## 2020-09-08 MED FILL — Losartan Potassium Tab 100 MG: ORAL | 30 days supply | Qty: 30 | Fill #0 | Status: AC

## 2020-09-18 ENCOUNTER — Ambulatory Visit: Payer: Self-pay | Admitting: Dermatology

## 2020-09-18 NOTE — Addendum Note (Signed)
Addended by: Hulan Fray on: 09/18/2020 05:47 PM   Modules accepted: Orders

## 2020-09-27 ENCOUNTER — Ambulatory Visit: Payer: Self-pay | Admitting: Behavioral Health

## 2020-10-03 ENCOUNTER — Other Ambulatory Visit: Payer: Self-pay | Admitting: Internal Medicine

## 2020-10-03 ENCOUNTER — Other Ambulatory Visit (HOSPITAL_COMMUNITY): Payer: Self-pay

## 2020-10-03 DIAGNOSIS — Z794 Long term (current) use of insulin: Secondary | ICD-10-CM

## 2020-10-03 MED ORDER — LANTUS SOLOSTAR 100 UNIT/ML ~~LOC~~ SOPN
PEN_INJECTOR | SUBCUTANEOUS | 2 refills | Status: DC
  Filled 2020-10-03: qty 12, 30d supply, fill #0
  Filled 2020-10-03: qty 12, fill #0
  Filled 2020-10-30: qty 12, 30d supply, fill #1
  Filled 2020-11-27: qty 12, 30d supply, fill #2

## 2020-10-03 MED FILL — Chlorthalidone Tab 25 MG: ORAL | 30 days supply | Qty: 30 | Fill #1 | Status: AC

## 2020-10-03 MED FILL — Insulin Aspart Soln Pen-injector 100 Unit/ML: SUBCUTANEOUS | 20 days supply | Qty: 9 | Fill #1 | Status: AC

## 2020-10-03 MED FILL — Metformin HCl Tab ER 24HR 500 MG: ORAL | 30 days supply | Qty: 30 | Fill #0 | Status: AC

## 2020-10-03 MED FILL — Sertraline HCl Tab 50 MG: ORAL | 30 days supply | Qty: 30 | Fill #0 | Status: AC

## 2020-10-03 MED FILL — Losartan Potassium Tab 100 MG: ORAL | 30 days supply | Qty: 30 | Fill #1 | Status: AC

## 2020-10-04 ENCOUNTER — Other Ambulatory Visit (HOSPITAL_COMMUNITY): Payer: Self-pay

## 2020-10-11 ENCOUNTER — Encounter: Payer: Self-pay | Admitting: *Deleted

## 2020-10-18 ENCOUNTER — Ambulatory Visit: Payer: Self-pay | Admitting: Behavioral Health

## 2020-10-18 DIAGNOSIS — F419 Anxiety disorder, unspecified: Secondary | ICD-10-CM

## 2020-10-18 DIAGNOSIS — F331 Major depressive disorder, recurrent, moderate: Secondary | ICD-10-CM

## 2020-10-18 NOTE — BH Specialist Note (Signed)
Integrated Behavioral Health Follow Up In-Person Visit  MRN: [id number] Name: Thomas Gardner  Number of [place] Clinician visits: 7 Session Start time: 10:00am  Session End time: 10:45am Total time: 45  minutes  Types of Service: Individual psychotherapy  Interpretor:No. Interpretor Name and Language: n/a   Subjective: [name] is a 65 y.o. male accompanied by self Patient was referred by Dessie Coma, LCSW for continuity of care. 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 He is having better boundaries & saying No! Duration of problem: months; Severity of problem: mild  Objective: Mood: Depressed and Affect: Appropriate Risk of harm to self or others: No plan to harm self or others  Life Context: Family and Social: Pt has many good friends & family. He is takg a trip in Sept to the [place] w/5 good friends. 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 Self-Care: Pt has been working on himself & happy he is saying no to protect his time. Life Changes: Pt is worried for his Mayer Masker who has cancer.  Patient and/or Family's Strengths/Protective Factors: Social connections, Social and Emotional competence, Concrete supports in place (healthy food, safe environments, etc.), Sense of purpose and Physical Health (exercise, healthy diet, medication compliance, etc.)  Goals Addressed: Patient will: 1.  Reduce symptoms of: anxiety, depression and stress  2.  Increase knowledge and/or ability of: coping skills, healthy habits and stress reduction  3.  Demonstrate ability to: Increase healthy adjustment to current life circumstances  Progress towards Goals: Ongoing  Interventions: Interventions utilized:  CBT Cognitive Behavioral Therapy and Supportive Counseling Standardized  Assessments completed: screeners prn  Patient and/or Family Response: Pt receptive to f:f visit today & scheduled future appt.  Patient Centered Plan: Patient is on the following Treatment Plan(s): cont to set & keep healthy boundaries w/others. Use Sleep Hygiene HO to improve sleep habits. Assessment: Patient currently experiencing health status changes that are worrying him. He feels he is doing all he can & still having health issues.Pt is dep'd due to his night job where he is trying to do less & not get taken advantage of by Co-Workers.  Patient may benefit from cont'd attn to Sx of anx/dep & making a healthier place in the world for himself; with friends & Co-Workers.  Plan: 1. Follow up with behavioral health clinician on : 2-3 wks f:f for 60 min 2. Behavioral recommendations: Journal/take notes btwn sessions. Try the sleep hygiene suggestions to your capacity & report back. 3. Referral(s): [place] (In Clinic) 4. "From scale of 1-10, how likely are you to follow plan?": [place], LMFT

## 2020-10-19 ENCOUNTER — Encounter: Payer: Self-pay | Admitting: Internal Medicine

## 2020-10-23 ENCOUNTER — Encounter: Payer: Self-pay | Admitting: Internal Medicine

## 2020-10-25 ENCOUNTER — Encounter: Payer: Self-pay | Admitting: Internal Medicine

## 2020-10-30 ENCOUNTER — Other Ambulatory Visit (HOSPITAL_COMMUNITY): Payer: Self-pay

## 2020-10-30 MED FILL — Insulin Aspart Soln Pen-injector 100 Unit/ML: SUBCUTANEOUS | 20 days supply | Qty: 9 | Fill #2 | Status: AC

## 2020-11-01 ENCOUNTER — Ambulatory Visit (INDEPENDENT_AMBULATORY_CARE_PROVIDER_SITE_OTHER): Payer: Self-pay | Admitting: Student

## 2020-11-01 ENCOUNTER — Other Ambulatory Visit: Payer: Self-pay

## 2020-11-01 VITALS — BP 124/70 | HR 88 | Temp 98.5°F | Ht 66.0 in | Wt 249.5 lb

## 2020-11-01 DIAGNOSIS — I509 Heart failure, unspecified: Secondary | ICD-10-CM

## 2020-11-01 DIAGNOSIS — R0609 Other forms of dyspnea: Secondary | ICD-10-CM

## 2020-11-01 DIAGNOSIS — R252 Cramp and spasm: Secondary | ICD-10-CM

## 2020-11-01 DIAGNOSIS — R0600 Dyspnea, unspecified: Secondary | ICD-10-CM

## 2020-11-01 DIAGNOSIS — E0800 Diabetes mellitus due to underlying condition with hyperosmolarity without nonketotic hyperglycemic-hyperosmolar coma (NKHHC): Secondary | ICD-10-CM

## 2020-11-01 DIAGNOSIS — Z794 Long term (current) use of insulin: Secondary | ICD-10-CM

## 2020-11-01 DIAGNOSIS — I10 Essential (primary) hypertension: Secondary | ICD-10-CM

## 2020-11-01 LAB — POCT GLYCOSYLATED HEMOGLOBIN (HGB A1C): Hemoglobin A1C: 9 % — AB (ref 4.0–5.6)

## 2020-11-01 LAB — GLUCOSE, CAPILLARY: Glucose-Capillary: 231 mg/dL — ABNORMAL HIGH (ref 70–99)

## 2020-11-01 MED ORDER — INSULIN ASPART 100 UNIT/ML FLEXPEN
18.0000 [IU] | PEN_INJECTOR | Freq: Three times a day (TID) | SUBCUTANEOUS | 2 refills | Status: DC
  Filled 2020-11-28: qty 9, 17d supply, fill #0

## 2020-11-01 NOTE — Patient Instructions (Signed)
Thomas Gardner, it was a pleasure seeing you today!  I have ordered the following labs today:  Lab Orders  Glucose, capillary  POC Hbg A1C    Tests ordered today:  ECHOCARDIOGRAM  Referrals ordered today:    Referral Orders  Amb Referral to Clinical Pharmacist    I have ordered the following medication/changed the following medications:   Stop the following medications: Medications Discontinued During This Encounter  Medication Reason   insulin aspart (NOVOLOG) 100 UNIT/ML FlexPen      Start the following medications: Meds ordered this encounter  Medications   insulin aspart (NOVOLOG) 100 UNIT/ML FlexPen    Sig: Inject 18 Units into the skin in the morning, at noon, and at bedtime.    Dispense:  9 mL    Refill:  2     Follow-up: 3 months    Today we discussed: - Diabetes: I would like for you to increase your mealtime coverage to 18 units three times daily.  - Shortness of breath: I have ordered an echocardiogram. Depending on these results, we might have you return to your cardiologist.  Please make sure to arrive 15 minutes prior to your next appointment. If you arrive late, you may be asked to reschedule.   We look forward to seeing you next time. Please call our clinic at [phone] if you have any questions or concerns. The best time to call is Monday-Friday from 9am-4pm, but there is someone available 24/7. If after hours or the weekend, call the main hospital number and ask for the Internal Medicine Resident On-Call. If you need medication refills, please notify your pharmacy one week in advance and they will send [place] a request.  Thank you for letting us take part in your care. Wishing you the best!  Thank you, Sanjuan Dame, MD

## 2020-11-01 NOTE — Progress Notes (Signed)
   CC: diabetes follow-up  HPI:  [name] is a 65 y.o. with medical history as below presenting to [facility] for diabetes follow-up.  Please see problem-based list for further details, assessments, and plans.  Past Medical History:  Diagnosis Date   Arthritis    Asthma    CHF (congestive heart failure) ([place])    Diabetes mellitus 2003   GERD (gastroesophageal reflux disease)    Hyperlipidemia    Hypertension    Morbid obesity ([place])    Sleep apnea    Review of Systems:  As per HPI  Physical Exam:  Vitals:   11/01/20 0953  BP: 124/70  Pulse: 88  Temp: 98.5 F (36.9 C)  TempSrc: Oral  SpO2: 97%  Weight: 249 lb 8 oz (113.2 kg)  Height: 5\' 6"  (1.676 m)   General: Well-developed, well-nourished. No acute distress CV: Regular rate, rhythm. Normal S1, S2. Pulm: Normal work of breathing on room air. Clear to auscultation bilaterally. MSK: Normal bulk, tone. No pitting edema bilaterally. Neuro: Awake, alert, answering questions appropriately  Assessment & Plan:   See Encounters Tab for problem based charting.  Patient discussed with Dr. Evette Doffing

## 2020-11-01 NOTE — Assessment & Plan Note (Addendum)
Previous work-up in 2013 revealed non-ischemic cardiomyopathy with EF 37% on myocardial perfusion imaging. Today, [name] reports he is having increasing dyspnea on exertion over the last year. Mentions that currently it is difficult to walk from the car to the grocery store and is inquiring about a handicap tag due to this. He is able to do daily chores and ADL's without much difficulty. He denies any chest pain during this time. [name] does mention that he feels palpitations sometimes when not exerting himself, but this usually spontaneously resolves. He further denies orthopnea or lower extremity edema.  Currently [name] is on goal-directed medical therapy for NYHA class II heart failure, including ARB, beta blocker, SGLT2i. He has not followed-up with cardiology. Given his worsening dyspnea on exertion without recent cardiac work-up, will order TTE today. He would further benefit from ARNI, but unfortunately there are financial barriers in the way of this. Encouraged [name] to make sure he has the financial paperwork filled out to obtain an orange card. - Echocardiogram ordered - BMP today

## 2020-11-02 ENCOUNTER — Encounter: Payer: Self-pay | Admitting: Student

## 2020-11-02 ENCOUNTER — Ambulatory Visit: Payer: Self-pay | Admitting: Behavioral Health

## 2020-11-02 LAB — BMP8+ANION GAP
Anion Gap: 18 mmol/L (ref 10.0–18.0)
BUN/Creatinine Ratio: 19 (ref 10–24)
BUN: 22 mg/dL (ref 8–27)
CO2: 23 mmol/L (ref 20–29)
Calcium: 9.5 mg/dL (ref 8.6–10.2)
Chloride: 98 mmol/L (ref 96–106)
Creatinine, Ser: 1.14 mg/dL (ref 0.76–1.27)
Glucose: 225 mg/dL — ABNORMAL HIGH (ref 65–99)
Potassium: 3.8 mmol/L (ref 3.5–5.2)
Sodium: 139 mmol/L (ref 134–144)
eGFR: 71 mL/min/{1.73_m2} (ref >59)

## 2020-11-02 NOTE — Progress Notes (Signed)
Internal Medicine Clinic Attending ? ?Case discussed with Dr. Braswell  At the time of the visit.  We reviewed the resident?s history and exam and pertinent patient test results.  I agree with the assessment, diagnosis, and plan of care documented in the resident?s note.  ?

## 2020-11-02 NOTE — Assessment & Plan Note (Signed)
Thomas Gardner reports bilateral lower extremity muscle cramping, specifically in his calves. Mentions that he has experienced this before and previously had issues with normalizing his potassium. We will check BMP today. Encouraged Thomas Gardner to continue to be as active as possible. - BMP today - Encourage exercise

## 2020-11-02 NOTE — Assessment & Plan Note (Signed)
BP Readings from Last 3 Encounters:  11/01/20 124/70  08/16/20 138/75  04/26/20 138/69   Thomas Gardner reports he has been doing well since the addition of the SGLT2i in March. Denies lightheadedness, chest pain, dyspnea. Will continue with current regimen. - Carvedilol 25mg  daily - Chlorthalidone 25mg  daily - Losartan 100mg  daily - Canagliflozin 100mg  daily

## 2020-11-02 NOTE — Assessment & Plan Note (Signed)
Thomas Gardner is presenting to clinic today for a follow-up visit for his type 2 diabetes mellitus. A1c today 9.0%, down from 10.1% three months ago. He was unable to bring his glucometer today, but reports sugars have been consistently 180-250, lowest being ~130. Mentions when it gets to 130, it is usually in the morning and he will sometimes feel "off", including lightheadedness. States that he usually feels better after he eats something. Post-prandial sugars are usually more elevated in the 200's. He does report compliance with his medications. Of note, Thomas Gardner does note that he cannot tolerate a higher dose of metformin due to side effects.  Congratulated Thomas Gardner for the improvement of his symptoms, although emphasized that we need to continue strive for tighter glycemic control to prevent cardiovascular events. Since his post-prandial sugars are more elevated, will increase his mealtime coverage. Overall believe he would benefit from an appointment with our clinical pharmacist. [name] agrees that further medication assistance would be helpful. - Clinical pharmacy referral - Increase Novolog 18 units three times daily w/ meals - Lantus 20 units twice daily - Victoza 1.8mg  daily - Metformin 500mg  daily - Return to clinic in three months

## 2020-11-15 ENCOUNTER — Other Ambulatory Visit (HOSPITAL_COMMUNITY): Payer: Self-pay

## 2020-11-15 ENCOUNTER — Encounter: Payer: Self-pay | Admitting: Pharmacist

## 2020-11-15 ENCOUNTER — Ambulatory Visit: Payer: Self-pay | Admitting: Behavioral Health

## 2020-11-15 MED FILL — Losartan Potassium Tab 100 MG: ORAL | 30 days supply | Qty: 30 | Fill #2 | Status: AC

## 2020-11-15 MED FILL — Chlorthalidone Tab 25 MG: ORAL | 30 days supply | Qty: 30 | Fill #2 | Status: AC

## 2020-11-21 ENCOUNTER — Encounter: Payer: Self-pay | Admitting: *Deleted

## 2020-11-27 ENCOUNTER — Other Ambulatory Visit (HOSPITAL_COMMUNITY): Payer: Self-pay

## 2020-11-27 ENCOUNTER — Other Ambulatory Visit: Payer: Self-pay | Admitting: Student

## 2020-11-27 DIAGNOSIS — E0800 Diabetes mellitus due to underlying condition with hyperosmolarity without nonketotic hyperglycemic-hyperosmolar coma (NKHHC): Secondary | ICD-10-CM

## 2020-11-28 ENCOUNTER — Other Ambulatory Visit: Payer: Self-pay | Admitting: Student

## 2020-11-28 ENCOUNTER — Other Ambulatory Visit (HOSPITAL_COMMUNITY): Payer: Self-pay

## 2020-11-28 DIAGNOSIS — E0800 Diabetes mellitus due to underlying condition with hyperosmolarity without nonketotic hyperglycemic-hyperosmolar coma (NKHHC): Secondary | ICD-10-CM

## 2020-11-28 MED ORDER — INSULIN ASPART 100 UNIT/ML FLEXPEN
18.0000 [IU] | PEN_INJECTOR | Freq: Three times a day (TID) | SUBCUTANEOUS | 2 refills | Status: DC
Start: 2020-11-28 — End: 2021-01-19
  Filled 2020-11-28 – 2020-12-20 (×2): qty 15, 27d supply, fill #0
  Filled 2021-01-19: qty 12, 22d supply, fill #1

## 2020-11-29 ENCOUNTER — Ambulatory Visit (HOSPITAL_COMMUNITY)
Admission: RE | Admit: 2020-11-29 | Discharge: 2020-11-29 | Disposition: A | Discharge status: Home / Self Care | Payer: Medicare Other | Source: Ambulatory Visit | Attending: Student in an Organized Health Care Education/Training Program | Admitting: Student in an Organized Health Care Education/Training Program

## 2020-11-29 DIAGNOSIS — E785 Hyperlipidemia, unspecified: Secondary | ICD-10-CM | POA: Diagnosis not present

## 2020-11-29 DIAGNOSIS — R0609 Other forms of dyspnea: Secondary | ICD-10-CM | POA: Diagnosis not present

## 2020-11-29 DIAGNOSIS — R0600 Dyspnea, unspecified: Secondary | ICD-10-CM | POA: Diagnosis present

## 2020-11-29 DIAGNOSIS — I509 Heart failure, unspecified: Secondary | ICD-10-CM | POA: Insufficient documentation

## 2020-11-29 DIAGNOSIS — G4730 Sleep apnea, unspecified: Secondary | ICD-10-CM | POA: Insufficient documentation

## 2020-11-29 DIAGNOSIS — E119 Type 2 diabetes mellitus without complications: Secondary | ICD-10-CM | POA: Diagnosis not present

## 2020-11-29 DIAGNOSIS — I110 Hypertensive heart disease with heart failure: Secondary | ICD-10-CM | POA: Insufficient documentation

## 2020-11-29 LAB — ECHOCARDIOGRAM COMPLETE
Area-P 1/2: 3.28 cm2
S' Lateral: 4.1 cm

## 2020-12-01 ENCOUNTER — Encounter: Payer: Self-pay | Admitting: Student

## 2020-12-06 ENCOUNTER — Other Ambulatory Visit (HOSPITAL_COMMUNITY): Payer: Self-pay

## 2020-12-18 NOTE — Addendum Note (Signed)
Addended by: Hulan Fray on: 12/18/2020 05:11 PM   Modules accepted: Orders

## 2020-12-20 ENCOUNTER — Other Ambulatory Visit: Payer: Self-pay

## 2020-12-20 ENCOUNTER — Other Ambulatory Visit (HOSPITAL_COMMUNITY): Payer: Self-pay

## 2020-12-20 ENCOUNTER — Other Ambulatory Visit: Payer: Self-pay | Admitting: Student

## 2020-12-20 DIAGNOSIS — E0800 Diabetes mellitus due to underlying condition with hyperosmolarity without nonketotic hyperglycemic-hyperosmolar coma (NKHHC): Secondary | ICD-10-CM

## 2020-12-20 MED FILL — Chlorthalidone Tab 25 MG: ORAL | 30 days supply | Qty: 30 | Fill #3 | Status: AC

## 2020-12-20 MED FILL — Losartan Potassium Tab 100 MG: ORAL | 30 days supply | Qty: 30 | Fill #3 | Status: AC

## 2020-12-21 ENCOUNTER — Other Ambulatory Visit (HOSPITAL_COMMUNITY): Payer: Self-pay

## 2020-12-21 ENCOUNTER — Other Ambulatory Visit: Payer: Self-pay | Admitting: Student

## 2020-12-21 ENCOUNTER — Other Ambulatory Visit: Payer: Self-pay | Admitting: Internal Medicine

## 2020-12-21 DIAGNOSIS — E0800 Diabetes mellitus due to underlying condition with hyperosmolarity without nonketotic hyperglycemic-hyperosmolar coma (NKHHC): Secondary | ICD-10-CM

## 2020-12-21 DIAGNOSIS — Z794 Long term (current) use of insulin: Secondary | ICD-10-CM

## 2020-12-21 MED ORDER — AMOXICILLIN 500 MG PO CAPS
500.0000 mg | ORAL_CAPSULE | Freq: Four times a day (QID) | ORAL | 0 refills | Status: DC
  Filled 2020-12-21: qty 28, 7d supply, fill #0

## 2020-12-22 ENCOUNTER — Other Ambulatory Visit (HOSPITAL_COMMUNITY): Payer: Self-pay

## 2020-12-22 MED ORDER — LANTUS SOLOSTAR 100 UNIT/ML ~~LOC~~ SOPN
20.0000 [IU] | PEN_INJECTOR | Freq: Two times a day (BID) | SUBCUTANEOUS | 2 refills | Status: DC
  Filled 2020-12-22: qty 12, 30d supply, fill #0
  Filled 2021-01-19: qty 12, 30d supply, fill #1
  Filled 2021-02-06: qty 12, 30d supply, fill #2

## 2020-12-25 ENCOUNTER — Other Ambulatory Visit (HOSPITAL_COMMUNITY): Payer: Self-pay

## 2020-12-25 ENCOUNTER — Other Ambulatory Visit: Payer: Self-pay | Admitting: Internal Medicine

## 2020-12-25 DIAGNOSIS — Z794 Long term (current) use of insulin: Secondary | ICD-10-CM

## 2020-12-25 DIAGNOSIS — E0800 Diabetes mellitus due to underlying condition with hyperosmolarity without nonketotic hyperglycemic-hyperosmolar coma (NKHHC): Secondary | ICD-10-CM

## 2020-12-26 ENCOUNTER — Other Ambulatory Visit (HOSPITAL_COMMUNITY): Payer: Self-pay

## 2020-12-26 ENCOUNTER — Other Ambulatory Visit: Payer: Self-pay | Admitting: Internal Medicine

## 2020-12-26 DIAGNOSIS — E0800 Diabetes mellitus due to underlying condition with hyperosmolarity without nonketotic hyperglycemic-hyperosmolar coma (NKHHC): Secondary | ICD-10-CM

## 2020-12-26 DIAGNOSIS — Z794 Long term (current) use of insulin: Secondary | ICD-10-CM

## 2020-12-27 ENCOUNTER — Other Ambulatory Visit (HOSPITAL_COMMUNITY): Payer: Self-pay

## 2020-12-27 MED ORDER — VICTOZA 18 MG/3ML ~~LOC~~ SOPN
1.8000 mg | PEN_INJECTOR | Freq: Every day | SUBCUTANEOUS | 2 refills | Status: DC
  Filled 2020-12-27 (×2): qty 9, 30d supply, fill #0
  Filled 2021-02-13: qty 9, 30d supply, fill #1

## 2020-12-27 MED ORDER — CANAGLIFLOZIN 100 MG PO TABS
100.0000 mg | ORAL_TABLET | Freq: Every day | ORAL | 2 refills | Status: DC
  Filled 2020-12-27: qty 30, 30d supply, fill #0
  Filled 2021-01-19: qty 30, 30d supply, fill #1

## 2020-12-28 ENCOUNTER — Other Ambulatory Visit (HOSPITAL_COMMUNITY): Payer: Self-pay

## 2020-12-28 MED FILL — Losartan Potassium Tab 100 MG: ORAL | 90 days supply | Qty: 90 | Fill #4 | Status: CN

## 2021-01-19 ENCOUNTER — Other Ambulatory Visit: Payer: Self-pay | Admitting: Student

## 2021-01-19 ENCOUNTER — Other Ambulatory Visit (HOSPITAL_COMMUNITY): Payer: Self-pay

## 2021-01-19 ENCOUNTER — Other Ambulatory Visit: Payer: Self-pay | Admitting: Internal Medicine

## 2021-01-19 ENCOUNTER — Encounter: Payer: Self-pay | Admitting: Internal Medicine

## 2021-01-19 DIAGNOSIS — E0800 Diabetes mellitus due to underlying condition with hyperosmolarity without nonketotic hyperglycemic-hyperosmolar coma (NKHHC): Secondary | ICD-10-CM

## 2021-01-19 DIAGNOSIS — Z794 Long term (current) use of insulin: Secondary | ICD-10-CM

## 2021-01-19 MED ORDER — EMPAGLIFLOZIN 10 MG PO TABS: 10.0000 mg | ORAL_TABLET | Freq: Every day | ORAL | 2 refills | Status: DC

## 2021-01-19 MED ORDER — NOVOLOG FLEXPEN 100 UNIT/ML ~~LOC~~ SOPN
18.0000 [IU] | PEN_INJECTOR | Freq: Three times a day (TID) | SUBCUTANEOUS | 2 refills | Status: DC
Start: 2021-01-19 — End: 2021-02-13
  Filled 2021-01-19: qty 9, 17d supply, fill #0
  Filled 2021-02-13: qty 9, 17d supply, fill #1

## 2021-01-19 MED FILL — Chlorthalidone Tab 25 MG: ORAL | 30 days supply | Qty: 30 | Fill #4 | Status: AC

## 2021-01-19 MED FILL — Losartan Potassium Tab 100 MG: ORAL | 30 days supply | Qty: 30 | Fill #4 | Status: AC

## 2021-01-19 NOTE — Progress Notes (Signed)
Patient now has insurance, and pharmacy asking if we would like to prescribe SGLT2 on formulary. Invokana not covered. Sent prescription for Jardiance. Patient called x 2. No answer, sending my chart message.

## 2021-01-26 ENCOUNTER — Other Ambulatory Visit (HOSPITAL_COMMUNITY): Payer: Self-pay

## 2021-01-29 ENCOUNTER — Other Ambulatory Visit (HOSPITAL_COMMUNITY): Payer: Self-pay

## 2021-01-31 ENCOUNTER — Other Ambulatory Visit (HOSPITAL_COMMUNITY): Payer: Self-pay

## 2021-01-31 ENCOUNTER — Other Ambulatory Visit: Payer: Self-pay

## 2021-01-31 MED ORDER — EMPAGLIFLOZIN 10 MG PO TABS
10.0000 mg | ORAL_TABLET | Freq: Every day | ORAL | 2 refills | Status: DC
  Filled 2021-01-31: qty 30, 30d supply, fill #0

## 2021-01-31 NOTE — Telephone Encounter (Signed)
Call from pt who stated Invokana is not covered by his insurance. Dr Court Joy had sent pt a My Chart message on 9/2 explaining the change from [place] to [place]. Pt stated he did not know about the change in medication. Rx was sent to [place] wants rx sent to [place]. Stated he does not use [facility] - I will remove it form his chart. Thanks

## 2021-01-31 NOTE — Telephone Encounter (Signed)
Error

## 2021-02-06 ENCOUNTER — Other Ambulatory Visit (HOSPITAL_COMMUNITY): Payer: Self-pay

## 2021-02-13 ENCOUNTER — Other Ambulatory Visit (HOSPITAL_COMMUNITY): Payer: Self-pay

## 2021-02-13 ENCOUNTER — Other Ambulatory Visit: Payer: Self-pay | Admitting: Internal Medicine

## 2021-02-13 DIAGNOSIS — Z794 Long term (current) use of insulin: Secondary | ICD-10-CM

## 2021-02-13 DIAGNOSIS — E0800 Diabetes mellitus due to underlying condition with hyperosmolarity without nonketotic hyperglycemic-hyperosmolar coma (NKHHC): Secondary | ICD-10-CM

## 2021-02-13 MED ORDER — INSULIN LISPRO (1 UNIT DIAL) 100 UNIT/ML (KWIKPEN)
18.0000 [IU] | PEN_INJECTOR | Freq: Three times a day (TID) | SUBCUTANEOUS | 0 refills | Status: DC
  Filled 2021-02-13: qty 15, 28d supply, fill #0

## 2021-02-13 MED ORDER — UNIFINE PENTIPS 31G X 5 MM MISC
1 refills | Status: DC
  Filled 2021-02-13: qty 100, 33d supply, fill #0

## 2021-02-13 MED FILL — Losartan Potassium Tab 100 MG: ORAL | 30 days supply | Qty: 30 | Fill #5 | Status: AC

## 2021-02-13 MED FILL — Chlorthalidone Tab 25 MG: ORAL | 30 days supply | Qty: 30 | Fill #5 | Status: AC

## 2021-02-13 NOTE — Assessment & Plan Note (Signed)
Notified by [facility] that pt's insurance does not cover novolog and needs script for humalog. I sent this to the pharmacy. Front desk messaged to arrange an office visit with his PCP next month for routine DM follow up.

## 2021-02-20 ENCOUNTER — Other Ambulatory Visit (HOSPITAL_COMMUNITY): Payer: Self-pay

## 2021-02-26 ENCOUNTER — Other Ambulatory Visit (HOSPITAL_COMMUNITY): Payer: Self-pay

## 2021-02-26 MED ORDER — HYDROCODONE-ACETAMINOPHEN 5-325 MG PO TABS
1.0000 | ORAL_TABLET | Freq: Four times a day (QID) | ORAL | 0 refills | Status: DC
  Filled 2021-02-26: qty 12, 3d supply, fill #0

## 2021-03-01 ENCOUNTER — Other Ambulatory Visit: Payer: Self-pay

## 2021-03-01 ENCOUNTER — Emergency Department (HOSPITAL_COMMUNITY)
Admission: EM | Admit: 2021-03-01 | Discharge: 2021-03-01 | Disposition: A | Discharge status: Home / Self Care | Payer: Medicare Other | Source: Home / Self Care | Attending: Emergency Medicine | Admitting: Emergency Medicine

## 2021-03-01 ENCOUNTER — Emergency Department (HOSPITAL_COMMUNITY): Payer: Medicare Other

## 2021-03-01 DIAGNOSIS — R42 Dizziness and giddiness: Secondary | ICD-10-CM | POA: Insufficient documentation

## 2021-03-01 DIAGNOSIS — Z79899 Other long term (current) drug therapy: Secondary | ICD-10-CM | POA: Insufficient documentation

## 2021-03-01 DIAGNOSIS — Z794 Long term (current) use of insulin: Secondary | ICD-10-CM | POA: Insufficient documentation

## 2021-03-01 DIAGNOSIS — Z7984 Long term (current) use of oral hypoglycemic drugs: Secondary | ICD-10-CM | POA: Insufficient documentation

## 2021-03-01 DIAGNOSIS — R2689 Other abnormalities of gait and mobility: Secondary | ICD-10-CM | POA: Insufficient documentation

## 2021-03-01 DIAGNOSIS — E785 Hyperlipidemia, unspecified: Secondary | ICD-10-CM | POA: Insufficient documentation

## 2021-03-01 DIAGNOSIS — I509 Heart failure, unspecified: Secondary | ICD-10-CM | POA: Insufficient documentation

## 2021-03-01 DIAGNOSIS — E1169 Type 2 diabetes mellitus with other specified complication: Secondary | ICD-10-CM | POA: Insufficient documentation

## 2021-03-01 DIAGNOSIS — J45909 Unspecified asthma, uncomplicated: Secondary | ICD-10-CM | POA: Insufficient documentation

## 2021-03-01 DIAGNOSIS — I110 Hypertensive heart disease with heart failure: Secondary | ICD-10-CM | POA: Insufficient documentation

## 2021-03-01 DIAGNOSIS — I6381 Other cerebral infarction due to occlusion or stenosis of small artery: Secondary | ICD-10-CM | POA: Diagnosis not present

## 2021-03-01 DIAGNOSIS — R0600 Dyspnea, unspecified: Secondary | ICD-10-CM | POA: Diagnosis not present

## 2021-03-01 LAB — BASIC METABOLIC PANEL
Anion gap: 11 (ref 5–15)
BUN: 24 mg/dL — ABNORMAL HIGH (ref 8–23)
CO2: 27 mmol/L (ref 22–32)
Calcium: 9.3 mg/dL (ref 8.9–10.3)
Chloride: 99 mmol/L (ref 98–111)
Creatinine, Ser: 1.19 mg/dL (ref 0.61–1.24)
GFR, Estimated: >60 mL/min (ref >60)
Glucose, Bld: 220 mg/dL — ABNORMAL HIGH (ref 70–99)
Potassium: 3.7 mmol/L (ref 3.5–5.1)
Sodium: 137 mmol/L (ref 135–145)

## 2021-03-01 LAB — URINALYSIS, ROUTINE W REFLEX MICROSCOPIC
Bacteria, UA: NONE SEEN
Bilirubin Urine: NEGATIVE
Glucose, UA: >=500 mg/dL — AB
Hgb urine dipstick: NEGATIVE
Ketones, ur: NEGATIVE mg/dL
Leukocytes,Ua: NEGATIVE
Nitrite: NEGATIVE
Protein, ur: 100 mg/dL — AB
Specific Gravity, Urine: 1.025 (ref 1.005–1.030)
pH: 5 (ref 5.0–8.0)

## 2021-03-01 LAB — APTT: aPTT: 30 seconds (ref 24–36)

## 2021-03-01 LAB — CBC WITH DIFFERENTIAL/PLATELET
Abs Immature Granulocytes: 0.04 10*3/uL (ref 0.00–0.07)
Basophils Absolute: 0 10*3/uL (ref 0.0–0.1)
Basophils Relative: 0 %
Eosinophils Absolute: 0.2 10*3/uL (ref 0.0–0.5)
Eosinophils Relative: 3 %
HCT: 43.9 % (ref 39.0–52.0)
Hemoglobin: 13.9 g/dL (ref 13.0–17.0)
Immature Granulocytes: 1 %
Lymphocytes Relative: 25 %
Lymphs Abs: 2.1 10*3/uL (ref 0.7–4.0)
MCH: 26.2 pg (ref 26.0–34.0)
MCHC: 31.7 g/dL (ref 30.0–36.0)
MCV: 82.8 fL (ref 80.0–100.0)
Monocytes Absolute: 0.6 10*3/uL (ref 0.1–1.0)
Monocytes Relative: 7 %
Neutro Abs: 5.5 10*3/uL (ref 1.7–7.7)
Neutrophils Relative %: 64 %
Platelets: 203 10*3/uL (ref 150–400)
RBC: 5.3 MIL/uL (ref 4.22–5.81)
RDW: 14.5 % (ref 11.5–15.5)
WBC: 8.4 10*3/uL (ref 4.0–10.5)
nRBC: 0 % (ref 0.0–0.2)

## 2021-03-01 LAB — CBG MONITORING, ED
Glucose-Capillary: 190 mg/dL — ABNORMAL HIGH (ref 70–99)
Glucose-Capillary: 207 mg/dL — ABNORMAL HIGH (ref 70–99)
Glucose-Capillary: 163 mg/dL — ABNORMAL HIGH (ref 70–99)

## 2021-03-01 LAB — PROTIME-INR
INR: 1 (ref 0.8–1.2)
Prothrombin Time: 13.3 seconds (ref 11.4–15.2)

## 2021-03-01 LAB — RAPID URINE DRUG SCREEN, HOSP PERFORMED
Amphetamines: NOT DETECTED
Barbiturates: NOT DETECTED
Benzodiazepines: NOT DETECTED
Cocaine: NOT DETECTED
Opiates: POSITIVE — AB
Tetrahydrocannabinol: NOT DETECTED

## 2021-03-01 MED ORDER — LORAZEPAM 2 MG/ML IJ SOLN
1.0000 mg | Freq: Once | INTRAMUSCULAR | Status: AC | PRN
  Administered 2021-03-01: 1 mg via INTRAVENOUS
  Filled 2021-03-01: qty 1

## 2021-03-01 MED ORDER — ONDANSETRON 4 MG PO TBDP
4.0000 mg | ORAL_TABLET | Freq: Once | ORAL | Status: AC
  Administered 2021-03-01: 4 mg via ORAL
  Filled 2021-03-01: qty 1

## 2021-03-01 MED ORDER — MECLIZINE HCL 25 MG PO TABS
50.0000 mg | ORAL_TABLET | Freq: Once | ORAL | Status: AC
  Administered 2021-03-01: 50 mg via ORAL
  Filled 2021-03-01: qty 2

## 2021-03-01 NOTE — ED Provider Notes (Signed)
Emergency Medicine Provider Triage Evaluation Note  Thomas Gardner , a 65 y.o. male  was evaluated in triage.  Pt complains of dizziness characterizes as a spinning room sensation that occurred yesterday.  He states he had a wisdom tooth extracted earlier in the week and was placed on oxycodone which made him feel poorly.  Dizziness is worse with positional head movements.  He is unsteady on his feet.  He denies any chest pain, shortness of breath, focal weakness/numbness, trouble talking, trouble swallowing.  Review of Systems  Positive:  Negative: See above   Physical Exam  BP (!) 164/74 (BP Location: Right Arm)   Pulse 81   Temp 98.7 F (37.1 C) (Oral)   Resp 19   SpO2 98%  Gen:   Awake, no distress   Resp:  Normal effort  MSK:   Moves extremities without difficulty  Other:  Cranial nerves II through XII are intact.  5/5 strength in the upper and lower extremities.  Normal sensation in the upper and lower extremities.  No dysdiadochokinesia on rapid alternating movements.  Normal finger-nose.  Fatigable horizontal nystagmus.  Normal test of skew.  Medical Decision Making  Medically screening exam initiated at 12:57 PM.  Appropriate orders placed.  ZAYYAN MULLEN was informed that the remainder of the evaluation will be completed by another provider, this initial triage assessment does not replace that evaluation, and the importance of remaining in the ED until their evaluation is complete.     Myna Bright Carlin, PA-C 03/01/21 1258    Tegeler, Gwenyth Allegra, MD 03/01/21 [phone]

## 2021-03-01 NOTE — ED Provider Notes (Signed)
[facility] EMERGENCY DEPARTMENT Provider Note   CSN: [id number] Arrival date & time: 03/01/21  1133     History Chief Complaint  Patient presents with   Dizziness    Thomas Gardner is a 65 y.o. male.  Patient with history of hypertension, hyperlipidemia, diabetes presents to the emergency department for evaluation of new onset vertigo.  Patient has a history of frequent headaches but has had a posterior headache over the past few days.  This started shortly after having a wisdom tooth removal performed.  He had been taking hydrocodone and thought that this was making him feel bad.  Vertigo started last evening.  He has had continued headache.  He noted that symptoms persisted overnight despite taking Tylenol.  Today he was very off balance with walking and noted that he was veering off towards the left side.  This was very unusual for him, so he decided to come into the emergency department to get checked. Patient denies signs of stroke including: facial droop, slurred speech, aphasia, weakness/numbness in extremities.  He will have blurry vision at times when his blood sugar gets high but denies any black spots or obvious visual field cuts.  Patient was having difficulty focusing and tracking objects this morning.  No treatments prior to arrival.  Walking and movement makes the symptoms worse.       Past Medical History:  Diagnosis Date   Arthritis    Asthma    CHF (congestive heart failure) ([place])    Diabetes mellitus 2003   GERD (gastroesophageal reflux disease)    Hyperlipidemia    Hypertension    Morbid obesity ([place])    Sleep apnea     Patient Active Problem List   Diagnosis Date Noted   Post-nasal drip 08/18/2020   Fatigue 04/26/2020   Bilateral lower extremity edema 04/26/2020   Epidermal inclusion cyst 04/03/2020   Generalized anxiety disorder 03/27/2020   Neck mass 01/19/2020   Epigastric pain 04/20/2019   Diabetes mellitus due to underlying  condition with hyperosmolarity without coma, with long-term current use of insulin ([place]) 04/19/2019   History of phimosis of penis 04/07/2018   Candidal balanitis 04/07/2018   Rib pain on right side 08/07/2016   Sciatica associated with disorder of lumbosacral spine 08/07/2016   Leg cramping 07/11/2016   Sleep apnea 02/01/2016   Coccyalgia 01/19/2016   Depressed mood 01/19/2016   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   Healthcare maintenance 03/17/2015   Gout 08/08/2014   Hyperlipidemia 06/15/2014   GI bleed 05/03/2014   Congestive heart failure ([place]) 05/03/2014   Diabetes mellitus ([place]) 05/03/2014   Essential hypertension 05/03/2014   Morbid obesity ([place]) 10/10/2011    Past Surgical History:  Procedure Laterality Date   BREATH TEK H PYLORI  11/11/2011   Procedure: BREATH TEK H PYLORI;  Surgeon: Shann Medal, MD;  Location: [facility];  Service: General;  Laterality: N/A;   CARDIAC CATHETERIZATION     ESOPHAGOGASTRODUODENOSCOPY (EGD) WITH PROPOFOL N/A 05/04/2014   Procedure: ESOPHAGOGASTRODUODENOSCOPY (EGD) WITH PROPOFOL;  Surgeon: Cleotis Nipper, MD;  Location: [facility] END[facility]COPY;  Service: Endoscopy;  Laterality: N/A;       Family History  Problem Relation Age of Onset   Cancer Mother        ovarian   Diabetes Mother    Stroke Father    Cancer Maternal Aunt        breast   Diabetes Maternal Aunt    Cancer Maternal Uncle  colon    Social History   Tobacco Use   Smoking status: Never   Smokeless tobacco: Never  Substance Use Topics   Alcohol use: No    Alcohol/week: 0.0 standard drinks   Drug use: No    Home Medications Prior to Admission medications   Medication Sig Start Date End Date Taking? Authorizing Provider  albuterol (PROVENTIL HFA;VENTOLIN HFA) 108 (90 Base) MCG/ACT inhaler Inhale 2 puffs into the lungs every 6 (six) hours as needed for wheezing or shortness of breath. [facility] 03/04/18   Isabelle Course, MD  amoxicillin (AMOXIL) 500 MG  capsule Take 1 capsule (500 mg total) by mouth 4 (four) times daily. 12/19/20     carvedilol (COREG) 25 MG tablet Take 1 tablet (25 mg total) by mouth 2 (two) times daily with a meal. 09/08/20   Mosetta Anis, MD  chlorthalidone (HYGROTON) 25 MG tablet TAKE 1 TABLET (25 MG TOTAL) BY MOUTH DAILY. 06/14/20 06/14/21  Jeralyn Bennett, MD  doxycycline (VIBRAMYCIN) 100 MG capsule TAKE 1 CAPSULE (100 MG TOTAL) BY MOUTH 2 (TWO) TIMES DAILY FOR 7 DAYS. 04/12/20 04/12/21  Riesa Pope, MD  empagliflozin (JARDIANCE) 10 MG TABS tablet Take 1 tablet (10 mg total) by mouth daily. 01/31/21   Jose Persia, MD  glucose monitoring kit (FREESTYLE) monitoring kit 1 each by Does not apply route as needed for other. 04/26/20   Jean Rosenthal, MD  HYDROcodone-acetaminophen (NORCO/VICODIN) 5-325 MG tablet Take 1 tablet by mouth 4 (four) times daily. 02/26/21     insulin glargine (LANTUS SOLOSTAR) 100 UNIT/ML Solostar Pen Inject 20 Units into the skin 2 (two) times daily. 12/22/20   Madalyn Rob, MD  insulin lispro (HUMALOG KWIKPEN) 100 UNIT/ML KwikPen Inject 18 Units into the skin 3 (three) times daily. 02/13/21   Mitzi Hansen, MD  Insulin Pen Needle (UNIFINE PENTIPS) 31G X 5 MM MISC Place at tip of insulin pen and use as directed. 02/13/21   Christian, Rylee, MD  liraglutide (VICTOZA) 18 MG/3ML SOPN Inject 1.8 mg into the skin daily. 12/27/20   Madalyn Rob, MD  losartan (COZAAR) 100 MG tablet TAKE 1 TABLET (100 MG TOTAL) BY MOUTH DAILY. 06/09/20 06/09/21  Jean Rosenthal, MD  metFORMIN (GLUCOPHAGE-XR) 500 MG 24 hr tablet TAKE 1 TABLET (500 MG TOTAL) BY MOUTH DAILY WITH BREAKFAST. 04/26/20 04/26/21  Jean Rosenthal, MD  PROVENTIL HFA 108 (90 Base) MCG/ACT inhaler INHALE 2 PUFFS INTO THE LUNGS EVERY 6 HOURS AS NEEDED FOR WHEEZING ORSHORTNESS OF BREATH. 04/12/19   Katherine Roan, MD  rosuvastatin (CRESTOR) 40 MG tablet TAKE 1 TABLET (40 MG TOTAL) BY MOUTH DAILY. 04/27/20 04/27/21  Jean Rosenthal, MD  sertraline (ZOLOFT) 50 MG  tablet TAKE 1 TABLET (50 MG TOTAL) BY MOUTH DAILY. 03/27/20 03/27/21  Jose Persia, MD  ZETIA 10 MG tablet TAKE 1 TABLET (10MG TOTAL) BY MOUTH DAILY. 12/18/18   Katherine Roan, MD    Allergies    Erythromycin  Review of Systems   Review of Systems  Constitutional:  Negative for fever.  HENT:  Negative for congestion, dental problem, rhinorrhea and sinus pressure.   Eyes:  Negative for photophobia, discharge, redness and visual disturbance.  Respiratory:  Negative for shortness of breath.   Cardiovascular:  Negative for chest pain.  Gastrointestinal:  Negative for nausea and vomiting.  Musculoskeletal:  Negative for gait problem, neck pain and neck stiffness.  Skin:  Negative for rash.  Neurological:  Positive for dizziness and headaches. Negative for  syncope, facial asymmetry, speech difficulty, weakness, light-headedness and numbness.  Psychiatric/Behavioral:  Negative for confusion.    Physical Exam Updated Vital Signs BP 132/76 (BP Location: Right Arm)   Pulse 71   Temp 98.7 F (37.1 C) (Oral)   Resp 18   SpO2 100%   Physical Exam Vitals and nursing note reviewed.  Constitutional:      Appearance: He is well-developed.  HENT:     Head: Normocephalic and atraumatic.     Right Ear: Tympanic membrane, ear canal and external ear normal.     Left Ear: Tympanic membrane, ear canal and external ear normal.     Nose: Nose normal.     Mouth/Throat:     Pharynx: Uvula midline.  Eyes:     General: Lids are normal.     Conjunctiva/sclera: Conjunctivae normal.     Pupils: Pupils are equal, round, and reactive to light.  Cardiovascular:     Rate and Rhythm: Normal rate and regular rhythm.  Pulmonary:     Effort: Pulmonary effort is normal.     Breath sounds: Normal breath sounds.  Abdominal:     Palpations: Abdomen is soft.     Tenderness: There is no abdominal tenderness.  Musculoskeletal:        General: No tenderness. Normal range of motion.     Cervical back: Normal  range of motion and neck supple. No tenderness or bony tenderness.  Skin:    General: Skin is warm and dry.  Neurological:     Mental Status: He is alert and oriented to person, place, and time.     GCS: GCS eye subscore is 4. GCS verbal subscore is 5. GCS motor subscore is 6.     Cranial Nerves: No cranial nerve deficit.     Sensory: No sensory deficit.     Motor: No abnormal muscle tone.     Coordination: Romberg sign positive. Coordination normal. Finger-Nose-Finger Test normal.     Gait: Gait normal.     Deep Tendon Reflexes: Reflexes are normal and symmetric.     Comments: During testing, when patient closes his eyes he tends to slip towards the left side.  He seems uncomfortable with standing, even with eyes open.  I did not have him take any steps due to concern for fall.    ED Results / Procedures / Treatments   Labs (all labs ordered are listed, but only abnormal results are displayed) Labs Reviewed  BASIC METABOLIC PANEL - Abnormal; Notable for the following components:      Result Value   Glucose, Bld 220 (*)    BUN 24 (*)    All other components within normal limits  RAPID URINE DRUG SCREEN, HOSP PERFORMED - Abnormal; Notable for the following components:   Opiates POSITIVE (*)    All other components within normal limits  URINALYSIS, ROUTINE W REFLEX MICROSCOPIC - Abnormal; Notable for the following components:   Glucose, UA >=500 (*)    Protein, ur 100 (*)    All other components within normal limits  CBG MONITORING, ED - Abnormal; Notable for the following components:   Glucose-Capillary 190 (*)    All other components within normal limits  CBG MONITORING, ED - Abnormal; Notable for the following components:   Glucose-Capillary 207 (*)    All other components within normal limits  CBG MONITORING, ED - Abnormal; Notable for the following components:   Glucose-Capillary 163 (*)    All other components within normal limits  CBC WITH DIFFERENTIAL/PLATELET   PROTIME-INR  APTT    EKG EKG Interpretation  Date/Time:  Thursday March 01 2021 12:20:17 EDT Ventricular Rate:  81 PR Interval:  160 QRS Duration: 124 QT Interval:  390 QTC Calculation: 453 R Axis:   -37 Text Interpretation: Sinus rhythm with occasional Premature ventricular complexes Left axis deviation Left ventricular hypertrophy with QRS widening ( R in aVL , Cornell product , Romhilt-Estes ) Confirmed by Lennice Sites ([id number]) on 03/01/2021 7:34:31 PM  Radiology CT HEAD WO CONTRAST (5MM)  Result Date: 03/01/2021 CLINICAL DATA:  Headache, intracranial hemorrhage suspected. Vertigo. Central posterior headache. New onset balance problems. EXAM: CT HEAD WITHOUT CONTRAST TECHNIQUE: Contiguous axial images were obtained from the base of the skull through the vertex without intravenous contrast. COMPARISON:  None. FINDINGS: Brain: No evidence of acute infarction, hemorrhage, hydrocephalus, extra-axial collection or mass lesion/mass effect. Mild diffuse cerebral atrophy. Scattered low-attenuation changes in the deep white matter consistent with small vessel ischemia. Vascular: Moderate intracranial arterial vascular calcifications. Skull: Calvarium appears intact. Sinuses/Orbits: Opacification of the left maxillary antrum with mucosal thickening in the right maxillary antrum. No acute air-fluid levels. Mastoid air cells are clear. Periapical lucencies at the right third and left second maxillary molars consistent with periodontal disease. No visible soft tissue swelling or collection. Other: None. IMPRESSION: No acute intracranial abnormalities. Mild chronic cerebral atrophy and small vessel ischemic changes. Electronically Signed   By: Lucienne Capers M.D.   On: 03/01/2021 18:48    Procedures Procedures   Medications Ordered in ED Medications  ondansetron (ZOFRAN-ODT) disintegrating tablet 4 mg (4 mg Oral Given 03/01/21 1316)  meclizine (ANTIVERT) tablet 50 mg (50 mg Oral Given 03/01/21  1316)  LORazepam (ATIVAN) injection 1 mg (1 mg Intravenous Given 03/01/21 2125)    ED Course  I have reviewed the triage vital signs and the nursing notes.  Pertinent labs & imaging results that were available during my care of the patient were reviewed by me and considered in my medical decision making (see chart for details).  Patient seen and examined in hallway.  Symptoms concerning for CVA.  Given headaches, will start with noncontrast head CT.  If negative, will need MRI.  Vital signs reviewed and are as follows: BP 132/76 (BP Location: Right Arm)   Pulse 71   Temp 98.7 F (37.1 C) (Oral)   Resp 18   SpO2 100%   Patient discussed with and seen by Dr. Ronnald Nian.   7:35 PM patient rechecked, updated on results to this point including negative head CT.  He is in agreement to proceed with MRI.  Ativan ordered for claustrophobia.  States that he feels a little bit less lightheaded now.  10:57 PM MRI returned negative.  Patient updated.  He continues to progressively feel better.  He has been up with mild imbalance.  Patient lives alone but does have friends that he can stay with tonight and call for a ride home.  He is agreeable.  We will continue meclizine.  Encourage PCP follow-up.  Discussed that he should avoid hydrocodone if possible as it is unclear if this is affecting his symptoms.  Patient counseled to return if they have weakness in their arms or legs, slurred speech, trouble walking or talking, confusion, trouble with their balance, or if they have any other concerns. Patient verbalizes understanding and agrees with plan.     MDM Rules/Calculators/A&P  Patient with imbalance and vertigo symptoms with risk factors for stroke.  Patient was evaluated with CT imaging as well as MRI of the brain.  Fortunately these were negative for acute findings.  Work-up otherwise reassuring.  Patient has been progressively improving after administration of meclizine  here as well as Ativan given for MRI.  Patient feels comfortable discharged home.  He has friends to stay with tonight.  Discussed signs and symptoms which should cause him to return to the emergency department.  Final Clinical Impression(s) / ED Diagnoses Final diagnoses:  Vertigo  Imbalance    Rx / DC Orders ED Discharge Orders     None        Carlisle Cater, PA-C 03/01/21 [place], [place], DO 03/01/21 2302

## 2021-03-01 NOTE — ED Notes (Signed)
Patient transported to MRI. Undresesed and all belongings went with pt to MRI. Pt medicated prior to departure. Will continue to monitor.

## 2021-03-01 NOTE — Discharge Instructions (Addendum)
Please read and follow all provided instructions.  Your diagnoses today include:  1. Vertigo   2. Imbalance    Tests performed today include: Complete blood cell count: No concerning results Basic metabolic panel: Mildly elevated blood sugar but no other problems Urinalysis (urine test): no infection CT/MRI of your brain: Did not show any acute problems or signs of stroke tonight Vital signs. See below for your results today.   Medications prescribed:  Meclizine - medication to help control vertigo  Take any prescribed medications only as directed.  Home care instructions:  Follow any educational materials contained in this packet.  Follow-up instructions: Please follow-up with your primary care provider in the next 3 days for further evaluation of your symptoms.   Return instructions:  Please return to the Emergency Department if you experience worsening symptoms.  Return if you have weakness in your arms or legs, slurred speech, worsening trouble walking or trouble talking, confusion, or trouble with your balance.  Please return if you have any other emergent concerns.  Additional Information:  Your vital signs today were: BP (!) 155/77   Pulse 75   Temp 98.7 F (37.1 C) (Oral)   Resp 20   SpO2 97%  If your blood pressure (BP) was elevated above 135/85 this visit, please have this repeated by your doctor within one month. --------------

## 2021-03-01 NOTE — ED Triage Notes (Signed)
Pt reports wisdom tooth removal on Monday. Taking oxycodone since then, although did stop yesterday because he thought it was making him feel bad. Reports constant dizziness since the middle of the night last night, with one brief episode yesterday, severe headache since yesterday. Episodes make it difficult for him to walk.

## 2021-03-01 NOTE — ED Notes (Signed)
Pt back from MRI, states it went really well with the help of the Ativan. Pt resting comfortably at this time. No acute changes noted. Will continue to monitor.

## 2021-03-01 NOTE — ED Provider Notes (Signed)
I personally evaluated the patient during the encounter and completed a history, physical, procedures, medical decision making to contribute to the overall care of the patient and decision making for the patient briefly, the patient is a 65 y.o. male is here with dizziness.  Started around 4 AM.  Having some issues with balance.  Denies any weakness, numbness, speech changes, vision changes.  He is unsteady on his feet and unable to test ambulation.  He does appear to have some issues with finger-to-nose finger with his left upper extremity.  He was given meclizine and fluids prior to my evaluation and he feels like his symptoms are getting better but still not back to his normal.  He does have a history of diabetes, hypertension, high cholesterol.  CT head unremarkable.  Lab work unremarkable.  Will obtain MRI to rule out stroke.  If patient has stroke will admit if he does not, we will reevaluate anticipate possible discharge.  Please see my PAs note for further result, evaluation, disposition.   EKG Interpretation  Date/Time:  Thursday March 01 2021 12:20:17 EDT Ventricular Rate:  81 PR Interval:  160 QRS Duration: 124 QT Interval:  390 QTC Calculation: 453 R Axis:   -37 Text Interpretation: Sinus rhythm with occasional Premature ventricular complexes Left axis deviation Left ventricular hypertrophy with QRS widening ( R in aVL , Cornell product , Romhilt-Estes ) Confirmed by Lennice Sites ([id number]) on 03/01/2021 7:34:31 PM            Lennice Sites, DO 03/01/21 1934

## 2021-03-01 NOTE — ED Notes (Signed)
Pt updated on MRI and receiving medications for his claustrophobia when MRI calls for him. Pt denies any complaints at this time. Pt given ice water from bedside table, no difficulty swallowing noted. No acute changes noted. Will continue to monitor.

## 2021-03-02 ENCOUNTER — Inpatient Hospital Stay (HOSPITAL_BASED_OUTPATIENT_CLINIC_OR_DEPARTMENT_OTHER)
Admission: EM | Admit: 2021-03-02 | Discharge: 2021-03-07 | DRG: 065 | Disposition: A | Discharge status: Rehabilitation Facility | Payer: Medicare Other | Attending: Internal Medicine | Admitting: Internal Medicine

## 2021-03-02 ENCOUNTER — Emergency Department (HOSPITAL_BASED_OUTPATIENT_CLINIC_OR_DEPARTMENT_OTHER): Payer: Medicare Other

## 2021-03-02 ENCOUNTER — Other Ambulatory Visit: Payer: Self-pay

## 2021-03-02 ENCOUNTER — Encounter (HOSPITAL_BASED_OUTPATIENT_CLINIC_OR_DEPARTMENT_OTHER): Payer: Self-pay | Admitting: *Deleted

## 2021-03-02 ENCOUNTER — Encounter: Payer: Self-pay | Admitting: *Deleted

## 2021-03-02 DIAGNOSIS — E785 Hyperlipidemia, unspecified: Secondary | ICD-10-CM

## 2021-03-02 DIAGNOSIS — I639 Cerebral infarction, unspecified: Principal | ICD-10-CM

## 2021-03-02 DIAGNOSIS — E041 Nontoxic single thyroid nodule: Secondary | ICD-10-CM

## 2021-03-02 DIAGNOSIS — I6381 Other cerebral infarction due to occlusion or stenosis of small artery: Principal | ICD-10-CM | POA: Diagnosis present

## 2021-03-02 DIAGNOSIS — Z9114 Patient's other noncompliance with medication regimen: Secondary | ICD-10-CM

## 2021-03-02 DIAGNOSIS — I63012 Cerebral infarction due to thrombosis of left vertebral artery: Secondary | ICD-10-CM

## 2021-03-02 DIAGNOSIS — J45909 Unspecified asthma, uncomplicated: Secondary | ICD-10-CM | POA: Diagnosis present

## 2021-03-02 DIAGNOSIS — I110 Hypertensive heart disease with heart failure: Secondary | ICD-10-CM | POA: Diagnosis present

## 2021-03-02 DIAGNOSIS — I10 Essential (primary) hypertension: Secondary | ICD-10-CM | POA: Diagnosis present

## 2021-03-02 DIAGNOSIS — Z87438 Personal history of other diseases of male genital organs: Secondary | ICD-10-CM | POA: Diagnosis present

## 2021-03-02 DIAGNOSIS — R0600 Dyspnea, unspecified: Secondary | ICD-10-CM

## 2021-03-02 DIAGNOSIS — Z823 Family history of stroke: Secondary | ICD-10-CM

## 2021-03-02 DIAGNOSIS — Z7984 Long term (current) use of oral hypoglycemic drugs: Secondary | ICD-10-CM

## 2021-03-02 DIAGNOSIS — R471 Dysarthria and anarthria: Secondary | ICD-10-CM | POA: Diagnosis present

## 2021-03-02 DIAGNOSIS — M1990 Unspecified osteoarthritis, unspecified site: Secondary | ICD-10-CM | POA: Diagnosis present

## 2021-03-02 DIAGNOSIS — Z6841 Body Mass Index (BMI) 40.0 and over, adult: Secondary | ICD-10-CM

## 2021-03-02 DIAGNOSIS — R451 Restlessness and agitation: Secondary | ICD-10-CM | POA: Diagnosis not present

## 2021-03-02 DIAGNOSIS — Z833 Family history of diabetes mellitus: Secondary | ICD-10-CM

## 2021-03-02 DIAGNOSIS — I509 Heart failure, unspecified: Secondary | ICD-10-CM

## 2021-03-02 DIAGNOSIS — E876 Hypokalemia: Secondary | ICD-10-CM

## 2021-03-02 DIAGNOSIS — G4733 Obstructive sleep apnea (adult) (pediatric): Secondary | ICD-10-CM | POA: Diagnosis present

## 2021-03-02 DIAGNOSIS — Z809 Family history of malignant neoplasm, unspecified: Secondary | ICD-10-CM

## 2021-03-02 DIAGNOSIS — E119 Type 2 diabetes mellitus without complications: Secondary | ICD-10-CM

## 2021-03-02 DIAGNOSIS — I5022 Chronic systolic (congestive) heart failure: Secondary | ICD-10-CM

## 2021-03-02 DIAGNOSIS — E7800 Pure hypercholesterolemia, unspecified: Secondary | ICD-10-CM | POA: Diagnosis present

## 2021-03-02 DIAGNOSIS — Z794 Long term (current) use of insulin: Secondary | ICD-10-CM

## 2021-03-02 DIAGNOSIS — R251 Tremor, unspecified: Secondary | ICD-10-CM | POA: Diagnosis present

## 2021-03-02 DIAGNOSIS — R29700 NIHSS score 0: Secondary | ICD-10-CM | POA: Diagnosis present

## 2021-03-02 DIAGNOSIS — Z20822 Contact with and (suspected) exposure to covid-19: Secondary | ICD-10-CM | POA: Diagnosis present

## 2021-03-02 DIAGNOSIS — Z79899 Other long term (current) drug therapy: Secondary | ICD-10-CM

## 2021-03-02 DIAGNOSIS — Z881 Allergy status to other antibiotic agents status: Secondary | ICD-10-CM

## 2021-03-02 DIAGNOSIS — E1165 Type 2 diabetes mellitus with hyperglycemia: Secondary | ICD-10-CM | POA: Diagnosis not present

## 2021-03-02 LAB — CBC WITH DIFFERENTIAL/PLATELET
Abs Immature Granulocytes: 0.05 10*3/uL (ref 0.00–0.07)
Basophils Absolute: 0.1 10*3/uL (ref 0.0–0.1)
Basophils Relative: 1 %
Eosinophils Absolute: 0.1 10*3/uL (ref 0.0–0.5)
Eosinophils Relative: 1 %
HCT: 44.3 % (ref 39.0–52.0)
Hemoglobin: 14.4 g/dL (ref 13.0–17.0)
Immature Granulocytes: 1 %
Lymphocytes Relative: 18 %
Lymphs Abs: 1.9 10*3/uL (ref 0.7–4.0)
MCH: 26.3 pg (ref 26.0–34.0)
MCHC: 32.5 g/dL (ref 30.0–36.0)
MCV: 80.8 fL (ref 80.0–100.0)
Monocytes Absolute: 0.7 10*3/uL (ref 0.1–1.0)
Monocytes Relative: 7 %
Neutro Abs: 7.3 10*3/uL (ref 1.7–7.7)
Neutrophils Relative %: 72 %
Platelets: 222 10*3/uL (ref 150–400)
RBC: 5.48 MIL/uL (ref 4.22–5.81)
RDW: 14.7 % (ref 11.5–15.5)
WBC: 10.1 10*3/uL (ref 4.0–10.5)
nRBC: 0 % (ref 0.0–0.2)

## 2021-03-02 LAB — BASIC METABOLIC PANEL
Anion gap: 9 (ref 5–15)
BUN: 27 mg/dL — ABNORMAL HIGH (ref 8–23)
CO2: 28 mmol/L (ref 22–32)
Calcium: 9.6 mg/dL (ref 8.9–10.3)
Chloride: 101 mmol/L (ref 98–111)
Creatinine, Ser: 1.2 mg/dL (ref 0.61–1.24)
GFR, Estimated: >60 mL/min (ref >60)
Glucose, Bld: 266 mg/dL — ABNORMAL HIGH (ref 70–99)
Potassium: 3.7 mmol/L (ref 3.5–5.1)
Sodium: 138 mmol/L (ref 135–145)

## 2021-03-02 LAB — RESP PANEL BY RT-PCR (FLU A&B, COVID) ARPGX2
Influenza A by PCR: NEGATIVE
Influenza B by PCR: NEGATIVE
SARS Coronavirus 2 by RT PCR: NEGATIVE

## 2021-03-02 MED ORDER — DIAZEPAM 5 MG/ML IJ SOLN
2.5000 mg | Freq: Once | INTRAMUSCULAR | Status: AC
  Administered 2021-03-02: 2.5 mg via INTRAVENOUS
  Filled 2021-03-02: qty 2

## 2021-03-02 MED ORDER — MECLIZINE HCL 25 MG PO TABS
25.0000 mg | ORAL_TABLET | Freq: Once | ORAL | Status: AC
  Administered 2021-03-02: 25 mg via ORAL
  Filled 2021-03-02: qty 1

## 2021-03-02 MED ORDER — ONDANSETRON HCL 4 MG/2ML IJ SOLN
4.0000 mg | Freq: Once | INTRAMUSCULAR | Status: AC
  Administered 2021-03-02: 4 mg via INTRAVENOUS
  Filled 2021-03-02: qty 2

## 2021-03-02 MED ORDER — IOHEXOL 350 MG/ML SOLN
100.0000 mL | Freq: Once | INTRAVENOUS | Status: AC | PRN
  Administered 2021-03-02: 100 mL via INTRAVENOUS

## 2021-03-02 MED ORDER — SODIUM CHLORIDE 0.9 % IV BOLUS
1000.0000 mL | Freq: Once | INTRAVENOUS | Status: AC
  Administered 2021-03-02: 1000 mL via INTRAVENOUS

## 2021-03-02 NOTE — ED Triage Notes (Signed)
He was seen at [facility] yesterday for dizziness. He was diagnosed with vertigo. His CT scan was negative. He has been vomiting today.

## 2021-03-02 NOTE — Progress Notes (Unsigned)
.  awv

## 2021-03-02 NOTE — ED Provider Notes (Signed)
[facility] EMERGENCY DEPARTMENT Provider Note   CSN: [id number] Arrival date & time: 03/02/21  1632     History Chief Complaint  Patient presents with   Dizziness    Thomas Gardner is a 65 y.o. male.  The history is provided by the patient.  Dizziness Quality:  Imbalance Severity:  Moderate Onset quality:  Gradual Duration:  2 days Timing:  Intermittent Progression:  Waxing and waning Chronicity:  Recurrent Context: head movement   Relieved by:  Being still (meclizine helped some yesterday) Worsened by:  Movement Associated symptoms: no blood in stool, no chest pain, no palpitations, no shortness of breath and no vomiting   Risk factors: no hx of vertigo       Past Medical History:  Diagnosis Date   Arthritis    Asthma    CHF (congestive heart failure) ([place])    Diabetes mellitus 2003   GERD (gastroesophageal reflux disease)    Hyperlipidemia    Hypertension    Morbid obesity ([place])    Sleep apnea     Patient Active Problem List   Diagnosis Date Noted   Post-nasal drip 08/18/2020   Fatigue 04/26/2020   Bilateral lower extremity edema 04/26/2020   Epidermal inclusion cyst 04/03/2020   Generalized anxiety disorder 03/27/2020   Neck mass 01/19/2020   Epigastric pain 04/20/2019   Diabetes mellitus due to underlying condition with hyperosmolarity without coma, with long-term current use of insulin ([place]) 04/19/2019   History of phimosis of penis 04/07/2018   Candidal balanitis 04/07/2018   Rib pain on right side 08/07/2016   Sciatica associated with disorder of lumbosacral spine 08/07/2016   Leg cramping 07/11/2016   Sleep apnea 02/01/2016   Coccyalgia 01/19/2016   Depressed mood 01/19/2016   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   Healthcare maintenance 03/17/2015   Gout 08/08/2014   Hyperlipidemia 06/15/2014   GI bleed 05/03/2014   Congestive heart failure ([place]) 05/03/2014   Diabetes mellitus ([place]) 05/03/2014   Essential hypertension  05/03/2014   Morbid obesity ([place]) 10/10/2011    Past Surgical History:  Procedure Laterality Date   BREATH TEK H PYLORI  11/11/2011   Procedure: BREATH TEK H PYLORI;  Surgeon: Shann Medal, MD;  Location: [facility];  Service: General;  Laterality: N/A;   CARDIAC CATHETERIZATION     ESOPHAGOGASTRODUODENOSCOPY (EGD) WITH PROPOFOL N/A 05/04/2014   Procedure: ESOPHAGOGASTRODUODENOSCOPY (EGD) WITH PROPOFOL;  Surgeon: Cleotis Nipper, MD;  Location[facility] END[facility]COPY;  Service: Endoscopy;  Laterality: N/A;       Family History  Problem Relation Age of Onset   Cancer Mother        ovarian   Diabetes Mother    Stroke Father    Cancer Maternal Aunt        breast   Diabetes Maternal Aunt    Cancer Maternal Uncle        colon    Social History   Tobacco Use   Smoking status: Never   Smokeless tobacco: Never  Vaping Use   Vaping Use: Never used  Substance Use Topics   Alcohol use: No    Alcohol/week: 0.0 standard drinks   Drug use: No    Home Medications Prior to Admission medications   Medication Sig Start Date End Date Taking? Authorizing Provider  albuterol (PROVENTIL HFA;VENTOLIN HFA) 108 (90 Base) MCG/ACT inhaler Inhale 2 puffs into the lungs every 6 (six) hours as needed for wheezing or shortness of breath. [facility] 03/04/18   Francesco Runner  S, MD  amoxicillin (AMOXIL) 500 MG capsule Take 1 capsule (500 mg total) by mouth 4 (four) times daily. Patient not taking: No sig reported 12/19/20     carvedilol (COREG) 25 MG tablet Take 1 tablet (25 mg total) by mouth 2 (two) times daily with a meal. 09/08/20   Mosetta Anis, MD  chlorthalidone (HYGROTON) 25 MG tablet TAKE 1 TABLET (25 MG TOTAL) BY MOUTH DAILY. Patient taking differently: Take 25 mg by mouth daily. 06/14/20 06/14/21  Jeralyn Bennett, MD  cholecalciferol (VITAMIN D3) 25 MCG (1000 UNIT) tablet Take 1,000 Units by mouth daily.    [provider]  doxycycline (VIBRAMYCIN) 100 MG capsule TAKE 1 CAPSULE (100 MG  TOTAL) BY MOUTH 2 (TWO) TIMES DAILY FOR 7 DAYS. Patient not taking: No sig reported 04/12/20 04/12/21  Riesa Pope, MD  empagliflozin (JARDIANCE) 10 MG TABS tablet Take 1 tablet (10 mg total) by mouth daily. 01/31/21   Jose Persia, MD  glucose monitoring kit (FREESTYLE) monitoring kit 1 each by Does not apply route as needed for other. 04/26/20   Jean Rosenthal, MD  HYDROcodone-acetaminophen (NORCO/VICODIN) 5-325 MG tablet Take 1 tablet by mouth 4 (four) times daily. 02/26/21     insulin glargine (LANTUS SOLOSTAR) 100 UNIT/ML Solostar Pen Inject 20 Units into the skin 2 (two) times daily. 12/22/20   Madalyn Rob, MD  insulin lispro (HUMALOG KWIKPEN) 100 UNIT/ML KwikPen Inject 18 Units into the skin 3 (three) times daily. 02/13/21   Mitzi Hansen, MD  Insulin Pen Needle (UNIFINE PENTIPS) 31G X 5 MM MISC Place at tip of insulin pen and use as directed. 02/13/21   Christian, Rylee, MD  liraglutide (VICTOZA) 18 MG/3ML SOPN Inject 1.8 mg into the skin daily. 12/27/20   Madalyn Rob, MD  losartan (COZAAR) 100 MG tablet TAKE 1 TABLET (100 MG TOTAL) BY MOUTH DAILY. Patient taking differently: Take 100 mg by mouth daily. 06/09/20 06/09/21  Jean Rosenthal, MD  metFORMIN (GLUCOPHAGE-XR) 500 MG 24 hr tablet TAKE 1 TABLET (500 MG TOTAL) BY MOUTH DAILY WITH BREAKFAST. Patient not taking: No sig reported 04/26/20 04/26/21  Jean Rosenthal, MD  PROVENTIL HFA 108 (90 Base) MCG/ACT inhaler INHALE 2 PUFFS INTO THE LUNGS EVERY 6 HOURS AS NEEDED FOR WHEEZING ORSHORTNESS OF BREATH. Patient taking differently: Inhale 2 puffs into the lungs every 6 (six) hours as needed for shortness of breath or wheezing. 04/12/19   Katherine Roan, MD  rosuvastatin (CRESTOR) 40 MG tablet TAKE 1 TABLET (40 MG TOTAL) BY MOUTH DAILY. Patient taking differently: Take 40 mg by mouth daily. 04/27/20 04/27/21  Jean Rosenthal, MD  sertraline (ZOLOFT) 50 MG tablet TAKE 1 TABLET (50 MG TOTAL) BY MOUTH DAILY. Patient taking differently: Take 50  mg by mouth daily. 03/27/20 03/27/21  Jose Persia, MD  vitamin C (ASCORBIC ACID) 500 MG tablet Take 500 mg by mouth daily.    [provider]  ZETIA 10 MG tablet TAKE 1 TABLET (10MG TOTAL) BY MOUTH DAILY. Patient taking differently: Take 10 mg by mouth daily. 12/18/18   Katherine Roan, MD    Allergies    Erythromycin  Review of Systems   Review of Systems  Constitutional:  Negative for chills and fever.  HENT:  Negative for ear pain and sore throat.   Eyes:  Negative for pain and visual disturbance.  Respiratory:  Negative for cough and shortness of breath.   Cardiovascular:  Negative for chest pain and palpitations.  Gastrointestinal:  Negative for  abdominal pain, blood in stool and vomiting.  Genitourinary:  Negative for dysuria and hematuria.  Musculoskeletal:  Negative for arthralgias and back pain.  Skin:  Negative for color change and rash.  Neurological:  Positive for dizziness. Negative for seizures and syncope.  All other systems reviewed and are negative.  Physical Exam Updated Vital Signs BP (!) 169/78 (BP Location: Left Arm)   Pulse 77   Temp 98.3 F (36.8 C) (Oral)   Resp 16   Ht '5\' 6"'  (1.676 m)   Wt 113.2 kg   SpO2 95%   BMI 40.28 kg/m   Physical Exam Vitals and nursing note reviewed.  Constitutional:      General: He is not in acute distress.    Appearance: He is well-developed. He is not ill-appearing.  HENT:     Head: Normocephalic and atraumatic.     Nose: Nose normal.     Mouth/Throat:     Mouth: Mucous membranes are moist.  Eyes:     Extraocular Movements: Extraocular movements intact.     Conjunctiva/sclera: Conjunctivae normal.     Pupils: Pupils are equal, round, and reactive to light.  Cardiovascular:     Rate and Rhythm: Normal rate and regular rhythm.     Heart sounds: No murmur heard. Pulmonary:     Effort: Pulmonary effort is normal. No respiratory distress.     Breath sounds: Normal breath sounds.  Abdominal:      Palpations: Abdomen is soft.     Tenderness: There is no abdominal tenderness.  Musculoskeletal:        General: Normal range of motion.     Cervical back: Normal range of motion and neck supple.  Skin:    General: Skin is warm and dry.     Capillary Refill: Capillary refill takes less than 2 seconds.  Neurological:     General: No focal deficit present.     Mental Status: He is alert and oriented to person, place, and time.     Cranial Nerves: No cranial nerve deficit.     Sensory: No sensory deficit.     Motor: No weakness.     Coordination: Coordination normal.     Comments: 5+ out of 5 strength throughout, normal sensation, some difficulty with finger-nose-finger on the left, unsteady gait, no obvious nystagmus  Psychiatric:        Mood and Affect: Mood normal.    ED Results / Procedures / Treatments   Labs (all labs ordered are listed, but only abnormal results are displayed) Labs Reviewed  BASIC METABOLIC PANEL - Abnormal; Notable for the following components:      Result Value   Glucose, Bld 266 (*)    BUN 27 (*)    All other components within normal limits  RESP PANEL BY RT-PCR (FLU A&B, COVID) ARPGX2  CBC WITH DIFFERENTIAL/PLATELET    EKG EKG Interpretation  Date/Time:  Friday March 02 2021 18:03:49 EDT Ventricular Rate:  78 PR Interval:  164 QRS Duration: 116 QT Interval:  394 QTC Calculation: 449 R Axis:   -35 Text Interpretation: Normal sinus rhythm Left axis deviation Pulmonary disease pattern Left ventricular hypertrophy with QRS widening ( R in aVL , Cornell product , Romhilt-Estes ) Abnormal ECG Confirmed by Lennice Sites ([id number]) on 03/02/2021 6:14:25 PM  Radiology CT Angio Head W or Wo Contrast  Result Date: 03/02/2021 CLINICAL DATA:  Vertigo EXAM: CT ANGIOGRAPHY HEAD AND NECK TECHNIQUE: Multidetector CT imaging of the head and neck was performed  using the standard protocol during bolus administration of intravenous contrast. Multiplanar CT image  reconstructions and MIPs were obtained to evaluate the vascular anatomy. Carotid stenosis measurements (when applicable) are obtained utilizing NASCET criteria, using the distal internal carotid diameter as the denominator. CONTRAST:  123m OMNIPAQUE IOHEXOL 350 MG/ML SOLN COMPARISON:  None. FINDINGS: CT HEAD FINDINGS Brain: There is no mass, hemorrhage or extra-axial collection. The size and configuration of the ventricles and extra-axial CSF spaces are normal. There is hypoattenuation of the periventricular white matter, most commonly indicating chronic ischemic microangiopathy. Skull: The visualized skull base, calvarium and extracranial soft tissues are normal. Sinuses/Orbits: No fluid levels or advanced mucosal thickening of the visualized paranasal sinuses. No mastoid or middle ear effusion. The orbits are normal. CTA NECK FINDINGS SKELETON: There is no bony spinal canal stenosis. No lytic or blastic lesion. OTHER NECK: Normal pharynx, larynx and major salivary glands. No cervical lymphadenopathy. 3.6 cm heterogeneous nodule in the right lobe of the thyroid gland. UPPER CHEST: No pneumothorax or pleural effusion. No nodules or masses. AORTIC ARCH: There is calcific atherosclerosis of the aortic arch. There is no aneurysm, dissection or hemodynamically significant stenosis of the visualized portion of the aorta. Conventional 3 vessel aortic branching pattern. The visualized proximal subclavian arteries are widely patent. RIGHT CAROTID SYSTEM: No dissection, occlusion or aneurysm. Mild atherosclerotic calcification at the carotid bifurcation without hemodynamically significant stenosis. LEFT CAROTID SYSTEM: There is mild atherosclerotic calcification at the carotid bifurcation with severe stenosis at the proximal external carotid artery. No internal carotid artery stenosis. VERTEBRAL ARTERIES: Right dominant configuration. Both origins are clearly patent. There is no dissection, occlusion or flow-limiting  stenosis to the skull base (V1-V3 segments). CTA HEAD FINDINGS POSTERIOR CIRCULATION: --Vertebral arteries: Diminutive left V4 segment becomes non-opacified before reaching the vertebrobasilar confluence --Inferior cerebellar arteries: Poor visualization of left PICA. Right is normal. --Basilar artery: Normal. --Superior cerebellar arteries: Normal. --Posterior cerebral arteries (PCA): Multifocal moderate-to-severe stenosis of the right P2-P3 segments. ANTERIOR CIRCULATION: --Intracranial internal carotid arteries: Normal. --Anterior cerebral arteries (ACA): Normal. Both A1 segments are present. Patent anterior communicating artery (a-comm). --Middle cerebral arteries (MCA): Normal. VENOUS SINUSES: As permitted by contrast timing, patent. ANATOMIC VARIANTS: None Review of the MIP images confirms the above findings. IMPRESSION: 1. No emergent large vessel occlusion. 2. 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 3. Severe stenosis of the proximal left external carotid artery. 4. 3.6 cm incidental right thyroid nodule. Recommend thyroid [place] Reference: J Am Coll Radiol. 2015 Feb;12(2): 143-50 Electronically Signed   By: KUlyses JarredM.D.   On: 03/02/2021 23:15   CT HEAD WO CONTRAST (5MM)  Result Date: 03/01/2021 CLINICAL DATA:  Headache, intracranial hemorrhage suspected. Vertigo. Central posterior headache. New onset balance problems. EXAM: CT HEAD WITHOUT CONTRAST TECHNIQUE: Contiguous axial images were obtained from the base of the skull through the vertex without intravenous contrast. COMPARISON:  None. FINDINGS: Brain: No evidence of acute infarction, hemorrhage, hydrocephalus, extra-axial collection or mass lesion/mass effect. Mild diffuse cerebral atrophy. Scattered low-attenuation changes in the deep white matter consistent with small vessel ischemia. Vascular: Moderate intracranial arterial vascular calcifications. Skull: Calvarium appears intact.  Sinuses/Orbits: Opacification of the left maxillary antrum with mucosal thickening in the right maxillary antrum. No acute air-fluid levels. Mastoid air cells are clear. Periapical lucencies at the right third and left second maxillary molars consistent with periodontal disease. No visible soft tissue swelling or collection. Other: None. IMPRESSION: No acute intracranial abnormalities. Mild chronic cerebral atrophy and small  vessel ischemic changes. Electronically Signed   By: Lucienne Capers M.D.   On: 03/01/2021 18:48   CT Angio Neck W and/or Wo Contrast  Result Date: 03/02/2021 CLINICAL DATA:  Vertigo EXAM: CT ANGIOGRAPHY HEAD AND NECK TECHNIQUE: Multidetector CT imaging of the head and neck was performed using the standard protocol during bolus administration of intravenous contrast. Multiplanar CT image reconstructions and MIPs were obtained to evaluate the vascular anatomy. Carotid stenosis measurements (when applicable) are obtained utilizing NASCET criteria, using the distal internal carotid diameter as the denominator. CONTRAST:  11m OMNIPAQUE IOHEXOL 350 MG/ML SOLN COMPARISON:  None. FINDINGS: CT HEAD FINDINGS Brain: There is no mass, hemorrhage or extra-axial collection. The size and configuration of the ventricles and extra-axial CSF spaces are normal. There is hypoattenuation of the periventricular white matter, most commonly indicating chronic ischemic microangiopathy. Skull: The visualized skull base, calvarium and extracranial soft tissues are normal. Sinuses/Orbits: No fluid levels or advanced mucosal thickening of the visualized paranasal sinuses. No mastoid or middle ear effusion. The orbits are normal. CTA NECK FINDINGS SKELETON: There is no bony spinal canal stenosis. No lytic or blastic lesion. OTHER NECK: Normal pharynx, larynx and major salivary glands. No cervical lymphadenopathy. 3.6 cm heterogeneous nodule in the right lobe of the thyroid gland. UPPER CHEST: No pneumothorax or  pleural effusion. No nodules or masses. AORTIC ARCH: There is calcific atherosclerosis of the aortic arch. There is no aneurysm, dissection or hemodynamically significant stenosis of the visualized portion of the aorta. Conventional 3 vessel aortic branching pattern. The visualized proximal subclavian arteries are widely patent. RIGHT CAROTID SYSTEM: No dissection, occlusion or aneurysm. Mild atherosclerotic calcification at the carotid bifurcation without hemodynamically significant stenosis. LEFT CAROTID SYSTEM: There is mild atherosclerotic calcification at the carotid bifurcation with severe stenosis at the proximal external carotid artery. No internal carotid artery stenosis. VERTEBRAL ARTERIES: Right dominant configuration. Both origins are clearly patent. There is no dissection, occlusion or flow-limiting stenosis to the skull base (V1-V3 segments). CTA HEAD FINDINGS POSTERIOR CIRCULATION: --Vertebral arteries: Diminutive left V4 segment becomes non-opacified before reaching the vertebrobasilar confluence --Inferior cerebellar arteries: Poor visualization of left PICA. Right is normal. --Basilar artery: Normal. --Superior cerebellar arteries: Normal. --Posterior cerebral arteries (PCA): Multifocal moderate-to-severe stenosis of the right P2-P3 segments. ANTERIOR CIRCULATION: --Intracranial internal carotid arteries: Normal. --Anterior cerebral arteries (ACA): Normal. Both A1 segments are present. Patent anterior communicating artery (a-comm). --Middle cerebral arteries (MCA): Normal. VENOUS SINUSES: As permitted by contrast timing, patent. ANATOMIC VARIANTS: None Review of the MIP images confirms the above findings. IMPRESSION: 1. No emergent large vessel occlusion. 2. 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 3. Severe stenosis of the proximal left external carotid artery. 4. 3.6 cm incidental right thyroid nodule. Recommend thyroid [place] Reference: J Am Coll  Radiol. 2015 Feb;12(2): 143-50 Electronically Signed   By: KUlyses JarredM.D.   On: 03/02/2021 23:15   MR BRAIN WO CONTRAST  Result Date: 03/01/2021 CLINICAL DATA:  Dizziness EXAM: MRI HEAD WITHOUT CONTRAST TECHNIQUE: Multiplanar, multiecho pulse sequences of the brain and surrounding structures were obtained without intravenous contrast. COMPARISON:  None. FINDINGS: Brain: No acute infarct, mass effect or extra-axial collection. No acute or chronic hemorrhage. There is multifocal hyperintense T2-weighted signal within the white matter. Parenchymal volume and CSF spaces are normal. The midline structures are normal. Vascular: Major flow voids are preserved. Skull and upper cervical spine: Normal calvarium and skull base. Visualized upper cervical spine and soft tissues are normal. Sinuses/Orbits:Left maxillary sinus  opacification.  Normal orbits. IMPRESSION: 1. No acute intracranial abnormality. 2. Findings of chronic microvascular ischemia. Electronically Signed   By: Ulyses Jarred M.D.   On: 03/01/2021 22:38    Procedures .Critical Care Performed by: Lennice Sites, DO Authorized by: Lennice Sites, DO   Critical care provider statement:    Critical care time (minutes):  45   Critical care was necessary to treat or prevent imminent or life-threatening deterioration of the following conditions:  CNS failure or compromise   Critical care was time spent personally by me on the following activities:  Blood draw for specimens, development of treatment plan with patient or surrogate, discussions with consultants, discussions with primary provider, evaluation of patient's response to treatment, obtaining history from patient or surrogate, ordering and performing treatments and interventions, ordering and review of laboratory studies, ordering and review of radiographic studies, pulse oximetry, re-evaluation of patient's condition and review of old charts   Care discussed with: admitting provider      Medications Ordered in ED Medications  sodium chloride 0.9 % bolus 1,000 mL (1,000 mLs Intravenous New Bag/Given 03/02/21 2245)  diazepam (VALIUM) injection 2.5 mg (2.5 mg Intravenous Given 03/02/21 2145)  iohexol (OMNIPAQUE) 350 MG/ML injection 100 mL (100 mLs Intravenous Contrast Given 03/02/21 2246)  ondansetron (ZOFRAN) injection 4 mg (4 mg Intravenous Given 03/02/21 2334)  meclizine (ANTIVERT) tablet 25 mg (25 mg Oral Given 03/02/21 2334)    ED Course  I have reviewed the triage vital signs and the nursing notes.  Pertinent labs & imaging results that were available during my care of the patient were reviewed by me and considered in my medical decision making (see chart for details).    MDM Rules/Calculators/A&P                           KERRI ASCHE is a 65 year old male with history of congestive heart failure, diabetes, hypertension, high cholesterol presents to the ED with dizziness.  I evaluated the patient yesterday for the same.  He had negative MRI for stroke.  Sounds like he might not have picked up his prescription for meclizine.  Dizziness came back again today.  Feels off balance when he is walking again.  Denies any chest pain or shortness of breath.  No vision changes.  Neurologically appears to be intact but does get symptomatic when he stands up and tries to walk.  We will give him a dose of IV Valium.  We will get some vascular imaging with CTA of head and neck.  Suspect that this is a peripheral vertigo given normal MRI yesterday but still having symptoms.  Will give IV Valium and IV fluid bolus.  CTA is overall unremarkable.  Patient reevaluated and still fairly symptomatic.  I reached out to neurology to look at images that were obtained last night and tonight.  I have high suspicion that there may be a subtle stroke.  After talking with both neurology and with the neuroradiologist who also happened to read the MRI last night they both do suspect that there is a  subtle medullary infarct.  Overall will admit for further stroke care.  This chart was dictated using voice recognition software.  Despite best efforts to proofread,  errors can occur which can change the documentation meaning.   Final Clinical Impression(s) / ED Diagnoses Final diagnoses:  Cerebrovascular accident (CVA), unspecified mechanism ([place])    Rx / DC Orders ED Discharge Orders  None        Lennice Sites, DO 03/03/21 0025

## 2021-03-03 ENCOUNTER — Inpatient Hospital Stay (HOSPITAL_COMMUNITY): Payer: Medicare Other

## 2021-03-03 DIAGNOSIS — Z794 Long term (current) use of insulin: Secondary | ICD-10-CM | POA: Diagnosis not present

## 2021-03-03 DIAGNOSIS — I6381 Other cerebral infarction due to occlusion or stenosis of small artery: Principal | ICD-10-CM

## 2021-03-03 DIAGNOSIS — Z9114 Patient's other noncompliance with medication regimen: Secondary | ICD-10-CM | POA: Diagnosis not present

## 2021-03-03 DIAGNOSIS — M1990 Unspecified osteoarthritis, unspecified site: Secondary | ICD-10-CM | POA: Diagnosis present

## 2021-03-03 DIAGNOSIS — E782 Mixed hyperlipidemia: Secondary | ICD-10-CM | POA: Diagnosis not present

## 2021-03-03 DIAGNOSIS — I6389 Other cerebral infarction: Secondary | ICD-10-CM | POA: Diagnosis not present

## 2021-03-03 DIAGNOSIS — E11649 Type 2 diabetes mellitus with hypoglycemia without coma: Secondary | ICD-10-CM

## 2021-03-03 DIAGNOSIS — R29700 NIHSS score 0: Secondary | ICD-10-CM | POA: Diagnosis present

## 2021-03-03 DIAGNOSIS — E876 Hypokalemia: Secondary | ICD-10-CM | POA: Diagnosis present

## 2021-03-03 DIAGNOSIS — G4733 Obstructive sleep apnea (adult) (pediatric): Secondary | ICD-10-CM | POA: Diagnosis present

## 2021-03-03 DIAGNOSIS — E11628 Type 2 diabetes mellitus with other skin complications: Secondary | ICD-10-CM

## 2021-03-03 DIAGNOSIS — I10 Essential (primary) hypertension: Secondary | ICD-10-CM | POA: Diagnosis not present

## 2021-03-03 DIAGNOSIS — I639 Cerebral infarction, unspecified: Secondary | ICD-10-CM | POA: Diagnosis present

## 2021-03-03 DIAGNOSIS — R2689 Other abnormalities of gait and mobility: Secondary | ICD-10-CM | POA: Diagnosis not present

## 2021-03-03 DIAGNOSIS — Z20822 Contact with and (suspected) exposure to covid-19: Secondary | ICD-10-CM | POA: Diagnosis present

## 2021-03-03 DIAGNOSIS — Z833 Family history of diabetes mellitus: Secondary | ICD-10-CM | POA: Diagnosis not present

## 2021-03-03 DIAGNOSIS — R451 Restlessness and agitation: Secondary | ICD-10-CM | POA: Diagnosis not present

## 2021-03-03 DIAGNOSIS — I5022 Chronic systolic (congestive) heart failure: Secondary | ICD-10-CM | POA: Diagnosis present

## 2021-03-03 DIAGNOSIS — Z6841 Body Mass Index (BMI) 40.0 and over, adult: Secondary | ICD-10-CM | POA: Diagnosis not present

## 2021-03-03 DIAGNOSIS — J45909 Unspecified asthma, uncomplicated: Secondary | ICD-10-CM | POA: Diagnosis present

## 2021-03-03 DIAGNOSIS — Z809 Family history of malignant neoplasm, unspecified: Secondary | ICD-10-CM | POA: Diagnosis not present

## 2021-03-03 DIAGNOSIS — I110 Hypertensive heart disease with heart failure: Secondary | ICD-10-CM | POA: Diagnosis present

## 2021-03-03 DIAGNOSIS — Z881 Allergy status to other antibiotic agents status: Secondary | ICD-10-CM | POA: Diagnosis not present

## 2021-03-03 DIAGNOSIS — R0600 Dyspnea, unspecified: Secondary | ICD-10-CM | POA: Diagnosis present

## 2021-03-03 DIAGNOSIS — I63012 Cerebral infarction due to thrombosis of left vertebral artery: Secondary | ICD-10-CM | POA: Diagnosis not present

## 2021-03-03 DIAGNOSIS — Z79899 Other long term (current) drug therapy: Secondary | ICD-10-CM | POA: Diagnosis not present

## 2021-03-03 DIAGNOSIS — Z7984 Long term (current) use of oral hypoglycemic drugs: Secondary | ICD-10-CM | POA: Diagnosis not present

## 2021-03-03 DIAGNOSIS — E1165 Type 2 diabetes mellitus with hyperglycemia: Secondary | ICD-10-CM | POA: Diagnosis not present

## 2021-03-03 DIAGNOSIS — R471 Dysarthria and anarthria: Secondary | ICD-10-CM | POA: Diagnosis present

## 2021-03-03 DIAGNOSIS — I63013 Cerebral infarction due to thrombosis of bilateral vertebral arteries: Secondary | ICD-10-CM | POA: Diagnosis not present

## 2021-03-03 DIAGNOSIS — E7800 Pure hypercholesterolemia, unspecified: Secondary | ICD-10-CM | POA: Diagnosis present

## 2021-03-03 DIAGNOSIS — Z823 Family history of stroke: Secondary | ICD-10-CM | POA: Diagnosis not present

## 2021-03-03 DIAGNOSIS — I5020 Unspecified systolic (congestive) heart failure: Secondary | ICD-10-CM | POA: Diagnosis not present

## 2021-03-03 DIAGNOSIS — R251 Tremor, unspecified: Secondary | ICD-10-CM | POA: Diagnosis present

## 2021-03-03 DIAGNOSIS — E785 Hyperlipidemia, unspecified: Secondary | ICD-10-CM | POA: Diagnosis not present

## 2021-03-03 LAB — HIV ANTIBODY (ROUTINE TESTING W REFLEX): HIV Screen 4th Generation wRfx: NONREACTIVE

## 2021-03-03 LAB — HEMOGLOBIN A1C
Hgb A1c MFr Bld: 9.9 % — ABNORMAL HIGH (ref 4.8–5.6)
Mean Plasma Glucose: 237.43 mg/dL

## 2021-03-03 LAB — LIPID PANEL
Cholesterol: 340 mg/dL — ABNORMAL HIGH (ref 0–200)
HDL: 33 mg/dL — ABNORMAL LOW (ref >40)
LDL Cholesterol: 271 mg/dL — ABNORMAL HIGH (ref 0–99)
Total CHOL/HDL Ratio: 10.3 RATIO
Triglycerides: 182 mg/dL — ABNORMAL HIGH (ref <150)
VLDL: 36 mg/dL (ref 0–40)

## 2021-03-03 LAB — TSH: TSH: 1.331 u[IU]/mL (ref 0.350–4.500)

## 2021-03-03 LAB — CBG MONITORING, ED
Glucose-Capillary: 175 mg/dL — ABNORMAL HIGH (ref 70–99)
Glucose-Capillary: 196 mg/dL — ABNORMAL HIGH (ref 70–99)
Glucose-Capillary: 214 mg/dL — ABNORMAL HIGH (ref 70–99)
Glucose-Capillary: 199 mg/dL — ABNORMAL HIGH (ref 70–99)

## 2021-03-03 LAB — BRAIN NATRIURETIC PEPTIDE: B Natriuretic Peptide: 225.9 pg/mL — ABNORMAL HIGH (ref 0.0–100.0)

## 2021-03-03 MED ORDER — METFORMIN HCL ER 500 MG PO TB24: 500.0000 mg | ORAL_TABLET | Freq: Every day | ORAL | Status: DC

## 2021-03-03 MED ORDER — INSULIN DETEMIR 100 UNIT/ML ~~LOC~~ SOLN
15.0000 [IU] | Freq: Two times a day (BID) | SUBCUTANEOUS | Status: DC
  Administered 2021-03-03 – 2021-03-07 (×9): 15 [IU] via SUBCUTANEOUS
  Filled 2021-03-03 (×13): qty 0.15

## 2021-03-03 MED ORDER — ROSUVASTATIN CALCIUM 20 MG PO TABS
40.0000 mg | ORAL_TABLET | Freq: Every day | ORAL | Status: DC
  Administered 2021-03-03 – 2021-03-07 (×5): 40 mg via ORAL
  Filled 2021-03-03 (×5): qty 2

## 2021-03-03 MED ORDER — METFORMIN HCL ER 500 MG PO TB24
500.0000 mg | ORAL_TABLET | Freq: Two times a day (BID) | ORAL | Status: DC
  Filled 2021-03-03 (×10): qty 1

## 2021-03-03 MED ORDER — INSULIN ASPART 100 UNIT/ML IJ SOLN
5.0000 [IU] | Freq: Three times a day (TID) | INTRAMUSCULAR | Status: DC
  Administered 2021-03-03 – 2021-03-07 (×14): 5 [IU] via SUBCUTANEOUS

## 2021-03-03 MED ORDER — STROKE: EARLY STAGES OF RECOVERY BOOK
Freq: Once | Status: AC
  Filled 2021-03-03: qty 1

## 2021-03-03 MED ORDER — SERTRALINE HCL 50 MG PO TABS
50.0000 mg | ORAL_TABLET | Freq: Every day | ORAL | Status: DC
  Administered 2021-03-05: 50 mg via ORAL
  Filled 2021-03-03 (×3): qty 1

## 2021-03-03 MED ORDER — AMOXICILLIN-POT CLAVULANATE 875-125 MG PO TABS
1.0000 | ORAL_TABLET | Freq: Two times a day (BID) | ORAL | Status: DC
  Administered 2021-03-03: 1 via ORAL
  Filled 2021-03-03: qty 1

## 2021-03-03 MED ORDER — ENOXAPARIN SODIUM 60 MG/0.6ML IJ SOSY
55.0000 mg | PREFILLED_SYRINGE | INTRAMUSCULAR | Status: DC
  Administered 2021-03-03 – 2021-03-07 (×5): 55 mg via SUBCUTANEOUS
  Filled 2021-03-03: qty 0.55
  Filled 2021-03-03 (×3): qty 0.6
  Filled 2021-03-03: qty 0.55
  Filled 2021-03-03: qty 0.6

## 2021-03-03 MED ORDER — LIRAGLUTIDE 18 MG/3ML ~~LOC~~ SOPN: 1.8000 mg | PEN_INJECTOR | Freq: Every day | SUBCUTANEOUS | Status: DC

## 2021-03-03 MED ORDER — ASPIRIN 300 MG RE SUPP
300.0000 mg | Freq: Every day | RECTAL | Status: DC
  Filled 2021-03-03 (×3): qty 1

## 2021-03-03 MED ORDER — INSULIN ASPART 100 UNIT/ML IJ SOLN
0.0000 [IU] | Freq: Three times a day (TID) | INTRAMUSCULAR | Status: DC
  Administered 2021-03-03 (×2): 3 [IU] via SUBCUTANEOUS
  Administered 2021-03-03: 5 [IU] via SUBCUTANEOUS
  Administered 2021-03-04: 3 [IU] via SUBCUTANEOUS
  Administered 2021-03-04: 5 [IU] via SUBCUTANEOUS
  Administered 2021-03-04: 2 [IU] via SUBCUTANEOUS
  Administered 2021-03-05 – 2021-03-06 (×5): 3 [IU] via SUBCUTANEOUS
  Administered 2021-03-06: 11 [IU] via SUBCUTANEOUS
  Administered 2021-03-07: 3 [IU] via SUBCUTANEOUS
  Administered 2021-03-07: 5 [IU] via SUBCUTANEOUS

## 2021-03-03 MED ORDER — CLOPIDOGREL BISULFATE 75 MG PO TABS
75.0000 mg | ORAL_TABLET | Freq: Every day | ORAL | Status: DC
  Administered 2021-03-03 – 2021-03-07 (×5): 75 mg via ORAL
  Filled 2021-03-03 (×5): qty 1

## 2021-03-03 MED ORDER — EZETIMIBE 10 MG PO TABS: 10.0000 mg | ORAL_TABLET | Freq: Every day | ORAL | Status: DC

## 2021-03-03 MED ORDER — ASPIRIN 81 MG PO CHEW
81.0000 mg | CHEWABLE_TABLET | Freq: Every day | ORAL | Status: DC
  Administered 2021-03-03 – 2021-03-07 (×5): 81 mg via ORAL
  Filled 2021-03-03 (×6): qty 1

## 2021-03-03 MED ORDER — LOSARTAN POTASSIUM 50 MG PO TABS
50.0000 mg | ORAL_TABLET | Freq: Every day | ORAL | Status: DC
  Administered 2021-03-03: 50 mg via ORAL
  Filled 2021-03-03: qty 1

## 2021-03-03 MED ORDER — EMPAGLIFLOZIN 25 MG PO TABS
25.0000 mg | ORAL_TABLET | Freq: Every day | ORAL | Status: DC
  Administered 2021-03-03 – 2021-03-07 (×5): 25 mg via ORAL
  Filled 2021-03-03 (×7): qty 1

## 2021-03-03 MED ORDER — INSULIN ASPART 100 UNIT/ML IJ SOLN
0.0000 [IU] | Freq: Every day | INTRAMUSCULAR | Status: DC
  Administered 2021-03-06: 2 [IU] via SUBCUTANEOUS

## 2021-03-03 NOTE — H&P (Addendum)
Date: 03/03/2021               Patient Name:  Thomas Gardner MRN: [id number]  DOB: 11-22-1955 Age / Sex: 65 y.o., male   PCP: Thomas Presto, MD         Medical Service: Internal Medicine Teaching Service         Attending Physician: Dr. Velna Ochs, MD    First Contact: Dr. Lorin Gardner Pager: [phone]  Second Contact: Dr. 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  Patient states that he has been feeling dizzy since Tuesday.  State that he feels dizzy when standing up or closing his eyes.  Reports that he he has to use the wall to walk due to vertigo and tend to fall towards left side.  States that it resting and sitting still helps with the dizziness.  He denies any focal neurological deficit, any weakness, facial droop, dysphagia or vision changes.   Patient reports taking his blood pressure and diabetes medication consistently even though he does not know the medications names exactly.  Said that he only take one of the 2 cholesterol medication sparingly and does not know which one.  Said that one of them make him feel sick and nauseous.  Also report worsening shortness of breath with exertion the last few months.  States that his lower extremity edema comes and go but worse on the day that he have to stand on his feet.  Denies any weight gain.  Denies abdominal tenderness.  He was seen in the clinic in July and had an echo done which showed EF of 35-40%, consistent with his echo in 2013.  He does not have an cardiologist.  In the ED, CTA was unremarkable.  MRI of the brain initially  was negative.  However the neurologist and radiologist decided that there is a area of punctate focus in the left medullary pyramid which is consistent with an acute ischemia.  Neurology was consulted.  Meds:  Current Meds  Medication Sig   acetaminophen (TYLENOL) 650 MG CR tablet Take 325 mg by mouth every 8 (eight) hours as needed for pain.   albuterol (PROVENTIL HFA;VENTOLIN HFA) 108 (90 Base) MCG/ACT inhaler Inhale 2 puffs into the lungs every 6 (six) hours as needed for wheezing or shortness of breath. [facility]   carvedilol (COREG) 25 MG tablet Take 1 tablet (25 mg total) by mouth 2 (two) times daily with a meal.   chlorthalidone (HYGROTON) 25 MG tablet TAKE 1 TABLET (25 MG TOTAL) BY MOUTH DAILY. (Patient taking differently: Take 25 mg by mouth daily.)   cholecalciferol (VITAMIN D3) 25 MCG (1000 UNIT) tablet Take 1,000 Units by mouth daily.   empagliflozin (JARDIANCE) 10 MG TABS tablet Take 1 tablet (10 mg total) by mouth daily.   insulin glargine (LANTUS SOLOSTAR) 100 UNIT/ML Solostar Pen Inject 20 Units into the skin 2 (two) times daily. (Patient taking differently: Inject 25 Units into the skin 2 (two) times daily.)   insulin lispro (HUMALOG KWIKPEN) 100 UNIT/ML KwikPen Inject 18 Units into the skin  3 (three) times daily.   liraglutide (VICTOZA) 18 MG/3ML SOPN Inject 1.8 mg into the skin daily.   losartan (COZAAR) 100 MG tablet TAKE 1 TABLET (100 MG TOTAL) BY MOUTH DAILY.   vitamin C (ASCORBIC ACID) 500 MG tablet Take 500 mg by mouth daily.     Allergies: Allergies as of 03/02/2021 - Review Complete 03/02/2021  Allergen Reaction Noted   Erythromycin Nausea And Vomiting 01/09/2011   Past Medical History:  Diagnosis Date   Arthritis    Asthma    CHF (congestive heart failure) (HCC)    Diabetes mellitus 2003   GERD (gastroesophageal reflux disease)    Hyperlipidemia    Hypertension    Morbid obesity ([place])    Sleep apnea     Family History:  Family History  Problem  Relation Age of Onset   Cancer Mother        ovarian   Diabetes Mother    Stroke Father    Cancer Maternal Aunt        breast   Diabetes Maternal Aunt    Cancer Maternal Uncle        colon     Social History:  Never smoked Denies alcohol socially Denies any illicit drug use  Review of Systems: A complete ROS was negative except as per HPI.   Physical Exam: Blood pressure (!) 178/87, pulse 71, temperature 98.4 F (36.9 C), temperature source Oral, resp. rate 16, height 5\' 6"  (1.676 m), weight 113.2 kg, SpO2 98 %. Physical Exam Constitutional:      General: He is not in acute distress.    Appearance: Normal appearance. He is not ill-appearing or toxic-appearing.  HENT:     Head: Normocephalic.     Mouth/Throat:     Mouth: Mucous membranes are moist.     Pharynx: No oropharyngeal exudate.  Eyes:     General: No scleral icterus.       Right eye: No discharge.        Left eye: No discharge.     Conjunctiva/sclera: Conjunctivae normal.  Cardiovascular:     Rate and Rhythm: Normal rate and regular rhythm.     Heart sounds: Normal heart sounds.     Comments: +1 edema bilateral lower extremity.  No JVD Pulmonary:     Effort: Pulmonary effort is normal. No respiratory distress.     Comments: Crackles noted in the right lower lung base.  No wheezing Abdominal:     General: Bowel sounds are normal. There is no distension.     Palpations: Abdomen is soft.  Musculoskeletal:        General: No deformity. Normal range of motion.     Cervical back: Normal range of motion.  Skin:    General: Skin is warm.     Coloration: Skin is not jaundiced.  Neurological:     Mental Status: He is alert and oriented to person, place, and time.     Comments: EOM intact Visual field intact No cranial nerve deficits 5/5 strength, range of motion and sensation of bilateral upper and lower extremities Normal finger-to-nose test bilaterally Normal rapid alternating movement   Psychiatric:         Mood and Affect: Mood normal.        Behavior: Behavior normal.        Thought Content: Thought content normal.     EKG: personally reviewed my interpretation is normal sinus rhythm, very subtle ST elevation in V1 V2.  T wave inversion  in lead III.  LVH.  Left axis deviation.  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  Multiple risk factors of CVA with uncontrolled hypertension, diabetes and hyperlipidemia.  Last LDL 220 in 12/21.  A1c of 9 in June.  His CVA is likely small vessel disease given the size of the stroke.  CTA however showed multiple areas of severe stenosis such as the left external carotid artery or the right posterior cerebral artery. -Appreciate neurology recommendation -Defer echo.  Most recent echo in July was negative for PFO -Recheck A1c, lipid panel -Aspirin and Plavix for 21 days then aspirin alone -Permissive hypertension.  He may be out of the window given his symptoms started 4 days ago.  We will hold blood pressure meds for now -PT/OT/SLP  Type 2 diabetes Last A1c of 9 June.  Currently on Lantus 20 units twice daily with humalog 18 units 3 times daily. -Levemir 15 units twice daily -NovoLog 5 units 3 times daily -Sliding scale insulin -Pending A1c  Hyperlipidemia Last LDL 220 in December.  Was supposed to be on Crestor and Zetia but only taking 1 medication sparingly. -Recheck lipid panel -Resume Crestor and Zetia  Dyspnea  on exertion HFrEF Patient report worsening dyspnea on exertion in the last few months.  His most recent echo in July show EF of 35-40% which is consistent with his perfusion scan in 2013.  I do not see any ischemic work-up in the chart.  He is on carvedilol, losartan, chlorthalidone and Jardiance. Right lower lungs crackles noted on exam. -Check chest x-ray and BNP -Currently holding diuretics for permissive hypertension -Need to follow-up with cardiology outpatient  Hypertension Holding home blood pressure meds for permissive hypertension  Thyroid nodule Incidental finding of a 3.6 cm right thyroid nodule -Order thyroid ultrasound -Check TSH  Full code DVT: Lovenox Fluid: N/A Diet: Carb modified  Dispo: Admit patient to Inpatient with expected length of stay greater than 2 midnights.  Signed: Gaylan Gerold, DO 03/03/2021, 5:02 AM  Pager: [phone] After 5pm on weekdays and 1pm on weekends: On Call pager: [phone]

## 2021-03-03 NOTE — Consult Note (Addendum)
NEUROLOGY CONSULTATION NOTE   Date of service: March 03, 2021 Patient Name: Thomas Gardner MRN:  [id number] DOB:  [birth year] Reason for consult: "L medullary stroke" Requesting Provider: Lennice Sites, DO _ _ _   _ __   _ __ _ _  __ __   _ __   __ _  History of Present Illness  Thomas Gardner is a 65 y.o. male with PMH significant for DM2, HLD, HTN, Morbid obesity, OSA who presents with vertigo that started on the evening of 02/28/21. Also has posterior headache that started after wisdom tooth was removed. Initially came to the ED on 03/01/21 and MRI Was read as normal. It does show a small left meduallary stroke on [facility] imaging. He came in today with intense persistent dizziness. Has not taken meclizine. Review of the MRI demonstrated the L mdeullary stroke and so he was sent for further management of stroke and stroke workup. Vertigo is much better after he was given meds in the ED.  Does not smoke, no EtOH, no recreational drugs. Father had stroke. Reports his diabetes and cholesterol are poorly controlled.  mRS: 0 tNK/thrombecomty: outside window LKW: 02/28/21 NIHSS components Score: Comment  1a Level of Conscious 0[x]  1[]  2[]  3[]      1b LOC Questions 0[x]  1[]  2[]       1c LOC Commands 0[x]  1[]  2[]       2 Best Gaze 0[x]  1[]  2[]       3 Visual 0[x]  1[]  2[]  3[]      4 Facial Palsy 0[x]  1[]  2[]  3[]      5a Motor Arm - left 0[x]  1[]  2[]  3[]  4[]  UN[]    5b Motor Arm - Right 0[x]  1[]  2[]  3[]  4[]  UN[]    6a Motor Leg - Left 0[x]  1[]  2[]  3[]  4[]  UN[]    6b Motor Leg - Right 0[x]  1[]  2[]  3[]  4[]  UN[]    7 Limb Ataxia 0[x]  1[]  2[]  3[]  UN[]     8 Sensory 0[x]  1[]  2[]  UN[]      9 Best Language 0[x]  1[]  2[]  3[]      10 Dysarthria 0[x]  1[]  2[]  UN[]      11 Extinct. and Inattention 0[x]  1[]  2[]       TOTAL: 0      ROS   Constitutional Denies weight loss, fever and chills.   HEENT Denies changes in vision and hearing.   Respiratory Denies SOB and cough.   CV Denies palpitations and CP   GI Denies  abdominal pain, nausea, vomiting and diarrhea.   GU Denies dysuria and urinary frequency.   MSK Denies myalgia and joint pain.   Skin Denies rash and pruritus.   Neurological Denies headache and syncope.   Psychiatric Denies recent changes in mood. Denies anxiety and depression.    Past History   Past Medical History:  Diagnosis Date  . Arthritis   . Asthma   . CHF (congestive heart failure) ([place])   . Diabetes mellitus 2003  . GERD (gastroesophageal reflux disease)   . Hyperlipidemia   . Hypertension   . Morbid obesity ([place])   . Sleep apnea    Past Surgical History:  Procedure Laterality Date  . BREATH TEK H PYLORI  11/11/2011   Procedure: BREATH TEK H PYLORI;  Surgeon: Shann Medal, MD;  Location: [facility];  Service: General;  Laterality: N/A;  . CARDIAC CATHETERIZATION    . ESOPHAGOGASTRODUODENOSCOPY (EGD) WITH PROPOFOL N/A 05/04/2014   Procedure: ESOPHAGOGASTRODUODENOSCOPY (EGD) WITH PROPOFOL;  Surgeon: Cleotis Nipper, MD;  Location:  [facility];  Service: Endoscopy;  Laterality: N/A;   Family History  Problem Relation Age of Onset  . Cancer Mother        ovarian  . Diabetes Mother   . Stroke Father   . Cancer Maternal Aunt        breast  . Diabetes Maternal Aunt   . Cancer Maternal Uncle        colon   Social History   Socioeconomic History  . Marital status: Single    Spouse name: Not on file  . Number of children: Not on file  . Years of education: Not on file  . Highest education level: Not on file  Occupational History  . Occupation: Beautician  Tobacco Use  . Smoking status: Never  . Smokeless tobacco: Never  Vaping Use  . Vaping Use: Never used  Substance and Sexual Activity  . Alcohol use: No    Alcohol/week: 0.0 standard drinks  . Drug use: No  . Sexual activity: Not on file  Other Topics Concern  . Not on file  Social History Narrative  . Not on file   Social Determinants of Health   Financial Resource Strain: Not on file  Food  Insecurity: Not on file  Transportation Needs: Not on file  Physical Activity: Not on file  Stress: Not on file  Social Connections: Not on file   Allergies  Allergen Reactions  . Erythromycin Nausea And Vomiting    Medications  (Not in a hospital admission)    Vitals   Vitals:   03/02/21 2115 03/02/21 2200 03/03/21 0010 03/03/21 0230  BP: (!) 145/78 (!) 147/84 (!) 169/78 (!) 153/85  Pulse: 77 76 77 66  Resp: 17 18 16 17   Temp:      TempSrc:      SpO2: 97% 95% 95% 93%  Weight:      Height:         Body mass index is 40.28 kg/m.  Physical Exam   General: Laying comfortably in bed; in no acute distress.  HENT: Normal oropharynx and mucosa. Normal external appearance of ears and nose.  Neck: Supple, no pain or tenderness  CV: No JVD. No peripheral edema.  Pulmonary: Symmetric Chest rise. Normal respiratory effort.  Abdomen: Soft to touch, non-tender.  Ext: No cyanosis, edema, or deformity  Skin: No rash. Normal palpation of skin.   Musculoskeletal: Normal digits and nails by inspection. No clubbing.   Neurologic Examination  Mental status/Cognition: Alert, oriented to self, place, month and year, good attention.  Speech/language: Fluent, comprehension intact, object naming intact, repetition intact.  Cranial nerves:   CN II Pupils equal and reactive to light, no VF deficits    CN III,IV,VI EOM intact, no gaze preference or deviation, no nystagmus    CN V normal sensation in V1, V2, and V3 segments bilaterally    CN VII no asymmetry, no nasolabial fold flattening    CN VIII normal hearing to speech    CN IX & X normal palatal elevation, no uvular deviation    CN XI 5/5 head turn and 5/5 shoulder shrug bilaterally    CN XII midline tongue protrusion    Motor:  Muscle bulk: normal, tone normal, pronator drift none tremor none Mvmt Root Nerve  Muscle Right Left Comments  SA C5/6 Ax Deltoid 5 5   EF C5/6 Mc Biceps 5 5   EE C6/7/8 Rad Triceps 5 5   WF C6/7 Med  FCR  WE C7/8 PIN ECU     F Ab C8/T1 U ADM/FDI 4+ 5   HF L1/2/3 Fem Illopsoas 4+ 4   KE L2/3/4 Fem Quad 5 5   DF L4/5 D Peron Tib Ant 5 5   PF S1/2 Tibial Grc/Sol 5 5    Reflexes:  Right Left Comments  Pectoralis      Biceps (C5/6) 2 2   Brachioradialis (C5/6) 2 2    Triceps (C6/7) 2 2    Patellar (L3/4) 2 2    Achilles (S1)      Hoffman      Plantar     Jaw jerk    Sensation:  Light touch intact   Pin prick    Temperature    Vibration   Proprioception    Coordination/Complex Motor:  - Finger to Nose intact - Heel to shin intact - Rapid alternating movement are normal - Gait: unsafe to assess, has been leaning and falling to left at home.  Labs   CBC:  Recent Labs  Lab 03/01/21 1317 03/02/21 1801  WBC 8.4 10.1  NEUTROABS 5.5 7.3  HGB 13.9 14.4  HCT 43.9 44.3  MCV 82.8 80.8  PLT 203 951    Basic Metabolic Panel:  Lab Results  Component Value Date   NA 138 03/02/2021   K 3.7 03/02/2021   CO2 28 03/02/2021   GLUCOSE 266 (H) 03/02/2021   BUN 27 (H) 03/02/2021   CREATININE 1.20 03/02/2021   CALCIUM 9.6 03/02/2021   GFRNONAA >60 03/02/2021   GFRAA 92 04/26/2020   Lipid Panel:  Lab Results  Component Value Date   LDLCALC 220 (H) 04/26/2020   HgbA1c:  Lab Results  Component Value Date   HGBA1C 9.0 (A) 11/01/2020   Urine Drug Screen:     Component Value Date/Time   LABOPIA POSITIVE (A) 03/01/2021 1641   COCAINSCRNUR NONE DETECTED 03/01/2021 1641   LABBENZ NONE DETECTED 03/01/2021 1641   AMPHETMU NONE DETECTED 03/01/2021 1641   THCU NONE DETECTED 03/01/2021 1641   LABBARB NONE DETECTED 03/01/2021 1641    Alcohol Level No results found for: [place]  CT Head without contrast: Personally reviewed and CTH was negative for a large hypodensity concerning for a large territory infarct or hyperdensity concerning for an ICH  MR Angio head without contrast and Carotid Duplex BL: pending  MRI Brain: L medullary pyramid small stroke. Impression    Thomas Gardner is a 65 y.o. male with PMH significant for with PMH significant for DM2, HLD, HTN, Morbid obesity, OSA who presents with vertigo that started on the evening of 02/28/21, found to have a Left medullary pyramid stroke. His neurologic examination is notable for mild L hip flexion weakness which probably explains the fact that he was leaning top his left. Mild vertigo and almost avoids looking to the extreme left as that can elicit his vertigo. However, this is much better.  I think his stroke is probably a small vessel stroke.  Primary Diagnosis:  Other cerebral infarction due to occlusion of stenosis of small artery.  Secondary Diagnosis: Essential (primary) hypertension, Type 2 diabetes mellitus w/o complications, and Morbid Obesity(BMI > 40)  Recommendations  Plan:  Recommend that primary team order following: - Frequent Neuro checks per stroke unit protocol - Recommend Vascular imaging with MRA Angio Head without contrast and US Carotid doppler - Recently had TTE in July 2022 with EF of 35-40%, no mention of PFO. I do not feel a need to repeat it  at this time. - Recommend obtaining Lipid panel with LDL - Please start statin if LDL > 70 - Recommend HbA1c - Antithrombotic - aspirin 81mg  daily along with plavix 75 mg daily for 21 days, then aspirin 81mg  daily alone. - Recommend DVT ppx - SBP goal - permissive hypertension first 24 h < 220/110. Held home meds.  - Recommend Telemetry monitoring for arrythmia - Recommend bedside swallow screen prior to PO intake. - Stroke education booklet - Recommend PT/OT/SLP consult - Urine drug screen positive for opiates. It does seem like he was placed on Norco outpatient recently - Stroke team to follow. ______________________________________________________________________  Plan discussed with Dr. Gaylan Gerold with the Internal Medicine team.  Thank you for the opportunity to take part in the care of this patient. If you have  any further questions, please contact the neurology consultation attending.  Signed,  [place] Pager Number [phone] _ _ _   _ __   _ __ _ _  __ __   _ __   __ _

## 2021-03-03 NOTE — Progress Notes (Addendum)
Subjective: Patient evaluated at bedside.  States that he is feeling well.  Dizziness is improved.  He was able to drink some water and soda has not tried eating yet.  Was having some vomiting at home due to his dizziness but not having any vomiting now.  Does want of his cholesterol medications made him feel unwell but does not remember the name.  Discussed importance of cholesterol control and we will start him on one of his medications to see how he tolerates that.  He denies chest pain, shortness of breath, weakness, change in sensation.  Objective:  Vital signs in last 24 hours: Vitals:   03/03/21 0010 03/03/21 0230 03/03/21 0402 03/03/21 0936  BP: (!) 169/78 (!) 153/85 (!) 178/87 (!) 150/61  Pulse: 77 66 71 83  Resp: 16 17 16 18   Temp:   98.4 F (36.9 C) 98.5 F (36.9 C)  TempSrc:   Oral   SpO2: 95% 93% 98% 98%  Weight:      Height:       Constitutional: Appears well-developed and well-nourished. No distress.  HENT: Normocephalic and atraumatic, EOMI, conjunctiva normal, moist mucous membranes Cardiovascular: Normal rate, regular rhythm, S1 and S2 present, no murmurs, rubs, gallops.  Distal pulses intact Respiratory: No respiratory distress, no accessory muscle use.  Effort is normal.  Lungs are clear to auscultation bilaterally. GI: Nondistended, soft, nontender to palpation, normal active bowel sounds Musculoskeletal: No peripheral edema, moving all extremities neurological: Is alert and oriented x4, 5/5 strength bilaterally, sensation symmetric.  Cranial nerves intact.  No facial droop, speech is clear.  No nystagmus Skin: Warm and dry.  No rash, erythema, lesions noted.  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  Multiple risk factors of CVA with uncontrolled hypertension, diabetes and hyperlipidemia.  LDL increased to 71 since last lipid panel.  A1c 9>9.9. His CVA is likely small vessel disease given the size of the stroke.  CTA however showed multiple areas of severe stenosis such as the left external carotid artery or the right posterior cerebral artery. -Appreciate neurology recommendation -f/u echo -Aspirin 81mg  and Plavix for 21 days then aspirin alone -May be out of window for permissive hypertension although he may have higher blood pressure goal given left carotid artery stenosis.  We will restart losartan today. -PT/OT   Type 2 diabetes A1c of 9.9.  Currently on Lantus 20 units twice daily with humalog 18 units 3 times daily.  CBGs in 170s this morning. -Levemir 15 units twice daily -NovoLog 5 units 3 times daily -Sliding scale insulin - Resume home victoza - Resume home jardiance, increase to 25 mg - Resume home, increase to 500 mg BID    Hyperlipidemia Last LDL 220 in December.  Was supposed to be on Crestor and Zetia stopped due to side effects of one of the medications.  Cholesterol 340, LDL 271, triglycerides 182.  We will restart rosuvastatin.  We will hold off on Zetia as it may be causing him GI side effects in the past.  Given elevated cholesterol may benefit from PCSK 9 inhibitors, will need outpatient follow-up for this. -Restart Crestor 40 mg daily   Dyspnea on exertion Chronic HFrEF Not currently feeling short of breath.  His most recent echo in July show EF of 35-40% which is consistent with his perfusion scan in 2013.  Lung exam normal no lower extremity edema.  Appears euvolemic.  Weight is stable since June.  BNP is mildly elevated at 255.  Chest x-ray normal. -Holding antihypertensives in setting of acute stroke. -Need to follow-up with cardiology outpatient   Hypertension Holding home blood pressure meds for permissive hypertension   Thyroid  nodule TSH within normal limits.  Complex cystic mass in the midline superficial to the thyroid isthmus appears increased since imaging in 2021. -We will need outpatient FNA and ENT referral  Prior to Admission Living Arrangement:home Anticipated Discharge Location: pending pt/ot eval Barriers to Discharge: stroke work up, pt/ot Dispo: Anticipated discharge in approximately 1-2 day(s).   Iona Beard, MD 03/03/2021, 10:23 AM Pager: [phone] After 5pm on weekdays and 1pm on weekends: On Call pager [phone]

## 2021-03-03 NOTE — ED Notes (Signed)
Patient transported to X-ray 

## 2021-03-03 NOTE — ED Provider Notes (Signed)
Discussed with Dr. Joellyn Haff of teaching service.  He will see the patient in the [facility], ED.  [place], charge nurse, agrees to accept the patient in transfer from [facility] ED.  Dr. Ralene Bathe is the accepting EDP.     Shanon Rosser, MD 03/03/21 [phone]

## 2021-03-03 NOTE — ED Notes (Signed)
Pt provided with a urinal and a screen for privacy. Pt attempting to use urinal at this time

## 2021-03-03 NOTE — ED Notes (Signed)
Pt transported to ultrasound.

## 2021-03-03 NOTE — ED Notes (Signed)
Breakfast Order Placed ?

## 2021-03-03 NOTE — Progress Notes (Addendum)
STROKE TEAM PROGRESS NOTE   ATTENDING NOTE: I reviewed above note and agree with the assessment and plan. Pt was seen and examined.   65 year old male with history of uncontrolled diabetes, hypertension, hyperlipidemia, morbid obesity, OSA, noncompliant with medication admitted for vertigo, nausea, hoarseness speech.  Currently symptoms much resolved except dizziness with head motion.  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  CTA head and neck moderate to severe stenosis of the bilateral PCAs as well as bilateral ICA siphon atherosclerosis, right more than left.  EF in 11/2020 35 to 40%, this admission 2D echo pending.  A1c 9.9, LDL 271.  UDS positive for opiates (he took hydrocodone for wisdom teeth removal).  On exam, patient awake alert, sitting at edge of bed having dinner.  Orientated x3, no aphasia, no dysarthria although slight hoarseness.  Follows simple commands, able to name repeat.  No gaze palsy, visual fields full, facial symmetrical.  Bilateral upper and lower extremity equal strengths and sensation.  However, left finger-to-nose mild ataxia.  Not able to perform left heel-to-shin due to morbid obesity.  Etiology for patient stroke likely due to small vessel disease in the setting of uncontrolled stroke risk factors.  Currently on aspirin 81 and Plavix 75 DAPT, recommend continue DAPT for 3 weeks and then aspirin alone.  Continue Crestor 40 on discharge.  Follow-up with 2D echo, if still has low EF, he likely needed cardiology consult for ischemic cardiomyopathy work-up.  Discussed with primary team.  We will follow  For detailed assessment and plan, please refer to above as I have made changes wherever appropriate.   Rosalin Hawking, MD PhD Stroke Neurology 03/03/2021 7:27 PM    INTERVAL HISTORY No acute events since arrival. No visitors at bedside. Presented with intense persistent dizziness which he describes as room spinning sensation and  lightheadedness. Today he reports this has improved significantly but is still present at a low level with resolution of room spinning. He now intermittently has the sensation of lightheadedness alone. He reports he does not take his cholesterol medications because they make him "feel bad". We discussed his ongoing stroke work up and plan of care and importance of medication compliance to control stroke risk factors. He had no questions.   Vitals:   03/03/21 0010 03/03/21 0230 03/03/21 0402 03/03/21 0936  BP: (!) 169/78 (!) 153/85 (!) 178/87 (!) 150/61  Pulse: 77 66 71 83  Resp: 16 17 16 18   Temp:   98.4 F (36.9 C) 98.5 F (36.9 C)  TempSrc:   Oral   SpO2: 95% 93% 98% 98%  Weight:      Height:       CBC:  Recent Labs  Lab 03/01/21 1317 03/02/21 1801  WBC 8.4 10.1  NEUTROABS 5.5 7.3  HGB 13.9 14.4  HCT 43.9 44.3  MCV 82.8 80.8  PLT 203 676   Basic Metabolic Panel:  Recent Labs  Lab 03/01/21 1317 03/02/21 1801  NA 137 138  K 3.7 3.7  CL 99 101  CO2 27 28  GLUCOSE 220* 266*  BUN 24* 27*  CREATININE 1.19 1.20  CALCIUM 9.3 9.6   Lipid Panel:  Recent Labs  Lab 03/03/21 0500  CHOL 340*  TRIG 182*  HDL 33*  CHOLHDL 10.3  VLDL 36  LDLCALC 271*   HgbA1c:  Recent Labs  Lab 03/03/21 0500  HGBA1C 9.9*   Urine Drug Screen:  Recent Labs  Lab 03/01/21 1641  LABOPIA POSITIVE*  COCAINSCRNUR NONE  DETECTED  LABBENZ NONE DETECTED  AMPHETMU NONE DETECTED  THCU NONE DETECTED  LABBARB NONE DETECTED    Alcohol Level No results for input(s): ETH in the last 168 hours.  IMAGING past 24 hours CT Angio Head W or Wo Contrast  Result Date: 03/02/2021 CLINICAL DATA:  Vertigo EXAM: CT ANGIOGRAPHY HEAD AND NECK TECHNIQUE: Multidetector CT imaging of the head and neck was performed using the standard protocol during bolus administration of intravenous contrast. Multiplanar CT image reconstructions and MIPs were obtained to evaluate the vascular anatomy. Carotid stenosis  measurements (when applicable) are obtained utilizing NASCET criteria, using the distal internal carotid diameter as the denominator. CONTRAST:  169mL OMNIPAQUE IOHEXOL 350 MG/ML SOLN COMPARISON:  None. FINDINGS: CT HEAD FINDINGS Brain: There is no mass, hemorrhage or extra-axial collection. The size and configuration of the ventricles and extra-axial CSF spaces are normal. There is hypoattenuation of the periventricular white matter, most commonly indicating chronic ischemic microangiopathy. Skull: The visualized skull base, calvarium and extracranial soft tissues are normal. Sinuses/Orbits: No fluid levels or advanced mucosal thickening of the visualized paranasal sinuses. No mastoid or middle ear effusion. The orbits are normal. CTA NECK FINDINGS SKELETON: There is no bony spinal canal stenosis. No lytic or blastic lesion. OTHER NECK: Normal pharynx, larynx and major salivary glands. No cervical lymphadenopathy. 3.6 cm heterogeneous nodule in the right lobe of the thyroid gland. UPPER CHEST: No pneumothorax or pleural effusion. No nodules or masses. AORTIC ARCH: There is calcific atherosclerosis of the aortic arch. There is no aneurysm, dissection or hemodynamically significant stenosis of the visualized portion of the aorta. Conventional 3 vessel aortic branching pattern. The visualized proximal subclavian arteries are widely patent. RIGHT CAROTID SYSTEM: No dissection, occlusion or aneurysm. Mild atherosclerotic calcification at the carotid bifurcation without hemodynamically significant stenosis. LEFT CAROTID SYSTEM: There is mild atherosclerotic calcification at the carotid bifurcation with severe stenosis at the proximal external carotid artery. No internal carotid artery stenosis. VERTEBRAL ARTERIES: Right dominant configuration. Both origins are clearly patent. There is no dissection, occlusion or flow-limiting stenosis to the skull base (V1-V3 segments). CTA HEAD FINDINGS POSTERIOR CIRCULATION:  --Vertebral arteries: Diminutive left V4 segment becomes non-opacified before reaching the vertebrobasilar confluence --Inferior cerebellar arteries: Poor visualization of left PICA. Right is normal. --Basilar artery: Normal. --Superior cerebellar arteries: Normal. --Posterior cerebral arteries (PCA): Multifocal moderate-to-severe stenosis of the right P2-P3 segments. ANTERIOR CIRCULATION: --Intracranial internal carotid arteries: Normal. --Anterior cerebral arteries (ACA): Normal. Both A1 segments are present. Patent anterior communicating artery (a-comm). --Middle cerebral arteries (MCA): Normal. VENOUS SINUSES: As permitted by contrast timing, patent. ANATOMIC VARIANTS: None Review of the MIP images confirms the above findings. IMPRESSION: 1. No emergent large vessel occlusion. 2. 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 3. Severe stenosis of the proximal left external carotid artery. 4. 3.6 cm incidental right thyroid nodule. Recommend thyroid US. Reference: J Am Coll Radiol. 2015 Feb;12(2): 143-50 Electronically Signed   By: Ulyses Jarred M.D.   On: 03/02/2021 23:15   DG Chest 1 View  Result Date: 03/03/2021 CLINICAL DATA:  Dyspnea and dizziness. EXAM: CHEST  1 VIEW COMPARISON:  Chest x-ray dated 07/30/2016. FINDINGS: Heart size is upper normal. Overall cardiomediastinal silhouette is within normal limits in size and configuration. Lungs are clear. No pleural effusion or pneumothorax is seen. Osseous structures about the chest are unremarkable. IMPRESSION: No active disease. No evidence of pneumonia or pulmonary edema. Electronically Signed   By: Franki Cabot M.D.   On: 03/03/2021  05:29   CT Angio Neck W and/or Wo Contrast  Result Date: 03/02/2021 CLINICAL DATA:  Vertigo EXAM: CT ANGIOGRAPHY HEAD AND NECK TECHNIQUE: Multidetector CT imaging of the head and neck was performed using the standard protocol during bolus administration of intravenous  contrast. Multiplanar CT image reconstructions and MIPs were obtained to evaluate the vascular anatomy. Carotid stenosis measurements (when applicable) are obtained utilizing NASCET criteria, using the distal internal carotid diameter as the denominator. CONTRAST:  124mL OMNIPAQUE IOHEXOL 350 MG/ML SOLN COMPARISON:  None. FINDINGS: CT HEAD FINDINGS Brain: There is no mass, hemorrhage or extra-axial collection. The size and configuration of the ventricles and extra-axial CSF spaces are normal. There is hypoattenuation of the periventricular white matter, most commonly indicating chronic ischemic microangiopathy. Skull: The visualized skull base, calvarium and extracranial soft tissues are normal. Sinuses/Orbits: No fluid levels or advanced mucosal thickening of the visualized paranasal sinuses. No mastoid or middle ear effusion. The orbits are normal. CTA NECK FINDINGS SKELETON: There is no bony spinal canal stenosis. No lytic or blastic lesion. OTHER NECK: Normal pharynx, larynx and major salivary glands. No cervical lymphadenopathy. 3.6 cm heterogeneous nodule in the right lobe of the thyroid gland. UPPER CHEST: No pneumothorax or pleural effusion. No nodules or masses. AORTIC ARCH: There is calcific atherosclerosis of the aortic arch. There is no aneurysm, dissection or hemodynamically significant stenosis of the visualized portion of the aorta. Conventional 3 vessel aortic branching pattern. The visualized proximal subclavian arteries are widely patent. RIGHT CAROTID SYSTEM: No dissection, occlusion or aneurysm. Mild atherosclerotic calcification at the carotid bifurcation without hemodynamically significant stenosis. LEFT CAROTID SYSTEM: There is mild atherosclerotic calcification at the carotid bifurcation with severe stenosis at the proximal external carotid artery. No internal carotid artery stenosis. VERTEBRAL ARTERIES: Right dominant configuration. Both origins are clearly patent. There is no dissection,  occlusion or flow-limiting stenosis to the skull base (V1-V3 segments). CTA HEAD FINDINGS POSTERIOR CIRCULATION: --Vertebral arteries: Diminutive left V4 segment becomes non-opacified before reaching the vertebrobasilar confluence --Inferior cerebellar arteries: Poor visualization of left PICA. Right is normal. --Basilar artery: Normal. --Superior cerebellar arteries: Normal. --Posterior cerebral arteries (PCA): Multifocal moderate-to-severe stenosis of the right P2-P3 segments. ANTERIOR CIRCULATION: --Intracranial internal carotid arteries: Normal. --Anterior cerebral arteries (ACA): Normal. Both A1 segments are present. Patent anterior communicating artery (a-comm). --Middle cerebral arteries (MCA): Normal. VENOUS SINUSES: As permitted by contrast timing, patent. ANATOMIC VARIANTS: None Review of the MIP images confirms the above findings. IMPRESSION: 1. No emergent large vessel occlusion. 2. 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 3. Severe stenosis of the proximal left external carotid artery. 4. 3.6 cm incidental right thyroid nodule. Recommend thyroid US. Reference: J Am Coll Radiol. 2015 Feb;12(2): 143-50 Electronically Signed   By: Ulyses Jarred M.D.   On: 03/02/2021 23:15   US THYROID  Result Date: 03/03/2021 CLINICAL DATA:  Known thyroid nodules and midline complex cystic mass. Findings again seen on CT imaging performed recently. EXAM: THYROID ULTRASOUND TECHNIQUE: Ultrasound examination of the thyroid gland and adjacent soft tissues was performed. COMPARISON:  Prior thyroid ultrasound 02/14/2020 FINDINGS: Parenchymal Echotexture: Mildly heterogenous Isthmus: 0.8 cm Right lobe: 6.1 x 3.9 x 3.5 cm Left lobe: 5.3 x 2.4 x 2.2 cm _________________________________________________________ Estimated total number of nodules >/= 1 cm: 1 Number of spongiform nodules >/=  2 cm not described below (TR1): 0 Number of mixed cystic and solid nodules >/= 1.5 cm  not described below ([place]): 0 _________________________________________________________ Nodule # 1: Prior biopsy: No  Location: Right; Inferior Maximum size: 4.7 cm; Other 2 dimensions: 3.1 x 3.7 cm, previously, 4.9 x 3.5 x 3.5 cm Composition: solid/almost completely solid (2) Echogenicity: isoechoic (1) Shape: not taller-than-wide (0) Margins: ill-defined (0) Echogenic foci: none (0) ACR TI-RADS total points: 3. ACR TI-RADS risk category:  TR3 (3 points). Significant change in size (>/= 20% in two dimensions and minimal increase of 2 mm): No Change in features: No Change in ACR TI-RADS risk category: No ACR TI-RADS recommendations: **Given size (>/= 2.5 cm) and appearance, fine needle aspiration of this mildly suspicious nodule should be considered based on TI-RADS criteria. _________________________________________________________ As previously noted, there is a circumscribed complex cystic lesion in the superficial soft tissues of the midline of the neck separate from and slightly superior to the thyroid isthmus. This lesion demonstrates posterior acoustic enhancement consistent with its complex cystic nature and measures 1.8 x 1.0 x 1.9 cm. This may be slightly enlarged compared to 1.8 x 1.1 x 1.6 cm previously. IMPRESSION: 1. Perhaps slight interval enlargement of complex cystic mass in the midline of the neck separate from and superficial to the thyroid isthmus. As described on the prior thyroid ultrasound, differential considerations include complex thyroglossal duct cyst, sebaceous/epidermoid inclusion cyst and less likely exophytic thyroid nodule. As before, referral to ENT is recommended for further assessment and management if not already obtained. 2. Stable 4.7 cm TI-RADS category 3 (mildly suspicious) mass in the right inferior thyroid gland. As before, this lesion meets criteria to consider fine-needle aspiration biopsy if not already performed. 3. No new nodules or suspicious features. The above is in  keeping with the ACR TI-RADS recommendations - J Am Coll Radiol 2017;14:587-595. Electronically Signed   By: Jacqulynn Cadet M.D.   On: 03/03/2021 06:50    PHYSICAL EXAM  Temp:  [98.2 F (36.8 C)-98.5 F (36.9 C)] 98.5 F (36.9 C) (10/15 0936) Pulse Rate:  [66-85] 83 (10/15 1331) Resp:  [16-18] 16 (10/15 1331) BP: (145-197)/(61-102) 197/77 (10/15 1331) SpO2:  [93 %-100 %] 100 % (10/15 1331) Weight:  [113.2 kg] 113.2 kg (10/14 1640)  General - Well nourished, well developed, in no apparent distress, sitting up on side of stretcher eating with good appetite.   Ophthalmologic - fundi not visualized due to noncooperation.  Cardiovascular - Regular rhythm and rate on bedside monitor.  Mental Status -  Level of arousal and orientation to time, place, and person were intact. Language including expression, naming, repetition, comprehension was assessed and found intact. Attention span and concentration were normal. Recent and remote memory were intact. Fund of Knowledge was assessed and was intact.  Cranial Nerves II - XII - II - Visual field intact OU. III, IV, VI - Extraocular movements intact. V - Facial sensation intact bilaterally. VII - Facial movement intact bilaterally. VIII - Hearing & vestibular intact bilaterally. X - Palate elevates symmetrically. XI - Chin turning & shoulder shrug intact bilaterally. XII - Tongue protrusion intact.  Motor Strength - The patient's strength was normal in all extremities except left hip flexor which was 4/5 and pronator drift was absent.  Bulk was normal and fasciculations were absent.   Motor Tone - Muscle tone was assessed at the neck and appendages and was normal.  Sensory - Light touch intact V1-V3 and x4 extremities.   Coordination - The patient had normal movements in the hands and feet with no ataxia or dysmetria.  Tremor was absent.  Gait and Station - deferred.   ASSESSMENT/PLAN Thomas Gardner is  a 65 y.o. male with PMH  significant for DM2, HLD, HTN, Morbid obesity, OSA who presents with vertigo that started on the evening of 02/28/21. Also has posterior headache that started after wisdom tooth was removed. Initially came to the ED on 03/01/21 and MRI Was read as normal. It does show a small left meduallary stroke on DWI imaging. He came in today with intense persistent dizziness. Has not taken meclizine. Review of the MRI demonstrated the L mdeullary stroke and so he was sent for further management of stroke and stroke workup. Vertigo is much better after he was given meds in the ED.   Does not smoke, no EtOH, no recreational drugs. Father had stroke. Reports his diabetes and cholesterol are poorly controlled.  Stroke Punctate focus of abnormal diffusion restriction in the left medullary pyramid. This may represent a small focus of acute ischemia.  Code Stroke CT head: No acute intracranial abnormalities. Mild chronic cerebral atrophy and small vessel ischemic changes. CTA head & neck  1. No emergent large vessel occlusion. 2. 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 3. Severe stenosis of the proximal left external carotid artery. 4. 3.6 cm incidental right thyroid nodule.  MRI   Punctate focus of abnormal diffusion restriction in the left medullary pyramid. This may represent a small focus of acute ischemia. 2D Echo PENDING  LDL 271 HgbA1c 9.9 VTE prophylaxis - recommended     Diet   Diet Carb Modified Fluid consistency: Thin; Room service appropriate? Yes   Not on anticoagulant or antiplatelet prior to admission Plan per [name].  Therapy recommendations:  PENDING Disposition:  TBD       ICS  Multifocal moderate-to-severe stenosis of the right posterior     cerebral artery P2-P3 segments. Avoid hypotension        HRrER 2D Echo pending Recently had TTE in July 2022 with EF of 35-40%, no mention of PFO IM planning to refer to cardiology to  establish care, further evaluation   Hypertension Stable Permissive hypertension (OK if < 220/120) but gradually normalize in 5-7 days Avoid hypotension  Long-term BP goal normotensive  Hyperlipidemia Home meds:  Crestor 40mg , Zetia 10mg   LDL 271, goal < 70 High intensity statin: continue home regimen  Continue statin at discharge Close PCP follow up   Diabetes type II Uncontrolled HgbA1c 9.9, goal < 7.0 CBGs Recent Labs    03/01/21 2102 03/03/21 0808 03/03/21 1155  GLUCAP 163* 175* 196*    Management per primary team  Close PCP follow up for management   Other Stroke Risk Factors Advanced Age >/= 65  Obesity, Body mass index is 40.28 kg/m., BMI >/= 30 associated with increased stroke risk, recommend weight loss, diet and exercise as appropriate  Family hx stroke (father) High risk for Obstructive sleep apnea   Other Active Problems  Thyroid nodule eval underway   Hospital day # 0  This plan of care was directed by [name].  Hetty Blend, NP-C   To contact Stroke Continuity provider, please refer to [email or web address] After hours, contact General Neurology

## 2021-03-04 ENCOUNTER — Other Ambulatory Visit (HOSPITAL_COMMUNITY): Payer: Medicare Other

## 2021-03-04 ENCOUNTER — Inpatient Hospital Stay (HOSPITAL_COMMUNITY): Payer: Medicare Other

## 2021-03-04 DIAGNOSIS — I6389 Other cerebral infarction: Secondary | ICD-10-CM

## 2021-03-04 DIAGNOSIS — I63012 Cerebral infarction due to thrombosis of left vertebral artery: Secondary | ICD-10-CM

## 2021-03-04 DIAGNOSIS — I5020 Unspecified systolic (congestive) heart failure: Secondary | ICD-10-CM | POA: Diagnosis not present

## 2021-03-04 LAB — ECHOCARDIOGRAM COMPLETE
AR max vel: 1.97 cm2
AV Area VTI: 2.04 cm2
AV Area mean vel: 2.01 cm2
AV Mean grad: 5 mmHg
AV Peak grad: 7.8 mmHg
Ao pk vel: 1.4 m/s
Area-P 1/2: 4.99 cm2
Height: 66 in
MV VTI: 2.54 cm2
S' Lateral: 4.1 cm
Weight: 3992.97 oz

## 2021-03-04 LAB — GLUCOSE, CAPILLARY
Glucose-Capillary: 137 mg/dL — ABNORMAL HIGH (ref 70–99)
Glucose-Capillary: 227 mg/dL — ABNORMAL HIGH (ref 70–99)
Glucose-Capillary: 154 mg/dL — ABNORMAL HIGH (ref 70–99)

## 2021-03-04 LAB — BASIC METABOLIC PANEL
Anion gap: 8 (ref 5–15)
BUN: 18 mg/dL (ref 8–23)
CO2: 29 mmol/L (ref 22–32)
Calcium: 9 mg/dL (ref 8.9–10.3)
Chloride: 101 mmol/L (ref 98–111)
Creatinine, Ser: 1.2 mg/dL (ref 0.61–1.24)
GFR, Estimated: >60 mL/min (ref >60)
Glucose, Bld: 149 mg/dL — ABNORMAL HIGH (ref 70–99)
Potassium: 3.8 mmol/L (ref 3.5–5.1)
Sodium: 138 mmol/L (ref 135–145)

## 2021-03-04 LAB — HEPATIC FUNCTION PANEL
ALT: 14 U/L (ref 0–44)
AST: 13 U/L — ABNORMAL LOW (ref 15–41)
Albumin: 3.5 g/dL (ref 3.5–5.0)
Alkaline Phosphatase: 62 U/L (ref 38–126)
Bilirubin, Direct: 0.1 mg/dL (ref 0.0–0.2)
Indirect Bilirubin: 0.8 mg/dL (ref 0.3–0.9)
Total Bilirubin: 0.9 mg/dL (ref 0.3–1.2)
Total Protein: 6.9 g/dL (ref 6.5–8.1)

## 2021-03-04 LAB — CBG MONITORING, ED: Glucose-Capillary: 172 mg/dL — ABNORMAL HIGH (ref 70–99)

## 2021-03-04 MED ORDER — LOSARTAN POTASSIUM 50 MG PO TABS
100.0000 mg | ORAL_TABLET | Freq: Every day | ORAL | Status: DC
  Administered 2021-03-05 – 2021-03-07 (×3): 100 mg via ORAL
  Filled 2021-03-04 (×4): qty 2

## 2021-03-04 MED ORDER — AMOXICILLIN-POT CLAVULANATE 875-125 MG PO TABS
1.0000 | ORAL_TABLET | Freq: Two times a day (BID) | ORAL | Status: DC
  Administered 2021-03-04 – 2021-03-06 (×4): 1 via ORAL
  Filled 2021-03-04 (×5): qty 1

## 2021-03-04 NOTE — Progress Notes (Signed)
  Echocardiogram 2D Echocardiogram has been performed.  Thomas Gardner 03/04/2021, 3:30 PM

## 2021-03-04 NOTE — Progress Notes (Signed)
Subjective: Patient evaluated at bedside.  He sitting up in bed and eating breakfast.  Does have some small amount of dizziness when he gets up.  He is tolerating food and denies any vomiting today.  Discussed that he may need cardiology evaluation for ischemic work-up with his EF remains low.  He would prefer this be done prior to discharge rather than as  an outpatient.  Objective:  Vital signs in last 24 hours: Vitals:   03/03/21 1748 03/03/21 2133 03/04/21 0204 03/04/21 0641  BP: (!) 176/97 (!) 161/70 136/75 (!) 151/78  Pulse: 86 80 77 75  Resp: 17 19 18 15   Temp: 98.1 F (36.7 C)     TempSrc: Oral     SpO2: 98% 99% 98% 98%  Weight:      Height:       Constitutional: Appears well-developed and well-nourished. No distress.  HENT: Normocephalic and atraumatic, EOMI, conjunctiva normal, moist mucous membranes Cardiovascular: Normal rate, regular rhythm, S1 and S2 present, no murmurs, rubs, gallops.  Distal pulses intact Respiratory: No respiratory distress, no accessory muscle use.  Effort is normal.  Lungs are clear to auscultation bilaterally. GI: Nondistended, soft, nontender to palpation, normal active bowel sounds Musculoskeletal: No peripheral edema, moving all extremities neurological: Is alert and oriented x4, moving all extremities, speech is clear and fluent Skin: Warm and dry.  No rash, erythema, lesions noted.  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  Symptoms are much improved today.  Risk factors include uncontrolled diabetes and hyperlipidemia.  Currently awaiting echo and evaluation with PT and OT. -Appreciate neurology recommendation -Follow-up with  echocardiogram, will consult cardiology for ischemic eval if EF remains low -Aspirin 81mg  and Plavix for 21 days then aspirin alone -Continue losartan 100 mg daily, will gradually work on normalizing blood pressure - neuro checks - PT/OT   Dyspnea on exertion Chronic HFrEF Mains euvolemic on exam.  Last echo in7/13/2022 with EF of 35 to 40%, global hypokinesis, LV H, grade 1 diastolic dysfunction.  Previously saw Dr. Einar Gip but stopped when he lost his insurance.  Does recall having heart catheterization many years ago but cannot recall the results. -Continue losartan, Jardiance -Follow-up echocardiogram -Will discuss with cardiology if EF remains low  Type 2 diabetes A1c of 9.9.  Currently on Lantus 20 units twice daily with humalog 18 units 3 times daily.  CBGs in 149 this morning.  Unable to administer Victoza while in the hospital -Levemir 15 units twice daily -NovoLog 5 units 3 times daily -Sliding scale insulin -Continue Jardiance 25 mg daily -Metformin 500 mg twice daily   Hyperlipidemia Last LDL 220 in December.  Was supposed to be on Crestor and Zetia stopped due to side effects of one of the medications.  Cholesterol 340, LDL 271, triglycerides 182.  Tolerating Crestor. -Crestor 40 mg daily -Outpatient PCSK9 inhibitors if cholesterol remains high  Hypertension Somewhat improved BPs 150s this morning -Continue losartan -Slowly correct BP over the next few days   Thyroid nodule TSH within normal limits.  Complex cystic mass in the midline superficial to the thyroid isthmus appears increased since imaging in 2021. -We will need outpatient FNA and ENT referral  Prior to Admission Living  Arrangement:home Anticipated Discharge Location: pending pt/ot eval Barriers to Discharge: stroke work up, pt/ot Dispo: Anticipated discharge in approximately 1-2 day(s).   Iona Beard, MD 03/04/2021, 10:21 AM Pager: [phone] After 5pm on weekdays and 1pm on weekends: On Call pager  [phone]

## 2021-03-04 NOTE — Plan of Care (Signed)
Pt admitted from ED s/p ischemic stroke outside window for TPA. He is neurologically intact presenting with some generalized weakness that has been improving. RA set up on telemetry. Core measures in place pending consult for OT, PT and Swallow eval.  Fall precautions in place. Admission assessment and Care plan created.    Problem: Coping: Goal: Will verbalize positive feelings about self Outcome: Not Applicable Goal: Will identify appropriate support needs Outcome: Not Applicable   Problem: Health Behavior/Discharge Planning: Goal: Ability to manage health-related needs will improve Outcome: Not Applicable   Problem: Self-Care: Goal: Ability to participate in self-care as condition permits will improve Outcome: Not Applicable Goal: Verbalization of feelings and concerns over difficulty with self-care will improve Outcome: Not Applicable Goal: Ability to communicate needs accurately will improve Outcome: Not Applicable   Problem: Nutrition: Goal: Risk of aspiration will decrease Outcome: Not Applicable

## 2021-03-04 NOTE — Progress Notes (Addendum)
STROKE TEAM PROGRESS NOTE   ATTENDING NOTE: I reviewed above note and agree with the assessment and plan. Pt was seen and examined.   No acute event overnight.  Patient neuro stable, lying in bed, no complaints.  2D echo showed EF 50%, much improved from 2 months ago.  PT/OT still pending.  Recommend DAPT for 3 weeks and then aspirin alone.  Continue Crestor.  Stroke risk factor modification  For detailed assessment and plan, please refer to above as I have made changes wherever appropriate.   Neurology will sign off. Please call with questions. Pt will follow up with stroke clinic NP at [facility] in about 4 weeks. Thanks for the consult.   Rosalin Hawking, MD PhD Stroke Neurology 03/04/2021 6:11 PM    INTERVAL HISTORY No acute events since arrival. No visitors at bedside. Today he reports he is feeling much better with lightheaded sensation almost resolved. He noticed it a couple of times today when getting up to bathroom. He feels it is related to his blood glucose being lower than normal as his blood glucose is usually above 200 at home and he feels that is normal for him. Otherwise he has no new concerns today.  We discussed his stroke work up, expectations for normal blood glucose. He had no questions.   Vitals:   03/03/21 1748 03/03/21 2133 03/04/21 0204 03/04/21 0641  BP: (!) 176/97 (!) 161/70 136/75 (!) 151/78  Pulse: 86 80 77 75  Resp: 17 19 18 15   Temp: 98.1 F (36.7 C)     TempSrc: Oral     SpO2: 98% 99% 98% 98%  Weight:      Height:       CBC:  Recent Labs  Lab 03/01/21 1317 03/02/21 1801  WBC 8.4 10.1  NEUTROABS 5.5 7.3  HGB 13.9 14.4  HCT 43.9 44.3  MCV 82.8 80.8  PLT 203 532   Basic Metabolic Panel:  Recent Labs  Lab 03/02/21 1801 03/04/21 0416  NA 138 138  K 3.7 3.8  CL 101 101  CO2 28 29  GLUCOSE 266* 149*  BUN 27* 18  CREATININE 1.20 1.20  CALCIUM 9.6 9.0   Lipid Panel:  Recent Labs  Lab 03/03/21 0500  CHOL 340*  TRIG 182*  HDL 33*  CHOLHDL  10.3  VLDL 36  LDLCALC 271*   HgbA1c:  Recent Labs  Lab 03/03/21 0500  HGBA1C 9.9*   Urine Drug Screen:  Recent Labs  Lab 03/01/21 1641  LABOPIA POSITIVE*  COCAINSCRNUR NONE DETECTED  LABBENZ NONE DETECTED  AMPHETMU NONE DETECTED  THCU NONE DETECTED  LABBARB NONE DETECTED    Alcohol Level No results for input(s): ETH in the last 168 hours.  IMAGING past 24 hours No results found.  PHYSICAL EXAM  Temp:  [98.1 F (36.7 C)-98.5 F (36.9 C)] 98.1 F (36.7 C) (10/15 1748) Pulse Rate:  [75-86] 75 (10/16 0641) Resp:  [15-19] 15 (10/16 0641) BP: (136-197)/(61-97) 151/78 (10/16 0641) SpO2:  [98 %-100 %] 98 % (10/16 0641)  General - Well nourished, well developed, in no apparent distress, sitting up on side of stretcher eating with good appetite.   Ophthalmologic - fundi not visualized due to noncooperation.  Cardiovascular - Regular rhythm and rate on bedside monitor.  Mental Status -  Level of arousal and orientation to time, place, and person were intact. Language including expression, naming, repetition, comprehension was assessed and found intact. Attention span and concentration were normal. Recent and remote memory were intact.  Fund of Knowledge was assessed and was intact.  Cranial Nerves II - XII - II - Visual field intact OU. III, IV, VI - Extraocular movements intact. V - Facial sensation intact bilaterally. VII - Facial movement intact bilaterally. VIII - Hearing & vestibular intact bilaterally. X - Palate elevates symmetrically. XI - Chin turning & shoulder shrug intact bilaterally. XII - Tongue protrusion intact.  Motor Strength - The patient's strength was normal in all extremities except left hip flexor which was 4/5 and pronator drift was absent.  Bulk was normal and fasciculations were absent.   Motor Tone - Muscle tone was assessed at the neck and appendages and was normal.  Sensory - Light touch intact V1-V3 and x4 extremities.   Coordination  - The patient had normal movements in the hands and feet with no ataxia or dysmetria.  Tremor was absent.  Gait and Station - deferred.   ASSESSMENT/PLAN Thomas Gardner is a 65 y.o. male with PMH significant for DM2, HLD, HTN, Morbid obesity, OSA who presents with vertigo that started on the evening of 02/28/21. Also has posterior headache that started after wisdom tooth was removed. Initially came to the ED on 03/01/21 and MRI Was read as normal. It does show a small left meduallary stroke on DWI imaging. He came in today with intense persistent dizziness. Has not taken meclizine. Review of the MRI demonstrated the L mdeullary stroke and so he was sent for further management of stroke and stroke workup. Vertigo is much better after he was given meds in the ED.   Does not smoke, no EtOH, no recreational drugs. Father had stroke. Reports his diabetes and cholesterol are poorly controlled.  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  Code Stroke CT head: No acute intracranial abnormalities. Mild chronic cerebral atrophy and small vessel ischemic changes. CTA head & neck  1. No emergent large vessel occlusion. 2. 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 3. Severe stenosis of the proximal left external carotid artery. 4. 3.6 cm incidental right thyroid nodule.  MRI   Punctate focus of abnormal diffusion restriction in the left medullary pyramid. This may represent a small focus of acute ischemia. 2D Echo  1. Left ventricular ejection fraction, by estimation, is 50%. The left  ventricle has low normal function. The left ventricle has no regional wall  motion abnormalities. There is mild left ventricular hypertrophy. Left  ventricular diastolic parameters are   indeterminate.   2. Right ventricular systolic function is normal. The right ventricular  size is normal. Tricuspid regurgitation signal is inadequate for  assessing  PA pressure.   3. The mitral valve is normal in structure. Trivial mitral valve  regurgitation. No evidence of mitral stenosis.   4. The aortic valve is grossly normal. Aortic valve regurgitation is not  visualized. No aortic stenosis is present.   5. The inferior vena cava is normal in size with greater than 50%  respiratory variability, suggesting right atrial pressure of 3 mmHg.  No atrial level shunt detected by color flow Doppler. LDL 271 HgbA1c 9.9 VTE prophylaxis - recommended     Diet   Diet Carb Modified Fluid consistency: Thin; Room service appropriate? Yes   Not on anticoagulant or antiplatelet prior to admission Recommed DAPT with Plavix 75mg  and ASA 81 mg x 3 weeks then ASA monotherapy Therapy recommendations:  PENDING Disposition:  TBD       ICS Multifocal moderate-to-severe stenosis of the right  posterior     cerebral artery P2-P3 segments. Avoid hypotension        HRrER 2D Echo with improved EF 50% Recently had TTE in July 2022 with EF of 35-40%, no mention of PFO IM planning to refer to cardiology to establish care, further evaluation for ischemic cardiomyopathy work up  Hypertension Stable Permissive hypertension (OK if < 220/120) but gradually normalize in 5-7 days Avoid hypotension  Long-term BP goal normotensive  Hyperlipidemia Home meds:  Crestor 40mg , Zetia 10mg   LDL 271, goal < 70 High intensity statin: continue home regimen  Continue statin at discharge Close PCP follow up   Diabetes type II Uncontrolled HgbA1c 9.9, goal < 7.0 CBGs Recent Labs    03/03/21 1744 03/03/21 2118 03/04/21 0758  GLUCAP 214* 199* 172*    Management per primary team  Close PCP follow up for management   Other Stroke Risk Factors Advanced Age >/= 65  Obesity, Body mass index is 40.28 kg/m., BMI >/= 30 associated with increased stroke risk, recommend weight loss, diet and exercise as appropriate  Family hx stroke (father) High risk for Obstructive  sleep apnea  Other Active Problems  Thyroid nodule eval: outpatient referral to ENT planned   Hospital day # 1  This plan of care was directed by [name].  Hetty Blend, NP-C   To contact Stroke Continuity provider, please refer to [email or web address] After hours, contact General Neurology

## 2021-03-05 ENCOUNTER — Telehealth: Payer: Self-pay | Admitting: Internal Medicine

## 2021-03-05 DIAGNOSIS — I63013 Cerebral infarction due to thrombosis of bilateral vertebral arteries: Secondary | ICD-10-CM

## 2021-03-05 LAB — CBC
HCT: 42.2 % (ref 39.0–52.0)
Hemoglobin: 13.6 g/dL (ref 13.0–17.0)
MCH: 26.3 pg (ref 26.0–34.0)
MCHC: 32.2 g/dL (ref 30.0–36.0)
MCV: 81.5 fL (ref 80.0–100.0)
Platelets: 210 10*3/uL (ref 150–400)
RBC: 5.18 MIL/uL (ref 4.22–5.81)
RDW: 14.3 % (ref 11.5–15.5)
WBC: 9.7 10*3/uL (ref 4.0–10.5)
nRBC: 0 % (ref 0.0–0.2)

## 2021-03-05 LAB — BASIC METABOLIC PANEL
Anion gap: 7 (ref 5–15)
BUN: 21 mg/dL (ref 8–23)
CO2: 28 mmol/L (ref 22–32)
Calcium: 8.9 mg/dL (ref 8.9–10.3)
Chloride: 101 mmol/L (ref 98–111)
Creatinine, Ser: 1.32 mg/dL — ABNORMAL HIGH (ref 0.61–1.24)
GFR, Estimated: 60 mL/min — ABNORMAL LOW (ref >60)
Glucose, Bld: 147 mg/dL — ABNORMAL HIGH (ref 70–99)
Potassium: 3.6 mmol/L (ref 3.5–5.1)
Sodium: 136 mmol/L (ref 135–145)

## 2021-03-05 LAB — GLUCOSE, CAPILLARY
Glucose-Capillary: 170 mg/dL — ABNORMAL HIGH (ref 70–99)
Glucose-Capillary: 274 mg/dL — ABNORMAL HIGH (ref 70–99)
Glucose-Capillary: 186 mg/dL — ABNORMAL HIGH (ref 70–99)
Glucose-Capillary: 151 mg/dL — ABNORMAL HIGH (ref 70–99)
Glucose-Capillary: 186 mg/dL — ABNORMAL HIGH (ref 70–99)

## 2021-03-05 MED ORDER — CHLORTHALIDONE 25 MG PO TABS
25.0000 mg | ORAL_TABLET | Freq: Every day | ORAL | Status: DC
  Administered 2021-03-05 – 2021-03-07 (×3): 25 mg via ORAL
  Filled 2021-03-05 (×3): qty 1

## 2021-03-05 NOTE — Evaluation (Signed)
Occupational Therapy Evaluation Patient Details Name: Thomas Gardner MRN: [id number] DOB: Oct 03, 1955 Today's Date: 03/05/2021   History of Present Illness Thomas Gardner is a 65 year old gentleman who was seen in the ED for dizziness and found to have an acute infarct in the left medullary pyramid PMH:  HFrEF (EF 35%), uncontrolled type 2 diabetes, hypertension, hyperlipidemia   Clinical Impression   PTA patient independent and working. Admitted for above and presenting with problem list below, including L sided weakness and impaired coordination, dizziness, and impaired balance.  He requires up to min assist for transfers and in room mobility using RW, up to min assist for ADLs. Cueing for safety due to L lateral lean with fatigue, L knee buckling and RW mgmt. He reports plan to have support from friends at dc.  Believe he will benefit from further OT services while admitted and after dc at CIR level to optimize independence and return to PLOF.  Anticipate he will progress to modified independence level. Will follow.      Recommendations for follow up therapy are one component of a multi-disciplinary discharge planning process, led by the attending physician.  Recommendations may be updated based on patient status, additional functional criteria and insurance authorization.   Follow Up Recommendations  CIR    Equipment Recommendations  Other (comment) (TBD)    Recommendations for Other Services Rehab consult     Precautions / Restrictions Precautions Precautions: Fall Precaution Comments: tremors/shakiness Restrictions Weight Bearing Restrictions: No      Mobility Bed Mobility Overal bed mobility: Needs Assistance Bed Mobility: Supine to Sit     Supine to sit: Supervision     General bed mobility comments: EOB upon entry    Transfers Overall transfer level: Needs assistance Equipment used: Rolling walker (2 wheeled);None Transfers: Sit to/from Stand Sit to Stand: Min  guard         General transfer comment: min guard for safety/balance    Balance Overall balance assessment: Needs assistance Sitting-balance support: Feet supported;No upper extremity supported Sitting balance-Leahy Scale: Good     Standing balance support: Bilateral upper extremity supported;During functional activity;No upper extremity supported Standing balance-Leahy Scale: Poor Standing balance comment: pt relies on BUE support dynamically, L lateral lean with fatigue                           ADL either performed or assessed with clinical judgement   ADL Overall ADL's : Needs assistance/impaired     Grooming: Standing;Minimal assistance           Upper Body Dressing : Minimal assistance;Sitting   Lower Body Dressing: Minimal assistance;Sit to/from stand Lower Body Dressing Details (indicate cue type and reason): able to don/doff socks, min assist in standing Toilet Transfer: Minimal assistance;Ambulation;RW;Grab bars           Functional mobility during ADLs: Minimal assistance;Rolling walker General ADL Comments: pt limited by L sided weakness and coordination, impaired balance     Vision Baseline Vision/History: 1 Wears glasses (reading) Ability to See in Adequate Light: 0 Adequate Patient Visual Report: Blurring of vision Vision Assessment?: No apparent visual deficits Additional Comments: brief assessment, blurry vision reading clock but otherwise [facility]     Perception     Praxis      Pertinent Vitals/Pain Pain Assessment: 0-10 Pain Score: 3  Pain Location: headache Pain Descriptors / Indicators: Headache Pain Intervention(s): Monitored during session;Repositioned     Hand Dominance Right   Extremity/Trunk  Assessment Upper Extremity Assessment Upper Extremity Assessment: LUE deficits/detail LUE Deficits / Details: grossly 4-/5 (mildly weaker) with decreased coordination LUE Sensation: WNL LUE Coordination: decreased gross  motor;decreased fine motor   Lower Extremity Assessment Lower Extremity Assessment: Defer to PT evaluation LLE Deficits / Details: pt with noted buckling during standing and walking, grossly 4/5 LLE Sensation: decreased light touch LLE Coordination: decreased gross motor   Cervical / Trunk Assessment Cervical / Trunk Assessment: Normal (obesity)   Communication Communication Communication: No difficulties   Cognition Arousal/Alertness: Awake/alert Behavior During Therapy: WFL for tasks assessed/performed Overall Cognitive Status: Within Functional Limits for tasks assessed                                 General Comments: pt became emotional during PT eval stating "it's hard" regarding his impaired mobility   General Comments  BP 187/95, taken by RN when pt had to sit between amb bouts    Exercises Exercises: Other exercises Other Exercises Other Exercises: reviewed exercises with pt: tip to tip (increased speed), finger extension isolation and ab/addcution.  encouraged pt to utilize L UE as dominant UE.   Shoulder Instructions      Home Living Family/patient expects to be discharged to:: Private residence Living Arrangements: Alone Available Help at Discharge: Friend(s);Available PRN/intermittently Type of Home: House Home Access: Stairs to enter Entrance Stairs-Number of Steps: 2 Entrance Stairs-Rails: None Home Layout: One level     Bathroom Shower/Tub: Teacher, early years/pre: Handicapped height     Home Equipment: Grab bars - tub/shower;Grab bars - toilet   Additional Comments: pt reports he is moving into his parents (they're deceased) house and can't go back to his apartment because there are too many boxes. 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)        OT  Problem List: Decreased strength;Decreased activity tolerance;Impaired balance (sitting and/or standing);Decreased coordination;Decreased knowledge of use of DME or AE;Decreased knowledge of precautions;Obesity;Impaired UE functional use      OT Treatment/Interventions: Self-care/ADL training;Neuromuscular education;DME and/or AE instruction;Therapeutic activities;Balance training;Patient/family education    OT Goals(Current goals can be found in the care plan section) Acute Rehab OT Goals Patient Stated Goal: get back to independent OT Goal Formulation: With patient Time For Goal Achievement: 03/19/21 Potential to Achieve Goals: Good  OT Frequency: Min 2X/week   Barriers to D/C:            Co-evaluation              AM-PAC OT "6 Clicks" Daily Activity     Outcome Measure Help from another person eating meals?: A Little Help from another person taking care of personal grooming?: A Little Help from another person toileting, which includes using toliet, bedpan, or urinal?: A Little Help from another person bathing (including washing, rinsing, drying)?: A Little Help from another person to put on and taking off regular upper body clothing?: A Little Help from another person to put on and taking off regular lower body clothing?: A Little 6 Click Score: 18   End of Session Equipment Utilized During Treatment: Rolling walker Nurse Communication: Mobility status  Activity Tolerance: Patient tolerated treatment well Patient left: with call bell/phone within reach;Other (comment) (sitting EOB)  OT Visit  Diagnosis: Other abnormalities of gait and mobility (R26.89);Muscle weakness (generalized) (M62.81);Other symptoms and signs involving the nervous system (R29.898)                Time: 9802-2179 OT Time Calculation (min): 20 min Charges:  OT General Charges $OT Visit: 1 Visit OT Evaluation $OT Eval Moderate Complexity: 1 Mod  Jolaine Artist, OT Acute Rehabilitation Services Pager  [phone] Office [phone]   Delight Stare 03/05/2021, 9:16 AM

## 2021-03-05 NOTE — Telephone Encounter (Signed)
Things That May Be Affecting Your Health:  Alcohol  Hearing loss  Pain   x Depression  Home Safety  Sexual Health  x Diabetes  Lack of physical activity  Stress   Difficulty with daily activities  Loneliness  Tiredness   Drug use  Medicines  Tobacco use   Falls  Motor Vehicle Safety x Weight  x Food choices  Oral Health  Other    YOUR PERSONALIZED HEALTH PLAN : 1. Schedule your next subsequent Medicare Wellness visit in one year 2. Attend all of your regular appointments to address your medical issues 3. Complete the preventative screenings and services   Annual Wellness Visit   Medicare Covered Preventative Screenings and [place] Men and Women Who How Often Need? Date of Last Service Action  Abdominal Aortic Aneurysm Adults with AAA risk factors Once  @HMIMMADMINDATE ([id number])@    Alcohol Misuse and Counseling All Adults Screening once a year if no alcohol misuse. Counseling up to 4 face to face sessions. y    Bone Density Measurement  Adults at risk for osteoporosis Once every 2 yrs  @HMIMMADMINDATE ([id number])@    Lipid Panel Z13.6 All adults without CV disease Once every 5 yrs  @HMIMMADMINDATE ([id number])@     Colorectal Cancer  Stool sample or Colonoscopy All adults 78 and older  Once every year Every 41 years y @HMIMMADMINDATE ([id number])@ @HMIMMADMINDATE ([id number])@ @HMIMMADMINDATE ([id number])@    Depression All Adults Once a year  Today   Diabetes Screening Blood glucose, post glucose load, or GTT Z13.1 All adults at risk Pre-diabetics Once per year Twice per year  @HMIMMADMINDATE ([id number])@    Diabetes  Self-Management Training All adults Diabetics 10 hrs first year; 2 hours subsequent years. Requires Copay y    Glaucoma Diabetics Family history of glaucoma African Americans 87 yrs + Hispanic Americans 66 yrs + Annually - requires coppay y @HMIMMADMINDATE ([id number])@    Hepatitis C Z72.89 or F19.20 High Risk for HCV Born  between 1945 and 1965 Annually Once      HIV Z11.4 All adults based on risk Annually btw ages 84 & 75 regardless of risk Annually > 65 yrs if at increased risk      Lung Cancer Screening Asymptomatic adults aged 38-77 with 30 pack yr history and current smoker OR quit within the last 15 yrs Annually Must have counseling and shared decision making documentation before first screen      Medical Nutrition Therapy Adults with  Diabetes Renal disease Kidney transplant within past 3 yrs 3 hours first year; 2 hours subsequent years y    Obesity and Counseling All adults Screening once a year Counseling if BMI 30 or higher y Today   Tobacco Use Counseling Adults who use tobacco  Up to 8 visits in one year     Vaccines Z23 Hepatitis B Influenza  Pneumonia  Adults  Once Once every flu season Two different vaccines separated by one year y    Next Annual Wellness Visit People with Medicare Every year  Today     Services & Screenings Women Who How Often Need  Date of Last Service Action  Mammogram  Z12.31 Women over 67 One baseline ages 58-39. Annually ager 40 yrs+      Pap tests All women Annually if high risk. Every 2 yrs for normal risk women      Screening for cervical cancer with  Pap (Z01.419 nl or Z01.411abnl) & HPV Z11.51 Women aged 55 to 68 Once every 5 yrs  Screening pelvic and breast exams All women Annually if high risk. Every 2 yrs for normal risk women     Sexually Transmitted Diseases Chlamydia Gonorrhea Syphilis All at risk adults Annually for non pregnant females at increased risk         [place] Men Who How Ofter Need  Date of Last Service Action  Prostate Cancer - DRE & PSA Men over 50 Annually.  DRE might require a copay. y       Sexually Transmitted Diseases Syphilis All at risk adults Annually for men at increased risk      Health Maintenance List Health Maintenance  Topic Date Due   COVID-19 Vaccine (1) Never done   TETANUS/TDAP   Never done   COLONOSCOPY (Pts 45-65yrs Insurance coverage will need to be confirmed)  Never done   Zoster Vaccines- Shingrix (1 of 2) Never done   OPHTHALMOLOGY EXAM  06/23/2019   FOOT EXAM  04/18/2020   INFLUENZA VACCINE  Never done   HEMOGLOBIN A1C  09/01/2021   Hepatitis C Screening  Completed   HIV Screening  Completed   HPV VACCINES  Aged Out

## 2021-03-05 NOTE — Progress Notes (Signed)
Inpatient Rehab Admissions Coordinator:   Per therapy recommendations,  patient was screened for CIR candidacy by Clemens Catholic, MS, CCC-SLP. At this time, Pt. Appears to demonstrate medical necessity, functional decline, and ability to tolerate intensity of CIR. Pt. is a potential candidate for CIR. I will place  order for rehab consult per protocol for full assessment. Please contact me any with questions.  Clemens Catholic, [place], [place] Admissions Coordinator  [phone] ([place]) [phone] (office)

## 2021-03-05 NOTE — Evaluation (Signed)
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 Pt now presenting with L sides weakness, impaired co-ordination, dizziness, and impaired balance requiring minA for safe mobility. Pt now requires RW and remains to have L knee buckling and tremors with amb >15' requiring seated rest break. Pt reports he is in the process of moving into his deceased parents home which I one level so he would need to stay with his friends upon d/c. Recommend CIR upon d/c to achieve safe mod I level of function for safe transition to friends home. Pt demo's excellent rehab potential to achieve safe mod I. Acute PT to cont to follow.       Recommendations for follow up therapy are one component of a multi-disciplinary discharge planning process, led by the attending physician.  Recommendations may be updated based on patient status, additional functional criteria and insurance authorization.  Follow Up Recommendations CIR    Equipment Recommendations  Rolling walker with 5" wheels    Recommendations for Other Services Rehab consult     Precautions / Restrictions Precautions Precautions: Fall Precaution Comments: tremors/shakiness Restrictions Weight Bearing Restrictions: No      Mobility  Bed Mobility Overal bed mobility: Needs Assistance Bed Mobility: Supine to Sit     Supine to sit: Supervision     General bed mobility comments: pt received in L sidelying, pt used bed rail to pull self up, increased time, mild dizziness with change in position    Transfers Overall transfer level: Needs assistance Equipment  used: Rolling walker (2 wheeled);None Transfers: Sit to/from Stand Sit to Stand: Min guard         General transfer comment: pt completed first 2 transfers without RW reuqiring minA to power up and steady, when given RW pt required verbal cues to push up from bed and not pull up on walker, min guard to steady during transition of hands, pt with wide base of support, pt used rail in bathroom to pull self up off toliet  Ambulation/Gait Ambulation/Gait assistance: Min assist;+2 safety/equipment (chair follow) Gait Distance (Feet): 15 Feet (x2, 10x1) Assistive device: 1 person hand held assist;Rolling walker (2 wheeled) Gait Pattern/deviations: Step-through pattern;Decreased stride length;Decreased stance time - left Gait velocity: very slow and guarded Gait velocity interpretation: <1.8 ft/sec, indicate of risk for recurrent falls General Gait Details: pt first ambulated to bathroom with L HHA transitioning to furninture walking, then trialed RW in which pt demo'd improved stability however once in hallway pt with onset of fatigue, headache, and dizziness with noted shakiness and L knee buckling requiring pt to sit and rest prior to returning to room, minA for walker management, significant increase in time  Stairs            Wheelchair Mobility    Modified Rankin (Stroke Patients Only)       Balance Overall balance assessment: Needs assistance Sitting-balance support: Feet supported;No upper extremity supported Sitting balance-Leahy Scale: Good     Standing balance support: Single extremity supported;During functional activity Standing balance-Leahy Scale: Poor Standing balance comment: pt stood at since for 5 min to brush teeth, pt leaned up against the sink  with his trunk to stabilize while putting tooth past one, pt the transitioned to supporting self with L hand on counter and leaning on counter with trunk to stabilize self while brushing teeth. Pt with 2 L knee buckles  requiring min/modA to prevent fall. pt had difficulty washing face with both hands as pt unsteady standing without UE support                             Pertinent Vitals/Pain Pain Assessment: 0-10 Pain Score: 3  Pain Location: headache Pain Descriptors / Indicators: Headache Pain Intervention(s): Monitored during session    Home Living Family/patient expects to be discharged to:: Private residence Living Arrangements: Alone Available Help at Discharge: Friend(s);Available PRN/intermittently Type of Home: House Home Access: Stairs to enter Entrance Stairs-Rails: None Entrance Stairs-Number of Steps: 2 Home Layout: One level   Additional Comments: pt reports he is moving into his parents (they're deceased) house and can't go back to his apartment because there are too many boxes. Pt states he would go home with 2 of his friends    Prior Function Level of Independence: Independent               Hand Dominance   Dominant Hand: Right    Extremity/Trunk Assessment   Upper Extremity Assessment Upper Extremity Assessment: LUE deficits/detail LUE Deficits / Details: mildly weaker than the left but noted impaired co-ordination with finger to nose test LUE Coordination: decreased gross motor;decreased fine motor    Lower Extremity Assessment Lower Extremity Assessment: LLE deficits/detail LLE Deficits / Details: pt with noted buckling during standing and walking, grossly 4/5 LLE Sensation: decreased light touch LLE Coordination: decreased gross motor    Cervical / Trunk Assessment Cervical / Trunk Assessment: Normal (obesity)  Communication   Communication: No difficulties  Cognition Arousal/Alertness: Awake/alert Behavior During Therapy: WFL for tasks assessed/performed Overall Cognitive Status: Within Functional Limits for tasks assessed                                 General Comments: pt became emotional during PT eval stating "it's hard"  regarding his impaired mobility      General Comments General comments (skin integrity, edema, etc.): BP 187/95, taken by RN when pt had to sit between amb bouts    Exercises     Assessment/Plan    PT Assessment Patient needs continued PT services  PT Problem List Decreased strength;Decreased range of motion;Decreased activity tolerance;Decreased balance;Decreased mobility;Decreased coordination;Decreased cognition;Decreased knowledge of use of DME       PT Treatment Interventions      PT Goals (Current goals can be found in the Care Plan section)  Acute Rehab PT Goals Patient Stated Goal: get strongrt PT Goal Formulation: With patient Time For Goal Achievement: 03/19/21 Potential to Achieve Goals: Good    Frequency Min 4X/week   Barriers to discharge Decreased caregiver support lives alone but states he can live with his friends who can provide intermittent supervision    Co-evaluation               AM-PAC PT "6 Clicks" Mobility  Outcome Measure Help needed turning from your back to your side while in a flat bed without using bedrails?: A Little Help needed moving from lying on your back to sitting on the side of a flat bed without using bedrails?: A Little Help needed moving  to and from a bed to a chair (including a wheelchair)?: A Little Help needed standing up from a chair using your arms (e.g., wheelchair or bedside chair)?: A Little Help needed to walk in hospital room?: A Lot Help needed climbing 3-5 steps with a railing? : Total 6 Click Score: 15    End of Session Equipment Utilized During Treatment: Gait belt Activity Tolerance: Treatment limited secondary to medical complications (Comment) (limited by dizziness and onset of headache with mobility) Patient left: in bed;with call bell/phone within reach;with nursing/sitter in room (sitting EOB, talking with internal medicine team) Nurse Communication: Mobility status (RN present and took BP) PT Visit  Diagnosis: Unsteadiness on feet (R26.81);Muscle weakness (generalized) (M62.81);Difficulty in walking, not elsewhere classified (R26.2)    Time: 0104-0459 PT Time Calculation (min) (ACUTE ONLY): 29 min   Charges:   PT Evaluation $PT Eval Moderate Complexity: 1 Mod PT Treatments $Gait Training: 8-22 mins        Kittie Plater, PT, DPT Acute Rehabilitation Services Pager #: [phone] Office #: [phone]   Berline Lopes 03/05/2021, 8:34 AM

## 2021-03-05 NOTE — Evaluation (Signed)
Speech Language Pathology Evaluation Patient Details Name: Thomas Gardner MRN: [id number] DOB: September 17, 1955 Today's Date: 03/05/2021 Time: 7001-7494 SLP Time Calculation (min) (ACUTE ONLY): 12 min  Problem List:  Patient Active Problem List   Diagnosis Date Noted   CVA (cerebral vascular accident) ([place]) 03/03/2021   Post-nasal drip 08/18/2020   Fatigue 04/26/2020   Bilateral lower extremity edema 04/26/2020   Epidermal inclusion cyst 04/03/2020   Generalized anxiety disorder 03/27/2020   Neck mass 01/19/2020   Epigastric pain 04/20/2019   Diabetes mellitus due to underlying condition with hyperosmolarity without coma, with long-term current use of insulin ([place]) 04/19/2019   History of phimosis of penis 04/07/2018   Candidal balanitis 04/07/2018   Rib pain on right side 08/07/2016   Sciatica associated with disorder of lumbosacral spine 08/07/2016   Leg cramping 07/11/2016   Sleep apnea 02/01/2016   Coccyalgia 01/19/2016   Depressed mood 01/19/2016   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   Healthcare maintenance 03/17/2015   Gout 08/08/2014   Hyperlipidemia 06/15/2014   GI bleed 05/03/2014   Congestive heart failure ([place]) 05/03/2014   Diabetes mellitus ([place]) 05/03/2014   Essential hypertension 05/03/2014   Morbid obesity ([place]) 10/10/2011   Past Medical History:  Past Medical History:  Diagnosis Date   Arthritis    Asthma    CHF (congestive heart failure) ([place])    Diabetes mellitus 2003   GERD (gastroesophageal reflux disease)    Hyperlipidemia    Hypertension    Morbid obesity ([place])    Sleep apnea    Past Surgical History:  Past Surgical History:  Procedure Laterality Date   BREATH TEK H PYLORI  11/11/2011   Procedure: BREATH TEK H PYLORI;  Surgeon: Shann Medal, MD;  Location: [facility];  Service: General;  Laterality: N/A;   CARDIAC CATHETERIZATION     ESOPHAGOGASTRODUODENOSCOPY (EGD) WITH PROPOFOL N/A 05/04/2014   Procedure: ESOPHAGOGASTRODUODENOSCOPY  (EGD) WITH PROPOFOL;  Surgeon: Cleotis Nipper, MD;  Location: [facility] ENDOSCOPY;  Service: Endoscopy;  Laterality: N/A;   HPI:  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 Output is fluent. Expressive and receptive language are WNL. Attention, memory, and problem solving WNL. No SLP f/u is needed. Our service will sign off.    SLP Assessment  SLP Recommendation/Assessment: Patient does not need any further Speech [facility] Pathology Services SLP Visit Diagnosis: Cognitive communication deficit (R41.841)    Recommendations for follow up therapy are one component of a multi-disciplinary discharge planning process, led by the attending physician.  Recommendations may be updated based on patient status, additional functional criteria and insurance authorization.    Follow Up Recommendations  None    Frequency and Duration           SLP Evaluation Cognition  Overall Cognitive Status: Within Functional Limits for tasks assessed Arousal/Alertness: Awake/alert Orientation Level: Oriented X4 Attention: Selective Selective Attention: Appears intact Memory: Appears intact Awareness: Appears intact Problem Solving: Appears intact       Comprehension  Auditory Comprehension Overall Auditory Comprehension: Appears within functional limits for tasks assessed Visual Recognition/Discrimination Discrimination: Within Function Limits Reading Comprehension Reading Status: Within funtional limits (large print)    Expression Expression Primary Mode of Expression: Verbal Verbal Expression Overall Verbal Expression:  Appears within functional limits for tasks assessed Written Expression Dominant Hand:  Right   Oral / Motor  Oral Motor/Sensory Function Overall Oral Motor/Sensory Function: Within functional limits Motor Speech Overall Motor Speech: Appears within functional limits for tasks assessed   GO                   Thomas Gardner L. Tivis Ringer, [place] CCC/SLP Acute Rehabilitation Services Office number [phone] Pager [phone]  Thomas Gardner 03/05/2021, 12:01 PM

## 2021-03-05 NOTE — Progress Notes (Signed)
Inpatient Diabetes Program Recommendations  AACE/ADA: New Consensus Statement on Inpatient Glycemic Control (2015)  Target Ranges:  Prepandial:   less than 140 mg/dL      Peak postprandial:   less than 180 mg/dL (1-2 hours)      Critically ill patients:  140 - 180 mg/dL   Lab Results  Component Value Date   GLUCAP 186 (H) 03/05/2021   HGBA1C 9.9 (H) 03/03/2021    Review of Glycemic Control Results for IZSAK, MEIR (MRN [id number]) as of 03/05/2021 13:36  Ref. Range 03/04/2021 15:30 03/04/2021 20:36 03/05/2021 08:07 03/05/2021 11:24 03/05/2021 12:52  Glucose-Capillary Latest Ref Range: 70 - 99 mg/dL 227 (H) 154 (H) 170 (H) 274 (H) 186 (H)   Diabetes history: DM 2 Outpatient Diabetes medications:  Lantus 20 units bid, Jardiance 10 mg daily, Humalog 18 units tid with meals Victoza 1.8 mg daily Current orders for Inpatient glycemic control:  Novolog moderate tid with meals and HS Levemir 15 units bid Jardiance 25 mg daily Novolog 5 units tid with meals Metformin XR 500 mg bid  Inpatient Diabetes Program Recommendations:    Agree with current orders.  Spoke with patient at bedside.  He states he is taking insulin as ordered but he does sometimes forget his lunch time Humalog.  Discussed A1C results.  He state is has been higher in the past.  We discussed goal A1C of 7% and I asked about home blood sugars.  He is interested in CGM for home to assist with monitoring.  When he checks his blood sugars, they are usually in the 180's.  Will ask resident if patient can have CGM at home.  This may be helpful.  Patient appreciative of visit.

## 2021-03-05 NOTE — Progress Notes (Signed)
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   Pt complains of some restlessness overnight, but overall feels well with the progress that he has made. He denies chest pain, SHOB, abdominal pain, confusion, and headaches. While ambulating in the room, he feels somewhat dizzy/lightheaded, but this is his only concern today. He continues to make progress with PT, and anticipating CIR approval.   He has no other complaints or concerns. He's updated on the plan for today.   Objective:  Vital signs in last 24 hours: Vitals:   03/04/21 2038 03/05/21 0500 03/05/21 0802 03/05/21 0949  BP: (!) 153/79  (!) 185/95 (!) 146/72  Pulse: 90  93 89  Resp: 18   18  Temp: 99.4 F (37.4 C)     TempSrc:      SpO2: 96%  100% 98%  Weight:  110.6 kg    Height:       Constitutional: Appears well-developed and well-nourished. No distress.  HENT: Normocephalic and atraumatic, EOMI, conjunctiva normal, moist mucous membranes Cardiovascular: Normal rate, regular rhythm, S1 and S2 present, no murmurs, rubs, gallops.  Distal pulses intact Respiratory: No respiratory distress, no accessory muscle use.  Effort is normal.  Lungs are clear to auscultation bilaterally. GI: Nondistended, soft, nontender to palpation, normal active bowel sounds Musculoskeletal: No peripheral edema, moving all extremities Skin: Warm and dry.  No rash, erythema, lesions noted. Neurologic exam: Mental status: A&Ox3 Cranial Nerves:             II: PERRL             III, IV, VI: Extra-occular motions intact bilaterally             V, VII: Face symmetric, sensation intact in all 3 divisions                IX, X: palate rises symmetrically             XI: Shoulder shrug normal bilaterally               XII: tongue midline     Motor: Strength 5/5 on all upper and lower extremities, bulk muscle and tone are normal Sensory: Light touch intact and symmetric bilaterally  Coordination: There is no dysmetria on finger-to-nose. Heel to shin test normal. 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 Symptoms are much improved today. Risk factors include uncontrolled diabetes and hyperlipidemia.  Echocardiogram shows EF of 50%, which is improved from prior echo in July 2022 showing EF of 35-40%.  Due to preserved EF, will defer ischemic eval and continue with medical therapy as below.  PT/OT recommending CIR.  -Appreciate neurology recommendations -Aspirin 81mg  and Plavix for 21 days then aspirin alone -Continue losartan 100 mg daily, will gradually work on normalizing blood pressure - PT/OT   Dyspnea on exertion Chronic HFrEF Remains euvolemic on exam.  Echo from 10/16 shows EF of 50% with indeterminate ventricular diastolic parameters. Previously saw Dr. Einar Gip but stopped when  he lost his insurance. Does recall having heart catheterization many years ago but cannot recall the results.  -Continue losartan, Jardiance  Type 2 diabetes A1c of 9.9.  Home regimen is Lantus 20 units twice daily with humalog 18 units 3 times daily.  CBG of 170 this morning. Unable to administer Victoza while in the hospital. -Levemir 15 units twice daily -NovoLog 5 units 3 times daily -Sliding scale insulin -Continue Jardiance 25 mg daily -Metformin 500 mg twice daily   Hyperlipidemia Last LDL 220 in December. Was supposed to be on Crestor and Zetia stopped due to side effects of one of the medications.   Cholesterol 340, LDL 271, triglycerides 182. Tolerating Crestor. -Crestor 40 mg daily -Outpatient PCSK9 inhibitors if cholesterol remains high  Hypertension Somewhat improved BPs 140s-180s this morning. -Continue losartan -Restart chlorthalidone 25 mg p.o. daily -Slowly correct BP over the next few days   Thyroid nodule TSH within normal limits. Complex cystic mass in the midline superficial to the thyroid isthmus appears increased since imaging in 2021. -We will need outpatient FNA and ENT referral  Prior to Admission Living Arrangement: Home Anticipated Discharge Location: Potential candidate for CIR Barriers to Discharge: Potential candidate for CIR Dispo: Anticipated discharge pending potential CIR placement  Orvis Brill, MD 03/05/2021, 11:43 AM Pager: [phone]  After 5pm on weekdays and 1pm on weekends: On Call pager [phone]

## 2021-03-06 DIAGNOSIS — I63013 Cerebral infarction due to thrombosis of bilateral vertebral arteries: Secondary | ICD-10-CM | POA: Diagnosis not present

## 2021-03-06 LAB — BASIC METABOLIC PANEL
Anion gap: 7 (ref 5–15)
BUN: 20 mg/dL (ref 8–23)
CO2: 26 mmol/L (ref 22–32)
Calcium: 9 mg/dL (ref 8.9–10.3)
Chloride: 101 mmol/L (ref 98–111)
Creatinine, Ser: 1.08 mg/dL (ref 0.61–1.24)
GFR, Estimated: >60 mL/min (ref >60)
Glucose, Bld: 189 mg/dL — ABNORMAL HIGH (ref 70–99)
Potassium: 3.2 mmol/L — ABNORMAL LOW (ref 3.5–5.1)
Sodium: 134 mmol/L — ABNORMAL LOW (ref 135–145)

## 2021-03-06 LAB — GLUCOSE, CAPILLARY
Glucose-Capillary: 178 mg/dL — ABNORMAL HIGH (ref 70–99)
Glucose-Capillary: 272 mg/dL — ABNORMAL HIGH (ref 70–99)
Glucose-Capillary: 189 mg/dL — ABNORMAL HIGH (ref 70–99)
Glucose-Capillary: 312 mg/dL — ABNORMAL HIGH (ref 70–99)
Glucose-Capillary: 240 mg/dL — ABNORMAL HIGH (ref 70–99)

## 2021-03-06 MED ORDER — CARVEDILOL 25 MG PO TABS: 25.0000 mg | ORAL_TABLET | Freq: Two times a day (BID) | ORAL | Status: DC

## 2021-03-06 MED ORDER — POTASSIUM CHLORIDE CRYS ER 20 MEQ PO TBCR
40.0000 meq | EXTENDED_RELEASE_TABLET | Freq: Two times a day (BID) | ORAL | Status: AC
  Administered 2021-03-06 (×2): 40 meq via ORAL
  Filled 2021-03-06 (×2): qty 2

## 2021-03-06 NOTE — Progress Notes (Signed)
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   Patient was seen at bedside rounds this morning.  He does not endorse any specific complaints, however he states that he feels somewhat more dizzy and lightheaded compared to yesterday.  Feels that he was walking better yesterday, but remains optimistic that he will gain his strength over time.  There are no other complaints or concerns today.   Objective:  Vital signs in last 24 hours: Vitals:   03/05/21 0949 03/05/21 1344 03/05/21 2019 03/06/21 0536  BP: (!) 146/72 139/63 (!) 148/78 (!) 166/97  Pulse: 89 87  81  Resp: 18 16 18 18   Temp:  98.7 F (37.1 C) 98.6 F (37 C) 98 F (36.7 C)  TempSrc:  Oral Oral Oral  SpO2: 98% 96% 99% 98%  Weight:      Height:       Constitutional: Appears well-developed and well-nourished. No distress.  HENT: Normocephalic and atraumatic, EOMI, conjunctiva normal, moist mucous membranes Cardiovascular: Normal rate, regular rhythm, S1 and S2 present, no murmurs, rubs, gallops.  Distal pulses intact Respiratory: No respiratory distress, no accessory muscle use.  Effort is normal.  Lungs are clear to auscultation bilaterally. GI: Nondistended, soft, nontender to palpation, normal active bowel sounds Musculoskeletal: No peripheral edema, moving all extremities Skin: Warm and dry.  No rash, erythema, lesions noted. Neurologic exam: Mental status: A&Ox3 Cranial Nerves:             II: PERRL             V, VII: Face symmetric, sensation intact in all 3 divisions                XI: Shoulder shrug normal bilaterally   Sensory: Light touch intact and symmetric bilaterally  Coordination: There is some dysmetria on finger-to-nose on the left side, no dysmetria on right side.  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 Symptoms are much improved today. Risk factors include uncontrolled diabetes and hyperlipidemia. Echocardiogram shows EF of 50%, which is improved from prior echo in July 2022 showing EF of 35-40%. Due to preserved EF, will defer ischemic eval and continue with medical therapy as below. PT/OT recommending CIR. Awaiting insurance authorization for CIR. -Appreciate neurology recommendations -Aspirin 81mg  and Plavix for 21 days then aspirin alone -Continue losartan 100 mg daily, will gradually work on normalizing blood pressure - PT/OT   Dyspnea on exertion Chronic HFrEF Remains euvolemic on exam. Echo from 10/16 shows EF of 50% with indeterminate ventricular diastolic parameters. Previously saw Dr. Einar Gip but stopped when he lost his insurance. Does recall having heart catheterization many years ago but cannot recall the results.  -Continue losartan, Jardiance  Type 2 diabetes A1c of 9.9. Home regimen is Lantus 20 units twice daily with humalog 18 units 3 times daily. CBG of 189 this morning. Unable to administer Victoza while in the hospital. -Levemir 15 units twice daily -NovoLog 5 units 3 times daily -Sliding scale insulin -Continue  Jardiance 25 mg daily -Metformin 500 mg twice daily   Hyperlipidemia Last LDL 220 in December. Was supposed to be on Crestor and Zetia stopped due to side effects of one of the medications. Cholesterol 340, LDL 271, triglycerides 182. Tolerating Crestor. -Crestor 40 mg daily -Outpatient PCSK9 inhibitors if cholesterol remains high  Hypertension Somewhat improved BPs 150s-160s this morning. -Continue  losartan -Continue chlorthalidone 25 mg p.o. daily -Slowly correct BP over the next few days   Thyroid nodule TSH within normal limits. Complex cystic mass in the midline superficial to the thyroid isthmus appears increased since imaging in 2021. -We will need outpatient FNA and ENT referral  Prior to Admission Living Arrangement: Home Anticipated Discharge Location: Insurance authorization for CIR Barriers to Discharge: Insurance authorization for CIR Dispo: Anticipated discharge pending potential CIR placement  Orvis Brill, MD 03/06/2021, 7:07 AM Pager: [phone]  After 5pm on weekdays and 1pm on weekends: On Call pager [phone]

## 2021-03-06 NOTE — Progress Notes (Signed)
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  Engaged in ADL session, functional mobility with min assist using RW cueing for safety, step length and balance.  Min assist for toielting and min guard for transfers, increased shakiness/tremors initially but faded with continued mobility.  Issued and educated on exercises for yellow theraputty with L hand for [facility] and strength.  Continue to recommend CIR, as believe pt can progress to modified independent level with intensive therapy. Will follow.    Recommendations for follow up therapy are one component of a multi-disciplinary discharge planning process, led by the attending physician.  Recommendations may be updated based on patient status, additional functional criteria and insurance authorization.    Follow Up Recommendations  CIR    Equipment Recommendations  Other (comment) (TBD)    Recommendations for Other Services Rehab consult    Precautions / Restrictions Precautions Precautions: Fall Precaution Comments: tremors/shakiness Restrictions Weight Bearing Restrictions: No       Mobility Bed Mobility               General bed mobility comments: EOB upon entry    Transfers Overall transfer level: Needs assistance Equipment used: Rolling walker (2 wheeled) Transfers: Sit to/from Stand Sit to Stand: Min guard         General transfer comment: min guard for safety/balance    Balance Overall balance assessment: Needs assistance Sitting-balance support: Feet supported;No upper extremity supported Sitting  balance-Leahy Scale: Good     Standing balance support: Bilateral upper extremity supported;During functional activity Standing balance-Leahy Scale: Poor Standing balance comment: relies on BUE and external support                           ADL either performed or assessed with clinical judgement   ADL Overall ADL's : Needs assistance/impaired     Grooming: Minimal assistance;Standing;Wash/dry hands Grooming Details (indicate cue type and reason): min assist without UE support, leaning on sink with min guard and relies on external support                 Toilet Transfer: Ambulation;RW;Min guard   Toileting- Clothing Manipulation and Hygiene: Minimal assistance;Sit to/from stand       Functional mobility during ADLs: Minimal assistance;Rolling walker General ADL Comments: cueing during ADls for RW safety, tremors noted and shuffled gait requiring cueing for increased step length     Vision       Perception     Praxis      Cognition Arousal/Alertness: Awake/alert Behavior During Therapy: WFL for tasks assessed/performed Overall Cognitive Status: Within Functional Limits for tasks assessed                                          Exercises Exercises: Other exercises Other Exercises Other Exercises: issued theraputty yellow- engaged in L hand gross grasp, flattening, pinch and rolling   Shoulder Instructions       General Comments VSSL 143/88 EOB and 149/85 standing  Pertinent Vitals/ Pain       Pain Assessment: No/denies pain  Home Living                                          Prior Functioning/Environment              Frequency  Min 2X/week        Progress Toward Goals  OT Goals(current goals can now be found in the care plan section)  Progress towards OT goals: Progressing toward goals  Acute Rehab OT Goals Patient Stated Goal: get back to independent OT Goal Formulation: With patient   Plan Discharge plan remains appropriate;Frequency remains appropriate    Co-evaluation                 AM-PAC OT "6 Clicks" Daily Activity     Outcome Measure   Help from another person eating meals?: A Little Help from another person taking care of personal grooming?: A Little Help from another person toileting, which includes using toliet, bedpan, or urinal?: A Little Help from another person bathing (including washing, rinsing, drying)?: A Little Help from another person to put on and taking off regular upper body clothing?: A Little Help from another person to put on and taking off regular lower body clothing?: A Little 6 Click Score: 18    End of Session Equipment Utilized During Treatment: Rolling walker  OT Visit Diagnosis: Other abnormalities of gait and mobility (R26.89);Muscle weakness (generalized) (M62.81);Other symptoms and signs involving the nervous system (R29.898)   Activity Tolerance Patient tolerated treatment well   Patient Left in chair;with call bell/phone within reach;Other (comment) (seated EIB)   Nurse Communication Mobility status        Time: 9758-8325 OT Time Calculation (min): 21 min  Charges: OT General Charges $OT Visit: 1 Visit OT Treatments $Self Care/Home Management : 8-22 mins  Jolaine Artist, OT Acute Rehabilitation Services Pager [phone] Office [phone]   Delight Stare 03/06/2021, 1:02 PM

## 2021-03-06 NOTE — PMR Pre-admission (Signed)
PMR Admission Coordinator Pre-Admission Assessment  Patient: Thomas Gardner is an 65 y.o., male MRN: [id number] DOB: 11-18-1955 Height: _0  (167.6 cm) Weight: 110.6 kg  Insurance Information HMO: yes     PPO:      PCP:      IPA:      80/20:      OTHER:  PRIMARY: UHC  Medicare      Policy#: [id number]      Subscriber: Pt.  CM Name: n/a       Phone#: [phone]     Fax#: [phone] Pre-Cert#:[id number]  Employer:  I received a call from [employer] [facility] granting approval on 03/06/21 for admission 03/07/21 for 7 days with updates due 03/13/21 Benefits:  Phone #: [phone]     Name:  Irene Shipper Date: 01/18/2021 - present Deductible: $0 (does not have deductible) OOP Max: $3,600 ($115 met) CIR: $295/day co-pay for days 1-5, $0/day co-pay for days 6+ SNF: $0.00 Copayment per day for days 1-20; $188.00 Copayment per day for days 21-40; $0.00 Copayment per day for days 41-100 for Medicare-covered care/maximum 100 days/benefit period Outpatient: $30/visit co-pay Home Health:  100% coverage, 0% co-insurance; limited by medical necessity DME: 80% coverage; 20% co-insurance Providers: in network   SECONDARY:       Policy#:      Phone#:   The "Data Collection Information Summary" for patients in Inpatient Rehabilitation Facilities with attached "Privacy Act [place] Records" was provided and verbally reviewed with: Patient  Emergency Contact Information Contact Information     Name Relation Home Work Mobile   [place] Sister [phone]  [phone]       Current Medical History  Patient Admitting Diagnosis: CVA History of Present Illness: Pt. 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 Pt. With significant dizziness throughout admission, felt that Small acute  thalamic stroke versus PCA stenosis contributing to his dizziness. CIR was consulted to assist return to PLOF.    Complete NIHSS TOTAL: 0  Patient's medical record from [facility] has been reviewed by the rehabilitation admission coordinator and physician.  Past Medical History  Past Medical History:  Diagnosis Date   Arthritis    Asthma    CHF (congestive heart failure) ([place])    Diabetes mellitus 2003   GERD (gastroesophageal reflux disease)    Hyperlipidemia    Hypertension    Morbid obesity ([place])    Sleep apnea     Has the patient had major surgery during 100 days prior to admission? No  Family History   family history includes Cancer in his maternal aunt, maternal uncle, and mother; Diabetes in his maternal aunt and mother; Stroke in his father.  Current Medications  Current Facility-Administered Medications:    amoxicillin-clavulanate (AUGMENTIN) 875-125 MG per tablet 1 tablet, 1 tablet, Oral, Q12H, Iona Beard, MD, 1 tablet at 03/06/21 0815   aspirin chewable tablet 81 mg, 81 mg, Oral, Daily, 81 mg at 03/06/21 0826 **OR** aspirin suppository 300 mg, 300 mg, Rectal, Daily, Gaylan Gerold, DO   chlorthalidone (HYGROTON) tablet 25 mg, 25 mg, Oral, Daily, Orvis Brill, MD, 25 mg at 03/06/21 0831   clopidogrel (PLAVIX) tablet 75 mg, 75 mg, Oral, Daily, Gaylan Gerold, DO, 75 mg at 03/06/21 0817   empagliflozin (JARDIANCE) tablet 25 mg, 25 mg, Oral, Daily,  Iona Beard, MD, 25 mg at 03/06/21 0817   enoxaparin (LOVENOX) injection 55 mg, 55 mg, Subcutaneous, Q24H, Gaylan Gerold, DO, 55 mg at 03/06/21 0826   insulin aspart (novoLOG) injection 0-15 Units, 0-15 Units, Subcutaneous, TID WC, Gaylan Gerold, DO, 3 Units at 03/06/21 1342   insulin aspart (novoLOG) injection 0-5 Units, 0-5 Units, Subcutaneous, QHS, Gaylan Gerold, DO   insulin aspart (novoLOG) injection 5 Units, 5 Units, Subcutaneous, TID WC, Gaylan Gerold, DO, 5 Units at 03/06/21 1342   insulin  detemir (LEVEMIR) injection 15 Units, 15 Units, Subcutaneous, BID, Gaylan Gerold, DO, 15 Units at 03/06/21 0825   losartan (COZAAR) tablet 100 mg, 100 mg, Oral, Daily, Iona Beard, MD, 100 mg at 03/06/21 0818   metFORMIN (GLUCOPHAGE-XR) 24 hr tablet 500 mg, 500 mg, Oral, BID WC, Iona Beard, MD   potassium chloride SA (KLOR-CON) CR tablet 40 mEq, 40 mEq, Oral, BID, Orvis Brill, MD, 40 mEq at 03/06/21 0815   rosuvastatin (CRESTOR) tablet 40 mg, 40 mg, Oral, Daily, Gaylan Gerold, DO, 40 mg at 03/06/21 4562  Patients Current Diet:  Diet Order             Diet Carb Modified Fluid consistency: Thin; Room service appropriate? Yes  Diet effective now                   Precautions / Restrictions Precautions Precautions: Fall Precaution Comments: tremors/shakiness Restrictions Weight Bearing Restrictions: No   Has the patient had 2 or more falls or a fall with injury in the past year? No  Prior Activity Level Community (5-7x/wk): Active in the community PTA  Prior Functional Level Self Care: Did the patient need help bathing, dressing, using the toilet or eating? Independent  Indoor Mobility: Did the patient need assistance with walking from room to room (with or without device)? Independent  Stairs: Did the patient need assistance with internal or external stairs (with or without device)? Independent  Functional Cognition: Did the patient need help planning regular tasks such as shopping or remembering to take medications? Independent  Patient Information Are you of Hispanic, Latino/a,or Spanish origin?: A. No, not of Hispanic, Latino/a, or Spanish origin What is your race?: B. Black or African American Do you need or want an interpreter to communicate with a doctor or health care staff?: 0. No  Patient's Response To:  Health Literacy and Transportation Is the patient able to respond to health literacy and transportation needs?: Yes Health Literacy - How often do  you need to have someone help you when you read instructions, pamphlets, or other written material from your doctor or pharmacy?: Never In the past 12 months, has lack of transportation kept you from medical appointments or from getting medications?: No In the past 12 months, has lack of transportation kept you from meetings, work, or from getting things needed for daily living?: No  Development worker, international aid / [place] Devices/Equipment: None Home Equipment: Grab bars - tub/shower, Grab bars - toilet  Prior Device Use: Indicate devices/aids used by the patient prior to current illness, exacerbation or injury? None of the above  Current Functional Level Cognition  Arousal/Alertness: Awake/alert Overall Cognitive Status: Within Functional Limits for tasks assessed Orientation Level: Oriented X4 General Comments: motivated to get better, anxious regarding home situation and d/c plan Attention: Selective Selective Attention: Appears intact Memory: Appears intact Awareness: Appears intact Problem Solving: Appears intact    Extremity Assessment (includes Sensation/Coordination)  Upper Extremity Assessment: LUE deficits/detail LUE Deficits /  Arousal/Alertness: Awake/alert Overall Cognitive Status: Within Functional Limits for tasks assessed Orientation Level: Oriented X4 General Comments: motivated to get better, anxious regarding home situation and d/c plan Attention: Selective Selective Attention: Appears intact Memory: Appears intact Awareness: Appears intact Problem Solving: Appears intact    Extremity Assessment (includes Sensation/Coordination)   Upper Extremity Assessment: LUE deficits/detail LUE Deficits / Details: grossly 4-/5 (mildly weaker) with decreased coordination LUE Sensation: WNL LUE Coordination: decreased gross motor, decreased fine motor  Lower Extremity Assessment: Defer to PT evaluation LLE Deficits / Details: pt with noted buckling during standing and walking, grossly 4/5 LLE Sensation: decreased light touch LLE Coordination: decreased gross motor     ADLs   Overall ADL's : Needs assistance/impaired Grooming: Minimal assistance, Standing, Wash/dry hands Grooming Details (indicate cue type and reason): min assist without UE support, leaning on sink with min guard and relies on external support Upper Body Dressing : Minimal assistance, Sitting Lower Body  Dressing: Minimal assistance, Sit to/from stand Lower Body Dressing Details (indicate cue type and reason): able to don/doff socks, min assist in standing Toilet Transfer: Ambulation, RW, Min guard Toileting- Clothing Manipulation and Hygiene: Minimal assistance, Sit to/from stand Functional mobility during ADLs: Minimal assistance, Rolling walker General ADL Comments: cueing during ADls for RW safety, tremors noted and shuffled gait requiring cueing for increased step length     Mobility   Overal bed mobility: Needs Assistance Bed Mobility: Supine to Sit Supine to sit: Supervision General bed mobility comments: pt received EOB upon PT entry     Transfers   Overall transfer level: Needs assistance Equipment used: Rolling walker (2 wheeled) Transfers: Sit to/from Stand Sit to Stand: Min assist General transfer comment: minA to steady due to onset of tremors/shakiness and knee instability/buckling     Ambulation / Gait / Stairs / Wheelchair Mobility   Ambulation/Gait Ambulation/Gait assistance: Min assist Gait Distance (Feet): 60 Feet Assistive device: Rolling walker (2 wheeled) Gait Pattern/deviations: Step-to pattern, Step-through pattern, Decreased stride length, Narrow base of support General Gait Details: pt with varied gait. Pt initially with very narrow, near scissoring, base of support and short shuffled steps. With verbal cues to increase distance between feet and step length pt improved but couldn't maintain more than 5 prior to reverting back to shuffled narrow gait pattern, pt with 2 episodes of L knee buckling requiring modA to preven fall. Pt remains dependent on UEs as well Gait velocity: very slow and guarded Gait velocity interpretation: <1.8 ft/sec, indicate of risk for recurrent falls     Posture / Balance Balance Overall balance assessment: Needs assistance Sitting-balance support: Feet supported, No upper extremity supported Sitting balance-Leahy Scale:  Good Standing balance support: Bilateral upper extremity supported, During functional activity, No upper extremity supported Standing balance-Leahy Scale: Poor Standing balance comment: pt relies on BUE support dynamically, L lateral lean with fatigue     Special needs/care consideration Skin intact and Diabetic management Novolog 0-5 units sQ3 sx a day with meals,0-5 units daily at bedtime. Levemir 15 units sQ 2x daily     Previous Home Environment (from acute therapy documentation) Living Arrangements: Alone  Lives With: Alone Available Help at Discharge: Friend(s), Available PRN/intermittently Type of Home: House Home Layout: One level Home Access: Stairs to enter Entrance Stairs-Rails: None Entrance Stairs-Number of Steps: 2 Bathroom Shower/Tub: Tub/shower unit Bathroom Toilet: Handicapped height Home Care Services: No Additional Comments: pt reports he is moving into his parents (they're deceased) house and can't go back to his apartment because there   Discharge Bathroom Accessibility: Yes How Accessible: Accessible via walker Does the patient have any problems obtaining your medications?: No  Social/Family/Support Systems Patient Roles: Other (Comment) Contact Information: [phone] Anticipated Caregiver: Cordice (friend) Anticipated Caregiver's Contact Information: [phone] Ability/Limitations of Caregiver: Min A Caregiver Availability: Intermittent Discharge Plan Discussed with Primary Caregiver: No Is Caregiver In Agreement with Plan?: Yes  Goals Patient/Family Goal  for Rehab: PT/OT Mod I Expected length of stay: 5-7 days Pt/Family Agrees to Admission and willing to participate: Yes Program Orientation Provided & Reviewed with Pt/Caregiver Including Roles  & Responsibilities: Yes  Decrease burden of Care through IP rehab admission: Specialzed equipment needs, Decrease number of caregivers, Bowel and bladder program, and Patient/family education  Possible need for SNF placement upon discharge: not anticipated.   Patient Condition: I have reviewed medical records from [facility] , spoken with CM, and patient. I met with patient at the bedside for inpatient rehabilitation assessment.  Patient will benefit from ongoing PT and OT, can actively participate in 3 hours of therapy a day 5 days of the week, and can make measurable gains during the admission.  Patient will also benefit from the coordinated team approach during an Inpatient Acute Rehabilitation admission.  The patient will receive intensive therapy as well as Rehabilitation physician, nursing, social worker, and care management interventions.  Due to safety, disease management, medication administration, and patient education the patient requires 24 hour a day rehabilitation nursing.  The patient is currently min A-min G with mobility and basic ADLs.  Discharge setting and therapy post discharge at home with home health is anticipated.  Patient has agreed to participate in the Acute Inpatient Rehabilitation Program and will admit today.  Preadmission Screen Completed By:  Genella Mech, 03/06/2021 3:42 PM ______________________________________________________________________   Discussed status with Dr. Posey Pronto  on 03/07/21 at 43 and received approval for admission today.  Admission Coordinator:  Genella Mech, CCC-SLP, time 1000/Date 03/07/21   Assessment/Plan: Diagnosis: CVA Does the need for close, 24 hr/day Medical supervision in concert with the patient's rehab needs make it  unreasonable for this patient to be served in a less intensive setting? Yes Co-Morbidities requiring supervision/potential complications: HFrEF (EF 35%), uncontrolled type 2 diabetes, hypertension, hyperlipidemia Due to bowel management, safety, disease management, and patient education, does the patient require 24 hr/day rehab nursing? Yes Does the patient require coordinated care of a physician, rehab nurse, PT, OT to address physical and functional deficits in the context of the above medical diagnosis(es)? Yes Addressing deficits in the following areas: balance, endurance, locomotion, strength, transferring, bathing, dressing, toileting, and psychosocial support Can the patient actively participate in an intensive therapy program of at least 3 hrs of therapy 5 days a week? Yes The potential for patient to make measurable gains while on inpatient rehab is excellent and good Anticipated functional outcomes upon discharge from inpatient rehab: modified independent and supervision PT, modified independent and supervision OT, n/a SLP Estimated rehab length of stay to reach the above functional goals is: 7-10 days. Anticipated discharge destination: Home 10. Overall Rehab/Functional Prognosis: good   MD Signature: Delice Lesch, MD, ABPMR

## 2021-03-06 NOTE — Plan of Care (Signed)
  Problem: Education: Goal: Knowledge of disease or condition will improve Outcome: Progressing Goal: Knowledge of secondary prevention will improve Outcome: Progressing   Problem: Education: Goal: Knowledge of General Education information will improve Description: Including pain rating scale, medication(s)/side effects and non-pharmacologic comfort measures Outcome: Progressing   Problem: Health Behavior/Discharge Planning: Goal: Ability to manage health-related needs will improve Outcome: Progressing   Problem: Clinical Measurements: Goal: Ability to maintain clinical measurements within normal limits will improve Outcome: Progressing Goal: Will remain free from infection Outcome: Progressing Goal: Diagnostic test results will improve Outcome: Progressing Goal: Respiratory complications will improve Outcome: Progressing Goal: Cardiovascular complication will be avoided Outcome: Progressing   Problem: Activity: Goal: Risk for activity intolerance will decrease Outcome: Progressing   Problem: Nutrition: Goal: Adequate nutrition will be maintained Outcome: Progressing   Problem: Coping: Goal: Level of anxiety will decrease Outcome: Progressing   Problem: Elimination: Goal: Will not experience complications related to bowel motility Outcome: Progressing Goal: Will not experience complications related to urinary retention Outcome: Progressing   Problem: Pain Managment: Goal: General experience of comfort will improve Outcome: Progressing

## 2021-03-06 NOTE — Care Management Important Message (Signed)
Important Message  Patient Details  Name: Thomas Gardner MRN: [id number] Date of Birth: 03/05/1956   Medicare Important Message Given:  Yes     Orbie Pyo 03/06/2021, 2:10 PM

## 2021-03-06 NOTE — Plan of Care (Signed)

## 2021-03-06 NOTE — Progress Notes (Signed)
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 Pt continues to have tremors and shakiness in addition to L knee buckling requiring modA to prevent pt from falling. Pt continues to be an excellent candidate for CIR upon d/c as he demonstrates good rehab potential. Acute PT to cont to follow.    Recommendations for follow up therapy are one component of a multi-disciplinary discharge planning process, led by the attending physician.  Recommendations may be updated based on patient status, additional functional criteria and insurance authorization.  Follow Up Recommendations  CIR     Equipment Recommendations  Rolling walker with 5" wheels    Recommendations for Other Services Rehab consult     Precautions / Restrictions Precautions Precautions: Fall Precaution Comments: tremors/shakiness Restrictions Weight Bearing Restrictions: No     Mobility  Bed Mobility               General bed mobility comments: pt received EOB upon PT entry    Transfers Overall transfer level: Needs assistance Equipment used: Rolling walker (2 wheeled) Transfers: Sit to/from Stand Sit to Stand: Min assist         General transfer comment: minA to steady due to onset of tremors/shakiness and knee instability/buckling  Ambulation/Gait Ambulation/Gait assistance: Min assist Gait Distance (Feet): 60 Feet Assistive device: Rolling walker (2 wheeled) Gait  Pattern/deviations: Step-to pattern;Step-through pattern;Decreased stride length;Narrow base of support Gait velocity: very slow and guarded Gait velocity interpretation: <1.8 ft/sec, indicate of risk for recurrent falls General Gait Details: pt with varied gait. Pt initially with very narrow, near scissoring, base of support and short shuffled steps. With verbal cues to increase distance between feet and step length pt improved but couldn't maintain more than 5 prior to reverting back to shuffled narrow gait pattern, pt with 2 episodes of L knee buckling requiring modA to preven fall. Pt remains dependent on UEs as well   Stairs             Wheelchair Mobility    Modified Rankin (Stroke Patients Only)       Balance Overall balance assessment: Needs assistance Sitting-balance support: Feet supported;No upper extremity supported Sitting balance-Leahy Scale: Good     Standing balance support: Bilateral upper extremity supported;During functional activity;No upper extremity supported Standing balance-Leahy Scale: Poor Standing balance comment: pt relies on BUE support dynamically, L lateral lean with fatigue                            Cognition Arousal/Alertness: Awake/alert Behavior During Therapy: WFL for tasks assessed/performed Overall Cognitive Status: Within Functional Limits for tasks assessed                                 General Comments: motivated to get better, anxious regarding home situation and d/c plan      Exercises General Exercises - Lower Extremity Heel Slides: AROM;Both;10 reps;Seated (  against moderate resistance on R and minimal maual resistance on L) Other Exercises Other Exercises: pt pulled self using feet in recliner 10' Other Exercises: pt pushed self with feet backwards x 40 feet Other Exercises: worked on control squat to edge of chair and pushing up with LEs vs UEs    General Comments General comments (skin  integrity, edema, etc.): VSS, reports he still has some lightheadedness but improved from yesterday      Pertinent Vitals/Pain Pain Assessment: No/denies pain    Home Living                      Prior Function            PT Goals (current goals can now be found in the care plan section) Acute Rehab PT Goals Patient Stated Goal: get better Progress towards PT goals: Progressing toward goals    Frequency    Min 4X/week      PT Plan Current plan remains appropriate    Co-evaluation              AM-PAC PT "6 Clicks" Mobility   Outcome Measure  Help needed turning from your back to your side while in a flat bed without using bedrails?: A Little Help needed moving from lying on your back to sitting on the side of a flat bed without using bedrails?: A Little Help needed moving to and from a bed to a chair (including a wheelchair)?: A Little Help needed standing up from a chair using your arms (e.g., wheelchair or bedside chair)?: A Little Help needed to walk in hospital room?: A Lot Help needed climbing 3-5 steps with a railing? : Total 6 Click Score: 15    End of Session Equipment Utilized During Treatment: Gait belt Activity Tolerance: Patient tolerated treatment well Patient left: in chair;with call bell/phone within reach Nurse Communication: Mobility status PT Visit Diagnosis: Unsteadiness on feet (R26.81);Muscle weakness (generalized) (M62.81);Difficulty in walking, not elsewhere classified (R26.2)     Time: 5597-4163 PT Time Calculation (min) (ACUTE ONLY): 26 min  Charges:  $Gait Training: 8-22 mins $Therapeutic Exercise: 8-22 mins                     Kittie Plater, PT, DPT Acute Rehabilitation Services Pager #: [phone] Office #: [phone]    Berline Lopes 03/06/2021, 1:11 PM

## 2021-03-06 NOTE — Progress Notes (Signed)
Inpatient Rehab Admissions Coordinator:   I do not have a CIR bed for this pt. Today. I opened a case with his insurance and continue to await authorization. I will follow for potential admit pending insurance auth and bed availability.   Clemens Catholic, [place], [place] Admissions Coordinator  [phone] ([place]) [phone] (office)

## 2021-03-07 ENCOUNTER — Inpatient Hospital Stay (HOSPITAL_COMMUNITY)
Admission: RE | Admit: 2021-03-07 | Discharge: 2021-03-16 | DRG: 092 | Disposition: A | Discharge status: Home / Self Care | Payer: Medicare Other | Source: Intra-hospital | Attending: Physical Medicine & Rehabilitation | Admitting: Physical Medicine & Rehabilitation

## 2021-03-07 ENCOUNTER — Other Ambulatory Visit: Payer: Self-pay

## 2021-03-07 ENCOUNTER — Encounter (HOSPITAL_COMMUNITY): Payer: Self-pay | Admitting: Physical Medicine & Rehabilitation

## 2021-03-07 DIAGNOSIS — R221 Localized swelling, mass and lump, neck: Secondary | ICD-10-CM | POA: Diagnosis present

## 2021-03-07 DIAGNOSIS — M1990 Unspecified osteoarthritis, unspecified site: Secondary | ICD-10-CM | POA: Diagnosis present

## 2021-03-07 DIAGNOSIS — Z597 Insufficient social insurance and welfare support: Secondary | ICD-10-CM | POA: Diagnosis not present

## 2021-03-07 DIAGNOSIS — I639 Cerebral infarction, unspecified: Secondary | ICD-10-CM | POA: Diagnosis not present

## 2021-03-07 DIAGNOSIS — Z79899 Other long term (current) drug therapy: Secondary | ICD-10-CM

## 2021-03-07 DIAGNOSIS — M25362 Other instability, left knee: Secondary | ICD-10-CM | POA: Diagnosis present

## 2021-03-07 DIAGNOSIS — I5020 Unspecified systolic (congestive) heart failure: Secondary | ICD-10-CM | POA: Diagnosis not present

## 2021-03-07 DIAGNOSIS — K219 Gastro-esophageal reflux disease without esophagitis: Secondary | ICD-10-CM | POA: Diagnosis present

## 2021-03-07 DIAGNOSIS — J4520 Mild intermittent asthma, uncomplicated: Secondary | ICD-10-CM

## 2021-03-07 DIAGNOSIS — Z9114 Patient's other noncompliance with medication regimen: Secondary | ICD-10-CM

## 2021-03-07 DIAGNOSIS — E119 Type 2 diabetes mellitus without complications: Secondary | ICD-10-CM | POA: Diagnosis present

## 2021-03-07 DIAGNOSIS — R42 Dizziness and giddiness: Secondary | ICD-10-CM | POA: Diagnosis not present

## 2021-03-07 DIAGNOSIS — Z6841 Body Mass Index (BMI) 40.0 and over, adult: Secondary | ICD-10-CM

## 2021-03-07 DIAGNOSIS — I10 Essential (primary) hypertension: Secondary | ICD-10-CM

## 2021-03-07 DIAGNOSIS — I63012 Cerebral infarction due to thrombosis of left vertebral artery: Secondary | ICD-10-CM | POA: Diagnosis not present

## 2021-03-07 DIAGNOSIS — E785 Hyperlipidemia, unspecified: Secondary | ICD-10-CM

## 2021-03-07 DIAGNOSIS — G4733 Obstructive sleep apnea (adult) (pediatric): Secondary | ICD-10-CM | POA: Diagnosis present

## 2021-03-07 DIAGNOSIS — E11628 Type 2 diabetes mellitus with other skin complications: Secondary | ICD-10-CM | POA: Diagnosis not present

## 2021-03-07 DIAGNOSIS — E876 Hypokalemia: Secondary | ICD-10-CM | POA: Diagnosis present

## 2021-03-07 DIAGNOSIS — R2689 Other abnormalities of gait and mobility: Secondary | ICD-10-CM | POA: Diagnosis present

## 2021-03-07 DIAGNOSIS — Z881 Allergy status to other antibiotic agents status: Secondary | ICD-10-CM

## 2021-03-07 DIAGNOSIS — Z8673 Personal history of transient ischemic attack (TIA), and cerebral infarction without residual deficits: Secondary | ICD-10-CM | POA: Diagnosis not present

## 2021-03-07 DIAGNOSIS — I5022 Chronic systolic (congestive) heart failure: Secondary | ICD-10-CM | POA: Diagnosis present

## 2021-03-07 DIAGNOSIS — I110 Hypertensive heart disease with heart failure: Secondary | ICD-10-CM | POA: Diagnosis present

## 2021-03-07 DIAGNOSIS — Z823 Family history of stroke: Secondary | ICD-10-CM | POA: Diagnosis not present

## 2021-03-07 DIAGNOSIS — Z5329 Procedure and treatment not carried out because of patient's decision for other reasons: Secondary | ICD-10-CM | POA: Diagnosis not present

## 2021-03-07 DIAGNOSIS — Z7985 Long-term (current) use of injectable non-insulin antidiabetic drugs: Secondary | ICD-10-CM

## 2021-03-07 DIAGNOSIS — Z7984 Long term (current) use of oral hypoglycemic drugs: Secondary | ICD-10-CM | POA: Diagnosis not present

## 2021-03-07 DIAGNOSIS — R296 Repeated falls: Secondary | ICD-10-CM | POA: Diagnosis present

## 2021-03-07 DIAGNOSIS — Z794 Long term (current) use of insulin: Secondary | ICD-10-CM | POA: Diagnosis not present

## 2021-03-07 HISTORY — DX: Cerebral infarction, unspecified: I63.9

## 2021-03-07 LAB — GLUCOSE, CAPILLARY
Glucose-Capillary: 181 mg/dL — ABNORMAL HIGH (ref 70–99)
Glucose-Capillary: 246 mg/dL — ABNORMAL HIGH (ref 70–99)
Glucose-Capillary: 241 mg/dL — ABNORMAL HIGH (ref 70–99)
Glucose-Capillary: 205 mg/dL — ABNORMAL HIGH (ref 70–99)

## 2021-03-07 LAB — BASIC METABOLIC PANEL
Anion gap: 12 (ref 5–15)
BUN: 23 mg/dL (ref 8–23)
CO2: 22 mmol/L (ref 22–32)
Calcium: 9.2 mg/dL (ref 8.9–10.3)
Chloride: 102 mmol/L (ref 98–111)
Creatinine, Ser: 1.18 mg/dL (ref 0.61–1.24)
GFR, Estimated: >60 mL/min (ref >60)
Glucose, Bld: 151 mg/dL — ABNORMAL HIGH (ref 70–99)
Potassium: 3.8 mmol/L (ref 3.5–5.1)
Sodium: 136 mmol/L (ref 135–145)

## 2021-03-07 MED ORDER — LOSARTAN POTASSIUM 50 MG PO TABS: 100.0000 mg | ORAL_TABLET | Freq: Every day | ORAL | Status: DC

## 2021-03-07 MED ORDER — CLOPIDOGREL BISULFATE 75 MG PO TABS: 75.0000 mg | ORAL_TABLET | Freq: Every day | ORAL | Status: DC

## 2021-03-07 MED ORDER — ENOXAPARIN SODIUM 60 MG/0.6ML IJ SOSY: 55.0000 mg | PREFILLED_SYRINGE | INTRAMUSCULAR | Status: DC

## 2021-03-07 MED ORDER — INSULIN GLARGINE-YFGN 100 UNIT/ML ~~LOC~~ SOLN
15.0000 [IU] | Freq: Two times a day (BID) | SUBCUTANEOUS | Status: DC
  Administered 2021-03-07 – 2021-03-10 (×7): 15 [IU] via SUBCUTANEOUS
  Filled 2021-03-07 (×8): qty 0.15

## 2021-03-07 MED ORDER — ROSUVASTATIN CALCIUM 40 MG PO TABS: 40.0000 mg | ORAL_TABLET | Freq: Every day | ORAL | Status: DC

## 2021-03-07 MED ORDER — PROCHLORPERAZINE MALEATE 5 MG PO TABS
5.0000 mg | ORAL_TABLET | Freq: Four times a day (QID) | ORAL | Status: DC | PRN
  Administered 2021-03-16: 10 mg via ORAL
  Filled 2021-03-07: qty 2

## 2021-03-07 MED ORDER — PROCHLORPERAZINE 25 MG RE SUPP: 12.5000 mg | Freq: Four times a day (QID) | RECTAL | Status: DC | PRN

## 2021-03-07 MED ORDER — GUAIFENESIN-DM 100-10 MG/5ML PO SYRP: 5.0000 mL | ORAL_SOLUTION | Freq: Four times a day (QID) | ORAL | Status: DC | PRN

## 2021-03-07 MED ORDER — DIPHENHYDRAMINE HCL 12.5 MG/5ML PO ELIX: 12.5000 mg | ORAL_SOLUTION | Freq: Four times a day (QID) | ORAL | Status: DC | PRN

## 2021-03-07 MED ORDER — PROCHLORPERAZINE EDISYLATE 10 MG/2ML IJ SOLN: 5.0000 mg | Freq: Four times a day (QID) | INTRAMUSCULAR | Status: DC | PRN

## 2021-03-07 MED ORDER — CARVEDILOL 12.5 MG PO TABS
25.0000 mg | ORAL_TABLET | Freq: Two times a day (BID) | ORAL | Status: DC
  Administered 2021-03-07 – 2021-03-16 (×18): 25 mg via ORAL
  Filled 2021-03-07 (×18): qty 2

## 2021-03-07 MED ORDER — TRAZODONE HCL 50 MG PO TABS
25.0000 mg | ORAL_TABLET | Freq: Every evening | ORAL | Status: DC | PRN
  Administered 2021-03-15: 50 mg via ORAL
  Filled 2021-03-07: qty 1

## 2021-03-07 MED ORDER — ASPIRIN 300 MG RE SUPP: 300.0000 mg | Freq: Every day | RECTAL | Status: DC

## 2021-03-07 MED ORDER — EXERCISE FOR HEART AND HEALTH BOOK
Freq: Once | Status: AC
  Filled 2021-03-07: qty 1

## 2021-03-07 MED ORDER — ROSUVASTATIN CALCIUM 20 MG PO TABS
40.0000 mg | ORAL_TABLET | Freq: Every day | ORAL | Status: DC
  Administered 2021-03-08 – 2021-03-16 (×9): 40 mg via ORAL
  Filled 2021-03-07 (×9): qty 2

## 2021-03-07 MED ORDER — LOSARTAN POTASSIUM 100 MG PO TABS: 100.0000 mg | ORAL_TABLET | Freq: Every day | ORAL | Status: DC

## 2021-03-07 MED ORDER — POLYETHYLENE GLYCOL 3350 17 G PO PACK: 17.0000 g | PACK | Freq: Every day | ORAL | Status: DC | PRN

## 2021-03-07 MED ORDER — ALUM & MAG HYDROXIDE-SIMETH 200-200-20 MG/5ML PO SUSP: 30.0000 mL | ORAL | Status: DC | PRN

## 2021-03-07 MED ORDER — EMPAGLIFLOZIN 25 MG PO TABS
25.0000 mg | ORAL_TABLET | Freq: Every day | ORAL | Status: DC
  Administered 2021-03-08 – 2021-03-11 (×4): 25 mg via ORAL
  Filled 2021-03-07 (×4): qty 1

## 2021-03-07 MED ORDER — BLOOD PRESSURE CONTROL BOOK
Freq: Once | Status: AC
  Filled 2021-03-07: qty 1

## 2021-03-07 MED ORDER — POTASSIUM CHLORIDE CRYS ER 20 MEQ PO TBCR
20.0000 meq | EXTENDED_RELEASE_TABLET | Freq: Once | ORAL | Status: AC
  Administered 2021-03-07: 20 meq via ORAL
  Filled 2021-03-07: qty 1

## 2021-03-07 MED ORDER — EMPAGLIFLOZIN 25 MG PO TABS: 25.0000 mg | ORAL_TABLET | Freq: Every day | ORAL | Status: DC

## 2021-03-07 MED ORDER — INSULIN ASPART 100 UNIT/ML IJ SOLN
0.0000 [IU] | Freq: Every day | INTRAMUSCULAR | Status: DC
Start: 2021-03-07 — End: 2021-03-16
  Administered 2021-03-07 – 2021-03-10 (×2): 2 [IU] via SUBCUTANEOUS
  Administered 2021-03-11 – 2021-03-12 (×2): 4 [IU] via SUBCUTANEOUS
  Administered 2021-03-13: 5 [IU] via SUBCUTANEOUS
  Administered 2021-03-14: 3 [IU] via SUBCUTANEOUS
  Administered 2021-03-15: 2 [IU] via SUBCUTANEOUS

## 2021-03-07 MED ORDER — CLOPIDOGREL BISULFATE 75 MG PO TABS
75.0000 mg | ORAL_TABLET | Freq: Every day | ORAL | Status: DC
  Administered 2021-03-08 – 2021-03-16 (×9): 75 mg via ORAL
  Filled 2021-03-07 (×9): qty 1

## 2021-03-07 MED ORDER — CARVEDILOL 25 MG PO TABS
25.0000 mg | ORAL_TABLET | Freq: Two times a day (BID) | ORAL | Status: DC
  Administered 2021-03-07: 25 mg via ORAL
  Filled 2021-03-07: qty 1

## 2021-03-07 MED ORDER — BISACODYL 10 MG RE SUPP: 10.0000 mg | Freq: Every day | RECTAL | Status: DC | PRN

## 2021-03-07 MED ORDER — LIVING WELL WITH DIABETES BOOK
Freq: Once | Status: AC
  Filled 2021-03-07: qty 1

## 2021-03-07 MED ORDER — ENOXAPARIN SODIUM 60 MG/0.6ML IJ SOSY
55.0000 mg | PREFILLED_SYRINGE | INTRAMUSCULAR | Status: DC
  Administered 2021-03-08 – 2021-03-14 (×7): 55 mg via SUBCUTANEOUS
  Filled 2021-03-07 (×8): qty 0.55

## 2021-03-07 MED ORDER — INSULIN ASPART 100 UNIT/ML IJ SOLN
5.0000 [IU] | Freq: Three times a day (TID) | INTRAMUSCULAR | Status: DC
  Administered 2021-03-07 – 2021-03-14 (×20): 5 [IU] via SUBCUTANEOUS

## 2021-03-07 MED ORDER — ACETAMINOPHEN 325 MG PO TABS
325.0000 mg | ORAL_TABLET | ORAL | Status: DC | PRN
  Administered 2021-03-07 – 2021-03-10 (×5): 650 mg via ORAL
  Filled 2021-03-07 (×5): qty 2

## 2021-03-07 MED ORDER — ASPIRIN 81 MG PO CHEW: 81.0000 mg | CHEWABLE_TABLET | Freq: Every day | ORAL | 0 refills | Status: DC

## 2021-03-07 MED ORDER — CHLORTHALIDONE 25 MG PO TABS: 25.0000 mg | ORAL_TABLET | Freq: Every day | ORAL | Status: DC

## 2021-03-07 MED ORDER — METFORMIN HCL ER 500 MG PO TB24: 500.0000 mg | ORAL_TABLET | Freq: Two times a day (BID) | ORAL | Status: DC

## 2021-03-07 MED ORDER — CHLORTHALIDONE 25 MG PO TABS
25.0000 mg | ORAL_TABLET | Freq: Every day | ORAL | Status: DC
  Filled 2021-03-07: qty 1

## 2021-03-07 MED ORDER — POTASSIUM CHLORIDE CRYS ER 20 MEQ PO TBCR
20.0000 meq | EXTENDED_RELEASE_TABLET | Freq: Two times a day (BID) | ORAL | Status: DC
  Administered 2021-03-07 – 2021-03-16 (×18): 20 meq via ORAL
  Filled 2021-03-07 (×18): qty 1

## 2021-03-07 MED ORDER — FLEET ENEMA 7-19 GM/118ML RE ENEM
1.0000 | ENEMA | Freq: Once | RECTAL | Status: DC | PRN
Start: 2021-03-07 — End: 2021-03-16

## 2021-03-07 MED ORDER — ASPIRIN 81 MG PO CHEW
81.0000 mg | CHEWABLE_TABLET | Freq: Every day | ORAL | Status: DC
  Administered 2021-03-08 – 2021-03-16 (×9): 81 mg via ORAL
  Filled 2021-03-07 (×10): qty 1

## 2021-03-07 MED ORDER — INSULIN ASPART 100 UNIT/ML IJ SOLN
0.0000 [IU] | Freq: Three times a day (TID) | INTRAMUSCULAR | Status: DC
Start: 2021-03-07 — End: 2021-03-16
  Administered 2021-03-07 – 2021-03-08 (×3): 5 [IU] via SUBCUTANEOUS
  Administered 2021-03-08: 3 [IU] via SUBCUTANEOUS
  Administered 2021-03-09 (×2): 5 [IU] via SUBCUTANEOUS
  Administered 2021-03-09: 3 [IU] via SUBCUTANEOUS
  Administered 2021-03-10: 2 [IU] via SUBCUTANEOUS
  Administered 2021-03-10 – 2021-03-11 (×3): 5 [IU] via SUBCUTANEOUS
  Administered 2021-03-11: 2 [IU] via SUBCUTANEOUS
  Administered 2021-03-11 – 2021-03-13 (×5): 3 [IU] via SUBCUTANEOUS
  Administered 2021-03-13: 5 [IU] via SUBCUTANEOUS
  Administered 2021-03-13 – 2021-03-15 (×5): 3 [IU] via SUBCUTANEOUS
  Administered 2021-03-15: 2 [IU] via SUBCUTANEOUS
  Administered 2021-03-15: 5 [IU] via SUBCUTANEOUS
  Administered 2021-03-16 (×2): 3 [IU] via SUBCUTANEOUS

## 2021-03-07 NOTE — Discharge Instructions (Addendum)
You were admitted to the hospital and found to have a stroke.  The brain doctors saw you as well while you were here, they are recommending that you continue taking aspirin and Plavix together until November 6.  On November 6, you will take aspirin by itself as prescribed but you will not take Plavix.  Please continue taking Crestor as prescribed.  Your family can bring your Victoza to the hospital if need be while you are receiving inpatient rehabilitation.  All other medications listed, please take as prescribed.  Please arrange a follow-up in the stroke clinic for 1 month from now.  Please follow-up with your primary care provider in the next few days-1 week to discuss your recent visit to the hospital.  Otherwise call your doctor for any new numbness or new major weakness in the limbs.

## 2021-03-07 NOTE — H&P (Signed)
p   Physical Medicine and Rehabilitation Admission H&P    Chief Complaint  Patient presents with   Stroke with functional deficits.     HPI:  Thomas Gardner. [name] is a 65 year old Rh-male with  history of T2DM, HFrEF, HTN, OSA, headaches, morbid obesity, recent wisdom tooth extraction who was admitted on 03/02/2021 with reports of persistent HA, dizziness, and difficulty walking with tendency to fall to the left.  History taken from patient and chart review.  UDS positive for opiates. CTA head done revealing multifocal moderate/severe stenosis of right PCA, severe stenosis proximal L-ECA, incidental 3.6 cm right thyroid nodule and was negative for LVO.  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  Echocardiogram with ejection fraction of 50%, trivial MVR and TVR, no wall abnormality and low normal LVF. 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 recommended DAPT X 3 weeks followed by ASA alone as well control of risk factors. He has been followed by teaching service and thyroid ultrasound done showing slight interval increase in complex cystic mass in midline of neck with recommendations for ENT referral and stable right thyroid nodule.    Hospital course has been complicated by labile blood pressures and home meds slowly resumed. Patient with resultant dizziness, tremors with Left knee instability, weakness left hand?, shuffling/scissoring gait and balance deficits affecting mobility and ADLs. CIR recommended due to functional decline.  Please see preadmission assessment from earlier today as well.   Review of Systems  Constitutional:  Negative for chills and fever.  HENT:  Negative for hearing loss and tinnitus.   Eyes:  Positive for blurred vision. Negative for pain.  Respiratory:  Positive for shortness of breath (not as bad--DOE). Negative for cough.   Cardiovascular:  Negative for chest pain and  palpitations.  Gastrointestinal:  Positive for constipation. Negative for heartburn and nausea.  Genitourinary:  Negative for dysuria and urgency.  Musculoskeletal:  Positive for back pain and joint pain (knee pain --bilateral mensical tears.). Negative for myalgias and neck pain.  Skin:  Negative for rash.  Neurological:  Positive for dizziness and weakness. Negative for headaches.  Psychiatric/Behavioral:  Negative for memory loss.   All other systems reviewed and are negative.   Past Medical History:  Diagnosis Date   Arthritis    Asthma    CHF (congestive heart failure) ([place])    Diabetes mellitus 2003   GERD (gastroesophageal reflux disease)    Hyperlipidemia    Hypertension    Morbid obesity ([place])    Sleep apnea     Past Surgical History:  Procedure Laterality Date   BREATH TEK H PYLORI  11/11/2011   Procedure: BREATH TEK H PYLORI;  Surgeon: Shann Medal, MD;  Location: [facility];  Service: General;  Laterality: N/A;   CARDIAC CATHETERIZATION     ESOPHAGOGASTRODUODENOSCOPY (EGD) WITH PROPOFOL N/A 05/04/2014   Procedure: ESOPHAGOGASTRODUODENOSCOPY (EGD) WITH PROPOFOL;  Surgeon: Cleotis Nipper, MD;  Location: [facility] ENDOSCOPY;  Service: Endoscopy;  Laterality: N/A;    Family History  Problem Relation Age of Onset   Cancer Mother        ovarian   Diabetes Mother    Stroke Father    Cancer Maternal Aunt        breast   Diabetes Maternal Aunt    Cancer Maternal Uncle        colon    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 He reports that he has never smoked. He has never used smokeless tobacco. He reports that he does not drink alcohol and does not use drugs.   Allergies  Allergen Reactions   Erythromycin Nausea And Vomiting    Medications Prior to Admission  Medication Sig Dispense Refill   acetaminophen (TYLENOL) 650 MG CR tablet Take 325 mg by mouth every 8 (eight) hours as needed for pain.     albuterol (PROVENTIL HFA;VENTOLIN HFA) 108 (90  Base) MCG/ACT inhaler Inhale 2 puffs into the lungs every 6 (six) hours as needed for wheezing or shortness of breath. MAP pharmacy 3 Inhaler 1   carvedilol (COREG) 25 MG tablet Take 1 tablet (25 mg total) by mouth 2 (two) times daily with a meal. 180 tablet 1   chlorthalidone (HYGROTON) 25 MG tablet TAKE 1 TABLET (25 MG TOTAL) BY MOUTH DAILY. (Patient taking differently: Take 25 mg by mouth daily.) 90 tablet 1   cholecalciferol (VITAMIN D3) 25 MCG (1000 UNIT) tablet Take 1,000 Units by mouth daily.     empagliflozin (JARDIANCE) 10 MG TABS tablet Take 1 tablet (10 mg total) by mouth daily. 30 tablet 2   insulin glargine (LANTUS SOLOSTAR) 100 UNIT/ML Solostar Pen Inject 20 Units into the skin 2 (two) times daily. (Patient taking differently: Inject 25 Units into the skin 2 (two) times daily.) 12 mL 2   insulin lispro (HUMALOG KWIKPEN) 100 UNIT/ML KwikPen Inject 18 Units into the skin 3 (three) times daily. 15 mL 0   liraglutide (VICTOZA) 18 MG/3ML SOPN Inject 1.8 mg into the skin daily. 9 mL 2   losartan (COZAAR) 100 MG tablet TAKE 1 TABLET (100 MG TOTAL) BY MOUTH DAILY. (Patient taking differently: Take 100 mg by mouth daily.) 90 tablet 3   vitamin C (ASCORBIC ACID) 500 MG tablet Take 500 mg by mouth daily.     amoxicillin (AMOXIL) 500 MG capsule Take 1 capsule (500 mg total) by mouth 4 (four) times daily. (Patient not taking: No sig reported) 28 capsule 0   doxycycline (VIBRAMYCIN) 100 MG capsule TAKE 1 CAPSULE (100 MG TOTAL) BY MOUTH 2 (TWO) TIMES DAILY FOR 7 DAYS. (Patient not taking: No sig reported) 14 capsule 0   glucose monitoring kit (FREESTYLE) monitoring kit 1 each by Does not apply route as needed for other. 1 each 0   HYDROcodone-acetaminophen (NORCO/VICODIN) 5-325 MG tablet Take 1 tablet by mouth 4 (four) times daily. (Patient not taking: Reported on 03/03/2021) 12 tablet 0   Insulin Pen Needle (UNIFINE PENTIPS) 31G X 5 MM MISC Place at tip of insulin pen and use as directed. 100 each 1    metFORMIN (GLUCOPHAGE-XR) 500 MG 24 hr tablet TAKE 1 TABLET (500 MG TOTAL) BY MOUTH DAILY WITH BREAKFAST. (Patient not taking: No sig reported) 30 tablet 3   PROVENTIL HFA 108 (90 Base) MCG/ACT inhaler INHALE 2 PUFFS INTO THE LUNGS EVERY 6 HOURS AS NEEDED FOR WHEEZING ORSHORTNESS OF BREATH. (Patient not taking: Reported on 03/03/2021) 6.7 g 12   rosuvastatin (CRESTOR) 40 MG tablet TAKE 1 TABLET (40 MG TOTAL) BY MOUTH DAILY. (Patient not taking: No sig reported) 30 tablet 3   ZETIA 10 MG tablet TAKE 1 TABLET (10MG TOTAL) BY MOUTH DAILY. (Patient not taking: No sig reported) 90 tablet 2    Drug Regimen Review  Drug regimen was reviewed and remains appropriate with no significant issues identified  Home: Home Living Family/patient expects to be discharged to:: Private residence Living Arrangements: Alone Available Help at  Discharge: Friend(s), Available PRN/intermittently Type of Home: House Home Access: Stairs to enter [employer] of Steps: 2 Entrance Stairs-Rails: None Home Layout: One level Bathroom Shower/Tub: Chiropodist: Handicapped height Home Equipment: Grab bars - tub/shower, Grab bars - toilet Additional Comments: pt reports he is moving into his parents (they're deceased) house and can't go back to his apartment because there are too many boxes. 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)  Functional Status:  Mobility: Bed Mobility Overal bed mobility: Needs Assistance Bed Mobility: Supine to Sit Supine to sit: Supervision General bed mobility comments: pt received EOB upon PT entry Transfers Overall transfer level: Needs assistance Equipment used: Rolling walker (2 wheeled) Transfers: Sit to/from Stand Sit to Stand: Min assist General transfer comment: minA to  steady due to onset of tremors/shakiness and knee instability/buckling Ambulation/Gait Ambulation/Gait assistance: Min Web designer (Feet): 60 Feet Assistive device: Rolling walker (2 wheeled) Gait Pattern/deviations: Step-to pattern, Step-through pattern, Decreased stride length, Narrow base of support General Gait Details: pt with varied gait. Pt initially with very narrow, near scissoring, base of support and short shuffled steps. With verbal cues to increase distance between feet and step length pt improved but couldn't maintain more than 5 prior to reverting back to shuffled narrow gait pattern, pt with 2 episodes of L knee buckling requiring modA to preven fall. Pt remains dependent on UEs as well Gait velocity: very slow and guarded Gait velocity interpretation: <1.8 ft/sec, indicate of risk for recurrent falls    ADL: ADL Overall ADL's : Needs assistance/impaired Grooming: Minimal assistance, Standing, Wash/dry hands Grooming Details (indicate cue type and reason): min assist without UE support, leaning on sink with min guard and relies on external support Upper Body Dressing : Minimal assistance, Sitting Lower Body Dressing: Minimal assistance, Sit to/from stand Lower Body Dressing Details (indicate cue type and reason): able to don/doff socks, min assist in standing Toilet Transfer: Ambulation, RW, Min guard Toileting- Clothing Manipulation and Hygiene: Minimal assistance, Sit to/from stand Functional mobility during ADLs: Minimal assistance, Rolling walker General ADL Comments: cueing during ADls for RW safety, tremors noted and shuffled gait requiring cueing for increased step length  Cognition: Cognition Overall Cognitive Status: Within Functional Limits for tasks assessed Arousal/Alertness: Awake/alert Orientation Level: Oriented X4, Oriented to place, Oriented to person, Oriented to time, Oriented to situation Attention: Selective Selective Attention: Appears  intact Memory: Appears intact Awareness: Appears intact Problem Solving: Appears intact Cognition Arousal/Alertness: Awake/alert Behavior During Therapy: WFL for tasks assessed/performed Overall Cognitive Status: Within Functional Limits for tasks assessed General Comments: motivated to get better, anxious regarding home situation and d/c plan   Blood pressure 137/84, pulse 96, temperature 98 F (36.7 C), temperature source Oral, resp. rate 19, height _0  (1.676 m), weight 110.6 kg, SpO2 98 %. Physical Exam Vitals and nursing note reviewed.  Constitutional:      Appearance: Normal appearance. He is obese.  HENT:     Head: Normocephalic and atraumatic.     Right Ear: External ear normal.     Left Ear: External ear normal.     Nose: Nose normal.  Eyes:     General:        Right eye: No discharge.        Left eye: No discharge.     Extraocular Movements: Extraocular movements  intact.  Cardiovascular:     Rate and Rhythm: Normal rate and regular rhythm.  Pulmonary:     Effort: Pulmonary effort is normal. No respiratory distress.     Breath sounds: Normal breath sounds.  Abdominal:     General: Abdomen is flat. Bowel sounds are normal. There is no distension.  Musculoskeletal:     Cervical back: Normal range of motion and neck supple.     Comments: No edema or tenderness in extremities  Skin:    General: Skin is warm and dry.  Neurological:     Mental Status: He is alert and oriented to person, place, and time.     Comments: Alert Motor: 4-/5 throughout  Psychiatric:        Mood and Affect: Affect is flat.        Speech: Speech normal.    Results for orders placed or performed during the hospital encounter of 03/02/21 (from the past 48 hour(s))  Glucose, capillary     Status: Abnormal   Collection Time: 03/05/21 11:24 AM  Result Value Ref Range   Glucose-Capillary 274 (H) 70 - 99 mg/dL    Comment: Glucose reference range applies only to samples taken after fasting  for at least 8 hours.  Glucose, capillary     Status: Abnormal   Collection Time: 03/05/21 12:52 PM  Result Value Ref Range   Glucose-Capillary 186 (H) 70 - 99 mg/dL    Comment: Glucose reference range applies only to samples taken after fasting for at least 8 hours.  Glucose, capillary     Status: Abnormal   Collection Time: 03/05/21  4:55 PM  Result Value Ref Range   Glucose-Capillary 151 (H) 70 - 99 mg/dL    Comment: Glucose reference range applies only to samples taken after fasting for at least 8 hours.  Glucose, capillary     Status: Abnormal   Collection Time: 03/05/21 10:09 PM  Result Value Ref Range   Glucose-Capillary 186 (H) 70 - 99 mg/dL    Comment: Glucose reference range applies only to samples taken after fasting for at least 8 hours.  Basic metabolic panel     Status: Abnormal   Collection Time: 03/06/21  2:53 AM  Result Value Ref Range   Sodium 134 (L) 135 - 145 mmol/L   Potassium 3.2 (L) 3.5 - 5.1 mmol/L   Chloride 101 98 - 111 mmol/L   CO2 26 22 - 32 mmol/L   Glucose, Bld 189 (H) 70 - 99 mg/dL    Comment: Glucose reference range applies only to samples taken after fasting for at least 8 hours.   BUN 20 8 - 23 mg/dL   Creatinine, Ser 1.08 0.61 - 1.24 mg/dL   Calcium 9.0 8.9 - 10.3 mg/dL   GFR, Estimated >60 >60 mL/min    Comment: (NOTE) Calculated using the CKD-EPI Creatinine Equation (2021)    Anion gap 7 5 - 15    Comment: Performed at [facility] [address] [place], [place] [address]  Glucose, capillary     Status: Abnormal   Collection Time: 03/06/21  7:25 AM  Result Value Ref Range   Glucose-Capillary 178 (H) 70 - 99 mg/dL    Comment: Glucose reference range applies only to samples taken after fasting for at least 8 hours.  Glucose, capillary     Status: Abnormal   Collection Time: 03/06/21 11:22 AM  Result Value Ref Range   Glucose-Capillary 272 (H) 70 - 99 mg/dL  Comment: Glucose reference range applies only to samples taken after  fasting for at least 8 hours.  Glucose, capillary     Status: Abnormal   Collection Time: 03/06/21  1:18 PM  Result Value Ref Range   Glucose-Capillary 189 (H) 70 - 99 mg/dL    Comment: Glucose reference range applies only to samples taken after fasting for at least 8 hours.  Glucose, capillary     Status: Abnormal   Collection Time: 03/06/21  4:36 PM  Result Value Ref Range   Glucose-Capillary 312 (H) 70 - 99 mg/dL    Comment: Glucose reference range applies only to samples taken after fasting for at least 8 hours.  Glucose, capillary     Status: Abnormal   Collection Time: 03/06/21  8:04 PM  Result Value Ref Range   Glucose-Capillary 240 (H) 70 - 99 mg/dL    Comment: Glucose reference range applies only to samples taken after fasting for at least 8 hours.  Basic metabolic panel     Status: Abnormal   Collection Time: 03/07/21  2:52 AM  Result Value Ref Range   Sodium 136 135 - 145 mmol/L   Potassium 3.8 3.5 - 5.1 mmol/L   Chloride 102 98 - 111 mmol/L   CO2 22 22 - 32 mmol/L   Glucose, Bld 151 (H) 70 - 99 mg/dL    Comment: Glucose reference range applies only to samples taken after fasting for at least 8 hours.   BUN 23 8 - 23 mg/dL   Creatinine, Ser 1.18 0.61 - 1.24 mg/dL   Calcium 9.2 8.9 - 10.3 mg/dL   GFR, Estimated >60 >60 mL/min    Comment: (NOTE) Calculated using the CKD-EPI Creatinine Equation (2021)    Anion gap 12 5 - 15    Comment: Performed at [facility] [address] [place], [place] [address]  Glucose, capillary     Status: Abnormal   Collection Time: 03/07/21  8:00 AM  Result Value Ref Range   Glucose-Capillary 181 (H) 70 - 99 mg/dL    Comment: Glucose reference range applies only to samples taken after fasting for at least 8 hours.   No results found.     Medical Problem List and Plan: 1. Dizziness, tremors, left knee instability, weakness left hand?, shuffling/scissoring gait, and balance deficits secondary to ischemia in left medullary  pyramid.  -patient may shower  -ELOS/Goals: 7-10 days/supervision/mod I  Admit to CIR 2.  Antithrombotics: -DVT/anticoagulation:  Pharmaceutical: Lovenox  -antiplatelet therapy: DAPT X 3 weeks followed by ASA alone.  3. Chronic HA/Pain Management: Use tylenol prn.  4. Mood: LCSW to follow for evaluation and support.   -antipsychotic agents: N/a 5. Neuropsych: This patient is capable of making decisions on his own behalf. 6. Skin/Wound Care: Routine pressure relief measures.  7. Fluids/Electrolytes/Nutrition: Monitor I/Os  --CMP ordered for tomorrow 8. Chronic HFrEF/HTN:  On Losartan, Coreg, hygroton, Crestor and Jardiance.  -- Daily weights for signs of overload.   --has seen Dr. Einar Gip in the past.  9.T2DM: Hgb A1C- 9.9 and poorly controlled as he feels weak if BS <200.   --Continue Jardiance, d/c metformin (he has been refusing and does not plan on taking it at home), insulin glargine  as at home and novolog tid AC  --Monitor with increased activity 10.  Dyslipidemia: LDL-121. Was non-compliant with meds. Continue to educate on compliance.   --On Crestor and Zetia.  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 13. HTN: Now back on home  regimen of Cozaar, hygroton, Coreg. --will order orthostatic vitals.   --Monitor with increased mobility 14. Hypokalemia: Will add low dose supplement for recurrent hypokalemia (hygroton on board).   CMP ordered for tomorrow. 15. OSA: Machine broke/lack of insurance--will need sleep study after discharge.     Bary Leriche, PA-C 03/07/2021  I have personally performed a face to face diagnostic evaluation, including, but not limited to relevant history and physical exam findings, of this patient and developed relevant assessment and plan.  Additionally, I have reviewed and concur with the physician assistant's documentation above.  Delice Lesch, MD, ABPMR The patient's status has not changed. Any changes from the pre-admission screening or documentation from  the acute chart are noted above.   Delice Lesch, MD, ABPMR

## 2021-03-07 NOTE — Progress Notes (Addendum)
PMR Admission Coordinator Pre-Admission Assessment   Patient: Thomas Gardner is an 65 y.o., male MRN: [id number] DOB: [birth year] Height: '5\' 6"'  (167.6 cm) Weight: 110.6 kg   Insurance Information HMO: yes     PPO:      PCP:      IPA:      80/20:      OTHER:  PRIMARY: [employer]  Medicare      Policy#: [id number]      Subscriber: Pt.  CM Name: n/a       Phone#: [phone]     Fax#: [phone] Pre-Cert#:[id number]  Employer:  I received a call from [place] at [facility] granting approval on 03/06/21 for admission 03/07/21 for 7 days with updates due 03/13/21 Benefits:  Phone #: [phone]     Name:  Irene Shipper Date: 01/18/2021 - present Deductible: $0 (does not have deductible) OOP Max: $3,600 ($115 met) CIR: $295/day co-pay for days 1-5, $0/day co-pay for days 6+ SNF: $0.00 Copayment per day for days 1-20; $188.00 Copayment per day for days 21-40; $0.00 Copayment per day for days 41-100 for Medicare-covered care/maximum 100 days/benefit period Outpatient: $30/visit co-pay Home Health:  100% coverage, 0% co-insurance; limited by medical necessity DME: 80% coverage; 20% co-insurance Providers: in network    SECONDARY:       Policy#:      Phone#:    The "Data Collection Information Summary" for patients in Inpatient Rehabilitation Facilities with attached "Privacy Act [place] Records" was provided and verbally reviewed with: Patient   Emergency Contact Information Contact Information       Name Relation Home Work Mobile    [place] Sister [phone]   [phone]           Current Medical History  Patient Admitting Diagnosis: CVA History of Present Illness: Pt. 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 Pt. With significant dizziness throughout admission, felt  that Small acute thalamic stroke versus PCA stenosis contributing to his dizziness. CIR was consulted to assist return to PLOF.      Complete NIHSS TOTAL: 0   Patient's medical record from [facility] has been reviewed by the rehabilitation admission coordinator and physician.   Past Medical History      Past Medical History:  Diagnosis Date   Arthritis     Asthma     CHF (congestive heart failure) ([place])     Diabetes mellitus 2003   GERD (gastroesophageal reflux disease)     Hyperlipidemia     Hypertension     Morbid obesity ([place])     Sleep apnea        Has the patient had major surgery during 100 days prior to admission? No   Family History   family history includes Cancer in his maternal aunt, maternal uncle, and mother; Diabetes in his maternal aunt and mother; Stroke in his father.   Current Medications   Current Facility-Administered Medications:    amoxicillin-clavulanate (AUGMENTIN) 875-125 MG per tablet 1 tablet, 1 tablet, Oral, Q12H, Iona Beard, MD, 1 tablet at 03/06/21 0815   aspirin chewable tablet 81 mg, 81 mg, Oral, Daily, 81 mg at 03/06/21 0826 **OR** aspirin suppository 300 mg, 300 mg, Rectal, Daily, Gaylan Gerold, DO   chlorthalidone (HYGROTON) tablet 25 mg, 25 mg, Oral, Daily, Orvis Brill,  MD, 25 mg at 03/06/21 0831   clopidogrel (PLAVIX) tablet 75 mg, 75 mg, Oral, Daily, Gaylan Gerold, DO, 75 mg at 03/06/21 0817   empagliflozin (JARDIANCE) tablet 25 mg, 25 mg, Oral, Daily, Iona Beard, MD, 25 mg at 03/06/21 0817   enoxaparin (LOVENOX) injection 55 mg, 55 mg, Subcutaneous, Q24H, Gaylan Gerold, DO, 55 mg at 03/06/21 0826   insulin aspart (novoLOG) injection 0-15 Units, 0-15 Units, Subcutaneous, TID WC, Gaylan Gerold, DO, 3 Units at 03/06/21 1342   insulin aspart (novoLOG) injection 0-5 Units, 0-5 Units, Subcutaneous, QHS, Gaylan Gerold, DO   insulin aspart (novoLOG) injection 5 Units, 5 Units, Subcutaneous, TID WC, Gaylan Gerold, DO, 5  Units at 03/06/21 1342   insulin detemir (LEVEMIR) injection 15 Units, 15 Units, Subcutaneous, BID, Gaylan Gerold, DO, 15 Units at 03/06/21 0825   losartan (COZAAR) tablet 100 mg, 100 mg, Oral, Daily, Iona Beard, MD, 100 mg at 03/06/21 0818   metFORMIN (GLUCOPHAGE-XR) 24 hr tablet 500 mg, 500 mg, Oral, BID WC, Iona Beard, MD   potassium chloride SA (KLOR-CON) CR tablet 40 mEq, 40 mEq, Oral, BID, Orvis Brill, MD, 40 mEq at 03/06/21 0815   rosuvastatin (CRESTOR) tablet 40 mg, 40 mg, Oral, Daily, Gaylan Gerold, DO, 40 mg at 03/06/21 2229   Patients Current Diet:  Diet Order                  Diet Carb Modified Fluid consistency: Thin; Room service appropriate? Yes  Diet effective now                         Precautions / Restrictions Precautions Precautions: Fall Precaution Comments: tremors/shakiness Restrictions Weight Bearing Restrictions: No    Has the patient had 2 or more falls or a fall with injury in the past year? No   Prior Activity Level Community (5-7x/wk): Active in the community PTA   Prior Functional Level Self Care: Did the patient need help bathing, dressing, using the toilet or eating? Independent   Indoor Mobility: Did the patient need assistance with walking from room to room (with or without device)? Independent   Stairs: Did the patient need assistance with internal or external stairs (with or without device)? Independent   Functional Cognition: Did the patient need help planning regular tasks such as shopping or remembering to take medications? Independent   Patient Information Are you of Hispanic, Latino/a,or Spanish origin?: A. No, not of Hispanic, Latino/a, or Spanish origin What is your race?: B. Black or African American Do you need or want an interpreter to communicate with a doctor or health care staff?: 0. No   Patient's Response To:  Health Literacy and Transportation Is the patient able to respond to health literacy and  transportation needs?: Yes Health Literacy - How often do you need to have someone help you when you read instructions, pamphlets, or other written material from your doctor or pharmacy?: Never In the past 12 months, has lack of transportation kept you from medical appointments or from getting medications?: No In the past 12 months, has lack of transportation kept you from meetings, work, or from getting things needed for daily living?: No   Development worker, international aid / [place] Devices/Equipment: None Home Equipment: Grab bars - tub/shower, Grab bars - toilet   Prior Device Use: Indicate devices/aids used by the patient prior to current illness, exacerbation or injury? None of the above   Current Functional Level Cognition  Arousal/Alertness: Awake/alert Overall Cognitive Status: Within Functional Limits for tasks assessed Orientation Level: Oriented X4 General Comments: motivated to get better, anxious regarding home situation and d/c plan Attention: Selective Selective Attention: Appears intact Memory: Appears intact Awareness: Appears intact Problem Solving: Appears intact    Extremity Assessment (includes Sensation/Coordination)   Upper Extremity Assessment: LUE deficits/detail LUE Deficits / Details: grossly 4-/5 (mildly weaker) with decreased coordination LUE Sensation: WNL LUE Coordination: decreased gross motor, decreased fine motor  Lower Extremity Assessment: Defer to PT evaluation LLE Deficits / Details: pt with noted buckling during standing and walking, grossly 4/5 LLE Sensation: decreased light touch LLE Coordination: decreased gross motor     ADLs   Overall ADL's : Needs assistance/impaired Grooming: Minimal assistance, Standing, Wash/dry hands Grooming Details (indicate cue type and reason): min assist without UE support, leaning on sink with min guard and relies on external support Upper Body Dressing : Minimal assistance, Sitting Lower Body  Dressing: Minimal assistance, Sit to/from stand Lower Body Dressing Details (indicate cue type and reason): able to don/doff socks, min assist in standing Toilet Transfer: Ambulation, RW, Min guard Toileting- Clothing Manipulation and Hygiene: Minimal assistance, Sit to/from stand Functional mobility during ADLs: Minimal assistance, Rolling walker General ADL Comments: cueing during ADls for RW safety, tremors noted and shuffled gait requiring cueing for increased step length     Mobility   Overal bed mobility: Needs Assistance Bed Mobility: Supine to Sit Supine to sit: Supervision General bed mobility comments: pt received EOB upon PT entry     Transfers   Overall transfer level: Needs assistance Equipment used: Rolling walker (2 wheeled) Transfers: Sit to/from Stand Sit to Stand: Min assist General transfer comment: minA to steady due to onset of tremors/shakiness and knee instability/buckling     Ambulation / Gait / Stairs / Wheelchair Mobility   Ambulation/Gait Ambulation/Gait assistance: Herbalist (Feet): 60 Feet Assistive device: Rolling walker (2 wheeled) Gait Pattern/deviations: Step-to pattern, Step-through pattern, Decreased stride length, Narrow base of support General Gait Details: pt with varied gait. Pt initially with very narrow, near scissoring, base of support and short shuffled steps. With verbal cues to increase distance between feet and step length pt improved but couldn't maintain more than 5 prior to reverting back to shuffled narrow gait pattern, pt with 2 episodes of L knee buckling requiring modA to preven fall. Pt remains dependent on UEs as well Gait velocity: very slow and guarded Gait velocity interpretation: <1.8 ft/sec, indicate of risk for recurrent falls     Posture / Balance Balance Overall balance assessment: Needs assistance Sitting-balance support: Feet supported, No upper extremity supported Sitting balance-Leahy Scale:  Good Standing balance support: Bilateral upper extremity supported, During functional activity, No upper extremity supported Standing balance-Leahy Scale: Poor Standing balance comment: pt relies on BUE support dynamically, L lateral lean with fatigue     Special needs/care consideration Skin intact and Diabetic management Novolog 0-5 units sQ3 sx a day with meals,0-5 units daily at bedtime. Levemir 15 units sQ 2x daily     Previous Home Environment (from acute therapy documentation) Living Arrangements: Alone  Lives With: Alone Available Help at Discharge: Friend(s), Available PRN/intermittently Type of Home: House Home Layout: One level Home Access: Stairs to enter Entrance Stairs-Rails: None Entrance Stairs-Number of Steps: 2 Bathroom Shower/Tub: Chiropodist: Handicapped height Home Care Services: No Additional Comments: pt reports he is moving into his parents (they're deceased) house and can't go back to his apartment because there  are too many boxes. Pt states he would go home with 2 of his friends   Discharge Living Setting Plans for Discharge Living Setting: House Type of Home at Discharge: House Discharge Home Layout: Two level Alternate Level Stairs-Rails: Can reach both Alternate Level Stairs-Number of Steps: full flight Discharge Home Access: Stairs to enter Entrance Stairs-Rails: None Entrance Stairs-Number of Steps: 1 Discharge Bathroom Shower/Tub: Tub/shower unit Discharge Bathroom Toilet: Standard Discharge Bathroom Accessibility: Yes How Accessible: Accessible via walker Does the patient have any problems obtaining your medications?: No   Social/Family/Support Systems Patient Roles: Other (Comment) Contact Information: [phone] Anticipated Caregiver: Cord[place] (friend) Anticipated Caregiver's Contact Information: [phone] Ability/Limitations of Caregiver: Min A Caregiver Availability: Intermittent Discharge Plan Discussed with  Primary Caregiver: No Is Caregiver In Agreement with Plan?: Yes   Goals Patient/Family Goal for Rehab: PT/OT Mod I Expected length of stay: 5-7 days Pt/Family Agrees to Admission and willing to participate: Yes Program Orientation Provided & Reviewed with Pt/Caregiver Including Roles  & Responsibilities: Yes   Decrease burden of Care through IP rehab admission: Specialzed equipment needs, Decrease number of caregivers, Bowel and bladder program, and Patient/family education   Possible need for SNF placement upon discharge: not anticipated.    Patient Condition: I have reviewed medical records from [facility] , spoken with CM, and patient. I met with patient at the bedside for inpatient rehabilitation assessment.  Patient will benefit from ongoing PT and OT, can actively participate in 3 hours of therapy a day 5 days of the week, and can make measurable gains during the admission.  Patient will also benefit from the coordinated team approach during an Inpatient Acute Rehabilitation admission.  The patient will receive intensive therapy as well as Rehabilitation physician, nursing, social worker, and care management interventions.  Due to safety, disease management, medication administration, and patient education the patient requires 24 hour a day rehabilitation nursing.  The patient is currently min A-min G with mobility and basic ADLs.  Discharge setting and therapy post discharge at home with home health is anticipated.  Patient has agreed to participate in the Acute Inpatient Rehabilitation Program and will admit today.   Preadmission Screen Completed By:  Genella Mech, 03/06/2021 3:42 PM ______________________________________________________________________   Discussed status with Dr. Posey Pronto  on 03/07/21 at 69 and received approval for admission today.   Admission Coordinator:  Genella Mech, CCC-SLP, time 1000/Date 03/07/21    Assessment/Plan: Diagnosis: CVA Does the  need for close, 24 hr/day Medical supervision in concert with the patient's rehab needs make it unreasonable for this patient to be served in a less intensive setting? Yes Co-Morbidities requiring supervision/potential complications: HFrEF (EF 35%), uncontrolled type 2 diabetes, hypertension, hyperlipidemia Due to bowel management, safety, disease management, and patient education, does the patient require 24 hr/day rehab nursing? Yes Does the patient require coordinated care of a physician, rehab nurse, PT, OT to address physical and functional deficits in the context of the above medical diagnosis(es)? Yes Addressing deficits in the following areas: balance, endurance, locomotion, strength, transferring, bathing, dressing, toileting, and psychosocial support Can the patient actively participate in an intensive therapy program of at least 3 hrs of therapy 5 days a week? Yes The potential for patient to make measurable gains while on inpatient rehab is excellent and good Anticipated functional outcomes upon discharge from inpatient rehab: modified independent and supervision PT, modified independent and supervision OT, n/a SLP Estimated rehab length of stay to reach the above functional goals is: 7-10  days. Anticipated discharge destination: Home 10. Overall Rehab/Functional Prognosis: good     MD Signature: Delice Lesch, MD, ABPMR

## 2021-03-07 NOTE — Discharge Summary (Signed)
Name: Thomas Gardner MRN: [id number] DOB: [birth year] 65 y.o. PCP: Scarlett Presto, MD  Date of Admission: 03/02/2021  7:04 PM Date of Discharge: 03/07/2021 Attending Physician: Aldine Contes, MD  Discharge Diagnosis: 1.  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  Chronic HFrEF 3.  Hypertension 4.  Uncontrolled type 2 diabetes 5.  Hyperlipidemia 6.  Thyroid nodule  Discharge Medications: Allergies as of 03/07/2021       Reactions   Erythromycin Nausea And Vomiting        Medication List     STOP taking these medications    acetaminophen 650 MG CR tablet Commonly known as: TYLENOL   amoxicillin 500 MG capsule Commonly known as: AMOXIL   doxycycline 100 MG capsule Commonly known as: VIBRAMYCIN   HYDROcodone-acetaminophen 5-325 MG tablet Commonly known as: NORCO/VICODIN   Zetia 10 MG tablet Generic drug: ezetimibe       TAKE these medications    albuterol 108 (90 Base) MCG/ACT inhaler Commonly known as: VENTOLIN HFA Inhale 2 puffs into the lungs every 6 (six) hours as needed for wheezing or shortness of breath. [facility] What changed: Another medication with the same name was removed. Continue taking this medication, and follow the directions you see here.   aspirin 81 MG chewable tablet Chew 1 tablet (81 mg total) by mouth daily. Start taking on: March 08, 2021   carvedilol 25 MG tablet Commonly known as: COREG Take 1 tablet (25 mg total) by mouth 2 (two) times daily with a meal.   chlorthalidone 25 MG tablet Commonly known as: HYGROTON Take 1 tablet (25 mg total) by mouth daily. Start taking on: March 08, 2021   cholecalciferol 25 MCG (1000 UNIT) tablet Commonly known as: VITAMIN D3 Take 1,000 Units by mouth daily.   clopidogrel 75 MG tablet Commonly known as: PLAVIX Take 1 tablet (75 mg total) by mouth daily for 17 days. Start taking on: March 08, 2021   empagliflozin 25 MG Tabs tablet Commonly known as:  JARDIANCE Take 1 tablet (25 mg total) by mouth daily. Start taking on: March 08, 2021 What changed:  medication strength how much to take   glucose monitoring kit monitoring kit 1 each by Does not apply route as needed for other.   insulin lispro 100 UNIT/ML KwikPen Commonly known as: HumaLOG KwikPen Inject 18 Units into the skin 3 (three) times daily.   Lantus SoloStar 100 UNIT/ML Solostar Pen Generic drug: insulin glargine Inject 20 Units into the skin 2 (two) times daily. What changed: how much to take   losartan 100 MG tablet Commonly known as: COZAAR Take 1 tablet (100 mg total) by mouth daily. Start taking on: March 08, 2021   metFORMIN 500 MG 24 hr tablet Commonly known as: GLUCOPHAGE-XR Take 1 tablet (500 mg total) by mouth 2 (two) times daily with a meal. What changed:  how much to take when to take this   rosuvastatin 40 MG tablet Commonly known as: CRESTOR Take 1 tablet (40 mg total) by mouth daily. Start taking on: March 08, 2021 What changed: how much to take   Unifine Pentips 31G X 5 MM Misc Generic drug: Insulin Pen Needle Place at tip of insulin pen and use as directed.   Victoza 18 MG/3ML Sopn Generic drug: liraglutide Inject 1.8 mg into the skin daily.   vitamin C 500 MG tablet Commonly known as: ASCORBIC ACID Take 500 mg by mouth daily.        Disposition  and follow-up:   [name] was discharged from [facility] in Stable condition.  At the hospital follow up visit please address:  Acute ischemic infarct of left thalamic pyramid: Patient will take aspirin plus Plavix until November 6, and then he will take aspirin alone from then on.  Patient instructed to schedule an appointment with the stroke clinic, will need follow-up in 1 month.  Chronic HFrEF: Ambulatory referral to cardiology was placed given his HFrEF and extensive hypertensive heart disease on echo in July 2022 (moderate LVH).  T2DM: He may  benefit from starting tirzepatide as an outpatient (in place of victoza). This will likely require a PA and is easier done in the outpatient setting.  HLD: Crestor was continued throughout his hospitalization and through discharge. Can consider addition of PCSK9 inhibitors if cholesterol remains high.   Thyroid nodule: Complex cystic mass in the midline superficial to the thyroid isthmus appears increased since imaging in 2021, therefore patient will need outpatient FNA and ENT referral for further evaluation.  Follow-up Appointments:  Follow-up Information     [name]. Schedule an appointment as soon as possible for a visit in 1 month(s).   Specialty: Neurology Why: stroke clinic Contact information: [address] [place] [facility] Course by problem list: Acute ischemic infarct of left thalamic pyramid Presented on 10/15 with dizziness since the Tuesday prior.  Otherwise, denied any focal neurological deficit, weakness, facial droop, dysphagia, vision changes.   In the ED, CTA showed multiple areas of severe stenosis such as the left external carotid artery or the right posterior cerebral artery. MRI of the brain initially was negative. However the neurologist and radiologist decided that there is a area of punctate focus in the left medullary pyramid which is consistent with an acute ischemia.  Neurology was consulted for their recommendations.  A1c 9.9.  LDL 271.  At that time, patient was placed on aspirin 81 mg and Plavix, with plan to continue for 21 days, followed by aspirin alone. Blood pressure medications were also held to allow for permissive hypertension and resumed when able (losartan on 10/15, chlorthalidone on 10/17, carvedilol resumed on 10/19). Echo on 10/16 showed EF of 50%, therefore ischemic eval was not pursued.  PT/OT evaluated the patient and recommended CIR. On 10/19, patient was  approved for CIR and was medically stable at discharge. Patient will follow-up in the stroke clinic in about 1 month.   Dyspnea on exertion Chronic HFrEF Also endorsed worsening shortness of breath exertion when he was admitted to the ED.  Exam at that time revealed right lower lung crackles and +1 edema BLE.  Echo findings as above. Chest x-ray did not show pulmonary edema. BNP 225. Diuretics were initially held to allow for permissive hypertension. The following day, on 10/15, the patient was euvolemic and remained so during the rest of his hospitalization. During his hospitalization, he was continued on losartan and Jardiance regimens.  Ambulatory referral to cardiology was placed given his HFrEF and extensive hypertensive heart disease on echo in July 2022 (moderate LVH).   Type 2 diabetes A1c of 9.9. Home regimen is Lantus 20 units twice daily with humalog 18 units 3 times daily.  On admit, he was placed on Levemir 15 units twice daily, NovoLog 5 units 3 times daily, and SSI. On 10/16, metformin 500 mg twice daily was started, however  patient refused medication due to feeling sick with taking it. Victoza was also unable to be administered in the hospital.  Nevertheless, his sugars remained well controlled on the aforementioned regimen while he was in the hospital. He may benefit from starting tirzepatide as an outpatient (in place of victoza). This will likely require a PA and is easier done in the outpatient setting.   Hyperlipidemia Was supposed to be on Crestor and Zetia the past, stopped due to side effects of one of the medications.  In the hospital, cholesterol 340, LDL 271, triglycerides 182.  Patient tolerated Crestor 40 mg daily while in the hospital however. Can consider addition of PCSK9 inhibitors if cholesterol remains high.    Hypertension Blood pressure medications were initially held to allow for permissive hypertension and resumed when able. Losartan 100 mg p.o. daily on 10/15,  chlorthalidone 25 mg p.o. daily on 10/17, carvedilol 25 mg p.o. twice daily resumed on 10/19.    Thyroid nodule TSH within normal limits. Complex cystic mass in the midline superficial to the thyroid isthmus appears increased since imaging in 2021, therefore patient will need outpatient FNA and ENT referral for further evaluation.  Discharge Exam:   BP 137/84 (BP Location: Left Arm)   Pulse 96   Temp 98 F (36.7 C) (Oral)   Resp 19   Ht '5\' 6"'  (1.676 m)   Wt 110.6 kg   SpO2 98%   BMI 39.35 kg/m  Discharge exam:  Constitutional: Appears well-developed and well-nourished. No distress.  HENT: Normocephalic and atraumatic, EOMI, PERRL, conjunctiva normal, moist mucous membranes Cardiovascular: Normal rate, regular rhythm, S1 and S2 present, no murmurs, rubs, gallops.  Distal pulses intact Respiratory: No respiratory distress, no accessory muscle use.  Effort is normal.  Lungs are clear to auscultation bilaterally. GI: Nondistended, soft, nontender to palpation, normal active bowel sounds Musculoskeletal: No peripheral edema, moving all extremities Skin: Warm and dry. No rash, erythema, lesions noted. Neuro: AAOx3, no new focal deficits appreciated.  Psychiatric: Normal mood and affect.  Pertinent Labs, Studies, and Procedures:  CBC Latest Ref Rng & Units 03/05/2021 03/02/2021 03/01/2021  WBC 4.0 - 10.5 K/uL 9.7 10.1 8.4  Hemoglobin 13.0 - 17.0 g/dL 13.6 14.4 13.9  Hematocrit 39.0 - 52.0 % 42.2 44.3 43.9  Platelets 150 - 400 K/uL 210 222 203     BMP Latest Ref Rng & Units 03/07/2021 03/06/2021 03/05/2021  Glucose 70 - 99 mg/dL 151(H) 189(H) 147(H)  BUN 8 - 23 mg/dL '23 20 21  ' Creatinine 0.61 - 1.24 mg/dL 1.18 1.08 1.32(H)  BUN/Creat Ratio 10 - 24 - - -  Sodium 135 - 145 mmol/L 136 134(L) 136  Potassium 3.5 - 5.1 mmol/L 3.8 3.2(L) 3.6  Chloride 98 - 111 mmol/L 102 101 101  CO2 22 - 32 mmol/L '22 26 28  ' Calcium 8.9 - 10.3 mg/dL 9.2 9.0 8.9    TSH 1.33 BNP 225.9  Urinalysis     Component Value Date/Time   COLORURINE YELLOW 03/01/2021 1642   APPEARANCEUR CLEAR 03/01/2021 1642   LABSPEC 1.025 03/01/2021 1642   PHURINE 5.0 03/01/2021 1642   GLUCOSEU >=500 (A) 03/01/2021 1642   HGBUR NEGATIVE 03/01/2021 1642   BILIRUBINUR NEGATIVE 03/01/2021 1642   KETONESUR NEGATIVE 03/01/2021 1642   PROTEINUR 100 (A) 03/01/2021 1642   UROBILINOGEN 0.2 08/24/2013 1328   NITRITE NEGATIVE 03/01/2021 1642   LEUKOCYTESUR NEGATIVE 03/01/2021 1642    Lipid Panel     Component Value Date/Time   CHOL 340 (H) 03/03/2021  0500   CHOL 300 (H) 04/26/2020 1014   TRIG 182 (H) 03/03/2021 0500   HDL 33 (L) 03/03/2021 0500   HDL 29 (L) 04/26/2020 1014   CHOLHDL 10.3 03/03/2021 0500   VLDL 36 03/03/2021 0500   LDLCALC 271 (H) 03/03/2021 0500   LDLCALC 220 (H) 04/26/2020 1014   LABVLDL 51 (H) 04/26/2020 1014    Prothrombin Time 11.4 - 15.2 seconds 13.3    INR 0.8 - 1.2 1.0      Discharge Instructions: Discharge Instructions     Ambulatory referral to Cardiology   Complete by: As directed    Ambulatory referral to Neurology   Complete by: As directed    Follow up with stroke clinic NP (Jessica Vanschaick or Cecille Rubin, if both not available, consider Zachery Dauer, or Ahern) at [facility] in about 4 weeks. Thanks.   Call MD for:  difficulty breathing, headache or visual disturbances   Complete by: As directed    Call MD for:  persistant dizziness or light-headedness   Complete by: As directed        Signed: Orvis Brill, MD 03/07/2021, 11:09 AM   Pager: [phone]

## 2021-03-07 NOTE — Progress Notes (Signed)
Patient ID: Thomas Gardner, male   DOB: May 06, 1956, 65 y.o.   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 Reviewed medical dx and secondary risks including HTN, HLD (LDL 121) and DM (A1c 9.9). Patient noted had been higher and he is symptomatic with CBG < 200. Had not been taking metformin and cholesterol meds PTA. Discussed DAPT x3 weeks then ASA solo per MD. Reviewed medications and dietary modifications recommended. Continue to follow along to discharge to address educational needs and concerns for discharge care. Collaborate with the team to facilitate preparation for discharge. Margarito Liner, RN

## 2021-03-07 NOTE — Progress Notes (Signed)
Inpatient Rehabilitation Admission Medication Review by a Pharmacist  A complete drug regimen review was completed for this patient to identify any potential clinically significant medication issues.  High Risk Drug Classes Is patient taking? Indication by Medication  Antipsychotic Yes Prn nausea  Anticoagulant Yes VTE prophylaxis  Antibiotic No   Opioid No   Antiplatelet Yes CVA  Hypoglycemics/insulin Yes Hyperglycemia/DM  Vasoactive Medication Yes Hypertension, HFrEF  Chemotherapy No   Other No      Type of Medication Issue Identified Description of Issue Recommendation(s)  Drug Interaction(s) (clinically significant)     Duplicate Therapy     Allergy     No Medication Administration End Date     Incorrect Dose     Additional Drug Therapy Needed     Significant med changes from prior encounter (inform family/care partners about these prior to discharge).    Other       Clinically significant medication issues were identified that warrant physician communication and completion of prescribed/recommended actions by midnight of the next day:  No  Time spent performing this drug regimen review (minutes):  20   Thank you for allowing [place] to participate in this patients care. Jens Som, PharmD 03/07/2021 2:10 PM  **Pharmacist phone directory can be found on [email or web address] listed under [place]**

## 2021-03-07 NOTE — H&P (Signed)
p   Physical Medicine and Rehabilitation Admission H&P    Chief Complaint  Patient presents with   Stroke with functional deficits.     HPI:  Thomas Gardner. [name] is a 65 year old Rh-male with  history of T2DM, HFrEF, HTN, OSA, headaches, morbid obesity, recent wisdom tooth extraction who was admitted on 03/02/2021 with reports of persistent HA, dizziness, and difficulty walking with tendency to fall to the left.  History taken from patient and chart review.  UDS positive for opiates. CTA head done revealing multifocal moderate/severe stenosis of right PCA, severe stenosis proximal L-ECA, incidental 3.6 cm right thyroid nodule and was negative for LVO.  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  Echocardiogram with ejection fraction of 50%, trivial MVR and TVR, no wall abnormality and low normal LVF. 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 recommended DAPT X 3 weeks followed by ASA alone as well control of risk factors. He has been followed by teaching service and thyroid ultrasound done showing slight interval increase in complex cystic mass in midline of neck with recommendations for ENT referral and stable right thyroid nodule.    Hospital course has been complicated by labile blood pressures and home meds slowly resumed. Patient with resultant dizziness, tremors with Left knee instability, weakness left hand?, shuffling/scissoring gait and balance deficits affecting mobility and ADLs. CIR recommended due to functional decline.  Please see preadmission assessment from earlier today as well.   Review of Systems  Constitutional:  Negative for chills and fever.  HENT:  Negative for hearing loss and tinnitus.   Eyes:  Positive for blurred vision. Negative for pain.  Respiratory:  Positive for shortness of breath (not as bad--DOE). Negative for cough.   Cardiovascular:  Negative for chest pain and  palpitations.  Gastrointestinal:  Positive for constipation. Negative for heartburn and nausea.  Genitourinary:  Negative for dysuria and urgency.  Musculoskeletal:  Positive for back pain and joint pain (knee pain --bilateral mensical tears.). Negative for myalgias and neck pain.  Skin:  Negative for rash.  Neurological:  Positive for dizziness and weakness. Negative for headaches.  Psychiatric/Behavioral:  Negative for memory loss.   All other systems reviewed and are negative.   Past Medical History:  Diagnosis Date   Arthritis    Asthma    CHF (congestive heart failure) ([place])    Diabetes mellitus 2003   GERD (gastroesophageal reflux disease)    Hyperlipidemia    Hypertension    Morbid obesity ([place])    Sleep apnea     Past Surgical History:  Procedure Laterality Date   BREATH TEK H PYLORI  11/11/2011   Procedure: BREATH TEK H PYLORI;  Surgeon: Shann Medal, MD;  Location: [facility];  Service: General;  Laterality: N/A;   CARDIAC CATHETERIZATION     ESOPHAGOGASTRODUODENOSCOPY (EGD) WITH PROPOFOL N/A 05/04/2014   Procedure: ESOPHAGOGASTRODUODENOSCOPY (EGD) WITH PROPOFOL;  Surgeon: Cleotis Nipper, MD;  Location: [facility] ENDOSCOPY;  Service: Endoscopy;  Laterality: N/A;    Family History  Problem Relation Age of Onset   Cancer Mother        ovarian   Diabetes Mother    Stroke Father    Cancer Maternal Aunt        breast   Diabetes Maternal Aunt    Cancer Maternal Uncle        colon    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 He reports that he has never smoked. He has never used smokeless tobacco. He reports that he does not drink alcohol and does not use drugs.   Allergies  Allergen Reactions   Erythromycin Nausea And Vomiting    Medications Prior to Admission  Medication Sig Dispense Refill   acetaminophen (TYLENOL) 650 MG CR tablet Take 325 mg by mouth every 8 (eight) hours as needed for pain.     albuterol (PROVENTIL HFA;VENTOLIN HFA) 108 (90  Base) MCG/ACT inhaler Inhale 2 puffs into the lungs every 6 (six) hours as needed for wheezing or shortness of breath. MAP pharmacy 3 Inhaler 1   carvedilol (COREG) 25 MG tablet Take 1 tablet (25 mg total) by mouth 2 (two) times daily with a meal. 180 tablet 1   chlorthalidone (HYGROTON) 25 MG tablet TAKE 1 TABLET (25 MG TOTAL) BY MOUTH DAILY. (Patient taking differently: Take 25 mg by mouth daily.) 90 tablet 1   cholecalciferol (VITAMIN D3) 25 MCG (1000 UNIT) tablet Take 1,000 Units by mouth daily.     empagliflozin (JARDIANCE) 10 MG TABS tablet Take 1 tablet (10 mg total) by mouth daily. 30 tablet 2   insulin glargine (LANTUS SOLOSTAR) 100 UNIT/ML Solostar Pen Inject 20 Units into the skin 2 (two) times daily. (Patient taking differently: Inject 25 Units into the skin 2 (two) times daily.) 12 mL 2   insulin lispro (HUMALOG KWIKPEN) 100 UNIT/ML KwikPen Inject 18 Units into the skin 3 (three) times daily. 15 mL 0   liraglutide (VICTOZA) 18 MG/3ML SOPN Inject 1.8 mg into the skin daily. 9 mL 2   losartan (COZAAR) 100 MG tablet TAKE 1 TABLET (100 MG TOTAL) BY MOUTH DAILY. (Patient taking differently: Take 100 mg by mouth daily.) 90 tablet 3   vitamin C (ASCORBIC ACID) 500 MG tablet Take 500 mg by mouth daily.     amoxicillin (AMOXIL) 500 MG capsule Take 1 capsule (500 mg total) by mouth 4 (four) times daily. (Patient not taking: No sig reported) 28 capsule 0   doxycycline (VIBRAMYCIN) 100 MG capsule TAKE 1 CAPSULE (100 MG TOTAL) BY MOUTH 2 (TWO) TIMES DAILY FOR 7 DAYS. (Patient not taking: No sig reported) 14 capsule 0   glucose monitoring kit (FREESTYLE) monitoring kit 1 each by Does not apply route as needed for other. 1 each 0   HYDROcodone-acetaminophen (NORCO/VICODIN) 5-325 MG tablet Take 1 tablet by mouth 4 (four) times daily. (Patient not taking: Reported on 03/03/2021) 12 tablet 0   Insulin Pen Needle (UNIFINE PENTIPS) 31G X 5 MM MISC Place at tip of insulin pen and use as directed. 100 each 1    metFORMIN (GLUCOPHAGE-XR) 500 MG 24 hr tablet TAKE 1 TABLET (500 MG TOTAL) BY MOUTH DAILY WITH BREAKFAST. (Patient not taking: No sig reported) 30 tablet 3   PROVENTIL HFA 108 (90 Base) MCG/ACT inhaler INHALE 2 PUFFS INTO THE LUNGS EVERY 6 HOURS AS NEEDED FOR WHEEZING ORSHORTNESS OF BREATH. (Patient not taking: Reported on 03/03/2021) 6.7 g 12   rosuvastatin (CRESTOR) 40 MG tablet TAKE 1 TABLET (40 MG TOTAL) BY MOUTH DAILY. (Patient not taking: No sig reported) 30 tablet 3   ZETIA 10 MG tablet TAKE 1 TABLET (10MG TOTAL) BY MOUTH DAILY. (Patient not taking: No sig reported) 90 tablet 2    Drug Regimen Review  Drug regimen was reviewed and remains appropriate with no significant issues identified  Home: Home Living Family/patient expects to be discharged to:: Private residence Living Arrangements: Alone Available Help at  Discharge: Friend(s), Available PRN/intermittently Type of Home: House Home Access: Stairs to enter [employer] of Steps: 2 Entrance Stairs-Rails: None Home Layout: One level Bathroom Shower/Tub: Chiropodist: Handicapped height Home Equipment: Grab bars - tub/shower, Grab bars - toilet Additional Comments: pt reports he is moving into his parents (they're deceased) house and can't go back to his apartment because there are too many boxes. 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)  Functional Status:  Mobility: Bed Mobility Overal bed mobility: Needs Assistance Bed Mobility: Supine to Sit Supine to sit: Supervision General bed mobility comments: pt received EOB upon PT entry Transfers Overall transfer level: Needs assistance Equipment used: Rolling walker (2 wheeled) Transfers: Sit to/from Stand Sit to Stand: Min assist General transfer comment: minA to  steady due to onset of tremors/shakiness and knee instability/buckling Ambulation/Gait Ambulation/Gait assistance: Min Web designer (Feet): 60 Feet Assistive device: Rolling walker (2 wheeled) Gait Pattern/deviations: Step-to pattern, Step-through pattern, Decreased stride length, Narrow base of support General Gait Details: pt with varied gait. Pt initially with very narrow, near scissoring, base of support and short shuffled steps. With verbal cues to increase distance between feet and step length pt improved but couldn't maintain more than 5 prior to reverting back to shuffled narrow gait pattern, pt with 2 episodes of L knee buckling requiring modA to preven fall. Pt remains dependent on UEs as well Gait velocity: very slow and guarded Gait velocity interpretation: <1.8 ft/sec, indicate of risk for recurrent falls    ADL: ADL Overall ADL's : Needs assistance/impaired Grooming: Minimal assistance, Standing, Wash/dry hands Grooming Details (indicate cue type and reason): min assist without UE support, leaning on sink with min guard and relies on external support Upper Body Dressing : Minimal assistance, Sitting Lower Body Dressing: Minimal assistance, Sit to/from stand Lower Body Dressing Details (indicate cue type and reason): able to don/doff socks, min assist in standing Toilet Transfer: Ambulation, RW, Min guard Toileting- Clothing Manipulation and Hygiene: Minimal assistance, Sit to/from stand Functional mobility during ADLs: Minimal assistance, Rolling walker General ADL Comments: cueing during ADls for RW safety, tremors noted and shuffled gait requiring cueing for increased step length  Cognition: Cognition Overall Cognitive Status: Within Functional Limits for tasks assessed Arousal/Alertness: Awake/alert Orientation Level: Oriented X4, Oriented to place, Oriented to person, Oriented to time, Oriented to situation Attention: Selective Selective Attention: Appears  intact Memory: Appears intact Awareness: Appears intact Problem Solving: Appears intact Cognition Arousal/Alertness: Awake/alert Behavior During Therapy: WFL for tasks assessed/performed Overall Cognitive Status: Within Functional Limits for tasks assessed General Comments: motivated to get better, anxious regarding home situation and d/c plan   Blood pressure 137/84, pulse 96, temperature 98 F (36.7 C), temperature source Oral, resp. rate 19, height _0  (1.676 m), weight 110.6 kg, SpO2 98 %. Physical Exam Vitals and nursing note reviewed.  Constitutional:      Appearance: Normal appearance. He is obese.  HENT:     Head: Normocephalic and atraumatic.     Right Ear: External ear normal.     Left Ear: External ear normal.     Nose: Nose normal.  Eyes:     General:        Right eye: No discharge.        Left eye: No discharge.     Extraocular Movements: Extraocular movements  intact.  Cardiovascular:     Rate and Rhythm: Normal rate and regular rhythm.  Pulmonary:     Effort: Pulmonary effort is normal. No respiratory distress.     Breath sounds: Normal breath sounds.  Abdominal:     General: Abdomen is flat. Bowel sounds are normal. There is no distension.  Musculoskeletal:     Cervical back: Normal range of motion and neck supple.     Comments: No edema or tenderness in extremities  Skin:    General: Skin is warm and dry.  Neurological:     Mental Status: He is alert and oriented to person, place, and time.     Comments: Alert Motor: 4-/5 throughout  Psychiatric:        Mood and Affect: Affect is flat.        Speech: Speech normal.    Results for orders placed or performed during the hospital encounter of 03/02/21 (from the past 48 hour(s))  Glucose, capillary     Status: Abnormal   Collection Time: 03/05/21 11:24 AM  Result Value Ref Range   Glucose-Capillary 274 (H) 70 - 99 mg/dL    Comment: Glucose reference range applies only to samples taken after fasting  for at least 8 hours.  Glucose, capillary     Status: Abnormal   Collection Time: 03/05/21 12:52 PM  Result Value Ref Range   Glucose-Capillary 186 (H) 70 - 99 mg/dL    Comment: Glucose reference range applies only to samples taken after fasting for at least 8 hours.  Glucose, capillary     Status: Abnormal   Collection Time: 03/05/21  4:55 PM  Result Value Ref Range   Glucose-Capillary 151 (H) 70 - 99 mg/dL    Comment: Glucose reference range applies only to samples taken after fasting for at least 8 hours.  Glucose, capillary     Status: Abnormal   Collection Time: 03/05/21 10:09 PM  Result Value Ref Range   Glucose-Capillary 186 (H) 70 - 99 mg/dL    Comment: Glucose reference range applies only to samples taken after fasting for at least 8 hours.  Basic metabolic panel     Status: Abnormal   Collection Time: 03/06/21  2:53 AM  Result Value Ref Range   Sodium 134 (L) 135 - 145 mmol/L   Potassium 3.2 (L) 3.5 - 5.1 mmol/L   Chloride 101 98 - 111 mmol/L   CO2 26 22 - 32 mmol/L   Glucose, Bld 189 (H) 70 - 99 mg/dL    Comment: Glucose reference range applies only to samples taken after fasting for at least 8 hours.   BUN 20 8 - 23 mg/dL   Creatinine, Ser 1.08 0.61 - 1.24 mg/dL   Calcium 9.0 8.9 - 10.3 mg/dL   GFR, Estimated >60 >60 mL/min    Comment: (NOTE) Calculated using the CKD-EPI Creatinine Equation (2021)    Anion gap 7 5 - 15    Comment: Performed at [facility] [address] [place], [place] [address]  Glucose, capillary     Status: Abnormal   Collection Time: 03/06/21  7:25 AM  Result Value Ref Range   Glucose-Capillary 178 (H) 70 - 99 mg/dL    Comment: Glucose reference range applies only to samples taken after fasting for at least 8 hours.  Glucose, capillary     Status: Abnormal   Collection Time: 03/06/21 11:22 AM  Result Value Ref Range   Glucose-Capillary 272 (H) 70 - 99 mg/dL  Comment: Glucose reference range applies only to samples taken after  fasting for at least 8 hours.  Glucose, capillary     Status: Abnormal   Collection Time: 03/06/21  1:18 PM  Result Value Ref Range   Glucose-Capillary 189 (H) 70 - 99 mg/dL    Comment: Glucose reference range applies only to samples taken after fasting for at least 8 hours.  Glucose, capillary     Status: Abnormal   Collection Time: 03/06/21  4:36 PM  Result Value Ref Range   Glucose-Capillary 312 (H) 70 - 99 mg/dL    Comment: Glucose reference range applies only to samples taken after fasting for at least 8 hours.  Glucose, capillary     Status: Abnormal   Collection Time: 03/06/21  8:04 PM  Result Value Ref Range   Glucose-Capillary 240 (H) 70 - 99 mg/dL    Comment: Glucose reference range applies only to samples taken after fasting for at least 8 hours.  Basic metabolic panel     Status: Abnormal   Collection Time: 03/07/21  2:52 AM  Result Value Ref Range   Sodium 136 135 - 145 mmol/L   Potassium 3.8 3.5 - 5.1 mmol/L   Chloride 102 98 - 111 mmol/L   CO2 22 22 - 32 mmol/L   Glucose, Bld 151 (H) 70 - 99 mg/dL    Comment: Glucose reference range applies only to samples taken after fasting for at least 8 hours.   BUN 23 8 - 23 mg/dL   Creatinine, Ser 1.18 0.61 - 1.24 mg/dL   Calcium 9.2 8.9 - 10.3 mg/dL   GFR, Estimated >60 >60 mL/min    Comment: (NOTE) Calculated using the CKD-EPI Creatinine Equation (2021)    Anion gap 12 5 - 15    Comment: Performed at [facility] [address] [place], [place] [address]  Glucose, capillary     Status: Abnormal   Collection Time: 03/07/21  8:00 AM  Result Value Ref Range   Glucose-Capillary 181 (H) 70 - 99 mg/dL    Comment: Glucose reference range applies only to samples taken after fasting for at least 8 hours.   No results found.     Medical Problem List and Plan: 1. Dizziness, tremors, left knee instability, weakness left hand?, shuffling/scissoring gait, and balance deficits secondary to ischemia in left medullary  pyramid.  -patient may shower  -ELOS/Goals: 7-10 days/supervision/mod I  Admit to CIR 2.  Antithrombotics: -DVT/anticoagulation:  Pharmaceutical: Lovenox  -antiplatelet therapy: DAPT X 3 weeks followed by ASA alone.  3. Chronic HA/Pain Management: Use tylenol prn.  4. Mood: LCSW to follow for evaluation and support.   -antipsychotic agents: N/a 5. Neuropsych: This patient is capable of making decisions on his own behalf. 6. Skin/Wound Care: Routine pressure relief measures.  7. Fluids/Electrolytes/Nutrition: Monitor I/Os  --CMP ordered for tomorrow 8. Chronic HFrEF/HTN:  On Losartan, Coreg, hygroton, Crestor and Jardiance.  -- Daily weights for signs of overload.   --has seen Dr. Einar Gip in the past.  9.T2DM: Hgb A1C- 9.9 and poorly controlled as he feels weak if BS <200.   --Continue Jardiance, d/c metformin (he has been refusing and does not plan on taking it at home), insulin glargine  as at home and novolog tid AC  --Monitor with increased activity 10.  Dyslipidemia: LDL-121. Was non-compliant with meds. Continue to educate on compliance.   --On Crestor and Zetia.  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 13. HTN: Now back on home  regimen of Cozaar, hygroton, Coreg. --will order orthostatic vitals.   --Monitor with increased mobility 14. Hypokalemia: Will add low dose supplement for recurrent hypokalemia (hygroton on board).   CMP ordered for tomorrow. 15. OSA: Machine broke/lack of insurance--will need sleep study after discharge.     Bary Leriche, PA-C 03/07/2021  I have personally performed a face to face diagnostic evaluation, including, but not limited to relevant history and physical exam findings, of this patient and developed relevant assessment and plan.  Additionally, I have reviewed and concur with the physician assistant's documentation above.  Delice Lesch, MD, ABPMR

## 2021-03-07 NOTE — Plan of Care (Signed)
  Problem: Education: Goal: Knowledge of General Education information will improve Description Including pain rating scale, medication(s)/side effects and non-pharmacologic comfort measures Outcome: Adequate for Discharge   Problem: Health Behavior/Discharge Planning: Goal: Ability to manage health-related needs will improve Outcome: Adequate for Discharge   Problem: Clinical Measurements: Goal: Ability to maintain clinical measurements within normal limits will improve Outcome: Adequate for Discharge Goal: Will remain free from infection Outcome: Adequate for Discharge Goal: Diagnostic test results will improve Outcome: Adequate for Discharge Goal: Respiratory complications will improve Outcome: Adequate for Discharge Goal: Cardiovascular complication will be avoided Outcome: Adequate for Discharge   Problem: Activity: Goal: Risk for activity intolerance will decrease Outcome: Adequate for Discharge   Problem: Nutrition: Goal: Adequate nutrition will be maintained Outcome: Adequate for Discharge   Problem: Coping: Goal: Level of anxiety will decrease Outcome: Adequate for Discharge   Problem: Elimination: Goal: Will not experience complications related to bowel motility Outcome: Adequate for Discharge Goal: Will not experience complications related to urinary retention Outcome: Adequate for Discharge   Problem: Pain Managment: Goal: General experience of comfort will improve Outcome: Adequate for Discharge   Problem: Safety: Goal: Ability to remain free from injury will improve Outcome: Adequate for Discharge   Problem: Skin Integrity: Goal: Risk for impaired skin integrity will decrease Outcome: Adequate for Discharge   Problem: Education: Goal: Knowledge of disease or condition will improve Outcome: Adequate for Discharge Goal: Knowledge of secondary prevention will improve Outcome: Adequate for Discharge   

## 2021-03-07 NOTE — Progress Notes (Signed)
Inpatient Rehab Admissions Coordinator:  ° °I have a CIR bed for this Pt. Today. RN may call report to [phone]. ° °Marguerita Stapp, MS, CCC-SLP °Rehab Admissions Coordinator  °[phone] (celll) °[phone] (office) ° °

## 2021-03-08 DIAGNOSIS — I639 Cerebral infarction, unspecified: Secondary | ICD-10-CM | POA: Diagnosis not present

## 2021-03-08 LAB — CBC WITH DIFFERENTIAL/PLATELET
Abs Immature Granulocytes: 0.04 10*3/uL (ref 0.00–0.07)
Basophils Absolute: 0.1 10*3/uL (ref 0.0–0.1)
Basophils Relative: 1 %
Eosinophils Absolute: 0.3 10*3/uL (ref 0.0–0.5)
Eosinophils Relative: 3 %
HCT: 42.1 % (ref 39.0–52.0)
Hemoglobin: 13.7 g/dL (ref 13.0–17.0)
Immature Granulocytes: 1 %
Lymphocytes Relative: 36 %
Lymphs Abs: 3 10*3/uL (ref 0.7–4.0)
MCH: 26.3 pg (ref 26.0–34.0)
MCHC: 32.5 g/dL (ref 30.0–36.0)
MCV: 81 fL (ref 80.0–100.0)
Monocytes Absolute: 0.8 10*3/uL (ref 0.1–1.0)
Monocytes Relative: 9 %
Neutro Abs: 4.3 10*3/uL (ref 1.7–7.7)
Neutrophils Relative %: 50 %
Platelets: 217 10*3/uL (ref 150–400)
RBC: 5.2 MIL/uL (ref 4.22–5.81)
RDW: 14.5 % (ref 11.5–15.5)
WBC: 8.5 10*3/uL (ref 4.0–10.5)
nRBC: 0 % (ref 0.0–0.2)

## 2021-03-08 LAB — COMPREHENSIVE METABOLIC PANEL WITH GFR
ALT: 36 U/L (ref 0–44)
AST: 29 U/L (ref 15–41)
Albumin: 3.6 g/dL (ref 3.5–5.0)
Alkaline Phosphatase: 60 U/L (ref 38–126)
Anion gap: 10 (ref 5–15)
BUN: 29 mg/dL — ABNORMAL HIGH (ref 8–23)
CO2: 26 mmol/L (ref 22–32)
Calcium: 9.1 mg/dL (ref 8.9–10.3)
Chloride: 101 mmol/L (ref 98–111)
Creatinine, Ser: 1.38 mg/dL — ABNORMAL HIGH (ref 0.61–1.24)
GFR, Estimated: 57 mL/min — ABNORMAL LOW
Glucose, Bld: 179 mg/dL — ABNORMAL HIGH (ref 70–99)
Potassium: 3.7 mmol/L (ref 3.5–5.1)
Sodium: 137 mmol/L (ref 135–145)
Total Bilirubin: 0.6 mg/dL (ref 0.3–1.2)
Total Protein: 7.1 g/dL (ref 6.5–8.1)

## 2021-03-08 LAB — GLUCOSE, CAPILLARY
Glucose-Capillary: 212 mg/dL — ABNORMAL HIGH (ref 70–99)
Glucose-Capillary: 215 mg/dL — ABNORMAL HIGH (ref 70–99)
Glucose-Capillary: 156 mg/dL — ABNORMAL HIGH (ref 70–99)
Glucose-Capillary: 161 mg/dL — ABNORMAL HIGH (ref 70–99)

## 2021-03-08 MED ORDER — LOSARTAN POTASSIUM 25 MG PO TABS
25.0000 mg | ORAL_TABLET | Freq: Every day | ORAL | Status: DC
  Administered 2021-03-09 – 2021-03-16 (×8): 25 mg via ORAL
  Filled 2021-03-08 (×8): qty 1

## 2021-03-08 MED ORDER — CHLORTHALIDONE 25 MG PO TABS
12.5000 mg | ORAL_TABLET | Freq: Every day | ORAL | Status: DC
  Administered 2021-03-08 – 2021-03-13 (×6): 12.5 mg via ORAL
  Filled 2021-03-08 (×5): qty 0.5

## 2021-03-08 NOTE — Progress Notes (Signed)
Physical Therapy Session Note  Patient Details  Name: Thomas Gardner MRN: [id number] Date of Birth: 1956-02-09  Today's Date: 03/08/2021 PT Individual Time: 1535-1600 and 0626-9485 PT Individual Time Calculation (min): 25 min and 62mn   Short Term Goals: Week 1:  PT Short Term Goal 1 (Week 1): Pt will Propell WC 1548fwith supevisoin assist PT Short Term Goal 2 (Week 1): [facility] PT Short Term Goal 3 (Week 1): Pt will ambulate 10082fith min A. PT Short Term Goal 4 (Week 1): Pt will ascend 1 step with RW and min assist  Skilled Therapeutic Interventions/Progress Updates:  Session 2  Pt received sitting in WC and agreeable to PT  Car transfer with RW and min-mod assist for Ad managmeent, weight shift and cues for sit>pivot technique. Gait training with RW and min assist x [id number] to increasing apraxia on the L and increase tremor on the R. Patient returned to room and performed stand pivot to recliner with min A* and RW. Pt left sitting in recliner with call bell in reach and all needs met.    Session 3.   Pt received sitting in WC and agreeable to PT. Nustep reciprocal movement training with BUE/BLE x 6 min with cues for increased steps per min, and symmetry of movement. .   Transfer [facility] and cues for AD management and cues for posture, and step length on the R.   Gait training with RW x 50ft72fh min assist for safety. Improved position of the RW with turns this session as well decreased tremors. Mild L lateral lean with fatigue.   Patient returned to room and performed stand pivot to recliner with ambulatory [facility] assist for safery and UE Support on the RW. Pt left sitting in recliner with call bell in reach and all needs met.        Therapy Documentation Precautions:  Precautions Precautions: Fall Precaution Comments: tremors/shakiness, mild L hemi Restrictions Weight Bearing  Restrictions: No  Vital Signs: Therapy Vitals Temp: 98.5 F (36.9 C) Temp Source: Oral Pulse Rate: 78 Resp: 16 BP: 128/62 Patient Position (if appropriate): Sitting Oxygen Therapy SpO2: 96 % O2 Device: Room Air Pain: Session 2  Pain Assessment Pain Scale: Faces Pain Score: 5  Faces Pain Scale: Hurts a little bit Pain Type: Acute pain Pain Location: Head Pain Descriptors / Indicators: Headache Pain Onset: Unable to tell Patients Stated Pain Goal: 4 Pain Intervention(s): Medication (See eMAR) Session 3.  Denies pain     Therapy/Group: Individual Therapy  AustiLorie Phenix0/2022, 4:06 PM

## 2021-03-08 NOTE — Plan of Care (Signed)
Nutrition Education Note  RD consulted for nutrition diet education.   Lipid Panel     Component Value Date/Time   CHOL 340 (H) 03/03/2021 0500   CHOL 300 (H) 04/26/2020 1014   TRIG 182 (H) 03/03/2021 0500   HDL 33 (L) 03/03/2021 0500   HDL 29 (L) 04/26/2020 1014   CHOLHDL 10.3 03/03/2021 0500   VLDL 36 03/03/2021 0500   LDLCALC 271 (H) 03/03/2021 0500   LDLCALC 220 (H) 04/26/2020 1014    RD provided "Heart Healthy Consistent Carbohydrate Nutrition Therapy" handout from the Academy of Nutrition and Dietetics. Reviewed patient's dietary recall. Pt reports consuming fast food/restaurants foods very frequently prior to admission. Provided examples on ways to decrease sodium and fat intake in diet. Discouraged intake of processed foods and use of salt shaker. Encouraged fresh fruits and vegetables as well as whole grain sources of carbohydrates to maximize fiber intake. Discussed diabetic friendly drink options. Teach back method used.  Expect good compliance.  Current diet order is carbohydrate modified, patient is consuming approximately 100% of meals at this time. Labs and medications reviewed. No further nutrition interventions warranted at this time. RD contact information provided. If additional nutrition issues arise, please re-consult RD.  Corrin Parker, MS, RD, LDN RD pager number/after hours weekend pager number on Amion.

## 2021-03-08 NOTE — Progress Notes (Signed)
Inpatient Rehabilitation Care Coordinator Assessment and Plan Patient Details  Name: Thomas Gardner MRN: [id number] Date of Birth: [birth year]  Today's Date: 03/08/2021  Hospital Problems: Principal Problem:   Acute stroke of medulla oblongata [facility])  Past Medical History:  Past Medical History:  Diagnosis Date   Arthritis    Asthma    CHF (congestive heart failure) ([place])    Diabetes mellitus 2003   GERD (gastroesophageal reflux disease)    Hyperlipidemia    Hypertension    Morbid obesity ([place])    Sleep apnea    Past Surgical History:  Past Surgical History:  Procedure Laterality Date   BREATH TEK H PYLORI  11/11/2011   Procedure: BREATH TEK H PYLORI;  Surgeon: Shann Medal, MD;  Location: [facility];  Service: General;  Laterality: N/A;   CARDIAC CATHETERIZATION     ESOPHAGOGASTRODUODENOSCOPY (EGD) WITH PROPOFOL N/A 05/04/2014   Procedure: ESOPHAGOGASTRODUODENOSCOPY (EGD) WITH PROPOFOL;  Surgeon: Cleotis Nipper, MD;  Location: [facility] ENDOSCOPY;  Service: Endoscopy;  Laterality: N/A;   Social History:  reports that he has never smoked. He has never used smokeless tobacco. He reports that he does not drink alcohol and does not use drugs.  Family / Support Systems Other Supports: Thomas Gardner (sister) [phone] Anticipated Caregiver: Cordice (Friend) Ability/Limitations of Caregiver: MinA Caregiver Availability: Intermittent Family Dynamics: support from primaryly friends. Sister also able to assist  Social History Preferred language: English Religion: [place] - How often do you need to have someone help you when you read instructions, pamphlets, or other written material from your doctor or pharmacy?: Never Writes: Yes Legal History/Current Legal Issues: n/a Guardian/Conservator: n/a   Abuse/Neglect Abuse/Neglect Assessment Can Be Completed: Yes Physical Abuse: Denies Verbal Abuse: Denies Sexual Abuse: Denies Exploitation of  patient/patient's resources: Denies Self-Neglect: Denies  Patient response to: Social Isolation - How often do you feel lonely or isolated from those around you?: Never  Emotional Status Recent Psychosocial Issues: n/a Psychiatric History: n/a Substance Abuse History: n/a  Patient / Family Perceptions, Expectations & Goals Pt/Family understanding of illness & functional limitations: yes. Patient has updated friends and sister Premorbid pt/family roles/activities: Patient previously independent overall Anticipated changes in roles/activities/participation: pt anticipated to reach [facility] Pt/family expectations/goals: Colon: None Premorbid Home Care/DME Agencies: Other (Comment) (Gr[employer]) Transportation available at discharge: sister/friends able to transport Is the patient able to respond to transportation needs?: Yes In the past 12 months, has lack of transportation kept you from medical appointments or from getting medications?: No In the past 12 months, has lack of transportation kept you from meetings, work, or from getting things needed for daily living?: No  Discharge Planning Living Arrangements: Alone Support Systems: Other relatives, Friends/neighbors Type of Residence: Private residence Insurance Resources: Multimedia programmer (specify) ([employer] Medicare) Financial Resources: Family Support Financial Screen Referred: No Living Expenses: Lives with family Money Management: Patient Does the patient have any problems obtaining your medications?: No Home Management: Independent Patient/Family Preliminary Plans: Patient able to manage Care Coordinator Barriers to Discharge: Insurance for SNF coverage, Lack of/limited family support, Decreased caregiver support, Home environment access/layout Care Coordinator Anticipated Follow Up Needs: [facility] Additional Notes/Comments: Patient moving into parents old home, not returning to  apartment due to boxes. Pt d/c to friends home Expected length of stay: 5-7 Days  Clinical Impression SW met with patient, introduced self, explained role and addressed questions and concerns. Pt plans to d/c home with friends, in the process of  moving. N o additional questions or concerns, sw will cont to follow up.  Dyanne Iha 03/08/2021, 1:53 PM

## 2021-03-08 NOTE — Progress Notes (Signed)
PROGRESS NOTE   Subjective/Complaints:  No issues overnite , discussed labwork and BP  ROS- neg CP, SOB, N/V/D  Objective:   No results found. No results for input(s): WBC, HGB, HCT, PLT in the last 72 hours. Recent Labs    03/07/21 0252 03/08/21 0613  NA 136 137  K 3.8 3.7  CL 102 101  CO2 22 26  GLUCOSE 151* 179*  BUN 23 29*  CREATININE 1.18 1.38*  CALCIUM 9.2 9.1    Intake/Output Summary (Last 24 hours) at 03/08/2021 0727 Last data filed at 03/07/2021 2207 Gross per 24 hour  Intake 480 ml  Output --  Net 480 ml        Physical Exam: Vital Signs Blood pressure 132/77, pulse 82, temperature 99.3 F (37.4 C), temperature source Oral, resp. rate 18, height 5\' 6"  (1.676 m), weight 112.9 kg, SpO2 94 %.  General: No acute distress Mood and affect are appropriate Heart: Regular rate and rhythm no rubs murmurs or extra sounds Lungs: Clear to auscultation, breathing unlabored, no rales or wheezes Abdomen: Positive bowel sounds, soft nontender to palpation, nondistended Extremities: No clubbing, cyanosis, or edema Skin: No evidence of breakdown, no evidence of rash Neurologic: Cranial nerves II through XII intact, motor strength is 5/5 in bilateral deltoid, bicep, tricep, grip, hip flexor, knee extensors, ankle dorsiflexor and plantar flexor Sensory exam normal sensation to light touch and proprioception in bilateral upper and lower extremities Cerebellar exam normal finger to nose to finger  in RIght upper , mild dysmetria , left upper  Musculoskeletal: Full range of motion in all 4 extremities. No joint swelling    Assessment/Plan: 1. Functional deficits which require 3+ hours per day of interdisciplinary therapy in a comprehensive inpatient rehab setting. Physiatrist is providing close team supervision and 24 hour management of active medical problems listed below. Physiatrist and rehab team continue to  assess barriers to discharge/monitor patient progress toward functional and medical goals  Care Tool:  Bathing              Bathing assist       Upper Body Dressing/Undressing Upper body dressing        Upper body assist      Lower Body Dressing/Undressing Lower body dressing            Lower body assist       Toileting Toileting    Toileting assist       Transfers Chair/bed transfer  Transfers assist           Locomotion Ambulation   Ambulation assist              Walk 10 feet activity   Assist           Walk 50 feet activity   Assist           Walk 150 feet activity   Assist           Walk 10 feet on uneven surface  activity   Assist           Wheelchair     Assist  Wheelchair 50 feet with 2 turns activity    Assist            Wheelchair 150 feet activity     Assist          Blood pressure 132/77, pulse 82, temperature 99.3 F (37.4 C), temperature source Oral, resp. rate 18, height 5\' 6"  (1.676 m), weight 112.9 kg, SpO2 94 %.  Medical Problem List and Plan: 1. 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             -patient may shower             -ELOS/Goals: 7-10 days/supervision/mod I             Admit to CIR PT, OT 2.  Antithrombotics: -DVT/anticoagulation:  Pharmaceutical: Lovenox             -antiplatelet therapy: DAPT X 3 weeks followed by ASA alone.  3. Chronic HA/Pain Management: Use tylenol prn.  4. Mood: LCSW to follow for evaluation and support.              -antipsychotic agents: N/a 5. Neuropsych: This patient is capable of making decisions on his own behalf. 6. Skin/Wound Care: Routine pressure relief measures.  7. Fluids/Electrolytes/Nutrition: Monitor I/Os             --CMP ordered for tomorrow 8. Chronic HFrEF/HTN:  On  Losartan, Coreg, hygroton, Crestor and Jardiance.  -- Daily weights for signs of overload.              --has seen Dr. Einar Gip in the past.  9.T2DM: Hgb A1C- 9.9 and poorly controlled as he feels weak if BS <200.              --Continue Jardiance, d/c metformin (he has been refusing and does not plan on taking it at home), insulin glargine  as at home and novolog tid AC             --Monitor with increased activity 10.  Dyslipidemia: LDL-121. Was non-compliant with meds. Continue to educate on compliance.              --On Crestor and Zetia.  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 13. HTN: Now back on home regimen of Cozaar, hygroton, Coreg. --will order orthostatic vitals.   --Monitor with increased mobility 14. Hypokalemia: Will add low dose supplement for recurrent hypokalemia (hygroton on board).              CMP ordered for tomorrow.K+ stable and normal  15. OSA: Machine broke/lack of insurance--will need sleep study after discharge.      LOS: 1 days A FACE TO FACE EVALUATION WAS PERFORMED  Charlett Blake 03/08/2021, 7:27 AM

## 2021-03-08 NOTE — Progress Notes (Signed)
Inpatient Rehabilitation  Patient information reviewed and entered into eRehab system by Harinder Romas Frances Ambrosino, OTR/L.   Information including medical coding, functional ability and quality indicators will be reviewed and updated through discharge.    

## 2021-03-08 NOTE — Plan of Care (Signed)
Problem: RH Balance Goal: LTG Patient will maintain dynamic standing with ADLs (OT) Description: LTG:  Patient will maintain dynamic standing balance with assist during activities of daily living (OT)  Flowsheets (Taken 03/08/2021 1455) LTG: Pt will maintain dynamic standing balance during ADLs with: Independent with assistive device   Problem: Sit to Stand Goal: LTG:  Patient will perform sit to stand in prep for activites of daily living with assistance level (OT) Description: LTG:  Patient will perform sit to stand in prep for activites of daily living with assistance level (OT) Flowsheets (Taken 03/08/2021 1455) LTG: PT will perform sit to stand in prep for activites of daily living with assistance level: Independent with assistive device   Problem: RH Eating Goal: LTG Patient will perform eating w/assist, cues/equip (OT) Description: LTG: Patient will perform eating with assist, with/without cues using equipment (OT) Flowsheets (Taken 03/08/2021 1455) LTG: Pt will perform eating with assistance level of: Independent   Problem: RH Grooming Goal: LTG Patient will perform grooming w/assist,cues/equip (OT) Description: LTG: Patient will perform grooming with assist, with/without cues using equipment (OT) Flowsheets (Taken 03/08/2021 1455) LTG: Pt will perform grooming with assistance level of: Independent with assistive device    Problem: RH Bathing Goal: LTG Patient will bathe all body parts with assist levels (OT) Description: LTG: Patient will bathe all body parts with assist levels (OT) Flowsheets (Taken 03/08/2021 1455) LTG: Pt will perform bathing with assistance level/cueing: Independent with assistive device    Problem: RH Dressing Goal: LTG Patient will perform upper body dressing (OT) Description: LTG Patient will perform upper body dressing with assist, with/without cues (OT). Flowsheets (Taken 03/08/2021 1455) LTG: Pt will perform upper body dressing with assistance  level of: Independent Goal: LTG Patient will perform lower body dressing w/assist (OT) Description: LTG: Patient will perform lower body dressing with assist, with/without cues in positioning using equipment (OT) Flowsheets (Taken 03/08/2021 1455) LTG: Pt will perform lower body dressing with assistance level of: Independent with assistive device   Problem: RH Toileting Goal: LTG Patient will perform toileting task (3/3 steps) with assistance level (OT) Description: LTG: Patient will perform toileting task (3/3 steps) with assistance level (OT)  Flowsheets (Taken 03/08/2021 1455) LTG: Pt will perform toileting task (3/3 steps) with assistance level: Independent with assistive device   Problem: RH Functional Use of Upper Extremity Goal: LTG Patient will use RT/LT upper extremity as a (OT) Description: LTG: Patient will use right/left upper extremity as a stabilizer/gross assist/diminished/nondominant/dominant level with assist, with/without cues during functional activity (OT) Flowsheets (Taken 03/08/2021 1455) LTG: Use of upper extremity in functional activities: LUE as nondominant level LTG: Pt will use upper extremity in functional activity with assistance level of: Independent with assistive device   Problem: RH Simple Meal Prep Goal: LTG Patient will perform simple meal prep w/assist (OT) Description: LTG: Patient will perform simple meal prep with assistance, with/without cues (OT). Flowsheets (Taken 03/08/2021 1455) LTG: Pt will perform simple meal prep with assistance level of: Independent with assistive device   Problem: RH Light Housekeeping Goal: LTG Patient will perform light housekeeping w/assist (OT) Description: LTG: Patient will perform light housekeeping with assistance, with/without cues (OT). Flowsheets (Taken 03/08/2021 1455) LTG: Pt will perform light housekeeping with assistance level of: Independent with assistive device   Problem: RH Toilet Transfers Goal: LTG  Patient will perform toilet transfers w/assist (OT) Description: LTG: Patient will perform toilet transfers with assist, with/without cues using equipment (OT) Flowsheets (Taken 03/08/2021 1455) LTG: Pt will perform toilet transfers  with assistance level of: Independent with assistive device   Problem: RH Tub/Shower Transfers Goal: LTG Patient will perform tub/shower transfers w/assist (OT) Description: LTG: Patient will perform tub/shower transfers with assist, with/without cues using equipment (OT) Flowsheets (Taken 03/08/2021 1455) LTG: Pt will perform tub/shower stall transfers with assistance level of: Independent with assistive device

## 2021-03-08 NOTE — Evaluation (Addendum)
Occupational Therapy Assessment and Plan  Patient Details  Name: Thomas Gardner MRN: [id number] Date of Birth: June 22, 1955  OT Diagnosis: abnormal posture, acute pain, ataxia, hemiplegia affecting non-dominant side, muscle weakness (generalized), and decreased dynamic standing balance and activity tolerance Rehab Potential: Rehab Potential (ACUTE ONLY): Good ELOS: 7 to 9 days   Today's Date: 03/08/2021 OT Individual Time: 5697-9480 OT Individual Time Calculation (min): 70 min     Hospital Problem: Principal Problem:   Acute stroke of medulla oblongata ([place])   Past Medical History:  Past Medical History:  Diagnosis Date   Arthritis    Asthma    CHF (congestive heart failure) ([place])    Diabetes mellitus 2003   GERD (gastroesophageal reflux disease)    Hyperlipidemia    Hypertension    Morbid obesity ([place])    Sleep apnea    Past Surgical History:  Past Surgical History:  Procedure Laterality Date   BREATH TEK H PYLORI  11/11/2011   Procedure: BREATH TEK H PYLORI;  Surgeon: Shann Medal, MD;  Location: [facility] ENDOS[facility];  Service: General;  Laterality: N/A;   CARDIAC CATHETERIZATION     ESOPHAGOGASTRODUODENOSCOPY (EGD) WITH PROPOFOL N/A 05/04/2014   Procedure: ESOPHAGOGASTRODUODENOSCOPY (EGD) WITH PROPOFOL;  Surgeon: Cleotis Nipper, MD;  Location: [facility] ENDOSCOPY;  Service: Endoscopy;  Laterality: N/A;    Assessment & Plan Clinical Impression: Patient is a 65 y.o. year old male with history of T2DM, HFrEF, HTN, OSA, headaches, morbid obesity, recent wisdom tooth extraction who was admitted on 03/02/2021 with reports of persistent HA, dizziness, and difficulty walking with tendency to fall to the left.  History taken from patient and chart review.  UDS positive for opiates. CTA head done revealing multifocal moderate/severe stenosis of right PCA, severe stenosis proximal L-ECA, incidental 3.6 cm right thyroid nodule and was negative for LVO.  MRI brain done revealing punctate focus of  abnormal diffusion restriction likely reflecting ischemia in left medullary pyramid.  Echocardiogram with ejection fraction of 50%, trivial MVR and TVR, no wall abnormality and low normal LVF. 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 recommended DAPT X 3 weeks followed by ASA alone as well control of risk factors. He has been followed by teaching service and thyroid ultrasound done showing slight interval increase in complex cystic mass in midline of neck with recommendations for ENT referral and stable right thyroid nodule.     Hospital course has been complicated by labile blood pressures and home meds slowly resumed. Patient with resultant dizziness, tremors with Left knee instability, weakness left hand?, shuffling/scissoring gait and balance deficits affecting mobility and ADLs. CIR recommended due to functional decline.  Please see preadmission assessment from earlier today as well. Patient transferred to CIR on 03/07/2021 .    Patient currently requires min with basic self-care skills and IADL secondary to muscle weakness, decreased cardiorespiratoy endurance, ataxia and decreased coordination, and decreased standing balance, hemiplegia, and decreased balance strategies.  Prior to hospitalization, patient could complete BADL/IADL/functional mobility with independent .  Patient will benefit from skilled intervention to increase independence with basic self-care skills and increase level of independence with iADL prior to discharge home with care partner.  Anticipate patient will require intermittent supervision and follow up outpatient.  OT - End of Session Activity Tolerance: Tolerates 30+ min activity with multiple rests Endurance Deficit: Yes Endurance Deficit Description: Requires increased time and to be seated to complete ADL. OT Assessment Rehab Potential (ACUTE ONLY): Good OT Patient demonstrates impairments  in the following area(s): Balance;Sensory;Skin  Integrity;Pain;Endurance;Motor OT Basic ADL's Functional Problem(s): Grooming;Bathing;Dressing;Toileting;Eating OT Advanced ADL's Functional Problem(s): Simple Meal Preparation;Light Housekeeping OT Transfers Functional Problem(s): Toilet;Tub/Shower OT Additional Impairment(s): Fuctional Use of Upper Extremity OT Plan OT Intensity: Minimum of 1-2 x/day, 45 to 90 minutes OT Frequency: 5 out of 7 days OT Duration/Estimated Length of Stay: 7 to 9 days OT Treatment/Interventions: Balance/vestibular training;Community reintegration;Disease mangement/prevention;Neuromuscular re-education;Patient/family education;Self Care/advanced ADL retraining;Therapeutic Exercise;UE/LE Coordination activities;Wheelchair propulsion/positioning;Discharge planning;DME/adaptive equipment instruction;Functional mobility training;Pain management;Psychosocial support;Skin care/wound managment;Therapeutic Activities;UE/LE Strength taining/ROM OT Self Feeding Anticipated Outcome(s): ind OT Basic Self-Care Anticipated Outcome(s): mod I OT Toileting Anticipated Outcome(s): mod I OT Bathroom Transfers Anticipated Outcome(s): mod I OT Recommendation Recommendations for Other Services: Therapeutic Recreation consult Therapeutic Recreation Interventions: Outing/community reintergration;Pet therapy;Kitchen group;Stress management Patient destination: Home Follow Up Recommendations: Outpatient OT Equipment Recommended: To be determined   OT Evaluation Precautions/Restrictions  Precautions Precautions: Fall Precaution Comments: tremors/shakiness, mild L hemi Restrictions Weight Bearing Restrictions: No General Chart Reviewed: Yes Response to Previous Treatment: Patient with no complaints from previous session Family/Caregiver Present: No  Pain headache, did not rate, RN made aware of request for pain rx Home Living/Prior [place] expects to be discharged to:: Private residence Living  Arrangements: Alone Available Help at Discharge: Friend(s), Available PRN/intermittently Type of Home: House Home Access: Stairs to enter [employer] of Steps: 1-2 Entrance Stairs-Rails: None Home Layout: Two level, Bed/bath upstairs Bathroom Shower/Tub: Chiropodist: Handicapped height Additional Comments: Pt states he would go home with 2 of his friends. pt reports that personal house is 1 story with 1 step to enter  Lives With: Alone IADL History Homemaking Responsibilities: Yes Meal Prep Responsibility: Primary Laundry Responsibility: Primary Cleaning Responsibility: Primary Bill Paying/Finance Responsibility: Primary Shopping Responsibility: Primary Child Care Responsibility: No Occupation: Full time employment Type of Occupation: Copy Leisure and Hobbies: watching history/discovery channel Prior Function Level of Independence: Independent with basic ADLs, Independent with homemaking with ambulation, Independent with transfers, Independent with gait  Able to Take Stairs?: Yes Vocation: Full time employment Comments: driving, 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[employer], [employer]) Vision Baseline Vision/History: 1 Wears glasses (readers, not present at hospital) Ability to See in Adequate Light: 0 Adequate Patient Visual Report: No change from baseline Vision Assessment?: Yes (WFL for ADL assessed) Eye Alignment: Within Functional Limits Ocular Range of Motion: Within Functional Limits Alignment/Gaze Preference: Within Defined Limits Tracking/Visual Pursuits: Decreased smoothness of vertical tracking;Decreased smoothness of horizontal tracking Saccades: Decreased speed of saccadic movement Convergence: Impaired (comment) Visual Fields: No apparent deficits Perception  Perception: Within Functional Limits Praxis Praxis: Intact Cognition Overall Cognitive Status: Within Functional Limits for tasks  assessed Arousal/Alertness: Awake/alert Orientation Level: Person;Place;Situation Person: Oriented Place: Oriented Situation: Oriented Year: 2022 Month: October Day of Week: Correct Memory: Appears intact Immediate Memory Recall: Sock;Blue;Bed Memory Recall Sock: Without Cue Memory Recall Blue: Without Cue Memory Recall Bed: Without Cue Attention: Divided Divided Attention: Appears intact Awareness: Appears intact Problem Solving: Appears intact Safety/Judgment: Appears intact Comments: Pt able to engage in [place] for caregiver assist with IADLs, DME recs, etc. Sensation Sensation Light Touch: Impaired by gross assessment Hot/Cold: Appears Intact Proprioception: Appears Intact Stereognosis: Appears Intact Additional Comments: peripheral neuropathy BLE Coordination Gross Motor Movements are Fluid and Coordinated: No Fine Motor Movements are Fluid and Coordinated: No Coordination and Movement Description: L sided ataxia, mild L hemi Finger Nose Finger Test: mild Ataxia LUE Motor  Motor Motor: Ataxia;Hemiplegia Motor - Skilled Clinical Observations: L sided  ataxia, mild L hemiplegia  Trunk/Postural Assessment  Cervical Assessment Cervical Assessment: Exceptions to [facility] (forward head) Thoracic Assessment Thoracic Assessment: Exceptions to [facility] (kyphotic) Lumbar Assessment Lumbar Assessment: Exceptions to [facility] (posterior pelvic tilt in sitting) Postural Control Postural Control: Within Functional Limits  Balance Balance Balance Assessed: Yes Static Sitting Balance Static Sitting - Balance Support: No upper extremity supported;Feet supported Static Sitting - Level of Assistance: 6: Modified independent (Device/Increase time) Dynamic Sitting Balance Dynamic Sitting - Balance Support: No upper extremity supported Dynamic Sitting - Level of Assistance: 5: Stand by assistance Static Standing Balance Static Standing - Balance Support: Bilateral upper extremity  supported;During functional activity Static Standing - Level of Assistance: 5: Stand by assistance Static Standing - Comment/# of Minutes: CGA Dynamic Standing Balance Dynamic Standing - Balance Support: Bilateral upper extremity supported;During functional activity Dynamic Standing - Level of Assistance: 5: Stand by assistance Dynamic Standing - Comments: CGA Extremity/Trunk Assessment RUE Assessment RUE Assessment: Within Functional Limits General Strength Comments: 5/5 in shoulder flexion LUE Assessment LUE Assessment: Exceptions to [facility] Active Range of Motion (AROM) Comments: WNL General Strength Comments: mild ataxia LUE Body System: Neuro Brunstrum levels for arm and hand: Arm;Hand Brunstrum level for arm: Stage V Relative Independence from Synergy Brunstrum level for hand: Stage VI Isolated joint movements  Care Tool Care Tool Self Care Eating   Eating Assist Level: Set up assist    Oral Care    Oral Care Assist Level: Set up assist (seated)    Bathing   Body parts bathed by patient: Right arm;Left arm;Chest;Abdomen;Face;Front perineal area;Right upper leg;Left upper leg           Upper Body Dressing(including orthotics)       Assist Level: Set up assist    Lower Body Dressing (excluding footwear)  Min A What is the patient wearing?: Pants      Putting on/Taking off footwear   What is the patient wearing?: Socks         Care Tool Toileting Toileting activity   CGA   Care Tool Bed Mobility Roll left and right activity   Roll left and right assist level: Supervision/Verbal cueing    Sit to lying activity   Sit to lying assist level: Supervision/Verbal cueing    Lying to sitting on side of bed activity   Lying to sitting on side of bed assist level: the ability to move from lying on the back to sitting on the side of the bed with no back support.: Supervision/Verbal cueing     Care Tool Transfers Sit to stand transfer   Sit to stand assist level:  Moderate Assistance - Patient 50 - 74%    Chair/bed transfer   Chair/bed transfer assist level: Moderate Assistance - Patient 50 - 74%     Toilet transfer   Min A   Care Tool Cognition  Expression of Ideas and Wants Expression of Ideas and Wants: 4. Without difficulty (complex and basic) - expresses complex messages without difficulty and with speech that is clear and easy to understand  Understanding Verbal and Non-Verbal Content Understanding Verbal and Non-Verbal Content: 4. Understands (complex and basic) - clear comprehension without cues or repetitionsx   Memory/Recall Ability Memory/Recall Ability : Current season;Location of own room;Staff names and faces;That he or she is in a hospital/hospital unitx   Refer to Care Plan for Long Term Goals  SHORT TERM GOAL WEEK 1 OT Short Term Goal 1 (Week 1): STG  = LTG 2/2 ELOS  Recommendations  for other services: Therapeutic Recreation  Pet therapy, Kitchen group, Stress management, and Outing/community reintegration   Skilled Therapeutic Intervention ADL ADL Eating: Set up Where Assessed-Eating: Chair Grooming: Setup Where Assessed-Grooming: Sitting at sink Upper Body Bathing: Setup Where Assessed-Upper Body Bathing: Sitting at sink Lower Body Bathing: Minimal assistance Where Assessed-Lower Body Bathing: Standing at sink Upper Body Dressing: Setup Where Assessed-Upper Body Dressing: Sitting at sink Lower Body Dressing: Contact guard Where Assessed-Lower Body Dressing: Standing at sink Toileting: Contact guard Where Assessed-Toileting: Glass blower/designer: Therapist, music Method: Educational psychologist Method: Psychologist, educational: Not assessed Mobility  Bed Mobility Bed Mobility: Not assessed Transfers Sit to Stand: Contact Guard/Touching assist  Session note: Pt received seated in recliner, c/o HA, RN made aware. Agreeable to OT eval.   Reviewed role of CIR OT, evaluation process, ADL/func mobility retraining, goals for therapy, and safety plan. Evaluation completed as documented above. Pt politely declines need for ADL as he already complete this AM. Per pt, required min A for LBD to thread LLE and to power up from sitting on toilet during transfer transfer. Interested in taking shower tomorrow. Reports at baseline he sits EOB and uses figure 4 technique on bed to perform LBD. Unable to replicate while seated in chair. Stand-pivot and short ambulatory transfers throughout session with RW and CGA for balance. Completes oral care with set-up A while seated in w/c at sink. Administered 9HPT as documented above.   Pt reports plan is to DC to friend's 2 story condo, able to live on first floor, but full bath is upstairs with tub/shower. Completed TTB transfer after visual demonstration with S. Will decide if he plans to purchase himself or through hospital.   Issued medium soft resistive theraputty and reviewed putty squeezes, and rolling putty into "log" and "ball" shapes to target improve LUE coordination. Able to remove and pick up beads from putty with L. Reports resistance as "challenging."  Short ambulatory transfer back to recliner same manner as before. Updated safety plan to reflect min assist of 1 for room level ambulation with RW.   Pt left seated in recliner with chair alarm engaged, call bell in reach, and all immediate needs met.   Discharge Criteria: Patient will be discharged from OT if patient refuses treatment 3 consecutive times without medical reason, if treatment goals not met, if there is a change in medical status, if patient makes no progress towards goals or if patient is discharged from hospital.  The above assessment, treatment plan, treatment alternatives and goals were discussed and mutually agreed upon: by patient  Volanda Napoleon MS, OTR/L  03/08/2021, 12:36 PM

## 2021-03-08 NOTE — Plan of Care (Signed)
  Problem: RH Balance Goal: LTG Patient will maintain dynamic sitting balance (PT) Description: LTG:  Patient will maintain dynamic sitting balance with assistance during mobility activities (PT) Flowsheets (Taken 03/08/2021 0905) LTG: Pt will maintain dynamic sitting balance during mobility activities with:: Independent with assistive device  Goal: LTG Patient will maintain dynamic standing balance (PT) Description: LTG:  Patient will maintain dynamic standing balance with assistance during mobility activities (PT) Flowsheets (Taken 03/08/2021 0905) LTG: Pt will maintain dynamic standing balance during mobility activities with:: Supervision/Verbal cueing   Problem: RH Bed Mobility Goal: LTG Patient will perform bed mobility with assist (PT) Description: LTG: Patient will perform bed mobility with assistance, with/without cues (PT). Flowsheets (Taken 03/08/2021 0905) LTG: Pt will perform bed mobility with assistance level of: Independent with assistive device    Problem: RH Bed to Chair Transfers Goal: LTG Patient will perform bed/chair transfers w/assist (PT) Description: LTG: Patient will perform bed to chair transfers with assistance (PT). Flowsheets (Taken 03/08/2021 0905) LTG: Pt will perform Bed to Chair Transfers with assistance level: Independent with assistive device    Problem: RH Car Transfers Goal: LTG Patient will perform car transfers with assist (PT) Description: LTG: Patient will perform car transfers with assistance (PT). Flowsheets (Taken 03/08/2021 0905) LTG: Pt will perform car transfers with assist:: Supervision/Verbal cueing   Problem: RH Furniture Transfers Goal: LTG Patient will perform furniture transfers w/assist (OT/PT) Description: LTG: Patient will perform furniture transfers  with assistance (OT/PT). Flowsheets (Taken 03/08/2021 0905) LTG: Pt will perform furniture transfers with assist:: Independent with assistive device    Problem: RH  Ambulation Goal: LTG Patient will ambulate in controlled environment (PT) Description: LTG: Patient will ambulate in a controlled environment, # of feet with assistance (PT). Flowsheets (Taken 03/08/2021 0905) LTG: Pt will ambulate in controlled environ  assist needed:: Supervision/Verbal cueing LTG: Ambulation distance in controlled environment: 111ft with LRAD Goal: LTG Patient will ambulate in home environment (PT) Description: LTG: Patient will ambulate in home environment, # of feet with assistance (PT). Flowsheets (Taken 03/08/2021 0905) LTG: Pt will ambulate in home environ  assist needed:: Supervision/Verbal cueing LTG: Ambulation distance in home environment: 55ft with LRAD   Problem: RH Wheelchair Mobility Goal: LTG Patient will propel w/c in controlled environment (PT) Description: LTG: Patient will propel wheelchair in controlled environment, # of feet with assist (PT) Flowsheets (Taken 03/08/2021 0905) LTG: Pt will propel w/c in controlled environ  assist needed:: Supervision/Verbal cueing LTG: Propel w/c distance in controlled environment: 144ft   Problem: RH Stairs Goal: LTG Patient will ambulate up and down stairs w/assist (PT) Description: LTG: Patient will ambulate up and down # of stairs with assistance (PT) Flowsheets (Taken 03/08/2021 0905) LTG: Pt will ambulate up/down stairs assist needed:: Supervision/Verbal cueing LTG: Pt will  ambulate up and down number of stairs: 1 step with use of RW for safety.

## 2021-03-08 NOTE — Progress Notes (Signed)
[facility] Individual Statement of Services  Patient Name:  Thomas Gardner  Date:  03/08/2021  Welcome to the [facility].  Our goal is to provide you with an individualized program based on your diagnosis and situation, designed to meet your specific needs.  With this comprehensive rehabilitation program, you will be expected to participate in at least 3 hours of rehabilitation therapies Monday-Friday, with modified therapy programming on the weekends.  Your rehabilitation program will include the following services:  Physical Therapy (PT), Occupational Therapy (OT), Speech Therapy (ST), 24 hour per day rehabilitation nursing, Therapeutic Recreaction (TR), Neuropsychology, Care Coordinator, Rehabilitation Medicine, Nutrition Services, Pharmacy Services, and Other  Weekly team conferences will be held on Wednesdays to discuss your progress.  Your Inpatient Rehabilitation Care Coordinator will talk with you frequently to get your input and to update you on team discussions.  Team conferences with you and your family in attendance may also be held.  Expected length of stay:  5-7 Days  Overall anticipated outcome:  MOD I  Depending on your progress and recovery, your program may change. Your Inpatient Rehabilitation Care Coordinator will coordinate services and will keep you informed of any changes. Your Inpatient Rehabilitation Care Coordinator's name and contact numbers are listed  below.  The following services may also be recommended but are not provided by the [facility]:   [place] will be made to provide these services after discharge if needed.  Arrangements include referral to agencies that provide these services.  Your insurance has been verified to be:  [facility] [employer] Your primary doctor is:  Scarlett Presto, MD  Pertinent information will be  shared with your doctor and your insurance company.  Inpatient Rehabilitation Care Coordinator:  Erlene Quan, [place] or [phone]  Information discussed with and copy given to patient by: Dyanne Iha, 03/08/2021, 11:39 AM

## 2021-03-08 NOTE — Evaluation (Signed)
Physical Therapy Assessment and Plan  Patient Details  Name: Thomas Gardner MRN: [id number] Date of Birth: 10/12/1955  PT Diagnosis: Abnormal posture, Abnormality of gait, Ataxia, Ataxic gait, Coordination disorder, Hemiplegia non-dominant, Impaired sensation, and Muscle weakness Rehab Potential: Good ELOS: 10-14 days   Today's Date: 03/08/2021 PT Individual Time: 810-905 PT Individual Time Calculation (min): 55 min    Hospital Problem: Principal Problem:   Acute stroke of medulla oblongata (HCC)   Past Medical History:  Past Medical History:  Diagnosis Date   Arthritis    Asthma    CHF (congestive heart failure) (HCC)    Diabetes mellitus 2003   GERD (gastroesophageal reflux disease)    Hyperlipidemia    Hypertension    Morbid obesity (HCC)    Sleep apnea    Past Surgical History:  Past Surgical History:  Procedure Laterality Date   BREATH TEK H PYLORI  11/11/2011   Procedure: BREATH TEK H PYLORI;  Surgeon: David H Newman, MD;  Location: [facility];  Service: General;  Laterality: N/A;   CARDIAC CATHETERIZATION     ESOPHAGOGASTRODUODENOSCOPY (EGD) WITH PROPOFOL N/A 05/04/2014   Procedure: ESOPHAGOGASTRODUODENOSCOPY (EGD) WITH PROPOFOL;  Surgeon: Robert Buccini V, MD;  Location: [facility];  Service: Endoscopy;  Laterality: N/A;    Assessment & Plan Clinical Impression: Patient is a 65 year old Rh-male with  history of T2DM, HFrEF, HTN, OSA, headaches, morbid obesity, recent wisdom tooth extraction who was admitted on 03/02/2021 with reports of persistent HA, dizziness, and difficulty walking with tendency to fall to the left.  History taken from patient and chart review.  UDS positive for opiates. CTA head done revealing multifocal moderate/severe stenosis of right PCA, severe stenosis proximal L-ECA, incidental 3.6 cm right thyroid nodule and was negative for LVO.  MRI brain done revealing punctate focus of abnormal diffusion restriction likely reflecting ischemia in  left medullary pyramid.  Echocardiogram with ejection fraction of 50%, trivial MVR and TVR, no wall abnormality and low normal LVF. 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 Xu recommended DAPT X 3 weeks followed by ASA alone as well control of risk factors. He has been followed by teaching service and thyroid ultrasound done showing slight interval increase in complex cystic mass in midline of neck with recommendations for ENT referral and stable right thyroid nodule.     Hospital course has been complicated by labile blood pressures and home meds slowly resumed. Patient with resultant dizziness, tremors with Left knee instability, weakness left hand?, shuffling/scissoring gait and balance deficits affecting mobility and ADLs. CIR recommended due to functional decline. Patient transferred to CIR on 03/07/2021 .   Patient currently requires mod with mobility secondary to muscle weakness and muscle joint tightness, decreased cardiorespiratoy endurance, unbalanced muscle activation, ataxia, and decreased coordination, decreased midline orientation, and decreased sitting balance, decreased standing balance, decreased postural control, hemiplegia, and decreased balance strategies.  Prior to hospitalization, patient was independent  with mobility and lived with Alone in a House home.  Home access is 1-2Stairs to enter.  Patient will benefit from skilled PT intervention to maximize safe functional mobility, minimize fall risk, and decrease caregiver burden for planned discharge home with intermittent assist.  Anticipate patient will benefit from follow up HH at discharge.  PT - End of Session Activity Tolerance: Tolerates < 10 min activity, no significant change in vital signs Endurance Deficit: Yes PT Assessment Rehab Potential (ACUTE/IP ONLY): Good PT Barriers to Discharge: Inaccessible home environment;Decreased caregiver support;Home environment   access/layout;Lack of/limited family  support;Insurance for SNF coverage;Weight;Medication compliance PT Patient demonstrates impairments in the following area(s): Balance;Edema;Endurance;Motor;Perception;Safety;Sensory PT Transfers Functional Problem(s): Bed Mobility;Bed to Chair;Car;Furniture;Floor PT Locomotion Functional Problem(s): Ambulation;Wheelchair Mobility;Stairs PT Plan PT Intensity: Minimum of 1-2 x/day ,45 to 90 minutes PT Frequency: 5 out of 7 days PT Duration Estimated Length of Stay: 10-14 days PT Treatment/Interventions: Ambulation/gait training;Balance/vestibular training;Cognitive remediation/compensation;Disease management/prevention;Community reintegration;DME/adaptive equipment instruction;Discharge planning;Functional electrical stimulation;Functional mobility training;Patient/family education;Pain management;Neuromuscular re-education;Psychosocial support;Skin care/wound management;Splinting/orthotics;Therapeutic Exercise;Therapeutic Activities;Stair training;UE/LE Strength taining/ROM;UE/LE Coordination activities;Wheelchair propulsion/positioning PT Transfers Anticipated Outcome(s): Mod I with LRAD PT Locomotion Anticipated Outcome(s): supervision assist ambulation with LRAD PT Recommendation Recommendations for Other Services: Therapeutic Recreation consult Therapeutic Recreation Interventions: Stress management;Outing/community reintergration Follow Up Recommendations: Home health PT Patient destination: Home Equipment Recommended: To be determined;Rolling walker with 5" wheels   PT Evaluation Precautions/Restrictions Precautions Precautions: Fall Precaution Comments: tremors/shakiness General   Vital Signs  Pain Pain Assessment Pain Scale: 0-10 Pain Score: 0-No pain Pain Interference Pain Interference Pain Effect on Sleep: 1. Rarely or not at all Pain Interference with Therapy Activities: 2. Occasionally Pain Interference with Day-to-Day Activities: 2. Occasionally Home Living/Prior  Functioning Home Living Available Help at Discharge: Friend(s);Available PRN/intermittently Type of Home: House Home Access: Stairs to enter Entrance Stairs-Number of Steps: 1-2 Entrance Stairs-Rails: None Home Layout: Two level;Bed/bath upstairs Bathroom Shower/Tub: Tub/shower unit Bathroom Toilet: Handicapped height Additional Comments: Pt states he would go home with 2 of his friends. 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[employer], [employer]) Vision/Perception  Vision - History Ability to See in Adequate Light: 0 Adequate Perception Perception: Within Functional Limits Praxis Praxis: Intact  Cognition   Sensation Sensation Light Touch: Impaired by gross assessment Additional Comments: peripheral neuropathy BLE Coordination Gross Motor Movements are Fluid and Coordinated: No Fine Motor Movements are Fluid and Coordinated: No Coordination and Movement Description: L sided ataxia Finger Nose Finger Test: Ataxia LUE Heel Shin Test: LUE Ataxia Motor  Motor Motor: Ataxia;Hemiplegia Motor - Skilled Clinical Observations: L sided ataxia, mild L hemiplegia   Trunk/Postural Assessment     Balance Balance Balance Assessed: Yes Static Sitting Balance Static Sitting - Balance Support: No upper extremity supported Static Sitting - Level of Assistance: 5: Stand by assistance Dynamic Sitting Balance Dynamic Sitting - Balance Support: No upper extremity supported Dynamic Sitting - Level of Assistance: 5: Stand by assistance;4: Min assist Static Standing Balance Static Standing - Balance Support: No upper extremity supported Static Standing - Level of Assistance: 4: Min assist Dynamic Standing Balance Dynamic  Standing - Balance Support: No upper extremity supported Dynamic Standing - Level of Assistance: 4: Min assist;3: Mod assist Extremity Assessment      RLE Assessment RLE Assessment: Within Functional Limits General Strength Comments: grossly 4+/5 to 5/5 proximal to distal LLE Assessment LLE Assessment: Exceptions to WFL LLE Strength Left Hip Flexion: 4-/5 Left Hip Extension: 4-/5 Left Hip ABduction: 4-/5 Left Hip ADduction: 4-/5 Left Knee Flexion: 4-/5 Left Knee Extension: 4-/5 Left Ankle Dorsiflexion: 4-/5 Left Ankle Plantar Flexion: 4-/5  Care Tool Care Tool Bed Mobility Roll left and right activity   Roll left and right assist level: Supervision/Verbal cueing    Sit to lying activity   Sit to lying assist level: Supervision/Verbal cueing    Lying to   sitting on side of bed activity   Lying to sitting on side of bed assist level: the ability to move from lying on the back to sitting on the side of the bed with no back support.: Supervision/Verbal cueing     Care Tool Transfers Sit to stand transfer   Sit to stand assist level: Moderate Assistance - Patient 50 - 74%    Chair/bed transfer   Chair/bed transfer assist level: Moderate Assistance - Patient 50 - 74%     Toilet transfer        Car transfer   Car transfer assist level: Moderate Assistance - Patient 50 - 74%      Care Tool Locomotion Ambulation   Assist level: Moderate Assistance - Patient 50 - 74% Assistive device: Hand held assist Max distance: 10ft  Walk 10 feet activity   Assist level: Moderate Assistance - Patient - 50 - 74% Assistive device: Hand held assist   Walk 50 feet with 2 turns activity Walk 50 feet with 2 turns activity did not occur: Safety/medical concerns      Walk 150 feet activity Walk 150 feet activity did not occur: Safety/medical concerns      Walk 10 feet on uneven surfaces activity Walk 10 feet on uneven surfaces activity did not occur: Safety/medical concerns       Stairs Stair activity did not occur: Safety/medical concerns        Walk up/down 1 step activity Walk up/down 1 step or curb (drop down) activity did not occur: Safety/medical concerns     Walk up/down 4 steps activity did not occuR: Safety/medical concerns  Walk up/down 4 steps activity      Walk up/down 12 steps activity Walk up/down 12 steps activity did not occur: Safety/medical concerns      Pick up small objects from floor Pick up small object from the floor (from standing position) activity did not occur: Safety/medical concerns      Wheelchair   Type of Wheelchair: Manual   Wheelchair assist level: Minimal Assistance - Patient > 75% Max wheelchair distance: 100  Wheel 50 feet with 2 turns activity   Assist Level: Minimal Assistance - Patient > 75%  Wheel 150 feet activity   Assist Level: Moderate Assistance - Patient 50 - 74%    Refer to Care Plan for Long Term Goals  SHORT TERM GOAL WEEK 1 PT Short Term Goal 1 (Week 1): Pt will Propell WC 150ft with supevisoin assist PT Short Term Goal 2 (Week 1): Pt will trasnfer to and from WC with CGA and LRAD PT Short Term Goal 3 (Week 1): Pt will ambulate 100ft with min A. PT Short Term Goal 4 (Week 1): Pt will ascend 1 step with RW and min assist  Recommendations for other services: Therapeutic Recreation  Stress management and Outing/community reintegration  Skilled Therapeutic Intervention Pt received supine in bed and agreeable to PT. Supine>sit transfer with supervision assist and cues for safety. Pt reports need for BM. Ambulatory transfer to toilet with RW and min assist with cuesf for safety and AD management in turns. Pt able to void bowels and bladder with supervision assist for pericare and then min clothing management. PT instructed patient in PT Evaluation and initiated treatment intervention; see above for results. PT educated patient in POC, rehab potential, rehab goals, and discharge recommendations along with  recommendation for follow-up rehabilitation services. Gait training without AD x 10ft wth mod assists. Stand pivot transfer with mod assist and cues for   UE support on PT for safety. Stair management with min assist as listed below. Patient returned to room and performed stand pivot to recliner with min assist and RW. Pt left sitting in recliner with call bell in reach and all needs met.       Mobility Bed Mobility Bed Mobility: Rolling Right;Rolling Left;Sit to Supine;Supine to Sit Rolling Right: Supervision/verbal cueing Rolling Left: Supervision/Verbal cueing Supine to Sit: Supervision/Verbal cueing Sit to Supine: Supervision/Verbal cueing Transfers Transfers: Sit to Stand;Stand Pivot Transfers Sit to Stand: Moderate Assistance - Patient 50-74% Stand Pivot Transfers: Moderate Assistance - Patient 50 - 74% Transfer (Assistive device): None Locomotion  Gait Ambulation: Yes Gait Assistance: Minimal Assistance - Patient > 75% Gait Distance (Feet): 25 Feet Assistive device: Rolling walker Gait Gait: Yes Gait Pattern: Impaired Gait Pattern: Shuffle;Lateral trunk lean to left;Ataxic;Decreased step length - left Stairs / Additional Locomotion Stairs: Yes Stairs Assistance: Minimal Assistance - Patient > 75%;Moderate Assistance - Patient 50 - 74% Stair Management Technique: Two rails Number of Stairs: 4 Height of Stairs: 3 Wheelchair Mobility Wheelchair Mobility: Yes Wheelchair Assistance: Minimal assistance - Patient >75% Wheelchair Propulsion: Both upper extremities Wheelchair Parts Management: Needs assistance Distance: 100   Discharge Criteria: Patient will be discharged from PT if patient refuses treatment 3 consecutive times without medical reason, if treatment goals not met, if there is a change in medical status, if patient makes no progress towards goals or if patient is discharged from hospital.  The above assessment, treatment plan, treatment alternatives and goals were  discussed and mutually agreed upon: by patient  Lorie Phenix 03/08/2021, 9:12 AM

## 2021-03-09 LAB — GLUCOSE, CAPILLARY
Glucose-Capillary: 163 mg/dL — ABNORMAL HIGH (ref 70–99)
Glucose-Capillary: 244 mg/dL — ABNORMAL HIGH (ref 70–99)
Glucose-Capillary: 201 mg/dL — ABNORMAL HIGH (ref 70–99)
Glucose-Capillary: 186 mg/dL — ABNORMAL HIGH (ref 70–99)

## 2021-03-09 NOTE — Progress Notes (Signed)
Physical Therapy Session Note  Patient Details  Name: Thomas Gardner MRN: [id number] Date of Birth: [birth year]  Today's Date: 03/09/2021 PT Individual Time: 1105-1201 PT Individual Time Calculation (min): 56 min   Short Term Goals: Week 1:  PT Short Term Goal 1 (Week 1): Pt will Propell WC 197f with supevisoin assist PT Short Term Goal 2 (Week 1): [facility] Short Term Goal 3 (Week 1): Pt will ambulate 1041fwith min A. PT Short Term Goal 4 (Week 1): Pt will ascend 1 step with RW and min assist   Skilled Therapeutic Interventions/Progress Updates:   Pt received sitting in WC and agreeable to PT  WC mobility to force use of the LUE x 15078fith min assist and cues for turn control.   Dynamic standing balance in parallel bars.  Reciprocal Foot tap on targets x 10 BLE. Tapping on 2 targets x 10 10 BLE. Random foot tap on 1 of 3 targets.  Forward Foot tap on 4inch step. Lateral foot tap on 4inch step x 10 each with BLE.  Step up to 4 inch step x 8 BLE.  Forward/reverse gait in parallel bars [id number] with min assist.  Min assist from PT with mild knee instability on BLE but no LOB noted. Cues for weight shift to the R to allow improved step height on the LLE.   Gait training in hall x 65ft63f min assist and cues for improved midline orientation and step length on BLE. Pt reports need for urination. Ambulatory transfer to toilet with min assist and UE support on RW. Pt able to void sitting on toilet with supervision assist. Patient returned to room and performed stand pivot to recliner with RW and min assist. . Pt left sitting in recliner with call bell in reach and all needs met.         Therapy Documentation Precautions:  Precautions Precautions: Fall Precaution Comments: tremors/shakiness, mild L hemi Restrictions Weight Bearing Restrictions: No  Vital Signs: Therapy Vitals Temp: 97.9 F (36.6 C) Pulse Rate: 72 Resp: 16 BP:  125/60 Patient Position (if appropriate): Sitting Oxygen Therapy SpO2: 99 % O2 Device: Room Air Pain:  denies  Therapy/Group: Individual Therapy  AustiLorie Phenix1/2022, 3:14 PM

## 2021-03-09 NOTE — Progress Notes (Signed)
Occupational Therapy Session Note  Patient Details  Name: Thomas Gardner MRN: [id number] Date of Birth: 06-Apr-1956  Today's Date: 03/09/2021 OT Individual Time: 8177-1165 OT Individual Time Calculation (min): 53 min + 30 min   Short Term Goals: Week 1:  OT Short Term Goal 1 (Week 1): STG  = LTG 2/2 ELOS  Skilled Therapeutic Interventions/Progress Updates:    Session 1 ([id number]): Pt received seated EOB finishing breakfast, denies pain, agreeable to therapy. Session focus on self-care retraining, activity tolerance, functional transfers, stroke sx education in prep for improved ADL/IADL/func mobility performance + decreased caregiver burden. Sit to stand and short distance ambulation to toilet with CGA + RW, noted "shakiness" in LLE with WB. Completed toilet transfer with CGA for balance and increased time to perform LB dressing. Distant S for toileting tasks post continent void with use of grab bars for sit to stand. Transitioned to TTB via ambulatory transfer with CGA and RW. Able to complete full-body bathing at seated level with close S. Donned underwear with CGA and use of grab bars for sit to stand.   Ambulated to sink with increased time in similar manner, reports feeling "less shaky than  yesterday." Noted decreased step length. Completed seated grooming with mod I. UB dressing with ind from seated position. Donned pants with CGA for sit to stand and required increased time to rest prior to standing. Total A to doff/don socks. Stand-pivot to recliner in similar manner.  Issued Stroke Support Group flyer, reviewed "Be Fast" acronym illustrated on flyer and importance of recognizing stroke symptoms. Pt verbalized understanding.  Pt left seated in recliner with bed alarm engaged, call bell in reach, and all immediate needs met.    Session 2 [phone]): Pt received in w/c in hallway, pass off from PT, agreeable to therapy, no c/o pain. Session focus on self-care retraining, activity  tolerance, LUE NMR, BUE strengthening in prep for improved ADL/IADL/func mobility performance + decreased caregiver burden. Seated at [facility], completed 2 rounds of single target game with LUE to target LUE coordination/ataxia:  -64.62%, 2 min, 2.80 reaction time, 42 hits  -49.25%, 2 min, 2.84 reaction time, 42 hits, with use of 3 lb wrist weight HR post activity at 71 bpm.  Completed 1x15 of the following with BUE + level 4 resistive theraband: forward punches and cross body reaches  Ambulatory transfer to toilet > recliner with CGA and RW, noted improved step length compared to am session. Pt ind recalling  " to lean R" during gait from previous PT session. Completed toileting tasks with distant S post continent void of urine. Ambulatory transfer back to recliner in similar manner.   Pt left seated in recliner with chair alarm engaged, call bell in reach, and all immediate needs met.    Therapy Documentation Precautions:  Precautions Precautions: Fall Precaution Comments: tremors/shakiness, mild L hemi Restrictions Weight Bearing Restrictions: No  Pain: denies   ADL: See Care Tool for more details.  Therapy/Group: Individual Therapy  Volanda Napoleon MS, OTR/L  03/09/2021, 6:35 AM

## 2021-03-09 NOTE — Progress Notes (Addendum)
PROGRESS NOTE   Subjective/Complaints:  Was nauseated yesterday pm but ok today , moving bowels ok no abd pain   ROS- neg CP, SOB, N/V/D  Objective:   No results found. Recent Labs    03/08/21 0613  WBC 8.5  HGB 13.7  HCT 42.1  PLT 217   Recent Labs    03/07/21 0252 03/08/21 0613  NA 136 137  K 3.8 3.7  CL 102 101  CO2 22 26  GLUCOSE 151* 179*  BUN 23 29*  CREATININE 1.18 1.38*  CALCIUM 9.2 9.1     Intake/Output Summary (Last 24 hours) at 03/09/2021 1572 Last data filed at 03/08/2021 1300 Gross per 24 hour  Intake 380 ml  Output --  Net 380 ml         Physical Exam: Vital Signs Blood pressure 140/61, pulse 77, temperature 98.1 F (36.7 C), resp. rate 18, height 5\' 6"  (1.676 m), weight 112.9 kg, SpO2 96 %.   General: No acute distress Mood and affect are appropriate Heart: Regular rate and rhythm no rubs murmurs or extra sounds Lungs: Clear to auscultation, breathing unlabored, no rales or wheezes Abdomen: Positive bowel sounds, soft nontender to palpation, nondistended Extremities: No clubbing, cyanosis, or edema Skin: No evidence of breakdown, no evidence of rash   Neurologic: Cranial nerves II through XII intact, motor strength is 5/5 in bilateral deltoid, bicep, tricep, grip, hip flexor, knee extensors, ankle dorsiflexor and plantar flexor Sensory exam normal sensation to light touch and proprioception in bilateral upper and lower extremities Cerebellar exam normal finger to nose to finger  in RIght upper , mild dysmetria , left upper  Musculoskeletal: Full range of motion in all 4 extremities. No joint swelling    Assessment/Plan: 1. Functional deficits which require 3+ hours per day of interdisciplinary therapy in a comprehensive inpatient rehab setting. Physiatrist is providing close team supervision and 24 hour management of active medical problems listed below. Physiatrist and rehab  team continue to assess barriers to discharge/monitor patient progress toward functional and medical goals  Care Tool:  Bathing    Body parts bathed by patient: Right arm, Left arm, Chest, Abdomen, Face, Front perineal area, Right upper leg, Left upper leg   Body parts bathed by helper: Buttocks, Left lower leg, Right lower leg     Bathing assist Assist Level: Minimal Assistance - Patient > 75%     Upper Body Dressing/Undressing Upper body dressing   What is the patient wearing?: Pull over shirt    Upper body assist Assist Level: Set up assist    Lower Body Dressing/Undressing Lower body dressing      What is the patient wearing?: Pants     Lower body assist Assist for lower body dressing: Minimal Assistance - Patient > 75% (sitting EOB)     Toileting Toileting    Toileting assist Assist for toileting: Contact Guard/Touching assist     Transfers Chair/bed transfer  Transfers assist     Chair/bed transfer assist level: Moderate Assistance - Patient 50 - 74%     Locomotion Ambulation   Ambulation assist      Assist level: Moderate Assistance - Patient 50 - 74%  Assistive device: Hand held assist Max distance: 31ft   Walk 10 feet activity   Assist     Assist level: Moderate Assistance - Patient - 50 - 74% Assistive device: Hand held assist   Walk 50 feet activity   Assist Walk 50 feet with 2 turns activity did not occur: Safety/medical concerns         Walk 150 feet activity   Assist Walk 150 feet activity did not occur: Safety/medical concerns         Walk 10 feet on uneven surface  activity   Assist Walk 10 feet on uneven surfaces activity did not occur: Safety/medical concerns         Wheelchair     Assist   Type of Wheelchair: Manual    Wheelchair assist level: Minimal Assistance - Patient > 75% Max wheelchair distance: 100    Wheelchair 50 feet with 2 turns activity    Assist        Assist Level:  Minimal Assistance - Patient > 75%   Wheelchair 150 feet activity     Assist      Assist Level: Moderate Assistance - Patient 50 - 74%   Blood pressure 140/61, pulse 77, temperature 98.1 F (36.7 C), resp. rate 18, height 5\' 6"  (1.676 m), weight 112.9 kg, SpO2 96 %.  Medical Problem List and Plan: 1. 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             -patient may shower             -ELOS/Goals: 7-10 days/supervision/mod I             Admit to CIR PT, OT 2.  Antithrombotics: -DVT/anticoagulation:  Pharmaceutical: Lovenox             -antiplatelet therapy: DAPT X 3 weeks followed by ASA alone.  3. Chronic HA/Pain Management: Use tylenol prn.  4. Mood: LCSW to follow for evaluation and support.              -antipsychotic agents: N/a 5. Neuropsych: This patient is capable of making decisions on his own behalf. 6. Skin/Wound Care: Routine pressure relief measures.  7. Fluids/Electrolytes/Nutrition: Monitor I/Os             --CMP ordered for tomorrow 8. Chronic HFrEF/HTN:  On Losartan, Coreg, hygroton, Crestor and Jardiance.  -- Daily weights for signs of overload.              --has seen Dr. Einar Gip in the past.  9.T2DM: Hgb A1C- 9.9 and poorly controlled as he feels weak if BS <200.              --Continue Jardiance, d/c metformin (he has been refusing and does not plan on taking it at home), insulin glargine  as at home and novolog tid AC CBG (last 3)  Recent Labs    03/08/21 1648 03/08/21 2002 03/09/21 0552  GLUCAP 156* 161* 163*   Fair control 10/21 10.  Dyslipidemia: LDL-121. Was non-compliant with meds. Continue to educate on compliance.              --On Crestor and Zetia.  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 13. HTN: Now back on home regimen of Cozaar, hygroton, Coreg. --will order orthostatic vitals.   Vitals:   03/08/21 1959 03/09/21 0549   BP: 109/64 140/61  Pulse: 77 77  Resp: 18  18  Temp: 98.1 F (36.7 C) 98.1 F (36.7 C)  SpO2: 98% 96%   Controlled 10/21 14. Hypokalemia: Will add low dose supplement for recurrent hypokalemia (hygroton on board).              CMP ordered for tomorrow.K+ stable and normal  15. OSA: Machine broke/lack of insurance--will need sleep study after discharge.      LOS: 2 days A FACE TO FACE EVALUATION WAS PERFORMED  Charlett Blake 03/09/2021, 6:23 AM

## 2021-03-09 NOTE — Progress Notes (Signed)
Physical Therapy Session Note  Patient Details  Name: Thomas Gardner MRN: [id number] Date of Birth: 09-Apr-1956  Today's Date: 03/09/2021 PT Individual Time: 1302-1400 PT Individual Time Calculation (min): 58 min   Short Term Goals: Week 1:  PT Short Term Goal 1 (Week 1): Pt will Propell WC 140ft with supevisoin assist PT Short Term Goal 2 (Week 1): Pt will trasnfer to and from [facility] with CGA and LRAD PT Short Term Goal 3 (Week 1): Pt will ambulate 179ft with min A. PT Short Term Goal 4 (Week 1): Pt will ascend 1 step with RW and min assist  Skilled Therapeutic Interventions/Progress Updates:  Patient seated upright in w/c on entrance to room. Patient alert and agreeable to PT session.   Patient with no pain complaint throughout session.  Therapeutic Activity: Transfers: Patient performed sit<>stand initially to RW with extra time for repositioning feet during rise to stand and min/ ModA. Improves during session to light MinA with NMR for improving forward lean. Mod vc throughout for improving overall technique including scoot forward, hand/foot positioning, and forward lean. Tremors noted in R hand with reach back to w/c armrests with each descent to sit.   Neuromuscular Re-ed: NMR facilitated during session with focus on standing balance, LE coordination, bodily proprioception, midline orientation. At stairs, pt guided in Toe touches to 6' step with R and L foot x10. Progressing RLE to toe touches to 1st, then 2nd, then 1st step x10. Progressing LLE to toe touches to 8" height with x10. With encouragement, good performance and high effort from pt.  Sit <>stand practice to RW initially to work on technique. Then progressed to no AD in order to work on pt's self confidence. Standing balance challenged with standing with no UE support and use of mirror for visual proprioceptive feedback on midline orientation. Pt demos lability after performance with no AD stating that confidence and self  efficacy is low d/t L weakness and general trunk ataxic movements.   NMR performed for improvements in motor control and coordination, balance, sequencing, judgement, and self confidence/ efficacy in performing all aspects of mobility at highest level of independence.   Gait Training:  Patient ambulated 42 ft using RW with Min/ ModA for guard to L knee and increasing assist to correct L sided lean with fatigue. Demonstrated very slow pace with decreased step height/ length on LLE, increased fatigue/ trembling in R knee with distance. Provided vc/ tc for upright posture, forward, level gaze, increased step height/ length with LLE. Reminders for resting when fatigued instead of pushing through in order to reduce ataxia.   Patient handed off to OT for next session while seated in w/c in hallway.   Therapy Documentation Precautions:  Precautions Precautions: Fall Precaution Comments: tremors/shakiness, mild L hemi Restrictions Weight Bearing Restrictions: No    Pain:  Pt with no pain complaint this session.  Therapy/Group: Individual Therapy  Alger Simons PT, DPT 03/09/2021, 6:53 PM

## 2021-03-09 NOTE — IPOC Note (Signed)
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 [facility])  Hospital Problems: Principal Problem:   Acute stroke of medulla oblongata ([place])     Functional Problem List: Nursing Bowel, Medication Management, Safety, Endurance  PT Balance, Edema, Endurance, Motor, Perception, Safety, Sensory  OT Balance, Sensory, Skin Integrity, Pain, Endurance, Motor  SLP    TR         Basic ADL's: OT Grooming, Bathing, Dressing, Toileting, Eating     Advanced  ADL's: OT Simple Meal Preparation, Light Housekeeping     Transfers: PT Bed Mobility, Bed to Chair, Car, Furniture, Futures trader, Metallurgist: PT Ambulation, Emergency planning/management officer, Stairs     Additional Impairments: OT Fuctional Use of Upper Extremity  SLP        TR      Anticipated Outcomes Item Anticipated Outcome  Self Feeding ind  Swallowing      Basic self-care  mod I  Toileting  mod I   Bathroom Transfers mod I  Bowel/Bladder  manage bowel w mod I assist  Transfers  Mod I with LRAD  Locomotion  supervision assist ambulation with LRAD  Communication     Cognition     Pain  at or below level 4  Safety/Judgment  maintain safety w cues/reminders   Therapy Plan: PT Intensity: Minimum of 1-2 x/day ,45 to 90 minutes PT Frequency: 5 out of 7 days PT Duration Estimated Length of Stay: 10-14 days OT Intensity: Minimum of 1-2 x/day, 45 to 90 minutes OT Frequency: 5 out of 7 days OT Duration/Estimated Length of Stay: 7 to 9 days     Due to the current state of emergency, patients may not be receiving their 3-hours of Medicare-mandated therapy.   Team Interventions: Nursing Interventions Disease Management/Prevention, Medication Management, Discharge Planning, Bowel Management, Patient/Family Education  PT interventions Ambulation/gait training, Training and development officer, Cognitive  remediation/compensation, Disease management/prevention, Community reintegration, DME/adaptive equipment instruction, Discharge planning, Functional electrical stimulation, Functional mobility training, Patient/family education, Pain management, Neuromuscular re-education, Psychosocial support, Skin care/wound management, Splinting/orthotics, Therapeutic Exercise, Therapeutic Activities, Stair training, UE/LE Strength taining/ROM, UE/LE Coordination activities, Wheelchair propulsion/positioning  OT Interventions Training and development officer, Community reintegration, Disease mangement/prevention, Brewing technologist, Barrister's clerk education, Self Care/advanced ADL retraining, Therapeutic Exercise, UE/LE Coordination activities, Wheelchair propulsion/positioning, Discharge planning, DME/adaptive equipment instruction, Functional mobility training, Pain management, Psychosocial support, Skin care/wound managment, Therapeutic Activities, UE/LE Strength taining/ROM  SLP Interventions    TR Interventions    SW/CM Interventions Discharge Planning, Psychosocial Support, Patient/Family Education, Disease Management/Prevention   Barriers to Discharge MD  Medical stability  Nursing Lack of/limited family support, Home environment access/layout, Medication compliance 1 level 2ste w friends  PT Inaccessible home environment, Decreased caregiver support, Home environment access/layout, Lack of/limited family support, Insurance underwriter for SNF coverage, Weight, Medication compliance    OT      SLP      SW Insurance for SNF coverage, Lack of/limited family support, Decreased caregiver support, Tree surgeon     Team Discharge Planning: Destination: PT-Home ,OT- Home , SLP-  Projected Follow-up: PT-Home health PT, OT-  Outpatient OT, SLP-  Projected Equipment Needs: PT-To be determined, Rolling walker with 5" wheels, OT- To be determined, SLP-  Equipment Details: PT- , OT-  Patient/family  involved in discharge planning: PT- Patient,  OT-Patient, SLP-   MD ELOS: 7-10d Medical Rehab Prognosis:  Good Assessment: 65 year old Rh-male with  history of T2DM, HFrEF, HTN, OSA, headaches, morbid obesity, recent wisdom tooth extraction who was admitted on 03/02/2021 with reports of persistent HA, dizziness, and difficulty walking with tendency to fall to the left.  History taken from patient and chart review.  UDS positive for opiates. CTA head done revealing multifocal moderate/severe stenosis of right PCA, severe stenosis proximal L-ECA, incidental 3.6 cm right thyroid nodule and was negative for LVO.  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  Echocardiogram with ejection fraction of 50%, trivial MVR and TVR, no wall abnormality and low normal LVF. 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 Erlinda Gardner recommended DAPT X 3 weeks followed by ASA alone as well control of risk factors. He has been followed by teaching service and thyroid ultrasound done showing slight interval increase in complex cystic mass in midline of neck with recommendations for ENT referral and stable right thyroid nodule.     Hospital course has been complicated by labile blood pressures and home meds slowly resumed. Patient with resultant dizziness, tremors with Left knee instability, weakness left hand?, shuffling/scissoring gait and balance deficits affecting mobility and ADLs. CIR recommended due to functional decline    See Team Conference Notes for weekly updates to the plan of care

## 2021-03-10 LAB — GLUCOSE, CAPILLARY
Glucose-Capillary: 137 mg/dL — ABNORMAL HIGH (ref 70–99)
Glucose-Capillary: 218 mg/dL — ABNORMAL HIGH (ref 70–99)
Glucose-Capillary: 233 mg/dL — ABNORMAL HIGH (ref 70–99)
Glucose-Capillary: 245 mg/dL — ABNORMAL HIGH (ref 70–99)

## 2021-03-10 MED ORDER — INSULIN GLARGINE-YFGN 100 UNIT/ML ~~LOC~~ SOLN
16.0000 [IU] | Freq: Two times a day (BID) | SUBCUTANEOUS | Status: DC
  Administered 2021-03-11 – 2021-03-16 (×11): 16 [IU] via SUBCUTANEOUS
  Filled 2021-03-10 (×13): qty 0.16

## 2021-03-10 NOTE — Progress Notes (Signed)
Physical Therapy Session Note  Patient Details  Name: Thomas Gardner MRN: [id number] Date of Birth: 13-Jan-1956  Today's Date: 03/10/2021 PT Individual Time: 0858-1001 and 1101-1140 PT Individual Time Calculation (min): 61 min and 39 min   Short Term Goals: Week 1:  PT Short Term Goal 1 (Week 1): Pt will Propell WC 156ft with supevisoin assist PT Short Term Goal 2 (Week 1): Pt will trasnfer to and from [facility] with CGA and LRAD PT Short Term Goal 3 (Week 1): Pt will ambulate 180ft with min A. PT Short Term Goal 4 (Week 1): Pt will ascend 1 step with RW and min assist   Skilled Therapeutic Interventions/Progress Updates:  Session 1: Patient seated upright in recliner on entrance to room. Patient alert and agreeable to PT session, though also states that he is tired and did not get rest last night.   Patient with no pain complaint throughout session.  Therapeutic Activity: Transfers: Patient performed sit<>stand and stand pivot transfers throughout session with CGA/ supervision. He states that he has been working on his transfers with everyone who assists him. Is able to verbally relate correct steps for self cues throughout. Quality of movement varies slightly with pause/ catch in rise to stand d/t ataxic RLE/ trunk movement.  Neuromuscular Re-ed: NMR facilitated during session with focus on standing balance, muscle activation, muscle fatigue and relation to ataxia, simple dual task. Pt guided in bean bag toss to target. Using RW to steady self, pt chooses bean bag with L hand and passes to R hand with toss from RUE to target. Progressed to pt holding beanbag in L hand in front of him and bringing R hand up to take beanbag from L prior to bringing L hand back to grip on RW.  Progressed to standing with 1# weighted ball with BUE grip on ball. Progressed pt in 5 gradually increasing in diameter "pot stirrers" in CW, then CCW directions. 5 reps in each direction with pt increasing in intensity of  trunk ataxia. Questioned pt re: need to sit with pt stating desire to push through. Following activity, pt educated on need for energy conservation in order to keep ataxia minimal. Relaxation will also help to reduce intensity of movements as increased effort and focus on a task can tend to increase muscle activity and fatigue which decreases the ability to control the movement. Pt relates understanding. NMR performed for improvements in motor control and coordination, balance, sequencing, judgement, and self confidence/ efficacy in performing all aspects of mobility at highest level of independence.   Patient seated  in recliner at end of session with brakes locked, seat alarm set, and all needs within reach.    Session 2: Patient seated upright in recliner on entrance to room. Patient alert and agreeable to PT session.   Patient with no pain complaint throughout session.  Therapeutic Activity: Transfers: Patient performed sit<>stand and stand pivot transfers throughout session with CGA/ supervision. Provided minimal verbal cues for technique.  Gait Training:  Focus during GT on ambulating shorter distances to reduce amounts of fatigue and ataxia. Patient ambulated 20' x1/ 30' x1/ 20' x1 ft using RW with CGA. Demonstrated intermittent reduced step length with LLE and pt providing self cues to correct. Provided vc/ tc for upright posture, reducing downward pressure into RW.  Neuromuscular Re-ed: NMR facilitated during session with focus on improving sit <> stand performance with reduced UE assist. Pt guided in sit <>stand to/from chairs with no armrests. Requires MinA throughout with brief  pause noted in middle of rise to stand and trunkal ataxic movements during completion of rise to stand. During eccentric return, pt is able to hold positions in different heights with no ataxia noted. NMR performed for improvements in motor control and coordination, balance, sequencing, judgement, and self  confidence/ efficacy in performing all aspects of mobility at highest level of independence.   Patient seated  in recliner at end of session with brakes locked, seat alarm set, and all needs within reach. Water provided on request.    Therapy Documentation Precautions:  Precautions Precautions: Fall Precaution Comments: tremors/shakiness, mild L hemi Restrictions Weight Bearing Restrictions: No  Pain:  No pain complaint throughout session.   Therapy/Group: Individual Therapy  Alger Simons PT, DPT 03/10/2021, 12:42 PM

## 2021-03-10 NOTE — Progress Notes (Signed)
Occupational Therapy Session Note  Patient Details  Name: Thomas Gardner MRN: [id number] Date of Birth: Nov 21, 1955  Today's Date: 03/10/2021 OT Individual Time: 0700-0753 OT Individual Time Calculation (min): 53 min + 23 min   Short Term Goals: Week 1:  OT Short Term Goal 1 (Week 1): STG  = LTG 2/2 ELOS  Skilled Therapeutic Interventions/Progress Updates:    Session 1 ([id number]): Pt received asleep in bed, easily awakened to voice and denies pain, agreeable to therapy. Session focus on self-care retraining, activity tolerance, BUE/BLE strengthening, return to work/energy conservation education in prep for improved ADL/IADL/func mobility performance + decreased caregiver burden. Completed bed mobility with mod I (use of bed rail/increased time). Sit to stand and ambulated to toilet with RW and CGA due to mild RLE tremor. Completed toilet transfer/toileting tasks with distant S post continent void of bladder. Ambulatory transfer back to bed and pt complete UB dressing with independence (assist to gather clothes) and LBD with CGA. Amb to sink to complete grooming tasks in standing with CGA and w/c follow due to increasing B knee instability.   Completed 8 continuous minutes on nustep to target cardiovascular endurance + BUE/BLE strengthening at level 1 resistance and an average of 15 steps/min. Pt reports having completed 6 min yesterday on nustep. Reports 13/20 on RPE post activity. Emphasized importance of self-monitoring for fatigue and introducing rest breaks PRN upon returning to daily routine. Engaged in conversation about required work tasks, pt reports employer will assist with necessary accommodations (remaining seated at work and having students present work to him, etc.) Reviewed ergonomic work stations/desks/chairs that he may have access to, as well for reasonable accommodations. Rates bimanual skills necessary for work at 75% compared to prior to stroke (100%), primarily concerned about  BLE strength.   Completed ambulatory back to recliner ~30 feet with CGA and w/c follow due to increasing BLE fatigue/knee instability. One episode of LLE buckling, but able to self correct.  Pt left in recliner with call bell in reach, and all immediate needs met.    Session 2 [phone]): Pt received seated in recliner, reports HA, but, agreeable to therapy. Session focus on activity tolerance, BUE strengthening, dynamic standing balance in prep for improved ADL/IADL/func mobility performance + decreased caregiver burden. Short distance ambulatory transfer to and from w/c with RW + CGA due to L sided ataxia. Pt able to ambulate with CGA + RW + clsoe w/c follow in between 3 tables/chairs in busy community environment to target dynamic standing balance/community reintegration. Required increased time to complete and 1 standing rest break half way through. Able to self-propel w/c all the way back to room with close S and increased time to target BUE strengthening and LUE NMR.   Pt left seated in recliner with call bell in reach, and all immediate needs met.    Therapy Documentation Precautions:  Precautions Precautions: Fall Precaution Comments: tremors/shakiness, mild L hemi Restrictions Weight Bearing Restrictions: No  Pain: see session notes   ADL: See Care Tool for more details.   Therapy/Group: Individual Therapy  Volanda Napoleon MS, OTR/L  03/10/2021, 6:38 AM

## 2021-03-10 NOTE — Progress Notes (Signed)
PROGRESS NOTE   Subjective/Complaints: No new complaints Feeling tired Seen working with therapy earlier today  Needs to use bathroom now  ROS- neg CP, SOB, N/V/D, +fatigue  Objective:   No results found. Recent Labs    03/08/21 0613  WBC 8.5  HGB 13.7  HCT 42.1  PLT 217   Recent Labs    03/08/21 0613  NA 137  K 3.7  CL 101  CO2 26  GLUCOSE 179*  BUN 29*  CREATININE 1.38*  CALCIUM 9.1    Intake/Output Summary (Last 24 hours) at 03/10/2021 1842 Last data filed at 03/10/2021 1811 Gross per 24 hour  Intake 1020 ml  Output --  Net 1020 ml        Physical Exam: Vital Signs Blood pressure 121/60, pulse 73, temperature 97.6 F (36.4 C), temperature source Oral, resp. rate 18, height 5\' 6"  (1.676 m), weight 112.9 kg, SpO2 97 %.  Gen: no distress, normal appearing HEENT: oral mucosa pink and moist, NCAT Cardio: Reg rate Chest: normal effort, normal rate of breathing Abd: soft, non-distended Ext: no edema Psych: pleasant, normal affect Skin: intact   Neurologic: Cranial nerves II through XII intact, motor strength is 5/5 in bilateral deltoid, bicep, tricep, grip, hip flexor, knee extensors, ankle dorsiflexor and plantar flexor Sensory exam normal sensation to light touch and proprioception in bilateral upper and lower extremities Cerebellar exam normal finger to nose to finger  in RIght upper , mild dysmetria , left upper  Musculoskeletal: Full range of motion in all 4 extremities. No joint swelling    Assessment/Plan: 1. Functional deficits which require 3+ hours per day of interdisciplinary therapy in a comprehensive inpatient rehab setting. Physiatrist is providing close team supervision and 24 hour management of active medical problems listed below. Physiatrist and rehab team continue to assess barriers to discharge/monitor patient progress toward functional and medical goals  Care  Tool:  Bathing    Body parts bathed by patient: Right arm, Left arm, Chest, Abdomen, Face, Front perineal area, Right upper leg, Left upper leg, Buttocks, Right lower leg, Left lower leg   Body parts bathed by helper: Buttocks, Left lower leg, Right lower leg     Bathing assist Assist Level: Supervision/Verbal cueing     Upper Body Dressing/Undressing Upper body dressing   What is the patient wearing?: Pull over shirt    Upper body assist Assist Level: Independent    Lower Body Dressing/Undressing Lower body dressing      What is the patient wearing?: Pants     Lower body assist Assist for lower body dressing: Contact Guard/Touching assist     Toileting Toileting    Toileting assist Assist for toileting: Contact Guard/Touching assist     Transfers Chair/bed transfer  Transfers assist     Chair/bed transfer assist level: Contact Guard/Touching assist     Locomotion Ambulation   Ambulation assist      Assist level: Moderate Assistance - Patient 50 - 74% Assistive device: Walker-rolling Max distance: 28ft   Walk 10 feet activity   Assist     Assist level: Moderate Assistance - Patient - 50 - 74% Assistive device: Hand held assist  Walk 50 feet activity   Assist Walk 50 feet with 2 turns activity did not occur: Safety/medical concerns         Walk 150 feet activity   Assist Walk 150 feet activity did not occur: Safety/medical concerns         Walk 10 feet on uneven surface  activity   Assist Walk 10 feet on uneven surfaces activity did not occur: Safety/medical concerns         Wheelchair     Assist Is the patient using a wheelchair?: Yes Type of Wheelchair: Manual    Wheelchair assist level: Minimal Assistance - Patient > 75% Max wheelchair distance: 100    Wheelchair 50 feet with 2 turns activity    Assist        Assist Level: Minimal Assistance - Patient > 75%   Wheelchair 150 feet activity      Assist      Assist Level: Moderate Assistance - Patient 50 - 74%   Blood pressure 121/60, pulse 73, temperature 97.6 F (36.4 C), temperature source Oral, resp. rate 18, height 5\' 6"  (1.676 m), weight 112.9 kg, SpO2 97 %.  Medical Problem List and Plan: 1. 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             -patient may shower             -ELOS/Goals: 7-10 days/supervision/mod I             Continue CIR 2.  Impaired mobility, ambulating 65 feet: continue Lovenox             -antiplatelet therapy: DAPT X 3 weeks followed by ASA alone.  3. Chronic HA/Pain Management: Continue tylenol prn.  4. Mood: LCSW to follow for evaluation and support.              -antipsychotic agents: N/a 5. Neuropsych: This patient is capable of making decisions on his own behalf. 6. Skin/Wound Care: Routine pressure relief measures.  7. Fluids/Electrolytes/Nutrition: Monitor I/Os 8. Chronic HFrEF/HTN:  On Losartan, Coreg, hygroton, Crestor and Jardiance.  -- Daily weights for signs of overload.              --has seen Dr. Einar Gip in the past.  9.T2DM: Hgb A1C- 9.9 and poorly controlled as he feels weak if BS <200.              --Continue Jardiance 25mg , d/c metformin (he has been refusing and does not plan on taking it at home), insulin glargine  as at home and novolog tid AC. Increase Semglee to 16U [facility] (last 3)  Recent Labs    03/10/21 0617 03/10/21 1146 03/10/21 1701  GLUCAP 137* 218* 233*   10.  Dyslipidemia: LDL-121. Was non-compliant with meds. Continue to educate on compliance.              --On Crestor and Zetia.  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 13. HTN: Now back on home regimen of Cozaar, hygroton, Coreg. --will order orthostatic vitals.   Vitals:   03/10/21 0408 03/10/21 1322  BP: (!) 142/83 121/60  Pulse: 75 73  Resp: 18 18  Temp: 98.6 F (37 C) 97.6 F  (36.4 C)  SpO2: 93% 97%   Controlled 10/21 14. Hypokalemia: Will add low dose supplement for recurrent hypokalemia (hygroton on board).  CMP ordered for tomorrow.K+ stable and normal  15. OSA: Machine broke/lack of insurance--will need sleep study after discharge.      LOS: 3 days A FACE TO FACE EVALUATION WAS PERFORMED  Izora Ribas 03/10/2021, 6:42 PM

## 2021-03-11 LAB — GLUCOSE, CAPILLARY
Glucose-Capillary: 161 mg/dL — ABNORMAL HIGH (ref 70–99)
Glucose-Capillary: 213 mg/dL — ABNORMAL HIGH (ref 70–99)
Glucose-Capillary: 143 mg/dL — ABNORMAL HIGH (ref 70–99)
Glucose-Capillary: 306 mg/dL — ABNORMAL HIGH (ref 70–99)

## 2021-03-11 MED ORDER — EMPAGLIFLOZIN 10 MG PO TABS: 10.0000 mg | ORAL_TABLET | Freq: Two times a day (BID) | ORAL | Status: DC

## 2021-03-11 MED ORDER — EMPAGLIFLOZIN 25 MG PO TABS
25.0000 mg | ORAL_TABLET | Freq: Every day | ORAL | Status: DC
  Administered 2021-03-11 – 2021-03-15 (×5): 25 mg via ORAL
  Filled 2021-03-11 (×6): qty 1

## 2021-03-11 NOTE — Progress Notes (Signed)
PROGRESS NOTE   Subjective/Complaints: Complains of dizziness, shaking, feeling the room spinning after taking his morning medications. Has since resolved. He feels he takes too make medications in the morning. Discussed moving Jiardiance to night and requests it be split so he gets half in morning and half at night  ROS- neg CP, SOB, N/V/D, +fatigue, +vertigo after taking morning medications  Objective:   No results found. No results for input(s): WBC, HGB, HCT, PLT in the last 72 hours.  No results for input(s): NA, K, CL, CO2, GLUCOSE, BUN, CREATININE, CALCIUM in the last 72 hours.   Intake/Output Summary (Last 24 hours) at 03/11/2021 1251 Last data filed at 03/11/2021 0807 Gross per 24 hour  Intake 840 ml  Output --  Net 840 ml        Physical Exam: Vital Signs Blood pressure (!) 156/79, pulse 74, temperature 98.7 F (37.1 C), temperature source Oral, resp. rate 18, height 5\' 6"  (1.676 m), weight 121.6 kg, SpO2 98 %.  Gen: no distress, normal appearing HEENT: oral mucosa pink and moist, NCAT Cardio: Reg rate Chest: normal effort, normal rate of breathing Abd: soft, non-distended Ext: no edema Psych: pleasant, normal affect Skin: intact   Neurologic: Cranial nerves II through XII intact, motor strength is 5/5 in bilateral deltoid, bicep, tricep, grip, hip flexor, knee extensors, ankle dorsiflexor and plantar flexor Sensory exam normal sensation to light touch and proprioception in bilateral upper and lower extremities Cerebellar exam normal finger to nose to finger  in RIght upper , mild dysmetria , left upper  Musculoskeletal: Full range of motion in all 4 extremities. No joint swelling    Assessment/Plan: 1. Functional deficits which require 3+ hours per day of interdisciplinary therapy in a comprehensive inpatient rehab setting. Physiatrist is providing close team supervision and 24 hour management of  active medical problems listed below. Physiatrist and rehab team continue to assess barriers to discharge/monitor patient progress toward functional and medical goals  Care Tool:  Bathing    Body parts bathed by patient: Right arm, Left arm, Chest, Abdomen, Face, Front perineal area, Right upper leg, Left upper leg, Buttocks, Right lower leg, Left lower leg   Body parts bathed by helper: Buttocks, Left lower leg, Right lower leg     Bathing assist Assist Level: Supervision/Verbal cueing     Upper Body Dressing/Undressing Upper body dressing   What is the patient wearing?: Pull over shirt    Upper body assist Assist Level: Independent    Lower Body Dressing/Undressing Lower body dressing      What is the patient wearing?: Pants     Lower body assist Assist for lower body dressing: Contact Guard/Touching assist     Toileting Toileting    Toileting assist Assist for toileting: Contact Guard/Touching assist     Transfers Chair/bed transfer  Transfers assist     Chair/bed transfer assist level: Contact Guard/Touching assist     Locomotion Ambulation   Ambulation assist      Assist level: Moderate Assistance - Patient 50 - 74% Assistive device: Walker-rolling Max distance: 69ft   Walk 10 feet activity   Assist     Assist level: Moderate Assistance -  Patient - 50 - 74% Assistive device: Hand held assist   Walk 50 feet activity   Assist Walk 50 feet with 2 turns activity did not occur: Safety/medical concerns         Walk 150 feet activity   Assist Walk 150 feet activity did not occur: Safety/medical concerns         Walk 10 feet on uneven surface  activity   Assist Walk 10 feet on uneven surfaces activity did not occur: Safety/medical concerns         Wheelchair     Assist Is the patient using a wheelchair?: Yes Type of Wheelchair: Manual    Wheelchair assist level: Minimal Assistance - Patient > 75% Max wheelchair  distance: 100    Wheelchair 50 feet with 2 turns activity    Assist        Assist Level: Minimal Assistance - Patient > 75%   Wheelchair 150 feet activity     Assist      Assist Level: Moderate Assistance - Patient 50 - 74%   Blood pressure (!) 156/79, pulse 74, temperature 98.7 F (37.1 C), temperature source Oral, resp. rate 18, height 5\' 6"  (1.676 m), weight 121.6 kg, SpO2 98 %.  Medical Problem List and Plan: 1. 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             -patient may shower             -ELOS/Goals: 7-10 days/supervision/mod I             Continue CIR 2.  Impaired mobility, ambulating 65 feet: continue Lovenox             -antiplatelet therapy: DAPT X 3 weeks followed by ASA alone.  3. Chronic HA/Pain Management: Continue tylenol prn.  4. Mood: LCSW to follow for evaluation and support.              -antipsychotic agents: N/a 5. Neuropsych: This patient is capable of making decisions on his own behalf. 6. Skin/Wound Care: Routine pressure relief measures.  7. Fluids/Electrolytes/Nutrition: Monitor I/Os 8. Chronic HFrEF/HTN:  On Losartan, Coreg, hygroton, Crestor and Jardiance.  -- Daily weights for signs of overload.              --has seen Dr. Einar Gip in the past.  9.T2DM: Hgb A1C- 9.9 and poorly controlled as he feels weak if BS <200.              --Changed Jardiance to 10mg  BID given side effect of vertigo. d/c metformin (he has been refusing and does not plan on taking it at home), insulin glargine  as at home and novolog tid AC. Increase Semglee to 16U. Patient has brought Victoza from home and it is being approved by pharmacy [facility] (last 3)  Recent Labs    03/10/21 2106 03/11/21 0604 03/11/21 1140  GLUCAP 245* 161* 213*   10.  Dyslipidemia: LDL-121. Was non-compliant with meds. Continue to educate on compliance.              --On  Crestor and Zetia.  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 13. HTN: Now back on home regimen of Cozaar, hygroton, Coreg. --will order orthostatic vitals.  -discussed that Coreg can also cause dizziness. Recommended consuming nuts and citrus fruits to help lower blood pressure and lessen need for medication  Vitals:   03/10/21 1939 03/11/21 0447  BP: (!) 122/59 (!) 156/79  Pulse: 77 74  Resp:  18  Temp: 98 F (36.7 C) 98.7 F (37.1 C)  SpO2: 98% 98%   Controlled 10/21 14. Hypokalemia: Will add low dose supplement for recurrent hypokalemia (hygroton on board).              CMP ordered for tomorrow.K+ stable and normal  15. OSA: Machine broke/lack of insurance--will need sleep study after discharge.  15. Vertigo: likely secondary to Jardiance and Coreg- patient educated and timing of medications adjusted.      LOS: 4 days A FACE TO FACE EVALUATION WAS PERFORMED  Clide Deutscher Denisa Enterline 03/11/2021, 12:51 PM

## 2021-03-12 LAB — BASIC METABOLIC PANEL
Anion gap: 8 (ref 5–15)
BUN: 23 mg/dL (ref 8–23)
CO2: 27 mmol/L (ref 22–32)
Calcium: 9.1 mg/dL (ref 8.9–10.3)
Chloride: 101 mmol/L (ref 98–111)
Creatinine, Ser: 1.29 mg/dL — ABNORMAL HIGH (ref 0.61–1.24)
GFR, Estimated: >60 mL/min (ref >60)
Glucose, Bld: 153 mg/dL — ABNORMAL HIGH (ref 70–99)
Potassium: 4 mmol/L (ref 3.5–5.1)
Sodium: 136 mmol/L (ref 135–145)

## 2021-03-12 LAB — GLUCOSE, CAPILLARY
Glucose-Capillary: 165 mg/dL — ABNORMAL HIGH (ref 70–99)
Glucose-Capillary: 201 mg/dL — ABNORMAL HIGH (ref 70–99)
Glucose-Capillary: 171 mg/dL — ABNORMAL HIGH (ref 70–99)
Glucose-Capillary: 322 mg/dL — ABNORMAL HIGH (ref 70–99)

## 2021-03-12 LAB — CBC
HCT: 41.5 % (ref 39.0–52.0)
Hemoglobin: 13.4 g/dL (ref 13.0–17.0)
MCH: 26.2 pg (ref 26.0–34.0)
MCHC: 32.3 g/dL (ref 30.0–36.0)
MCV: 81.1 fL (ref 80.0–100.0)
Platelets: 205 10*3/uL (ref 150–400)
RBC: 5.12 MIL/uL (ref 4.22–5.81)
RDW: 14.2 % (ref 11.5–15.5)
WBC: 9.6 10*3/uL (ref 4.0–10.5)
nRBC: 0 % (ref 0.0–0.2)

## 2021-03-12 NOTE — Progress Notes (Signed)
Physical Therapy Session Note  Patient Details  Name: Thomas Gardner MRN: [id number] Date of Birth: 10-13-1955  Today's Date: 03/12/2021 PT Individual Time: 0915-1000 PT Individual Time Calculation (min): 45 min   Short Term Goals: Week 1:  PT Short Term Goal 1 (Week 1): Pt will Propell WC 142ft with supevisoin assist PT Short Term Goal 2 (Week 1): Pt will trasnfer to and from [facility] with CGA and LRAD PT Short Term Goal 3 (Week 1): Pt will ambulate 139ft with min A. PT Short Term Goal 4 (Week 1): Pt will ascend 1 step with RW and min assist Week 2:     Skilled Therapeutic Interventions/Progress Updates:  Patient seated in recliner on entrance to room. Patient alert and agreeable to PT session.   Patient with no pain complaint throughout session.  Therapeutic Activity: Transfers: Patient performed sit<>stand and stand pivot transfers throughout session with close supervision. Provided verbal cues for slight increase to forward lean initially and pt improving throughout rest of session.  Gait Training/ NMR:  Patient ambulated >150 ft around day room retrieving 10 small weighted balls from differing heights and distances using RW with CGA. Pt observed for quality of gait, safe positioning and retrieval of weighted balls, and ability to stop/rest when fatigued. Quality of gait is improving with pt continuing to verbalize cues that have been provided to him but therapists to improve safe quality of gait. Pt observes and notes light L ankle inversion with LLE advancement. Pt completes collection of all 10 balls with increasing fatigue after 7/ 10. With verbal cues to sit, pt continues to want to push through despite education to contrary. Pt was able to demo more advanced stepping patterns such as lateral stepping, backward stepping, and managing walker with stepping over low height obstacles with no cueing involved to perform.    NMR performed for improvements in motor control and coordination,  balance, sequencing, judgement, and self confidence/ efficacy in performing all aspects of mobility at highest level of independence.   At end of session, back in room, pt educated in ankle inversion/ eversion and guided in performance in room of ankle eversion in order to strengthen peroneals for increased control of ankle eversion during gait and reduce potential for ankle sprain .  Patient seated  in w/c at end of session with brakes locked, belt alarm set, and all needs within reach.     Therapy Documentation Precautions:  Precautions Precautions: Fall Precaution Comments: tremors/shakiness, mild L hemi Restrictions Weight Bearing Restrictions: No  Therapy/Group: Individual Therapy  Alger Simons PT, DPT 03/12/2021, 10:30 AM

## 2021-03-12 NOTE — Progress Notes (Signed)
Occupational Therapy Session Note  Patient Details  Name: Thomas DAUGHTRIDGE MRN: [id number] Date of Birth: November 16, 1955  Today's Date: 03/12/2021 OT Individual Time: 0800-0910 OT Individual Time Calculation (min): 70 min session 1 Session 2: 1100-1154 ( 54 mins) Session 3: 1400-1428 ( 28 mins)   Short Term Goals: Week 1:  OT Short Term Goal 1 (Week 1): STG  = LTG 2/2 ELOS  Skilled Therapeutic Interventions/Progress Updates:  Session 1: Pt greeted supine in bed  agreeable to OT intervention, pt reports no pain . Session focus on BADL reeducation and household distance functional mobility . Pt completed bed mobility MOD I with HOB elevated to 48 degrees with use of bed rails. Pt with clothes already laid out on bed side chair, CGA for sit<>stand with RW with pt transporting clothes into bathroom on Rw. CGA for functional ambulation into BR for toileting. Pt completed 3/3 toileting tasks MOD I.  Pt completed ambulatory shower transfer to TTB in walkin shower with CGA. Pt doffed clothes from shower seat MOD I. Pt completed bathing from TTB MOD I  with pt able to stand from TTB with use of grab bar. Pt dressed from TTB MOD I ( shirt and underwear), pt ambulated from shower to EOB with Rw with CGA to complete LB dressing from EOB MOD I. Pt ambulated to sink with CGA where pt stood for grooming tasks with unilateral support on sink. Pt required seated restbreak post grooming tasks, MAX A to don shoes. Pt transported to gym with total A for time mgmt. Pt completed standing therapeutic activity with a focus on improved heel/strike on LLE to decrease risk of falls at home. Pt sit<>stand from w/c with Rw with CGA with pt instructed to step forward<>backward with LLE onto target with an emphasis on improved hip flexion and improved heel/toe strike. Pt completed TA with CGA for 2 mins before needing to sit. Pt completed functional mobility back to room with rw and CGA ~ 50 ft before reporting fatigue needing to sit.  Pt transported remainder of distance for energy conservation.   pt left seated in w/c with alarm belt activated and all needs within reach.                      Session 2: 1100-1154 ( 54 mins): pt greeted seated in recliner agreeable to OT intervention. Pt reports unrated pain in knees, provided rest breaks as needed as pain mgmt strategy.  Session focus on IADL tasks in kitchen and functional mobility in ADL apt. Pt reports need to void bladder at start of session with pt completing ambulatory toilet transfer with rw and CGA, MOD I for 3/3 toileting tasks. Pt ambulated out of bathroom to w/c with rw and CGA. Pt transported to apt with total A for time mgmt. Discussed set- up of friends kitchen at home with pt reporting nothing is low and everything is hip height or above. Eduction provided on general safety instructions for kitchen mobility with pt verbalizing understanding. Pt able to ambulate in kitchen with CGA with RW to retrieve items in [facility] cabinet and into refrigerator to simulate light meal prep. Issued pt walker bag to assist with transporting items. Pt additionally completed functional mobility with RW to low recliner and low couch with pt able to sit<>stand from low/compliant surface with CGA with rw. Transported pt back to room with total A, issued pt handout on all energy conservation to increase carryover. Pt completed stand pivot from w/c >recliner  with rw and CGA. Pt left seated in recliner with all needs within reach and chair alarm activated.   Session 3: 1400-1428 ( 28 mins): pt greeted asleep in recliner but easily able to arouse and agreeable to OT intervention. No pain reported during session. Session primarily focus on instruction on  BUE therex to increase strength and overall activity tolerance for higher level functional mobility. Pt reports a need to void b/b at start of session with pt completing ambulatory toilet transfer with rw and CGA. Pt completed 3/3 toileting tasks MOD I and  able to ambulate back to recliner with rw and CGA. Pt completed below therex with level 4 theraband from sitting in recliner: X10 shoulder flexion X10 bicep curls X10 chest pulls X10 tricep rows X10 punches from chest  Issued pt HEP to increase carryover. Pt left seated in recliner with all needs within reach.   Therapy Documentation Precautions:  Precautions Precautions: Fall Precaution Comments: tremors/shakiness, mild L hemi Restrictions Weight Bearing Restrictions: No      Therapy/Group: Individual Therapy  [facility] 03/12/2021, 9:12 AM

## 2021-03-12 NOTE — Progress Notes (Signed)
PROGRESS NOTE   Subjective/Complaints:   ROS- neg CP, SOB, N/V/D, +fatigue, +vertigo after taking morning medications  Objective:   No results found. Recent Labs    03/12/21 0647  WBC 9.6  HGB 13.4  HCT 41.5  PLT 205    Recent Labs    03/12/21 0647  NA 136  K 4.0  CL 101  CO2 27  GLUCOSE 153*  BUN 23  CREATININE 1.29*  CALCIUM 9.1     Intake/Output Summary (Last 24 hours) at 03/12/2021 0809 Last data filed at 03/11/2021 1830 Gross per 24 hour  Intake 600 ml  Output --  Net 600 ml         Physical Exam: Vital Signs Blood pressure (!) 153/71, pulse 74, temperature 98.3 F (36.8 C), resp. rate 16, height 5\' 6"  (1.676 m), weight 113.5 kg, SpO2 97 %.  General: No acute distress Mood and affect are appropriate Heart: Regular rate and rhythm no rubs murmurs or extra sounds Lungs: Clear to auscultation, breathing unlabored, no rales or wheezes Abdomen: Positive bowel sounds, soft nontender to palpation, nondistended Extremities: No clubbing, cyanosis, or edema Skin: No evidence of breakdown, no evidence of rash   Neurologic: Cranial nerves II through XII intact, motor strength is 5/5 in bilateral deltoid, bicep, tricep, grip, hip flexor, knee extensors, ankle dorsiflexor and plantar flexor Sensory exam normal sensation to light touch and proprioception in bilateral upper and lower extremities Cerebellar exam normal finger to nose to finger  in RIght upper , mild dysmetria , left upper  Musculoskeletal: Full range of motion in all 4 extremities. No joint swelling    Assessment/Plan: 1. Functional deficits which require 3+ hours per day of interdisciplinary therapy in a comprehensive inpatient rehab setting. Physiatrist is providing close team supervision and 24 hour management of active medical problems listed below. Physiatrist and rehab team continue to assess barriers to discharge/monitor patient  progress toward functional and medical goals  Care Tool:  Bathing    Body parts bathed by patient: Right arm, Left arm, Chest, Abdomen, Face, Front perineal area, Right upper leg, Left upper leg, Buttocks, Right lower leg, Left lower leg   Body parts bathed by helper: Buttocks, Left lower leg, Right lower leg     Bathing assist Assist Level: Supervision/Verbal cueing     Upper Body Dressing/Undressing Upper body dressing   What is the patient wearing?: Pull over shirt    Upper body assist Assist Level: Independent    Lower Body Dressing/Undressing Lower body dressing      What is the patient wearing?: Pants     Lower body assist Assist for lower body dressing: Contact Guard/Touching assist     Toileting Toileting    Toileting assist Assist for toileting: Contact Guard/Touching assist     Transfers Chair/bed transfer  Transfers assist     Chair/bed transfer assist level: Contact Guard/Touching assist     Locomotion Ambulation   Ambulation assist      Assist level: Moderate Assistance - Patient 50 - 74% Assistive device: Walker-rolling Max distance: 56ft   Walk 10 feet activity   Assist     Assist level: Moderate Assistance - Patient -  50 - 74% Assistive device: Hand held assist   Walk 50 feet activity   Assist Walk 50 feet with 2 turns activity did not occur: Safety/medical concerns         Walk 150 feet activity   Assist Walk 150 feet activity did not occur: Safety/medical concerns         Walk 10 feet on uneven surface  activity   Assist Walk 10 feet on uneven surfaces activity did not occur: Safety/medical concerns         Wheelchair     Assist Is the patient using a wheelchair?: Yes Type of Wheelchair: Manual    Wheelchair assist level: Minimal Assistance - Patient > 75% Max wheelchair distance: 100    Wheelchair 50 feet with 2 turns activity    Assist        Assist Level: Minimal Assistance -  Patient > 75%   Wheelchair 150 feet activity     Assist      Assist Level: Moderate Assistance - Patient 50 - 74%   Blood pressure (!) 153/71, pulse 74, temperature 98.3 F (36.8 C), resp. rate 16, height 5\' 6"  (1.676 m), weight 113.5 kg, SpO2 97 %.  Medical Problem List and Plan: 1. 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             -patient may shower             -ELOS/Goals: 7-10 days/supervision/mod I             Continue CIR 2.  Impaired mobility, ambulating 65 feet: continue Lovenox             -antiplatelet therapy: DAPT X 3 weeks followed by ASA alone.  3. Chronic HA/Pain Management: Continue tylenol prn.  4. Mood: LCSW to follow for evaluation and support.              -antipsychotic agents: N/a 5. Neuropsych: This patient is capable of making decisions on his own behalf. 6. Skin/Wound Care: Routine pressure relief measures.  7. Fluids/Electrolytes/Nutrition: Monitor I/Os 8. Chronic HFrEF/HTN:  On Losartan, Coreg, hygroton, Crestor and Jardiance.  -- Daily weights for signs of overload.              --has seen Dr. Einar Gip in the past.  9.T2DM: Hgb A1C- 9.9 and poorly controlled as he feels weak if BS <200.              --Changed Jardiance to 10mg  BID given side effect of vertigo. d/c metformin (he has been refusing and does not plan on taking it at home), insulin glargine  as at home and novolog tid AC. Increase Semglee to 16U. Patient has brought Victoza from home and it is being approved by pharmacy [facility] (last 3)  Recent Labs    03/11/21 1640 03/11/21 2054 03/12/21 0538  GLUCAP 143* 306* 165*   Elevated yesterday PM- pt drank cranberry juice but thought it was diet, cont to monitor  10.  Dyslipidemia: LDL-121. Was non-compliant with meds. Continue to educate on compliance.              --On Crestor and Zetia.  11. Thyroid cyst: ENT referral  sent? 13. HTN: Now back on home regimen of Cozaar, hygroton, Coreg. --will order orthostatic vitals.  -discussed that Coreg can also cause dizziness. Recommended consuming nuts and citrus fruits to help lower blood pressure and lessen  need for medication  Vitals:   03/11/21 1957 03/12/21 0533  BP: (!) 142/65 (!) 153/71  Pulse: 79 74  Resp: 18 16  Temp: 98.4 F (36.9 C) 98.3 F (36.8 C)  SpO2: 99% 97%   Controlled 10/24 14. Hypokalemia: Will add low dose supplement for recurrent hypokalemia (hygroton on board).              CMP ordered for tomorrow.K+ stable and normal  15. OSA: Machine broke/lack of insurance--will need sleep study after discharge.  15. Vertigo: likely secondary to Jardiance and Coreg- patient educated and timing of medications adjusted.      LOS: 5 days A FACE TO FACE EVALUATION WAS PERFORMED  Charlett Blake 03/12/2021, 8:09 AM

## 2021-03-13 LAB — GLUCOSE, CAPILLARY
Glucose-Capillary: 184 mg/dL — ABNORMAL HIGH (ref 70–99)
Glucose-Capillary: 245 mg/dL — ABNORMAL HIGH (ref 70–99)
Glucose-Capillary: 191 mg/dL — ABNORMAL HIGH (ref 70–99)
Glucose-Capillary: 360 mg/dL — ABNORMAL HIGH (ref 70–99)

## 2021-03-13 MED ORDER — CHLORTHALIDONE 25 MG PO TABS
25.0000 mg | ORAL_TABLET | Freq: Every day | ORAL | Status: DC
  Administered 2021-03-14 – 2021-03-16 (×3): 25 mg via ORAL
  Filled 2021-03-13 (×4): qty 1

## 2021-03-13 MED ORDER — LIRAGLUTIDE 18 MG/3ML ~~LOC~~ SOPN
1.8000 mg | PEN_INJECTOR | Freq: Every day | SUBCUTANEOUS | Status: DC
  Filled 2021-03-13: qty 3

## 2021-03-13 NOTE — Progress Notes (Addendum)
PROGRESS NOTE   Subjective/Complaints:  No issues overnite , discussed BP and [facility]/Victoza  ROS- neg CP, SOB, N/V/D,   Objective:   No results found. Recent Labs    03/12/21 0647  WBC 9.6  HGB 13.4  HCT 41.5  PLT 205     Recent Labs    03/12/21 0647  NA 136  K 4.0  CL 101  CO2 27  GLUCOSE 153*  BUN 23  CREATININE 1.29*  CALCIUM 9.1      Intake/Output Summary (Last 24 hours) at 03/13/2021 0755 Last data filed at 03/12/2021 1259 Gross per 24 hour  Intake 480 ml  Output --  Net 480 ml         Physical Exam: Vital Signs Blood pressure (!) 175/90, pulse 77, temperature 98.2 F (36.8 C), resp. rate 20, height 5\' 6"  (1.676 m), weight 110.3 kg, SpO2 98 %.  General: No acute distress Mood and affect are appropriate Heart: Regular rate and rhythm no rubs murmurs or extra sounds Lungs: Clear to auscultation, breathing unlabored, no rales or wheezes Abdomen: Positive bowel sounds, soft nontender to palpation, nondistended Extremities: No clubbing, cyanosis, or edema Skin: No evidence of breakdown, no evidence of rash  Neurologic: Cranial nerves II through XII intact, motor strength is 5/5 in bilateral deltoid, bicep, tricep, grip, hip flexor, knee extensors, ankle dorsiflexor and plantar flexor Sensory exam normal sensation to light touch and proprioception in bilateral upper and lower extremities Cerebellar exam normal finger to nose to finger  in RIght upper , mild dysmetria , left upper  Musculoskeletal: Full range of motion in all 4 extremities. No joint swelling    Assessment/Plan: 1. Functional deficits which require 3+ hours per day of interdisciplinary therapy in a comprehensive inpatient rehab setting. Physiatrist is providing close team supervision and 24 hour management of active medical problems listed below. Physiatrist and rehab team continue to assess barriers to discharge/monitor patient  progress toward functional and medical goals  Care Tool:  Bathing    Body parts bathed by patient: Right arm, Left arm, Chest, Abdomen, Face, Front perineal area, Right upper leg, Left upper leg, Buttocks, Right lower leg, Left lower leg   Body parts bathed by helper: Buttocks, Left lower leg, Right lower leg     Bathing assist Assist Level: Independent with assistive device     Upper Body Dressing/Undressing Upper body dressing   What is the patient wearing?: Pull over shirt    Upper body assist Assist Level: Independent    Lower Body Dressing/Undressing Lower body dressing      What is the patient wearing?: Pants, Underwear/pull up     Lower body assist Assist for lower body dressing: Independent with assitive device     Toileting Toileting    Toileting assist Assist for toileting: Independent with assistive device     Transfers Chair/bed transfer  Transfers assist     Chair/bed transfer assist level: Contact Guard/Touching assist     Locomotion Ambulation   Ambulation assist      Assist level: Moderate Assistance - Patient 50 - 74% Assistive device: Walker-rolling Max distance: 22ft   Walk 10 feet activity   Assist  Assist level: Moderate Assistance - Patient - 50 - 74% Assistive device: Hand held assist   Walk 50 feet activity   Assist Walk 50 feet with 2 turns activity did not occur: Safety/medical concerns         Walk 150 feet activity   Assist Walk 150 feet activity did not occur: Safety/medical concerns         Walk 10 feet on uneven surface  activity   Assist Walk 10 feet on uneven surfaces activity did not occur: Safety/medical concerns         Wheelchair     Assist Is the patient using a wheelchair?: Yes Type of Wheelchair: Manual    Wheelchair assist level: Minimal Assistance - Patient > 75% Max wheelchair distance: 100    Wheelchair 50 feet with 2 turns activity    Assist         Assist Level: Minimal Assistance - Patient > 75%   Wheelchair 150 feet activity     Assist      Assist Level: Moderate Assistance - Patient 50 - 74%   Blood pressure (!) 175/90, pulse 77, temperature 98.2 F (36.8 C), resp. rate 20, height 5\' 6"  (1.676 m), weight 110.3 kg, SpO2 98 %.  Medical Problem List and Plan: 1. 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             -patient may shower             -ELOS/Goals: 7-10 days/supervision/mod I             Continue CIR PT, OT 2.  Impaired mobility, ambulating 65 feet: continue Lovenox             -antiplatelet therapy: DAPT X 3 weeks followed by ASA alone.  3. Chronic HA/Pain Management: Continue tylenol prn.  4. Mood: LCSW to follow for evaluation and support.              -antipsychotic agents: N/a 5. Neuropsych: This patient is capable of making decisions on his own behalf. 6. Skin/Wound Care: Routine pressure relief measures.  7. Fluids/Electrolytes/Nutrition: Monitor I/Os 8. Chronic HFrEF/HTN:  On Losartan, Coreg, hygroton, Crestor and Jardiance.  -- Daily weights for signs of overload.              --has seen Dr. Einar Gip in the past.  9.T2DM: Hgb A1C- 9.9 and poorly controlled as he feels weak if BS <200.              --Changed Jardiance to 10mg  BID given side effect of vertigo. d/c metformin (he has been refusing and does not plan on taking it at home), insulin glargine  as at home and novolog tid AC. Increase Semglee to 16U. Patient has brought Victoza from home and it is being approved by pharmacy [facility] (last 3)  Recent Labs    03/12/21 1634 03/12/21 2100 03/13/21 0640  GLUCAP 171* 322* 184*   Elevated yesterday PM- pt drank cranberry juice but thought it was diet, cont to monitor Increase semglee, start Victoza 1.8 mg sq q/day once oked by pharmacy   10.  Dyslipidemia: LDL-121. Was non-compliant  with meds. Continue to educate on compliance.              --On Crestor and Zetia at home currently just on Crestor.  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 13. HTN: Now back on home regimen of  Cozaar, hygroton, Coreg. --will order orthostatic vitals.   Vitals:   03/13/21 0639 03/13/21 0643  BP: (!) 176/91 (!) 175/90  Pulse: 77 77  Resp:    Temp: 98.2 F (36.8 C) 98.2 F (36.8 C)  SpO2: 99% 98%  Elevated will increase hygrotan to 25mg  (home dose) 14. Hypokalemia: Will add low dose supplement for recurrent hypokalemia (hygroton on board).              CMP ordered for tomorrow.K+ stable and normal  15. OSA: Machine broke/lack of insurance--will need sleep study after discharge.       LOS: 6 days A FACE TO FACE EVALUATION WAS PERFORMED  Charlett Blake 03/13/2021, 7:55 AM

## 2021-03-13 NOTE — Progress Notes (Signed)
Physical Therapy Session Note  Patient Details  Name: Thomas Gardner MRN: [id number] Date of Birth: 1955-07-20  Today's Date: 03/13/2021 PT Individual Time: 1117-1201 and 6720-9470 PT Individual Time Calculation (min): 44 min and 74 min   Short Term Goals: Week 1:  PT Short Term Goal 1 (Week 1): Pt will Propell WC 153ft with supevisoin assist PT Short Term Goal 2 (Week 1): Pt will trasnfer to and from [facility] with CGA and LRAD PT Short Term Goal 3 (Week 1): Pt will ambulate 132ft with min A. PT Short Term Goal 4 (Week 1): Pt will ascend 1 step with RW and min assist  Skilled Therapeutic Interventions/Progress Updates:  Session 1: Patient seated upright in recliner on entrance to room. Patient alert and agreeable to PT session. Relates need to toilet prior to starting session. Ambulatory transfer to bathroom at household distance under distant supervision and pt toilets at Mod I.   Patient with no pain complaint throughout session.  Therapeutic Activity: Transfers: Patient performed sit<>stand transfers with Mod I/ supervision and stand pivot transfers throughout session with supervision. W/c to mat table and w/c to recliner stand pivots performed with no RW and hand holds to armrests of seats with supervision and good balance. Provided verbal cues for technique.  Gait Training:  Patient ambulated short household distances within room using RW with distant supervision. Demonstrated slow, consistent pace with conscious step technique especially for LLE advancement. No vc required.   Neuromuscular Re-ed: NMR facilitated during session with focus on standing balance, relaxation techniques to decrease ataxia, balance strategies. Pt guided in stance on [employer] using RW and placement/ retrieval of horseshoes on/ from basketball hoop. Placed with LUE just outside of BOS after grasping horseshoe from PT's hand at differing heights and distances cross body. Increased distance for retrieval of horseshoes.  Retrieval with LUE and pass to RUE in front of pt requiring brief period of stance with no UE support. Guided pt in deep, abdominal breath to relax and center self for decreased ataxia and slow purposeful movement from one hand to other. No LOB throughout. Light ataxia in trunk with final handoff.   Pt also guided in step up to 4" step stepping up/ down leading with alternating LE. Completes x8 (4 leading with each LE) and RW. Good foot clearance bilaterally and good strength noted for ascent/ descent. Will need to progress to step training for upcoming potential d/c home with use of RW to platform 4" steps.   NMR performed for improvements in motor control and coordination, balance, sequencing, judgement, and self confidence/ efficacy in performing all aspects of mobility at highest level of independence.   Patient seated in recliner at end of session with brakes locked, seat alarm set, and all needs within reach.    Session 2: - outside amb up/ down ramped, uneven surface150' each direction, very slow pace, very intentional with steps - toilet transfer with Mod I - disc re: DME pt can acquire through church/ friends and availability of supervision at discharge location  Patient seated upright in recliner on entrance to room. Patient alert and agreeable to PT session.   Patient with no pain complaint throughout session.  Therapeutic Activity: Transfers: Patient performed sit<>stand transfers with Mod I and stand pivot transfers throughout session with Mod I/ supervision. No vc for technique as pt cues himself.   Gait Training:  Patient ambulated 175' x2 up/ down uneven ramped surface using RW with CGA/ close supervision. Demonstrated very slow pace with intentional  LE advancement bilaterally. Provided vc prior to performance for controlling RW in descent and unweighting from walker on return in ascent of ramped surface. Good technique overall with need for very min cues.   Patient seated  in  recliner at end of session with brakes locked, seat alarm set, and all needs within reach.   Therapy Documentation Precautions:  Precautions Precautions: Fall Precaution Comments: tremors/shakiness, mild L hemi Restrictions Weight Bearing Restrictions: No General:   Vital Signs:  Pain:  No pain complaint throughout sessions.   Therapy/Group: Individual Therapy  Alger Simons PT, DPT 03/13/2021, 11:19 AM

## 2021-03-13 NOTE — Progress Notes (Signed)
Occupational Therapy Session Note  Patient Details  Name: Thomas Gardner MRN: [id number] Date of Birth: 12/29/1955  Today's Date: 03/13/2021 OT Individual Time: 3358-2518 OT Individual Time Calculation (min): 53 min + 43 min   Short Term Goals: Week 1:  OT Short Term Goal 1 (Week 1): STG  = LTG 2/2 ELOS  Skilled Therapeutic Interventions/Progress Updates:    Session 1 (9842-1031): Pt received semi-reclined in bed, denies pain, agreeable to therapy. Session focus on self-care retraining, activity tolerance, static/dynamic standing balance, functional transfers in prep for improved ADL/IADL/func mobility performance + decreased caregiver burden. Completed bed mobility and donned B socks mod I. Sit to stand and amb to and from toilet with RW + CGA due to mild RLE tremor. Completed toileting tasks and LB dressing with mod I (increased time, use of RW/grab bar). Amb to sink in similar manner and completed oral care and grooming tasks in standing with S to monitor fatigue level. Seated, donned B shoes with min A to untie/adjust heel in shoe. Pt reports being most concerned about his "balance" and ankle stability to prevent falls prior to DC, otherwise no concerns going home.   Completed 2 rounds of the following games each using Wii Fit Board to target ankle strategies/stability for balance and weight shift. Required BUE support on RW throughout games and min cues to facilitate WS.  -marble board  -penguin slide  Ambulated ~40 ft with CGA + RW due to fatigue and several instances of L ankle eversion. Short ambulatory back to recliner in same manner.  Pt left seated in recliner with call bell in reach, and all immediate needs met.    Session 2 [id number]): Pt received seated in recliner, denies pain, agreeable to therapy. Session focus on IADL retraining, activity tolerance, dynamic standing balance, energy conservation education in prep for improved ADL/IADL/func mobility performance + decreased  caregiver burden. Stand-pivot no AD with CGA and reliance on BUE support on arm rests to guide pelvis into seat. Total A w/c transport [facility] and [facility] for time management and energy conservation.   In gift shop, pt able to locate 5 "shopping list" items with CGA and RW, min Vcs to recall 1/5 items on list . Slow pace throughout activity.   Pt taken outside for psychosocial health/change of scenery, pt appreciative. Relates that his friend to whose home is he Dcing to may be interested in attending a family education session to prepare for DC.   Able ~50 ft around outdoor fountain on even surfaces with cues for pace/step length, encouragement to take standing rest breaks as needed. Pt reports post activity "I feel like I accomplished something."  Pt left seated in recliner with chair alarm engaged, call bell in reach, and all immediate needs met.    Therapy Documentation Precautions:  Precautions Precautions: Fall Precaution Comments: tremors/shakiness, mild L hemi Restrictions Weight Bearing Restrictions: No  Pain:  denies ADL: See Care Tool for more details.   Therapy/Group: Individual Therapy  Volanda Napoleon MS, OTR/L  03/13/2021, 6:38 AM

## 2021-03-13 NOTE — Progress Notes (Signed)
Inpatient Diabetes Program Recommendations  AACE/ADA: New Consensus Statement on Inpatient Glycemic Control (2015)  Target Ranges:  Prepandial:   less than 140 mg/dL      Peak postprandial:   less than 180 mg/dL (1-2 hours)      Critically ill patients:  140 - 180 mg/dL   Lab Results  Component Value Date   GLUCAP 245 (H) 03/13/2021   HGBA1C 9.9 (H) 03/03/2021    Review of Glycemic Control Results for ALBY, SCHWABE (MRN [id number]) as of 03/13/2021 13:47  Ref. Range 03/12/2021 16:34 03/12/2021 21:00 03/13/2021 06:40 03/13/2021 12:14  Glucose-Capillary Latest Ref Range: 70 - 99 mg/dL 171 (H) 322 (H) 184 (H) 245 (H)   Diabetes history: Type 2 DM Outpatient Diabetes medications: Jardiance 25 mg QD, Lantus 25 units BID, Victoza 1.8 mg QD, Humalog 18 units TID, Metformin 500 mg BID Current orders for Inpatient glycemic control: Novolog 0-15 units TID & HS, Semglee 16 units BID, Novolog 5 units TID  Inpatient Diabetes Program Recommendations:    Consider increasing Novolog 8 units TID (assuming patient is consuming >50% of meals).   Thanks, Bronson Curb, MSN, RNC-OB Diabetes Coordinator [phone] (8a-5p)

## 2021-03-14 ENCOUNTER — Encounter: Payer: Medicare Other | Admitting: Internal Medicine

## 2021-03-14 ENCOUNTER — Other Ambulatory Visit (HOSPITAL_COMMUNITY): Payer: Self-pay

## 2021-03-14 LAB — GLUCOSE, CAPILLARY
Glucose-Capillary: 166 mg/dL — ABNORMAL HIGH (ref 70–99)
Glucose-Capillary: 195 mg/dL — ABNORMAL HIGH (ref 70–99)
Glucose-Capillary: 194 mg/dL — ABNORMAL HIGH (ref 70–99)
Glucose-Capillary: 253 mg/dL — ABNORMAL HIGH (ref 70–99)

## 2021-03-14 MED ORDER — CHLORTHALIDONE 25 MG PO TABS
25.0000 mg | ORAL_TABLET | Freq: Every day | ORAL | 0 refills | Status: DC
  Filled 2021-03-14: qty 30, 30d supply, fill #0

## 2021-03-14 MED ORDER — VITAMIN C 500 MG PO TABS
500.0000 mg | ORAL_TABLET | Freq: Every day | ORAL | 0 refills | Status: DC
  Filled 2021-03-14: qty 30, 30d supply, fill #0

## 2021-03-14 MED ORDER — CARVEDILOL 25 MG PO TABS
25.0000 mg | ORAL_TABLET | Freq: Two times a day (BID) | ORAL | 1 refills | Status: DC
  Filled 2021-03-14: qty 60, 30d supply, fill #0

## 2021-03-14 MED ORDER — ACETAMINOPHEN 325 MG PO TABS: 325.0000 mg | ORAL_TABLET | ORAL | Status: AC | PRN

## 2021-03-14 MED ORDER — ROSUVASTATIN CALCIUM 40 MG PO TABS
40.0000 mg | ORAL_TABLET | Freq: Every day | ORAL | 0 refills | Status: DC
  Filled 2021-03-14: qty 30, 30d supply, fill #0

## 2021-03-14 MED ORDER — ALBUTEROL SULFATE HFA 108 (90 BASE) MCG/ACT IN AERS
2.0000 | INHALATION_SPRAY | Freq: Four times a day (QID) | RESPIRATORY_TRACT | 1 refills | Status: DC | PRN
  Filled 2021-03-14: qty 8.5, 30d supply, fill #0
  Filled 2021-11-22: qty 8.5, 30d supply, fill #1

## 2021-03-14 MED ORDER — LANTUS SOLOSTAR 100 UNIT/ML ~~LOC~~ SOPN
16.0000 [IU] | PEN_INJECTOR | Freq: Two times a day (BID) | SUBCUTANEOUS | 2 refills | Status: DC
  Filled 2021-03-14: qty 12, 38d supply, fill #0
  Filled 2021-04-17: qty 24, 75d supply, fill #1

## 2021-03-14 MED ORDER — CLOPIDOGREL BISULFATE 75 MG PO TABS
75.0000 mg | ORAL_TABLET | Freq: Every day | ORAL | 0 refills | Status: DC
  Filled 2021-03-14: qty 9, 9d supply, fill #0

## 2021-03-14 MED ORDER — EMPAGLIFLOZIN 25 MG PO TABS
25.0000 mg | ORAL_TABLET | Freq: Every day | ORAL | 0 refills | Status: DC
  Filled 2021-03-14: qty 30, 30d supply, fill #0

## 2021-03-14 MED ORDER — INSULIN ASPART 100 UNIT/ML IJ SOLN
8.0000 [IU] | Freq: Three times a day (TID) | INTRAMUSCULAR | Status: DC
  Administered 2021-03-14 – 2021-03-16 (×6): 8 [IU] via SUBCUTANEOUS

## 2021-03-14 MED ORDER — VITAMIN D3 25 MCG PO TABS
1000.0000 [IU] | ORAL_TABLET | Freq: Every day | ORAL | 0 refills | Status: DC
  Filled 2021-03-14: qty 30, 30d supply, fill #0

## 2021-03-14 MED ORDER — LOSARTAN POTASSIUM 25 MG PO TABS
25.0000 mg | ORAL_TABLET | Freq: Every day | ORAL | 0 refills | Status: DC
  Filled 2021-03-14: qty 30, 30d supply, fill #0

## 2021-03-14 NOTE — Progress Notes (Signed)
Recreational Therapy Assessment and Plan  Patient Details  Name: Thomas Gardner MRN: [id number] Date of Birth: 1956-05-01 Today's Date: 03/14/2021  Rehab Potential:  Good  ELOS:     Assessment   Hospital Problem: Principal Problem:   Acute stroke of medulla oblongata ([place])     Past Medical History:      Past Medical History:  Diagnosis Date   Arthritis     Asthma     CHF (congestive heart failure) ([place])     Diabetes mellitus 2003   GERD (gastroesophageal reflux disease)     Hyperlipidemia     Hypertension     Morbid obesity ([place])     Sleep apnea      Past Surgical History:       Past Surgical History:  Procedure Laterality Date   BREATH TEK H PYLORI   11/11/2011    Procedure: BREATH TEK H PYLORI;  Surgeon: Shann Medal, MD;  Location: [facility];  Service: General;  Laterality: N/A;   CARDIAC CATHETERIZATION       ESOPHAGOGASTRODUODENOSCOPY (EGD) WITH PROPOFOL N/A 05/04/2014    Procedure: ESOPHAGOGASTRODUODENOSCOPY (EGD) WITH PROPOFOL;  Surgeon: Cleotis Nipper, MD;  Location: [facility] ENDOSCOPY;  Service: Endoscopy;  Laterality: N/A;      Assessment & Plan Clinical Impression: Patient is a 65 year old Rh-male with  history of T2DM, HFrEF, HTN, OSA, headaches, morbid obesity, recent wisdom tooth extraction who was admitted on 03/02/2021 with reports of persistent HA, dizziness, and difficulty walking with tendency to fall to the left.  History taken from patient and chart review.  UDS positive for opiates. CTA head done revealing multifocal moderate/severe stenosis of right PCA, severe stenosis proximal L-ECA, incidental 3.6 cm right thyroid nodule and was negative for LVO.  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  Echocardiogram with ejection fraction of 50%, trivial MVR and TVR, no wall abnormality and low normal LVF. 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 recommended  DAPT X 3 weeks followed by ASA alone as well control of risk factors. He has been followed by teaching service and thyroid ultrasound done showing slight interval increase in complex cystic mass in midline of neck with recommendations for ENT referral and stable right thyroid nodule.     Hospital course has been complicated by labile blood pressures and home meds slowly resumed. Patient with resultant dizziness, tremors with Left knee instability, weakness left hand?, shuffling/scissoring gait and balance deficits affecting mobility and ADLs. CIR recommended due to functional decline. Patient transferred to CIR on 03/07/2021 .    Pt presents with decreased balance, decreased coordination, feelings of stress Limiting pt's independence with leisure/community pursuits.  Plan  Min 1 TR stress management group >45 min  Recommendations for other services: None   Discharge Criteria: Patient will be discharged from TR if patient refuses treatment 3 consecutive times without medical reason.  If treatment goals not met, if there is a change in medical status, if patient makes no progress towards goals or if patient is discharged from hospital.  The above assessment, treatment plan, treatment alternatives and goals were discussed and mutually agreed upon: by patient  SESSION NOTE:  No c/o pain Pt actively participated in stress managment/coping group today.  Pt education/discussion focused on stress exploration including factors that contribute to stress, factors that protect against stress and potential coping strategies.  Coping strategies included deep breathing, progressive muscle  relaxation, imagery & challenging irrational thoughts.  Handouts provided.  Pt stated understanding and appreciation for this group session.  No further TR as pt is expected to discharge 10/28   Talin Rozeboom 03/14/2021, 4:09 PM

## 2021-03-14 NOTE — Progress Notes (Signed)
PROGRESS NOTE   Subjective/Complaints:  Appreciate diabetes coordinator note  Bruising on abd and Left triceps  ROS- neg CP, SOB, N/V/D,   Objective:   No results found. Recent Labs    03/12/21 0647  WBC 9.6  HGB 13.4  HCT 41.5  PLT 205     Recent Labs    03/12/21 0647  NA 136  K 4.0  CL 101  CO2 27  GLUCOSE 153*  BUN 23  CREATININE 1.29*  CALCIUM 9.1     No intake or output data in the 24 hours ending 03/14/21 0814       Physical Exam: Vital Signs Blood pressure (!) 145/81, pulse 84, temperature 98.3 F (36.8 C), temperature source Oral, resp. rate 19, height _0  (1.676 m), weight 111 kg, SpO2 97 %.  General: No acute distress Mood and affect are appropriate Heart: Regular rate and rhythm no rubs murmurs or extra sounds Lungs: Clear to auscultation, breathing unlabored, no rales or wheezes Abdomen: Positive bowel sounds, soft nontender to palpation, nondistended Extremities: No clubbing, cyanosis, or edema Skin: No evidence of breakdown, no evidence of rash Neurologic: Cranial nerves II through XII intact, motor strength is 5/5 in bilateral deltoid, bicep, tricep, grip, hip flexor, knee extensors, ankle dorsiflexor and plantar flexor Sensory exam normal sensation to light touch and proprioception in bilateral upper and lower extremities Cerebellar exam normal finger to nose to finger  in RIght upper , mild dysmetria , left upper  Musculoskeletal: Full range of motion in all 4 extremities. No joint swelling    Assessment/Plan: 1. Functional deficits which require 3+ hours per day of interdisciplinary therapy in a comprehensive inpatient rehab setting. Physiatrist is providing close team supervision and 24 hour management of active medical problems listed below. Physiatrist and rehab team continue to assess barriers to discharge/monitor patient progress toward functional and medical goals  Care  Tool:  Bathing    Body parts bathed by patient: Right arm, Left arm, Chest, Abdomen, Face, Front perineal area, Right upper leg, Left upper leg, Buttocks, Right lower leg, Left lower leg   Body parts bathed by helper: Buttocks, Left lower leg, Right lower leg     Bathing assist Assist Level: Independent with assistive device     Upper Body Dressing/Undressing Upper body dressing   What is the patient wearing?: Pull over shirt    Upper body assist Assist Level: Independent    Lower Body Dressing/Undressing Lower body dressing      What is the patient wearing?: Pants, Underwear/pull up     Lower body assist Assist for lower body dressing: Independent with assitive device     Toileting Toileting    Toileting assist Assist for toileting: Independent with assistive device     Transfers Chair/bed transfer  Transfers assist     Chair/bed transfer assist level: Supervision/Verbal cueing     Locomotion Ambulation   Ambulation assist      Assist level: Supervision/Verbal cueing Assistive device: Walker-rolling Max distance: 200 ft   Walk 10 feet activity   Assist     Assist level: Supervision/Verbal cueing Assistive device: Walker-rolling   Walk 50 feet activity   Assist Walk  50 feet with 2 turns activity did not occur: Safety/medical concerns  Assist level: Supervision/Verbal cueing Assistive device: Walker-rolling    Walk 150 feet activity   Assist Walk 150 feet activity did not occur: Safety/medical concerns  Assist level: Supervision/Verbal cueing Assistive device: Walker-rolling    Walk 10 feet on uneven surface  activity   Assist Walk 10 feet on uneven surfaces activity did not occur: Safety/medical concerns   Assist level: Contact Guard/Touching assist Assistive device: Walker-rolling   Wheelchair     Assist Is the patient using a wheelchair?: Yes Type of Wheelchair: Manual    Wheelchair assist level: Minimal Assistance  - Patient > 75% Max wheelchair distance: 100    Wheelchair 50 feet with 2 turns activity    Assist        Assist Level: Minimal Assistance - Patient > 75%   Wheelchair 150 feet activity     Assist      Assist Level: Moderate Assistance - Patient 50 - 74%   Blood pressure (!) 145/81, pulse 84, temperature 98.3 F (36.8 C), temperature source Oral, resp. rate 19, height _0  (1.676 m), weight 111 kg, SpO2 97 %.  Medical Problem List and Plan: 1. 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             -patient may shower             -ELOS/Goals: 7-10 days/supervision/mod I             Continue CIR PT, OT, Team conference today please see physician documentation under team conference tab, met with team  to discuss problems,progress, and goals. Formulized individual treatment plan based on medical history, underlying problem and comorbidities.  2.  Impaired mobility, ambulating >100' d/c Lovenox             -antiplatelet therapy: DAPT X 3 weeks followed by ASA alone.  3. Chronic HA/Pain Management: Continue tylenol prn.  4. Mood: LCSW to follow for evaluation and support.              -antipsychotic agents: N/a 5. Neuropsych: This patient is capable of making decisions on his own behalf. 6. Skin/Wound Care: Routine pressure relief measures. Abd ecchymosis due to lovenox will d/c as pt amb greater distances  7. Fluids/Electrolytes/Nutrition: Monitor I/Os 8. Chronic HFrEF/HTN:  On Losartan, Coreg, hygroton, Crestor and Jardiance.  -- Daily weights for signs of overload.              --has seen Dr. Einar Gip in the past.  9.T2DM: Hgb A1C- 9.9 and poorly controlled as he feels weak if BS <200.              --Changed Jardiance to 98m BID given side effect of vertigo. d/c metformin (he has been refusing and does not plan on taking it at home), insulin glargine  as at  home and novolog tid AC. Increase Semglee to 16U. Patient has brought Victoza from home and it is being approved by pharmacy [facility] (last 3)  Recent Labs    03/13/21 1633 03/13/21 2107 03/14/21 0611  GLUCAP 191* 360* 166*   Elevated yesterday PM- pt drank cranberry juice but thought it was diet, cont to monitor Increase semglee, pt's home Victoza is expired Increase novalog to 8 U TID 10.  Dyslipidemia: LDL-121. Was non-compliant with meds. Continue to educate on compliance.              --  On Crestor and Zetia at home currently just on Crestor.  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 13. HTN: Now back on  Cozaar 49m qd (home dose 1054m , hygroton 2581md, Coreg 56m28mD. --will order orthostatic vitals.   Vitals:   03/13/21 2006 03/14/21 0515  BP: (!) 129/50 (!) 145/81  Pulse: 86 84  Resp: 18 19  Temp: 98.2 F (36.8 C) 98.3 F (36.8 C)  SpO2: 99% 97%  Elevated will increase hygrotan to 56mg102mme dose) 14. Hypokalemia: Will add low dose supplement for recurrent hypokalemia (hygroton on board).              K+ stable and normal  15. OSA: Machine broke/lack of insurance--will need sleep study after discharge.       LOS: 7 days A FACE TO FACE EVALUATION WAS PERFORMED  AndreCharlett Blake6/2022, 8:14 AM

## 2021-03-14 NOTE — Progress Notes (Addendum)
Patient ID: Thomas Gardner, male   DOB: [birth year], 65 y.o.   MRN: [id number] Team Conference Report to Patient/Family  Team Conference discussion was reviewed with the patient and caregiver, including goals, any changes in plan of care and target discharge date.  Patient and caregiver express understanding and are in agreement.  The patient has a target discharge date of 03/16/21.  SW met with patient, provided conference updates. Patient ready for D/C. Neuro Op referral. SW will provide number for transportation and Access [place] Application. Sw will order RW. Pt will discharge after lunch on Friday Dyanne Iha 03/14/2021, 2:03 PM

## 2021-03-14 NOTE — Patient Care Conference (Signed)
Inpatient RehabilitationTeam Conference and Plan of Care Update Date: 03/14/2021   Time: 10:15 AM    Patient Name: Thomas Gardner      Medical Record Number: [id number]  Date of Birth: 12/19/1955 Sex: Male         Room/Bed: 4M04C/4M04C-01 Payor Info: Payor: [employer] / Plan: [employer] MEDICARE / Product Type: *No Product type* /    Admit Date/Time:  03/07/2021  1:37 PM  Primary Diagnosis:  Acute stroke of medulla oblongata [facility])  Hospital Problems: Principal Problem:   Acute stroke of medulla oblongata [facility])    Expected Discharge Date: Expected Discharge Date: 03/16/21  Team Members Present: Physician leading conference: Dr. Alysia Penna Social Worker Present: Erlene Quan, BSW Nurse Present: Dorien Chihuahua, RN PT Present: Page Spiro, PT OT Present: Providence Lanius, OT SLP Present: Sherren Kerns, SLP PPS Coordinator present : Ileana Ladd, PT     Current Status/Progress Goal Weekly Team Focus  Bowel/Bladder   continent to bowel and bladder LBM 10/25  continent      Swallow/Nutrition/ Hydration             ADL's   ind for UB bathing and dressing, mod I for seated grooming, toileting, S for LB dressing and bathing, bathroom transfers with RW  mod I bathroom transfers, IADL, BADL  self-care/balance/transfer retraining, LUE NMR, pt/family/AE/DME education, energy conservation   Mobility   continued but decreasing ataxic movements in trunk and RUE>RLE - seen more when fatigued, personal safety awareness is great except when it comes to fatigue/ energy conservation and pushing himself in sessions::: Bed mobility = Mod I; sit<>stand = Mod I, stand pivot = sup; short distance indoor amb with RW = Mod I/ sup, can reach up to 200'+ with RW, but pace is extremely slow, pt is very conscious of quality of LLE stepping  Bed mobility at Mod I level; Transfers at Mod I level; car transfer at supervision level; gait and steps to enter/ exit home at supervision  level  L hemipareisis NMR, increasing activity tolerance, increasing awareness of need for energy conservation to reduce intensity of ataxia with fatigue, NMR for ataxic movements, improving LOA for all transfers to prep for partial/ full supervision at d/c location, education for support group on d/c   Communication             Safety/Cognition/ Behavioral Observations  aware of safety limitations         Pain   Denies pain  denies pain      Skin   skin intact  skin to remain intact        Discharge Planning:  Discharging home with friends, in process of moving. Has a sister to assit that can help as well.   Team Discussion: Patient with left medullary infarct without right side weakness, BP managed but DM varies. Diabetic Coordinator referral and continue education on DM management with decreased intake of snacks in the afternoons. Note right side tremor and extremely slow pace; anxiety that limits progression.  Patient on target to meet rehab goals: yes, currently mod I for transfers and able to ambulate up to 200' with supervision. Goals for discharge set for Mod I  with supervision for walking.  *See Care Plan and progress notes for long and short-term goals.   Revisions to Treatment Plan:  Diabetic Coordinator Education Practice with steps   Teaching Needs: Safety, transfers, toileting, dietary modifications, medications, secondary risk management, etc  Current Barriers to Discharge: Home enviroment access/layout  and Lack of/limited family support  Possible Resolutions to Barriers: Education with sister [facility] follow up     Medical Summary Current Status: Diabetes uncontrolled, blood pressure control improving  Barriers to Discharge: Medical stability   Possible Resolutions to [employer] Focus: Continue insulin adjustment, diabetes nurse coordinator help appreciated   Continued Need for Acute Rehabilitation Level of Care: The patient requires daily medical  management by a physician with specialized training in physical medicine and rehabilitation for the following reasons: Direction of a multidisciplinary physical rehabilitation program to maximize functional independence : Yes Medical management of patient stability for increased activity during participation in an intensive rehabilitation regime.: Yes Analysis of laboratory values and/or radiology reports with any subsequent need for medication adjustment and/or medical intervention. : Yes   I attest that I was present, lead the team conference, and concur with the assessment and plan of the team.   Dorien Chihuahua B 03/14/2021, 3:18 PM

## 2021-03-14 NOTE — Progress Notes (Signed)
Physical Therapy Session Note  Patient Details  Name: Thomas Gardner MRN: [id number] Date of Birth: 1955/10/11  Today's Date: 03/14/2021 PT Individual Time: 0805-0903 PT Individual Time Calculation (min): 58 min   Short Term Goals: Week 1:  PT Short Term Goal 1 (Week 1): Pt will Propell WC 186ft with supevisoin assist PT Short Term Goal 2 (Week 1): Pt will trasnfer to and from [facility] with CGA and LRAD PT Short Term Goal 3 (Week 1): Pt will ambulate 137ft with min A. PT Short Term Goal 4 (Week 1): Pt will ascend 1 step with RW and min assist  Skilled Therapeutic Interventions/Progress Updates:    Pt received sitting EOB and agreeable to therapy session. Reports need to use bathroom. Sit>stand EOB>RW with close supervision for safety. Gait training ~47ft into bathroom using RW with supervision - demos slow, cautious gait speed with decreased step lengths and increased time in double limb support - has "shaking" in R LE during stance. Able to complete toileting Mod-I using RW - continent of bowel and bladder. Gait training ~34ft back to sink using RW with supervision progressing to CGA as pt with fatigue after standing for increased time at sink while performing hand hygiene and oral care without assist. Sitting in recliner donned pants without assist and shoes with assist only to tie them - reports still having soreness in L foot arch with pt stating this is due to poor ankle stability causing mild inversion during stance (did notice this during longer distance gait; however, do not feel bracing needed at this time as pt able to actively control this movement).   Gait training ~140ft to main therapy gym using RW with CGA for safety due to pt continuing to demo R LE "shaking" or "trembling" during stance - has slight L ankle inversion on initial contact but corrects prior to pt advancing to weightbearing - has reciprocal stepping pattern with good L LE foot clearance - continues to have extremely slow  gait speed with pauses in double limb support - verbal cuing throughout for increased gait speed.  Participated in Timed Up and Go (TUG): 1st trial: 40 seconds 2nd trial: 35 seconds  Patient demonstrates high fall risk as indicated by requiring >13.5seconds to complete the TUG.    Stair navigation training for home entry via stepping on, walking across, and stepping down from a 4" step using RW - pt demos good recall/carryover of sequencing AD and LE stepping ascending stepping up with R LE and down with L LE - CGA for safety but no instability noted. Progressed to 4" step 2x (placing 4" platform in front of 8" curb step) - navigating this in same technique as just described with CGA for safety.  Gait training ~15ft back to room using RW with CGA and same gait deviations as noted above in addition to pt having excessive hip flexed posturing therefore therapist tactile cuing for increased hip extension activation to lead with pelvis during gait (as opposed to leaning forward on AD) - with pt reporting feeling more secure this way. Continues to have significantly decreased gait speed taking ~39minutes to ambulate this distance. Pt left seated in recliner with needs in reach and chair alarm on.  Therapy Documentation Precautions:  Precautions Precautions: Fall Precaution Comments: tremors/shakiness, mild L hemi Restrictions Weight Bearing Restrictions: No   Pain:  Reports L knee "ache" - educated on PRN tylenol available if needed - provided seated rest breaks for pain management. Reports L foot arch pain -  details above.   Therapy/Group: Individual Therapy  Tawana Scale , PT, DPT, NCS, CSRS  03/14/2021, 7:48 AM

## 2021-03-14 NOTE — Progress Notes (Signed)
Occupational Therapy Session Note  Patient Details  Name: Thomas Gardner MRN: [id number] Date of Birth: 1956/03/18  Today's Date: 03/14/2021 OT Group Time: 1430-1530 OT Group Time Calculation (min): 60 min   Short Term Goals: Week 1:  OT Short Term Goal 1 (Week 1): STG  = LTG 2/2 ELOS  Skilled Therapeutic Interventions/Progress Updates:  Pt participated in group session with a focus on stress mgmt, education on healthy coping strategies, and social interaction. Focus of session on providing coping strategies to manage new current level of function as a result of new diagnosis. Session focus on breaking down stressors into "daily hassles," "major life stressors" and "life circumstances" in an effort to allow pts to chunk their stressors into groups. Pt actively sharing stressors and contributing to group conversation. Pt reports most of his stressors come from loss of independence and feeling bad when pt has to ask for help with toileting etc. Provided therapeutic listening and emotional support throughout session. Offered education on factors that protect [place] against stress such as "daily uplifts," "healthy coping strategies" and "protective factors." Encouraged all group members to make an effort to actively recall one event from their day that was a daily uplift in an effort to protect their mindset from stressors. Issued pt handouts on healthy coping strategies to implement into routine. Pt transported back to room by RT. Therapy Documentation Precautions:  Precautions Precautions: Fall Precaution Comments: tremors/shakiness, mild L hemi Restrictions Weight Bearing Restrictions: No  Pain: pt reports no pain during session     Therapy/Group: Group Therapy  Precious Haws 03/14/2021, 4:09 PM

## 2021-03-14 NOTE — Progress Notes (Signed)
Physical Therapy Session Note  Patient Details  Name: Thomas Gardner MRN: [id number] Date of Birth: 11-Jul-1955  Today's Date: 03/14/2021 PT Individual Time: 0930-1000 PT Individual Time Calculation (min): 30 min   Short Term Goals: Week 1:  PT Short Term Goal 1 (Week 1): Pt will Propell WC 121ft with supevisoin assist PT Short Term Goal 2 (Week 1): Pt will trasnfer to and from [facility] with CGA and LRAD PT Short Term Goal 3 (Week 1): Pt will ambulate 123ft with min A. PT Short Term Goal 4 (Week 1): Pt will ascend 1 step with RW and min assist  Skilled Therapeutic Interventions/Progress Updates:    Pt received seated in recliner in room, agreeable to PT session. Pt reports some soreness in his L knee at rest, not rated and declines intervention. Sit to stand with Supervision to RW throughout session. Ambulation 2 x 100 ft with RW and close Supervision, focus on L heel strike, decreased shoulder elevation, and overall balance during gait. Pt does exhibit onset of truncal ataxia with onset of fatigue. Squat holds 2 x 5 reps for 5 sec hold for quad strengthening with RW and CGA for balance. Sidesteps L/R 2 x 10 ft with RW and close Supervision for balance for hip strengthening and simulation of maneuvering RW through tight spaces. Toilet transfer mod I. Pt left seated in recliner in room with needs in reach, chair alarm in place.  Therapy Documentation Precautions:  Precautions Precautions: Fall Precaution Comments: tremors/shakiness, mild L hemi Restrictions Weight Bearing Restrictions: No      Therapy/Group: Individual Therapy   Excell Seltzer, PT, DPT, CSRS  03/14/2021, 11:54 AM

## 2021-03-14 NOTE — Progress Notes (Signed)
Occupational Therapy Session Note  Patient Details  Name: Thomas Gardner MRN: [id number] Date of Birth: 07-12-1955  Today's Date: 03/14/2021 OT Individual Time: 6751-9824 OT Individual Time Calculation (min): 53 min    Short Term Goals: Week 1:  OT Short Term Goal 1 (Week 1): STG  = LTG 2/2 ELOS  Skilled Therapeutic Interventions/Progress Updates:    Pt received seated in recliner, denies pain but endorses he had nausea earlier, but is feeling better and agreeable to therapy. Session focus on self-care retraining, activity tolerance, static standing balance, functional transfers in prep for improved ADL/IADL/func mobility performance + decreased caregiver burden. Doffed shoes with independence. Able to pick out clothes from bag when bag handed to him. Sit to stand and ambulated > toilet with RW + S , noted improvement in gait speed and no RLE tremor. Completed toileting tasks mod I post continent void. Ambulatory transfer > TTB with RW and S. Able to complete dressing and bathing at mod I level with use of TTB and BUE support on grab bars for sit to stand. Required set-up A for gathering towels and placing on floor and total A to don B gripper socks. Ambulatory transfer > bed > sink. Completed LB dressing sitting EOB with mod I and standing grooming tasks with S. Pt noted with trunk flexion and BUE support on counter top while completing. Donned B shoes with min A to tie R shoe.   Total A w/c transport > gym for time management and energy conservation.   In standing, pt able to toss and catch ball with BUE from various angles. Required CGA for balance throughout due to RLE tremor and 1 small posterior LOB, but pt able to self correct with LUE support on RW. Ambulatory transfer back to room with CGA + RW and cues for increased gait speed and to maintain upright posture. Required 2 standing rest breaks and close w/c follow.  Pt left seated in recliner with NT present with call bell in reach, and  all immediate needs met.    Therapy Documentation Precautions:  Precautions Precautions: Fall Precaution Comments: tremors/shakiness, mild L hemi Restrictions Weight Bearing Restrictions: No  Pain:   denies ADL: See Care Tool for more details.   Therapy/Group: Individual Therapy  Volanda Napoleon MS, OTR/L  03/14/2021, 6:35 AM

## 2021-03-14 NOTE — Discharge Instructions (Addendum)
Inpatient Rehab Discharge Instructions  EOGHAN BELCHER Discharge date and time: No discharge date for patient encounter.   Activities/Precautions/ Functional Status: Activity: activity as tolerated Diet: diabetic diet Wound Care: Routine skin checks Functional status:  ___ No restrictions     ___ Walk up steps independently ___ 24/7 supervision/assistance   ___ Walk up steps with assistance ___ Intermittent supervision/assistance  ___ Bathe/dress independently ___ Walk with walker     __x_ Bathe/dress with assistance ___ Walk Independently    ___ Shower independently ___ Walk with assistance    ___ Shower with assistance ___ No alcohol     ___ Return to work/school ________  COMMUNITY REFERRALS UPON DISCHARGE:     Outpatient: PT     OT                Agency: [facility] Phone: [phone]              Appointment Date/Time:TBD  Medical Equipment/Items Ordered:Rolling Gilford Rile, Transfer Bench                                                 Agency/Supplier: [employer]   Special Instructions: No driving smoking or alcohol  Aspirin and Plavix x9 more days then aspirin alone   My questions have been answered and I understand these instructions. I will adhere to these goals and the provided educational materials after my discharge from the hospital.  Patient/Caregiver Signature _______________________________ Date __________  Clinician Signature _______________________________________ Date __________  Please bring this form and your medication list with you to all your follow-up doctor's appointments.

## 2021-03-15 ENCOUNTER — Other Ambulatory Visit (HOSPITAL_COMMUNITY): Payer: Self-pay

## 2021-03-15 LAB — GLUCOSE, CAPILLARY
Glucose-Capillary: 172 mg/dL — ABNORMAL HIGH (ref 70–99)
Glucose-Capillary: 229 mg/dL — ABNORMAL HIGH (ref 70–99)
Glucose-Capillary: 143 mg/dL — ABNORMAL HIGH (ref 70–99)
Glucose-Capillary: 202 mg/dL — ABNORMAL HIGH (ref 70–99)

## 2021-03-15 NOTE — Plan of Care (Signed)
  Problem: RH Furniture Transfers Goal: LTG Patient will perform furniture transfers w/assist (OT/PT) Description: LTG: Patient will perform furniture transfers  with assistance (OT/PT). Outcome: Completed/Met   Problem: RH Balance Goal: LTG Patient will maintain dynamic standing with ADLs (OT) Description: LTG:  Patient will maintain dynamic standing balance with assist during activities of daily living (OT)  Outcome: Completed/Met   Problem: Sit to Stand Goal: LTG:  Patient will perform sit to stand in prep for activites of daily living with assistance level (OT) Description: LTG:  Patient will perform sit to stand in prep for activites of daily living with assistance level (OT) Outcome: Completed/Met   Problem: RH Eating Goal: LTG Patient will perform eating w/assist, cues/equip (OT) Description: LTG: Patient will perform eating with assist, with/without cues using equipment (OT) Outcome: Completed/Met   Problem: RH Grooming Goal: LTG Patient will perform grooming w/assist,cues/equip (OT) Description: LTG: Patient will perform grooming with assist, with/without cues using equipment (OT) Outcome: Completed/Met   Problem: RH Bathing Goal: LTG Patient will bathe all body parts with assist levels (OT) Description: LTG: Patient will bathe all body parts with assist levels (OT) Outcome: Completed/Met   Problem: RH Dressing Goal: LTG Patient will perform upper body dressing (OT) Description: LTG Patient will perform upper body dressing with assist, with/without cues (OT). Outcome: Completed/Met Goal: LTG Patient will perform lower body dressing w/assist (OT) Description: LTG: Patient will perform lower body dressing with assist, with/without cues in positioning using equipment (OT) Outcome: Completed/Met   Problem: RH Toileting Goal: LTG Patient will perform toileting task (3/3 steps) with assistance level (OT) Description: LTG: Patient will perform toileting task (3/3 steps) with  assistance level (OT)  Outcome: Completed/Met   Problem: RH Functional Use of Upper Extremity Goal: LTG Patient will use RT/LT upper extremity as a (OT) Description: LTG: Patient will use right/left upper extremity as a stabilizer/gross assist/diminished/nondominant/dominant level with assist, with/without cues during functional activity (OT) Outcome: Completed/Met   Problem: RH Simple Meal Prep Goal: LTG Patient will perform simple meal prep w/assist (OT) Description: LTG: Patient will perform simple meal prep with assistance, with/without cues (OT). Outcome: Completed/Met   Problem: RH Light Housekeeping Goal: LTG Patient will perform light housekeeping w/assist (OT) Description: LTG: Patient will perform light housekeeping with assistance, with/without cues (OT). Outcome: Completed/Met   Problem: RH Toilet Transfers Goal: LTG Patient will perform toilet transfers w/assist (OT) Description: LTG: Patient will perform toilet transfers with assist, with/without cues using equipment (OT) Outcome: Completed/Met   Problem: RH Tub/Shower Transfers Goal: LTG Patient will perform tub/shower transfers w/assist (OT) Description: LTG: Patient will perform tub/shower transfers with assist, with/without cues using equipment (OT) Outcome: Completed/Met

## 2021-03-15 NOTE — Progress Notes (Signed)
PROGRESS NOTE   Subjective/Complaints:  Discussed D/C date as well as diabetic management , pt does not have glucometer at home but discussed continuous glucose monitor with Int med clinic  ROS- neg CP, SOB, N/V/D,   Objective:   No results found. No results for input(s): WBC, HGB, HCT, PLT in the last 72 hours.   No results for input(s): NA, K, CL, CO2, GLUCOSE, BUN, CREATININE, CALCIUM in the last 72 hours.    Intake/Output Summary (Last 24 hours) at 03/15/2021 0750 Last data filed at 03/14/2021 1903 Gross per 24 hour  Intake 360 ml  Output --  Net 360 ml         Physical Exam: Vital Signs Blood pressure (!) 158/77, pulse 78, temperature 98.9 F (37.2 C), temperature source Oral, resp. rate 18, height 5\' 6"  (1.676 m), weight 109 kg, SpO2 96 %.  General: No acute distress Mood and affect are appropriate Heart: Regular rate and rhythm no rubs murmurs or extra sounds Lungs: Clear to auscultation, breathing unlabored, no rales or wheezes Abdomen: Positive bowel sounds, soft nontender to palpation, nondistended Extremities: No clubbing, cyanosis, or edema Skin: No evidence of breakdown, no evidence of rash   Neurologic: Cranial nerves II through XII intact, motor strength is 5/5 in bilateral deltoid, bicep, tricep, grip, hip flexor, knee extensors, ankle dorsiflexor and plantar flexor Sensory exam normal sensation to light touch and proprioception in bilateral upper and lower extremities Cerebellar exam normal finger to nose to finger  in RIght upper , mild dysmetria , left upper  Musculoskeletal: Full range of motion in all 4 extremities. No joint swelling    Assessment/Plan: 1. Functional deficits which require 3+ hours per day of interdisciplinary therapy in a comprehensive inpatient rehab setting. Physiatrist is providing close team supervision and 24 hour management of active medical problems listed  below. Physiatrist and rehab team continue to assess barriers to discharge/monitor patient progress toward functional and medical goals  Care Tool:  Bathing    Body parts bathed by patient: Right arm, Left arm, Chest, Abdomen, Face, Front perineal area, Right upper leg, Left upper leg, Buttocks, Right lower leg, Left lower leg   Body parts bathed by helper: Buttocks, Left lower leg, Right lower leg     Bathing assist Assist Level: Independent with assistive device     Upper Body Dressing/Undressing Upper body dressing   What is the patient wearing?: Pull over shirt    Upper body assist Assist Level: Independent    Lower Body Dressing/Undressing Lower body dressing      What is the patient wearing?: Pants, Underwear/pull up     Lower body assist Assist for lower body dressing: Independent with assitive device     Toileting Toileting    Toileting assist Assist for toileting: Independent with assistive device     Transfers Chair/bed transfer  Transfers assist     Chair/bed transfer assist level: Supervision/Verbal cueing Chair/bed transfer assistive device: Programmer, multimedia   Ambulation assist      Assist level: Contact Guard/Touching assist Assistive device: Walker-rolling Max distance: 143ft   Walk 10 feet activity   Assist     Assist level:  Supervision/Verbal cueing Assistive device: Walker-rolling   Walk 50 feet activity   Assist Walk 50 feet with 2 turns activity did not occur: Safety/medical concerns  Assist level: Supervision/Verbal cueing Assistive device: Walker-rolling    Walk 150 feet activity   Assist Walk 150 feet activity did not occur: Safety/medical concerns  Assist level: Supervision/Verbal cueing Assistive device: Walker-rolling    Walk 10 feet on uneven surface  activity   Assist Walk 10 feet on uneven surfaces activity did not occur: Safety/medical concerns   Assist level: Contact Guard/Touching  assist Assistive device: Walker-rolling   Wheelchair     Assist Is the patient using a wheelchair?: Yes Type of Wheelchair: Manual    Wheelchair assist level: Minimal Assistance - Patient > 75% Max wheelchair distance: 100    Wheelchair 50 feet with 2 turns activity    Assist        Assist Level: Minimal Assistance - Patient > 75%   Wheelchair 150 feet activity     Assist      Assist Level: Moderate Assistance - Patient 50 - 74%   Blood pressure (!) 158/77, pulse 78, temperature 98.9 F (37.2 C), temperature source Oral, resp. rate 18, height 5\' 6"  (1.676 m), weight 109 kg, SpO2 96 %.  Medical Problem List and Plan: 1. 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             -patient may shower             -ELOS/Goals: 10/28 supervision/mod I             Continue CIR PT, OT, .  2.  Impaired mobility, ambulating >100' d/c Lovenox             -antiplatelet therapy: DAPT X 3 weeks followed by ASA alone.  3. Chronic HA/Pain Management: Continue tylenol prn.  4. Mood: LCSW to follow for evaluation and support.              -antipsychotic agents: N/a 5. Neuropsych: This patient is capable of making decisions on his own behalf. 6. Skin/Wound Care: Routine pressure relief measures. Abd ecchymosis due to lovenox will d/c as pt amb greater distances  7. Fluids/Electrolytes/Nutrition: Monitor I/Os 8. Chronic HFrEF/HTN:  On Losartan, Coreg, hygroton, Crestor and Jardiance.  -- Daily weights for signs of overload.              --has seen Dr. Einar Gip in the past.  9.T2DM: Hgb A1C- 9.9 and poorly controlled as he feels weak if BS <200.              --Changed Jardiance to 10mg  BID given side effect of vertigo. d/c metformin (he has been refusing and does not plan on taking it at home), insulin glargine  as at home and novolog tid AC. Increase Semglee to 16U.  Patient has brought Victoza from home and it is being approved by pharmacy [facility] (last 3)  Recent Labs    03/14/21 1640 03/14/21 2110 03/15/21 0614  GLUCAP 194* 253* 172*   Elevated yesterday PM- pt drank cranberry juice but thought it was diet, cont to monitor Increase semglee, pt's home Victoza is expired Increase novalog to 8 U TID 10.  Dyslipidemia: LDL-121. Was non-compliant with meds. Continue to educate on compliance.              --On Crestor and Zetia at home currently just on Crestor.  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 13. HTN: Now back on  Cozaar 25mg  qd (home dose 100mg ) , hygroton 25mg  qd, Coreg 25mg  BID. --will order orthostatic vitals.   Vitals:   03/14/21 2030 03/15/21 0546  BP: 140/78 (!) 158/77  Pulse: 68 78  Resp: 18 18  Temp: 97.8 F (36.6 C) 98.9 F (37.2 C)  SpO2: 100% 96%  Elevated will increase hygrotan to 25mg  (home dose) 14. Hypokalemia: Will add low dose supplement for recurrent hypokalemia (hygroton on board).              K+ stable and normal  15. OSA: Machine broke/lack of insurance--will need sleep study after discharge.       LOS: 8 days A FACE TO FACE EVALUATION WAS PERFORMED  Charlett Blake 03/15/2021, 7:50 AM

## 2021-03-15 NOTE — Progress Notes (Signed)
Patient ID: Thomas Gardner, male   DOB: 10/25/1955, 65 y.o.   MRN: [id number]  Rolling Walker and TTB ordered through [employer].  [place], [place]

## 2021-03-15 NOTE — Progress Notes (Signed)
Patient ID: Thomas Gardner, male   DOB: April 10, 1956, 65 y.o.   MRN: [id number] Follow up with the patient regarding pending discharge. Patient noted he felt comfortable with his discharge and medications. Discussed pm CBG elevations and 2 carb snacks with protein to help with metabolism. Also reviewed change to Semglee insulin per MD. Patient questioning restart of Victoza; referred to [facility] for discharge instructions. No other concerns noted. Margarito Liner

## 2021-03-15 NOTE — Progress Notes (Signed)
Occupational Therapy Discharge Summary  Patient Details  Name: Thomas Gardner MRN: [id number] Date of Birth: [birth year]  Today's Date: 03/15/2021 OT Individual Time: 0700-0753 OT Individual Time Calculation (min): 53 min    Patient has met 14 of 14 long term goals due to improved activity tolerance, improved balance, postural control, ability to compensate for deficits, functional use of  LEFT upper extremity, improved awareness, and improved coordination.  Patient to discharge at overall Modified Independent level.  Patient's care partner  NA  to provide the necessary physical assistance at discharge as pt to DC at mod I level.  Reasons goals not met: NA  Recommendation:  Patient will benefit from ongoing skilled OT services in outpatient setting to continue to advance functional skills in the area of BADL, iADL, and Reduce care partner burden.  Equipment: TTB  Reasons for discharge: treatment goals met and discharge from hospital  Patient/family agrees with progress made and goals achieved: Yes  OT Discharge Precautions/Restrictions  Precautions Precautions: Fall Precaution Comments: tremors/shakiness in RLE, mild L hemi Restrictions Weight Bearing Restrictions: No  Pain denies Pain Assessment Pain Scale: 0-10 Pain Score: 0-No pain ADL ADL Eating: Independent Where Assessed-Eating: Chair Grooming: Modified independent Where Assessed-Grooming: Sitting at sink, Standing at sink Upper Body Bathing: Independent Where Assessed-Upper Body Bathing: Shower Lower Body Bathing: Modified independent Where Assessed-Lower Body Bathing: Sitting at sink Upper Body Dressing: Independent Where Assessed-Upper Body Dressing: Sitting at sink Lower Body Dressing: Modified independent Where Assessed-Lower Body Dressing: Edge of bed Toileting: Modified independent Where Assessed-Toileting: Glass blower/designer: Diplomatic Services operational officer Method: Air traffic controller: Energy manager: Modified independent Clinical cytogeneticist Method: Psychologist, educational: Modified independent Social research officer, government Method: Ambulating Vision Baseline Vision/History: 1 Wears glasses Patient Visual Report: No change from baseline Vision Assessment?: No apparent visual deficits Perception  Perception: Within Functional Limits Praxis Praxis: Intact Cognition Overall Cognitive Status: Within Functional Limits for tasks assessed Arousal/Alertness: Awake/alert Orientation Level: Oriented X4 Year: 2022 Month: October Day of Week: Correct Immediate Memory Recall: Sock;Blue;Bed Memory Recall Sock: Without Cue Memory Recall Blue: Without Cue Memory Recall Bed: Without Cue Awareness: Appears intact Problem Solving: Appears intact Safety/Judgment: Appears intact Sensation Sensation Light Touch: Impaired by gross assessment Proprioception: Appears Intact Stereognosis: Appears Intact Additional Comments: peripheral neuropathy BLE Motor  Motor Motor: Ataxia;Hemiplegia Motor - Skilled Clinical Observations: L sided ataxia, mild L hemiplegia Motor - Discharge Observations: Much improved from eval Mobility  Bed Mobility Supine to Sit: Independent Sit to Supine: Independent Transfers Sit to Stand: Independent with assistive device Stand to Sit: Independent with assistive device  Trunk/Postural Assessment  Cervical Assessment Cervical Assessment: Exceptions to [employer] (forward head) Thoracic Assessment Thoracic Assessment: Exceptions to [facility] (rounded shoulders) Lumbar Assessment Lumbar Assessment:  (posterior pelvic tilt) Postural Control Postural Control: Within Functional Limits  Balance Balance Balance Assessed: Yes Static Sitting Balance Static Sitting - Balance Support: Feet supported Static Sitting - Level of Assistance: 7: Independent Dynamic Sitting Balance Dynamic Sitting - Balance Support: Feet  supported Dynamic Sitting - Level of Assistance: 7: Independent Static Standing Balance Static Standing - Balance Support: Bilateral upper extremity supported;During functional activity Static Standing - Level of Assistance: 6: Modified independent (Device/Increase time) Dynamic Standing Balance Dynamic Standing - Balance Support: Bilateral upper extremity supported;During functional activity Dynamic Standing - Level of Assistance: 6: Modified independent (Device/Increase time) Extremity/Trunk Assessment RUE Assessment RUE Assessment: Within Functional Limits General Strength Comments: 5/5 in shoulder flexion LUE Assessment LUE Assessment: Within  Functional Limits General Strength Comments: slowed movements with finger to nose, improved from eval; 5/5 in shoulder flexion LUE Body System: Neuro Brunstrum levels for arm and hand: Arm;Hand Brunstrum level for arm: Stage V Relative Independence from Synergy Brunstrum level for hand: Stage VI Isolated joint movements  Session Note: Pt received seated EOB, denies pain and, agreeable to therapy. Session focus on self-care retraining, activity tolerance, dynamic standing balance in prep for improved ADL/IADL/func mobility performance + decreased caregiver burden. Donned B socks mod I at EOB. Sit to stand and ambulated to toilet with RW at mod I level. Completed toileting tasks mod I post continent void of bowel/bladder. Able to wash hands and complete grooming tasks in standing with mod I, as well. Ambulated to and from gym with RW and close S to monitor fatigue due to longer distance. Pt able to cue himself to increase gait speed.  Completed DC reassessments as documented above. Completed 4 passes in maxisky walking sling with and without RW. Pt able to weave in/out cones + hold cup of water without spillage with no LOB. Cues to swing arms and widen BOS. Pt very excited about progress.  Pt made mod I in room, NT/RN updated. Reviewed rec to always  use RW  and gripper socks prior to ambulating. Pt to call for assist if needed. Pt verbalized understanding.   Pt left seated in recliner with call bell in reach, and all immediate needs met.    Volanda Napoleon MS, OTR/L  03/15/2021, 12:34 PM

## 2021-03-15 NOTE — Progress Notes (Signed)
Patient ID: Thomas Gardner, male   DOB: [birth year], 65 y.o.   MRN: [id number]  Neuro OP order faxed

## 2021-03-15 NOTE — Progress Notes (Signed)
Occupational Therapy Session Note  Patient Details  Name: [name] MRN: [id number] Date of Birth: 23-Mar-1956  Today's Date: 03/15/2021 OT Group Time: 1430-1530 OT Group Time Calculation (min): 60 min   Short Term Goals: Week 1:  OT Short Term Goal 1 (Week 1): STG  = LTG 2/2 ELOS  Skilled Therapeutic Interventions/Progress Updates:  Pt participated in group session with a focus on BUE strength and endurance to facilitate improved activity tolerance and strength for higher level BADLs and functional mobility tasks. Pt engaged in seated UB therapeutic activity where pt was instructed to roll large dice when it was pts turn. Each number rolled correlated with UB therex/ number of repeptitions  listed on board. UB therex included flys, chest presses, punches, bicep curls, upward rows, over heard presses with a range of reps from 10-20.  Pt utilized 7lb/ 4 lb hand weights during session. Pt utilizes modifications such as decreasing amt of weight as needed, completing therex unilaterally and taking rest breaks as needed. Education provided on importance of determining appropriate modifications that benefited each pt  in order to meet pts specific needs. Pt actively participating in group conversations and encouraging other group members during therapeutic activity. Session ended with ball tosses to other members in group ~ 5 mins. Added in social interaction piece by having each pt share a goal they accomplished today with pt sharing that his goal was that he was able to DC home tomorrow. Session ended with 2 mins of guided deep breathing where pts were instructed on sequence of deep breathing and benefits of deep breathing to accommodate for stress, anxiety and relaxation. Pt transported back to room with RT.   Therapy Documentation Precautions:  Precautions Precautions: Fall Precaution Comments: tremors/shakiness in RLE, mild L hemi Restrictions Weight Bearing Restrictions: No  Pain: Pt  reports no pain during session    Therapy/Group: Group Therapy  Thomas Gardner 03/15/2021, 4:06 PM

## 2021-03-15 NOTE — Progress Notes (Signed)
Inpatient Rehabilitation Care Coordinator Discharge Note   Patient Details  Name: Thomas Gardner MRN: [id number] Date of Birth: Aug 12, 1955   Discharge location: Home  Length of Stay: 9 Days  Discharge activity level: MOD I  Home/community participation: friends  Patient response QA:ESLPNP Literacy - How often do you need to have someone help you when you read instructions, pamphlets, or other written material from your doctor or pharmacy?: Never  Patient response YY:FRTMYT Isolation - How often do you feel lonely or isolated from those around you?: Never  Services provided included: SW, Pharmacy, TR, CM, RN, SLP, OT, PT, RD, MD  Financial Services:  Financial Services Utilized: [place]  Choices offered to/list presented to: patient  Follow-up services arranged:  Outpatient    Outpatient Servicies: Neuro OP      Patient response to transportation need: Is the patient able to respond to transportation needs?: Other (comment) ([facility] [place] Application Completed. [facility] provided) In the past 12 months, has lack of transportation kept you from medical appointments or from getting medications?: Yes In the past 12 months, has lack of transportation kept you from meetings, work, or from getting things needed for daily living?: Yes    Comments (or additional information):  Patient/Family verbalized understanding of follow-up arrangements:  Yes  Individual responsible for coordination of the follow-up plan: patient  Confirmed correct DME delivered: Dyanne Iha 03/15/2021    Dyanne Iha

## 2021-03-15 NOTE — Progress Notes (Signed)
Physical Therapy Discharge Summary  Patient Details  Name: Thomas Gardner MRN: [id number] Date of Birth: [birth year]  Today's Date: 03/15/2021 PT Individual Time: 0850-0959 PT Individual Time Calculation (min): 69 min    Patient has met 10 of 10 long term goals due to improved activity tolerance, improved balance, improved postural control, and increased strength.  Patient to discharge at an ambulatory level Supervision.   Patient's care partner is independent to provide the necessary physical assistance at discharge.  Reasons goals not met: NA  Recommendation:  Patient will benefit from ongoing skilled PT services in outpatient setting to continue to advance safe functional mobility, address ongoing impairments in strength, endurance, balance, and minimize fall risk.  Equipment: RW  Reasons for discharge: treatment goals met and discharge from hospital  Patient/family agrees with progress made and goals achieved: Yes  Skilled Therapeutic Interventions: Pt received seated in recliner and agrees to therapy. No complaint of pain. Sit to stand at mod(I) with RW. Pt transfer to [facility] with RW at mod(I). WC transport to gym for time management. Pt performs car transfer and ramp navigation with verbal cues for sequencing. Following seated rest break, pt ambulates x180' with RW and cues to maintain proximity to RW for safety, increasing stride length and gait speed to decrease risk for falls, and decreasing WB through RW for energy conservation. Pt performs x12 6" steps with bilateral hand rails and cues for step sequencing for safety. Following seated rest break, pt perform curb navigation, stepping up onto 8" platform with RW, following PT demonstration. Pt performs x2 with close supervision and cues for positioning. WC transport back to room. Pt practices ambulation without AD in room, performing forward and backward gait x5' each with CGA/minA for stability and cues for increased stride length. Pt  left seated in recliner with alarm intact and all needs within reach.  PT Discharge Precautions/Restrictions Precautions Precautions: Fall Restrictions Weight Bearing Restrictions: No Pain Pain Assessment Pain Scale: 0-10 Pain Score: 0-No pain Pain Interference Pain Interference Pain Effect on Sleep: 2. Occasionally Pain Interference with Therapy Activities: 1. Rarely or not at all Pain Interference with Day-to-Day Activities: 1. Rarely or not at all Vision/Perception  Vision - History Ability to See in Adequate Light: 0 Adequate Perception Perception: Within Functional Limits Praxis Praxis: Intact  Cognition Overall Cognitive Status: Within Functional Limits for tasks assessed Arousal/Alertness: Awake/alert Orientation Level: Oriented X4 Year: 2022 Month: October Day of Week: Correct Safety/Judgment: Appears intact Sensation Sensation Light Touch: Impaired by gross assessment Proprioception: Appears Intact Stereognosis: Appears Intact Additional Comments: peripheral neuropathy BLE Motor  Motor Motor: Ataxia;Hemiplegia Motor - Skilled Clinical Observations: L sided ataxia, mild L hemiplegia Motor - Discharge Observations: Much improved from eval  Mobility Bed Mobility Supine to Sit: Independent Sit to Supine: Independent Transfers Transfers: Sit to Stand;Stand Pivot Transfers;Stand to Sit Sit to Stand: Independent with assistive device Stand to Sit: Independent with assistive device Stand Pivot Transfers: Independent with assistive device Transfer (Assistive device): Rolling walker Locomotion  Gait Ambulation: Yes Gait Assistance: Supervision/Verbal cueing Gait Distance (Feet): 180 Feet Assistive device: Rolling walker Gait Assistance Details: Verbal cues for technique;Verbal cues for gait pattern Gait Gait: Yes Gait Pattern: Impaired Gait velocity: Decreased, but improved from eval Stairs / Additional Locomotion Stairs: Yes Stairs Assistance:  Supervision/Verbal cueing Stair Management Technique: Two rails Number of Stairs: 12 Height of Stairs: 6 Ramp: Supervision/Verbal cueing Curb: Supervision/Verbal cueing Wheelchair Mobility Wheelchair Mobility: No  Trunk/Postural Assessment  Cervical Assessment Cervical Assessment: Exceptions to [facility] (  forward head) Thoracic Assessment Thoracic Assessment: Exceptions to [facility] (rounded shoulders) Lumbar Assessment Lumbar Assessment:  (posterior pelvic tilt) Postural Control Postural Control: Within Functional Limits  Balance Balance Balance Assessed: Yes Static Sitting Balance Static Sitting - Balance Support: Feet supported Static Sitting - Level of Assistance: 7: Independent Dynamic Sitting Balance Dynamic Sitting - Balance Support: Feet supported Dynamic Sitting - Level of Assistance: 7: Independent Static Standing Balance Static Standing - Balance Support: Bilateral upper extremity supported;During functional activity Static Standing - Level of Assistance: 6: Modified independent (Device/Increase time) Dynamic Standing Balance Dynamic Standing - Balance Support: Bilateral upper extremity supported;During functional activity Dynamic Standing - Level of Assistance: 6: Modified independent (Device/Increase time) Extremity Assessment  RLE Assessment General Strength Comments: grossly 4+/5 to 5/5 proximal to distal LLE Assessment LLE Assessment: Exceptions to [facility] LLE Strength Left Hip Flexion: 4/5 Left Hip ABduction: 4/5 Left Hip ADduction: 4/5 Left Knee Flexion: 4/5 Left Knee Extension: 4/5 Left Ankle Dorsiflexion: 4+/5 Left Ankle Plantar Flexion: 4+/5    Breck Coons, PT, DPT 03/15/2021, 4:35 PM

## 2021-03-15 NOTE — Progress Notes (Signed)
Patient ID: Thomas Gardner, male   DOB: [birth year], 65 y.o.   MRN: [id number]  [place] application faxed.

## 2021-03-16 ENCOUNTER — Other Ambulatory Visit (HOSPITAL_COMMUNITY): Payer: Self-pay

## 2021-03-16 LAB — GLUCOSE, CAPILLARY
Glucose-Capillary: 156 mg/dL — ABNORMAL HIGH (ref 70–99)
Glucose-Capillary: 193 mg/dL — ABNORMAL HIGH (ref 70–99)
Glucose-Capillary: 211 mg/dL — ABNORMAL HIGH (ref 70–99)

## 2021-03-16 MED ORDER — VICTOZA 18 MG/3ML ~~LOC~~ SOPN
1.8000 mg | PEN_INJECTOR | Freq: Every day | SUBCUTANEOUS | 2 refills | Status: DC
  Filled 2021-03-16: qty 9, 30d supply, fill #0

## 2021-03-16 NOTE — Progress Notes (Signed)
PROGRESS NOTE   Subjective/Complaints:  Excited about d/c friends picking him up after lunch   ROS- neg CP, SOB, N/V/D,   Objective:   No results found. No results for input(s): WBC, HGB, HCT, PLT in the last 72 hours.   No results for input(s): NA, K, CL, CO2, GLUCOSE, BUN, CREATININE, CALCIUM in the last 72 hours.    Intake/Output Summary (Last 24 hours) at 03/16/2021 0754 Last data filed at 03/15/2021 1841 Gross per 24 hour  Intake 720 ml  Output --  Net 720 ml         Physical Exam: Vital Signs Blood pressure 129/63, pulse 79, temperature 98.9 F (37.2 C), temperature source Oral, resp. rate 20, height 5\' 6"  (1.676 m), weight 109 kg, SpO2 97 %.   General: No acute distress Mood and affect are appropriate Heart: Regular rate and rhythm no rubs murmurs or extra sounds Lungs: Clear to auscultation, breathing unlabored, no rales or wheezes Abdomen: Positive bowel sounds, soft nontender to palpation, nondistended Extremities: No clubbing, cyanosis, or edema Skin: No evidence of breakdown, no evidence of rash  Neurologic: Cranial nerves II through XII intact, motor strength is 5/5 in bilateral deltoid, bicep, tricep, grip, hip flexor, knee extensors, ankle dorsiflexor and plantar flexor- unchanged   Musculoskeletal: Full range of motion in all 4 extremities. No joint swelling    Assessment/Plan: 1. Functional deficits due3 to CVA Stable for D/C today F/u PCP in 3-4 weeks- IM clinic [facility] F/u PM&R 2 weeks See D/C summary See D/C instructions   Care Tool:  Bathing    Body parts bathed by patient: Right arm, Left arm, Chest, Abdomen, Face, Front perineal area, Right upper leg, Left upper leg, Buttocks, Right lower leg, Left lower leg   Body parts bathed by helper: Buttocks, Left lower leg, Right lower leg     Bathing assist Assist Level: Independent with assistive device     Upper Body  Dressing/Undressing Upper body dressing   What is the patient wearing?: Pull over shirt    Upper body assist Assist Level: Independent    Lower Body Dressing/Undressing Lower body dressing      What is the patient wearing?: Pants, Underwear/pull up     Lower body assist Assist for lower body dressing: Independent with assitive device     Toileting Toileting    Toileting assist Assist for toileting: Independent with assistive device     Transfers Chair/bed transfer  Transfers assist     Chair/bed transfer assist level: Independent with assistive device Chair/bed transfer assistive device: Museum/gallery exhibitions officer assist      Assist level: Contact Guard/Touching assist Assistive device: Walker-rolling Max distance: 169ft   Walk 10 feet activity   Assist     Assist level: Supervision/Verbal cueing Assistive device: Walker-rolling   Walk 50 feet activity   Assist Walk 50 feet with 2 turns activity did not occur: Safety/medical concerns  Assist level: Supervision/Verbal cueing Assistive device: Walker-rolling    Walk 150 feet activity   Assist Walk 150 feet activity did not occur: Safety/medical concerns  Assist level: Supervision/Verbal cueing Assistive device: Walker-rolling    Walk 10 feet  on uneven surface  activity   Assist Walk 10 feet on uneven surfaces activity did not occur: Safety/medical concerns   Assist level: Contact Guard/Touching assist Assistive device: Walker-rolling   Wheelchair     Assist Is the patient using a wheelchair?: Yes Type of Wheelchair: Manual    Wheelchair assist level: Minimal Assistance - Patient > 75% Max wheelchair distance: 100    Wheelchair 50 feet with 2 turns activity    Assist        Assist Level: Minimal Assistance - Patient > 75%   Wheelchair 150 feet activity     Assist      Assist Level: Moderate Assistance - Patient 50 - 74%   Blood pressure  129/63, pulse 79, temperature 98.9 F (37.2 C), temperature source Oral, resp. rate 20, height 5\' 6"  (1.676 m), weight 109 kg, SpO2 97 %.  Medical Problem List and Plan: 1. 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             d/c home today        2.  Impaired mobility, ambulating >100' d/c Lovenox             -antiplatelet therapy: DAPT X 3 weeks followed by ASA alone.  3. Chronic HA/Pain Management: Continue tylenol prn.  4. Mood: LCSW to follow for evaluation and support.              -antipsychotic agents: N/a 5. Neuropsych: This patient is capable of making decisions on his own behalf. 6. Skin/Wound Care: Routine pressure relief measures. Abd ecchymosis due to lovenox will d/c as pt amb greater distances  7. Fluids/Electrolytes/Nutrition: Monitor I/Os 8. Chronic HFrEF/HTN:  On Losartan, Coreg, hygroton, Crestor and Jardiance.  -- Daily weights for signs of overload.              --has seen Dr. Einar Gip in the past.  9.T2DM: Hgb A1C- 9.9 and poorly controlled as he feels weak if BS <200.              --Changed Jardiance to 10mg  BID given side effect of vertigo. d/c metformin (he has been refusing and does not plan on taking it at home), insulin glargine  as at home and novolog tid AC. Increase Semglee to 16U. Patient has brought Victoza from home and it is being approved by pharmacy [facility] (last 3)  Recent Labs    03/15/21 1643 03/15/21 2151 03/16/21 0623  GLUCAP 143* 202* 156*   Elevated yesterday PM- pt drank cranberry juice but thought it was diet, cont to monitor Increase semglee, pt's home Victoza is expired Increase novalog to 8 U TID- f/u PCP 10.  Dyslipidemia: LDL-121. Was non-compliant with meds. Continue to educate on compliance.              --On Crestor and Zetia at home currently just on Crestor.  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 13. HTN: Now back  on  Cozaar 25mg  qd (home dose 100mg ) , hygroton 25mg  qd, Coreg 25mg  BID. --will order orthostatic vitals.   Vitals:   03/15/21 1407 03/15/21 2009  BP: 131/60 129/63  Pulse: 80 79  Resp: 18 20  Temp: 97.8 F (36.6 C) 98.9 F (37.2 C)  SpO2: 98% 97%  Controlled after increase  hygrotan to 25mg  (home dose) 14. Hypokalemia: Will add low dose supplement for recurrent hypokalemia (hygroton on board).  K+ stable and normal  15. OSA: Machine broke/lack of insurance--will need sleep study after discharge.       LOS: 9 days A FACE TO FACE EVALUATION WAS PERFORMED  Charlett Blake 03/16/2021, 7:54 AM

## 2021-03-16 NOTE — Progress Notes (Signed)
Inpatient Rehabilitation Discharge Medication Review by a Pharmacist  A complete drug regimen review was completed for this patient to identify any potential clinically significant medication issues.  High Risk Drug Classes Is patient taking? Indication by Medication  Antipsychotic No   Anticoagulant No   Antibiotic No   Opioid No   Antiplatelet Yes DAPT-Plavix/ASA- Acut. Isch infarct Left Thalamic pyramid  Hypoglycemics/insulin Yes Novolog/lantus- T2DM  Vasoactive Medication Yes Cozaar, chlorthalidone, coreg- HTN/HFrEF  Chemotherapy No   Other No      Type of Medication Issue Identified Description of Issue Recommendation(s)  Drug Interaction(s) (clinically significant)     Duplicate Therapy     Allergy     No Medication Administration End Date     Incorrect Dose     Additional Drug Therapy Needed     Significant med changes from prior encounter (inform family/care partners about these prior to discharge). New medications:  ASA/Plavix/Crestor/vitamin C Restart: albuterol/victoza Stop: metformin/insulin lispro T2DM: Patient does not want to take metformin at home. Patient states he feels weak at [facility] <200. Contacted PA regarding novolog requesting a continuation at 8 units tid w/ meals  Other       Clinically significant medication issues were identified that warrant physician communication and completion of prescribed/recommended actions by midnight of the next day:  Yes  Name of provider notified: Contacted Dr. Letta Pate via secure chat who asked that I contact Mar[place]e [place] PA. Sent secure chat and we spoke over the phone  Providers Method of Notification: secure chat and phone call    Pharmacist comments: Linna Hoff will speak with patient prior to discharge to address all three bulleted points below:  -Novolog 8 units [place] tid in house not continued for discharge -Follow-up with cardiology to address lipids -Potassium for discharge given hygroton. Potassium is 4.0 on  03/12/2021.   Time spent performing this drug regimen review (minutes):  60   Nathali Vent BS, PharmD, BCPS Clinical Pharmacist 03/15/2021 1:17 PM

## 2021-03-16 NOTE — Progress Notes (Signed)
Patient ID: Thomas Gardner, male   DOB: 09-25-1955, 65 y.o.   MRN: [id number]  This SW covering for primary SW, Erlene Quan.   SW was informed pt DME had not arrived. SW called pt in room to discuss if he made contact with [facility] about co-pays. Pt was not aware. SW received updates from [place] DME will be delivered, and they will f/u with pt to discuss co-pays. SW informed pt and medical team.   Loralee Pacas, MSW, [place] Office: [phone] Cell: [phone] Fax: [phone]

## 2021-03-19 ENCOUNTER — Telehealth: Payer: Self-pay

## 2021-03-19 NOTE — Telephone Encounter (Signed)
Received a TC from patient who states he was discharged from the hospital Friday for a 2 week stay (s/p CVA)  He states he had intermittent nausea in the hospital and was given something for it that helped.  He is still c/o intermittent nausea and would like something sent to [facility].  He has not vomitted, but did "spit out water this morning d/t nausea".  RN will send request to team/pcp.  Pt was instructed if his nausea worsens or he starts vomitting, has any s/s of a stroke that he should present to ED and he verbalized understanding.  He states he is feeling fine other than the intermittent nausea.  His HFU is scheduled for 03/22/21 SChaplin, RN,BSN

## 2021-03-19 NOTE — Discharge Summary (Signed)
Physician Discharge Summary  Patient ID: Thomas Gardner MRN: [id number] DOB/AGE: 1956-04-20 65 y.o.  Admit date: 03/07/2021 Discharge date: 03/16/2021  Discharge Diagnoses:  Principal Problem:   Acute stroke of medulla oblongata ([place]) Active Problems:   Diabetes mellitus ([place])   Essential hypertension   Hyperlipidemia   Neck mass   Discharged Condition: stable  Significant Diagnostic Studies: CT Angio Head W or Wo Contrast  Result Date: 03/02/2021 CLINICAL DATA:  Vertigo EXAM: CT ANGIOGRAPHY HEAD AND NECK TECHNIQUE: Multidetector CT imaging of the head and neck was performed using the standard protocol during bolus administration of intravenous contrast. Multiplanar CT image reconstructions and MIPs were obtained to evaluate the vascular anatomy. Carotid stenosis measurements (when applicable) are obtained utilizing NASCET criteria, using the distal internal carotid diameter as the denominator. CONTRAST:  169m OMNIPAQUE IOHEXOL 350 MG/ML SOLN COMPARISON:  None. FINDINGS: CT HEAD FINDINGS Brain: There is no mass, hemorrhage or extra-axial collection. The size and configuration of the ventricles and extra-axial CSF spaces are normal. There is hypoattenuation of the periventricular white matter, most commonly indicating chronic ischemic microangiopathy. Skull: The visualized skull base, calvarium and extracranial soft tissues are normal. Sinuses/Orbits: No fluid levels or advanced mucosal thickening of the visualized paranasal sinuses. No mastoid or middle ear effusion. The orbits are normal. CTA NECK FINDINGS SKELETON: There is no bony spinal canal stenosis. No lytic or blastic lesion. OTHER NECK: Normal pharynx, larynx and major salivary glands. No cervical lymphadenopathy. 3.6 cm heterogeneous nodule in the right lobe of the thyroid gland. UPPER CHEST: No pneumothorax or pleural effusion. No nodules or masses. AORTIC ARCH: There is calcific atherosclerosis of the aortic arch. There is no  aneurysm, dissection or hemodynamically significant stenosis of the visualized portion of the aorta. Conventional 3 vessel aortic branching pattern. The visualized proximal subclavian arteries are widely patent. RIGHT CAROTID SYSTEM: No dissection, occlusion or aneurysm. Mild atherosclerotic calcification at the carotid bifurcation without hemodynamically significant stenosis. LEFT CAROTID SYSTEM: There is mild atherosclerotic calcification at the carotid bifurcation with severe stenosis at the proximal external carotid artery. No internal carotid artery stenosis. VERTEBRAL ARTERIES: Right dominant configuration. Both origins are clearly patent. There is no dissection, occlusion or flow-limiting stenosis to the skull base (V1-V3 segments). CTA HEAD FINDINGS POSTERIOR CIRCULATION: --Vertebral arteries: Diminutive left V4 segment becomes non-opacified before reaching the vertebrobasilar confluence --Inferior cerebellar arteries: Poor visualization of left PICA. Right is normal. --Basilar artery: Normal. --Superior cerebellar arteries: Normal. --Posterior cerebral arteries (PCA): Multifocal moderate-to-severe stenosis of the right P2-P3 segments. ANTERIOR CIRCULATION: --Intracranial internal carotid arteries: Normal. --Anterior cerebral arteries (ACA): Normal. Both A1 segments are present. Patent anterior communicating artery (a-comm). --Middle cerebral arteries (MCA): Normal. VENOUS SINUSES: As permitted by contrast timing, patent. ANATOMIC VARIANTS: None Review of the MIP images confirms the above findings. IMPRESSION: 1. No emergent large vessel occlusion. 2. 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 3. Severe stenosis of the proximal left external carotid artery. 4. 3.6 cm incidental right thyroid nodule. Recommend thyroid [place] Reference: J Am Coll Radiol. 2015 Feb;12(2): 143-50 Electronically Signed   By: KUlyses JarredM.D.   On: 03/02/2021 23:15   [place] THYROID  Result Date: 03/03/2021 CLINICAL DATA:  Known thyroid nodules and midline complex cystic mass. Findings again seen on CT imaging performed recently. EXAM: THYROID ULTRASOUND TECHNIQUE: Ultrasound examination of the thyroid gland and adjacent soft tissues was performed. COMPARISON:  Prior thyroid ultrasound 02/14/2020 FINDINGS: Parenchymal Echotexture: Mildly heterogenous Isthmus: 0.8 cm Right lobe:  6.1 x 3.9 x 3.5 cm Left lobe: 5.3 x 2.4 x 2.2 cm _________________________________________________________ Estimated total number of nodules >/= 1 cm: 1 Number of spongiform nodules >/=  2 cm not described below (TR1): 0 Number of mixed cystic and solid nodules >/= 1.5 cm not described below ([place]): 0 _________________________________________________________ Nodule # 1: Prior biopsy: No Location: Right; Inferior Maximum size: 4.7 cm; Other 2 dimensions: 3.1 x 3.7 cm, previously, 4.9 x 3.5 x 3.5 cm Composition: solid/almost completely solid (2) Echogenicity: isoechoic (1) Shape: not taller-than-wide (0) Margins: ill-defined (0) Echogenic foci: none (0) ACR TI-RADS total points: 3. ACR TI-RADS risk category:  TR3 (3 points). Significant change in size (>/= 20% in two dimensions and minimal increase of 2 mm): No Change in features: No Change in ACR TI-RADS risk category: No ACR TI-RADS recommendations: **Given size (>/= 2.5 cm) and appearance, fine needle aspiration of this mildly suspicious nodule should be considered based on TI-RADS criteria. _________________________________________________________ As previously noted, there is a circumscribed complex cystic lesion in the superficial soft tissues of the midline of the neck separate from and slightly superior to the thyroid isthmus. This lesion demonstrates posterior acoustic enhancement consistent with its complex cystic nature and measures 1.8 x 1.0 x 1.9 cm. This may be slightly enlarged compared to 1.8 x 1.1 x 1.6 cm previously. IMPRESSION: 1. Perhaps  slight interval enlargement of complex cystic mass in the midline of the neck separate from and superficial to the thyroid isthmus. As described on the prior thyroid ultrasound, differential considerations include complex thyroglossal duct cyst, sebaceous/epidermoid inclusion cyst and less likely exophytic thyroid nodule. As before, referral to ENT is recommended for further assessment and management if not already obtained. 2. Stable 4.7 cm TI-RADS category 3 (mildly suspicious) mass in the right inferior thyroid gland. As before, this lesion meets criteria to consider fine-needle aspiration biopsy if not already performed. 3. No new nodules or suspicious features. The above is in keeping with the ACR TI-RADS recommendations - J Am Coll Radiol 2017;14:587-595. Electronically Signed   By: Jacqulynn Cadet M.D.   On: 03/03/2021 06:50    Labs:  Basic Metabolic Panel: BMP Latest Ref Rng & Units 03/12/2021 03/08/2021 03/07/2021  Glucose 70 - 99 mg/dL 153(H) 179(H) 151(H)  BUN 8 - 23 mg/dL 23 29(H) 23  Creatinine 0.61 - 1.24 mg/dL 1.29(H) 1.38(H) 1.18  BUN/Creat Ratio 10 - 24 - - -  Sodium 135 - 145 mmol/L 136 137 136  Potassium 3.5 - 5.1 mmol/L 4.0 3.7 3.8  Chloride 98 - 111 mmol/L 101 101 102  CO2 22 - 32 mmol/L _0 Calcium 8.9 - 10.3 mg/dL 9.1 9.1 9.2     CBC: CBC Latest Ref Rng & Units 03/12/2021 03/08/2021 03/05/2021  WBC 4.0 - 10.5 K/uL 9.6 8.5 9.7  Hemoglobin 13.0 - 17.0 g/dL 13.4 13.7 13.6  Hematocrit 39.0 - 52.0 % 41.5 42.1 42.2  Platelets 150 - 400 K/uL 205 217 210     CBG: Recent Labs  Lab 03/15/21 1643 03/15/21 2151 03/16/21 0623 03/16/21 1137 03/16/21 1719  GLUCAP 143* 202* 156* 193* 211*    Brief HPI:   KAPIL PETROPOULOS is a 65 y.o. RH-male with history of T2DM, HFrEF, HTN, OSA, HA , morbid obesity, recent oral surgery; who was admitted on 03/02/21 with reports of persistent headache, dizziness and difficulty walking with tendency to fall to the left.  UDS was  positive for opiates.  CTA head-revealing multifocal moderate to severe stenosis  of right PCA, severe stenosis proximal left-PCA, incidental 3.6 cm right thyroid nodule and was negative for LVO.  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  Stroke was felt to be due to small vessel disease and [name] recommended DAPT x3 weeks followed by aspirin alone as well as control of risk factors.   He was followed by internal medicine teaching service and thyroid ultrasound done for follow-up revealing slight interval increase in his complex cystic mass in midline of neck as well as recommendations for ENT referral.  Hospital course was complicated by labile blood pressures and home meds slowly resumed.  Patient with resultant dizziness, tremors with left knee instability as well as balance deficits affecting mobility and ADLs.  CIR was recommended due to functional decline.   Hospital Course: [facility] NED was admitted to rehab 03/07/2021 for inpatient therapies to consist of PT, ST and OT at least three hours five days a week. Past admission physiatrist, therapy team and rehab RN have worked together to provide customized collaborative inpatient rehab.  He was maintained on DAPT throughout his stay and follow-up CBC showed Hgb, WBC and platelets to be stable.  He is to continue Plavix for 9 more days and  continue aspirin indefinitely.  His blood pressures were monitored on TID basis and have been stable on current regimen of losartan, Coreg and hygroton  He was educated on importance of medication compliance.  Low-dose Kdur was added due to hypokalemia with follow-up labs showing potassium levels to be stable.  Diabetes has been monitored with ac/hs CBG checks and SSI was use prn for tighter BS control.  Jardiance was resumed at 25 mg daily and metformin was discontinued due to patient's refused to take latter. He was maintained on  insulin glargine and sliding scale insulin.  NovoLog was titrated to 8 units TID.  His Victoza was noted to be expired therefore was not used during his stay.  He was advised to refill Victoza and resume this for tighter blood sugar control.   His po intake has been good and he is continent of B/B. He has made good gains during his rehab stay and intermittent supervision is recommended for safety.  He will continue to receive outpatient PT and OT at [facility] after discharge.   Rehab course: During patient's stay in rehab weekly team conferences were held to monitor patient's progress, set goals and discuss barriers to discharge. At admission, patient required min assist with basic ADL tasks and mod assist with mobility. He  has had improvement in activity tolerance, balance, postural control as well as ability to compensate for deficits.  He was able to complete ADL tasks at modified independent level.  He was independent for transfers and requires supervision with verbal cues for ambulating 180 feet with rolling walker.   Discharge disposition: 01-Home or Self Care  Diet: Heart healthy/Diabetic. Marland Kitchen   Special Instructions: Will need referral to ENT for follow up on thyroid nodule and cystic  mass. Stroke follow up with neurology in 4 weeks.   3. Continue Plavix for 9 more days then stop. 4. Recommend repeat sleep study for work up of OSA.   Discharge Instructions     Ambulatory referral to Physical Medicine Rehab   Complete by: As directed    moderate complexity follow-up 1 to 2 weeks schemia left medullary pyramid      Allergies as of 03/16/2021  Reactions   Erythromycin Nausea And Vomiting        Medication List     STOP taking these medications    glucose monitoring kit monitoring kit   insulin lispro 100 UNIT/ML KwikPen Commonly known as: HumaLOG KwikPen   metFORMIN 500 MG 24 hr tablet Commonly known as: GLUCOPHAGE-XR   Unifine Pentips 31G X 5 MM  Misc Generic drug: Insulin Pen Needle       TAKE these medications    acetaminophen 325 MG tablet Commonly known as: TYLENOL Take 1-2 tablets (325-650 mg total) by mouth every 4 (four) hours as needed for mild pain.   albuterol 108 (90 Base) MCG/ACT inhaler Commonly known as: VENTOLIN HFA Inhale 2 puffs into the lungs every 6 (six) hours as needed for wheezing or shortness of breath. MAP pharmacy   aspirin 81 MG chewable tablet Chew 1 tablet (81 mg total) by mouth daily.   carvedilol 25 MG tablet Commonly known as: COREG Take 1 tablet (25 mg total) by mouth 2 (two) times daily with a meal.   chlorthalidone 25 MG tablet Commonly known as: HYGROTON Take 1 tablet (25 mg total) by mouth daily.   clopidogrel 75 MG tablet Commonly known as: PLAVIX Take 1 tablet (75 mg total) by mouth daily.   Jardiance 25 MG Tabs tablet Generic drug: empagliflozin Take 1 tablet (25 mg total) by mouth daily.   Lantus SoloStar 100 UNIT/ML Solostar Pen Generic drug: insulin glargine Inject 16 Units into the skin 2 (two) times daily. What changed: how much to take   losartan 25 MG tablet Commonly known as: COZAAR Take 1 tablet (25 mg total) by mouth daily. What changed:  medication strength how much to take   rosuvastatin 40 MG tablet Commonly known as: CRESTOR Take 1 tablet (40 mg total) by mouth daily.   Victoza 18 MG/3ML Sopn Generic drug: liraglutide Inject 1.8 mg into the skin daily.   vitamin C 500 MG tablet Commonly known as: ASCORBIC ACID Take 1 tablet (500 mg total) by mouth daily.   Vitamin D3 25 MCG tablet Commonly known as: Vitamin D Take 1 tablet (1,000 Units total) by mouth daily. What changed: medication strength        Follow-up Information     Kirsteins, Luanna Salk, MD Follow up.   Specialty: Physical Medicine and Rehabilitation Why: Office to call for appointment Contact information: [place] [address] [phone]          [name]. Call.   Why: for stroke follow up Contact information: [address]     [address] [place] [address] [phone]        Scarlett Presto, MD. Call.   Specialty: Internal Medicine Why: for post hospital follow up Contact information: [place] [address] [phone]                 Signed: Bary Leriche 03/19/2021, 3:20 PM

## 2021-03-20 ENCOUNTER — Other Ambulatory Visit (HOSPITAL_COMMUNITY): Payer: Self-pay

## 2021-03-22 ENCOUNTER — Ambulatory Visit (INDEPENDENT_AMBULATORY_CARE_PROVIDER_SITE_OTHER): Payer: Medicare Other | Admitting: Internal Medicine

## 2021-03-22 ENCOUNTER — Other Ambulatory Visit (HOSPITAL_COMMUNITY): Payer: Self-pay

## 2021-03-22 ENCOUNTER — Encounter: Payer: Self-pay | Admitting: Internal Medicine

## 2021-03-22 ENCOUNTER — Other Ambulatory Visit: Payer: Self-pay

## 2021-03-22 VITALS — BP 109/49 | HR 99 | Temp 98.2°F | Ht 66.0 in | Wt 239.0 lb

## 2021-03-22 DIAGNOSIS — I639 Cerebral infarction, unspecified: Secondary | ICD-10-CM | POA: Diagnosis not present

## 2021-03-22 DIAGNOSIS — E0800 Diabetes mellitus due to underlying condition with hyperosmolarity without nonketotic hyperglycemic-hyperosmolar coma (NKHHC): Secondary | ICD-10-CM | POA: Diagnosis not present

## 2021-03-22 DIAGNOSIS — R221 Localized swelling, mass and lump, neck: Secondary | ICD-10-CM | POA: Diagnosis not present

## 2021-03-22 DIAGNOSIS — Z794 Long term (current) use of insulin: Secondary | ICD-10-CM

## 2021-03-22 DIAGNOSIS — E041 Nontoxic single thyroid nodule: Secondary | ICD-10-CM

## 2021-03-22 DIAGNOSIS — E785 Hyperlipidemia, unspecified: Secondary | ICD-10-CM

## 2021-03-22 DIAGNOSIS — R1013 Epigastric pain: Secondary | ICD-10-CM

## 2021-03-22 DIAGNOSIS — E782 Mixed hyperlipidemia: Secondary | ICD-10-CM

## 2021-03-22 DIAGNOSIS — I10 Essential (primary) hypertension: Secondary | ICD-10-CM

## 2021-03-22 DIAGNOSIS — I5022 Chronic systolic (congestive) heart failure: Secondary | ICD-10-CM

## 2021-03-22 DIAGNOSIS — K219 Gastro-esophageal reflux disease without esophagitis: Secondary | ICD-10-CM

## 2021-03-22 DIAGNOSIS — I63332 Cerebral infarction due to thrombosis of left posterior cerebral artery: Secondary | ICD-10-CM

## 2021-03-22 MED ORDER — VICTOZA 18 MG/3ML ~~LOC~~ SOPN
1.8000 mg | PEN_INJECTOR | Freq: Every day | SUBCUTANEOUS | 3 refills | Status: DC
Start: 2021-03-22 — End: 2021-10-18
  Filled 2021-03-22: qty 21, 70d supply, fill #0
  Filled 2021-04-17: qty 27, 90d supply, fill #0
  Filled 2021-07-04: qty 27, 90d supply, fill #1

## 2021-03-22 MED ORDER — LOSARTAN POTASSIUM 25 MG PO TABS
25.0000 mg | ORAL_TABLET | Freq: Every day | ORAL | 1 refills | Status: DC
Start: 2021-03-22 — End: 2021-07-05
  Filled 2021-03-22 – 2021-04-16 (×2): qty 90, 90d supply, fill #0
  Filled 2021-07-04: qty 90, 90d supply, fill #1

## 2021-03-22 MED ORDER — ROSUVASTATIN CALCIUM 40 MG PO TABS
40.0000 mg | ORAL_TABLET | Freq: Every day | ORAL | 1 refills | Status: DC
  Filled 2021-03-22 – 2021-04-16 (×2): qty 90, 90d supply, fill #0

## 2021-03-22 MED ORDER — OMEPRAZOLE 40 MG PO CPDR
40.0000 mg | DELAYED_RELEASE_CAPSULE | Freq: Every day | ORAL | 0 refills | Status: DC
  Filled 2021-03-22: qty 90, 90d supply, fill #0

## 2021-03-22 MED ORDER — CHLORTHALIDONE 25 MG PO TABS
25.0000 mg | ORAL_TABLET | Freq: Every day | ORAL | 1 refills | Status: DC
  Filled 2021-03-22 – 2021-04-16 (×2): qty 90, 90d supply, fill #0
  Filled 2021-07-04: qty 90, 90d supply, fill #1

## 2021-03-22 MED ORDER — EMPAGLIFLOZIN 25 MG PO TABS
25.0000 mg | ORAL_TABLET | Freq: Every day | ORAL | 1 refills | Status: DC
  Filled 2021-03-22 – 2021-04-16 (×2): qty 90, 90d supply, fill #0
  Filled 2021-07-04: qty 90, 90d supply, fill #1

## 2021-03-22 MED ORDER — ASPIRIN 81 MG PO CHEW
81.0000 mg | CHEWABLE_TABLET | Freq: Every day | ORAL | 1 refills | Status: DC
  Filled 2021-03-22: qty 90, 90d supply, fill #0

## 2021-03-22 MED ORDER — CARVEDILOL 25 MG PO TABS
25.0000 mg | ORAL_TABLET | Freq: Two times a day (BID) | ORAL | 1 refills | Status: DC
Start: 2021-03-22 — End: 2021-12-18
  Filled 2021-03-22 – 2021-04-16 (×2): qty 180, 90d supply, fill #0
  Filled 2021-07-04: qty 180, 90d supply, fill #1

## 2021-03-22 NOTE — Patient Instructions (Addendum)
It was nice seeing you today! Thank you for choosing [facility] for your Primary Care.    Today we talked about:   I have refilled your medications for 90 day refills. The stop date for the Plavix is November 6th.  I have placed a referral for the Cardiologist at [facility].  Regarding your nausea, heart burn and acid reflux, I have sent in a medication called Omeprazole. Take 1 tablet daily before breakfast.  Diabetes: Continue to take Lantus 16 units twice daily. As long as your morning sugars are between 120 - 160, there is no need for adjustments at this time.   I will call you with the blood work results.

## 2021-03-23 ENCOUNTER — Telehealth: Payer: Self-pay | Admitting: Pharmacist

## 2021-03-23 ENCOUNTER — Other Ambulatory Visit (HOSPITAL_COMMUNITY): Payer: Self-pay

## 2021-03-23 ENCOUNTER — Encounter: Payer: Self-pay | Admitting: Internal Medicine

## 2021-03-23 DIAGNOSIS — K219 Gastro-esophageal reflux disease without esophagitis: Secondary | ICD-10-CM | POA: Insufficient documentation

## 2021-03-23 DIAGNOSIS — E041 Nontoxic single thyroid nodule: Secondary | ICD-10-CM | POA: Insufficient documentation

## 2021-03-23 DIAGNOSIS — K449 Diaphragmatic hernia without obstruction or gangrene: Secondary | ICD-10-CM | POA: Insufficient documentation

## 2021-03-23 LAB — BMP8+ANION GAP
Anion Gap: 17 mmol/L (ref 10.0–18.0)
BUN/Creatinine Ratio: 20 (ref 10–24)
BUN: 24 mg/dL (ref 8–27)
CO2: 25 mmol/L (ref 20–29)
Calcium: 10 mg/dL (ref 8.6–10.2)
Chloride: 96 mmol/L (ref 96–106)
Creatinine, Ser: 1.22 mg/dL (ref 0.76–1.27)
Glucose: 240 mg/dL — ABNORMAL HIGH (ref 70–99)
Potassium: 3.5 mmol/L (ref 3.5–5.2)
Sodium: 138 mmol/L (ref 134–144)
eGFR: 66 mL/min/{1.73_m2} (ref >59)

## 2021-03-23 NOTE — Assessment & Plan Note (Signed)
Thomas Gardner endorses a recent history of nausea and vomiting that seems to be worse in the morning.  He states he wakes up with a bad taste in his mouth that gives him nausea and he did have 1 episode of vomiting after trying to drink water.  He endorses a history of chronic acid reflux but is currently not taking anything for it at this time.  He also has a history of a hiatal hernia was wondering if this may be contributing.  He denies any history of hematemesis.  Assessment/plan: History consistent with acid reflux.  Patient does have a history of gastric ulcers but that was in the setting of NSAID use.  No concern at this time for ulcers given lack of hematemesis and hematochezia/melena.  We will start with conservative treatment using a PPI.  - Start omeprazole 40 mg daily before breakfast - Reassess at next visit

## 2021-03-23 NOTE — Telephone Encounter (Signed)
Pharmacy Transitions of Care Follow-up Telephone Call  Date of discharge: 03/07/21 (discharged from hospital) and 03/15/21 (discharged from rehab) Discharge Diagnosis: CVA (cerebral vascular accident), Acute stroke of medulla oblongata   Medication changes made at discharge (03/07/21): START taking: aspirin Start taking on: March 08, 2021 clopidogrel (PLAVIX) Start taking on: March 08, 2021 CHANGE how you take: albuterol (VENTOLIN HFA) empagliflozin (JARDIANCE) metFORMIN (GLUCOPHAGE-XR) rosuvastatin (CRESTOR) STOP taking: acetaminophen 650 MG CR tablet (TYLENOL) amoxicillin 500 MG capsule (AMOXIL) doxycycline 100 MG capsule (VIBRAMYCIN) HYDROcodone-acetaminophen 5-325 MG tablet (NORCO/VICODIN) Zetia 10 MG tablet (ezetimibe)  Medication changes made at discharge (03/15/21): START taking: acetaminophen (TYLENOL) CHANGE how you take: Lantus SoloStar (insulin glargine) losartan (COZAAR) Vitamin D3 (Vitamin D) STOP taking: glucose monitoring kit monitoring kit insulin lispro 100 UNIT/ML KwikPen (HumaLOG KwikPen) metFORMIN 500 MG 24 hr tablet (GLUCOPHAGE-XR) Unifine Pentips 31G X 5 MM Misc (Insulin Pen Needle)   Medication changes verified by the patient? Yes    Medication Accessibility:  Home Pharmacy: [facility]   Was the patient provided with refills on discharged medications? Yes from PCP   Have all prescriptions been transferred from [facility] to home pharmacy? N/A  Medication Review:  CLOPIDOGREL (PLAVIX) Clopidogrel 75 mg once daily.  - Stop date for Plavix is Nov. 6th - Reviewed potential DDIs with patient  - Advised patient of medications to avoid (NSAIDs, ASA)  - Educated that Tylenol (acetaminophen) will be the preferred analgesic to prevent risk of bleeding  - Emphasized importance of monitoring for signs and symptoms of bleeding (abnormal bruising, prolonged bleeding, nose bleeds, bleeding from gums, discolored urine, black tarry stools)   - Advised patient to alert all providers of anticoagulation therapy prior to starting a new medication or having a procedure   Follow-up Appointments:  Follow up with Charlett Blake, MD Specialty: Physical Medicine and Rehabilitation Office to call for appointment [place] [address] [phone]  Final Patient Assessment: Pt was just seen by PCP yesterday (03/22/21) and was instructed to stop clopidogrel on 03/25/21. Pt did not have any questions regarding his medication and said he has been on it before so he understands it. In the notes pt was dealing with N/V and his PCP prescribed him a PPI, omeprazole, for that. Pt said that he was satisfied with that and glad he has something to help with the N/V.

## 2021-03-23 NOTE — Assessment & Plan Note (Signed)
Patient has a history of a neck cyst that measures approximately 1.8 x 1.0 x 1.6 that has been present since at least 2021.  Per radiology, differential includes complex thyroglossal duct cyst versus epidermoid inclusion cyst versus less likely exophytic thyroid nodule.  Patient is interested in following up with ENT regarding this.  - ENT referral placed

## 2021-03-23 NOTE — Assessment & Plan Note (Signed)
LDL was evaluated at recent hospitalization for CVA and markedly elevated at 272.  Patient was started on Crestor 40 mg daily and is tolerating it well at this time with no evidence of myalgias.  We will place a referral to [facility] Care as patient may be a good candidate for a PCSK9 inhibitor if Crestor is not adequate to bring his LDL to goal.  - Referral to cardiology; consider initiation of PCSK9 inhibitor - Continue Crestor 40 mg daily - Repeat lipid panel in 6 to 8 weeks - Goal LDL less than 70

## 2021-03-23 NOTE — Assessment & Plan Note (Signed)
Thomas Gardner denies any shortness of breath, orthopnea or leg edema.  He is taking all his medications as prescribed.  Assessment/plan: Thomas Gardner has a past medical history of HFrEF with an EF of 37% in 2013 that was felt to be due to nonischemic cardiomyopathy.  During recent admission for CVA, repeat echocardiogram showed improvement in EF up to 50%.  He is currently being managed on Coreg, chlorthalidone, empagliflozin, and losartan.  He previously was following with cardiology at [facility] but was lost to follow-up due to lack of health insurance.  At this time, he is interested in reestablishing with cardiology but would prefer referral to Heart Care.  - Continue guideline directed medical therapy with carvedilol, chlorthalidone, empagliflozin, losartan - Referral to cardiology placed - Consider transitioning to [facility] in the future if there are no financial barriers

## 2021-03-23 NOTE — Progress Notes (Addendum)
   CC: Recent CVA; HFrEF with recovered EF; Thyroid nodule   HPI:  Thomas Gardner is a 65 y.o. with a PMHx as listed below who presents to the clinic for Recent CVA; HFrEF with recovered EF; Thyroid nodule .   Please see the Encounters tab for problem-based Assessment & Plan regarding status of patient's acute and chronic conditions.  Past Medical History:  Diagnosis Date   Arthritis    Asthma    CHF (congestive heart failure) ([place])    Diabetes mellitus 2003   GERD (gastroesophageal reflux disease)    Hyperlipidemia    Hypertension    Morbid obesity ([place])    Sleep apnea    Review of Systems: Review of Systems  Constitutional:  Positive for malaise/fatigue (x1 day; resolved). Negative for chills, fever and weight loss.  HENT:  Negative for congestion and sore throat.   Respiratory:  Negative for cough, shortness of breath and wheezing.   Cardiovascular:  Negative for chest pain, palpitations and leg swelling.  Gastrointestinal:  Positive for abdominal pain, nausea (x1 day; resolved) and vomiting. Negative for constipation and diarrhea.  Skin:  Negative for itching and rash.       + bruise  Neurological:  Negative for dizziness and headaches.   Physical Exam:  Vitals:   03/22/21 1342  BP: (!) 109/49  Pulse: 99  Temp: 98.2 F (36.8 C)  TempSrc: Oral  SpO2: 99%  Weight: 239 lb (108.4 kg)  Height: 5\' 6"  (1.676 m)   Physical Exam Vitals and nursing note reviewed.  Constitutional:      General: He is not in acute distress.    Appearance: He is obese.  HENT:     Head: Normocephalic and atraumatic.  Eyes:     Extraocular Movements: Extraocular movements intact.     Conjunctiva/sclera: Conjunctivae normal.     Pupils: Pupils are equal, round, and reactive to light.  Cardiovascular:     Rate and Rhythm: Normal rate and regular rhythm.     Heart sounds: No murmur heard.   No gallop.  Pulmonary:     Effort: Pulmonary effort is normal. No respiratory distress.      Breath sounds: Normal breath sounds. No wheezing, rhonchi or rales.  Abdominal:     General: Bowel sounds are normal. There is no distension.     Palpations: Abdomen is soft.     Tenderness: There is abdominal tenderness. There is no guarding.     Comments: Point tenderness overlying epigastric mass. Measures 3x3cm, hard, non-mobile. No active bowel sounds overlying the mass.   Musculoskeletal:     Cervical back: Neck supple.     Right lower leg: No edema.     Left lower leg: No edema.  Skin:    General: Skin is warm and dry.     Findings: Bruising (left arm, large bruise healing well) present.  Neurological:     Mental Status: He is alert and oriented to person, place, and time. Mental status is at baseline.  Psychiatric:        Mood and Affect: Mood normal.        Behavior: Behavior normal.        Thought Content: Thought content normal.        Judgment: Judgment normal.    Assessment & Plan:   See Encounters Tab for problem based charting.  Patient discussed with Dr. Heber Lester

## 2021-03-23 NOTE — Assessment & Plan Note (Addendum)
Patient has a history of a thyroid nodule dating back to 2021.  He denies any heat or cold intolerance, weight gain or loss, skin changes.  He denies any previous history of thyroid dysfunction nor family history of thyroid dysfunction or cancer.  Assessment/plan: Recent imaging showed stable 4.7 cm TI-RADS category 3 mass in the right inferior thyroid gland.  He meets criteria for FNA biopsy.  TSH, T4 and T3 are all within normal limits.  -FNA biopsy ordered  ADDENDUM:  FNA biopsy results with atypia of undetermined significance. Sample sent for Afirma genomic sequencing; results show benign pattern. Recommend repeat ultrasound in 12-24 months. Patient informed of results.

## 2021-03-23 NOTE — Assessment & Plan Note (Signed)
Lab Results  Component Value Date   HGBA1C 9.9 (H) 03/03/2021   Thomas Gardner states that while staying at [name], his insulin regimen was decreased from both short acting and long-acting insulin to only long-acting insulin.  In addition, the dose was significantly decreased to 16 units twice daily.  He is concerned that this may lead to a worsening in his diabetes.  He is checking his CBGs daily but does not have his glucometer on him today.  He notes that his morning CBGs are between 120 and 140.  He continues to take Victoza daily without any difficulty.  Assessment/plan: Reassured patient that based off his morning CBGs, his current insulin dose appears appropriate.  Discussed that should his CBGs begin to rise above 180, to contact the clinic.  - Continue Lantus 16 units twice daily - Continue Victoza 1.8 mg daily - Repeat A1c due January 2023

## 2021-03-23 NOTE — Assessment & Plan Note (Signed)
Thomas Gardner states he has a long-term history of a tender mass in his epigastric region that has been present for at least 15 years.  He states that has been imaged repeatedly and no significant findings have been noted.  Assessment/plan: Had a visit in September 2021, point-of-care ultrasound was performed that showed hypoechoic fluid.  On examination today the mass is approximately 3 x 3 cm and tender to palpation.  Given its chronic nature, I highly doubt that this is a malignant process.  We will continue to monitor closely.

## 2021-03-23 NOTE — Assessment & Plan Note (Addendum)
BP: (!) 109/49  Thomas Gardner denies any dizziness, chest pain, shortness of breath at this time.  He is taking all his medications without any difficulty.  Assessment/plan: Blood pressure at goal at this time.  Continue with current management that includes carvedilol, chlorthalidone, and losartan.

## 2021-03-23 NOTE — Assessment & Plan Note (Signed)
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  On presentation, patient endorsed headache and vertigo.  Medical management was initiated with DAPT. Once medically stabilized, he went to the [facility] rehab.  He was discharged home on 03/16/2021.   Interval history: Thomas Gardner states that he is doing much better today.  He did have 1 day of nausea and vomiting but this has since resolved.  He feels his stay at [facility] was very helpful and he feels his strength is improving.  He is tolerating DAPT well.  Assessment/plan: - Neurology follow-up scheduled for 04/26/2021 - Continue Plavix until 03/25/2021 - Continue aspirin indefinitely - Continue with risk management regarding hypertension, hyperlipidemia, and diabetes

## 2021-03-27 ENCOUNTER — Encounter: Payer: Medicare Other | Attending: Registered Nurse | Admitting: Registered Nurse

## 2021-03-28 ENCOUNTER — Ambulatory Visit: Payer: Medicare Other | Attending: Physician Assistant | Admitting: Occupational Therapy

## 2021-03-28 ENCOUNTER — Ambulatory Visit: Payer: Medicare Other

## 2021-03-28 ENCOUNTER — Other Ambulatory Visit: Payer: Self-pay

## 2021-03-28 DIAGNOSIS — R2681 Unsteadiness on feet: Secondary | ICD-10-CM | POA: Diagnosis not present

## 2021-03-28 DIAGNOSIS — R2689 Other abnormalities of gait and mobility: Secondary | ICD-10-CM | POA: Insufficient documentation

## 2021-03-28 DIAGNOSIS — R42 Dizziness and giddiness: Secondary | ICD-10-CM | POA: Insufficient documentation

## 2021-03-28 DIAGNOSIS — I639 Cerebral infarction, unspecified: Secondary | ICD-10-CM | POA: Diagnosis present

## 2021-03-28 DIAGNOSIS — M6281 Muscle weakness (generalized): Secondary | ICD-10-CM | POA: Insufficient documentation

## 2021-03-28 NOTE — Therapy (Signed)
[facility] [address] [place], [place], [address] Phone: [phone]   Fax:  [phone]  Occupational Therapy Evaluation/One Time Visit  Patient Details  Name: Thomas Gardner MRN: [id number] Date of Birth: [birth year] Referring Provider (OT): [place] - follow up with Frann Rider   Encounter Date: 03/28/2021   OT End of Session - 03/28/21 1339     Visit Number 1    Authorization Type UHC    Authorization Time Period $30 copay [id number]    OT Start Time 1318    OT Stop Time 1345    OT Time Calculation (min) 27 min    Activity Tolerance Patient tolerated treatment well    Behavior During Therapy 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   Arthritis    Asthma    CHF (congestive heart failure) ([place])    Diabetes mellitus 2003   GERD (gastroesophageal reflux disease)    Hyperlipidemia    Hypertension    Morbid obesity ([place])    Sleep apnea     Past Surgical History:  Procedure Laterality Date   BREATH TEK H PYLORI  11/11/2011   Procedure: BREATH TEK H PYLORI;  Surgeon: Shann Medal, MD;  Location: [facility];  Service: General;  Laterality: N/A;   CARDIAC CATHETERIZATION     ESOPHAGOGASTRODUODENOSCOPY (EGD) WITH PROPOFOL N/A 05/04/2014   Procedure: ESOPHAGOGASTRODUODENOSCOPY (EGD) WITH PROPOFOL;  Surgeon: Cleotis Nipper, MD;  Location: [facility] ENDOSCOPY;  Service: Endoscopy;  Laterality: N/A;    There were no vitals filed for this visit.   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 Pt with significant PMH for HFrEF (EF 35%), uncontrolled type 2 diabetes, HTN, HLD. Pt reports primary concern is he wants to get back to driving and being more confident with himself. Pt was independent prior to CVA and  living alone. Pt is now staying with a friend until he fully recovers and is not yet back to work or driving. Pt would like to return back to work and says his work is understanding of any accommodations he may need for fatigue.    Pertinent History HFrEF (EF 35%), uncontrolled type 2 diabetes, HTN, HLD    Limitations Fall Risk, Not Driving    Patient Stated Goals return to driving and be more confident    Currently in Pain? No/denies    Pain Score 0-No pain               [facility] OT Assessment - 03/28/21 1324       Assessment   Medical Diagnosis CVA    Referring Provider (OT) [place] - follow up with Frann Rider    Onset Date/Surgical Date 03/01/21    Hand Dominance Right    Prior Therapy [facility]      Precautions   Precautions Fall      Restrictions   Weight Bearing Restrictions No      Balance Screen   Has the patient fallen in the past 6 months No      Home  Environment   Family/patient expects to be discharged to: Private residence    Living Arrangements Alone    Available Help at Discharge Other (Comment)   possibly  Type of [place] One level    Bathroom Shower/Tub Tub/Shower unit    Home Equipment Tub bench;Grab bars - tub/shower;Cane - single point;Walker - 2 wheels      Prior Function   Level of Independence Independent    Vocation Part time employment;Full time employment   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    Leisure travel      ADL   Eating/Feeding Modified independent    Grooming Modified independent    Upper Body Bathing Modified independent    Lower Body Bathing Modified independent    Upper Body Dressing Independent    Lower Body Dressing Modified independent    Toilet Transfer Modified independent    [place] independent    [place] Transfer Modified independent      IADL   Prior Level of [place]  independent    [place] independently for small purchases;Takes care of all shopping needs independently   needs transportation and was fatigued but completed   Prior Level of Function Light Housekeeping independent    Light Housekeeping Does personal laundry completely;[place] small items, rinses stockings, etc.;Maintains house alone or with occasional assistance    Prior Level of Function Meal Prep independent    Meal Prep Does not utilize stove or oven   uses microwave currently   Prior Level of Function Scientist, research (physical sciences) Relies on family or friends for transportation    Prior Level of Function Medication Managment independent    Medication Management Is responsible for taking medication in correct dosages at correct time    Prior Level of Function Therapist, sports financial matters independently (budgets, writes checks, pays rent, bills goes to bank), collects and keeps track of income      Written Expression   Dominant Hand Right      Vision - History   Baseline Vision Wears glasses only for reading    Additional Comments reports no changes      Cognition   Overall Cognitive Status Within Functional Limits for tasks assessed    Attention Focused;Sustained;Alternating   Trail Making A and B WFL - one error on B     Observation/Other Assessments   Focus on Therapeutic Outcomes (FOTO)  was not captured at eval by front desk      Sensation   Light Touch Appears Intact    Hot/Cold Appears Intact      Coordination   9 Hole Peg Test Right;Left    Right 9 Hole Peg Test 23.63s    Left 9 Hole Peg Test 22.57s      ROM / Strength   AROM / PROM / Strength Strength;AROM      AROM   Overall AROM  Within functional limits for tasks performed      Strength   Overall Strength Within functional limits for tasks performed      Hand Function   Right Hand Gross Grasp Functional    Right Hand Grip  (lbs) 57.3    Left Hand Gross Grasp Functional    Left Hand Grip (lbs) 56.6  Plan - 03/28/21 1431     Clinical Impression Statement Pt is a 65 year old male that presents to Neuro OPOT s/p 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 Pt with significant PMH for HFrEF (EF 35%), uncontrolled type 2 diabetes, HTN, HLD. Pt presents with unsteadiness on feet but denies any vision changes and did not present with deficits with grip strength, ROM or coordination. Pt presents with intact attention and cognitive skills. Skilled occupational therapy is not recommended at this time.    OT Occupational Profile and History Problem Focused Assessment - Including review of records relating to presenting problem    Clinical Decision Making Limited treatment options, no task modification necessary    Comorbidities Affecting Occupational Performance: None    Modification or Assistance to Complete Evaluation  No modification of tasks or assist necessary to complete eval    OT Frequency One time visit    Plan one time visit    Consulted and Agree with Plan of Care Patient             Patient will benefit from skilled therapeutic intervention in order to improve the following deficits and impairments:           Visit Diagnosis: Unsteadiness on feet    Problem List Patient Active Problem List   Diagnosis Date Noted   Thyroid nodule 03/23/2021   Hiatal hernia 03/23/2021   GERD (gastroesophageal reflux disease) 03/23/2021   CVA (cerebral vascular accident) ([place]) 03/03/2021   Post-nasal drip 08/18/2020   Fatigue 04/26/2020   Epidermal inclusion cyst 04/03/2020   Generalized anxiety disorder 03/27/2020   Neck mass 01/19/2020   Epigastric pain 04/20/2019   Diabetes mellitus due to underlying condition with hyperosmolarity without coma, with long-term current use of insulin ([place]) 04/19/2019   History of phimosis of penis  04/07/2018   Sciatica associated with disorder of lumbosacral spine 08/07/2016   Sleep apnea 02/01/2016   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 [place], [place], [address] Phone: [phone]   Fax:  [phone]  Name: HATIM HOMANN MRN: [id number] Date of Birth: 07-31-1955

## 2021-03-28 NOTE — Therapy (Signed)
[facility] [address] [place], [place], [address] Phone: [phone]   Fax:  [phone]  Physical Therapy Evaluation  Patient Details  Name: Thomas Gardner MRN: [id number] Date of Birth: 16-Apr-1956 Referring Provider (PT): Jethro Bolus, PA-C   Encounter Date: 03/28/2021   PT End of Session - 03/28/21 1430     Visit Number 1    Number of Visits 17    Date for PT Re-Evaluation 05/23/21    Authorization Type UHC, $30 copay    PT Start Time 1230    PT Stop Time 1315    PT Time Calculation (min) 45 min    Equipment Utilized During Treatment Gait belt    Activity Tolerance Patient tolerated treatment well    Behavior During Therapy [facility] for tasks assessed/performed             Past Medical History:  Diagnosis Date   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   Arthritis    Asthma    CHF (congestive heart failure) ([place])    Diabetes mellitus 2003   GERD (gastroesophageal reflux disease)    Hyperlipidemia    Hypertension    Morbid obesity ([place])    Sleep apnea     Past Surgical History:  Procedure Laterality Date   BREATH TEK H PYLORI  11/11/2011   Procedure: BREATH TEK H PYLORI;  Surgeon: Shann Medal, MD;  Location: [facility];  Service: General;  Laterality: N/A;   CARDIAC CATHETERIZATION     ESOPHAGOGASTRODUODENOSCOPY (EGD) WITH PROPOFOL N/A 05/04/2014   Procedure: ESOPHAGOGASTRODUODENOSCOPY (EGD) WITH PROPOFOL;  Surgeon: Cleotis Nipper, MD;  Location: [facility] END[facility]COPY;  Service: Endoscopy;  Laterality: N/A;    There were no vitals filed for this visit.    Subjective Assessment - 03/28/21 1246     Subjective Admitted to hospital for dizziness symptoms on 03/02/21. 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 Transferred to [facility] on 03/07/21. Was discharged home 03/16/21. Pt is still getting some intermittent dizziness and n/v symptoms since he came home. He is using cane  more in home and community ambulation. Pt was college professor and Associate Professor. He reports he went to grocery shopping and used go cart but got dizzy so he drove it slowly. Pt reports that he felt motion sensitivity with the driving around in the car as well.    Pertinent History bil knee pain (meniscus injuries but no surgeries); recent stroke,    Limitations Walking;House hold activities    How long can you sit comfortably? no issues    How long can you stand comfortably? no issues    Currently in Pain? No/denies    Pain Onset 1 to 4 weeks ago                [facility] PT Assessment - 03/28/21 1250       Assessment   Medical Diagnosis CVA/dizziness    Referring Provider (PT) Jethro Bolus, PA-C    Onset Date/Surgical Date 03/01/21    Prior Therapy [facility]      Precautions   Precautions None      Restrictions   Weight Bearing Restrictions No      Balance Screen   Has the patient fallen in the past 6 months No    Is the patient reluctant to leave their home because of a fear of falling?  No      Prior Function   Level of Independence Independent  Vocation Other (comment)   Not working right now.     Cognition   Overall Cognitive Status Within Functional Limits for tasks assessed      Ambulation/Gait   Ambulation/Gait Yes    Ambulation/Gait Assistance 7: Independent    Ambulation Distance (Feet) 230 Feet    Gait Pattern Decreased arm swing - left;Antalgic;Wide base of support;Poor foot clearance - left    Ambulation Surface Level      Balance   Balance Assessed Yes      Static Standing Balance   Static Standing - Comment/# of Minutes Modified CTSIB: position 1: 30 sec, position 2: 20 sec, POsition 3: 30 sec, position 4: 3 sec   83s/120 s     Standardized Balance Assessment   Standardized Balance Assessment Five Times Sit to Stand;10 meter walk test    Five times sit to stand comments  17 sec    10 Meter Walk 0.83 m/s without AD      Functional Gait   Assessment   Gait assessed  Yes    Gait Level Surface Walks 20 ft, slow speed, abnormal gait pattern, evidence for imbalance or deviates 10-15 in outside of the 12 in walkway width. Requires more than 7 sec to ambulate 20 ft.    Change in Gait Speed Makes only minor adjustments to walking speed, or accomplishes a change in speed with significant gait deviations, deviates 10-15 in outside the 12 in walkway width, or changes speed but loses balance but is able to recover and continue walking.    Gait with Horizontal Head Turns Performs head turns smoothly with slight change in gait velocity (eg, minor disruption to smooth gait path), deviates 6-10 in outside 12 in walkway width, or uses an assistive device.    Gait with Vertical Head Turns Performs task with moderate change in gait velocity, slows down, deviates 10-15 in outside 12 in walkway width but recovers, can continue to walk.    Gait and Pivot Turn Pivot turns safely within 3 sec and stops quickly with no loss of balance.    Step Over Obstacle Is able to step over 2 stacked shoe boxes taped together (9 in total height) without changing gait speed. No evidence of imbalance.    Gait with Narrow Base of Support Ambulates less than 4 steps heel to toe or cannot perform without assistance.    Gait with Eyes Closed Cannot walk 20 ft without assistance, severe gait deviations or imbalance, deviates greater than 15 in outside 12 in walkway width or will not attempt task.    Ambulating Backwards Walks 20 ft, slow speed, abnormal gait pattern, evidence for imbalance, deviates 10-15 in outside 12 in walkway width.    Steps Two feet to a stair, must use rail.    Total Score 13    FGA comment: 13/30                        Objective measurements completed on examination: See above findings.                  PT Short Term Goals - 03/28/21 1423       PT SHORT TERM GOAL #1   Title Patient will be able to drive without  significant issues of dizziness or motion sensitivity (all goals to be met by 04/25/21)    Baseline Has not driven since stroke (eval)    Time 4    Period Weeks  Status New    Target Date 04/25/21      PT SHORT TERM GOAL #2   Title Pt will be able to ambulate without st. cane in community to improve community ambulation    Baseline pt uses st. cane for community and no cane at home (eval)    Time 4    Period Weeks    Status New               PT Long Term Goals - 03/28/21 1424       PT LONG TERM GOAL #1   Title Pt will demo >20/30 on FGA to improve overall functional gait and balance and mobility (all LTG due by 05/23/21)    Baseline 13/30 (eval)    Time 8    Period Weeks    Status New    Target Date 05/23/21      PT LONG TERM GOAL #2   Title Pt will be able to perform 5x sit to stand without HHA <12 seconds to improve functional strength with transfers    Baseline 17 sec (eval)    Time 8    Period Weeks    Status New      PT LONG TERM GOAL #3   Title Pt will demo at least 110/120 sec on Modified CTSIB to improve balance and proprioception with stading balance    Baseline 83/120 sec (eval)    Time 8    Period Weeks    Status New      PT LONG TERM GOAL #4   Title Pt will demo >1 m/s gait speed to improve community ambulation    Baseline 0.65ms without AD    Time 8    Period Weeks    Status New                    Plan - 03/28/21 1427     Clinical Impression Statement Pt is a 65y.o. male who was seen today for physical therapy evaluation and treatment for gait and balance disorder and decreased mobility after recent stroke. Patient demonstrates decreased gait speed, gait abnormality with dynamic activities, decreased fucntional strength and decreased balance and proprioception. Patient will benefit from skilled PT to address these impairments and improve overall function to return back to PLOF.    Personal Factors and Comorbidities Comorbidity  2;Past/Current Experience;Time since onset of injury/illness/exacerbation;Transportation    Comorbidities PMH significant for DM2, HLD, HTN, Morbid obesity, OSA    Examination-Activity Limitations Carry;Bend;Lift;Stairs;Stand;Transfers;Squat    Examination-Participation Restrictions Cleaning;Community Activity;Driving;Laundry;Meal Prep;Occupation;Shop;Yard Work    Stability/Clinical Decision Making Stable/Uncomplicated    Clinical Decision Making Low    Rehab Potential Good    PT Frequency 2x / week    PT Duration 8 weeks    PT Treatment/Interventions ADLs/Self Care Home Management;Canalith Repostioning;Cryotherapy;Moist Heat;Gait training;Stair training;Functional mobility training;Therapeutic activities;Therapeutic exercise;Balance training;Manual techniques;Orthotic Fit/Training;Patient/family education;Neuromuscular re-education;Passive range of motion;Energy conservation;Vestibular;Visual/perceptual remediation/compensation;Joint Manipulations    PT Next Visit Plan Perform vestibular assessment    Consulted and Agree with Plan of Care Patient             Patient will benefit from skilled therapeutic intervention in order to improve the following deficits and impairments:  Abnormal gait, Decreased activity tolerance, Decreased balance, Decreased mobility, Decreased endurance, Decreased strength, Difficulty walking, Dizziness, Postural dysfunction  Visit Diagnosis: Acute stroke of medulla oblongata ([facility])  Other abnormalities of gait and mobility  Muscle weakness (generalized)  Dizziness and giddiness     Problem  List Patient Active Problem List   Diagnosis Date Noted   Thyroid nodule 03/23/2021   Hiatal hernia 03/23/2021   GERD (gastroesophageal reflux disease) 03/23/2021   CVA (cerebral vascular accident) ([place]) 03/03/2021   Post-nasal drip 08/18/2020   Fatigue 04/26/2020   Epidermal inclusion cyst 04/03/2020   Generalized anxiety disorder 03/27/2020   Neck mass  01/19/2020   Epigastric pain 04/20/2019   Diabetes mellitus due to underlying condition with hyperosmolarity without coma, with long-term current use of insulin ([place]) 04/19/2019   History of phimosis of penis 04/07/2018   Sciatica associated with disorder of lumbosacral spine 08/07/2016   Sleep apnea 02/01/2016   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   Healthcare maintenance 03/17/2015   Gout 08/08/2014   Hyperlipidemia 06/15/2014   Chronic HFrEF (heart failure with reduced ejection fraction) ([place]) 05/03/2014   Diabetes mellitus ([place]) 05/03/2014   Essential hypertension 05/03/2014   Morbid obesity ([place]) 10/10/2011    Kerrie Pleasure, PT 03/28/2021, 2:31 PM  [facility] [address] [place], [place], [address] Phone: [phone]   Fax:  [phone]  Name: Thomas Gardner MRN: [id number] Date of Birth: 29-Aug-1955

## 2021-03-30 NOTE — Progress Notes (Signed)
Internal Medicine Clinic Attending  Case discussed with Dr. Basaraba  At the time of the visit.  We reviewed the resident's history and exam and pertinent patient test results.  I agree with the assessment, diagnosis, and plan of care documented in the resident's note.  

## 2021-04-02 ENCOUNTER — Ambulatory Visit: Payer: Medicare Other

## 2021-04-02 ENCOUNTER — Other Ambulatory Visit (HOSPITAL_COMMUNITY): Payer: Self-pay

## 2021-04-02 ENCOUNTER — Other Ambulatory Visit: Payer: Self-pay

## 2021-04-02 VITALS — BP 138/84

## 2021-04-02 DIAGNOSIS — R42 Dizziness and giddiness: Secondary | ICD-10-CM

## 2021-04-02 DIAGNOSIS — R2681 Unsteadiness on feet: Secondary | ICD-10-CM

## 2021-04-02 DIAGNOSIS — R2689 Other abnormalities of gait and mobility: Secondary | ICD-10-CM

## 2021-04-02 DIAGNOSIS — M6281 Muscle weakness (generalized): Secondary | ICD-10-CM

## 2021-04-02 NOTE — Therapy (Signed)
[facility] [address] [place], [place], [address] Phone: [phone]   Fax:  [phone]  Physical Therapy Treatment  Patient Details  Name: Thomas Gardner MRN: [id number] Date of Birth: 12-10-1955 Referring Provider (PT): Jethro Bolus, PA-C   Encounter Date: 04/02/2021   PT End of Session - 04/02/21 1401     Visit Number 2    Number of Visits 17    Date for PT Re-Evaluation 05/23/21    Authorization Type UHC, $30 copay    PT Start Time 1401    PT Stop Time 1448    PT Time Calculation (min) 47 min    Equipment Utilized During Treatment Gait belt    Activity Tolerance Patient tolerated treatment well    Behavior During Therapy [facility] for tasks assessed/performed             Past Medical History:  Diagnosis Date   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   Arthritis    Asthma    CHF (congestive heart failure) ([place])    Diabetes mellitus 2003   GERD (gastroesophageal reflux disease)    Hyperlipidemia    Hypertension    Morbid obesity ([place])    Sleep apnea     Past Surgical History:  Procedure Laterality Date   BREATH TEK H PYLORI  11/11/2011   Procedure: BREATH TEK H PYLORI;  Surgeon: Shann Medal, MD;  Location: [facility];  Service: General;  Laterality: N/A;   CARDIAC CATHETERIZATION     ESOPHAGOGASTRODUODENOSCOPY (EGD) WITH PROPOFOL N/A 05/04/2014   Procedure: ESOPHAGOGASTRODUODENOSCOPY (EGD) WITH PROPOFOL;  Surgeon: Cleotis Nipper, MD;  Location[facility];  Service: Endoscopy;  Laterality: N/A;    Vitals:   04/02/21 1412  BP: 138/84     Subjective Assessment - 04/02/21 1404     Subjective Patient reports that he is still experiencing the nausea, in the mornings specifically noted when he lays back down. Has had some mild spinning sensation. Reports that the dizziness is usually brief <5 minutes. Reports has tried walking some without the cane in the house.    Pertinent  History bil knee pain (meniscus injuries but no surgeries); recent stroke    Limitations Walking;House hold activities    How long can you sit comfortably? no issues    How long can you stand comfortably? no issues    Patient Stated Goals resolve dizziness    Currently in Pain? No/denies    Pain Onset 1 to 4 weeks ago                     Vestibular Assessment - 04/02/21 0001       Symptom Behavior   Subjective history of current problem reports some nausea that experiences in the morning, denies significant vertigo/dizziness. has only had brief episode of spinning    Type of Dizziness  Comment;Imbalance;Blurred vision   Nausea   Frequency of Dizziness daily    Duration of Dizziness minutes    Symptom Nature Motion provoked;Positional    Aggravating Factors Mornings    Relieving Factors Rest;Head stationary    Progression of Symptoms No change since onset    History of similar episodes has history of orthostatic hypotension      Oculomotor Exam   Oculomotor Alignment Normal    Ocular ROM WNL    Spontaneous Absent    Smooth Pursuits Intact   reports mild difficulty with smooth pursuit to the L   Saccades  Intact   mild sensation of difficulty focusing with saccades to L     Oculomotor Exam-Fixation Suppressed    Left Head Impulse Negative    Right Head Impulse Negative      Vestibulo-Ocular Reflex   VOR 1 Head Only (x 1 viewing) Difficulty maintaining focus. No Dizziness    VOR to Slow Head Movement Normal    VOR Cancellation Normal      Visual Acuity   Static 10    Dynamic 9   no dizziness, quivering/shakiness in leg after completion. CGA     Positional Sensitivities   Sit to Supine No dizziness    Supine to Left Side No dizziness    Supine to Right Side No dizziness    Supine to Sitting No dizziness    Right Hallpike No dizziness    Up from Right Hallpike No dizziness    Up from Left Hallpike No dizziness    Nose to Right Knee No dizziness    Right Knee  to Sitting Lightedness    Nose to Left Knee No dizziness    Left Knee to Sitting Lightheadedness    Head Turning x 5 No dizziness   mild blurry vision   Head Nodding x 5 No dizziness    Pivot Right in Standing No dizziness    Pivot Left in Standing No dizziness    Rolling Right No dizziness    Rolling Left No dizziness              [facility] Adult PT Treatment/Exercise - 04/02/21 0001       Ambulation/Gait   Ambulation/Gait Yes    Ambulation/Gait Assistance 5: Supervision    Ambulation/Gait Assistance Details ambulation throughout therapy session with activities                 Balance Exercises - 04/02/21 0001       Balance Exercises: Standing   Standing Eyes Opened Foam/compliant surface;Wide (BOA);Limitations    Standing Eyes Opened Limitations x 10 reps horiz/vertical on airex, educated on proper completion at home.    Standing Eyes Closed Wide (BOA);Foam/compliant surface;Narrow base of support (BOS);Solid surface;3 reps;30 secs;Limitations    Standing Eyes Closed Limitations trialed wide BOS on airex, increased sway x 30 seconds. for safety, completed narrow BOS on firm surface. added to HEP.            Access Code: [email or web address] URL: [email or web address] Date: 04/02/2021 Prepared by: Baldomero Lamy   Exercises Standing with Head Rotation on Pillow - 1 x daily - 5 x weekly - 2 sets - 10 reps Standing with Head Nod on Pillow - 1 x daily - 5 x weekly - 2 sets - 10 reps Standing Near Stance in [place] with Eyes Closed - 1 x daily - 5 x weekly - 1 sets - 4 reps - 15 seconds hold    PT Education - 04/02/21 1449     Education Details initial HEP; central vertigo/nausea post CVA    Person(s) Educated Patient    Methods Explanation    Comprehension Verbalized understanding              PT Short Term Goals - 03/28/21 1423       PT SHORT TERM GOAL #1   Title Patient will be able to drive without significant issues of dizziness or motion  sensitivity (all goals to be met by 04/25/21)    Baseline Has not driven since stroke (eval)    Time 4  Period Weeks    Status New    Target Date 04/25/21      PT SHORT TERM GOAL #2   Title Pt will be able to ambulate without st. cane in community to improve community ambulation    Baseline pt uses st. cane for community and no cane at home (eval)    Time 4    Period Weeks    Status New               PT Long Term Goals - 03/28/21 1424       PT LONG TERM GOAL #1   Title Pt will demo >20/30 on FGA to improve overall functional gait and balance and mobility (all LTG due by 05/23/21)    Baseline 13/30 (eval)    Time 8    Period Weeks    Status New    Target Date 05/23/21      PT LONG TERM GOAL #2   Title Pt will be able to perform 5x sit to stand without HHA <12 seconds to improve functional strength with transfers    Baseline 17 sec (eval)    Time 8    Period Weeks    Status New      PT LONG TERM GOAL #3   Title Pt will demo at least 110/120 sec on Modified CTSIB to improve balance and proprioception with stading balance    Baseline 83/120 sec (eval)    Time 8    Period Weeks    Status New      PT LONG TERM GOAL #4   Title Pt will demo >1 m/s gait speed to improve community ambulation    Baseline 0.74ms without AD    Time 8    Period Weeks    Status New                   Plan - 04/02/21 1813     Clinical Impression Statement Today's skilled PT session included further vestibular assesment with no significant findings noted. Patient did have some lightheadedness on MSQ, but reports history of orthostatic hypotension. PT educating on potenital central vertigo/nausea due to recent CVA. rest of session spent establishing initial HEP focused on balance with eyes closed and complaint surfaces. will continue per POC.    Personal Factors and Comorbidities Comorbidity 2;Past/Current Experience;Time since onset of injury/illness/exacerbation;Transportation     Comorbidities PMH significant for DM2, HLD, HTN, Morbid obesity, OSA    Examination-Activity Limitations Carry;Bend;Lift;Stairs;Stand;Transfers;Squat    Examination-Participation Restrictions Cleaning;Community Activity;Driving;Laundry;Meal Prep;Occupation;Shop;Yard Work    Stability/Clinical Decision Making Stable/Uncomplicated    Rehab Potential Good    PT Frequency 2x / week    PT Duration 8 weeks    PT Treatment/Interventions ADLs/Self Care Home Management;Canalith Repostioning;Cryotherapy;Moist Heat;Gait training;Stair training;Functional mobility training;Therapeutic activities;Therapeutic exercise;Balance training;Manual techniques;Orthotic Fit/Training;Patient/family education;Neuromuscular re-education;Passive range of motion;Energy conservation;Vestibular;Visual/perceptual remediation/compensation;Joint Manipulations    PT Next Visit Plan How was HEP? Continue high level balance, dyanmic gait. eyes closed activities.    Consulted and Agree with Plan of Care Patient             Patient will benefit from skilled therapeutic intervention in order to improve the following deficits and impairments:  Abnormal gait, Decreased activity tolerance, Decreased balance, Decreased mobility, Decreased endurance, Decreased strength, Difficulty walking, Dizziness, Postural dysfunction  Visit Diagnosis: Unsteadiness on feet  Other abnormalities of gait and mobility  Muscle weakness (generalized)  Dizziness and giddiness     Problem List Patient Active Problem List  Diagnosis Date Noted   Thyroid nodule 03/23/2021   Hiatal hernia 03/23/2021   GERD (gastroesophageal reflux disease) 03/23/2021   CVA (cerebral vascular accident) ([place]) 03/03/2021   Post-nasal drip 08/18/2020   Fatigue 04/26/2020   Epidermal inclusion cyst 04/03/2020   Generalized anxiety disorder 03/27/2020   Neck mass 01/19/2020   Epigastric pain 04/20/2019   Diabetes mellitus due to underlying condition with  hyperosmolarity without coma, with long-term current use of insulin ([place]) 04/19/2019   History of phimosis of penis 04/07/2018   Sciatica associated with disorder of lumbosacral spine 08/07/2016   Sleep apnea 02/01/2016   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   Healthcare maintenance 03/17/2015   Gout 08/08/2014   Hyperlipidemia 06/15/2014   Chronic HFrEF (heart failure with reduced ejection fraction) ([place]) 05/03/2014   Diabetes mellitus ([place]) 05/03/2014   Essential hypertension 05/03/2014   Morbid obesity ([place]) 10/10/2011    Jones Bales, PT, DPT 04/02/2021, 6:16 PM  [facility] [address] [place], [place], [address] Phone: [phone]   Fax:  [phone]  Name: GUSS FARRUGGIA MRN: [id number] Date of Birth: 1955/10/24

## 2021-04-02 NOTE — Patient Instructions (Signed)
Access Code: [email or web address] URL: [email or web address] Date: 04/02/2021 Prepared by: Baldomero Lamy  Exercises Standing with Head Rotation on Pillow - 1 x daily - 5 x weekly - 2 sets - 10 reps Standing with Head Nod on Pillow - 1 x daily - 5 x weekly - 2 sets - 10 reps Standing Near Stance in Corner with Eyes Closed - 1 x daily - 5 x weekly - 1 sets - 4 reps - 15 seconds hold

## 2021-04-16 ENCOUNTER — Other Ambulatory Visit (HOSPITAL_COMMUNITY): Payer: Self-pay

## 2021-04-17 ENCOUNTER — Ambulatory Visit: Payer: Medicare Other | Admitting: Physical Therapy

## 2021-04-17 ENCOUNTER — Other Ambulatory Visit: Payer: Self-pay

## 2021-04-17 ENCOUNTER — Encounter: Payer: Self-pay | Admitting: Physical Therapy

## 2021-04-17 ENCOUNTER — Other Ambulatory Visit (HOSPITAL_COMMUNITY): Payer: Self-pay

## 2021-04-17 DIAGNOSIS — R2689 Other abnormalities of gait and mobility: Secondary | ICD-10-CM

## 2021-04-17 DIAGNOSIS — R42 Dizziness and giddiness: Secondary | ICD-10-CM

## 2021-04-17 DIAGNOSIS — R2681 Unsteadiness on feet: Secondary | ICD-10-CM | POA: Diagnosis not present

## 2021-04-18 NOTE — Therapy (Signed)
[facility] [address] [place], [place], [address] Phone: [phone]   Fax:  [phone]  Physical Therapy Treatment  Patient Details  Name: Thomas Gardner MRN: [id number] Date of Birth: 1955-11-21 Referring Provider (PT): Jethro Bolus, PA-C   Encounter Date: 04/17/2021   PT End of Session - 04/18/21 1410     Visit Number 3    Number of Visits 17    Date for PT Re-Evaluation 05/23/21    Authorization Type UHC, $30 copay    PT Start Time 1532    PT Stop Time 1620    PT Time Calculation (min) 48 min    Activity Tolerance Patient tolerated treatment well    Behavior During Therapy [facility] for tasks assessed/performed             Past Medical History:  Diagnosis Date   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   Arthritis    Asthma    CHF (congestive heart failure) ([place])    Diabetes mellitus 2003   GERD (gastroesophageal reflux disease)    Hyperlipidemia    Hypertension    Morbid obesity ([place])    Sleep apnea     Past Surgical History:  Procedure Laterality Date   BREATH TEK H PYLORI  11/11/2011   Procedure: BREATH TEK H PYLORI;  Surgeon: Shann Medal, MD;  Location: [facility];  Service: General;  Laterality: N/A;   CARDIAC CATHETERIZATION     ESOPHAGOGASTRODUODENOSCOPY (EGD) WITH PROPOFOL N/A 05/04/2014   Procedure: ESOPHAGOGASTRODUODENOSCOPY (EGD) WITH PROPOFOL;  Surgeon: Cleotis Nipper, MD;  Location: [facility] ENDOSCOPY;  Service: Endoscopy;  Laterality: N/A;    There were no vitals filed for this visit.   Subjective Assessment - 04/17/21 1539     Subjective Pt states he has figured out that some of the dizziness or light-headedness is a side effect of one of the medications he is taking; pt also has noticed that lights at night bother him    Pertinent History bil knee pain (meniscus injuries but no surgeries); recent stroke    Limitations Walking;House hold activities    How long can  you sit comfortably? no issues    How long can you stand comfortably? no issues    Patient Stated Goals resolve dizziness    Currently in Pain? No/denies    Pain Onset 1 to 4 weeks ago                               [facility] Adult PT Treatment/Exercise - 04/18/21 0001       Ambulation/Gait   Ambulation/Gait Yes    Ambulation/Gait Assistance 5: Supervision    Ambulation/Gait Assistance Details amb. 40' x 2  in clinic and into hallway - performed horizontal head turns and then vertical head turns 40' x 1 rep each    Assistive device None    Gait Comments performed horizontal head turns 40' x 1 rep, then 40' x 1 rep with vertical head turns             Vestibular Treatment/Exercise - 04/18/21 0001       Vestibular Treatment/Exercise   Vestibular Treatment Provided Gaze    Gaze Exercises X1 Viewing Horizontal;X1 Viewing Vertical      X1 Viewing Horizontal   Foot Position bil. stance    Time --   30 secs x 2 reps   Reps 2    Comments  performed standing on floor on 1st rep, then standing on Airex 2nd rep   no increase in symptoms - no c/o dizziness     X1 Viewing Vertical   Foot Position bil stance    Time --   30 secs   Reps 2    Comments performed standing on floor 1st rep; then standing on Airex 2nd rep   No increase in symptoms - no c/o dizziness               Balance Exercises - 04/18/21 0001       Balance Exercises: Standing   Standing Eyes Opened Wide (BOA);Head turns;Foam/compliant surface;5 reps   horizontal and vertical head turns   Standing Eyes Closed Wide (BOA);Head turns;Foam/compliant surface;5 reps   horizontal and vertical head turns   Rockerboard Anterior/posterior;EO;EC;10 reps   10 reps with EO and then 10 reps with EC with UE support   Other Standing Exercises marching on pillows - EO 5 reps; then with EC 5 reps                PT Education - 04/18/21 1409     Education Details added exercises to HEP - standing on  pillow with EC with head turns and marching on pillow with EO- added to current Med[employer] HEP    Person(s) Educated Patient    Methods Handout;Explanation;Demonstration    Comprehension Verbalized understanding;Returned demonstration              PT Short Term Goals - 04/18/21 1415       PT SHORT TERM GOAL #1   Title Patient will be able to drive without significant issues of dizziness or motion sensitivity (all goals to be met by 04/25/21)    Baseline Has not driven since stroke (eval)    Time 4    Period Weeks    Status New    Target Date 04/25/21      PT SHORT TERM GOAL #2   Title Pt will be able to ambulate without st. cane in community to improve community ambulation    Baseline pt uses st. cane for community and no cane at home (eval)    Time 4    Period Weeks    Status New               PT Long Term Goals - 04/18/21 1415       PT LONG TERM GOAL #1   Title Pt will demo >20/30 on FGA to improve overall functional gait and balance and mobility (all LTG due by 05/23/21)    Baseline 13/30 (eval)    Time 8    Period Weeks    Status New      PT LONG TERM GOAL #2   Title Pt will be able to perform 5x sit to stand without HHA <12 seconds to improve functional strength with transfers    Baseline 17 sec (eval)    Time 8    Period Weeks    Status New      PT LONG TERM GOAL #3   Title Pt will demo at least 110/120 sec on Modified CTSIB to improve balance and proprioception with stading balance    Baseline 83/120 sec (eval)    Time 8    Period Weeks    Status New      PT LONG TERM GOAL #4   Title Pt will demo >1 m/s gait speed to improve community ambulation    Baseline  0.60ms without AD    Time 8    Period Weeks    Status New                   Plan - 04/18/21 1411     Clinical Impression Statement Pt reported no dizziness during today's session - only imbalance. Pt states he thinks that the dizziness/nausea has been side effects of medications  and reports it is almost fully resolved with medication changes.  Pt did very well maintaining balance on compliant surface in today's session - only had mild postural instability with performing x1 viewing exercise standing on Airex with no c/o dizziness.  Pt able to amb. with horizontal and vertical head turns with no major LOB.  Pt is progressing well.  Cont with POC.    PT Next Visit Plan Check HEP -Continue high level balance, dyanmic gait. eyes closed activities.             Patient will benefit from skilled therapeutic intervention in order to improve the following deficits and impairments:     Visit Diagnosis: Unsteadiness on feet  Other abnormalities of gait and mobility  Dizziness and giddiness     Problem List Patient Active Problem List   Diagnosis Date Noted   Thyroid nodule 03/23/2021   Hiatal hernia 03/23/2021   GERD (gastroesophageal reflux disease) 03/23/2021   CVA (cerebral vascular accident) ([place] 03/03/2021   Post-nasal drip 08/18/2020   Fatigue 04/26/2020   Epidermal inclusion cyst 04/03/2020   Generalized anxiety disorder 03/27/2020   Neck mass 01/19/2020   Epigastric pain 04/20/2019   Diabetes mellitus due to underlying condition with hyperosmolarity without coma, with long-term current use of insulin ([place] 04/19/2019   History of phimosis of penis 04/07/2018   Sciatica associated with disorder of lumbosacral spine 08/07/2016   Sleep apnea 02/01/2016   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   Healthcare maintenance 03/17/2015   Gout 08/08/2014   Hyperlipidemia 06/15/2014   Chronic HFrEF (heart failure with reduced ejection fraction) ([place] 05/03/2014   Diabetes mellitus ([place] 05/03/2014   Essential hypertension 05/03/2014   Morbid obesity ([place] 10/10/2011    Afreen Siebels, LJenness [place] PT 04/18/2021, 2:17 PM  [id number] [place] [address]Phone: [phone]   Fax:  [phone] Name: Thomas LANGHANSMRN: [id number] of Birth: 114-Jul-1957

## 2021-04-19 ENCOUNTER — Other Ambulatory Visit: Payer: Self-pay

## 2021-04-19 ENCOUNTER — Ambulatory Visit (INDEPENDENT_AMBULATORY_CARE_PROVIDER_SITE_OTHER): Payer: Medicare Other | Admitting: Internal Medicine

## 2021-04-19 ENCOUNTER — Other Ambulatory Visit (HOSPITAL_COMMUNITY): Payer: Self-pay

## 2021-04-19 ENCOUNTER — Ambulatory Visit: Payer: Medicare Other | Attending: Physician Assistant | Admitting: Physical Therapy

## 2021-04-19 ENCOUNTER — Encounter: Payer: Self-pay | Admitting: Physical Therapy

## 2021-04-19 ENCOUNTER — Encounter: Payer: Self-pay | Admitting: Internal Medicine

## 2021-04-19 VITALS — BP 120/65 | HR 80 | Temp 98.2°F | Ht 66.0 in | Wt 241.0 lb

## 2021-04-19 VITALS — BP 122/62 | HR 91

## 2021-04-19 DIAGNOSIS — E11628 Type 2 diabetes mellitus with other skin complications: Secondary | ICD-10-CM | POA: Diagnosis not present

## 2021-04-19 DIAGNOSIS — I63332 Cerebral infarction due to thrombosis of left posterior cerebral artery: Secondary | ICD-10-CM

## 2021-04-19 DIAGNOSIS — R42 Dizziness and giddiness: Secondary | ICD-10-CM | POA: Insufficient documentation

## 2021-04-19 DIAGNOSIS — Z794 Long term (current) use of insulin: Secondary | ICD-10-CM

## 2021-04-19 DIAGNOSIS — M6281 Muscle weakness (generalized): Secondary | ICD-10-CM | POA: Diagnosis present

## 2021-04-19 DIAGNOSIS — E782 Mixed hyperlipidemia: Secondary | ICD-10-CM | POA: Diagnosis not present

## 2021-04-19 DIAGNOSIS — R2689 Other abnormalities of gait and mobility: Secondary | ICD-10-CM | POA: Insufficient documentation

## 2021-04-19 DIAGNOSIS — I10 Essential (primary) hypertension: Secondary | ICD-10-CM | POA: Diagnosis not present

## 2021-04-19 DIAGNOSIS — R2681 Unsteadiness on feet: Secondary | ICD-10-CM | POA: Insufficient documentation

## 2021-04-19 MED ORDER — VITAMIN C 500 MG PO TABS
500.0000 mg | ORAL_TABLET | Freq: Every day | ORAL | 0 refills | Status: DC
  Filled 2021-04-19 – 2022-02-07 (×2): qty 30, 30d supply, fill #0

## 2021-04-19 MED ORDER — VITAMIN D3 25 MCG PO TABS
1000.0000 [IU] | ORAL_TABLET | Freq: Every day | ORAL | 0 refills | Status: DC
  Filled 2021-04-19 – 2022-02-07 (×2): qty 30, 30d supply, fill #0

## 2021-04-19 NOTE — Assessment & Plan Note (Signed)
Patient reports continuing to

## 2021-04-19 NOTE — Assessment & Plan Note (Signed)
Patient states that his blood glucose readings have been consistently in the mid-100's. He is eating a healthier diet and participating in intensive rehab from his stroke. He has not had any hypoglycemic episodes. - Continue current regimen of Lantus 16 units twice daily, Victoza 1.8 mg daily - Will check HbA1c at next visit

## 2021-04-19 NOTE — Patient Instructions (Addendum)
Thank you for visiting the Internal Medicine Clinic today. It was a pleasure to meet you! Today we discussed managing your medical conditions that can increase stroke risk when they are not well-controlled including high cholesterol, diabetes, and hypertension.   High blood pressure: Please keep taking carvedilol 25 mg twice daily, chlorthalidone 25 mg daily, and losartan 25 mg daily. If you feel dizzy or unsteady on your feet, sit down and rise to standing once your symptoms resolve slowly. Remember to stay hydrated! If these symptoms persist, please let us know at your next visit so that we can assess whether or not we need to adjust your medications.  Diabetes: Please keep taking Lantus 16 units twice daily and Victoza 1.8 mg daily, as well as eating a healthy diet and staying as active as your body allows. We will check your HbA1c at your next visit.  High cholesterol: Please keep taking Crestor 40 mg daily. We will check a lipid panel at your next visit.   Follow-up: In 6-8 weeks for check-up as well as cholesterol and diabetes labs.  Remember: If you have any questions or concerns, please call our clinic at [phone] between 9am-5pm and after hours call [phone] and ask for the internal medicine resident on call. If you feel you are having a medical emergency please call 911.  Farrel Gordon, DO

## 2021-04-19 NOTE — Assessment & Plan Note (Signed)
Patient endorses adherhance to intensive rehab and that he has noticed improvement in his strength. He has a follow-up with neurology on 04/26/2021. We will continue to manage comorbidities.

## 2021-04-19 NOTE — Assessment & Plan Note (Signed)
Patient was started on crestor 40 mg during admission for CVA. He reports that he is tolerating it well at this time. He is currently scheduled with cardiology on 05/07/2021. - Continue Crestor 40 mg daily - Lipid panel at next visit, LDL goal<70

## 2021-04-19 NOTE — Progress Notes (Signed)
   CC: stroke follow-up  HPI:  [name] is a 65 y.o. M with PMH as listed below who presents for follow-up after recent CVA. Please see problem-based charting for detailed assessment and plan.  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   Cardiovascular:  Negative for chest pain.  Musculoskeletal:  Positive for joint pain (wrist, has history of carpal tunnel). Negative for falls.  Neurological:  Positive for dizziness (after taking medications some mornings), tremors (in legs when standing for long period of time), weakness (L>R residual from CVA but improving) and headaches (occasional). Negative for loss of consciousness.    Physical Exam:  Vitals:   04/19/21 1401  BP: (!) 143/76  Pulse: 80  Temp: 98.2 F (36.8 C)  TempSrc: Oral  SpO2: 99%  Weight: 241 lb (109.3 kg)  Height: 5\' 6"  (1.676 m)   Constitutional: Pleasant gentleman, no acute distress. Cardio: Regular rate and rhythm. No murmurs, rubs, gallops. Pulm: Clear to auscultation bilaterally. Normal work of breathing on room air. Abdomen: Soft, non-tender, non-distended. MSK: No extremity edema noted. Skin:Warm and dry. Neuro: Mental Status: Patient is awake, alert, oriented x3 No signs of aphasia or neglect Cranial Nerves: II: Pupils equal, round, and reactive to light.   III,IV, VI: EOMI without ptosis or diploplia.  V: Facial sensation is symmetric to light touch and  Temperature. VII: Facial movement is symmetric.  VIII: hearing is intact to voice X: Uvula elevates symmetrically XI: Shoulder shrug is symmetric. XII: tongue is midline without atrophy or  fasciculations.  Motor: Normal effort thorughout, at Least 5/5 RUE and 4+/5 LUE, 5/5 RLE and 4/5 LLE Sensory: Sensation is grossly intact and equal bilateral UEs & LEs Psych: Normal mood and affect  Assessment & Plan:   See Encounters Tab for problem based charting.  Patient seen with Dr.  Saverio Danker

## 2021-04-19 NOTE — Assessment & Plan Note (Signed)
BP is under good control at this time on current regimen. He does complain of occasional dizziness and instability after taking his medications in the morning, particularly if he engages in activity soon after taking them. I have advised him to continue his current regimen of carvedilol 25 mg daily, chlorthalidone 25 mg daily, and losartan 25 mg daily, and keep a log of when and how frequently he experiences these symptoms so that we can adjust his regimen if needed and his next visit.

## 2021-04-20 ENCOUNTER — Ambulatory Visit: Payer: Medicare Other

## 2021-04-20 NOTE — Progress Notes (Signed)
Internal Medicine Clinic Attending  I saw and evaluated the patient.  I personally confirmed the key portions of the history and exam documented by Dr.  Dean  and I reviewed pertinent patient test results.  The assessment, diagnosis, and plan were formulated together and I agree with the documentation in the resident's note.  

## 2021-04-20 NOTE — Therapy (Signed)
[facility] [address] [place], [place], [address] Phone: [phone]   Fax:  [phone]  Physical Therapy Treatment  Patient Details  Name: Thomas Gardner MRN: [id number] Date of Birth: 1956/03/15 Referring Provider (PT): Jethro Bolus, PA-C   Encounter Date: 04/19/2021   PT End of Session - 04/20/21 1513     Visit Number 4    Number of Visits 17    Date for PT Re-Evaluation 05/23/21    Authorization Type UHC, $30 copay    PT Start Time 1102    PT Stop Time 6606    PT Time Calculation (min) 43 min    Equipment Utilized During Treatment Gait belt    Activity Tolerance Patient tolerated treatment well    Behavior During Therapy [facility] for tasks assessed/performed             Past Medical History:  Diagnosis Date   Acute stroke of medulla oblongata ([place]) 03/07/2021   Arthritis    Asthma    CHF (congestive heart failure) ([place])    Diabetes mellitus 2003   GERD (gastroesophageal reflux disease)    Hyperlipidemia    Hypertension    Morbid obesity ([place])    Sleep apnea     Past Surgical History:  Procedure Laterality Date   BREATH TEK H PYLORI  11/11/2011   Procedure: BREATH TEK H PYLORI;  Surgeon: Shann Medal, MD;  Location: [facility] ENDOSCOPY;  Service: General;  Laterality: N/A;   CARDIAC CATHETERIZATION     ESOPHAGOGASTRODUODENOSCOPY (EGD) WITH PROPOFOL N/A 05/04/2014   Procedure: ESOPHAGOGASTRODUODENOSCOPY (EGD) WITH PROPOFOL;  Surgeon: Cleotis Nipper, MD;  Location: [facility] ENDOSCOPY;  Service: Endoscopy;  Laterality: N/A;    Vitals:   04/19/21 1105 04/19/21 1145  BP: (!) 98/56 122/62  Pulse: 86 91     Subjective Assessment - 04/19/21 1658     Subjective Pt reports he is feeling a little dizzy today and has a headache - states he just took BP medication about an hour ago and thinks some of what he is feeling is a side effect of this medication    Pertinent History bil knee pain (meniscus injuries  but no surgeries); recent stroke    Limitations Walking;House hold activities    How long can you sit comfortably? no issues    How long can you stand comfortably? no issues    Patient Stated Goals resolve dizziness    Currently in Pain? Yes    Pain Score 4     Pain Location Head    Pain Orientation Right;Anterior    Pain Descriptors / Indicators Headache    Pain Onset In the past 7 days    Pain Frequency Occasional                                    Balance Exercises - 04/20/21 0001       Balance Exercises: Standing   Standing Eyes Opened Narrow base of support (BOS);Head turns;Foam/compliant surface;5 reps   horizontal and vertical head turns   Standing Eyes Closed Wide (BOA);Head turns;Foam/compliant surface;5 reps   horizontal and vertical head turns 5 reps each   Standing Eyes Closed Limitations RLE noted to tremor with prolonged standing on pillows    Rockerboard Anterior/posterior;EO;EC;Other reps (comment);Intermittent UE support   10 reps EO, then 10 reps with EC   Marching Foam/compliant surface;Static   10 reps each  LE with UE support on chair in front prn   Other Standing Exercises Pt performed marching on blue mat on floor 10 reps each leg in place; marching forward/backward on blue mat 2 reps - no head turns with CGA for safety    Other Standing Exercises Comments Pt performed trunk rotations Lt to Rt x 5 reps EO; then 5 reps EC; diagonals "X" 5 reps with EO each diagonal and then 5 reps with EC with close CGA; pt had one LOB anteriorly with reaching low but was able to recover with only CGA            Pt performed standing static on rockerboard - EO - trying to hold board steady - horizontal head turns 5 reps; vertical Head turns 5 reps with UE support prn on // bars  Pt performed marching on 2 pillows in corner - performed alternate step down to floor with head turn to one side, then stepping Back up onto pillow with head turned back to  neutral, focusing straight ahead - 5 reps each leg with CGA      PT Short Term Goals - 04/20/21 1513       PT SHORT TERM GOAL #1   Title Patient will be able to drive without significant issues of dizziness or motion sensitivity (all goals to be met by 04/25/21)    Baseline Has not driven since stroke (eval)    Time 4    Period Weeks    Status New    Target Date 04/25/21      PT SHORT TERM GOAL #2   Title Pt will be able to ambulate without st. cane in community to improve community ambulation    Baseline pt uses st. cane for community and no cane at home (eval)    Time 4    Period Weeks    Status New               PT Long Term Goals - 04/20/21 1514       PT LONG TERM GOAL #1   Title Pt will demo >20/30 on FGA to improve overall functional gait and balance and mobility (all LTG due by 05/23/21)    Baseline 13/30 (eval)    Time 8    Period Weeks    Status New      PT LONG TERM GOAL #2   Title Pt will be able to perform 5x sit to stand without HHA <12 seconds to improve functional strength with transfers    Baseline 17 sec (eval)    Time 8    Period Weeks    Status New      PT LONG TERM GOAL #3   Title Pt will demo at least 110/120 sec on Modified CTSIB to improve balance and proprioception with stading balance    Baseline 83/120 sec (eval)    Time 8    Period Weeks    Status New      PT LONG TERM GOAL #4   Title Pt will demo >1 m/s gait speed to improve community ambulation    Baseline 0.71ms without AD    Time 8    Period Weeks    Status New                   Plan - 04/19/21 1106     Clinical Impression Statement Skilled PT session focused on balance activities with increased vestibular input needed to maintain balance on compliant surfaces  and with EC.  Pt's standing tolerance is limited by Rt knee pain/discomfort with prolonged standing and also due to tremors occurring in RLE with prolonged standing on compliant surface.  Pt carries cane to  session but does not use it during treatment session.  Pt reported having a headache during today's session - pt attributed this and also feeling slightly dizzy to having taken BP medication approx. 1 hour prior to appt.  Cont with POC.    PT Next Visit Plan Check STG's next session -Continue high level balance, dyanmic gait. eyes closed activities.    Consulted and Agree with Plan of Care Patient             Patient will benefit from skilled therapeutic intervention in order to improve the following deficits and impairments:     Visit Diagnosis: Unsteadiness on feet  Other abnormalities of gait and mobility     Problem List Patient Active Problem List   Diagnosis Date Noted   Cerebrovascular accident (CVA) due to thrombosis of left posterior cerebral artery ([place]) 04/19/2021   Thyroid nodule 03/23/2021   Hiatal hernia 03/23/2021   CVA (cerebral vascular accident) ([place]) 03/03/2021   Post-nasal drip 08/18/2020   Fatigue 04/26/2020   Epidermal inclusion cyst 04/03/2020   Generalized anxiety disorder 03/27/2020   Neck mass 01/19/2020   Epigastric pain 04/20/2019   Diabetes mellitus due to underlying condition with hyperosmolarity without coma, with long-term current use of insulin ([place]) 04/19/2019   History of phimosis of penis 04/07/2018   Sciatica associated with disorder of lumbosacral spine 08/07/2016   Sleep apnea 02/01/2016   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   Healthcare maintenance 03/17/2015   Gout 08/08/2014   Hyperlipidemia 06/15/2014   Chronic HFrEF (heart failure with reduced ejection fraction) ([place]) 05/03/2014   Diabetes mellitus ([place]) 05/03/2014   Essential hypertension 05/03/2014   Morbid obesity ([place]) 10/10/2011    Lavel Rieman, [place], PT 04/20/2021, 3:21 PM  [facility] [place] [address] [place], [place], [address] Phone: [phone]   Fax:  [phone]  Name: Thomas Gardner MRN:  [id number] Date of Birth: [birth year]

## 2021-04-23 ENCOUNTER — Ambulatory Visit: Payer: Medicare Other

## 2021-04-24 ENCOUNTER — Other Ambulatory Visit (HOSPITAL_COMMUNITY)
Admission: RE | Admit: 2021-04-24 | Discharge: 2021-04-24 | Disposition: A | Discharge status: Home / Self Care | Payer: Medicare Other | Source: Ambulatory Visit | Attending: Physician Assistant | Admitting: Physician Assistant

## 2021-04-24 ENCOUNTER — Ambulatory Visit
Admission: RE | Admit: 2021-04-24 | Discharge: 2021-04-24 | Disposition: A | Discharge status: Home / Self Care | Payer: Medicare Other | Source: Ambulatory Visit | Attending: Student in an Organized Health Care Education/Training Program | Admitting: Student in an Organized Health Care Education/Training Program

## 2021-04-24 DIAGNOSIS — E041 Nontoxic single thyroid nodule: Secondary | ICD-10-CM

## 2021-04-24 DIAGNOSIS — D440 Neoplasm of uncertain behavior of thyroid gland: Secondary | ICD-10-CM | POA: Diagnosis not present

## 2021-04-25 LAB — CYTOLOGY - NON PAP

## 2021-04-26 ENCOUNTER — Inpatient Hospital Stay: Payer: Medicare Other | Admitting: Adult Health

## 2021-04-30 ENCOUNTER — Other Ambulatory Visit: Payer: Self-pay

## 2021-04-30 ENCOUNTER — Ambulatory Visit: Payer: Medicare Other

## 2021-04-30 VITALS — BP 122/64

## 2021-04-30 DIAGNOSIS — R42 Dizziness and giddiness: Secondary | ICD-10-CM

## 2021-04-30 DIAGNOSIS — R2689 Other abnormalities of gait and mobility: Secondary | ICD-10-CM

## 2021-04-30 DIAGNOSIS — R2681 Unsteadiness on feet: Secondary | ICD-10-CM | POA: Diagnosis not present

## 2021-04-30 NOTE — Therapy (Signed)
[facility] [address] [place], [place], [address] Phone: [phone]   Fax:  [phone]  Physical Therapy Treatment  Patient Details  Name: Thomas Gardner MRN: [id number] Date of Birth: 05/05/1956 Referring Provider (PT): Jethro Bolus, PA-C   Encounter Date: 04/30/2021   PT End of Session - 04/30/21 1459     Visit Number 5    Number of Visits 17    Date for PT Re-Evaluation 05/23/21    Authorization Type UHC, $30 copay    PT Start Time 1449    PT Stop Time 1530    PT Time Calculation (min) 41 min    Equipment Utilized During Treatment Gait belt    Activity Tolerance Patient tolerated treatment well    Behavior During Therapy [facility] for tasks assessed/performed             Past Medical History:  Diagnosis Date   Acute stroke of medulla oblongata ([place]) 03/07/2021   Arthritis    Asthma    CHF (congestive heart failure) ([place])    Diabetes mellitus 2003   GERD (gastroesophageal reflux disease)    Hyperlipidemia    Hypertension    Morbid obesity ([place])    Sleep apnea     Past Surgical History:  Procedure Laterality Date   BREATH TEK H PYLORI  11/11/2011   Procedure: BREATH TEK H PYLORI;  Surgeon: Shann Medal, MD;  Location: [facility];  Service: General;  Laterality: N/A;   CARDIAC CATHETERIZATION     ESOPHAGOGASTRODUODENOSCOPY (EGD) WITH PROPOFOL N/A 05/04/2014   Procedure: ESOPHAGOGASTRODUODENOSCOPY (EGD) WITH PROPOFOL;  Surgeon: Cleotis Nipper, MD;  Location: [facility] ENDOSCOPY;  Service: Endoscopy;  Laterality: N/A;    Vitals:   04/30/21 1450  BP: 122/64     Subjective Assessment - 04/30/21 1450     Subjective Pt reports that when he takes his medications in the morning he feels a little "loopy". Pt states he believes it is the medications because when he first wakes up that he feels "fresh with movement" but that this feeling occurs after his medications. Pt states he is splitting up his  meds to avoid this feeling. Pt reports he is reducing his cane use.    Pertinent History bil knee pain (meniscus injuries but no surgeries); recent stroke    Limitations Walking;House hold activities    How long can you sit comfortably? no issues    How long can you stand comfortably? no issues    Patient Stated Goals resolve dizziness    Currently in Pain? Yes    Pain Score 5     Pain Location Knee    Pain Orientation Right;Left    Pain Descriptors / Indicators Aching    Pain Type Chronic pain    Pain Onset More than a month ago                               [facility] Adult PT Treatment/Exercise - 04/30/21 1528       Ambulation/Gait   Ambulation/Gait Yes    Ambulation/Gait Assistance 5: Supervision    Ambulation Distance (Feet) 230 Feet    Assistive device None    Gait Comments Pt performed 2 laps around the track w/ PT cueing pt to go slow, fast, normal, stop, and turn. Pt had increased difficulty w/ RLE buckling w/ walking slow and w/ turns. PT provided min guard. PT performed 2x5f of walking  with head turns both vertical/horizontal. Pt demonstrated slight loss of balances w/ both directions but reported no dizziness. Pt demonstrated increased gait deviation w/ vertical head turns. PT provided min gurad.      Neuro Re-ed    Neuro Re-ed Details  Pt performed 3 laps of reciprocal stepping over low hurdles on blue mat. Pt demonstrated increased difficutly w/ stepping w/ LLE and supporting himself on RLE. Pt required intermittent UE support at counter for balance. PT provided min guard and verbal cues for techique. Pt performed 2 laps of 4 cone taps w/ marching on blue mat at counter. Pt demonstrated increased difficutly w/ tapping w/ LLE. PT provided min guard and verbal cues for slow controlled movements.      Exercises   Exercises Other Exercises    Other Exercises  Pt performed 3x10 of step ups at stairs going up w/ RLE and down w/ LLE. Pt started off w/ UE support  but was able to diminish UE to no assist on last sets. PT provided min guard and verbal cues for technique.                       PT Short Term Goals - 04/30/21 1459       PT SHORT TERM GOAL #1   Title Patient will be able to drive without significant issues of dizziness or motion sensitivity (all goals to be met by 04/25/21)    Baseline Has not driven since stroke (eval) 12/12- pt reports being able to drive w/ a slight head tilt but still gets woozy w/ sitting straight up or at night w/ headlights    Time 4    Period Weeks    Status Partially Met    Target Date 04/25/21      PT SHORT TERM GOAL #2   Title Pt will be able to ambulate without st. cane in community to improve community ambulation    Baseline pt uses st. cane for community and no cane at home (eval) 12/12- pt reports his cane use has diminished now to about 50% of the time especially w/ unlevel surface walking    Time 4    Period Weeks    Status Partially Met               PT Long Term Goals - 04/20/21 1514       PT LONG TERM GOAL #1   Title Pt will demo >20/30 on FGA to improve overall functional gait and balance and mobility (all LTG due by 05/23/21)    Baseline 13/30 (eval)    Time 8    Period Weeks    Status New      PT LONG TERM GOAL #2   Title Pt will be able to perform 5x sit to stand without HHA <12 seconds to improve functional strength with transfers    Baseline 17 sec (eval)    Time 8    Period Weeks    Status New      PT LONG TERM GOAL #3   Title Pt will demo at least 110/120 sec on Modified CTSIB to improve balance and proprioception with stading balance    Baseline 83/120 sec (eval)    Time 8    Period Weeks    Status New      PT LONG TERM GOAL #4   Title Pt will demo >1 m/s gait speed to improve community ambulation    Baseline 0.67ms without AD  Time 8    Period Weeks    Status New                   Plan - 04/30/21 1544     Clinical Impression  Statement Today's PT session focused on checking pt's STG and continued dynamic balance activities. Pt was able to partially meet both STG but still requires use of cane over uneven surfaces and still gets slight dizziness while driving espeically at night and w/ sitting upright in the car. Pt still continues to show deficits in gait w/ speed changes and head turns and while ambulating over complaint/uneven surfaces. PT will continue to progress pt as tolerated w/ POC towards pt achievment of LTG.    PT Next Visit Plan Continue high level balance, dyanmic gait. eyes closed activities.    Consulted and Agree with Plan of Care Patient             Patient will benefit from skilled therapeutic intervention in order to improve the following deficits and impairments:     Visit Diagnosis: Unsteadiness on feet  Dizziness and giddiness  Other abnormalities of gait and mobility     Problem List Patient Active Problem List   Diagnosis Date Noted   Cerebrovascular accident (CVA) due to thrombosis of left posterior cerebral artery ([place]) 04/19/2021   Thyroid nodule 03/23/2021   Hiatal hernia 03/23/2021   CVA (cerebral vascular accident) ([place]) 03/03/2021   Post-nasal drip 08/18/2020   Fatigue 04/26/2020   Epidermal inclusion cyst 04/03/2020   Generalized anxiety disorder 03/27/2020   Neck mass 01/19/2020   Epigastric pain 04/20/2019   Diabetes mellitus due to underlying condition with hyperosmolarity without coma, with long-term current use of insulin ([place]) 04/19/2019   History of phimosis of penis 04/07/2018   Sciatica associated with disorder of lumbosacral spine 08/07/2016   Sleep apnea 02/01/2016   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   Healthcare maintenance 03/17/2015   Gout 08/08/2014   Hyperlipidemia 06/15/2014   Chronic HFrEF (heart failure with reduced ejection fraction) ([place]) 05/03/2014   Diabetes mellitus ([place]) 05/03/2014   Essential hypertension 05/03/2014   Morbid  obesity ([place]) 10/10/2011    Lottie Mussel, Student-PT 04/30/2021, 3:48 PM  [facility] [address] [place], [place], [address] Phone: [phone]   Fax:  [phone]  Name: Thomas Gardner MRN: [id number] Date of Birth: 1955/09/30

## 2021-05-03 ENCOUNTER — Ambulatory Visit: Payer: Medicare Other | Admitting: Physical Therapy

## 2021-05-03 ENCOUNTER — Other Ambulatory Visit: Payer: Self-pay

## 2021-05-03 VITALS — BP 134/75 | HR 82

## 2021-05-03 DIAGNOSIS — R42 Dizziness and giddiness: Secondary | ICD-10-CM

## 2021-05-03 DIAGNOSIS — R2689 Other abnormalities of gait and mobility: Secondary | ICD-10-CM

## 2021-05-03 DIAGNOSIS — R2681 Unsteadiness on feet: Secondary | ICD-10-CM

## 2021-05-04 NOTE — Therapy (Signed)
[facility] [address] [place], [place], [address] Phone: [phone]   Fax:  [phone]  Physical Therapy Treatment  Patient Details  Name: Thomas Gardner MRN: [id number] Date of Birth: 01-15-1956 Referring Provider (PT): Jethro Bolus, PA-C   Encounter Date: 05/03/2021   PT End of Session - 05/04/21 1152     Visit Number 6    Number of Visits 17    Date for PT Re-Evaluation 05/23/21    Authorization Type UHC, $30 copay    PT Start Time 1316    PT Stop Time 1400    PT Time Calculation (min) 44 min    Equipment Utilized During Treatment Gait belt    Activity Tolerance Patient tolerated treatment well    Behavior During Therapy [facility] for tasks assessed/performed             Past Medical History:  Diagnosis Date   Acute stroke of medulla oblongata ([place]) 03/07/2021   Arthritis    Asthma    CHF (congestive heart failure) ([place])    Diabetes mellitus 2003   GERD (gastroesophageal reflux disease)    Hyperlipidemia    Hypertension    Morbid obesity ([place])    Sleep apnea     Past Surgical History:  Procedure Laterality Date   BREATH TEK H PYLORI  11/11/2011   Procedure: BREATH TEK H PYLORI;  Surgeon: Shann Medal, MD;  Location: [facility];  Service: General;  Laterality: N/A;   CARDIAC CATHETERIZATION     ESOPHAGOGASTRODUODENOSCOPY (EGD) WITH PROPOFOL N/A 05/04/2014   Procedure: ESOPHAGOGASTRODUODENOSCOPY (EGD) WITH PROPOFOL;  Surgeon: Cleotis Nipper, MD;  Location: [facility] ENDOSCOPY;  Service: Endoscopy;  Laterality: N/A;    Vitals:   05/03/21 1324  BP: 134/75  Pulse: 82     Subjective Assessment - 05/03/21 1326     Subjective Pt states his BP has been fluctuating - had some nausea this morning and yesterday am that lasted about 5" - states he drank water and it came back up; has been working on walking on grass at home    Pertinent History bil knee pain (meniscus injuries but no surgeries);  recent stroke    Limitations Walking;House hold activities    How long can you sit comfortably? no issues    How long can you stand comfortably? no issues    Patient Stated Goals resolve dizziness    Currently in Pain? Yes    Pain Score 5     Pain Location Knee    Pain Orientation Right;Left    Pain Descriptors / Indicators Aching    Pain Type Chronic pain    Pain Onset More than a month ago    Pain Frequency Constant                               [facility] Adult PT Treatment/Exercise - 05/04/21 0001       High Level Balance   High Level Balance Activities Backward walking;Head turns;Sudden stops                   Balance Exercises: Standing   Standing Eyes Opened Narrow base of support (BOS);Head turns;Foam/compliant surface;5 reps   horizontal and vertical head turns   Standing Eyes Closed Wide (BOA);Head turns;Foam/compliant surface;5 reps   horizontal and vertical head turns 5 reps each   Standing Eyes Closed Limitations RLE noted to tremor with prolonged standing on  pillows    Rockerboard Anterior/posterior;EO;EC;Other reps (comment);Intermittent UE support   10 reps EO, then 10 reps with EC   Marching Foam/compliant surface;Static   10 reps each LE with UE support on chair in front prn   Other Standing Exercises Pt performed marching on blue mat on floor 10 reps each leg in place; marching forward/backward on blue mat 2 reps - no head turns with CGA for safety            Pt performed standing on blue mat - forward, back and side kicks 10 reps each leg alternating with CGA   Standing on inverted Bosu - weight shifts anterior/posterior 10 reps and laterally 10 reps       PT Short Term Goals - 05/04/21 1156       PT SHORT TERM GOAL #1   Title Patient will be able to drive without significant issues of dizziness or motion sensitivity (all goals to be met by 04/25/21)    Baseline Has not driven since stroke (eval) 12/12- pt reports being able  to drive w/ a slight head tilt but still gets woozy w/ sitting straight up or at night w/ headlights    Time 4    Period Weeks    Status Partially Met    Target Date 04/25/21      PT SHORT TERM GOAL #2   Title Pt will be able to ambulate without st. cane in community to improve community ambulation    Baseline pt uses st. cane for community and no cane at home (eval) 12/12- pt reports his cane use has diminished now to about 50% of the time especially w/ unlevel surface walking    Time 4    Period Weeks    Status Partially Met               PT Long Term Goals - 05/04/21 1156       PT LONG TERM GOAL #1   Title Pt will demo >20/30 on FGA to improve overall functional gait and balance and mobility (all LTG due by 05/23/21)    Baseline 13/30 (eval)    Time 8    Period Weeks    Status New      PT LONG TERM GOAL #2   Title Pt will be able to perform 5x sit to stand without HHA <12 seconds to improve functional strength with transfers    Baseline 17 sec (eval)    Time 8    Period Weeks    Status New      PT LONG TERM GOAL #3   Title Pt will demo at least 110/120 sec on Modified CTSIB to improve balance and proprioception with stading balance    Baseline 83/120 sec (eval)    Time 8    Period Weeks    Status New      PT LONG TERM GOAL #4   Title Pt will demo >1 m/s gait speed to improve community ambulation    Baseline 0.46ms without AD    Time 8    Period Weeks    Status New                   Plan - 05/04/21 1153     Clinical Impression Statement PT session focused on balance activities with increased vestibular input required and also on balance training for improved SLS on each leg.  Pt amb. into clinic without use of SPC today.  Pt continues to  have tremors in bil LE's with prolonged standing on compliant surfaces.  Pt is progressing towards goals with improved balance noted.  Cont with POC.    PT Next Visit Plan Continue high level balance, dyanmic gait.  eyes closed activities.    Consulted and Agree with Plan of Care Patient             Patient will benefit from skilled therapeutic intervention in order to improve the following deficits and impairments:     Visit Diagnosis: Unsteadiness on feet  Other abnormalities of gait and mobility  Dizziness and giddiness     Problem List Patient Active Problem List   Diagnosis Date Noted   Cerebrovascular accident (CVA) due to thrombosis of left posterior cerebral artery ([place]) 04/19/2021   Thyroid nodule 03/23/2021   Hiatal hernia 03/23/2021   CVA (cerebral vascular accident) ([place]) 03/03/2021   Post-nasal drip 08/18/2020   Fatigue 04/26/2020   Epidermal inclusion cyst 04/03/2020   Generalized anxiety disorder 03/27/2020   Neck mass 01/19/2020   Epigastric pain 04/20/2019   Diabetes mellitus due to underlying condition with hyperosmolarity without coma, with long-term current use of insulin ([place]) 04/19/2019   History of phimosis of penis 04/07/2018   Sciatica associated with disorder of lumbosacral spine 08/07/2016   Sleep apnea 02/01/2016   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   Healthcare maintenance 03/17/2015   Gout 08/08/2014   Hyperlipidemia 06/15/2014   Chronic HFrEF (heart failure with reduced ejection fraction) ([place]) 05/03/2014   Diabetes mellitus ([place]) 05/03/2014   Essential hypertension 05/03/2014   Morbid obesity ([place]) 10/10/2011    Teren Franckowiak, [place], PT 05/04/2021, 12:02 PM  [facility] [address] [place], [place], [address] Phone: [phone]   Fax:  [phone]  Name: BERND CROM MRN: [id number] Date of Birth: 1956/03/17

## 2021-05-07 ENCOUNTER — Ambulatory Visit (HOSPITAL_BASED_OUTPATIENT_CLINIC_OR_DEPARTMENT_OTHER): Payer: Medicare Other | Admitting: Cardiovascular Disease

## 2021-05-07 ENCOUNTER — Encounter (HOSPITAL_BASED_OUTPATIENT_CLINIC_OR_DEPARTMENT_OTHER): Payer: Self-pay | Admitting: Cardiovascular Disease

## 2021-05-07 ENCOUNTER — Encounter (HOSPITAL_COMMUNITY): Payer: Self-pay

## 2021-05-07 ENCOUNTER — Other Ambulatory Visit: Payer: Self-pay

## 2021-05-07 VITALS — BP 134/72 | HR 87 | Ht 66.0 in | Wt 235.3 lb

## 2021-05-07 DIAGNOSIS — I5022 Chronic systolic (congestive) heart failure: Secondary | ICD-10-CM | POA: Diagnosis not present

## 2021-05-07 DIAGNOSIS — I10 Essential (primary) hypertension: Secondary | ICD-10-CM | POA: Diagnosis not present

## 2021-05-07 DIAGNOSIS — E7849 Other hyperlipidemia: Secondary | ICD-10-CM | POA: Diagnosis not present

## 2021-05-07 DIAGNOSIS — R0609 Other forms of dyspnea: Secondary | ICD-10-CM

## 2021-05-07 DIAGNOSIS — I63332 Cerebral infarction due to thrombosis of left posterior cerebral artery: Secondary | ICD-10-CM

## 2021-05-07 HISTORY — DX: Other hyperlipidemia: E78.49

## 2021-05-07 NOTE — Patient Instructions (Signed)
Medication Instructions:  TAKE- Crestor at night  *If you need a refill on your cardiac medications before your next appointment, please call your pharmacy*   Lab Work: None Ordered   Testing/Procedures: None Ordered   Follow-Up: At [facility], you and your health needs are our priority.  As part of our continuing mission to provide you with exceptional heart care, we have created designated Provider Care Teams.  These Care Teams include your primary Cardiologist (physician) and Advanced Practice Providers (APPs -  Physician Assistants and Nurse Practitioners) who all work together to provide you with the care you need, when you need it.  We recommend signing up for the patient portal called "MyChart".  Sign up information is provided on this After Visit Summary.  MyChart is used to connect with patients for Virtual Visits (Telemedicine).  Patients are able to view lab/test results, encounter notes, upcoming appointments, etc.  Non-urgent messages can be sent to your provider as well.   To learn more about what you can do with MyChart, go to [email or web address].    Your next appointment:   4 month(s)  The format for your next appointment:   In Person  Provider:   Skeet Latch, MD    Other Instructions 1 Month with PharmD  Check blood pressure twice a day and bring reading at next office visit.

## 2021-05-07 NOTE — Assessment & Plan Note (Signed)
BP reasonably well-controlled here but has been labile at home.  He will track his BP at home and bring to follow up.  If his BP will allow we will switch losartan to Entresto.  Continue carvedilol, chlorthalidone, and start taking meds after eating to increase tolerability.

## 2021-05-07 NOTE — Assessment & Plan Note (Addendum)
LVEF was 38% and has improved to 50%.  He is euvolemic.  There was a concern of ischemic cardiomyopathy but he reportedly had normal coronaries on cath.  He has no ischemic symptoms.  Continue carvedilol, Jardiance, and losartan.  Plan to switch to [facility] if BP tolerates.

## 2021-05-07 NOTE — Assessment & Plan Note (Signed)
Will plan for PREP referral once balance improves and he finishes PT.

## 2021-05-07 NOTE — Progress Notes (Signed)
Cardiology Office Note:    Date:  05/07/2021   ID:  Thomas Gardner, DOB Jul 17, 1955, MRN [id number]  PCP:  Scarlett Presto, MD   [facility] Providers Cardiologist:  None     Referring MD: Scarlett Presto, MD   No chief complaint on file.   History of Present Illness:    Thomas Gardner is a 65 y.o. male with a hx of hypertension, hyperlipidemia, chronic diastolic heart failure, diabetes, morbid obesity, asthma, GERD, arthritis, and sleep apnea,  here for the evaluation of hyperlipidemia and chronic heart failure with reduced ejection fraction. He was admitted 02/2021 with an acute ischemic stroke of the left thalamic pyramid. He had severe stenosis of the proximal left external carotid artery. He also had multifocal moderate to severe stenoses of the right posterior cerebral artery. He was treated with aspirin and plavix for 3 months, and then aspirin alone. Echo on admission revealed LVEF 50% and indeterminate diastolic dysfunction. His hospitalization was also complicated by acute on chronic diastolic heart failure requiring diuresis. Outpatient follow up with cardiology was recommended. He was noted to have a thyroid nodule, and outpatient FNA was recommended. He previously had an Echo 11/2020 with LVEF 35-40% and grade 1 diastolic dysfunction. Nuclear and stress tests in 2013 revealed LVEF 37% and no ischemia.  Today, he states he is doing pretty good considering his stroke. He continues to have imbalance issues, and is scheduled to stay in physical therapy until the end of January. He also has some bilateral LE pain. However, his main concern is his antihypertensives. After taking his morning medications, he has been feeling nauseated and emetic. Lately he only brings up liquids as he has taken his medication on an empty stomach. After emesis, he feels lightheaded but not dizzy. His vision is blurry for a few seconds as well. He is unsure what is causing his lightheadedness. He is still  taking Crestor, and has recently started [place]. He began breaking his tablets in half. Today he has taken 1 tablet of carvedilol, but he has not taken the second half of his other medications. At home his blood pressure has been running high. In the morning his BP is usually high such as 179, but this morning it was 142/84 on recheck after calming himself. A few weeks ago he felt winded while walking into the building for physical therapy. It was noted that his blood pressure was low. Some days he feels abdominal tenderness or constipation. This seems to be random and he wonders if this is a side effect of his medication. In his diet he has been trying to eat more salads and fruits. He denies any palpitations, or chest pain. No headaches, syncope, orthopnea, PND, or lower extremity edema.   Past Medical History:  Diagnosis Date   Acute stroke of medulla oblongata ([place]) 03/07/2021   Arthritis    Asthma    CHF (congestive heart failure) ([place])    Diabetes mellitus 2003   Familial hyperlipidemia 05/07/2021   GERD (gastroesophageal reflux disease)    Hyperlipidemia    Hypertension    Morbid obesity ([place])    Sleep apnea     Past Surgical History:  Procedure Laterality Date   BREATH TEK H PYLORI  11/11/2011   Procedure: BREATH TEK H PYLORI;  Surgeon: Shann Medal, MD;  Location: [facility] ENDOSCOPY;  Service: General;  Laterality: N/A;   CARDIAC CATHETERIZATION     ESOPHAGOGASTRODUODENOSCOPY (EGD) WITH PROPOFOL N/A 05/04/2014   Procedure: ESOPHAGOGASTRODUODENOSCOPY (EGD) WITH  PROPOFOL;  Surgeon: Cleotis Nipper, MD;  Location: [facility] ENDOSCOPY;  Service: Endoscopy;  Laterality: N/A;    Current Medications: Current Meds  Medication Sig   acetaminophen (TYLENOL) 325 MG tablet Take 1-2 tablets (325-650 mg total) by mouth every 4 (four) hours as needed for mild pain.   albuterol (VENTOLIN HFA) 108 (90 Base) MCG/ACT inhaler Inhale 2 puffs into the lungs every 6 (six) hours as needed for wheezing or  shortness of breath. MAP pharmacy   aspirin 81 MG chewable tablet Chew 1 tablet (81 mg total) by mouth daily.   carvedilol (COREG) 25 MG tablet Take 1 tablet (25 mg total) by mouth 2 (two) times daily with a meal.   chlorthalidone (HYGROTON) 25 MG tablet Take 1 tablet (25 mg total) by mouth daily.   empagliflozin (JARDIANCE) 25 MG TABS tablet Take 1 tablet (25 mg total) by mouth daily.   insulin glargine (LANTUS SOLOSTAR) 100 UNIT/ML Solostar Pen Inject 16 Units into the skin 2 (two) times daily.   liraglutide (VICTOZA) 18 MG/3ML SOPN Inject 1.8 mg into the skin daily.   losartan (COZAAR) 25 MG tablet Take 1 tablet (25 mg total) by mouth daily.   rosuvastatin (CRESTOR) 40 MG tablet Take 1 tablet (40 mg total) by mouth daily.   vitamin C (ASCORBIC ACID) 500 MG tablet Take 1 tablet (500 mg total) by mouth daily.   Vitamin D3 (VITAMIN D) 25 MCG tablet Take 1 tablet (1,000 Units total) by mouth daily.     Allergies:   Erythromycin   Social History   Socioeconomic History   Marital status: Single    Spouse name: Not on file   Number of children: Not on file   Years of education: Not on file   Highest education level: Not on file  Occupational History   Occupation: Beautician  Tobacco Use   Smoking status: Never   Smokeless tobacco: Never  Vaping Use   Vaping Use: Never used  Substance and Sexual Activity   Alcohol use: No    Alcohol/week: 0.0 standard drinks   Drug use: No   Sexual activity: Not on file  Other Topics Concern   Not on file  Social History Narrative   Not on file   Social Determinants of Health   Financial Resource Strain: Not on file  Food Insecurity: Not on file  Transportation Needs: Not on file  Physical Activity: Not on file  Stress: Not on file  Social Connections: Not on file     Family History: The patient's family history includes Cancer in his maternal aunt, maternal uncle, and mother; Colon cancer in his brother; Diabetes in his maternal aunt  and mother; Heart failure in his brother, mother, and sister; Stroke in his father.  ROS:   Please see the history of present illness.    (+) Nausea (+) Emesis (+) Lightheadedness (+) Blurry vision (+) Imbalance/Gait instability (+) Bilateral LE pain (+) Abdominal tenderness (+) Constipation All other systems reviewed and are negative.  EKGs/Labs/Other Studies Reviewed:    The following studies were reviewed today:  Echo 03/04/2021:  1. Left ventricular ejection fraction, by estimation, is 50%. The left  ventricle has low normal function. The left ventricle has no regional wall  motion abnormalities. There is mild left ventricular hypertrophy. Left  ventricular diastolic parameters are   indeterminate.   2. Right ventricular systolic function is normal. The right ventricular  size is normal. Tricuspid regurgitation signal is inadequate for assessing  PA pressure.  3. The mitral valve is normal in structure. Trivial mitral valve  regurgitation. No evidence of mitral stenosis.   4. The aortic valve is grossly normal. Aortic valve regurgitation is not  visualized. No aortic stenosis is present.   5. The inferior vena cava is normal in size with greater than 50%  respiratory variability, suggesting right atrial pressure of 3 mmHg.   Conclusion(s)/Recommendation(s): No intracardiac source of embolism  detected on this transthoracic study. A transesophageal echocardiogram is recommended to exclude cardiac source of embolism if clinically indicated.   CT-A Head and Neck 03/02/2021: IMPRESSION: 1. No emergent large vessel occlusion. 2. 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 3. Severe stenosis of the proximal left external carotid artery. 4. 3.6 cm incidental right thyroid nodule. Recommend thyroid US. Reference: J Am Coll Radiol. 2015 Feb;12(2): 143-50  EKG:    05/07/2021: Sinus rhythm. Rate 87 bpm. PVCs. LAFB.  LVH.  Recent Labs: 03/03/2021: B Natriuretic Peptide 225.9; TSH 1.331 03/08/2021: ALT 36 03/12/2021: Hemoglobin 13.4; Platelets 205 03/22/2021: BUN 24; Creatinine, Ser 1.22; Potassium 3.5; Sodium 138   Recent Lipid Panel    Component Value Date/Time   CHOL 340 (H) 03/03/2021 0500   CHOL 300 (H) 04/26/2020 1014   TRIG 182 (H) 03/03/2021 0500   HDL 33 (L) 03/03/2021 0500   HDL 29 (L) 04/26/2020 1014   CHOLHDL 10.3 03/03/2021 0500   VLDL 36 03/03/2021 0500   LDLCALC 271 (H) 03/03/2021 0500   LDLCALC 220 (H) 04/26/2020 1014       Physical Exam:    Wt Readings from Last 3 Encounters:  05/07/21 235 lb 4.8 oz (106.7 kg)  04/19/21 241 lb (109.3 kg)  03/22/21 239 lb (108.4 kg)     VS:  BP 134/72 (BP Location: Left Arm)    Pulse 87    Ht 5\' 6"  (1.676 m)    Wt 235 lb 4.8 oz (106.7 kg)    SpO2 97%    BMI 37.98 kg/m  , BMI Body mass index is 37.98 kg/m. GENERAL:  Well appearing HEENT: Pupils equal round and reactive, fundi not visualized, oral mucosa unremarkable NECK:  No jugular venous distention, waveform within normal limits, carotid upstroke brisk and symmetric, no bruits, no thyromegaly LUNGS:  Clear to auscultation bilaterally HEART:  RRR.  PMI not displaced or sustained,S1 and S2 within normal limits, no S3, no S4, no clicks, no rubs, murmurs ABD:  Flat, positive bowel sounds normal in frequency in pitch, no bruits, no rebound, no guarding, no midline pulsatile mass, no hepatomegaly, no splenomegaly EXT:  2 plus pulses throughout, no edema, no cyanosis no clubbing SKIN:  No rashes no nodules NEURO:  Cranial nerves II through XII grossly intact, motor grossly intact throughout PSYCH:  Cognitively intact, oriented to person place and time  ASSESSMENT:    1. Dyspnea on exertion   2. Chronic HFrEF (heart failure with reduced ejection fraction) ([place])   3. Essential hypertension   4. Familial hyperlipidemia   5. Morbid obesity ([place])   6. Cerebrovascular accident (CVA) due to  thrombosis of left posterior cerebral artery (HCC)    PLAN:    Chronic HFrEF (heart failure with reduced ejection fraction) (HCC) LVEF was 38% and has improved to 50%.  He is euvolemic.  There was a concern of ischemic cardiomyopathy but he reportedly had normal coronaries on cath.  He has no ischemic symptoms.  Continue carvedilol, Jardiance, and losartan.  Plan to switch to [facility] if BP  tolerates.  Essential hypertension BP reasonably well-controlled here but has been labile at home.  He will track his BP at home and bring to follow up.  If his BP will allow we will switch losartan to Entresto.  Continue carvedilol, chlorthalidone, and start taking meds after eating to increase tolerability.   Familial hyperlipidemia LDL was 271 and he has multiple family members who developed hyperlipidemia and ASCVD at a young age.  He was started on rosuvastatin in the hospital.  He will come back for repeat lipids/CMP.  He will start taking rosuvastatin at night to see if that improves tolerability.  If LDL remains >70 or if he doesn't tolerate, plan to start a PCSK9 inhibitor.  Morbid obesity ([place]) Will plan for PREP referral once balance improves and he finishes PT.   Cerebrovascular accident (CVA) due to thrombosis of left posterior cerebral artery (HCC) Embolic stroke noted in the hospital.  He has residual gait instability.  Thought to be to to intracranial stenoses.  Clopidogrel has been discontinued and he is on aspirin.  Lipid control as above.   Disposition: FU with PharmD in 1 month. FU with Ceasar Decandia C. Oval Linsey, MD, [facility] in 4 months.  Medication Adjustments/Labs and Tests Ordered: Current medicines are reviewed at length with the patient today.  Concerns regarding medicines are outlined above.   Orders Placed This Encounter  Procedures   EKG 12-Lead   No orders of the defined types were placed in this encounter.  Patient Instructions  Medication Instructions:  TAKE- Crestor at  night  *If you need a refill on your cardiac medications before your next appointment, please call your pharmacy*   Lab Work: None Ordered   Testing/Procedures: None Ordered   Follow-Up: At [facility], you and your health needs are our priority.  As part of our continuing mission to provide you with exceptional heart care, we have created designated Provider Care Teams.  These Care Teams include your primary Cardiologist (physician) and Advanced Practice Providers (APPs -  Physician Assistants and Nurse Practitioners) who all work together to provide you with the care you need, when you need it.  We recommend signing up for the patient portal called "MyChart".  Sign up information is provided on this After Visit Summary.  MyChart is used to connect with patients for Virtual Visits (Telemedicine).  Patients are able to view lab/test results, encounter notes, upcoming appointments, etc.  Non-urgent messages can be sent to your provider as well.   To learn more about what you can do with MyChart, go to [email or web address].    Your next appointment:   4 month(s)  The format for your next appointment:   In Person  Provider:   Skeet Latch, MD    Other Instructions 1 Month with PharmD  Check blood pressure twice a day and bring reading at next office visit.     I,Mathew Stumpf,acting as a Education administrator for Skeet Latch, MD.,have documented all relevant documentation on the behalf of Skeet Latch, MD,as directed by  Skeet Latch, MD while in the presence of Skeet Latch, MD.  I, [place] Oval Linsey, MD have reviewed all documentation for this visit.  The documentation of the exam, diagnosis, procedures, and orders on 05/07/2021 are all accurate and complete.   Signed, Skeet Latch, MD  05/07/2021 5:53 PM    [place]

## 2021-05-07 NOTE — Assessment & Plan Note (Signed)
LDL was 271 and he has multiple family members who developed hyperlipidemia and ASCVD at a young age.  He was started on rosuvastatin in the hospital.  He will come back for repeat lipids/CMP.  He will start taking rosuvastatin at night to see if that improves tolerability.  If LDL remains >70 or if he doesn't tolerate, plan to start a PCSK9 inhibitor.

## 2021-05-07 NOTE — Assessment & Plan Note (Signed)
Embolic stroke noted in the hospital.  He has residual gait instability.  Thought to be to to intracranial stenoses.  Clopidogrel has been discontinued and he is on aspirin.  Lipid control as above.

## 2021-05-08 ENCOUNTER — Ambulatory Visit: Payer: Medicare Other | Admitting: Physical Therapy

## 2021-05-08 VITALS — BP 102/67 | HR 94

## 2021-05-08 DIAGNOSIS — R2689 Other abnormalities of gait and mobility: Secondary | ICD-10-CM

## 2021-05-08 NOTE — Therapy (Signed)
[facility] [address] [place], [place], [address] Phone: [phone]   Fax:  [phone]  Patient Details  Name: Thomas Gardner MRN: [id number] Date of Birth: Sep 06, 1955 Referring Provider:  Cathlyn Parsons, PA-C  Encounter Date: 05/08/2021   Pt arrived for PT - stated he was very tired today. Stated he took BP medication this AM around 10:30 - BP was 97/64 in LUE (recorded with automatic cuff).  Pt requested to cancel today's PT session - will plan on PT as scheduled on Thursday, 05-10-21.    Arrived - no charge for today's session  Valincia Touch, [place], PT 05/08/2021, 1:30 PM  [facility] [address] [place], [place], [address] Phone: [phone]   Fax:  [phone]

## 2021-05-10 ENCOUNTER — Encounter: Payer: Self-pay | Admitting: Physical Therapy

## 2021-05-10 ENCOUNTER — Ambulatory Visit: Payer: Medicare Other | Admitting: Physical Therapy

## 2021-05-10 ENCOUNTER — Other Ambulatory Visit: Payer: Self-pay

## 2021-05-10 VITALS — BP 93/58 | HR 89

## 2021-05-10 DIAGNOSIS — R2681 Unsteadiness on feet: Secondary | ICD-10-CM

## 2021-05-10 DIAGNOSIS — R2689 Other abnormalities of gait and mobility: Secondary | ICD-10-CM

## 2021-05-10 NOTE — Therapy (Addendum)
[facility] [address] [place], [place], [address] Phone: [phone]   Fax:  [phone]  Physical Therapy Treatment  Patient Details  Name: Thomas Gardner MRN: [id number] Date of Birth: 1956-01-29 Referring Provider (PT): Jethro Bolus, PA-C   Encounter Date: 05/10/2021   05/10/21 1524  PT Visits / Re-Eval  Visit Number 7  Number of Visits 17  Date for PT Re-Evaluation 05/23/21  Authorization  Authorization Type [employer], $30 copay  PT Time Calculation  PT Start Time 1103  PT Stop Time 9417  PT Time Calculation (min) 42 min  PT - End of Session  Activity Tolerance Patient tolerated treatment well  Behavior During Therapy [facility] for tasks assessed/performed      Past Medical History:  Diagnosis Date   Acute stroke of medulla oblongata ([place]) 03/07/2021   Arthritis    Asthma    CHF (congestive heart failure) ([place])    Diabetes mellitus 2003   Familial hyperlipidemia 05/07/2021   GERD (gastroesophageal reflux disease)    Hyperlipidemia    Hypertension    Morbid obesity ([place])    Sleep apnea     Past Surgical History:  Procedure Laterality Date   BREATH TEK H PYLORI  11/11/2011   Procedure: BREATH TEK H PYLORI;  Surgeon: Shann Medal, MD;  Location: [facility];  Service: General;  Laterality: N/A;   CARDIAC CATHETERIZATION     ESOPHAGOGASTRODUODENOSCOPY (EGD) WITH PROPOFOL N/A 05/04/2014   Procedure: ESOPHAGOGASTRODUODENOSCOPY (EGD) WITH PROPOFOL;  Surgeon: Cleotis Nipper, MD;  Location[facility] ENDOSCOPY;  Service: Endoscopy;  Laterality: N/A;    Vitals:   05/10/21 1108 05/10/21 1140 05/10/21 1142 05/10/21 1143  BP: 116/67 (!) 94/59 109/72 (!) 93/58  Pulse: 89                                  Balance Exercises - 05/16/21 1710       Balance Exercises: Standing   Standing Eyes Opened Narrow base of support (BOS);Head turns;Foam/compliant surface;5 reps   horizontal and  vertical head turns   Standing Eyes Closed Wide (BOA);Head turns;Foam/compliant surface;5 reps   horizontal and vertical head turns 5 reps each   Standing Eyes Closed Limitations RLE noted to tremor with prolonged standing on pillows    SLS Eyes open;5 reps   standing on Airex inside // bars   Rockerboard Anterior/posterior;EO;EC;Other reps (comment);Intermittent UE support   10 reps EO, then 10 reps with EC   Marching Foam/compliant surface;Static   10 reps each LE with UE support on chair in front prn   Other Standing Exercises Pt performed braiding inside // bars 10'x 2 reps without UE support - cues for sequence    Other Standing Exercises Comments Pt performed marching on incline - EO - 5 reps without head turns; added horizontal head turns 5 reps, then vertical head turns 5 reps; standing with EC 10 secs on incline; head turns 5 reps horizontal with EC and then vertical EC 5 reps; alternating stepping forward/back 5 reps each leg on incline with CGA for safety with balance                   PT Short Term Goals - 05/16/21 1659       PT SHORT TERM GOAL #1   Title Patient will be able to drive without significant issues of dizziness or motion sensitivity (all goals to be met  by 04/25/21)    Baseline Has not driven since stroke (eval) 12/12- pt reports being able to drive w/ a slight head tilt but still gets woozy w/ sitting straight up or at night w/ headlights    Time 4    Period Weeks    Status Partially Met    Target Date 04/25/21      PT SHORT TERM GOAL #2   Title Pt will be able to ambulate without st. cane in community to improve community ambulation    Baseline pt uses st. cane for community and no cane at home (eval) 12/12- pt reports his cane use has diminished now to about 50% of the time especially w/ unlevel surface walking    Time 4    Period Weeks    Status Partially Met               PT Long Term Goals - 05/16/21 1659       PT LONG TERM GOAL #1    Title Pt will demo >20/30 on FGA to improve overall functional gait and balance and mobility (all LTG due by 05/23/21)    Baseline 13/30 (eval)    Time 8    Period Weeks    Status New      PT LONG TERM GOAL #2   Title Pt will be able to perform 5x sit to stand without HHA <12 seconds to improve functional strength with transfers    Baseline 17 sec (eval)    Time 8    Period Weeks    Status New      PT LONG TERM GOAL #3   Title Pt will demo at least 110/120 sec on Modified CTSIB to improve balance and proprioception with stading balance    Baseline 83/120 sec (eval)    Time 8    Period Weeks    Status New      PT LONG TERM GOAL #4   Title Pt will demo >1 m/s gait speed to improve community ambulation    Baseline 0.28ms without AD    Time 8    Period Weeks    Status New              05/10/21 1109  Plan  Clinical Impression Statement Skilled PT session focused on balance activities requiring increased vestibular input including activities with vision removed.  Pt's LE's noted to tremor with prolonged standing activities on compliant surfaces.  Pt is progressing well towards goals. Cont with POC.  PT Next Visit Plan Continue high level balance, dyanmic gait. eyes closed activities.  Consulted and Agree with Plan of Care Patient         Patient will benefit from skilled therapeutic intervention in order to improve the following deficits and impairments:     Visit Diagnosis: Other abnormalities of gait and mobility  Unsteadiness on feet     Problem List Patient Active Problem List   Diagnosis Date Noted   Familial hyperlipidemia 05/07/2021   Cerebrovascular accident (CVA) due to thrombosis of left posterior cerebral artery ([place] 04/19/2021   Thyroid nodule 03/23/2021   Hiatal hernia 03/23/2021   CVA (cerebral vascular accident) ([place] 03/03/2021   Post-nasal drip 08/18/2020   Fatigue 04/26/2020   Epidermal inclusion cyst 04/03/2020   Generalized anxiety  disorder 03/27/2020   Neck mass 01/19/2020   Epigastric pain 04/20/2019   Diabetes mellitus due to underlying condition with hyperosmolarity without coma, with long-term current use of insulin ([place] 04/19/2019  History of phimosis of penis 04/07/2018   Sciatica associated with disorder of lumbosacral spine 08/07/2016   Sleep apnea 02/01/2016   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 [place], [place], [address] Phone: [phone]   Fax:  [phone]  Name: Thomas Gardner MRN: [id number] Date of Birth: Sep 04, 1955

## 2021-05-15 ENCOUNTER — Encounter: Payer: Self-pay | Admitting: Physical Therapy

## 2021-05-15 ENCOUNTER — Ambulatory Visit: Payer: Medicare Other | Admitting: Physical Therapy

## 2021-05-15 ENCOUNTER — Other Ambulatory Visit: Payer: Self-pay

## 2021-05-15 VITALS — BP 121/68 | HR 91

## 2021-05-15 DIAGNOSIS — R2681 Unsteadiness on feet: Secondary | ICD-10-CM | POA: Diagnosis not present

## 2021-05-15 DIAGNOSIS — R2689 Other abnormalities of gait and mobility: Secondary | ICD-10-CM

## 2021-05-16 NOTE — Progress Notes (Signed)
°   05/10/21 1109  Plan  Clinical Impression Statement Skilled PT session focused on balance activities requiring increased vestibular input including activities with vision removed.  Pt's LE's noted to tremor with prolonged standing activities on compliant surfaces.  Pt is progressing well towards goals. Cont with POC.  PT Next Visit Plan Continue high level balance, dyanmic gait. eyes closed activities.  Consulted and Agree with Plan of Care Patient

## 2021-05-16 NOTE — Progress Notes (Signed)
°   05/10/21 1524  PT Visits / Re-Eval  Visit Number 7  Number of Visits 17  Date for PT Re-Evaluation 05/23/21  Authorization  Authorization Type UHC, $30 copay  PT Time Calculation  PT Start Time 1103  PT Stop Time 1145  PT Time Calculation (min) 42 min  PT - End of Session  Activity Tolerance Patient tolerated treatment well  Behavior During Therapy [facility] for tasks assessed/performed

## 2021-05-16 NOTE — Therapy (Signed)
[facility] [address] [place], [place], [address] Phone: [phone]   Fax:  [phone]  Physical Therapy Treatment  Patient Details  Name: Thomas Gardner MRN: [id number] Date of Birth: 08-Jan-1956 Referring Provider (PT): Jethro Bolus, PA-C   Encounter Date: 05/15/2021   PT End of Session - 05/16/21 1653     Visit Number 8    Number of Visits 17    Date for PT Re-Evaluation 05/23/21    Authorization Type UHC, $30 copay    PT Start Time 1316    PT Stop Time 1400    PT Time Calculation (min) 44 min    Activity Tolerance Patient tolerated treatment well    Behavior During Therapy [facility] for tasks assessed/performed             Past Medical History:  Diagnosis Date   Acute stroke of medulla oblongata ([place]) 03/07/2021   Arthritis    Asthma    CHF (congestive heart failure) ([place])    Diabetes mellitus 2003   Familial hyperlipidemia 05/07/2021   GERD (gastroesophageal reflux disease)    Hyperlipidemia    Hypertension    Morbid obesity ([place])    Sleep apnea     Past Surgical History:  Procedure Laterality Date   BREATH TEK H PYLORI  11/11/2011   Procedure: BREATH TEK H PYLORI;  Surgeon: Shann Medal, MD;  Location: [facility];  Service: General;  Laterality: N/A;   CARDIAC CATHETERIZATION     ESOPHAGOGASTRODUODENOSCOPY (EGD) WITH PROPOFOL N/A 05/04/2014   Procedure: ESOPHAGOGASTRODUODENOSCOPY (EGD) WITH PROPOFOL;  Surgeon: Cleotis Nipper, MD;  Location: [facility];  Service: Endoscopy;  Laterality: N/A;    Vitals:   05/15/21 1323 05/15/21 1326 05/15/21 1335  BP: (!) 103/53 (!) 101/48 121/68  Pulse: 92 91      Subjective Assessment - 05/15/21 1344     Subjective Pt states he saw Dr.Shaw last week - states he said the light-headedness is coming from the medications - trying to figure out how to take the meds during the day so not to take entire dosage at one time so he doesn't feel as loopy;  states the medications Jardien and Losartan have this side effect    Pertinent History bil knee pain (meniscus injuries but no surgeries); recent stroke    Limitations Walking;House hold activities    How long can you sit comfortably? no issues    How long can you stand comfortably? no issues    Patient Stated Goals resolve dizziness    Currently in Pain? No/denies    Pain Onset More than a month ago                               [facility] Adult PT Treatment/Exercise - 05/16/21 0001       High Level Balance   High Level Balance Activities Backward walking;Head turns;Tandem walking;Marching forwards                 Balance Exercises - 05/16/21 0001       Balance Exercises: Standing   Standing Eyes Closed Wide (BOA);Head turns;Foam/compliant surface;5 reps   horizontal and vertical head turns   Standing Eyes Closed Limitations RLE noted to tremor with standing on compliant surfaces and also with challenging activities    SLS Foam/compliant surface   cone taps standing on blue mat on floor  - to 2 cones - straight ahead  and then diagonals 2 reps each foot without UE support   Rockerboard Anterior/posterior;EO;EC;Other reps (comment);Intermittent UE support   10 reps EO, then 10 reps with EC   Gait with Head Turns Forward;2 reps   40' x 2 reps; horizontal head turns 1 rep, vertical head turns 1 rep   Marching Foam/compliant surface;Static   10 reps each LE with UE support on chair in front prn   Other Standing Exercises Pt performed marching on blue mat with EO 5 reps; EC 5 reps: head turns side to side 5 reps with EO and then with EC; vertical EO horizontal 5 reps and vertical 5 reps EO    Other Standing Exercises Comments Pt performed standing alternating forward, back and side kicks 5 reps each leg each direction with CGA - standing on blue mat on floor                  PT Short Term Goals - 05/16/21 1659       PT SHORT TERM GOAL #1   Title Patient  will be able to drive without significant issues of dizziness or motion sensitivity (all goals to be met by 04/25/21)    Baseline Has not driven since stroke (eval) 12/12- pt reports being able to drive w/ a slight head tilt but still gets woozy w/ sitting straight up or at night w/ headlights    Time 4    Period Weeks    Status Partially Met    Target Date 04/25/21      PT SHORT TERM GOAL #2   Title Pt will be able to ambulate without st. cane in community to improve community ambulation    Baseline pt uses st. cane for community and no cane at home (eval) 12/12- pt reports his cane use has diminished now to about 50% of the time especially w/ unlevel surface walking    Time 4    Period Weeks    Status Partially Met               PT Long Term Goals - 05/16/21 1659       PT LONG TERM GOAL #1   Title Pt will demo >20/30 on FGA to improve overall functional gait and balance and mobility (all LTG due by 05/23/21)    Baseline 13/30 (eval)    Time 8    Period Weeks    Status New      PT LONG TERM GOAL #2   Title Pt will be able to perform 5x sit to stand without HHA <12 seconds to improve functional strength with transfers    Baseline 17 sec (eval)    Time 8    Period Weeks    Status New      PT LONG TERM GOAL #3   Title Pt will demo at least 110/120 sec on Modified CTSIB to improve balance and proprioception with stading balance    Baseline 83/120 sec (eval)    Time 8    Period Weeks    Status New      PT LONG TERM GOAL #4   Title Pt will demo >1 m/s gait speed to improve community ambulation    Baseline 0.30ms without AD    Time 8    Period Weeks    Status New                   Plan - 05/16/21 1654     Clinical Impression Statement Pt  continues to have most difficulty with maintaining balance on compliant surface with EC, indicative of decreased vestibular input in maintaining balance.  Pt able to amb. with horizontal head turns with only mild postural  sway.  Pt did well maintaining balance with SLS activity of cone taps standing on blue mat.  Pt's BP noted to continue to fluctuate throughout PT session.  Cont with POC.    PT Next Visit Plan Continue high level balance, dyanmic gait. eyes closed activities.    Consulted and Agree with Plan of Care Patient             Patient will benefit from skilled therapeutic intervention in order to improve the following deficits and impairments:     Visit Diagnosis: Unsteadiness on feet  Other abnormalities of gait and mobility     Problem List Patient Active Problem List   Diagnosis Date Noted   Familial hyperlipidemia 05/07/2021   Cerebrovascular accident (CVA) due to thrombosis of left posterior cerebral artery ([place]) 04/19/2021   Thyroid nodule 03/23/2021   Hiatal hernia 03/23/2021   CVA (cerebral vascular accident) ([place]) 03/03/2021   Post-nasal drip 08/18/2020   Fatigue 04/26/2020   Epidermal inclusion cyst 04/03/2020   Generalized anxiety disorder 03/27/2020   Neck mass 01/19/2020   Epigastric pain 04/20/2019   Diabetes mellitus due to underlying condition with hyperosmolarity without coma, with long-term current use of insulin ([place]) 04/19/2019   History of phimosis of penis 04/07/2018   Sciatica associated with disorder of lumbosacral spine 08/07/2016   Sleep apnea 02/01/2016   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

## 2021-05-17 ENCOUNTER — Ambulatory Visit: Payer: Medicare Other

## 2021-05-17 ENCOUNTER — Other Ambulatory Visit: Payer: Self-pay

## 2021-05-17 ENCOUNTER — Other Ambulatory Visit (HOSPITAL_COMMUNITY): Payer: Self-pay

## 2021-05-17 VITALS — BP 90/56 | HR 95

## 2021-05-17 DIAGNOSIS — R2689 Other abnormalities of gait and mobility: Secondary | ICD-10-CM

## 2021-05-17 DIAGNOSIS — M6281 Muscle weakness (generalized): Secondary | ICD-10-CM

## 2021-05-17 DIAGNOSIS — R42 Dizziness and giddiness: Secondary | ICD-10-CM

## 2021-05-17 DIAGNOSIS — R2681 Unsteadiness on feet: Secondary | ICD-10-CM | POA: Diagnosis not present

## 2021-05-17 MED ORDER — ICOSAPENT ETHYL 1 G PO CAPS
2.0000 g | ORAL_CAPSULE | Freq: Two times a day (BID) | ORAL | 3 refills | Status: DC
  Filled 2021-05-17 – 2021-07-11 (×2): qty 360, 90d supply, fill #0

## 2021-05-17 NOTE — Therapy (Signed)
[facility] [address] [place], [place], [address] Phone: [phone]   Fax:  [phone]  Physical Therapy Treatment  Patient Details  Name: Thomas Gardner MRN: [id number] Date of Birth: Aug 22, 1955 Referring Provider (PT): Jethro Bolus, PA-C   Encounter Date: 05/17/2021   PT End of Session - 05/17/21 1243     Visit Number 9    Number of Visits 17    Date for PT Re-Evaluation 05/23/21    Authorization Type UHC, $30 copay    PT Start Time 1232    PT Stop Time 1517    PT Time Calculation (min) 33 min    Activity Tolerance Patient tolerated treatment well    Behavior During Therapy [facility] for tasks assessed/performed             Past Medical History:  Diagnosis Date   Acute stroke of medulla oblongata ([place]) 03/07/2021   Arthritis    Asthma    CHF (congestive heart failure) ([place])    Diabetes mellitus 2003   Familial hyperlipidemia 05/07/2021   GERD (gastroesophageal reflux disease)    Hyperlipidemia    Hypertension    Morbid obesity ([place])    Sleep apnea     Past Surgical History:  Procedure Laterality Date   BREATH TEK H PYLORI  11/11/2011   Procedure: BREATH TEK H PYLORI;  Surgeon: Shann Medal, MD;  Location: [facility];  Service: General;  Laterality: N/A;   CARDIAC CATHETERIZATION     ESOPHAGOGASTRODUODENOSCOPY (EGD) WITH PROPOFOL N/A 05/04/2014   Procedure: ESOPHAGOGASTRODUODENOSCOPY (EGD) WITH PROPOFOL;  Surgeon: Cleotis Nipper, MD;  Location[facility];  Service: Endoscopy;  Laterality: N/A;    Vitals:   05/17/21 1239 05/17/21 1248  BP: (!) 93/56 (!) 90/56  Pulse: 95      Subjective Assessment - 05/17/21 1234     Subjective Reports blood pressure has still fluctuated. Reports he thinks lightheadedness is coming from the medications. Yesterday was very nauseous and vomitted approx 4 times.    Pertinent History bil knee pain (meniscus injuries but no surgeries); recent stroke     Limitations Walking;House hold activities    How long can you sit comfortably? no issues    How long can you stand comfortably? no issues    Patient Stated Goals resolve dizziness    Currently in Pain? No/denies    Pain Onset --                [facility] Adult PT Treatment/Exercise - 05/17/21 0001       Transfers   Transfers Sit to Stand;Stand to Sit    Sit to Stand 6: Modified independent (Device/Increase time)    Stand to Sit 6: Modified independent (Device/Increase time)      Ambulation/Gait   Ambulation/Gait Yes    Ambulation/Gait Assistance 5: Supervision    Ambulation/Gait Assistance Details throughout therapy gym without AD    Ambulation Distance (Feet) --   clinic distance   Assistive device None    Gait Pattern Decreased arm swing - left;Antalgic;Wide base of support;Poor foot clearance - left    Ambulation Surface Level;Indoor      Self-Care   Self-Care Other Self-Care Comments    Other Self-Care Comments  PT educating on continue to monitor BP, provided AHA Blood Pressure Monitoring sheets to allow patient to accurately track readings, as reports it continues to fluctuate. Also educated on proper time of day to monitor. Pt continue to report lack of energy and  lightheadedness, PT educating that the low BP readings can excerabate these symptoms. Pt would like go on hold for 3 weeks until can see Neurologist and MD/Pharmacist regarding medications and BP. PT in agreement.      Exercises   Exercises Knee/Hip      Knee/Hip Exercises: Aerobic   Other Aerobic Completed SciFit with BUE/BLE on Level 2.0 x 5 minutes, with PT monitoring vitals. BP after completion: 90/56, HR: 102 bpm. Patient feeling more lightheadedness and fatigue with physical activity. Per patient request with held additional PT treatment               PT Education - 05/17/21 1307     Education Details continue to monitor BP; provided AHA Blood Pressure Monitoring Sheets; pt request to place PT On  hold until see MD/pharmacist    Person(s) Educated Patient    Methods Explanation;Handout    Comprehension Verbalized understanding              PT Short Term Goals - 05/16/21 1659       PT SHORT TERM GOAL #1   Title Patient will be able to drive without significant issues of dizziness or motion sensitivity (all goals to be met by 04/25/21)    Baseline Has not driven since stroke (eval) 12/12- pt reports being able to drive w/ a slight head tilt but still gets woozy w/ sitting straight up or at night w/ headlights    Time 4    Period Weeks    Status Partially Met    Target Date 04/25/21      PT SHORT TERM GOAL #2   Title Pt will be able to ambulate without st. cane in community to improve community ambulation    Baseline pt uses st. cane for community and no cane at home (eval) 12/12- pt reports his cane use has diminished now to about 50% of the time especially w/ unlevel surface walking    Time 4    Period Weeks    Status Partially Met               PT Long Term Goals - 05/16/21 1659       PT LONG TERM GOAL #1   Title Pt will demo >20/30 on FGA to improve overall functional gait and balance and mobility (all LTG due by 05/23/21)    Baseline 13/30 (eval)    Time 8    Period Weeks    Status New      PT LONG TERM GOAL #2   Title Pt will be able to perform 5x sit to stand without HHA <12 seconds to improve functional strength with transfers    Baseline 17 sec (eval)    Time 8    Period Weeks    Status New      PT LONG TERM GOAL #3   Title Pt will demo at least 110/120 sec on Modified CTSIB to improve balance and proprioception with stading balance    Baseline 83/120 sec (eval)    Time 8    Period Weeks    Status New      PT LONG TERM GOAL #4   Title Pt will demo >1 m/s gait speed to improve community ambulation    Baseline 0.44ms without AD    Time 8    Period Weeks    Status New                   Plan - 05/17/21  1313     Clinical Impression  Statement Pt reporting continued issues with Blood Pressure readings. Patietn consistently low upon assessment today, and decreased with physical activity. With decrease in BP, patient reporting increased in lightheadedness and fatigue. Rest of session focused on education on BP monitoring, how to compensate for low BP readings, and placing PT on hold until can be further evaluated/assessed by MD. Will continue per POC upon return.    PT Next Visit Plan On hold for 3 weeks. Will need LTG check and Re-Cert upon return.    Consulted and Agree with Plan of Care Patient             Patient will benefit from skilled therapeutic intervention in order to improve the following deficits and impairments:     Visit Diagnosis: Unsteadiness on feet  Other abnormalities of gait and mobility  Dizziness and giddiness  Muscle weakness (generalized)     Problem List Patient Active Problem List   Diagnosis Date Noted   Familial hyperlipidemia 05/07/2021   Cerebrovascular accident (CVA) due to thrombosis of left posterior cerebral artery ([place]) 04/19/2021   Thyroid nodule 03/23/2021   Hiatal hernia 03/23/2021   CVA (cerebral vascular accident) ([place]) 03/03/2021   Post-nasal drip 08/18/2020   Fatigue 04/26/2020   Epidermal inclusion cyst 04/03/2020   Generalized anxiety disorder 03/27/2020   Neck mass 01/19/2020   Epigastric pain 04/20/2019   Diabetes mellitus due to underlying condition with hyperosmolarity without coma, with long-term current use of insulin ([place]) 04/19/2019   History of phimosis of penis 04/07/2018   Sciatica associated with disorder of lumbosacral spine 08/07/2016   Sleep apnea 02/01/2016   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   Healthcare maintenance 03/17/2015   Gout 08/08/2014   Hyperlipidemia 06/15/2014   Chronic HFrEF (heart failure with reduced ejection fraction) ([place]) 05/03/2014   Diabetes mellitus ([place]) 05/03/2014   Essential hypertension 05/03/2014   Morbid  obesity ([place]) 10/10/2011    Jones Bales, PT, DPT 05/17/2021, 1:15 PM  [facility] [address] [place], [place], [address] Phone: [phone]   Fax:  [phone]  Name: Thomas Gardner MRN: [id number] Date of Birth: November 28, 1955

## 2021-05-18 ENCOUNTER — Other Ambulatory Visit (HOSPITAL_COMMUNITY): Payer: Self-pay

## 2021-05-22 ENCOUNTER — Ambulatory Visit: Payer: Medicare Other

## 2021-05-23 ENCOUNTER — Encounter: Payer: Self-pay | Admitting: Adult Health

## 2021-05-23 ENCOUNTER — Ambulatory Visit: Payer: Medicare Other | Admitting: Adult Health

## 2021-05-23 VITALS — BP 139/78 | HR 88 | Ht 66.0 in | Wt 234.0 lb

## 2021-05-23 DIAGNOSIS — I639 Cerebral infarction, unspecified: Secondary | ICD-10-CM | POA: Diagnosis not present

## 2021-05-23 DIAGNOSIS — Z09 Encounter for follow-up examination after completed treatment for conditions other than malignant neoplasm: Secondary | ICD-10-CM | POA: Diagnosis not present

## 2021-05-23 NOTE — Patient Instructions (Addendum)
Continue working with therapies   Continue to follow with PCP and cardiology regarding blood pressure fluctuations and associated symptoms   Continue aspirin 81 mg daily  and Crestor and Zetia  for secondary stroke prevention  Continue to follow up with PCP regarding cholesterol, blood pressure and diabetes management  Maintain strict control of hypertension with blood pressure goal below 130/90, diabetes with hemoglobin A1c goal below 7.0 % and cholesterol with LDL cholesterol (bad cholesterol) goal below 70 mg/dL.   Signs of a Stroke? Follow the BEFAST method:  Balance Watch for a sudden loss of balance, trouble with coordination or vertigo Eyes Is there a sudden loss of vision in one or both eyes? Or double vision?  Face: Ask the person to smile. Does one side of the face droop or is it numb?  Arms: Ask the person to raise both arms. Does one arm drift downward? Is there weakness or numbness of a leg? Speech: Ask the person to repeat a simple phrase. Does the speech sound slurred/strange? Is the person confused ? Time: If you observe any of these signs, call 911.    Followup in the future with me in 3-4 months or call earlier if needed       Thank you for coming to see [place] at [facility]. I hope we have been able to provide you high quality care today.  You may receive a patient satisfaction survey over the next few weeks. We would appreciate your feedback and comments so that we may continue to improve ourselves and the health of our patients.  Stroke Prevention Some medical conditions and lifestyle choices can lead to a higher risk for a stroke. You can help to prevent a stroke by eating healthy foods and exercising. It also helps to not smoke and to manage any health problems you may have. How can this condition affect me? A stroke is an emergency. It should be treated right away. A stroke can lead to brain damage or threaten your life. There is a better  chance of surviving and getting better after a stroke if you get medical help right away. What can increase my risk? The following medical conditions may increase your risk of a stroke: Diseases of the heart and blood vessels (cardiovascular disease). High blood pressure (hypertension). Diabetes. High cholesterol. Sickle cell disease. Problems with blood clotting. Being very overweight. Sleeping problems (obstructivesleep apnea). Other risk factors include: Being older than age 45. A history of blood clots, stroke, or mini-stroke (TIA). Race, ethnic background, or a family history of stroke. Smoking or using tobacco products. Taking birth control pills, especially if you smoke. Heavy alcohol and drug use. Not being active. What actions can I take to prevent this? Manage your health conditions High cholesterol. Eat a healthy diet. If this is not enough to manage your cholesterol, you may need to take medicines. Take medicines as told by your doctor. High blood pressure. Try to keep your blood pressure below 130/80. If your blood pressure cannot be managed through a healthy diet and regular exercise, you may need to take medicines. Take medicines as told by your doctor. Ask your doctor if you should check your blood pressure at home. Have your blood pressure checked every year. Diabetes. Eat a healthy diet and get regular exercise. If your blood sugar (glucose) cannot be managed through diet and exercise, you may need to take medicines. Take medicines as told by your doctor. Talk to your doctor about getting checked for  sleeping problems. Signs of a problem can include: Snoring a lot. Feeling very tired. Make sure that you manage any other conditions you have. Nutrition  Follow instructions from your doctor about what to eat or drink. You may be told to: Eat and drink fewer calories each day. Limit how much salt (sodium) you use to 1,500 milligrams (mg) each day. Use only  healthy fats for cooking, such as olive oil, canola oil, and sunflower oil. Eat healthy foods. To do this: Choose foods that are high in fiber. These include whole grains, and fresh fruits and vegetables. Eat at least 5 servings of fruits and vegetables a day. Try to fill one-half of your plate with fruits and vegetables at each meal. Choose low-fat (lean) proteins. These include low-fat cuts of meat, chicken without skin, fish, tofu, beans, and nuts. Eat low-fat dairy products. Avoid foods that: Are high in salt. Have saturated fat. Have trans fat. Have cholesterol. Are processed or pre-made. Count how many carbohydrates you eat and drink each day. Lifestyle If you drink alcohol: Limit how much you have to: 0-1 drink a day for women who are not pregnant. 0-2 drinks a day for men. Know how much alcohol is in your drink. In the [place] one drink equals one 12 oz bottle of beer (340mL), one 5 oz glass of wine (160mL), or one 1 oz glass of hard liquor (40mL). Do not smoke or use any products that have nicotine or tobacco. If you need help quitting, ask your doctor. Avoid secondhand smoke. Do not use drugs. Activity  Try to stay at a healthy weight. Get at least 30 minutes of exercise on most days, such as: Fast walking. Biking. Swimming. Medicines Take over-the-counter and prescription medicines only as told by your doctor. Avoid taking birth control pills. Talk to your doctor about the risks of taking birth control pills if: You are over 72 years old. You smoke. You get very bad headaches. You have had a blood clot. Where to find more information American Stroke Association: www.strokeassociation.org Get help right away if: You or a loved one has any signs of a stroke. "BE FAST" is an easy way to remember the warning signs: B - Balance. Dizziness, sudden trouble walking, or loss of balance. E - Eyes. Trouble seeing or a change in how you see. F - Face. Sudden weakness or loss  of feeling of the face. The face or eyelid may droop on one side. A - Arms. Weakness or loss of feeling in an arm. This happens all of a sudden and most often on one side of the body. S - Speech. Sudden trouble speaking, slurred speech, or trouble understanding what people say. T - Time. Time to call emergency services. Write down what time symptoms started. You or a loved one has other signs of a stroke, such as: A sudden, very bad headache with no known cause. Feeling like you may vomit (nausea). Vomiting. A seizure. These symptoms may be an emergency. Get help right away. Call your local emergency services (911 in the [place] Do not wait to see if the symptoms will go away. Do not drive yourself to the hospital. Summary You can help to prevent a stroke by eating healthy, exercising, and not smoking. It also helps to manage any health problems you have. Do not smoke or use any products that contain nicotine or tobacco. Get help right away if you or a loved one has any signs of a stroke. This information is  not intended to replace advice given to you by your health care provider. Make sure you discuss any questions you have with your health care provider. Document Revised: 12/06/2019 Document Reviewed: 12/06/2019 Elsevier Patient Education  [facility].

## 2021-05-23 NOTE — Progress Notes (Signed)
I agree with the above plan 

## 2021-05-23 NOTE — Progress Notes (Signed)
[name] [address] [place]. [place] [address] [phone]       HOSPITAL FOLLOW UP NOTE  [name] Date of Birth:  1955-05-31 Medical Record Number:  [id number]   Reason for Referral:  hospital stroke follow up    SUBJECTIVE:   CHIEF COMPLAINT:  Chief Complaint  Patient presents with   Follow-up    RM 2 alone Pt is well, having some nausea and vomiting when taking meds. Tingling in heads/feet and buttocks he thinks may be neuropathy but not sure. BP drops with activity     HPI:   Thomas Gardner is a 66 y.o. male with PMH significant for DM2, HLD, HTN, Morbid obesity, OSA who presented on 03/02/2021 with vertigo that started on the evening of 02/28/21. Also has posterior headache that started after wisdom tooth was removed. Initially came to the ED on 03/01/21 and MRI Was read as normal. It does show a small left meduallary stroke on DWI imaging. He came in today with intense persistent dizziness.  Present reviewed hospitalization pertinent progress notes, lab work and imaging.  Evaluated by 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  EF 50%.  LDL 271 -noncompliant on Crestor and Zetia therefore resumed at discharge.  A1c 9.9.  Recommended DAPT for 3 weeks and aspirin alone.  Advised outpatient follow-up with cardiology for acute on chronic diastolic heart failure.  PT/OT eval recommended CIR for ongoing therapy needs.   Today, 05/23/2021, patient being seen for initial hospital follow-up unaccompanied. Overall doing well from stroke standpoint. Working with NR PT but currently on hold due to low blood pressure issues. Blood pressure monitored at home and typically around 150s/90s but at times can be higher (per report) then during exertion (or with PT) he will  become symptomatically hypotensive.  He is currently working with cardiology and PCP regarding these concerns, scheduled to see cardiac pharmacist 1/19.  Occasional N/V after certain medications but improving. Lives alone. Able to maintain ADLs independently and gradually returning to prior IADLs.  Drives short distance without difficulty.  Denies new stroke/TIA symptoms.  Compliant on aspirin, Crestor and Zetia without side effects. Prior difficulty tolerating Crestor but currently denies.  Blood pressure today 139/78. Does report intermittent b/l toes and finger tip numbness as well as right leg (side, front/groin and buttock, and perineum numbness/odd sensation. He believes SE from [place] as similar symptoms on a similar medication - he plans on further discussing with cardiac pharm. Denies weakness, pain, or loss of bladder or bowel control.  No further concerns at this time    PERTINENT IMAGING  Per recent hospitalization Code Stroke CT head: No acute intracranial abnormalities. Mild chronic cerebral atrophy and small vessel ischemic changes. CTA head & neck  1. No emergent large vessel occlusion. 2. 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 3. Severe stenosis of the proximal left external carotid artery. 4. 3.6 cm incidental right thyroid nodule.  MRI   Punctate focus of abnormal diffusion restriction in the left medullary pyramid. This may represent a small focus of acute ischemia. 2D Echo  1. Left ventricular ejection fraction, by estimation, is 50%. The left  ventricle has low normal function. The left ventricle has no regional wall  motion abnormalities. There is mild left ventricular hypertrophy.  Left  ventricular diastolic parameters are   indeterminate.   2. Right ventricular systolic function is normal. The right ventricular  size is normal. Tricuspid regurgitation signal is inadequate for assessing  PA pressure.   3. The  mitral valve is normal in structure. Trivial mitral valve  regurgitation. No evidence of mitral stenosis.   4. The aortic valve is grossly normal. Aortic valve regurgitation is not  visualized. No aortic stenosis is present.   5. The inferior vena cava is normal in size with greater than 50%  respiratory variability, suggesting right atrial pressure of 3 mmHg.  No atrial level shunt detected by color flow Doppler. LDL 271 HgbA1c 9.9    ROS:   14 system review of systems performed and negative with exception of those listed in HPI  PMH:  Past Medical History:  Diagnosis Date   Acute stroke of medulla oblongata ([place]) 03/07/2021   Arthritis    Asthma    CHF (congestive heart failure) ([place])    Diabetes mellitus 2003   Familial hyperlipidemia 05/07/2021   GERD (gastroesophageal reflux disease)    Hyperlipidemia    Hypertension    Morbid obesity ([place])    Sleep apnea     PSH:  Past Surgical History:  Procedure Laterality Date   BREATH TEK H PYLORI  11/11/2011   Procedure: BREATH TEK H PYLORI;  Surgeon: Shann Medal, MD;  Location: [facility];  Service: General;  Laterality: N/A;   CARDIAC CATHETERIZATION     ESOPHAGOGASTRODUODENOSCOPY (EGD) WITH PROPOFOL N/A 05/04/2014   Procedure: ESOPHAGOGASTRODUODENOSCOPY (EGD) WITH PROPOFOL;  Surgeon: Cleotis Nipper, MD;  Location: [facility];  Service: Endoscopy;  Laterality: N/A;    Social History:  Social History   Socioeconomic History   Marital status: Single    Spouse name: Not on file   Number of children: Not on file   Years of education: Not on file   Highest education level: Not on file  Occupational History   Occupation: Beautician  Tobacco Use   Smoking status: Never   Smokeless tobacco: Never  Vaping Use   Vaping Use: Never used  Substance and Sexual Activity   Alcohol use: No    Alcohol/week: 0.0 standard drinks   Drug use: No   Sexual activity: Not on file  Other Topics Concern   Not on file  Social  History Narrative   Not on file   Social Determinants of Health   Financial Resource Strain: Not on file  Food Insecurity: Not on file  Transportation Needs: Not on file  Physical Activity: Not on file  Stress: Not on file  Social Connections: Not on file  Intimate Partner Violence: Not on file    Family History:  Family History  Problem Relation Age of Onset   Heart failure Mother    Cancer Mother        ovarian   Diabetes Mother    Stroke Father    Heart failure Sister    Heart failure Brother    Colon cancer Brother    Cancer Maternal Aunt        breast   Diabetes Maternal Aunt    Cancer Maternal Uncle        colon    Medications:   Current Outpatient Medications on File Prior to Visit  Medication Sig Dispense Refill   acetaminophen (TYLENOL) 325 MG tablet Take 1-2 tablets (325-650 mg total) by mouth every 4 (four) hours as needed for mild pain.  albuterol (VENTOLIN HFA) 108 (90 Base) MCG/ACT inhaler Inhale 2 puffs into the lungs every 6 (six) hours as needed for wheezing or shortness of breath. MAP pharmacy 25.5 g 1   aspirin 81 MG chewable tablet Chew 1 tablet (81 mg total) by mouth daily. 90 tablet 1   carvedilol (COREG) 25 MG tablet Take 1 tablet (25 mg total) by mouth 2 (two) times daily with a meal. 180 tablet 1   chlorthalidone (HYGROTON) 25 MG tablet Take 1 tablet (25 mg total) by mouth daily. 90 tablet 1   empagliflozin (JARDIANCE) 25 MG TABS tablet Take 1 tablet (25 mg total) by mouth daily. 90 tablet 1   icosapent Ethyl (VASCEPA) 1 g capsule Take 2 capsules (2 g total) by mouth 2 (two) times daily with food, swallowing whole. Do not chew, open, dissolve and/or crush. 360 capsule 3   insulin glargine (LANTUS SOLOSTAR) 100 UNIT/ML Solostar Pen Inject 16 Units into the skin 2 (two) times daily. 12 mL 2   liraglutide (VICTOZA) 18 MG/3ML SOPN Inject 1.8 mg into the skin daily. 21 mL 3   losartan (COZAAR) 25 MG tablet Take 1 tablet (25 mg total) by mouth daily.  90 tablet 1   omeprazole (PRILOSEC) 40 MG capsule Take 1 capsule (40 mg total) by mouth daily. 90 capsule 0   rosuvastatin (CRESTOR) 40 MG tablet Take 1 tablet (40 mg total) by mouth daily. 90 tablet 1   vitamin C (ASCORBIC ACID) 500 MG tablet Take 1 tablet (500 mg total) by mouth daily. 30 tablet 0   Vitamin D3 (VITAMIN D) 25 MCG tablet Take 1 tablet (1,000 Units total) by mouth daily. 30 tablet 0   No current facility-administered medications on file prior to visit.    Allergies:   Allergies  Allergen Reactions   Erythromycin Nausea And Vomiting      OBJECTIVE:  Physical Exam  Vitals:   05/23/21 1314  BP: 139/78  Pulse: 88  Weight: 234 lb (106.1 kg)  Height: 5\' 6"  (1.676 m)   Body mass index is 37.77 kg/m. No results found.  Post stroke PHQ 2/9 Depression screen PHQ 2/9 04/19/2021  Decreased Interest 0  Down, Depressed, Hopeless 0  PHQ - 2 Score 0  Altered sleeping 0  Tired, decreased energy 0  Change in appetite 0  Feeling bad or failure about yourself  0  Trouble concentrating 0  Moving slowly or fidgety/restless 0  Suicidal thoughts 0  PHQ-9 Score 0  Difficult doing work/chores Not difficult at all  Some recent data might be hidden     General: Morbidly obese very pleasant middle-aged African-American male, seated, in no evident distress Head: head normocephalic and atraumatic.   Neck: supple with no carotid or supraclavicular bruits Cardiovascular: regular rate and rhythm, no murmurs Musculoskeletal: no deformity Skin:  no rash/petichiae Vascular:  Normal pulses all extremities   Neurologic Exam Mental Status: Awake and fully alert.  Fluent speech and language.  Oriented to place and time. Recent and remote memory intact. Attention span, concentration and fund of knowledge appropriate. Mood and affect appropriate.  Cranial Nerves: Fundoscopic exam reveals sharp disc margins. Pupils equal, briskly reactive to light. Extraocular movements full without  nystagmus. Visual fields full to confrontation. Hearing intact. Facial sensation intact. Face, tongue, palate moves normally and symmetrically.  Motor: Normal bulk and tone. Normal strength in all tested extremity muscles Sensory.: intact to touch , pinprick , position and vibratory sensation.  Coordination: Rapid alternating movements normal in all  extremities. Finger-to-nose and heel-to-shin performed accurately bilaterally. Gait and Station: Arises from chair without difficulty. Stance is wide-based. Gait demonstrates mild unsteadiness initially but quickly resolves with normal stride length and balance without use of AD.  Reflexes: 1+ and symmetric. Toes downgoing.     NIHSS  0 Modified Rankin  3      ASSESSMENT: Thomas Gardner is a 66 y.o. year old male with recent left lateral medullary infarct on 03/02/2021 likely secondary to small vessel disease. Vascular risk factors include uncontrolled DM, HTN, HLD, morbid obesity, OSA, L ECA stenosis, and HRrER.      PLAN:  L lateral medullary infarct :  Continued dizziness, imbalance and nausea/vomitting but unable to fully tell if this is residual stroke deficit vs labile blood pressure (typically SBP 150s but will drop to SBP 90s with exertion) vs medication SE. Continue working with PT and close PCP/cardiology follow up for BP management Continue aspirin 81 mg daily  and Crestor and Zetia  for secondary stroke prevention.   Discussed secondary stroke prevention measures and importance of close PCP follow up for aggressive stroke risk factor management. I have gone over the pathophysiology of stroke, warning signs and symptoms, risk factors and their management in some detail with instructions to go to the closest emergency room for symptoms of concern. HTN: BP goal <130/90.  Uncontrolled and labile - closely monitored by PCP HLD: LDL goal <70. Recent LDL 271.  Continue Crestor 40 mg daily and Zetia 10 mg daily. PCP plans on repeat lipid  panel next month. If remains uncontrolled, would recommend consideration of PCSK9 due to prior statin intolerance DMII: A1c goal<7.0. Recent A1c 9.9.  Managed by PCP Carotid stenosis: Followed by cardiology    Follow up in 3-4 months or call earlier if needed   CC:  [place] provider: Dr. Leonie Man PCP: Scarlett Presto, MD    I spent 59 minutes of face-to-face and non-face-to-face time with patient.  This included previsit chart review including review of recent hospitalization, lab review, study review, electronic health record documentation, patient education regarding recent stroke including etiology, secondary stroke prevention measures and importance of managing stroke risk factors, residual deficits and typical recovery time, multiple other concerns as noted above and answered all other questions to patient satisfaction  Frann Rider, AGNP-BC  [facility] [address] [place], [place] [address]  Phone [phone] Fax [phone] Note: This document was prepared with digital dictation and possible smart phrase technology. Any transcriptional errors that result from this process are unintentional.

## 2021-05-24 ENCOUNTER — Ambulatory Visit: Payer: Medicare Other | Admitting: Physical Therapy

## 2021-05-28 ENCOUNTER — Other Ambulatory Visit (HOSPITAL_COMMUNITY): Payer: Self-pay

## 2021-05-31 ENCOUNTER — Encounter: Payer: Medicare Other | Admitting: Physical Therapy

## 2021-06-07 ENCOUNTER — Ambulatory Visit: Payer: Medicare Other | Admitting: Physical Therapy

## 2021-06-07 ENCOUNTER — Ambulatory Visit: Payer: Medicare Other | Admitting: Pharmacist Clinician (PhC)/ Clinical Pharmacy Specialist

## 2021-06-07 ENCOUNTER — Other Ambulatory Visit: Payer: Self-pay

## 2021-06-07 VITALS — BP 140/82 | HR 89 | Resp 15 | Ht 66.0 in | Wt 233.6 lb

## 2021-06-07 DIAGNOSIS — I5022 Chronic systolic (congestive) heart failure: Secondary | ICD-10-CM | POA: Diagnosis not present

## 2021-06-07 DIAGNOSIS — E7849 Other hyperlipidemia: Secondary | ICD-10-CM

## 2021-06-07 DIAGNOSIS — I10 Essential (primary) hypertension: Secondary | ICD-10-CM | POA: Diagnosis not present

## 2021-06-07 DIAGNOSIS — Z794 Long term (current) use of insulin: Secondary | ICD-10-CM

## 2021-06-07 DIAGNOSIS — I63332 Cerebral infarction due to thrombosis of left posterior cerebral artery: Secondary | ICD-10-CM

## 2021-06-07 DIAGNOSIS — E11628 Type 2 diabetes mellitus with other skin complications: Secondary | ICD-10-CM

## 2021-06-07 DIAGNOSIS — E782 Mixed hyperlipidemia: Secondary | ICD-10-CM

## 2021-06-07 LAB — LIPID PANEL
Chol/HDL Ratio: 5.3 ratio — ABNORMAL HIGH (ref 0.0–5.0)
Cholesterol, Total: 175 mg/dL (ref 100–199)
HDL: 33 mg/dL — ABNORMAL LOW (ref >39)
LDL Chol Calc (NIH): 112 mg/dL — ABNORMAL HIGH (ref 0–99)
Triglycerides: 167 mg/dL — ABNORMAL HIGH (ref 0–149)
VLDL Cholesterol Cal: 30 mg/dL (ref 5–40)

## 2021-06-07 LAB — HEPATIC FUNCTION PANEL
ALT: 12 IU/L (ref 0–44)
AST: 10 IU/L (ref 0–40)
Albumin: 4.5 g/dL (ref 3.8–4.8)
Alkaline Phosphatase: 76 IU/L (ref 44–121)
Bilirubin Total: 0.3 mg/dL (ref 0.0–1.2)
Bilirubin, Direct: <0.1 mg/dL (ref 0.00–0.40)
Total Protein: 7.3 g/dL (ref 6.0–8.5)

## 2021-06-07 NOTE — Assessment & Plan Note (Signed)
Continue losartan 25 mg daily, carvedilol 25 mg BID, and jardiance 25 mg daily. Once patient has started enteric coated aspirin for 3-4 days (as mentioned above), will trial stopping Jardiance to see if this medication is causing him his dizzy, lightheadedness, and tingling feelings. SGLT2i may not be an option for him. Losartan not switched to Entresto at this time but will be an option for a future visit. Patient would need 24/26 mg BID due to being on a low potency ARB (losartan 25 mg). No washout period needed.

## 2021-06-07 NOTE — Progress Notes (Signed)
06/07/2021 Thomas Gardner 05/03/1956 [id number]   HPI: CH is a 66 y/o male with a history of hypertension, hyperlipidemia, chronic diastolic heart failure, diabetes, and stroke. He is here today for his one month BP check up. He was admitted in 02/2021 with an acute ischemic stroke where he had severe stenosis of the proximal left external carotid artery and multifocal moderate to severe stenoses of the right posterior cerebral artery. He was treated with aspirin and plavix for three months, and then continued aspirin alone. During this admission is ECHO showed a LVEF of 50% which is improved from 35-40% in 11/2020. His ECHO also showed indeterminate diastolic dysfunction. He has been working with physical therapy since his stroke and is supposed to continue with them until the end of January. At his previous visit with Dr. Oval Linsey on 05/07/2021 he reported feeling nauseous and vomiting after taking his morning medications. After vomiting, he feels lightheaded but not dizzy and his vision is blurry for a few seconds as well. He is considered his antihypertensives are making him feel this way and reported he began breaking his tablets in half. His blood pressure on 05/07/2021 with Dr. Oval Linsey was 134/72. More recently, he saw neurology on 05/23/2021 with a blood pressure of 139/78. At his neurology appt, he reported his N/V after his medications was improving and noticed that his blood pressure is higher when doing PT. He reported bilateral toe and finger numbness as well as right leg numbness/odd sensation. He believes this is because of his Vania Rea because he had similar symptoms on a similar medication.   Patient reports feeling good for the most part but his blood pressure has been labile. Has not been to physical therapy in the last 1-2 weeks because when he goes his BP would drop significantly. Physical therapists were concerned and would send him home. He is supposed to start back once blood  pressure is more stable. He has been feeling dizzy and lightheaded in the mornings about an hour after taking medications. Has been breaking all morning and evening blood pressure medications (besides chlorthalidone) in halves and taking them in multiple doses to try to help with this. Believes his jardiance that is causing him to be dizzy, lightheaded in the morning about an hour afterwards.   PMH: CVA - Crestor 40 mg, aspirin 81 mg CHF - LVEF 50% (02/2021) - losartan 25 mg, carvedilol 25 mg, jardiance 25 mg Diabetes - Jardiance 25 mg, Victoza 1.8 mg SQ daily, Lantus Familial hyperlipidemia - Crestor 40 mg  Hypertension - losartan 25 mg, carvedilol 25 mg, chlorthalidone 25 mg  Blood Pressure Goal < 130/80   Current Medications: Losartan 25 mg daily  Chlorthalidone 25 mg daily Carvedilol 25 mg BID  Family Hx: Mother - heart failure (passed at 42); one brother has CHF; other brother some undiagnosed heart problem, has a lot of fluid around the heart; one sister has CHF; other sister healthy; no children  Social Hx:  No tobacco, no alcohol, no coffee, green tea and turmeric tea, very rarely drinks sweet tea and lemonade. No soft  drinks   Diet: Fruit, oatmeal, [place] sausage, cheerios and raisin bran, eats late at night, saltine crackers or applesauce with Lantus, broccoli, green beans, turnip greens, carrots, salads.  Eats at home about 50% of the time. Baked chicken from [employer].   Exercise:  Was going to PT twice a week but has stopped until after he sees [place] to get blood pressure more  stable  In-Clinic Reading: 140/82  Intolerances: Erythromycin - nausea and vomiting   Labs: 03/22/2021: Na 138, K 3.5, BUN 24, SCr 1.22, eGFR 66, Glu 240 03/03/2021: TC 340, TG 182, HDL 33, LDL 271  03/03/2021: A1c 9.9   Wt Readings from Last 3 Encounters:  06/07/21 233 lb 9.6 oz (106 kg)  05/23/21 234 lb (106.1 kg)  05/07/21 235 lb 4.8 oz (106.7 kg)   BP Readings from Last 3 Encounters:   06/07/21 140/82  05/23/21 139/78  05/17/21 (!) 90/56   Pulse Readings from Last 3 Encounters:  06/07/21 89  05/23/21 88  05/17/21 95    Current Outpatient Medications  Medication Sig Dispense Refill   acetaminophen (TYLENOL) 325 MG tablet Take 1-2 tablets (325-650 mg total) by mouth every 4 (four) hours as needed for mild pain.     albuterol (VENTOLIN HFA) 108 (90 Base) MCG/ACT inhaler Inhale 2 puffs into the lungs every 6 (six) hours as needed for wheezing or shortness of breath. MAP pharmacy 25.5 g 1   aspirin 81 MG chewable tablet Chew 1 tablet (81 mg total) by mouth daily. 90 tablet 1   carvedilol (COREG) 25 MG tablet Take 1 tablet (25 mg total) by mouth 2 (two) times daily with a meal. 180 tablet 1   chlorthalidone (HYGROTON) 25 MG tablet Take 1 tablet (25 mg total) by mouth daily. 90 tablet 1   empagliflozin (JARDIANCE) 25 MG TABS tablet Take 1 tablet (25 mg total) by mouth daily. 90 tablet 1   insulin glargine (LANTUS SOLOSTAR) 100 UNIT/ML Solostar Pen Inject 16 Units into the skin 2 (two) times daily. 12 mL 2   liraglutide (VICTOZA) 18 MG/3ML SOPN Inject 1.8 mg into the skin daily. 21 mL 3   losartan (COZAAR) 25 MG tablet Take 1 tablet (25 mg total) by mouth daily. 90 tablet 1   omeprazole (PRILOSEC) 40 MG capsule Take 1 capsule (40 mg total) by mouth daily. 90 capsule 0   rosuvastatin (CRESTOR) 40 MG tablet Take 1 tablet (40 mg total) by mouth daily. 90 tablet 1   vitamin C (ASCORBIC ACID) 500 MG tablet Take 1 tablet (500 mg total) by mouth daily. 30 tablet 0   Vitamin D3 (VITAMIN D) 25 MCG tablet Take 1 tablet (1,000 Units total) by mouth daily. 30 tablet 0   icosapent Ethyl (VASCEPA) 1 g capsule Take 2 capsules (2 g total) by mouth 2 (two) times daily with food, swallowing whole. Do not chew, open, dissolve and/or crush. (Patient not taking: Reported on 06/07/2021) 360 capsule 3   No current facility-administered medications for this visit.    Allergies  Allergen Reactions    Erythromycin Nausea And Vomiting    Past Medical History:  Diagnosis Date   Acute stroke of medulla oblongata ([place]) 03/07/2021   Arthritis    Asthma    CHF (congestive heart failure) ([place])    Diabetes mellitus 2003   Familial hyperlipidemia 05/07/2021   GERD (gastroesophageal reflux disease)    Hyperlipidemia    Hypertension    Morbid obesity ([place])    Sleep apnea     Blood pressure 140/82, pulse 89, resp. rate 15, height 5' 6" (1.676 m), weight 233 lb 9.6 oz (106 kg), SpO2 98 %.  Essential hypertension Blood pressure in-clinic today was 140/82. Patient will continue losartan 25 mg daily, chlorthalidone 25 mg daily, and carvedilol 25 mg BID. Did not make changes to regimen today due to his reports of dizziness, lightheadedness, and nausea/vomiting. Counseled  the patient that he should be fine to take his whole pills rather than cutting them and taking them at different times of the day. Counseled the patient on how to properly check his BP at home and to bring his cuff/monitor and home readings with him to the next visit. Patient will follow-up on February 16th @ 3:30.   Cerebrovascular accident (CVA) due to thrombosis of left posterior cerebral artery (HCC) Thought that the patients N/V may be due to chewable aspirin 81 mg with history of GI bleed. Recommended the patient to stop the chewable aspirin and wait 3-4 days to see how he feels. If he does not feel any different, he will start enteric coated aspirin 81 mg formulation.   Due to muscle pain with Crestor 40 mg, advised patient to stop Crestor 40 mg for one week. After one week, patient will start Crestor 20 mg daily if there is improvement in muscle aches.  Chronic HFrEF (heart failure with reduced ejection fraction) (HCC) Continue losartan 25 mg daily, carvedilol 25 mg BID, and jardiance 25 mg daily. Once patient has started enteric coated aspirin for 3-4 days (as mentioned above), will trial stopping Jardiance to see if this  medication is causing him his dizzy, lightheadedness, and tingling feelings. SGLT2i may not be an option for him. Losartan not switched to Entresto at this time but will be an option for a future visit. Patient would need 24/26 mg BID due to being on a low potency ARB (losartan 25 mg). No washout period needed.  Hyperlipidemia Reduced patient from Crestor 40 mg daily to Crestor 20 mg daily. LDL in 02/2021 was 271. Rechecked lipid panel today, will review results when available. Most likely will need additional therapy with PCSK9i. LDL goal < 55.   Diabetes mellitus (HCC) Continue Jardiance 25 mg daily, Victoza 1.8 mg SQ daily, and Lantus. Will need to switch Jardiance to another option in the future, if patient is intolerant, as his most recent A1c was 9.9 (02/2021).  Glee Arvin, PharmD Candidate [employer] of 2023  I was with patient and student throughout entire visit and agree with above assessment and plan.   Tommy Medal PharmD CPP [facility] [address] [place], [place] [address] [phone]

## 2021-06-07 NOTE — Assessment & Plan Note (Signed)
Reduced patient from Crestor 40 mg daily to Crestor 20 mg daily. LDL in 02/2021 was 271. Rechecked lipid panel today, will review results when available. Most likely will need additional therapy with PCSK9i. LDL goal < 55.

## 2021-06-07 NOTE — Assessment & Plan Note (Signed)
Thought that the patients N/V may be due to chewable aspirin 81 mg with history of GI bleed. Recommended the patient to stop the chewable aspirin and wait 3-4 days to see how he feels. If he does not feel any different, he will start enteric coated aspirin 81 mg formulation.   Due to muscle pain with Crestor 40 mg, advised patient to stop Crestor 40 mg for one week. After one week, patient will start Crestor 20 mg daily if there is improvement in muscle aches.

## 2021-06-07 NOTE — Assessment & Plan Note (Signed)
Blood pressure in-clinic today was 140/82. Patient will continue losartan 25 mg daily, chlorthalidone 25 mg daily, and carvedilol 25 mg BID. Did not make changes to regimen today due to his reports of dizziness, lightheadedness, and nausea/vomiting. Counseled the patient that he should be fine to take his whole pills rather than cutting them and taking them at different times of the day. Counseled the patient on how to properly check his BP at home and to bring his cuff/monitor and home readings with him to the next visit. Patient will follow-up on February 16th @ 3:30.

## 2021-06-07 NOTE — Patient Instructions (Addendum)
Return for a a follow up appointment February 16 at 2:30 pm   Go to the lab today to check cholesterol  Check your blood pressure at home daily and keep record of the readings.  Take your BP meds as follows:  Stop taking aspirin 81 mg.  If your nausea/stomach problems go away, then restart with enteric coated tablets.  If there is no change in how you feel after 5-7 days then restart with the enteric coated, wait a few days then stop Jardiance to see if that is the problem.     Stop rosuvastatin for 1 week.  Then restart at 20 mg daily (1/2 of 40 mg tablet).    Bring all of your meds, your BP cuff and your record of home blood pressures to your next appointment.  Exercise as youre able, try to walk approximately 30 minutes per day.  Keep salt intake to a minimum, especially watch canned and prepared boxed foods.  Eat more fresh fruits and vegetables and fewer canned items.  Avoid eating in fast food restaurants.    HOW TO TAKE YOUR BLOOD PRESSURE: Rest 5 minutes before taking your blood pressure.  Dont smoke or drink caffeinated beverages for at least 30 minutes before. Take your blood pressure before (not after) you eat. Sit comfortably with your back supported and both feet on the floor (dont cross your legs). Elevate your arm to heart level on a table or a desk. Use the proper sized cuff. It should fit smoothly and snugly around your bare upper arm. There should be enough room to slip a fingertip under the cuff. The bottom edge of the cuff should be 1 inch above the crease of the elbow. Ideally, take 3 measurements at one sitting and record the average.

## 2021-06-07 NOTE — Assessment & Plan Note (Signed)
Continue Jardiance 25 mg daily, Victoza 1.8 mg SQ daily, and Lantus. Will need to switch Jardiance to another option in the future, if patient is intolerant, as his most recent A1c was 9.9 (02/2021).

## 2021-06-07 NOTE — Progress Notes (Signed)
06/07/2021 Thomas Gardner 05/03/1956 [id number]   HPI: CH is a 66 y/o male with a history of hypertension, hyperlipidemia, chronic diastolic heart failure, diabetes, and stroke. He is here today for his one month BP check up. He was admitted in 02/2021 with an acute ischemic stroke where he had severe stenosis of the proximal left external carotid artery and multifocal moderate to severe stenoses of the right posterior cerebral artery. He was treated with aspirin and plavix for three months, and then continued aspirin alone. During this admission is ECHO showed a LVEF of 50% which is improved from 35-40% in 11/2020. His ECHO also showed indeterminate diastolic dysfunction. He has been working with physical therapy since his stroke and is supposed to continue with them until the end of January. At his previous visit with Dr. Oval Linsey on 05/07/2021 he reported feeling nauseous and vomiting after taking his morning medications. After vomiting, he feels lightheaded but not dizzy and his vision is blurry for a few seconds as well. He is considered his antihypertensives are making him feel this way and reported he began breaking his tablets in half. His blood pressure on 05/07/2021 with Dr. Oval Linsey was 134/72. More recently, he saw neurology on 05/23/2021 with a blood pressure of 139/78. At his neurology appt, he reported his N/V after his medications was improving and noticed that his blood pressure is higher when doing PT. He reported bilateral toe and finger numbness as well as right leg numbness/odd sensation. He believes this is because of his Vania Rea because he had similar symptoms on a similar medication.   Patient reports feeling good for the most part but his blood pressure has been labile. Has not been to physical therapy in the last 1-2 weeks because when he goes his BP would drop significantly. Physical therapists were concerned and would send him home. He is supposed to start back once blood  pressure is more stable. He has been feeling dizzy and lightheaded in the mornings about an hour after taking medications. Has been breaking all morning and evening blood pressure medications (besides chlorthalidone) in halves and taking them in multiple doses to try to help with this. Believes his jardiance that is causing him to be dizzy, lightheaded in the morning about an hour afterwards.   PMH: CVA - Crestor 40 mg, aspirin 81 mg CHF - LVEF 50% (02/2021) - losartan 25 mg, carvedilol 25 mg, jardiance 25 mg Diabetes - Jardiance 25 mg, Victoza 1.8 mg SQ daily, Lantus Familial hyperlipidemia - Crestor 40 mg  Hypertension - losartan 25 mg, carvedilol 25 mg, chlorthalidone 25 mg  Blood Pressure Goal < 130/80   Current Medications: Losartan 25 mg daily  Chlorthalidone 25 mg daily Carvedilol 25 mg BID  Family Hx: Mother - heart failure (passed at 42); one brother has CHF; other brother some undiagnosed heart problem, has a lot of fluid around the heart; one sister has CHF; other sister healthy; no children  Social Hx:  No tobacco, no alcohol, no coffee, green tea and turmeric tea, very rarely drinks sweet tea and lemonade. No soft  drinks   Diet: Fruit, oatmeal, [place] sausage, cheerios and raisin bran, eats late at night, saltine crackers or applesauce with Lantus, broccoli, green beans, turnip greens, carrots, salads.  Eats at home about 50% of the time. Baked chicken from [employer].   Exercise:  Was going to PT twice a week but has stopped until after he sees [place] to get blood pressure more  In-Clinic Reading: 140/82  Intolerances: Erythromycin - nausea and vomiting   Labs: 03/22/2021: Na 138, K 3.5, BUN 24, SCr 1.22, eGFR 66, Glu 240 03/03/2021: TC 340, TG 182, HDL 33, LDL 271  03/03/2021: A1c 9.9  Past Medical History:                   Blood Pressure Goal:  130/80  Current Medications:  Family Hx:  Social Hx:   Diet:   Exercise:   Home BP readings:    Intolerances:   Labs:    Wt Readings from Last 3 Encounters:  06/07/21 233 lb 9.6 oz (106 kg)  05/23/21 234 lb (106.1 kg)  05/07/21 235 lb 4.8 oz (106.7 kg)   BP Readings from Last 3 Encounters:  06/07/21 140/82  05/23/21 139/78  05/17/21 (!) 90/56   Pulse Readings from Last 3 Encounters:  06/07/21 89  05/23/21 88  05/17/21 95    Current Outpatient Medications  Medication Sig Dispense Refill   acetaminophen (TYLENOL) 325 MG tablet Take 1-2 tablets (325-650 mg total) by mouth every 4 (four) hours as needed for mild pain.     albuterol (VENTOLIN HFA) 108 (90 Base) MCG/ACT inhaler Inhale 2 puffs into the lungs every 6 (six) hours as needed for wheezing or shortness of breath. MAP pharmacy 25.5 g 1   aspirin 81 MG chewable tablet Chew 1 tablet (81 mg total) by mouth daily. 90 tablet 1   carvedilol (COREG) 25 MG tablet Take 1 tablet (25 mg total) by mouth 2 (two) times daily with a meal. 180 tablet 1   chlorthalidone (HYGROTON) 25 MG tablet Take 1 tablet (25 mg total) by mouth daily. 90 tablet 1   empagliflozin (JARDIANCE) 25 MG TABS tablet Take 1 tablet (25 mg total) by mouth daily. 90 tablet 1   insulin glargine (LANTUS SOLOSTAR) 100 UNIT/ML Solostar Pen Inject 16 Units into the skin 2 (two) times daily. 12 mL 2   liraglutide (VICTOZA) 18 MG/3ML SOPN Inject 1.8 mg into the skin daily. 21 mL 3   losartan (COZAAR) 25 MG tablet Take 1 tablet (25 mg total) by mouth daily. 90 tablet 1   omeprazole (PRILOSEC) 40 MG capsule Take 1 capsule (40 mg total) by mouth daily. 90 capsule 0   rosuvastatin (CRESTOR) 40 MG tablet Take 1 tablet (40 mg total) by mouth daily. 90 tablet 1   vitamin C (ASCORBIC ACID) 500 MG tablet Take 1 tablet (500 mg total) by mouth daily. 30 tablet 0   Vitamin D3 (VITAMIN D) 25 MCG tablet Take 1 tablet (1,000 Units total) by mouth daily. 30 tablet 0   icosapent Ethyl (VASCEPA) 1 g capsule Take 2 capsules (2 g total) by mouth 2 (two) times daily with food, swallowing  whole. Do not chew, open, dissolve and/or crush. (Patient not taking: Reported on 06/07/2021) 360 capsule 3   No current facility-administered medications for this visit.    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 rate 15, height _0  (1.676 m),  weight 233 lb 9.6 oz (106 kg), SpO2 98 %.  No problem-specific Assessment & Plan notes found for this encounter.   Tommy Medal PharmD CPP [facility] [address] [place], [place] [address] [phone]

## 2021-06-07 NOTE — Assessment & Plan Note (Deleted)
Reduced patient from Crestor 40 mg daily to Crestor 20 mg daily. LDL in 02/2021 was 271. Rechecked lipid panel today, will review results when available. Most likely will need additional therapy with PCSK9i. LDL goal < 55.

## 2021-06-11 ENCOUNTER — Other Ambulatory Visit: Payer: Self-pay | Admitting: Family Medicine

## 2021-06-11 DIAGNOSIS — R1900 Intra-abdominal and pelvic swelling, mass and lump, unspecified site: Secondary | ICD-10-CM

## 2021-06-25 ENCOUNTER — Encounter: Payer: Self-pay | Admitting: Internal Medicine

## 2021-07-04 ENCOUNTER — Other Ambulatory Visit (HOSPITAL_COMMUNITY): Payer: Self-pay

## 2021-07-04 ENCOUNTER — Other Ambulatory Visit: Payer: Self-pay

## 2021-07-05 ENCOUNTER — Other Ambulatory Visit: Payer: Self-pay

## 2021-07-05 ENCOUNTER — Other Ambulatory Visit (HOSPITAL_COMMUNITY): Payer: Self-pay

## 2021-07-05 ENCOUNTER — Ambulatory Visit: Payer: Medicare Other | Admitting: Pharmacist Clinician (PhC)/ Clinical Pharmacy Specialist

## 2021-07-05 ENCOUNTER — Encounter: Payer: Self-pay | Admitting: Pharmacist Clinician (PhC)/ Clinical Pharmacy Specialist

## 2021-07-05 VITALS — BP 140/80 | HR 91 | Resp 15 | Ht 66.0 in | Wt 237.7 lb

## 2021-07-05 DIAGNOSIS — I10 Essential (primary) hypertension: Secondary | ICD-10-CM | POA: Diagnosis not present

## 2021-07-05 DIAGNOSIS — E782 Mixed hyperlipidemia: Secondary | ICD-10-CM

## 2021-07-05 MED ORDER — LOSARTAN POTASSIUM 25 MG PO TABS
25.0000 mg | ORAL_TABLET | Freq: Two times a day (BID) | ORAL | 3 refills | Status: DC
Start: 2021-07-05 — End: 2021-09-03
  Filled 2021-07-05 (×2): qty 180, 90d supply, fill #0

## 2021-07-05 MED ORDER — ASPIRIN 81 MG PO TBEC
81.0000 mg | DELAYED_RELEASE_TABLET | Freq: Every day | ORAL | 3 refills | Status: DC
Start: 2021-07-05 — End: 2024-04-13
  Filled 2021-07-05 – 2022-02-07 (×2): qty 90, 90d supply, fill #0

## 2021-07-05 NOTE — Patient Instructions (Signed)
Return for a a follow up appointment with Dr. Oval Linsey in April  Go to the lab in 2 weeks to check kidney function  Check your blood pressure at home once or twice daily and keep record of the readings.  Take your BP meds as follows:  Increase losartan to 25 mg twice daily (or 12.5 mg four times daily)  Start Repatha 140 mg every 14 days ([place] will get this approved by the insurance early next week)  Continue with all other medications  Bring all of your meds, your BP cuff and your record of home blood pressures to your next appointment.  Exercise as youre able, try to walk approximately 30 minutes per day.  Keep salt intake to a minimum, especially watch canned and prepared boxed foods.  Eat more fresh fruits and vegetables and fewer canned items.  Avoid eating in fast food restaurants.    HOW TO TAKE YOUR BLOOD PRESSURE: Rest 5 minutes before taking your blood pressure.  Dont smoke or drink caffeinated beverages for at least 30 minutes before. Take your blood pressure before (not after) you eat. Sit comfortably with your back supported and both feet on the floor (dont cross your legs). Elevate your arm to heart level on a table or a desk. Use the proper sized cuff. It should fit smoothly and snugly around your bare upper arm. There should be enough room to slip a fingertip under the cuff. The bottom edge of the cuff should be 1 inch above the crease of the elbow. Ideally, take 3 measurements at one sitting and record the average.

## 2021-07-05 NOTE — Progress Notes (Signed)
07/09/2021 Thomas Gardner 1955/08/18 [id number]   HPI: CH is a 66 y/o male with a history of hypertension, hyperlipidemia, chronic diastolic heart failure, diabetes, and stroke. He is here today for his one month BP check up. He was admitted in 02/2021 with an acute ischemic stroke where he had severe stenosis of the proximal left external carotid artery and multifocal moderate to severe stenoses of the right posterior cerebral artery. He was treated with aspirin and plavix for three months, and then continued aspirin alone. During this admission is ECHO showed a LVEF of 50% which is improved from 35-40% in 11/2020. His ECHO also showed indeterminate diastolic dysfunction. He has been working with physical therapy since his stroke and is supposed to continue with them until the end of January. At his previous visit with Dr. Oval Linsey on 05/07/2021 he reported feeling nauseous and vomiting after taking his morning medications. After vomiting, he feels lightheaded but not dizzy and his vision is blurry for a few seconds as well. He is considered his antihypertensives are making him feel this way and reported he began breaking his tablets in half. His blood pressure on 05/07/2021 with Dr. Oval Linsey was 134/72. More recently, he saw neurology on 05/23/2021 with a blood pressure of 139/78. At his neurology appt, he reported his N/V after his medications was improving and noticed that his blood pressure is higher when doing PT. He reported bilateral toe and finger numbness as well as right leg numbness/odd sensation. He believes this is because of his Jardiance.   Because of some problems with dizziness, he had divided his losartan to 12.5 mg twice daily.  He also stopped taking chewable aspirin and instead went to enteric coated.  He notices that this helped with his prior GI issues.  He has a referral to urology, but has not yet been scheduled.  Has not yet started rosuvastatin.  Today he forgot to bring home  BP readings, but notes that they are mostly still elevated at home.  We have not been able to determine the accuracy of his home BP device.     PMH: CVA - Crestor 40 mg, aspirin 81 mg CHF - LVEF 50% (02/2021) - losartan 25 mg, carvedilol 25 mg, jardiance 25 mg Diabetes - Jardiance 25 mg, Victoza 1.8 mg SQ daily, Lantus Familial hyperlipidemia - Crestor 40 mg  Hypertension - losartan 25 mg, carvedilol 25 mg, chlorthalidone 25 mg  Blood Pressure Goal < 130/80   Current Medications: Losartan 25 mg daily  Chlorthalidone 25 mg daily Carvedilol 25 mg BID  Family Hx: Mother - heart failure (passed at 79); one brother has CHF; other brother some undiagnosed heart problem, has a lot of fluid around the heart; one sister has CHF; other sister healthy; no children  Social Hx:  No tobacco, no alcohol, no coffee, green tea and turmeric tea, very rarely drinks sweet tea and lemonade. No soft  drinks   Diet: Fruit, oatmeal, [place] sausage, cheerios and raisin bran, eats late at night, saltine crackers or applesauce with Lantus, broccoli, green beans, turnip greens, carrots, salads. Eats at home about 50% of the time. Baked chicken from [employer]. Has been drinking water with apple cider vinegar daily.  Exercise:  Was going to PT twice a week but has stopped until after he sees [place] to get blood pressure more stable  Intolerances: Erythromycin - nausea and vomiting   Labs: 1819/23:  TC 175, TG 167, HDL 33, LDL 112  03/22/2021: Na  138, K 3.5, BUN 24, SCr 1.22, eGFR 66, Glu 240 03/03/2021: A1c 9.9   Wt Readings from Last 3 Encounters:  07/05/21 237 lb 11.2 oz (107.8 kg)  06/07/21 233 lb 9.6 oz (106 kg)  05/23/21 234 lb (106.1 kg)   BP Readings from Last 3 Encounters:  07/05/21 140/80  06/07/21 140/82  05/23/21 139/78   Pulse Readings from Last 3 Encounters:  07/05/21 91  06/07/21 89  05/23/21 88    Current Outpatient Medications  Medication Sig Dispense Refill   acetaminophen  (TYLENOL) 325 MG tablet Take 1-2 tablets (325-650 mg total) by mouth every 4 (four) hours as needed for mild pain.     albuterol (VENTOLIN HFA) 108 (90 Base) MCG/ACT inhaler Inhale 2 puffs into the lungs every 6 (six) hours as needed for wheezing or shortness of breath. MAP pharmacy 25.5 g 1   carvedilol (COREG) 25 MG tablet Take 1 tablet (25 mg total) by mouth 2 (two) times daily with a meal. 180 tablet 1   chlorthalidone (HYGROTON) 25 MG tablet Take 1 tablet (25 mg total) by mouth daily. 90 tablet 1   empagliflozin (JARDIANCE) 25 MG TABS tablet Take 1 tablet (25 mg total) by mouth daily. 90 tablet 1   insulin glargine (LANTUS SOLOSTAR) 100 UNIT/ML Solostar Pen Inject 16 Units into the skin 2 (two) times daily. 12 mL 2   liraglutide (VICTOZA) 18 MG/3ML SOPN Inject 1.8 mg into the skin daily. 21 mL 3   losartan (COZAAR) 25 MG tablet Take 1 tablet (25 mg total) by mouth 2 (two) times daily. 180 tablet 3   omeprazole (PRILOSEC) 40 MG capsule Take 1 capsule (40 mg total) by mouth daily. 90 capsule 0   rosuvastatin (CRESTOR) 40 MG tablet Take 1 tablet (40 mg total) by mouth daily. 90 tablet 1   vitamin C (ASCORBIC ACID) 500 MG tablet Take 1 tablet (500 mg total) by mouth daily. 30 tablet 0   Vitamin D3 (VITAMIN D) 25 MCG tablet Take 1 tablet (1,000 Units total) by mouth daily. 30 tablet 0   aspirin 81 MG EC tablet Take 1 tablet (81 mg total) by mouth daily. Swallow whole. 90 tablet 3   icosapent Ethyl (VASCEPA) 1 g capsule Take 2 capsules (2 g total) by mouth 2 (two) times daily with food, swallowing whole. Do not chew, open, dissolve and/or crush. (Patient not taking: Reported on 07/05/2021) 360 capsule 3   No current facility-administered medications for this visit.    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 rate 15, height '5\' 6"'  (1.676 m), weight 237 lb 11.2 oz (107.8 kg), SpO2 98 %.  Essential hypertension Patient with essential hypertension, still not at goal.  Will have him increase losartan to 50 mg daily.  Patient would prefer to take 12.5 mg four times daily (or 25 mg twice daily), will adjust based on his ongoing problems with dizziness.  He should continue with regular home monitoring.  He has a follow up with Dr. Oval Linsey in April.  Hyperlipidemia Patient with familial hyperlipidemia, baseline LDL in the 200's.  LDL down to 112 on rosuvastatin 40, but not to goal of < 55 (history of CVA, DM2).  Reviewed options for lowering LDL cholesterol, including PCSK-9 inhibitors, bempedoic acid and inclisiran.  Discussed mechanisms of action, dosing, side effects and potential decreases in LDL cholesterol.  Also reviewed cost information and potential options for patient assistance.  Answered all patient questions.  Based on this information, patient would prefer to start PCSK-9 inhibitor.  We will start the process to get Repatha covered by insurance.  Once on medication he will take Repatha 140 mg q14d and repeat labs after 4-6 doses.       Tommy Medal PharmD CPP [facility] [address] [place], [place] [address] [phone]

## 2021-07-09 ENCOUNTER — Other Ambulatory Visit (HOSPITAL_COMMUNITY): Payer: Self-pay

## 2021-07-09 ENCOUNTER — Encounter: Payer: Self-pay | Admitting: Pharmacist Clinician (PhC)/ Clinical Pharmacy Specialist

## 2021-07-09 ENCOUNTER — Telehealth: Payer: Self-pay

## 2021-07-09 DIAGNOSIS — E785 Hyperlipidemia, unspecified: Secondary | ICD-10-CM

## 2021-07-09 MED ORDER — REPATHA SURECLICK 140 MG/ML ~~LOC~~ SOAJ
140.0000 mg | SUBCUTANEOUS | 11 refills | Status: DC
Start: 2021-07-09 — End: 2022-07-25
  Filled 2021-07-09: qty 2, 28d supply, fill #0
  Filled 2021-08-02 – 2021-08-16 (×2): qty 2, 28d supply, fill #1
  Filled 2021-09-11: qty 2, 28d supply, fill #2
  Filled 2021-10-03: qty 2, 28d supply, fill #3
  Filled 2021-10-25: qty 2, 28d supply, fill #4
  Filled 2021-12-13: qty 2, 28d supply, fill #5
  Filled 2022-01-07 – 2022-01-16 (×2): qty 2, 28d supply, fill #6
  Filled 2022-02-07: qty 2, 28d supply, fill #7
  Filled 2022-03-14: qty 2, 28d supply, fill #8
  Filled 2022-04-04: qty 2, 28d supply, fill #9
  Filled 2022-06-07: qty 2, 28d supply, fill #10
  Filled 2022-06-24: qty 2, 28d supply, fill #11

## 2021-07-09 NOTE — Telephone Encounter (Signed)
Pa submitted for repatha on cmm: Thomas Gardner (Key: [id number])  Lipid/hepatic panel ordered and released.  Called and unable to lmom that repatha was approved rx sent and complete fasting labs post 4th dose.

## 2021-07-09 NOTE — Telephone Encounter (Signed)
-----   Message from Rockne Menghini, [place] sent at 07/09/2021  7:48 AM EST ----- Please do PA for Repatha 140 mg, with labs after 4-6 doses.  Thank you!

## 2021-07-09 NOTE — Assessment & Plan Note (Signed)
Patient with essential hypertension, still not at goal.  Will have him increase losartan to 50 mg daily.  Patient would prefer to take 12.5 mg four times daily (or 25 mg twice daily), will adjust based on his ongoing problems with dizziness.  He should continue with regular home monitoring.  He has a follow up with Dr. Oval Linsey in April.

## 2021-07-09 NOTE — Assessment & Plan Note (Signed)
Patient with familial hyperlipidemia, baseline LDL in the 200's.  LDL down to 112 on rosuvastatin 40, but not to goal of < 55 (history of CVA, DM2).  Reviewed options for lowering LDL cholesterol, including PCSK-9 inhibitors, bempedoic acid and inclisiran.  Discussed mechanisms of action, dosing, side effects and potential decreases in LDL cholesterol.  Also reviewed cost information and potential options for patient assistance.  Answered all patient questions.  Based on this information, patient would prefer to start PCSK-9 inhibitor.  We will start the process to get Repatha covered by insurance.  Once on medication he will take Repatha 140 mg q14d and repeat labs after 4-6 doses.

## 2021-07-11 ENCOUNTER — Other Ambulatory Visit (HOSPITAL_COMMUNITY): Payer: Self-pay

## 2021-07-11 ENCOUNTER — Other Ambulatory Visit: Payer: Self-pay | Admitting: Internal Medicine

## 2021-07-11 DIAGNOSIS — Z794 Long term (current) use of insulin: Secondary | ICD-10-CM

## 2021-07-12 ENCOUNTER — Other Ambulatory Visit (HOSPITAL_COMMUNITY): Payer: Self-pay

## 2021-07-12 MED ORDER — LANTUS SOLOSTAR 100 UNIT/ML ~~LOC~~ SOPN
20.0000 [IU] | PEN_INJECTOR | Freq: Two times a day (BID) | SUBCUTANEOUS | 3 refills | Status: DC
  Filled 2021-07-12: qty 12, 30d supply, fill #0
  Filled 2021-08-22: qty 12, 30d supply, fill #1
  Filled 2021-09-26: qty 12, 30d supply, fill #2
  Filled 2021-11-07: qty 12, 30d supply, fill #3

## 2021-07-13 ENCOUNTER — Encounter: Payer: Self-pay | Admitting: Pharmacist Clinician (PhC)/ Clinical Pharmacy Specialist

## 2021-07-13 ENCOUNTER — Other Ambulatory Visit (HOSPITAL_COMMUNITY): Payer: Self-pay

## 2021-07-18 ENCOUNTER — Other Ambulatory Visit: Payer: Self-pay | Admitting: Urology

## 2021-07-18 DIAGNOSIS — R972 Elevated prostate specific antigen [PSA]: Secondary | ICD-10-CM

## 2021-07-30 ENCOUNTER — Other Ambulatory Visit (HOSPITAL_COMMUNITY): Payer: Self-pay

## 2021-07-30 MED ORDER — LOSARTAN POTASSIUM 25 MG PO TABS
ORAL_TABLET | ORAL | 3 refills | Status: DC
Start: 2021-07-30 — End: 2022-09-11
  Filled 2021-07-30 – 2021-10-12 (×2): qty 135, 90d supply, fill #0
  Filled 2022-02-07: qty 135, 90d supply, fill #1
  Filled 2022-06-14: qty 135, 90d supply, fill #2

## 2021-07-30 MED ORDER — ROSUVASTATIN CALCIUM 20 MG PO TABS
20.0000 mg | ORAL_TABLET | Freq: Every day | ORAL | 3 refills | Status: DC
  Filled 2021-07-30: qty 90, 90d supply, fill #0

## 2021-08-02 ENCOUNTER — Other Ambulatory Visit (HOSPITAL_COMMUNITY): Payer: Self-pay

## 2021-08-06 ENCOUNTER — Other Ambulatory Visit (HOSPITAL_COMMUNITY): Payer: Self-pay

## 2021-08-06 DIAGNOSIS — Z006 Encounter for examination for normal comparison and control in clinical research program: Secondary | ICD-10-CM

## 2021-08-07 NOTE — Research (Signed)
Called patient to discuss PREVAIL trial and was only able to to speak to him briefly but he is interested in participating. Sent an email with the copy of ICF for him to review. Will contact him at the end of the week ?

## 2021-08-10 ENCOUNTER — Other Ambulatory Visit (HOSPITAL_COMMUNITY): Payer: Self-pay

## 2021-08-14 ENCOUNTER — Other Ambulatory Visit: Payer: Self-pay | Admitting: Otolaryngology

## 2021-08-14 DIAGNOSIS — E041 Nontoxic single thyroid nodule: Secondary | ICD-10-CM

## 2021-08-16 ENCOUNTER — Other Ambulatory Visit (HOSPITAL_COMMUNITY): Payer: Self-pay

## 2021-08-16 ENCOUNTER — Other Ambulatory Visit: Payer: Medicare Other

## 2021-08-17 ENCOUNTER — Other Ambulatory Visit (HOSPITAL_COMMUNITY): Payer: Self-pay

## 2021-08-20 ENCOUNTER — Other Ambulatory Visit (HOSPITAL_COMMUNITY): Payer: Self-pay

## 2021-08-22 ENCOUNTER — Encounter: Payer: Self-pay | Admitting: Adult Health

## 2021-08-22 ENCOUNTER — Ambulatory Visit: Payer: Medicare Other | Admitting: Adult Health

## 2021-08-22 ENCOUNTER — Other Ambulatory Visit (HOSPITAL_COMMUNITY): Payer: Self-pay

## 2021-08-22 VITALS — BP 148/82 | HR 81 | Ht 66.0 in | Wt 236.0 lb

## 2021-08-22 DIAGNOSIS — I69398 Other sequelae of cerebral infarction: Secondary | ICD-10-CM | POA: Diagnosis not present

## 2021-08-22 DIAGNOSIS — R2689 Other abnormalities of gait and mobility: Secondary | ICD-10-CM

## 2021-08-22 DIAGNOSIS — I639 Cerebral infarction, unspecified: Secondary | ICD-10-CM | POA: Diagnosis not present

## 2021-08-22 NOTE — Progress Notes (Signed)
?[name] ?[name] ?[name]. [name] [address] ?[phone] ? ?     STROKE FOLLOW UP NOTE ? ?[name] ?Date of Birth:  [birth year] ?Medical Record Number:  [id number]  ? ?Reason for Referral: stroke follow up ? ? ? ?SUBJECTIVE: ? ? ?CHIEF COMPLAINT:  ?Chief Complaint  ?Patient presents with  ? Follow-up  ?  Rm 2 alone ?Pt is well, states he has been having "hot flashes" in his legs and weakness. Overall stable   ? ? ?HPI:  ? ?Update 08/22/2021 JM: Patient returns for 62-monthstroke follow-up unaccompanied.  Overall stable without new stroke/TIA symptoms. Does have occasional imbalance but overall stable. Will occassionally use cane, denies any recent falls. Also mentions continued lower extremity symptoms - he has been trying to speak with PCP and cardiology regarding Jardiance concerns (see prior HPI) -symptoms have not changed or worsened.  Is closely being followed by cardiology with recent adjustments to BP medications due to complaints of dizziness. This has since improved since med adjustments. Blood pressure can still fluctuate but not like it was. Able to maintain ADLs and IADLs independently.  Compliant on aspirin without side effects.  Remains on Crestor, myalgias improved after decreased dose, and recently started on Repatha for continued elevated LDL, although improved, at 112 down from 271.  Plans on repeat lipid panel end of this month.  Blood pressure today 148/82. Diabetes levels fluctuate - will be checking an A1c soon.  No further concerns at this time. ? ? ? ?History provided for reference purposes only ?Initial visit 05/23/2021 JM: Patient being seen for initial hospital follow-up unaccompanied. Overall doing well from stroke standpoint. Working with NR PT but currently on hold due to low blood pressure issues. Blood pressure monitored at home and typically around 150s/90s but at times can be higher (per report) then during exertion (or with PT) he will become  symptomatically hypotensive.  He is currently working with cardiology and PCP regarding these concerns, scheduled to see cardiac pharmacist 1/19.  Occasional N/V after certain medications but improving. Lives alone. Able to maintain ADLs independently and gradually returning to prior IADLs.  Drives short distance without difficulty.  Denies new stroke/TIA symptoms.  Compliant on aspirin, Crestor and Zetia without side effects. Prior difficulty tolerating Crestor but currently denies.  Blood pressure today 139/78. Does report intermittent b/l toes and finger tip numbness as well as right leg (side, front/groin and buttock, and perineum numbness/odd sensation. He believes [place] symptoms on a similar medication - he plans on further discussing with cardiac pharm. Denies weakness, pain, or loss of bladder or bowel control.  No further concerns at this time ? ?Stroke admission 03/02/2021 ?CGIANCARLO ASKRENis a 66y.o. male with PMH significant for DM2, HLD, HTN, Morbid obesity, OSA who presented on 03/02/2021 with vertigo that started on the evening of 02/28/21. Also has posterior headache that started after wisdom tooth was removed. Initially came to the ED on 03/01/21 and MRI Was read as normal. It does show a small left meduallary stroke on DWI imaging. He came in today with intense persistent dizziness.  Present reviewed hospitalization pertinent progress notes, lab work and imaging.  Evaluated by Dr. 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  EF 50%.  LDL 271 -noncompliant on  Crestor and Zetia therefore resumed at discharge.  A1c 9.9.  Recommended DAPT for 3 weeks and aspirin alone.  Advised outpatient follow-up with cardiology for acute on chronic diastolic heart failure.  PT/OT eval recommended CIR for  ongoing therapy needs. ? ? ? ? ?PERTINENT IMAGING ? ?Per recent hospitalization ?Code Stroke CT head: ?No acute intracranial abnormalities. Mild chronic cerebral atrophy ?and small vessel ischemic changes. ?CTA head & neck  ?1. No emergent large vessel occlusion. ?2. Multifocal moderate-to-severe stenosis of the right posterior ?cerebral artery P2-P3 segments. ?3. Severe stenosis of the proximal left external carotid artery. ?4. 3.6 cm incidental right thyroid nodule.  ?MRI   ?Punctate focus of abnormal diffusion restriction in the left medullary pyramid. This may represent a small focus of acute ischemia. ?2D Echo  ?1. Left ventricular ejection fraction, by estimation, is 50%. The left  ?ventricle has low normal function. The left ventricle has no regional wall  ?motion abnormalities. There is mild left ventricular hypertrophy. Left  ?ventricular diastolic parameters are  ? indeterminate.  ? 2. Right ventricular systolic function is normal. The right ventricular  ?size is normal. Tricuspid regurgitation signal is inadequate for assessing  ?PA pressure.  ? 3. The mitral valve is normal in structure. Trivial mitral valve  ?regurgitation. No evidence of mitral stenosis.  ? 4. The aortic valve is grossly normal. Aortic valve regurgitation is not  ?visualized. No aortic stenosis is present.  ? 5. The inferior vena cava is normal in size with greater than 50%  ?respiratory variability, suggesting right atrial pressure of 3 mmHg.  ?No atrial level shunt detected by color flow Doppler. ?LDL 271 ?HgbA1c 9.9 ? ? ? ?ROS:   ?14 system review of systems performed and negative with exception of those listed in HPI ? ?PMH:  ?Past Medical History:  ?Diagnosis Date  ? 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 Arthritis   ? Asthma   ? CHF (congestive heart failure) ([place])   ? Diabetes mellitus 2003  ? Familial hyperlipidemia 05/07/2021  ? GERD (gastroesophageal reflux disease)   ? Hyperlipidemia   ? Hypertension   ?  Morbid obesity ([place])   ? Sleep apnea   ? ? ?PSH:  ?Past Surgical History:  ?Procedure Laterality Date  ? BREATH TEK H PYLORI  11/11/2011  ? Procedure: BREATH TEK H PYLORI;  Surgeon: Shann Medal, MD;  Location: [facility];  Service: General;  Laterality: N/A;  ? CARDIAC CATHETERIZATION    ? ESOPHAGOGASTRODUODENOSCOPY (EGD) WITH PROPOFOL N/A 05/04/2014  ? Procedure: ESOPHAGOGASTRODUODENOSCOPY (EGD) WITH PROPOFOL;  Surgeon: Cleotis Nipper, MD;  Location: [facility] ENDOSCOPY;  Service: Endoscopy;  Laterality: N/A;  ? ? ?Social History:  ?Social History  ? ?Socioeconomic History  ? Marital status: Single  ?  Spouse name: Not on file  ? Number of children: Not on file  ? Years of education: Not on file  ? Highest education level: Not on file  ?Occupational History  ? Occupation: Beautician  ?Tobacco Use  ? Smoking status: Never  ? Smokeless tobacco: Never  ?Vaping Use  ? Vaping Use: Never used  ?Substance and Sexual Activity  ? Alcohol use: No  ?  Alcohol/week: 0.0 standard drinks  ? Drug use: No  ? Sexual activity: Not on file  ?Other Topics Concern  ? Not on file  ?Social History Narrative  ? Not on file  ? ?Social Determinants of Health  ? ?Financial Resource Strain: Not on file  ?Food Insecurity: Not on  file  ?Transportation Needs: Not on file  ?Physical Activity: Not on file  ?Stress: Not on file  ?Social Connections: Not on file  ?Intimate Partner Violence: Not on file  ? ? ?Family History:  ?Family History  ?Problem Relation Age of Onset  ? Heart failure Mother   ? Cancer Mother   ?     ovarian  ? Diabetes Mother   ? Stroke Father   ? Heart failure Sister   ? Heart failure Brother   ? Colon cancer Brother   ? Cancer Maternal Aunt   ?     breast  ? Diabetes Maternal Aunt   ? Cancer Maternal Uncle   ?     colon  ? ? ?Medications:   ?Current Outpatient Medications on File Prior to Visit  ?Medication Sig Dispense Refill  ? acetaminophen (TYLENOL) 325 MG tablet Take 1-2 tablets (325-650 mg total) by mouth every 4 (four)  hours as needed for mild pain.    ? albuterol (VENTOLIN HFA) 108 (90 Base) MCG/ACT inhaler Inhale 2 puffs into the lungs every 6 (six) hours as needed for wheezing or shortness of breath. MAP pharmacy 25.5 g 1

## 2021-08-22 NOTE — Patient Instructions (Addendum)
Consider restarting therapies once blood pressure stabilized - if you need assistance with this, please let me know!  ? ?Continue aspirin 81 mg daily  and Crestor and Repatha  for secondary stroke prevention ? ?Continue to follow up with PCP/cardiology regarding cholesterol, diabetes and blood pressure management  ?Maintain strict control of hypertension with blood pressure goal below 130/90, diabetes with hemoglobin A1c goal below 7.0 % and cholesterol with LDL cholesterol (bad cholesterol) goal below 70 mg/dL.  ? ?Signs of a Stroke? Follow the BEFAST method:  ?Balance Watch for a sudden loss of balance, trouble with coordination or vertigo ?Eyes Is there a sudden loss of vision in one or both eyes? Or double vision?  ?Face: Ask the person to smile. Does one side of the face droop or is it numb?  ?Arms: Ask the person to raise both arms. Does one arm drift downward? Is there weakness or numbness of a leg? ?Speech: Ask the person to repeat a simple phrase. Does the speech sound slurred/strange? Is the person confused ? ?Time: If you observe any of these signs, call 911. ? ? ? ? ? ? ? ?Thank you for coming to see [place] at [facility]. I hope we have been able to provide you high quality care today. ? ?You may receive a patient satisfaction survey over the next few weeks. We would appreciate your feedback and comments so that we may continue to improve ourselves and the health of our patients. ? ?

## 2021-09-03 ENCOUNTER — Other Ambulatory Visit (HOSPITAL_COMMUNITY): Payer: Self-pay

## 2021-09-03 ENCOUNTER — Ambulatory Visit (INDEPENDENT_AMBULATORY_CARE_PROVIDER_SITE_OTHER): Payer: Medicare Other | Admitting: Cardiovascular Disease

## 2021-09-03 ENCOUNTER — Encounter (HOSPITAL_BASED_OUTPATIENT_CLINIC_OR_DEPARTMENT_OTHER): Payer: Self-pay | Admitting: Cardiovascular Disease

## 2021-09-03 VITALS — BP 112/62 | HR 86 | Ht 66.0 in | Wt 236.6 lb

## 2021-09-03 DIAGNOSIS — E041 Nontoxic single thyroid nodule: Secondary | ICD-10-CM

## 2021-09-03 DIAGNOSIS — E7849 Other hyperlipidemia: Secondary | ICD-10-CM | POA: Diagnosis not present

## 2021-09-03 DIAGNOSIS — I5022 Chronic systolic (congestive) heart failure: Secondary | ICD-10-CM | POA: Diagnosis not present

## 2021-09-03 DIAGNOSIS — Z79899 Other long term (current) drug therapy: Secondary | ICD-10-CM

## 2021-09-03 DIAGNOSIS — I63332 Cerebral infarction due to thrombosis of left posterior cerebral artery: Secondary | ICD-10-CM | POA: Diagnosis not present

## 2021-09-03 MED ORDER — FUROSEMIDE 40 MG PO TABS
40.0000 mg | ORAL_TABLET | Freq: Every day | ORAL | 3 refills | Status: DC | PRN
  Filled 2021-09-03: qty 30, 30d supply, fill #0

## 2021-09-03 MED ORDER — ROSUVASTATIN CALCIUM 10 MG PO TABS
10.0000 mg | ORAL_TABLET | ORAL | 3 refills | Status: DC
  Filled 2021-09-03: qty 30, 70d supply, fill #0

## 2021-09-03 NOTE — Assessment & Plan Note (Signed)
He still feels unsteady on his feet.  He previously had to stop physical therapy due to labile blood pressures.  Now that his blood pressure is better controlled he is going to start back.  Continue aspirin and lipid management. ?

## 2021-09-03 NOTE — Assessment & Plan Note (Addendum)
LVEF 50%.  He has mild volume overload on exam.  We will give him Lasix 40 mg to take for couple days.  Blood pressure is well controlled on carvedilol, chlorthalidone, and losartan.  Blood pressures been too low and labile to switch to [facility] for now.  Continue Jardiance. ?

## 2021-09-03 NOTE — Progress Notes (Signed)
?Cardiology Office Note:   ? ?Date:  09/03/2021  ? ?ID:  Thomas Gardner, DOB 05-Mar-1956, MRN [id number] ? ?PCP:  Roselee Nova, MD ?  ?[facility] Providers ?Cardiologist:  None    ? ?Referring MD: Scarlett Presto, MD  ? ?No chief complaint on file. ? ? ?History of Present Illness:   ? ?Thomas Gardner is a 66 y.o. male with a hx of hypertension, hyperlipidemia, chronic diastolic heart failure, diabetes, morbid obesity, asthma, GERD, arthritis, and sleep apnea,  who presents for follow-up. He was initially seen 05/07/2021 for the evaluation of hyperlipidemia and chronic heart failure with reduced ejection fraction. He was admitted 02/2021 with an acute ischemic stroke of the left thalamic pyramid. He had severe stenosis of the proximal left external carotid artery. He also had multifocal moderate to severe stenoses of the right posterior cerebral artery. He was treated with aspirin and plavix for 3 months, and then aspirin alone. Echo on admission revealed LVEF 50% and indeterminate diastolic dysfunction. His hospitalization was also complicated by acute on chronic diastolic heart failure requiring diuresis. Outpatient follow up with cardiology was recommended. He was noted to have a thyroid nodule, and outpatient FNA was recommended. He previously had an Echo 11/2020 with LVEF 35-40% and grade 1 diastolic dysfunction. Nuclear and stress tests in 2013 revealed LVEF 37% and no ischemia. ? ?At his last appointment, Thomas Gardner was suffering from nausea and emesis after taking his morning medications. He also felt lightheaded with blurry vision at those times. His at home blood pressures were usually high such as 179, but could drop to the 140s after calming himself. It was planned to switch losartan to [facility] if his BP allowed. He was advised to try taking his meds after eating to increase tolerability, as well as trying rosuvastatin at night. He followed up with our pharmacist 07/05/2021 and his BP was not at goal.  Due to continued issues with dizziness he had divided his losartan to 12.5 mg twice daily. Losartan was increased to 50 mg daily, but he preferred to take 12.5 mg 4x daily or 25 mg twice daily. ? ?Today, he is feeling okay overall. Since his last appointment he has had a lot of days he would feel sick, lightheaded, or emetic. However, this seems to be improving as he has only been vomiting about 1 out of every 10 days. He has been making progress, but may feel very fatigued due to his symptoms. He makes sure to eat with his morning medications. Also he is splitting his losartan in half, with two halves in the morning and the third half at night. He notes doing well taking his carvedilol 4x daily, splitting his tablets in half (4 doses of 12.5 mg). He also complains of occasional abdominal bloating and cramping. Lately his blood pressure has been running higher due to multiple clinic visits and testing, including a recent thyroid biopsy. Frequently he is able to hear/feel his pulse in his ears. He endorses issues with sinus congestion and seasonal allergies. At times his legs feel "weird like he is walking like a child, learning to walk". He is hoping to be approved for further PT. Occasionally he goes on walks for exercise, but he does not yet feel steady enough on his feet to use the treadmill at the gym. He also reports intermittent LE edema with pain from touching his legs.  Of note, he never restarted Crestor after it was discontinued due to myalgias. He denies any  palpitations, chest pain, or shortness of breath. No headaches, syncope, orthopnea, or PND. ? ? ?Past Medical History:  ?Diagnosis Date  ? 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 Arthritis   ? Asthma   ? CHF (congestive heart failure) ([place])   ? Diabetes mellitus 2003  ? Familial hyperlipidemia 05/07/2021  ? GERD (gastroesophageal reflux disease)   ? Hyperlipidemia   ? Hypertension   ? Morbid obesity ([place])   ? Sleep apnea   ? ? ?Past  Surgical History:  ?Procedure Laterality Date  ? BREATH TEK H PYLORI  11/11/2011  ? Procedure: BREATH TEK H PYLORI;  Surgeon: Shann Medal, MD;  Location: [facility];  Service: General;  Laterality: N/A;  ? CARDIAC CATHETERIZATION    ? ESOPHAGOGASTRODUODENOSCOPY (EGD) WITH PROPOFOL N/A 05/04/2014  ? Procedure: ESOPHAGOGASTRODUODENOSCOPY (EGD) WITH PROPOFOL;  Surgeon: Cleotis Nipper, MD;  Location: [facility] ENDOSCOPY;  Service: Endoscopy;  Laterality: N/A;  ? ? ?Current Medications: ?Current Meds  ?Medication Sig  ? acetaminophen (TYLENOL) 325 MG tablet Take 1-2 tablets (325-650 mg total) by mouth every 4 (four) hours as needed for mild pain.  ? albuterol (VENTOLIN HFA) 108 (90 Base) MCG/ACT inhaler Inhale 2 puffs into the lungs every 6 (six) hours as needed for wheezing or shortness of breath. [facility] pharmacy  ? aspirin 81 MG EC tablet Take 1 tablet (81 mg total) by mouth daily. Swallow whole.  ? carvedilol (COREG) 25 MG tablet Take 1 tablet (25 mg total) by mouth 2 (two) times daily with a meal.  ? chlorthalidone (HYGROTON) 25 MG tablet Take 1 tablet (25 mg total) by mouth daily.  ? empagliflozin (JARDIANCE) 25 MG TABS tablet Take 1 tablet (25 mg total) by mouth daily.  ? Evolocumab (REPATHA SURECLICK) 638 MG/ML SOAJ Inject 140 mg into the skin every 14 (fourteen) days.  ? furosemide (LASIX) 40 MG tablet Take 1 tablet (40 mg total) by mouth daily as needed.  ? insulin glargine (LANTUS SOLOSTAR) 100 UNIT/ML Solostar Pen Inject 16 Units into the skin 2 (two) times daily.  ? liraglutide (VICTOZA) 18 MG/3ML SOPN Inject 1.8 mg into the skin daily.  ? losartan (COZAAR) 25 MG tablet Take 1 tablet (25 mg total) by mouth every morning AND 0.5 tablets (12.5 mg total) at bedtime.  ? omeprazole (PRILOSEC) 40 MG capsule Take 1 capsule (40 mg total) by mouth daily. (Patient taking differently: Take 40 mg by mouth as needed.)  ? vitamin C (ASCORBIC ACID) 500 MG tablet Take 1 tablet (500 mg total) by mouth daily.  ? Vitamin D3  (VITAMIN D) 25 MCG tablet Take 1 tablet (1,000 Units total) by mouth daily.  ?  ? ?Allergies:   Erythromycin  ? ?Social History  ? ?Socioeconomic History  ? Marital status: Single  ?  Spouse name: Not on file  ? Number of children: Not on file  ? Years of education: Not on file  ? Highest education level: Not on file  ?Occupational History  ? Occupation: Beautician  ?Tobacco Use  ? Smoking status: Never  ? Smokeless tobacco: Never  ?Vaping Use  ? Vaping Use: Never used  ?Substance and Sexual Activity  ? Alcohol use: No  ?  Alcohol/week: 0.0 standard drinks  ? Drug use: No  ? Sexual activity: Not on file  ?Other Topics Concern  ? Not on file  ?Social History Narrative  ? Not on file  ? ?Social Determinants of Health  ? ?Financial Resource Strain: Not on file  ?  Food Insecurity: Not on file  ?Transportation Needs: Not on file  ?Physical Activity: Not on file  ?Stress: Not on file  ?Social Connections: Not on file  ?  ? ?Family History: ?The patient's family history includes Cancer in his maternal aunt, maternal uncle, and mother; Colon cancer in his brother; Diabetes in his maternal aunt and mother; Heart failure in his brother, mother, and sister; Stroke in his father. ? ?ROS:   ?Please see the history of present illness.    ?(+) Lightheadedness ?(+) Nausea ?(+) Emesis ?(+) Abdominal bloating and cramping ?(+) Gait instability ?(+) Bilateral LE edema, painful ?(+) Sinus congestion ?(+) Seasonal allergies ?All other systems reviewed and are negative. ? ?EKGs/Labs/Other Studies Reviewed:   ? ?The following studies were reviewed today: ? ?Echo 03/04/2021: ? 1. Left ventricular ejection fraction, by estimation, is 50%. The left  ?ventricle has low normal function. The left ventricle has no regional wall  ?motion abnormalities. There is mild left ventricular hypertrophy. Left  ?ventricular diastolic parameters are  ? indeterminate.  ? 2. Right ventricular systolic function is normal. The right ventricular  ?size is normal.  Tricuspid regurgitation signal is inadequate for assessing  ?PA pressure.  ? 3. The mitral valve is normal in structure. Trivial mitral valve  ?regurgitation. No evidence of mitral stenosis.  ? 4. The aortic v

## 2021-09-03 NOTE — Assessment & Plan Note (Signed)
He is undergoing work-up for thyroid nodule.  He queries whether it could be related to his Victoza.  Recommended that he talk with his PCP and see if he needs to consider an alternative agent for diabetes management. ?

## 2021-09-03 NOTE — Patient Instructions (Addendum)
Medication Instructions:  ?RESTART: Rosuvastatin to 10 mg on Monday, Wednesday, and Friday ?Take: Lasix 40 mg daily for 3 days, then as needed ? ?*If you need a refill on your cardiac medications before your next appointment, please call your pharmacy* ? ? ?Lab Work: ?Your provider has recommended lab work today ( CMP, Amylase, Lipase). Please have this collected at [facility] [place]. The lab is open 8:00 am - 4:30 pm. Please avoid 12:00p - 1:00p for lunch hour. You do not need an appointment. Please go to [address] [place], [place] [address]. This is in the Primary Care office on the 3rd floor, let them know you are there for blood work and they will direct you to the lab. ? ?If you have labs (blood work) drawn today and your tests are completely normal, you will receive your results only by: ?MyChart Message (if you have MyChart) OR ?A paper copy in the mail ?If you have any lab test that is abnormal or we need to change your treatment, we will call you to review the results. ? ? ?Testing/Procedures: ?None ordered tdaoy ? ? ?Follow-Up: ?At [facility], you and your health needs are our priority.  As part of our continuing mission to provide you with exceptional heart care, we have created designated Provider Care Teams.  These Care Teams include your primary Cardiologist (physician) and Advanced Practice Providers (APPs -  Physician Assistants and Nurse Practitioners) who all work together to provide you with the care you need, when you need it. ? ?We recommend signing up for the patient portal called "MyChart".  Sign up information is provided on this After Visit Summary.  MyChart is used to connect with patients for Virtual Visits (Telemedicine).  Patients are able to view lab/test results, encounter notes, upcoming appointments, etc.  Non-urgent messages can be sent to your provider as well.   ?To learn more about what you can do with MyChart, go to  [email or web address].   ? ?Your next appointment:   ?6 month(s) ? ?The format for your next appointment:   ?In Person ? ?Provider:   ?Skeet Latch, MD{ ? ? ?Important Information About Sugar ? ? ? ? ? ? ?

## 2021-09-03 NOTE — Assessment & Plan Note (Signed)
Lipids have improved with rosuvastatin.  He is struggled with myalgias.  He is now on Repatha.  He had not restarted the rosuvastatin.  He is going to try taking it 10 mg on Monday, Wednesday, and Fridays. ?

## 2021-09-04 LAB — COMPREHENSIVE METABOLIC PANEL
ALT: 8 IU/L (ref 0–44)
AST: 9 IU/L (ref 0–40)
Albumin/Globulin Ratio: 1.6 (ref 1.2–2.2)
Albumin: 4.1 g/dL (ref 3.8–4.8)
Alkaline Phosphatase: 91 IU/L (ref 44–121)
BUN/Creatinine Ratio: 22 (ref 10–24)
BUN: 25 mg/dL (ref 8–27)
Bilirubin Total: 0.3 mg/dL (ref 0.0–1.2)
CO2: 23 mmol/L (ref 20–29)
Calcium: 9.1 mg/dL (ref 8.6–10.2)
Chloride: 99 mmol/L (ref 96–106)
Creatinine, Ser: 1.12 mg/dL (ref 0.76–1.27)
Globulin, Total: 2.5 g/dL (ref 1.5–4.5)
Glucose: 377 mg/dL — ABNORMAL HIGH (ref 70–99)
Potassium: 3.7 mmol/L (ref 3.5–5.2)
Sodium: 141 mmol/L (ref 134–144)
Total Protein: 6.6 g/dL (ref 6.0–8.5)
eGFR: 72 mL/min/{1.73_m2} (ref >59)

## 2021-09-04 LAB — AMYLASE: Amylase: 56 U/L (ref 31–110)

## 2021-09-04 LAB — LIPASE: Lipase: 33 U/L (ref 13–78)

## 2021-09-10 ENCOUNTER — Other Ambulatory Visit (HOSPITAL_COMMUNITY): Payer: Self-pay

## 2021-09-10 MED ORDER — LEVOFLOXACIN 750 MG PO TABS
750.0000 mg | ORAL_TABLET | ORAL | 0 refills | Status: DC
  Filled 2021-09-10 – 2021-09-26 (×2): qty 1, 1d supply, fill #0

## 2021-09-11 ENCOUNTER — Other Ambulatory Visit (HOSPITAL_COMMUNITY): Payer: Self-pay

## 2021-09-11 MED ORDER — DIAZEPAM 10 MG PO TABS
ORAL_TABLET | ORAL | 0 refills | Status: DC
  Filled 2021-09-11: qty 2, 1d supply, fill #0

## 2021-09-12 ENCOUNTER — Ambulatory Visit
Admission: RE | Admit: 2021-09-12 | Discharge: 2021-09-12 | Disposition: A | Discharge status: Home / Self Care | Payer: Medicare Other | Source: Ambulatory Visit | Attending: Urology | Admitting: Urology

## 2021-09-12 ENCOUNTER — Telehealth (HOSPITAL_BASED_OUTPATIENT_CLINIC_OR_DEPARTMENT_OTHER): Payer: Self-pay | Admitting: *Deleted

## 2021-09-12 DIAGNOSIS — R972 Elevated prostate specific antigen [PSA]: Secondary | ICD-10-CM

## 2021-09-12 MED ORDER — GADOBENATE DIMEGLUMINE 529 MG/ML IV SOLN
20.0000 mL | Freq: Once | INTRAVENOUS | Status: AC | PRN
  Administered 2021-09-12: 20 mL via INTRAVENOUS

## 2021-09-12 NOTE — Telephone Encounter (Signed)
? ?  Pre-operative Risk Assessment  ?  ?Patient Name: Thomas Gardner  ?DOB: 1956/03/15 ?MRN: [id number]{ ? ?  ? ?Request for Surgical Clearance   ? ?Procedure:   Prostate Biopsy ? ?Date of Surgery:  Clearance 09/27/21                              ?   ?Surgeon:  Dr Harrell Gave Lovena Neighbours ?Surgeon's Group or Practice Name:  [facility] ?Phone number:  [phone] ?Fax number:  [phone] ?  ?Type of Clearance Requested:   ?- Medical  ?  ?Type of Anesthesia:   Unknown ?  ?Additional requests/questions:   Hold Aspirin 81 mg for 5 days ? ?Signed, ?Barbaraann Barthel   ?09/12/2021, 2:37 PM  ? ?

## 2021-09-12 NOTE — Telephone Encounter (Signed)
Dr. Oval Linsey, Please provide your recommendations regarding clearance. You have seen patient about 2 weeks ago.  ? ?Please forward your response to P CV DIV PREOP.  ? ?Thank you  ?

## 2021-09-14 ENCOUNTER — Other Ambulatory Visit (HOSPITAL_COMMUNITY): Payer: Self-pay

## 2021-09-17 ENCOUNTER — Ambulatory Visit
Admission: RE | Admit: 2021-09-17 | Discharge: 2021-09-17 | Disposition: A | Discharge status: Home / Self Care | Payer: Medicare Other | Source: Ambulatory Visit | Attending: Otolaryngology | Admitting: Otolaryngology

## 2021-09-17 DIAGNOSIS — E041 Nontoxic single thyroid nodule: Secondary | ICD-10-CM

## 2021-09-17 MED ORDER — IOPAMIDOL (ISOVUE-300) INJECTION 61%
75.0000 mL | Freq: Once | INTRAVENOUS | Status: AC | PRN
  Administered 2021-09-17: 75 mL via INTRAVENOUS

## 2021-09-17 NOTE — Telephone Encounter (Signed)
? ?  Patient Name: Thomas Gardner  ?DOB: 02/01/1956 ?MRN: [id number] ? ?Primary Cardiologist: Dr. Oval Linsey ? ?Chart reviewed as part of pre-operative protocol coverage.  ? ?Dr. Oval Linsey reviewed clearance request as she had recently evaluated patient. Per Dr. Oval Linsey, fine to proceed from cardiac standpoint but she does recommend that procedure/ASA hold needs to be cleared by Neurology given his recent stroke.  ? ?Will route this bundled recommendation to requesting provider via Epic fax function. Please call with questions. ? ?[name], PA-C ?09/17/2021, 8:01 AM ? ? ?

## 2021-09-19 ENCOUNTER — Telehealth: Payer: Self-pay

## 2021-09-19 NOTE — Telephone Encounter (Signed)
Surgical Clearance has been received from [facility]. ? ?Urology is needing clearance for prostate biopsy and if ok to stop aspirin 5 days prior. Pt has received cardiology clearance but now asking for neurology clearance.  ?

## 2021-09-19 NOTE — Telephone Encounter (Signed)
[name] from [facility] was calling to get clarification regarding this patient's surgical clearance. ?If patty is not available, she does have a secure voicemail at her desk  ?

## 2021-09-19 NOTE — Telephone Encounter (Signed)
Per Melina Copa, PAC pre op provider today, to disregard notes. She has spoke with [facility].  ?

## 2021-09-19 NOTE — Telephone Encounter (Addendum)
? ?  Patient Name: Thomas Gardner  ?DOB: 02-22-1956 ?MRN: [id number] ? ?Primary Cardiologist: Dr. Oval Linsey ? ?Chart reviewed as part of pre-operative protocol coverage. I was able to speak with [name] at [facility] to relay our preop recommendation. She did not receive the fax that was sent to the original requested number. ? ?To summarize recommendation, Dr. Oval Linsey reviewed clearance request as she had recently evaluated patient. Per Dr. Oval Linsey, fine to proceed from cardiac standpoint but she does recommend that procedure/ASA hold needs to be cleared by Neurology given his recent stroke. ?  ?Will route this bundled recommendation to requesting provider via Epic fax function to the fax number that [name] requested. Please call with questions. ? ? ?[name], PA-C ?09/19/2021, 12:05 PM ? ? ? ?

## 2021-09-20 NOTE — Telephone Encounter (Signed)
Second fax has been sent, confirmation received.  ?

## 2021-09-20 NOTE — Telephone Encounter (Signed)
Placed in out box. Thank you.  ?

## 2021-09-20 NOTE — Telephone Encounter (Signed)
Clearance letter has been completed and faxed back to [facility]. Fax # [phone], confirmation received.  ?

## 2021-09-20 NOTE — Telephone Encounter (Signed)
[facility] is asking if the Clearance letter can please be faxed again. ?

## 2021-09-21 ENCOUNTER — Other Ambulatory Visit (HOSPITAL_COMMUNITY): Payer: Self-pay

## 2021-09-26 ENCOUNTER — Other Ambulatory Visit (HOSPITAL_COMMUNITY): Payer: Self-pay

## 2021-09-27 ENCOUNTER — Other Ambulatory Visit (HOSPITAL_COMMUNITY): Payer: Self-pay

## 2021-09-28 ENCOUNTER — Other Ambulatory Visit (HOSPITAL_COMMUNITY): Payer: Self-pay

## 2021-10-01 ENCOUNTER — Other Ambulatory Visit (HOSPITAL_COMMUNITY): Payer: Self-pay

## 2021-10-01 MED ORDER — MOUNJARO 5 MG/0.5ML ~~LOC~~ SOAJ
5.0000 mg | SUBCUTANEOUS | 2 refills | Status: DC
  Filled 2021-11-12: qty 2, 28d supply, fill #0
  Filled 2022-01-07: qty 2, 28d supply, fill #1
  Filled 2022-02-07: qty 2, 28d supply, fill #2

## 2021-10-01 MED ORDER — MOUNJARO 2.5 MG/0.5ML ~~LOC~~ SOAJ
2.5000 mg | SUBCUTANEOUS | 0 refills | Status: DC
  Filled 2021-10-01: qty 2, 28d supply, fill #0

## 2021-10-03 ENCOUNTER — Other Ambulatory Visit (HOSPITAL_COMMUNITY): Payer: Self-pay

## 2021-10-09 ENCOUNTER — Telehealth: Payer: Self-pay | Admitting: Radiation Oncology

## 2021-10-09 NOTE — Telephone Encounter (Signed)
Called patient to schedule a consultation w. Dr. Tammi Klippel. No answer, unable to LVM, line just rings.

## 2021-10-12 ENCOUNTER — Other Ambulatory Visit: Payer: Self-pay | Admitting: Internal Medicine

## 2021-10-12 ENCOUNTER — Other Ambulatory Visit: Payer: Self-pay | Admitting: Family Medicine

## 2021-10-12 ENCOUNTER — Other Ambulatory Visit (HOSPITAL_COMMUNITY): Payer: Self-pay

## 2021-10-12 DIAGNOSIS — I10 Essential (primary) hypertension: Secondary | ICD-10-CM

## 2021-10-12 MED ORDER — CHLORTHALIDONE 25 MG PO TABS
25.0000 mg | ORAL_TABLET | Freq: Every day | ORAL | 3 refills | Status: DC
  Filled 2021-10-12: qty 90, 90d supply, fill #0
  Filled 2022-01-07: qty 90, 90d supply, fill #1
  Filled 2022-02-07 – 2022-04-15 (×2): qty 90, 90d supply, fill #2
  Filled 2022-06-24: qty 90, 90d supply, fill #3

## 2021-10-12 MED ORDER — JARDIANCE 25 MG PO TABS
25.0000 mg | ORAL_TABLET | Freq: Every morning | ORAL | 3 refills | Status: DC
  Filled 2021-10-12: qty 90, 90d supply, fill #0
  Filled 2022-01-07: qty 90, 90d supply, fill #1
  Filled 2022-02-07 – 2022-04-15 (×2): qty 90, 90d supply, fill #2
  Filled 2022-06-24: qty 90, 90d supply, fill #3

## 2021-10-12 NOTE — Telephone Encounter (Signed)
Please forward to PCP

## 2021-10-18 ENCOUNTER — Other Ambulatory Visit: Payer: Self-pay | Admitting: Otolaryngology

## 2021-10-18 ENCOUNTER — Other Ambulatory Visit (HOSPITAL_COMMUNITY): Payer: Self-pay

## 2021-10-18 MED ORDER — ROSUVASTATIN CALCIUM 40 MG PO TABS
40.0000 mg | ORAL_TABLET | Freq: Every day | ORAL | 3 refills | Status: DC
  Filled 2021-10-18: qty 90, 90d supply, fill #0

## 2021-10-22 ENCOUNTER — Encounter (HOSPITAL_COMMUNITY): Payer: Self-pay | Admitting: Otolaryngology

## 2021-10-22 ENCOUNTER — Other Ambulatory Visit (HOSPITAL_COMMUNITY): Payer: Self-pay

## 2021-10-22 NOTE — Progress Notes (Signed)
GU Location of Tumor / Histology: Prostate Ca  If Prostate Cancer, Gleason Score is (3 + 4) and PSA is (9.9 as of 06/2021)  Biopsies: Dr. Lovena Neighbours      Past/Anticipated interventions by urology, if any:     Past/Anticipated interventions by medical oncology, if any: NA  Weight changes, if any: {:18581}  IPSS: SHIM:  Bowel/Bladder complaints, if any: {:18581}   Nausea/Vomiting, if any: {:18581}  Pain issues, if any:  {:18581}  SAFETY ISSUES: Prior radiation? {:18581} Pacemaker/ICD? {:18581} Possible current pregnancy? Male Is the patient on methotrexate? No  Current Complaints / other details:

## 2021-10-23 ENCOUNTER — Other Ambulatory Visit (HOSPITAL_COMMUNITY): Payer: Self-pay

## 2021-10-24 ENCOUNTER — Ambulatory Visit
Admission: RE | Admit: 2021-10-24 | Discharge: 2021-10-24 | Disposition: A | Discharge status: Home / Self Care | Payer: Medicare Other | Source: Ambulatory Visit | Attending: Radiation Oncology | Admitting: Radiation Oncology

## 2021-10-24 ENCOUNTER — Other Ambulatory Visit: Payer: Self-pay

## 2021-10-24 DIAGNOSIS — C61 Malignant neoplasm of prostate: Secondary | ICD-10-CM | POA: Diagnosis present

## 2021-10-24 DIAGNOSIS — Z79899 Other long term (current) drug therapy: Secondary | ICD-10-CM | POA: Insufficient documentation

## 2021-10-24 DIAGNOSIS — E785 Hyperlipidemia, unspecified: Secondary | ICD-10-CM | POA: Diagnosis not present

## 2021-10-24 DIAGNOSIS — Z800 Family history of malignant neoplasm of digestive organs: Secondary | ICD-10-CM | POA: Insufficient documentation

## 2021-10-24 DIAGNOSIS — G4730 Sleep apnea, unspecified: Secondary | ICD-10-CM | POA: Insufficient documentation

## 2021-10-24 DIAGNOSIS — K219 Gastro-esophageal reflux disease without esophagitis: Secondary | ICD-10-CM | POA: Insufficient documentation

## 2021-10-24 DIAGNOSIS — J45909 Unspecified asthma, uncomplicated: Secondary | ICD-10-CM | POA: Diagnosis not present

## 2021-10-24 DIAGNOSIS — Z8673 Personal history of transient ischemic attack (TIA), and cerebral infarction without residual deficits: Secondary | ICD-10-CM | POA: Diagnosis not present

## 2021-10-24 DIAGNOSIS — Z8041 Family history of malignant neoplasm of ovary: Secondary | ICD-10-CM | POA: Diagnosis not present

## 2021-10-24 DIAGNOSIS — Z794 Long term (current) use of insulin: Secondary | ICD-10-CM | POA: Diagnosis not present

## 2021-10-24 DIAGNOSIS — I509 Heart failure, unspecified: Secondary | ICD-10-CM | POA: Diagnosis not present

## 2021-10-24 DIAGNOSIS — I10 Essential (primary) hypertension: Secondary | ICD-10-CM | POA: Diagnosis not present

## 2021-10-24 DIAGNOSIS — Z7982 Long term (current) use of aspirin: Secondary | ICD-10-CM | POA: Diagnosis not present

## 2021-10-24 DIAGNOSIS — Z803 Family history of malignant neoplasm of breast: Secondary | ICD-10-CM | POA: Diagnosis not present

## 2021-10-24 DIAGNOSIS — E119 Type 2 diabetes mellitus without complications: Secondary | ICD-10-CM | POA: Diagnosis not present

## 2021-10-24 NOTE — Progress Notes (Signed)
Introduced myself to the patient as the prostate nurse navigator.  No barriers to care identified at this time.  He is here to discuss his radiation treatment options. He also has a surgical consult with Dr. Louis Meckel on 7/13 and was agreeable for me to follow up after this visit to discuss treatment decision.  I gave him my business card and asked him to call me with questions or concerns.  Verbalized understanding.

## 2021-10-24 NOTE — Progress Notes (Signed)
Radiation Oncology         [phone] ________________________________  Initial Outpatient Consultation  Name: Thomas Gardner MRN: [id number]  Date: 10/24/2021  DOB: [birth year]  ME:QAST, Talbert Forest, MD  Davis Gourd*   REFERRING PHYSICIAN: Davis Gourd*  DIAGNOSIS: 66 y.o. gentleman with Stage T1c adenocarcinoma of the prostate with Gleason score of 3+4, and PSA of 9.88.    ICD-10-CM   1. Malignant neoplasm of prostate ([place])  C61       HISTORY OF PRESENT ILLNESS: Thomas Gardner is a 66 y.o. male with a diagnosis of prostate cancer. He has a history of BPH and elevated PSA dating back to at least 2014 when he had a prior negative prostate biopsy with Dr. Jeffie Pollock for a PSA at 5.8 at that time.  The PSA has continued to be monitored by his PCP since that time and more recently, was noted to have a significantly elevated PSA of 10.58 on routine labs in December 2022, by his primary care physician, Dr. Manuella Ghazi.  Accordingly, he was referred back to [facility] urology for evaluation with Dr. Lovena Neighbours on 07/13/2021,  digital rectal examination was performed at that time revealing no nodularity or concerning findings.  The PSA was repeated that same day and remained elevated at 9.88.  He had an MRI prostate on 09/12/2021 which did show a 1.4 cm PI-RADS 4 lesion in the left anterior peripheral zone of the mid gland.  Therefore, the patient proceeded to MRI fusion transrectal ultrasound with 16 biopsies of the prostate on 09/27/2021.  The prostate volume measured 43 cc.  Out of 16 core biopsies, 4 were positive.  The maximum Gleason score was 3+4, and this was seen in 2 of 4 samples from the MRI ROI as well as in the left mid lateral and right apex lateral.  The patient reviewed the biopsy results with his urologist and he has kindly been referred today for discussion of potential radiation treatment options.   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   GERD (gastroesophageal reflux disease)    Hyperlipidemia    Hypertension    Medical history non-contributory    Morbid obesity ([place])    Sleep apnea       PAST SURGICAL HISTORY: Past Surgical History:  Procedure Laterality Date   BREATH TEK H PYLORI  11/11/2011   Procedure: BREATH TEK H PYLORI;  Surgeon: Shann Medal, MD;  Location: [facility];  Service: General;  Laterality: N/A;   CARDIAC CATHETERIZATION     ESOPHAGOGASTRODUODENOSCOPY (EGD) WITH PROPOFOL N/A 05/04/2014   Procedure: ESOPHAGOGASTRODUODENOSCOPY (EGD) WITH PROPOFOL;  Surgeon: Cleotis Nipper, MD;  Location: [facility];  Service: Endoscopy;  Laterality: N/A;    FAMILY HISTORY:  Family History  Problem Relation Age of Onset   Heart failure Mother    Cancer Mother        ovarian   Diabetes Mother    Stroke Father    Heart failure Sister    Heart failure Brother    Colon cancer Brother    Cancer Maternal Aunt        breast   Diabetes Maternal Aunt    Cancer Maternal Uncle  colon    SOCIAL HISTORY:  Social History   Socioeconomic History   Marital status: Single    Spouse name: Not on file   Number of children: Not on file   Years of education: Not on file   Highest education level: Not on file  Occupational History   Occupation: Beautician  Tobacco Use   Smoking status: Never   Smokeless tobacco: Never  Vaping Use   Vaping Use: Never used  Substance and Sexual Activity   Alcohol use: No    Alcohol/week: 0.0 standard drinks   Drug use: No   Sexual activity: Not on file  Other Topics Concern   Not on file  Social History Narrative   Not on file   Social Determinants of Health   Financial Resource Strain: Not on file  Food Insecurity: Not on file  Transportation Needs: Not  on file  Physical Activity: Not on file  Stress: Not on file  Social Connections: Not on file  Intimate Partner Violence: Not on file    ALLERGIES: Erythromycin  MEDICATIONS:  Current Outpatient Medications  Medication Sig Dispense Refill   acetaminophen (TYLENOL) 325 MG tablet Take 1-2 tablets (325-650 mg total) by mouth every 4 (four) hours as needed for mild pain.     albuterol (VENTOLIN HFA) 108 (90 Base) MCG/ACT inhaler Inhale 2 puffs into the lungs every 6 (six) hours as needed for wheezing or shortness of breath. MAP pharmacy 25.5 g 1   aspirin 81 MG EC tablet Take 1 tablet (81 mg total) by mouth daily. Swallow whole. 90 tablet 3   carvedilol (COREG) 25 MG tablet Take 1 tablet (25 mg total) by mouth 2 (two) times daily with a meal. 180 tablet 1   chlorthalidone (HYGROTON) 25 MG tablet Take 1 tablet (25 mg total) by mouth daily. 90 tablet 3   empagliflozin (JARDIANCE) 25 MG TABS tablet Take 1 tablet (25 mg total) by mouth in the morning. 90 tablet 3   Evolocumab (REPATHA SURECLICK) 630 MG/ML SOAJ Inject 140 mg into the skin every 14 (fourteen) days. 2 mL 11   furosemide (LASIX) 40 MG tablet Take 1 tablet (40 mg total) by mouth daily as needed. 30 tablet 3   insulin glargine (LANTUS SOLOSTAR) 100 UNIT/ML Solostar Pen Inject 20 Units into the skin 2 (two) times daily. (Patient taking differently: Inject 16 Units into the skin 2 (two) times daily.) 12 mL 3   levofloxacin (LEVAQUIN) 750 MG tablet Take 1 tablet (750 mg total) by mouth as directed 1 hour prior to your scheduled procedure (Patient not taking: Reported on 10/18/2021) 1 tablet 0   losartan (COZAAR) 25 MG tablet Take 1 tablet (25 mg total) by mouth every morning AND 0.5 tablets (12.5 mg total) at bedtime. 135 tablet 3   omeprazole (PRILOSEC) 40 MG capsule Take 1 capsule (40 mg total) by mouth daily. (Patient taking differently: Take 40 mg by mouth daily as needed (acid reflux).) 90 capsule 0   OVER THE COUNTER MEDICATION Take 2  [place] by mouth daily. [place]     rosuvastatin (CRESTOR) 10 MG tablet Take 1 tablet (10 mg total) by mouth every Monday, Wednesday, and Friday. 30 tablet 3   rosuvastatin (CRESTOR) 40 MG tablet Take 1 tablet (40 mg total) by mouth at bedtime. 90 tablet 3   tirzepatide (MOUNJARO) 2.5 MG/0.5ML Pen Inject 2.5 mg into the skin once a week for 4 weeks 2 mL 0   [START ON 11/01/2021] tirzepatide [facility]) 5  MG/0.5ML Pen Inject 5 mg into the skin once a week. 2 mL 2   vitamin C (ASCORBIC ACID) 500 MG tablet Take 1 tablet (500 mg total) by mouth daily. 30 tablet 0   Vitamin D3 (VITAMIN D) 25 MCG tablet Take 1 tablet (1,000 Units total) by mouth daily. 30 tablet 0   No current facility-administered medications for this visit.    REVIEW OF SYSTEMS:  On review of systems, the patient reports that he is doing well overall. He denies any chest pain, shortness of breath, cough, fevers, chills, night sweats, unintended weight changes. He denies any bowel disturbances, and denies abdominal pain, nausea or vomiting. He denies any new musculoskeletal or joint aches or pains. His IPSS was 20, indicating severe urinary symptoms With a weak flow of stream, intermittency, frequency, feelings of incomplete bladder emptying and nocturia x4. His SHIM was 8, indicating he has severe erectile dysfunction. A complete review of systems is obtained and is otherwise negative.    PHYSICAL EXAM:  Wt Readings from Last 3 Encounters:  09/03/21 236 lb 9.6 oz (107.3 kg)  08/22/21 236 lb (107 kg)  07/05/21 237 lb 11.2 oz (107.8 kg)   Temp Readings from Last 3 Encounters:  04/19/21 98.2 F (36.8 C) (Oral)  03/22/21 98.2 F (36.8 C) (Oral)  03/15/21 98.9 F (37.2 C) (Oral)   BP Readings from Last 3 Encounters:  09/03/21 112/62  08/22/21 (!) 148/82  07/05/21 140/80   Pulse Readings from Last 3 Encounters:  09/03/21 86  08/22/21 81  07/05/21 91    /10  In general this is a well appearing African-American male in no  acute distress. He's alert and oriented x4 and appropriate throughout the examination. Cardiopulmonary assessment is negative for acute distress, and he exhibits normal effort.     KPS = 100  100 - Normal; no complaints; no evidence of disease. 90   - Able to carry on normal activity; minor signs or symptoms of disease. 80   - Normal activity with effort; some signs or symptoms of disease. 90   - Cares for self; unable to carry on normal activity or to do active work. 60   - Requires occasional assistance, but is able to care for most of his personal needs. 50   - Requires considerable assistance and frequent medical care. 53   - Disabled; requires special care and assistance. 53   - Severely disabled; hospital admission is indicated although death not imminent. 38   - Very sick; hospital admission necessary; active supportive treatment necessary. 10   - Moribund; fatal processes progressing rapidly. 0     - Dead  Karnofsky DA, Abelmann [place], Craver LS and Burchenal [facility] [phone]) The use of the nitrogen mustards in the palliative treatment of carcinoma: with particular reference to bronchogenic carcinoma Cancer 1 634-56  LABORATORY DATA:  Lab Results  Component Value Date   WBC 9.6 03/12/2021   HGB 13.4 03/12/2021   HCT 41.5 03/12/2021   MCV 81.1 03/12/2021   PLT 205 03/12/2021   Lab Results  Component Value Date   NA 141 09/03/2021   K 3.7 09/03/2021   CL 99 09/03/2021   CO2 23 09/03/2021   Lab Results  Component Value Date   ALT 8 09/03/2021   AST 9 09/03/2021   ALKPHOS 91 09/03/2021   BILITOT 0.3 09/03/2021     RADIOGRAPHY: No results found.    IMPRESSION/PLAN: 1. 66 y.o. gentleman with Stage T1c adenocarcinoma of the prostate  with Gleason Score of 3+4, and PSA of 9.88. We discussed the patient's workup and outlined the nature of prostate cancer in this setting. The patient's T stage, Gleason's score, and PSA put him into the favorable intermediate risk group. Accordingly,  he is eligible for a variety of potential treatment options including brachytherapy, 5.5 weeks of external radiation, or prostatectomy. We discussed the available radiation techniques, and focused on the details and logistics of delivery.  We discussed and outlined the risks, benefits, short and long-term effects associated with radiotherapy and compared and contrasted these with prostatectomy.  He is not an ideal candidate for brachytherapy given his severe LUTS.  We had a detailed discussion and he was informed that his urinary symptoms would most certainly worsen and that he is at a higher risk for possible urinary retention, postprocedure.  We discussed the role of SpaceOAR gel in reducing the rectal toxicity associated with radiotherapy. We discussed some of the potential advantages of surgery including surgical staging, rapid relief of LUTS, the availability of salvage radiotherapy to the prostatic fossa, and the confidence associated with immediate biochemical response.  We also briefly discussed some of the potential proven indications for postoperative radiotherapy including positive margins, extracapsular extension, and seminal vesicle involvement. We also talked about some of the other potential findings leading to a recommendation for radiotherapy including a non-zero postoperative PSA and positive lymph nodes. He appears to have a good understanding of his disease and our treatment recommendations which are of curative intent.  He was encouraged to ask questions that were answered to his stated satisfaction.  At the conclusion of our conversation, the patient remains undecided regarding his treatment preference.  He is scheduled to meet with Dr. Louis Meckel on 11/29/2021 to discuss his surgical options and intends to reach a decision shortly thereafter.  He has our contact information and will let us know if he ultimately decides to proceed with radiotherapy.  We enjoyed meeting him today and look  forward to continuing to participate in his care.  We personally spent 70 minutes in this encounter including chart review, reviewing radiological studies, meeting face-to-face with the patient, entering orders and completing documentation.    Nicholos Johns, PA-C    Tyler Pita, MD  [facility] Direct Dial: [phone]  Fax: [phone] [email or web address]  Skype  [email or web address]

## 2021-10-25 ENCOUNTER — Other Ambulatory Visit (HOSPITAL_COMMUNITY): Payer: Self-pay

## 2021-10-26 ENCOUNTER — Ambulatory Visit: Admit: 2021-10-26 | Payer: Medicare Other | Admitting: Otolaryngology

## 2021-10-26 HISTORY — DX: Other specified health status: Z78.9

## 2021-10-26 SURGERY — EXCISION, MASS, NECK
Anesthesia: General

## 2021-11-07 ENCOUNTER — Other Ambulatory Visit (HOSPITAL_COMMUNITY): Payer: Self-pay

## 2021-11-09 ENCOUNTER — Other Ambulatory Visit (HOSPITAL_COMMUNITY): Payer: Self-pay

## 2021-11-12 ENCOUNTER — Other Ambulatory Visit (HOSPITAL_COMMUNITY): Payer: Self-pay

## 2021-11-12 MED ORDER — CYCLOBENZAPRINE HCL 5 MG PO TABS
5.0000 mg | ORAL_TABLET | Freq: Every day | ORAL | 0 refills | Status: DC | PRN
  Filled 2021-11-12: qty 10, 10d supply, fill #0

## 2021-11-13 ENCOUNTER — Other Ambulatory Visit (HOSPITAL_COMMUNITY): Payer: Self-pay | Admitting: Family Medicine

## 2021-11-13 DIAGNOSIS — R1900 Intra-abdominal and pelvic swelling, mass and lump, unspecified site: Secondary | ICD-10-CM

## 2021-11-21 ENCOUNTER — Other Ambulatory Visit (HOSPITAL_COMMUNITY): Payer: Self-pay

## 2021-11-21 ENCOUNTER — Ambulatory Visit (HOSPITAL_COMMUNITY)
Admission: RE | Admit: 2021-11-21 | Discharge: 2021-11-21 | Disposition: A | Discharge status: Home / Self Care | Payer: Medicare Other | Source: Ambulatory Visit | Attending: Family Medicine | Admitting: Family Medicine

## 2021-11-21 DIAGNOSIS — R1900 Intra-abdominal and pelvic swelling, mass and lump, unspecified site: Secondary | ICD-10-CM | POA: Insufficient documentation

## 2021-11-21 MED ORDER — MOUNJARO 2.5 MG/0.5ML ~~LOC~~ SOAJ
2.5000 mg | SUBCUTANEOUS | 0 refills | Status: DC
  Filled 2021-11-21: qty 2, 28d supply, fill #0

## 2021-11-22 ENCOUNTER — Other Ambulatory Visit: Payer: Self-pay | Admitting: Internal Medicine

## 2021-11-22 ENCOUNTER — Other Ambulatory Visit (HOSPITAL_COMMUNITY): Payer: Self-pay

## 2021-11-22 DIAGNOSIS — I10 Essential (primary) hypertension: Secondary | ICD-10-CM

## 2021-11-23 ENCOUNTER — Other Ambulatory Visit (HOSPITAL_COMMUNITY): Payer: Self-pay

## 2021-11-27 ENCOUNTER — Other Ambulatory Visit: Payer: Self-pay

## 2021-11-29 ENCOUNTER — Other Ambulatory Visit: Payer: Self-pay

## 2021-12-03 ENCOUNTER — Other Ambulatory Visit: Payer: Self-pay | Admitting: Urology

## 2021-12-03 ENCOUNTER — Telehealth: Payer: Self-pay | Admitting: *Deleted

## 2021-12-03 NOTE — Telephone Encounter (Signed)
CALLED PATIENT TO INFORM OF FID. MARKER AND SPACE OAR PLACEMENT ON 12-25-21 AND HIS SIM ON 12-27-21- ARRIVAL TIME- 8:45 AM @ [facility], INFORMED PATIENT TO ARRIVE WITH A FULL BLADDER AND AN EMPTY BOWEL, SPOKE WITH PATIENT AND HE IS AWARE OF THESE APPTS. AND THE INSTRUCTIONS

## 2021-12-04 ENCOUNTER — Other Ambulatory Visit (HOSPITAL_COMMUNITY): Payer: Self-pay

## 2021-12-04 ENCOUNTER — Other Ambulatory Visit: Payer: Self-pay | Admitting: Internal Medicine

## 2021-12-04 DIAGNOSIS — I10 Essential (primary) hypertension: Secondary | ICD-10-CM

## 2021-12-05 ENCOUNTER — Other Ambulatory Visit (HOSPITAL_COMMUNITY): Payer: Self-pay

## 2021-12-05 ENCOUNTER — Telehealth: Payer: Self-pay | Admitting: Cardiovascular Disease

## 2021-12-05 NOTE — Telephone Encounter (Signed)
   Pre-operative Risk Assessment    Patient Name: Thomas Gardner  DOB: Aug 16, 1955 MRN: [id number]      Request for Surgical Clearance    Procedure:   Fiducal Markers and Space OAR  Date of Surgery:  Clearance 12/25/21                                 Surgeon:  Dr. Dayna Ramus Surgeon's Group or Practice Name:  [facility] Phone number:  [phone] Fax number:  [phone]   Type of Clearance Requested:   - Pharmacy:  Hold Aspirin 5 days Prior   Type of Anesthesia:  MAC   Additional requests/questions:  Please advise surgeon/provider what medications should be held.  Signed, Belisicia T Harris   12/05/2021, 4:30 PM

## 2021-12-07 ENCOUNTER — Telehealth: Payer: Self-pay | Admitting: *Deleted

## 2021-12-07 NOTE — Telephone Encounter (Signed)
Pt agreeable to plan of care for tele pre op appt 12/13/21 @ 9:20. Med rec and consent are done.

## 2021-12-07 NOTE — Telephone Encounter (Signed)
Pt agreeable to plan of care for tele pre op appt 12/13/21 @ 9:20. Med rec and consent are done.     Patient Consent for Virtual Visit        Thomas Gardner has provided verbal consent on 12/07/2021 for a virtual visit (video or telephone).   CONSENT FOR VIRTUAL VISIT FOR:  Thomas Gardner  By participating in this virtual visit I agree to the following:  I hereby voluntarily request, consent and authorize [facility] and its employed or contracted physicians, physician assistants, nurse practitioners or other licensed health care professionals (the Practitioner), to provide me with telemedicine health care services (the "Services") as deemed necessary by the treating Practitioner. I acknowledge and consent to receive the Services by the Practitioner via telemedicine. I understand that the telemedicine visit will involve communicating with the Practitioner through live audiovisual communication technology and the disclosure of certain medical information by electronic transmission. I acknowledge that I have been given the opportunity to request an in-person assessment or other available alternative prior to the telemedicine visit and am voluntarily participating in the telemedicine visit.  I understand that I have the right to withhold or withdraw my consent to the use of telemedicine in the course of my care at any time, without affecting my right to future care or treatment, and that the Practitioner or I may terminate the telemedicine visit at any time. I understand that I have the right to inspect all information obtained and/or recorded in the course of the telemedicine visit and may receive copies of available information for a reasonable fee.  I understand that some of the potential risks of receiving the Services via telemedicine include:  Delay or interruption in medical evaluation due to technological equipment failure or disruption; Information transmitted may not be sufficient (e.g.  poor resolution of images) to allow for appropriate medical decision making by the Practitioner; and/or  In rare instances, security protocols could fail, causing a breach of personal health information.  Furthermore, I acknowledge that it is my responsibility to provide information about my medical history, conditions and care that is complete and accurate to the best of my ability. I acknowledge that Practitioner's advice, recommendations, and/or decision may be based on factors not within their control, such as incomplete or inaccurate data provided by me or distortions of diagnostic images or specimens that may result from electronic transmissions. I understand that the practice of medicine is not an exact science and that Practitioner makes no warranties or guarantees regarding treatment outcomes. I acknowledge that a copy of this consent can be made available to me via my patient portal ([facility]), or I can request a printed copy by calling the office of [facility].    I understand that my insurance will be billed for this visit.   I have read or had this consent read to me. I understand the contents of this consent, which adequately explains the benefits and risks of the Services being provided via telemedicine.  I have been provided ample opportunity to ask questions regarding this consent and the Services and have had my questions answered to my satisfaction. I give my informed consent for the services to be provided through the use of telemedicine in my medical care

## 2021-12-07 NOTE — Telephone Encounter (Signed)
Primary Cardiologist:None   Preoperative team, please contact this patient and set up a phone call appointment for further preoperative risk assessment. Please obtain consent and complete medication review. Thank you for your help.   As previously advised by Dr. Oval Linsey, clearance for holding aspirin will need to be directed to neurology due to history of stroke.    Emmaline Life, NP-C    12/07/2021, 11:30 AM [place] [address], [address] Office [phone] Fax [phone]

## 2021-12-10 ENCOUNTER — Other Ambulatory Visit: Payer: Self-pay

## 2021-12-13 ENCOUNTER — Other Ambulatory Visit (HOSPITAL_COMMUNITY): Payer: Self-pay

## 2021-12-13 ENCOUNTER — Ambulatory Visit (INDEPENDENT_AMBULATORY_CARE_PROVIDER_SITE_OTHER): Payer: Medicare Other | Admitting: Nurse Practitioner

## 2021-12-13 ENCOUNTER — Other Ambulatory Visit: Payer: Self-pay | Admitting: Family Medicine

## 2021-12-13 ENCOUNTER — Other Ambulatory Visit: Payer: Self-pay | Admitting: Internal Medicine

## 2021-12-13 DIAGNOSIS — E0800 Diabetes mellitus due to underlying condition with hyperosmolarity without nonketotic hyperglycemic-hyperosmolar coma (NKHHC): Secondary | ICD-10-CM

## 2021-12-13 DIAGNOSIS — Z01810 Encounter for preprocedural cardiovascular examination: Secondary | ICD-10-CM | POA: Diagnosis not present

## 2021-12-13 DIAGNOSIS — I10 Essential (primary) hypertension: Secondary | ICD-10-CM

## 2021-12-13 MED ORDER — CARVEDILOL 25 MG PO TABS
25.0000 mg | ORAL_TABLET | Freq: Two times a day (BID) | ORAL | 3 refills | Status: DC
  Filled 2021-12-13: qty 180, 90d supply, fill #0
  Filled 2022-02-07 – 2022-05-14 (×2): qty 180, 90d supply, fill #1
  Filled 2022-09-03: qty 180, 90d supply, fill #2

## 2021-12-13 NOTE — Progress Notes (Signed)
Virtual Visit via Telephone Note   Because of Thomas Gardner's co-morbid illnesses, he is at least at moderate risk for complications without adequate follow up.  This format is felt to be most appropriate for this patient at this time.  The patient did not have access to video technology/had technical difficulties with video requiring transitioning to audio format only (telephone).  All issues noted in this document were discussed and addressed.  No physical exam could be performed with this format.  Please refer to the patient's chart for his consent to telehealth for Thomas Gardner.  Evaluation Performed:  Preoperative cardiovascular risk assessment _____________   Date:  12/13/2021   Patient ID:  Thomas Gardner, DOB 02/23/1956, MRN [id number] Patient Location:  Home Provider location:   Office  Primary Care Provider:  Roselee Nova, MD Primary Cardiologist:  Skeet Latch, MD  Chief Complaint / Patient Profile   66 y.o. y/o male with a h/o diastolic heart failure, hypertension, hyperlipidemia, CVA, type 2 diabetes, OSA, GERD, arthritis, thyroid nodule, and prostate cancer who is pending fiducal markers and space OAR on 12/25/2021 with Dr. Dayna Ramus of [facility] and presents today for telephonic preoperative cardiovascular risk assessment.  Past Medical History    Past Medical History:  Diagnosis Date   Acute stroke of medulla oblongata ([place]) 03/07/2021   Arthritis    Asthma    CHF (congestive heart failure) ([place])    Diabetes mellitus 2003   Familial hyperlipidemia 05/07/2021   GERD (gastroesophageal reflux disease)    Hyperlipidemia    Hypertension    Medical history non-contributory    Morbid obesity ([place])    Sleep apnea    Past Surgical History:  Procedure Laterality Date   BREATH TEK H PYLORI  11/11/2011   Procedure: BREATH TEK H PYLORI;  Surgeon: Shann Medal, MD;  Location: [facility];  Service: General;  Laterality: N/A;   CARDIAC CATHETERIZATION      ESOPHAGOGASTRODUODENOSCOPY (EGD) WITH PROPOFOL N/A 05/04/2014   Procedure: ESOPHAGOGASTRODUODENOSCOPY (EGD) WITH PROPOFOL;  Surgeon: Cleotis Nipper, MD;  Location: [facility];  Service: Endoscopy;  Laterality: N/A;    Allergies  Allergies  Allergen Reactions   Erythromycin Nausea And Vomiting    History of Present Illness    Thomas Gardner is a 66 y.o. male who presents via audio/video conferencing for a telehealth visit today.  Pt was last seen in cardiology clinic on 09/03/2021 by Dr. Oval Linsey.  At that time Thomas Gardner was doing well. The patient is now pending procedure as outlined above. Since his last visit, he has been stable from a cardiac standpoint.  He does note intermittent lightheadedness well as dependent bilateral lower extremity edema, however, this is not new. He denies chest pain, palpitations, dyspnea, pnd, orthopnea, n, v, dizziness, syncope, weight gain, or early satiety. All other systems reviewed and are otherwise negative except as noted above.   Home Medications    Prior to Admission medications   Medication Sig Start Date End Date Taking? Authorizing Provider  acetaminophen (TYLENOL) 325 MG tablet Take 1-2 tablets (325-650 mg total) by mouth every 4 (four) hours as needed for mild pain. 03/14/21   Angiulli, Lavon Paganini, PA-C  albuterol (VENTOLIN HFA) 108 (90 Base) MCG/ACT inhaler Inhale 2 puffs into the lungs every 6 (six) hours as needed for wheezing or shortness of breath. [facility] 03/14/21   Angiulli, Lavon Paganini, PA-C  aspirin 81 MG EC tablet Take 1 tablet (81 mg total)  by mouth daily. Swallow whole. 07/05/21   Skeet Latch, MD  carvedilol (COREG) 25 MG tablet Take 1 tablet (25 mg total) by mouth 2 (two) times daily with a meal. 03/22/21   Jose Persia, MD  chlorthalidone (HYGROTON) 25 MG tablet Take 1 tablet (25 mg total) by mouth daily. 10/12/21     cyclobenzaprine (FLEXERIL) 5 MG tablet Take 1 tablet (5 mg total) by mouth daily as needed for low  back pain 11/12/21     empagliflozin (JARDIANCE) 25 MG TABS tablet Take 1 tablet (25 mg total) by mouth in the morning. 10/12/21     Evolocumab (REPATHA SURECLICK) 810 MG/ML SOAJ Inject 140 mg into the skin every 14 (fourteen) days. 07/09/21   Skeet Latch, MD  furosemide (LASIX) 40 MG tablet Take 1 tablet (40 mg total) by mouth daily as needed. 09/03/21 12/02/21  Skeet Latch, MD  insulin glargine (LANTUS SOLOSTAR) 100 UNIT/ML Solostar Pen Inject 20 Units into the skin 2 (two) times daily. Patient taking differently: Inject 16 Units into the skin 2 (two) times daily. 07/12/21   Scarlett Presto, MD  levofloxacin (LEVAQUIN) 750 MG tablet Take 1 tablet (750 mg total) by mouth as directed 1 hour prior to your scheduled procedure Patient not taking: Reported on 10/18/2021 09/10/21     losartan (COZAAR) 25 MG tablet Take 1 tablet (25 mg total) by mouth every morning AND 0.5 tablets (12.5 mg total) at bedtime. 07/30/21     omeprazole (PRILOSEC) 40 MG capsule Take 1 capsule (40 mg total) by mouth daily. Patient taking differently: Take 40 mg by mouth daily as needed (acid reflux). 03/22/21   Jose Persia, MD  OVER THE COUNTER MEDICATION Take 2 Scoops by mouth daily. [facility]    [provider]  rosuvastatin (CRESTOR) 10 MG tablet Take 1 tablet (10 mg total) by mouth every Monday, Wednesday, and Friday. 09/03/21   Skeet Latch, MD  rosuvastatin (CRESTOR) 40 MG tablet Take 1 tablet (40 mg total) by mouth at bedtime. Patient not taking: Reported on 12/07/2021 10/18/21     tirzepatide [facility]) 2.5 MG/0.5ML Pen Inject 2.5 mg into the skin once a week. 11/21/21     tirzepatide (MOUNJARO) 2.5 MG/0.5ML Pen Inject 2.5 mg into the skin once a week.    [provider]  tirzepatide Darcel Bayley) 5 MG/0.5ML Pen Inject 5 mg into the skin once a week. Patient not taking: Reported on 12/07/2021 11/01/21     vitamin C (ASCORBIC ACID) 500 MG tablet Take 1 tablet (500 mg total) by mouth daily. 04/19/21   Farrel Gordon, DO  Vitamin D3 (VITAMIN D) 25 MCG tablet Take 1 tablet (1,000 Units total) by mouth daily. 04/19/21   Farrel Gordon, DO    Physical Exam    Vital Signs:  Thomas Gardner does not have vital signs available for review today.  Given telephonic nature of communication, physical exam is limited. AAOx3. NAD. Normal affect.  Speech and respirations are unlabored.  Accessory Clinical Findings    None  Assessment & Plan    1.  Preoperative Cardiovascular Risk Assessment:  According to the Revised Cardiac Risk Index (RCRI), his Perioperative Risk of Major Cardiac Event is (%): 11. His Functional Capacity in METs is: 5.38 according to the [facility] Activity Status Index (DASI).Therefore, based on ACC/AHA guidelines, patient would be at acceptable risk for the planned procedure without further cardiovascular testing.   As previously advised by Dr. Oval Linsey, clearance for holding aspirin will need to be directed to  neurology due to history of stroke.   A copy of this note will be routed to requesting surgeon.  Time:   Today, I have spent 5 minutes with the patient with telehealth technology discussing medical history, symptoms, and management plan.     Lenna Sciara, NP  12/13/2021, 9:28 AM

## 2021-12-14 ENCOUNTER — Other Ambulatory Visit: Payer: Self-pay | Admitting: Family Medicine

## 2021-12-14 ENCOUNTER — Other Ambulatory Visit (HOSPITAL_COMMUNITY): Payer: Self-pay

## 2021-12-14 DIAGNOSIS — I10 Essential (primary) hypertension: Secondary | ICD-10-CM

## 2021-12-14 DIAGNOSIS — E0800 Diabetes mellitus due to underlying condition with hyperosmolarity without nonketotic hyperglycemic-hyperosmolar coma (NKHHC): Secondary | ICD-10-CM

## 2021-12-14 MED ORDER — LANTUS SOLOSTAR 100 UNIT/ML ~~LOC~~ SOPN
16.0000 [IU] | PEN_INJECTOR | Freq: Two times a day (BID) | SUBCUTANEOUS | 11 refills | Status: DC
  Filled 2021-12-14: qty 12, 38d supply, fill #0
  Filled 2022-01-16: qty 12, 38d supply, fill #1
  Filled 2022-02-07 – 2022-02-26 (×2): qty 12, 38d supply, fill #2
  Filled 2022-04-04: qty 12, 38d supply, fill #3
  Filled 2022-05-14: qty 12, 38d supply, fill #4
  Filled 2022-06-24 (×2): qty 12, 38d supply, fill #5
  Filled 2022-07-29: qty 12, 38d supply, fill #6
  Filled 2022-09-03: qty 12, 38d supply, fill #7
  Filled 2022-10-09: qty 12, 38d supply, fill #8
  Filled 2022-11-13: qty 12, 38d supply, fill #9

## 2021-12-17 ENCOUNTER — Other Ambulatory Visit (HOSPITAL_COMMUNITY): Payer: Self-pay

## 2021-12-17 ENCOUNTER — Other Ambulatory Visit: Payer: Self-pay | Admitting: Family Medicine

## 2021-12-17 ENCOUNTER — Telehealth: Payer: Self-pay | Admitting: Adult Health

## 2021-12-17 DIAGNOSIS — I10 Essential (primary) hypertension: Secondary | ICD-10-CM

## 2021-12-17 DIAGNOSIS — Z794 Long term (current) use of insulin: Secondary | ICD-10-CM

## 2021-12-17 NOTE — Telephone Encounter (Signed)
Contacted pam back, informed her to fax [place] a surgical clearance for what is needed. She verbally understood and was appreciative.

## 2021-12-17 NOTE — Telephone Encounter (Signed)
[facility] Thomas Gardner) requesting surgical clearance hold ton aspirin for surgery on August 8. We would  need to put on hold for 5 days. Would like a call from the nurse.

## 2021-12-18 ENCOUNTER — Encounter (HOSPITAL_BASED_OUTPATIENT_CLINIC_OR_DEPARTMENT_OTHER): Payer: Self-pay | Admitting: Urology

## 2021-12-18 ENCOUNTER — Other Ambulatory Visit: Payer: Self-pay

## 2021-12-18 NOTE — Telephone Encounter (Signed)
Form and Jessica's recommendations attached and faxed back to [facility], confirmation received.

## 2021-12-18 NOTE — Telephone Encounter (Signed)
Surgical clearance received , placed on NP desk for review

## 2021-12-18 NOTE — Telephone Encounter (Signed)
[facility] requesting clearance to undergo fiducial marker placement in the prostate tentatively scheduled 12/25/2021.  History of stroke in 02/2021.  Has been stable since that time without any new or reoccurring strokes or stroke type symptoms.  Cleared to pursue surgical procedure with holding aspirin 5 days prior as requested with small but acceptable risk of preprocedural stroke while off therapy and recommend restarting immediately after or once hemodynamically stable.  Please provide this with clearance form. Form signed and placed in outbox.  Thank you.

## 2021-12-18 NOTE — Progress Notes (Addendum)
Spoke w/ via phone for pre-op interview---pt Lab needs dos----   I stat            Lab results------ COVID test -----patient states asymptomatic no test needed Arrive at -------600 am 12-25-2021 NPO after MN NO Solid Food.  Clear liquids from MN until---500 am Med rec completed Medications to take morning of surgery -----albuterol inhaler prnbring inhaler, carvedilol Diabetic medication -----none day of surgery, take 1/2 dose pm lantus night before surgery Patient instructed no nail polish to be worn day of surgery Patient instructed to bring photo id and insurance card day of surgery Patient aware to have Driver (ride ) / caregiver  patient aware driver home and caregiver for overnight needed and will pt will arrange    for 24 hours after surgery  Patient Special Instructions -----stop 81 mg aspirin 5 days before surgery per Janett Billow mccue np note 12-18-2021 chart/epic (pt aware last dose to be 12-19-2021) Pre-Op special Istructions -----fleets enema am of surgery Patient verbalized understanding of instructions that were given at this phone interview. Patient denies  any cardiac s & s, shortness of breath, chest pain, fever, cough at this phone interview.   Cardiac clearance note emily monge np 12-13-2021 chart/epic Stress echo 09-20-2013 [facility] Ekg 05-07-2021 chart/epic

## 2021-12-19 ENCOUNTER — Other Ambulatory Visit: Payer: Self-pay | Admitting: Family Medicine

## 2021-12-19 ENCOUNTER — Other Ambulatory Visit: Payer: Self-pay | Admitting: Urology

## 2021-12-19 ENCOUNTER — Other Ambulatory Visit (HOSPITAL_COMMUNITY): Payer: Self-pay

## 2021-12-19 DIAGNOSIS — I10 Essential (primary) hypertension: Secondary | ICD-10-CM

## 2021-12-19 DIAGNOSIS — E0800 Diabetes mellitus due to underlying condition with hyperosmolarity without nonketotic hyperglycemic-hyperosmolar coma (NKHHC): Secondary | ICD-10-CM

## 2021-12-19 MED ORDER — FLEET ENEMA 7-19 GM/118ML RE ENEM: 1.0000 | ENEMA | Freq: Once | RECTAL | Status: AC

## 2021-12-20 ENCOUNTER — Other Ambulatory Visit (HOSPITAL_COMMUNITY): Payer: Self-pay

## 2021-12-24 ENCOUNTER — Other Ambulatory Visit (HOSPITAL_COMMUNITY): Payer: Self-pay

## 2021-12-24 ENCOUNTER — Other Ambulatory Visit: Payer: Self-pay | Admitting: Family Medicine

## 2021-12-24 ENCOUNTER — Encounter (HOSPITAL_COMMUNITY): Payer: Self-pay | Admitting: Anesthesiology

## 2021-12-24 DIAGNOSIS — Z794 Long term (current) use of insulin: Secondary | ICD-10-CM

## 2021-12-24 DIAGNOSIS — I10 Essential (primary) hypertension: Secondary | ICD-10-CM

## 2021-12-24 NOTE — Progress Notes (Signed)
Patient had Rad Onc consult on 6/7 for Stage T1c adenocarcinoma of the prostate with Gleason Score of 3+4, and PSA of 9.88.   Patient agreeable to proceed with daily radiation and is scheduled for fiducial's on 8/8 followed by CT Sim on 8/10.    Pt contact RN today to cancel upcoming appointments due to urgent upcoming appointments he would like to address prior to starting radiation treatment.    Pt agreeable for RN to contact in 2 weeks to reassess time frame for starting back on radiation treatment plan.  MD notified.

## 2021-12-24 NOTE — H&P (Signed)
Prostate cancer   HPI: Thomas Gardner is a 66 year old male with T1c, grade 2 prostate cancer. Hx of CVA in 02/2021 (currently on aspirin).   Last PSA: 9.88  Biopsy Date: 09/27/2021  TNM stage: T1c  Gleason score: 3+4 = 7  Left: 2 out of 4 cores from the ROI involving the left anterior peripheral zone of the mid gland came back with grade 2 prostate cancer. An additional core at the lateral apex also had grade 2 prostate cancer  Right: Grade 2 prostate cancer involving the lateral mid gland  Prostate volume: 43 cm  SHIM: 4  AUASS: 17/4   He has done well after the biopsy and denies residual hematuria, dysuria or blood per rectum.   Interval: Today the patient is here for further discussion of radical prostatectomy for his prostate cancer. The patient has met with Dr. Tammi Klippel and discussed radiation. He has not no progression of his lower urinary tract symptoms. He has mild to moderate voiding symptoms and quite significant erectile dysfunction.   The patient lives alone, not married.   The patient has a history of poorly controlled diabetes, hypertension and obesity. He has recovered reasonably well from his stroke in October 2022.     ALLERGIES: Erythromycin Derivatives    MEDICATIONS: Levofloxacin 750 mg tablet 1 tablet PO As Directed Take one hour prior to your scheduled procedure.  Valium 10 mg tablet 2 tablet PO once take one hour prior to procedure  Aspirin Ec 81 mg tablet, delayed release 0 Oral  Carvedilol 25 mg tablet Oral  Chlorthalidone 25 MG Oral Tablet 1 Oral Daily  Jardiance  Lantus Solostar 100 unit/ml (3 ml) insulin pen Subcutaneous  Losartan Potassium  Victoza 2-Pak     GU PSH: Prostate Needle Biopsy - 09/27/2021       PSH Notes: Cath Stent Placement   NON-GU PSH: Surgical Pathology, Gross And Microscopic Examination For Prostate Needle - 09/27/2021     GU PMH: Prostate Cancer - 10/08/2021 Elevated PSA - 09/27/2021, - 07/13/2021, Elevated prostate specific  antigen (PSA), - 2014 Bladder-neck stenosis/contracture, 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, Erectile dysfunction due to arterial insufficiency - 2014 Obstructive and reflux uropathy, Unspec, Obstructive uropathy - 2014 Spermatocele of epididymis, Unspec, Spermatocele - 2014      PMH Notes:  2008-08-23 11:34:23 - Note: Arthritis   NON-GU PMH: Anxiety, Anxiety - 2014 Asthma, Asthma - 2014 Personal history of other diseases of the circulatory system, History of hypertension - 2014, History of congestive heart disease, - 2014 Personal history of other diseases of the digestive system, History of esophageal reflux - 2014 Personal history of other diseases of the nervous system and sense organs, History of sleep apnea - 2014 Personal history of other endocrine, nutritional and metabolic disease, History of hypercholesterolemia - 2014 Personal history of other mental and behavioral disorders, History of depression - 2014 Type 1 diabetes mellitus without complications, Type I Diabetes Mellitus - 2014 Encounter for general adult medical examination without abnormal findings, Encounter for preventive health examination    FAMILY HISTORY: Cancer - Brother Cervical Cancer - Mother Diabetes - Mother Family Health Status - Father alive at age 74 - 74 In Family Family Health Status - Mother's Age - Runs In Family Heart Disease - Uncle Hypertension - Uncle sickle cell anemia - Runs In Family   SOCIAL HISTORY: Marital Status: Single Preferred Language: English; Ethnicity: Not Hispanic Or Latino; Race: Black or African American Current Smoking Status:  Patient has never smoked.   Tobacco Use Assessment Completed: Used Tobacco in last 30 days? Social Drinker.  Drinks 1 caffeinated drink per day.     Notes: Never A Smoker, Occupation:, Caffeine Use, Marital History - Single, Tobacco Use, Alcohol Use   REVIEW OF SYSTEMS:    GU Review Male:   Patient denies frequent  urination, hard to postpone urination, burning/ pain with urination, get up at night to urinate, leakage of urine, stream starts and stops, trouble starting your stream, have to strain to urinate , erection problems, and penile pain.  Gastrointestinal (Upper):   Patient denies nausea, vomiting, and indigestion/ heartburn.  Gastrointestinal (Lower):   Patient denies diarrhea and constipation.  Constitutional:   Patient denies fever, weight loss, night sweats, and fatigue.  Skin:   Patient denies skin rash/ lesion and itching.  Eyes:   Patient denies blurred vision and double vision.  Ears/ Nose/ Throat:   Patient denies sore throat and sinus problems.  Hematologic/Lymphatic:   Patient denies swollen glands and easy bruising.  Cardiovascular:   Patient denies leg swelling and chest pains.  Respiratory:   Patient denies cough and shortness of breath.  Endocrine:   Patient denies excessive thirst.  Musculoskeletal:   Patient denies back pain and joint pain.  Neurological:   Patient denies headaches and dizziness.  Psychologic:   Patient denies depression and anxiety.   VITAL SIGNS: None   Complexity of Data:  Source Of History:  Patient  Lab Test Review:   PSA  Records Review:   AUA Symptom Score, Pathology Reports, Previous Doctor Records, Previous Patient Records, IIEF Score, POC Tool  Urine Test Review:   Urinalysis   07/13/21  PSA  Total PSA 9.88 ng/mL  Free PSA 1.17 ng/mL  % Free PSA 12 % PSA    08/23/08  Hormones  Testosterone, Total 148.0     PROCEDURES:          Urinalysis w/Scope Dipstick Dipstick Cont'd Micro  Color: Yellow Bilirubin: Neg mg/dL WBC/hpf: NS (Not Seen)  Appearance: Clear Ketones: Neg mg/dL RBC/hpf: 0 - 2/hpf  Specific Gravity: 1.015 Blood: Neg ery/uL Bacteria: NS (Not Seen)  pH: 5.5 Protein: 1+ mg/dL Cystals: NS (Not Seen)  Glucose: 2+ mg/dL Urobilinogen: 0.2 mg/dL Casts: NS (Not Seen)    Nitrites: Neg Trichomonas: Not Present    Leukocyte Esterase: Neg  leu/uL Mucous: Not Present      Epithelial Cells: NS (Not Seen)      Yeast: NS (Not Seen)      Sperm: Not Present    ASSESSMENT:      ICD-10 Details  1 GU:   Prostate Cancer - C61      PLAN:           Document Letter(s):  Created for Patient: Clinical Summary         Notes:   I discussed robotic prostatectomy with the patient. We went through the expectations of the surgery, hospitalization, and the postoperative course. We discussed the associated risks and the benefits. I discussed with him the major side effects of erectile dysfunction and urinary incontinence. I went through everything with him in significant detail.   The patient is still contemplating his decision as to how he would like to proceed. He will contact us if he would like to to have surgery for his prostate cancer. He does have some significant comorbidities including poorly controlled diabetes. I recommended that he continue to work aggressively on that. He will follow-up  with [place] once he has made his decision.    E & M CODES: We spent 30 minutes dedicated to evaluation and management time, including face to face interaction, discussions on coordination of care, documentation, result review, and discussion with others as applicable.

## 2021-12-24 NOTE — Anesthesia Preprocedure Evaluation (Signed)
Anesthesia Evaluation    Reviewed: Allergy & Precautions, Patient's Chart, lab work & pertinent test results  Airway        Dental   Pulmonary sleep apnea and Continuous Positive Airway Pressure Ventilation ,           Cardiovascular hypertension, Pt. on medications and Pt. on home beta blockers +CHF       Neuro/Psych Anxiety CVA, Residual Symptoms    GI/Hepatic GERD  ,  Endo/Other  diabetes  Renal/GU    Prostate CA    Musculoskeletal   Abdominal   Peds  Hematology   Anesthesia Other Findings   Reproductive/Obstetrics                             Anesthesia Physical Anesthesia Plan  ASA: 3  Anesthesia Plan: MAC   Post-op Pain Management: Minimal or no pain anticipated   Induction: Intravenous  PONV Risk Score and Plan: Treatment may vary due to age or medical condition, TIVA, Propofol infusion and Midazolam  Airway Management Planned: Nasal Cannula, Natural Airway and Simple Face Mask  Additional Equipment: None  Intra-op Plan:   Post-operative Plan:   Informed Consent:     Dental advisory given  Plan Discussed with:   Anesthesia Plan Comments: (Pt on Juardiance qD)        Anesthesia Quick Evaluation

## 2021-12-25 ENCOUNTER — Other Ambulatory Visit: Payer: Self-pay | Admitting: Family Medicine

## 2021-12-25 ENCOUNTER — Ambulatory Visit (HOSPITAL_BASED_OUTPATIENT_CLINIC_OR_DEPARTMENT_OTHER): Admission: RE | Admit: 2021-12-25 | Payer: Medicare Other | Source: Home / Self Care | Admitting: Urology

## 2021-12-25 ENCOUNTER — Other Ambulatory Visit (HOSPITAL_COMMUNITY): Payer: Self-pay

## 2021-12-25 DIAGNOSIS — Z01818 Encounter for other preprocedural examination: Secondary | ICD-10-CM

## 2021-12-25 DIAGNOSIS — Z794 Long term (current) use of insulin: Secondary | ICD-10-CM

## 2021-12-25 DIAGNOSIS — I10 Essential (primary) hypertension: Secondary | ICD-10-CM

## 2021-12-25 HISTORY — DX: Localized edema: R60.0

## 2021-12-25 HISTORY — DX: Dependence on other enabling machines and devices: Z99.89

## 2021-12-25 HISTORY — DX: Malignant neoplasm of prostate: C61

## 2021-12-25 HISTORY — DX: Other complications of anesthesia, initial encounter: T88.59XA

## 2021-12-25 HISTORY — DX: Constipation, unspecified: K59.00

## 2021-12-25 SURGERY — INSERTION, GOLD SEEDS
Anesthesia: Monitor Anesthesia Care

## 2021-12-26 ENCOUNTER — Other Ambulatory Visit (HOSPITAL_COMMUNITY): Payer: Self-pay

## 2021-12-26 ENCOUNTER — Other Ambulatory Visit: Payer: Self-pay | Admitting: Family Medicine

## 2021-12-26 DIAGNOSIS — Z794 Long term (current) use of insulin: Secondary | ICD-10-CM

## 2021-12-26 DIAGNOSIS — I10 Essential (primary) hypertension: Secondary | ICD-10-CM

## 2021-12-27 ENCOUNTER — Ambulatory Visit: Payer: Medicare Other | Admitting: Radiation Oncology

## 2022-01-01 ENCOUNTER — Other Ambulatory Visit (HOSPITAL_COMMUNITY): Payer: Self-pay

## 2022-01-07 ENCOUNTER — Other Ambulatory Visit (HOSPITAL_COMMUNITY): Payer: Self-pay

## 2022-01-07 NOTE — Progress Notes (Signed)
Patient rad onc consult on 6/7 for Stage T1c adenocarcinoma of the prostate with Gleason Score of 3+4, and PSA of 9.88.   Patient was agreeable to proceed with daily radiation and is scheduled for fiducial's on 8/8 with Dr. Jeffie Pollock, followed by CT Sim on 8/10 however had to cancel due to personal issues that will be addressed by his PCP.     RN reached out to patient today to assess needs and confirm if he was ready to move forward with treatment.  Pt agreeable to move forward and we will get him scheduled for fiducial's, spaceOAR, and CT Sim.  Pt verbalized appreciation and will be contacted with future appointments.

## 2022-01-08 ENCOUNTER — Telehealth: Payer: Self-pay | Admitting: Pharmacist

## 2022-01-08 DIAGNOSIS — E782 Mixed hyperlipidemia: Secondary | ICD-10-CM

## 2022-01-08 DIAGNOSIS — I63332 Cerebral infarction due to thrombosis of left posterior cerebral artery: Secondary | ICD-10-CM

## 2022-01-08 NOTE — Telephone Encounter (Signed)
Key: [id number]

## 2022-01-08 NOTE — Telephone Encounter (Signed)
Patient needs labs for [employer] pt and asked him to come in for fasting labs.

## 2022-01-16 ENCOUNTER — Other Ambulatory Visit (HOSPITAL_COMMUNITY): Payer: Self-pay

## 2022-01-16 LAB — APOLIPOPROTEIN B: Apolipoprotein B: 93 mg/dL — ABNORMAL HIGH (ref <90)

## 2022-01-16 LAB — LIPID PANEL
Chol/HDL Ratio: 4.9 ratio (ref 0.0–5.0)
Cholesterol, Total: 156 mg/dL (ref 100–199)
HDL: 32 mg/dL — ABNORMAL LOW (ref >39)
LDL Chol Calc (NIH): 91 mg/dL (ref 0–99)
Triglycerides: 192 mg/dL — ABNORMAL HIGH (ref 0–149)
VLDL Cholesterol Cal: 33 mg/dL (ref 5–40)

## 2022-01-16 NOTE — Telephone Encounter (Signed)
PA approved to 05/19/22  [employer]

## 2022-01-23 ENCOUNTER — Other Ambulatory Visit (HOSPITAL_COMMUNITY): Payer: Self-pay

## 2022-01-23 MED ORDER — POTASSIUM CHLORIDE ER 10 MEQ PO CPCR
10.0000 meq | ORAL_CAPSULE | Freq: Every day | ORAL | 0 refills | Status: DC
  Filled 2022-01-23: qty 3, 3d supply, fill #0

## 2022-01-25 ENCOUNTER — Other Ambulatory Visit (HOSPITAL_COMMUNITY): Payer: Self-pay

## 2022-01-25 NOTE — Progress Notes (Signed)
[facility] contacted this RN due to patient voicing concern with moving forward with scheduling of fiducial's and spaceOAR.   RN attempted to contact patient to assess.  No answer, no voicemail box.  Will continue to reach out to patient.

## 2022-01-28 NOTE — Progress Notes (Signed)
RN attempted to reach out to patient regarding upcoming plans for fiducial's and spaceOAR.    No answer, no voicemail.  MyChart message sent as well.

## 2022-01-29 NOTE — Progress Notes (Signed)
RN spoke with patient and confirmed that he is OK with proceeding with having fiducial's and spaceOAR gel scheduled.    [facility] notified, pending appointment at this time.

## 2022-02-05 ENCOUNTER — Other Ambulatory Visit: Payer: Self-pay | Admitting: Urology

## 2022-02-07 ENCOUNTER — Other Ambulatory Visit: Payer: Self-pay | Admitting: Family Medicine

## 2022-02-07 ENCOUNTER — Other Ambulatory Visit (HOSPITAL_COMMUNITY): Payer: Self-pay

## 2022-02-07 DIAGNOSIS — Z794 Long term (current) use of insulin: Secondary | ICD-10-CM

## 2022-02-12 ENCOUNTER — Other Ambulatory Visit: Payer: Self-pay | Admitting: Family Medicine

## 2022-02-12 ENCOUNTER — Other Ambulatory Visit (HOSPITAL_COMMUNITY): Payer: Self-pay

## 2022-02-12 DIAGNOSIS — E0800 Diabetes mellitus due to underlying condition with hyperosmolarity without nonketotic hyperglycemic-hyperosmolar coma (NKHHC): Secondary | ICD-10-CM

## 2022-02-17 ENCOUNTER — Other Ambulatory Visit (HOSPITAL_COMMUNITY): Payer: Self-pay

## 2022-02-18 ENCOUNTER — Other Ambulatory Visit: Payer: Self-pay | Admitting: Neurosurgery

## 2022-02-18 DIAGNOSIS — R202 Paresthesia of skin: Secondary | ICD-10-CM

## 2022-02-26 ENCOUNTER — Other Ambulatory Visit (HOSPITAL_COMMUNITY): Payer: Self-pay

## 2022-03-06 ENCOUNTER — Other Ambulatory Visit (HOSPITAL_COMMUNITY): Payer: Self-pay

## 2022-03-06 MED ORDER — DIAZEPAM 5 MG PO TABS
5.0000 mg | ORAL_TABLET | ORAL | 0 refills | Status: DC
  Filled 2022-03-06: qty 2, 1d supply, fill #0

## 2022-03-07 ENCOUNTER — Ambulatory Visit
Admission: RE | Admit: 2022-03-07 | Discharge: 2022-03-07 | Disposition: A | Discharge status: Home / Self Care | Payer: Medicare Other | Source: Ambulatory Visit | Attending: Neurosurgery | Admitting: Neurosurgery

## 2022-03-07 DIAGNOSIS — R202 Paresthesia of skin: Secondary | ICD-10-CM

## 2022-03-14 ENCOUNTER — Other Ambulatory Visit (HOSPITAL_COMMUNITY): Payer: Self-pay

## 2022-03-14 MED ORDER — TIRZEPATIDE 5 MG/0.5ML ~~LOC~~ SOAJ
5.0000 mg | SUBCUTANEOUS | 2 refills | Status: DC
  Filled 2022-03-14: qty 2, 28d supply, fill #0
  Filled 2022-04-04: qty 2, 28d supply, fill #1
  Filled 2022-06-24: qty 2, 28d supply, fill #2

## 2022-03-15 ENCOUNTER — Other Ambulatory Visit (HOSPITAL_COMMUNITY): Payer: Self-pay

## 2022-04-04 ENCOUNTER — Other Ambulatory Visit (HOSPITAL_COMMUNITY): Payer: Self-pay

## 2022-04-10 ENCOUNTER — Encounter (HOSPITAL_BASED_OUTPATIENT_CLINIC_OR_DEPARTMENT_OTHER): Payer: Self-pay | Admitting: Urology

## 2022-04-10 NOTE — Progress Notes (Addendum)
Spoke w/ via phone for pre-op interview--- pt Lab needs dos---- [employer] results------ current EKG in epic/ chart COVID test -----patient states asymptomatic no test needed Arrive at -------  0530 on 04-16-2022 NPO after MN NO Solid Food.  Clear liquids from MN until--- 0430 Med rec completed Medications to take morning of surgery -----  coreg, prilosec Diabetic medication ----- do not take jardiance and do not do lantus insulin morning of surgery.  Do half dose lantus night before surgery Patient instructed no nail polish to be worn day of surgery Patient instructed to bring photo id and insurance card day of surgery Patient aware to have Driver (ride ) / caregiver  for 24 hours after surgery--- cousin, reginald  Patient Special Instructions ----- asked to bring rescue inhaler dos.  Will do fleet enema prior to surgery.  Pt stated aware to stop asa, last dose today 04-10-2022 Pre-Op special Istructions ----- pt has cardiac office visit clearance by Diona Browner NP on 12-13-2021 in epic/ chart. Also received neurologist clearance for asa from Frann Rider NP dated 12-18-2021 via fax from office, placed in chart. Patient verbalized understanding of instructions that were given at this phone interview. Patient denies shortness of breath, chest pain, fever, cough at this phone interview.    Anesthesia Review:   Pt denies cardiac/ stroke s&s, sob, and no peripheral swelling.  Stated CVA residual balance impaired at times.  PCP:  [facility] Cardiologist :  Dr Darene Lamer. Oval Linsey (lov 12-13-2021) Neurologist:   Dr Leonie Man  Cassell Clement 05-23-2021) Chest x-ray : 03-03-2021 EKG : 05-07-2021 Echo : 03-04-2021 Stress test:  nuclear 08-15-2011 epic/  stress echo 09-20-2013 [facility] Cardiac Cath :   2011  per pt done by Dr Einar Gip @ [facility], told no blockages  (Documentation not available) Activity level:  denies sob w/ any acitivity Sleep Study/ CPAP :  Yes/  no Fasting Blood  Sugar :    / Checks Blood Sugar -- times a day:  does not check Blood Thinner/ Instructions /Last Dose:  no ASA / Instructions/ Last Dose : ASA '81mg'$ , last dose 04-10-2022

## 2022-04-15 ENCOUNTER — Other Ambulatory Visit (HOSPITAL_COMMUNITY): Payer: Self-pay

## 2022-04-15 ENCOUNTER — Other Ambulatory Visit: Payer: Self-pay | Admitting: Family Medicine

## 2022-04-15 DIAGNOSIS — J4520 Mild intermittent asthma, uncomplicated: Secondary | ICD-10-CM

## 2022-04-15 MED ORDER — ALBUTEROL SULFATE HFA 108 (90 BASE) MCG/ACT IN AERS
1.0000 | INHALATION_SPRAY | Freq: Four times a day (QID) | RESPIRATORY_TRACT | 3 refills | Status: DC | PRN
  Filled 2022-04-15: qty 6.7, 25d supply, fill #0

## 2022-04-15 NOTE — Anesthesia Preprocedure Evaluation (Signed)
Anesthesia Evaluation  Patient identified by MRN, date of birth, ID band Patient awake    Reviewed: Allergy & Precautions, NPO status , Patient's Chart, lab work & pertinent test results  Airway Mallampati: II  TM Distance: >3 FB Neck ROM: Full    Dental no notable dental hx. (+) Teeth Intact, Dental Advisory Given   Pulmonary asthma , sleep apnea    Pulmonary exam normal breath sounds clear to auscultation       Cardiovascular hypertension, Pt. on home beta blockers and Pt. on medications Normal cardiovascular exam Rhythm:Regular Rate:Normal     Neuro/Psych  PSYCHIATRIC DISORDERS Anxiety     CVA    GI/Hepatic hiatal hernia,GERD  ,,  Endo/Other  diabetes    Renal/GU    Prostate CA    Musculoskeletal  (+) Arthritis ,    Abdominal  (+) + obese (BMI 36.8)  Peds  Hematology   Anesthesia Other Findings All: EMycin  Reproductive/Obstetrics                             Anesthesia Physical Anesthesia Plan  ASA: 3  Anesthesia Plan: MAC   Post-op Pain Management: Ofirmev IV (intra-op)*   Induction: Intravenous  PONV Risk Score and Plan: 3 and Treatment may vary due to age or medical condition, Midazolam and Ondansetron  Airway Management Planned: Nasal Cannula and Natural Airway  Additional Equipment:   Intra-op Plan:   Post-operative Plan:   Informed Consent:      Dental advisory given  Plan Discussed with: CRNA and Anesthesiologist  Anesthesia Plan Comments: (Pt on GLP-1 agonist  Munjaro qWeek last dose 11/13)        Anesthesia Quick Evaluation

## 2022-04-15 NOTE — H&P (Signed)
CC/HPI: Prostate cancer   HPI: [name] is a 66 year old male with T1c, grade 2 prostate cancer. Hx of CVA in 02/2021 (currently on aspirin).   Last PSA: 9.88  Biopsy Date: 09/27/2021  TNM stage: T1c  Gleason score: 3+4 = 7  Left: 2 out of 4 cores from the ROI involving the left anterior peripheral zone of the mid gland came back with grade 2 prostate cancer. An additional core at the lateral apex also had grade 2 prostate cancer  Right: Grade 2 prostate cancer involving the lateral mid gland  Prostate volume: 43 cm  SHIM: 4  AUASS: 17/4   He has done well after the biopsy and denies residual hematuria, dysuria or blood per rectum.   Interval: Today the patient is here for further discussion of radical prostatectomy for his prostate cancer. The patient has met with Dr. Tammi Klippel and discussed radiation. He has not no progression of his lower urinary tract symptoms. He has mild to moderate voiding symptoms and quite significant erectile dysfunction.   The patient lives alone, not married.   The patient has a history of poorly controlled diabetes, hypertension and obesity. He has recovered reasonably well from his stroke in October 2022.     ALLERGIES: Erythromycin Derivatives    MEDICATIONS: Levofloxacin 750 mg tablet 1 tablet PO As Directed Take one hour prior to your scheduled procedure.  Valium 10 mg tablet 2 tablet PO once take one hour prior to procedure  Aspirin Ec 81 mg tablet, delayed release 0 Oral  Carvedilol 25 mg tablet Oral  Chlorthalidone 25 MG Oral Tablet 1 Oral Daily  Jardiance  Lantus Solostar 100 unit/ml (3 ml) insulin pen Subcutaneous  Losartan Potassium  Victoza 2-Pak     GU PSH: Prostate Needle Biopsy - 09/27/2021       PSH Notes: Cath Stent Placement   NON-GU PSH: Surgical Pathology, Gross And Microscopic Examination For Prostate Needle - 09/27/2021     GU PMH: Prostate Cancer - 10/08/2021 Elevated PSA - 09/27/2021, - 07/13/2021, Elevated prostate  specific antigen (PSA), - 2014 Bladder-neck stenosis/contracture, 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, Erectile dysfunction due to arterial insufficiency - 2014 Obstructive and reflux uropathy, Unspec, Obstructive uropathy - 2014 Spermatocele of epididymis, Unspec, Spermatocele - 2014      PMH Notes:  2008-08-23 11:34:23 - Note: Arthritis   NON-GU PMH: Anxiety, Anxiety - 2014 Asthma, Asthma - 2014 Personal history of other diseases of the circulatory system, History of hypertension - 2014, History of congestive heart disease, - 2014 Personal history of other diseases of the digestive system, History of esophageal reflux - 2014 Personal history of other diseases of the nervous system and sense organs, History of sleep apnea - 2014 Personal history of other endocrine, nutritional and metabolic disease, History of hypercholesterolemia - 2014 Personal history of other mental and behavioral disorders, History of depression - 2014 Type 1 diabetes mellitus without complications, Type I Diabetes Mellitus - 2014 Encounter for general adult medical examination without abnormal findings, Encounter for preventive health examination    FAMILY HISTORY: Cancer - Brother Cervical Cancer - Mother Diabetes - Mother Family Health Status - Father alive at age 46 - 55 In Family Family Health Status - Mother's Age - Runs In Family Heart Disease - Uncle Hypertension - Uncle sickle cell anemia - Runs In Family   SOCIAL HISTORY: Marital Status: Single Preferred Language: English; Ethnicity: Not Hispanic Or Latino; Race: Black or African American Current Smoking  Status: Patient has never smoked.   Tobacco Use Assessment Completed: Used Tobacco in last 30 days? Social Drinker.  Drinks 1 caffeinated drink per day.     Notes: Never A Smoker, Occupation:, Caffeine Use, Marital History - Single, Tobacco Use, Alcohol Use   REVIEW OF SYSTEMS:    GU Review Male:   Patient denies  frequent urination, hard to postpone urination, burning/ pain with urination, get up at night to urinate, leakage of urine, stream starts and stops, trouble starting your stream, have to strain to urinate , erection problems, and penile pain.  Gastrointestinal (Upper):   Patient denies nausea, vomiting, and indigestion/ heartburn.  Gastrointestinal (Lower):   Patient denies diarrhea and constipation.  Constitutional:   Patient denies fever, weight loss, night sweats, and fatigue.  Skin:   Patient denies skin rash/ lesion and itching.  Eyes:   Patient denies blurred vision and double vision.  Ears/ Nose/ Throat:   Patient denies sore throat and sinus problems.  Hematologic/Lymphatic:   Patient denies swollen glands and easy bruising.  Cardiovascular:   Patient denies leg swelling and chest pains.  Respiratory:   Patient denies cough and shortness of breath.  Endocrine:   Patient denies excessive thirst.  Musculoskeletal:   Patient denies back pain and joint pain.  Neurological:   Patient denies headaches and dizziness.  Psychologic:   Patient denies depression and anxiety.   VITAL SIGNS: None   Complexity of Data:  Source Of History:  Patient  Lab Test Review:   PSA  Records Review:   AUA Symptom Score, Pathology Reports, Previous Doctor Records, Previous Patient Records, IIEF Score, POC Tool  Urine Test Review:   Urinalysis   07/13/21  PSA  Total PSA 9.88 ng/mL  Free PSA 1.17 ng/mL  % Free PSA 12 % PSA    08/23/08  Hormones  Testosterone, Total 148.0     PROCEDURES:          Urinalysis w/Scope Dipstick Dipstick Cont'd Micro  Color: Yellow Bilirubin: Neg mg/dL WBC/hpf: NS (Not Seen)  Appearance: Clear Ketones: Neg mg/dL RBC/hpf: 0 - 2/hpf  Specific Gravity: 1.015 Blood: Neg ery/uL Bacteria: NS (Not Seen)  pH: 5.5 Protein: 1+ mg/dL Cystals: NS (Not Seen)  Glucose: 2+ mg/dL Urobilinogen: 0.2 mg/dL Casts: NS (Not Seen)    Nitrites: Neg Trichomonas: Not Present    Leukocyte  Esterase: Neg leu/uL Mucous: Not Present      Epithelial Cells: NS (Not Seen)      Yeast: NS (Not Seen)      Sperm: Not Present    ASSESSMENT:      ICD-10 Details  1 GU:   Prostate Cancer - C61      PLAN:           Document Letter(s):  Created for Patient: Clinical Summary         Notes:   I discussed robotic prostatectomy with the patient. We went through the expectations of the surgery, hospitalization, and the postoperative course. We discussed the associated risks and the benefits. I discussed with him the major side effects of erectile dysfunction and urinary incontinence. I went through everything with him in significant detail.   The patient is still contemplating his decision as to how he would like to proceed. He will contact us if he would like to to have surgery for his prostate cancer. He does have some significant comorbidities including poorly controlled diabetes. I recommended that he continue to work aggressively on that. He will  follow-up with [place] once he has made his decision.

## 2022-04-16 ENCOUNTER — Encounter (HOSPITAL_BASED_OUTPATIENT_CLINIC_OR_DEPARTMENT_OTHER): Payer: Self-pay | Admitting: Urology

## 2022-04-16 ENCOUNTER — Encounter (HOSPITAL_BASED_OUTPATIENT_CLINIC_OR_DEPARTMENT_OTHER)
Admission: RE | Disposition: A | Discharge status: Home / Self Care | Payer: Self-pay | Source: Home / Self Care | Attending: Urology

## 2022-04-16 ENCOUNTER — Other Ambulatory Visit: Payer: Self-pay

## 2022-04-16 ENCOUNTER — Ambulatory Visit (HOSPITAL_BASED_OUTPATIENT_CLINIC_OR_DEPARTMENT_OTHER): Payer: Medicare Other | Admitting: Anesthesiology

## 2022-04-16 ENCOUNTER — Ambulatory Visit (HOSPITAL_BASED_OUTPATIENT_CLINIC_OR_DEPARTMENT_OTHER)
Admission: RE | Admit: 2022-04-16 | Discharge: 2022-04-16 | Disposition: A | Discharge status: Home / Self Care | Payer: Medicare Other | Attending: Urology | Admitting: Urology

## 2022-04-16 DIAGNOSIS — Z8249 Family history of ischemic heart disease and other diseases of the circulatory system: Secondary | ICD-10-CM | POA: Diagnosis not present

## 2022-04-16 DIAGNOSIS — N529 Male erectile dysfunction, unspecified: Secondary | ICD-10-CM | POA: Diagnosis not present

## 2022-04-16 DIAGNOSIS — C61 Malignant neoplasm of prostate: Secondary | ICD-10-CM

## 2022-04-16 DIAGNOSIS — Z7984 Long term (current) use of oral hypoglycemic drugs: Secondary | ICD-10-CM

## 2022-04-16 DIAGNOSIS — Z01818 Encounter for other preprocedural examination: Secondary | ICD-10-CM

## 2022-04-16 DIAGNOSIS — I10 Essential (primary) hypertension: Secondary | ICD-10-CM

## 2022-04-16 DIAGNOSIS — I110 Hypertensive heart disease with heart failure: Secondary | ICD-10-CM | POA: Insufficient documentation

## 2022-04-16 DIAGNOSIS — E119 Type 2 diabetes mellitus without complications: Secondary | ICD-10-CM | POA: Diagnosis not present

## 2022-04-16 DIAGNOSIS — F419 Anxiety disorder, unspecified: Secondary | ICD-10-CM | POA: Diagnosis not present

## 2022-04-16 DIAGNOSIS — I509 Heart failure, unspecified: Secondary | ICD-10-CM | POA: Diagnosis not present

## 2022-04-16 DIAGNOSIS — E1065 Type 1 diabetes mellitus with hyperglycemia: Secondary | ICD-10-CM | POA: Diagnosis not present

## 2022-04-16 HISTORY — PX: GOLD SEED IMPLANT: SHX6343

## 2022-04-16 HISTORY — DX: Personal history of other diseases of the digestive system: Z87.19

## 2022-04-16 HISTORY — DX: Obstructive sleep apnea (adult) (pediatric): G47.33

## 2022-04-16 LAB — POCT I-STAT, CHEM 8
BUN: 26 mg/dL — ABNORMAL HIGH (ref 8–23)
Calcium, Ion: 1.2 mmol/L (ref 1.15–1.40)
Chloride: 102 mmol/L (ref 98–111)
Creatinine, Ser: 1.2 mg/dL (ref 0.61–1.24)
Glucose, Bld: 244 mg/dL — ABNORMAL HIGH (ref 70–99)
HCT: 47 % (ref 39.0–52.0)
Hemoglobin: 16 g/dL (ref 13.0–17.0)
Potassium: 3.6 mmol/L (ref 3.5–5.1)
Sodium: 141 mmol/L (ref 135–145)
TCO2: 26 mmol/L (ref 22–32)

## 2022-04-16 LAB — GLUCOSE, CAPILLARY: Glucose-Capillary: 201 mg/dL — ABNORMAL HIGH (ref 70–99)

## 2022-04-16 SURGERY — INSERTION, GOLD SEEDS
Anesthesia: Monitor Anesthesia Care

## 2022-04-16 MED ORDER — KETAMINE HCL 10 MG/ML IJ SOLN
INTRAMUSCULAR | Status: DC | PRN
  Administered 2022-04-16: 50 mg via INTRAVENOUS

## 2022-04-16 MED ORDER — ONDANSETRON HCL 4 MG/2ML IJ SOLN
INTRAMUSCULAR | Status: DC | PRN
  Administered 2022-04-16: 4 mg via INTRAVENOUS

## 2022-04-16 MED ORDER — ONDANSETRON HCL 4 MG/2ML IJ SOLN
INTRAMUSCULAR | Status: AC
  Filled 2022-04-16: qty 2

## 2022-04-16 MED ORDER — FENTANYL CITRATE (PF) 100 MCG/2ML IJ SOLN: 25.0000 ug | INTRAMUSCULAR | Status: DC | PRN

## 2022-04-16 MED ORDER — DEXAMETHASONE SODIUM PHOSPHATE 10 MG/ML IJ SOLN
INTRAMUSCULAR | Status: AC
  Filled 2022-04-16: qty 1

## 2022-04-16 MED ORDER — BUPIVACAINE HCL 0.25 % IJ SOLN
INTRAMUSCULAR | Status: DC | PRN
  Administered 2022-04-16: 10 mL

## 2022-04-16 MED ORDER — KETAMINE HCL 50 MG/5ML IJ SOSY
PREFILLED_SYRINGE | INTRAMUSCULAR | Status: AC
  Filled 2022-04-16: qty 5

## 2022-04-16 MED ORDER — MIDAZOLAM HCL 5 MG/5ML IJ SOLN
INTRAMUSCULAR | Status: DC | PRN
  Administered 2022-04-16: 1 mg via INTRAVENOUS

## 2022-04-16 MED ORDER — PROPOFOL 10 MG/ML IV BOLUS
INTRAVENOUS | Status: AC
  Filled 2022-04-16: qty 20

## 2022-04-16 MED ORDER — LIDOCAINE HCL (PF) 2 % IJ SOLN
INTRAMUSCULAR | Status: AC
  Filled 2022-04-16: qty 5

## 2022-04-16 MED ORDER — PROPOFOL 500 MG/50ML IV EMUL
INTRAVENOUS | Status: AC
  Filled 2022-04-16: qty 50

## 2022-04-16 MED ORDER — SODIUM CHLORIDE 0.9% FLUSH
INTRAVENOUS | Status: DC | PRN
  Administered 2022-04-16 (×3): 10 mL

## 2022-04-16 MED ORDER — FENTANYL CITRATE (PF) 100 MCG/2ML IJ SOLN
INTRAMUSCULAR | Status: AC
  Filled 2022-04-16: qty 2

## 2022-04-16 MED ORDER — LACTATED RINGERS IV SOLN: INTRAVENOUS | Status: DC

## 2022-04-16 MED ORDER — FENTANYL CITRATE (PF) 100 MCG/2ML IJ SOLN
INTRAMUSCULAR | Status: DC | PRN
  Administered 2022-04-16: 25 ug via INTRAVENOUS
  Administered 2022-04-16: 50 ug via INTRAVENOUS

## 2022-04-16 MED ORDER — MIDAZOLAM HCL 2 MG/2ML IJ SOLN
INTRAMUSCULAR | Status: AC
  Filled 2022-04-16: qty 2

## 2022-04-16 MED ORDER — PROPOFOL 10 MG/ML IV BOLUS
INTRAVENOUS | Status: DC | PRN
  Administered 2022-04-16: 30 mg via INTRAVENOUS

## 2022-04-16 MED ORDER — FLEET ENEMA 7-19 GM/118ML RE ENEM: 1.0000 | ENEMA | Freq: Once | RECTAL | Status: DC

## 2022-04-16 MED ORDER — CEFAZOLIN SODIUM-DEXTROSE 2-4 GM/100ML-% IV SOLN
INTRAVENOUS | Status: AC
  Filled 2022-04-16: qty 100

## 2022-04-16 MED ORDER — ONDANSETRON HCL 4 MG/2ML IJ SOLN: 4.0000 mg | Freq: Once | INTRAMUSCULAR | Status: DC | PRN

## 2022-04-16 MED ORDER — PROPOFOL 500 MG/50ML IV EMUL
INTRAVENOUS | Status: DC | PRN
  Administered 2022-04-16: 200 ug/kg/min via INTRAVENOUS

## 2022-04-16 MED ORDER — ACETAMINOPHEN 10 MG/ML IV SOLN: 1000.0000 mg | Freq: Once | INTRAVENOUS | Status: DC | PRN

## 2022-04-16 MED ORDER — CEFAZOLIN SODIUM-DEXTROSE 2-4 GM/100ML-% IV SOLN
2.0000 g | Freq: Once | INTRAVENOUS | Status: AC
  Administered 2022-04-16: 2 g via INTRAVENOUS

## 2022-04-16 SURGICAL SUPPLY — 25 items
BLADE CLIPPER SENSICLIP SURGIC (BLADE) ×1 IMPLANT
CNTNR URN SCR LID CUP LEK RST (MISCELLANEOUS) ×1 IMPLANT
CONT SPEC 4OZ STRL OR WHT (MISCELLANEOUS) ×3
COVER BACK TABLE 60X90IN (DRAPES) ×1 IMPLANT
DRSG TEGADERM 4X4.75 (GAUZE/BANDAGES/DRESSINGS) ×1 IMPLANT
DRSG TEGADERM 8X12 (GAUZE/BANDAGES/DRESSINGS) ×1 IMPLANT
GAUZE SPONGE 4X4 12PLY STRL (GAUZE/BANDAGES/DRESSINGS) ×1 IMPLANT
GLOVE BIO SURGEON STRL SZ 6.5 (GLOVE) ×1 IMPLANT
GLOVE BIO SURGEON STRL SZ7.5 (GLOVE) ×1 IMPLANT
GLOVE ECLIPSE 8.0 STRL XLNG CF (GLOVE) ×1 IMPLANT
IMPL SPACEOAR VUE SYSTEM (Spacer) ×1 IMPLANT
IMPLANT SPACEOAR VUE SYSTEM (Spacer) IMPLANT
KIT TURNOVER CYSTO (KITS) ×1 IMPLANT
MARKER GOLD PRELOAD 1.2X3 (Urological Implant) ×1 IMPLANT
MARKER SKIN DUAL TIP RULER LAB (MISCELLANEOUS) ×1 IMPLANT
NDL SPNL 22GX3.5 QUINCKE BK (NEEDLE) ×1 IMPLANT
NEEDLE SPNL 22GX3.5 QUINCKE BK (NEEDLE) ×1 IMPLANT
SEED GOLD PRELOAD 1.2X3 (Urological Implant) ×1 IMPLANT
SHEATH ULTRASOUND LF (SHEATH) IMPLANT
SHEATH ULTRASOUND LTX NONSTRL (SHEATH) IMPLANT
SURGILUBE 2OZ TUBE FLIPTOP (MISCELLANEOUS) ×1 IMPLANT
SYR 10ML LL (SYRINGE) IMPLANT
SYR CONTROL 10ML LL (SYRINGE) ×1 IMPLANT
TOWEL OR 17X26 10 PK STRL BLUE (TOWEL DISPOSABLE) ×1 IMPLANT
UNDERPAD 30X36 HEAVY ABSORB (UNDERPADS AND DIAPERS) ×1 IMPLANT

## 2022-04-16 NOTE — Transfer of Care (Signed)
Immediate Anesthesia Transfer of Care Note  Patient: Thomas Gardner  Procedure(s) Performed: GOLD SEED IMPLANT  Patient Location: PACU  Anesthesia Type:MAC  Level of Consciousness: drowsy, patient cooperative, and responds to stimulation  Airway & Oxygen Therapy: Patient Spontanous Breathing and Patient connected to face mask oxygen  Post-op Assessment: Report given to RN and Post -op Vital signs reviewed and stable  Post vital signs: Reviewed and stable  Last Vitals:  Vitals Value Taken Time  BP 127/69 04/16/22 0822  Temp    Pulse 73 04/16/22 0824  Resp 18 04/16/22 0824  SpO2 100 % 04/16/22 0824  Vitals shown include unvalidated device data.  Last Pain:  Vitals:   04/16/22 0610  TempSrc: Oral  PainSc: 0-No pain      Patients Stated Pain Goal: 5 (66/29/47 6546)  Complications: No notable events documented.

## 2022-04-16 NOTE — Anesthesia Procedure Notes (Addendum)
Procedure Name: MAC Date/Time: 04/16/2022 7:34 AM  Performed by: Rogers Blocker, CRNAPre-anesthesia Checklist: Patient identified, Emergency Drugs available, Suction available and Patient being monitored Patient Re-evaluated:Patient Re-evaluated prior to induction Oxygen Delivery Method: Simple face mask Preoxygenation: Pre-oxygenation with 100% oxygen Ventilation: Oral airway inserted - appropriate to patient size Placement Confirmation: positive ETCO2 Dental Injury: Teeth and Oropharynx as per pre-operative assessment

## 2022-04-16 NOTE — Op Note (Signed)
Preoperative diagnosis: Clinically localized adenocarcinoma of the prostate   Postoperative diagnosis: Clinically localized adenocarcinoma of the prostate  Procedure: 1) Placement of fiducial markers into prostate                 Surgeon: Jacalyn Lefevre, MD   Anesthesia: General  EBL: Minimal  Complications: None  Indication: Thomas Gardner is a 66 y.o. gentleman with clinically localized prostate cancer. After discussing management options for treatment, he elected to proceed with radiotherapy. He presents today for the above procedures. The potential risks, complications, alternative options, and expected recovery course have been discussed in detail with the patient and he has provided informed consent to proceed.  Description of procedure: The patient was administered preoperative antibiotics, placed in the dorsal lithotomy position, and prepped and draped in the usual sterile fashion. Next, transrectal ultrasonography was utilized to visualize the prostate. Three gold fiducial markers were then placed into the prostate via transperineal needles under ultrasound guidance at the right apex, right base, and left mid gland under direct ultrasound guidance. A site in the midline was then selected on the perineum for placement of an 18 g needle with saline. The needle was advanced above the rectum and below Denonvillier's fascia to the mid gland and confirmed to be in the midline on transverse imaging. Multiple attempts were made at accessing this space however resistance was met when injecting the saline.  Attempts are repositioning were not successful.  Given the resistance met and possible risks if Space OAR was placed in the wrong location, the decision was made not to inject the Space OAR.  He tolerated the procedure well and without complications. He was given a voiding trial prior to discharge from the PACU.

## 2022-04-16 NOTE — Anesthesia Postprocedure Evaluation (Signed)
Anesthesia Post Note  Patient: Thomas Gardner  Procedure(s) Performed: GOLD SEED IMPLANT     Patient location during evaluation: PACU Anesthesia Type: MAC Level of consciousness: awake and alert Pain management: pain level controlled Vital Signs Assessment: post-procedure vital signs reviewed and stable Respiratory status: spontaneous breathing, nonlabored ventilation, respiratory function stable and patient connected to nasal cannula oxygen Cardiovascular status: stable and blood pressure returned to baseline Postop Assessment: no apparent nausea or vomiting Anesthetic complications: no  No notable events documented.  Last Vitals:  Vitals:   04/16/22 0900 04/16/22 0945  BP: (!) 162/77 (!) 179/75  Pulse: 66 71  Resp: 16 15  Temp:  (!) 36.3 C  SpO2: 96% 100%    Last Pain:  Vitals:   04/16/22 0822  TempSrc:   PainSc: [name]

## 2022-04-16 NOTE — Discharge Instructions (Addendum)
DISCHARGE INSTRUCTIONS FOR PROSTATE GOLD MARKERS   Antibiotics You may be given a prescription for an antibiotic to take when you arrive home. If so, be sure to take every tablet in the bottle, even if you are feeling better before the prescription is finished. If you begin itching, notice a rash or start to swell on your trunk, arms, legs and/or throat, immediately stop taking the antibiotic and call your Urologist.  Diet Resume your usual diet when you return home. To keep your bowels moving easily and softly, drink prune, apple and cranberry juice at room temperature. You may also take a stool softener, such as Colace, which is available without prescription at local pharmacies.  Daily activities No driving or heavy lifting for at least two days after the implant. No bike riding, horseback riding or riding lawn mowers for the first month after the implant. Any strenuous physical activity should be approved by your doctor before you resume it.  Sexual relations You may resume sexual relations two weeks after the procedure. Your semen may be dark brown or black; this is normal and is related bleeding that may have occurred during the implant.  Postoperative swelling Expect swelling and bruising of the scrotum and perineum (the area between the scrotum and anus). Both the swelling and the bruising should resolve in l or 2 weeks. Ice packs and over- the-counter medications such as Tylenol, Advil or Aleve may lessen your discomfort. Postoperative urination Most men experience burning on urination and/or urinary frequency. If this becomes bothersome, contact your Urologist.  Medication can be prescribed to relieve these problems.  It is normal to have some blood in your urine for a few days after the implant.   Contact your doctor for Temperature greater than 101 F Increasing pain Inability to urinate  Follow-up  You should have follow up with your radiation oncologist  in next few weeks and  with your Urologist in 3 months     Post Anesthesia Home Care Instructions  Activity: Get plenty of rest for the remainder of the day. A responsible individual must stay with you for 24 hours following the procedure.  For the next 24 hours, DO NOT: -Drive a car -Paediatric nurse -Drink alcoholic beverages -Take any medication unless instructed by your physician -Make any legal decisions or sign important papers.  Meals: Start with liquid foods such as gelatin or soup. Progress to regular foods as tolerated. Avoid greasy, spicy, heavy foods. If nausea and/or vomiting occur, drink only clear liquids until the nausea and/or vomiting subsides. Call your physician if vomiting continues.  Special Instructions/Symptoms: Your throat may feel dry or sore from the anesthesia or the breathing tube placed in your throat during surgery. If this causes discomfort, gargle with warm salt water. The discomfort should disappear within 24 hours.  Marland Kitchen

## 2022-04-17 LAB — COLOGUARD: COLOGUARD: NEGATIVE

## 2022-04-18 ENCOUNTER — Encounter (HOSPITAL_BASED_OUTPATIENT_CLINIC_OR_DEPARTMENT_OTHER): Payer: Self-pay | Admitting: Urology

## 2022-04-19 ENCOUNTER — Ambulatory Visit: Payer: Medicare Other | Admitting: Radiation Oncology

## 2022-04-24 ENCOUNTER — Telehealth: Payer: Self-pay | Admitting: *Deleted

## 2022-04-24 NOTE — Telephone Encounter (Signed)
Called patient to remind of sim appt. for 04-25-22- arrival time- 12:45 pm @ [facility], informed patient to arrive with a full bladder, spoke with patient and he is aware of this appt. and the instructions

## 2022-04-24 NOTE — Telephone Encounter (Signed)
xxxx 

## 2022-04-24 NOTE — Progress Notes (Signed)
  Radiation Oncology         [phone] ________________________________  Name: Thomas Gardner MRN: [id number]  Date: 04/25/2022  DOB: January 23, 1956  SIMULATION AND TREATMENT PLANNING NOTE    ICD-10-CM   1. Malignant neoplasm of prostate ([facility])  C61       DIAGNOSIS:  66 y.o. gentleman with Stage T1c adenocarcinoma of the prostate with Gleason score of 3+4, and PSA of 9.88.   NARRATIVE:  The patient was brought to the [facility].  Identity was confirmed.  All relevant records and images related to the planned course of therapy were reviewed.  The patient freely provided informed written consent to proceed with treatment after reviewing the details related to the planned course of therapy. The consent form was witnessed and verified by the simulation staff.  Then, the patient was set-up in a stable reproducible supine position for radiation therapy.  A vacuum lock pillow device was custom fabricated to position his legs in a reproducible immobilized position.  Then, I performed a urethrogram under sterile conditions to identify the prostatic apex.  CT images were obtained.  Surface markings were placed.  The CT images were loaded into the planning software.  Then the prostate target and avoidance structures including the rectum, bladder, bowel and hips were contoured.  Treatment planning then occurred.  The radiation prescription was entered and confirmed.  A total of one complex treatment devices was fabricated. I have requested : Intensity Modulated Radiotherapy (IMRT) is medically necessary for this case for the following reason:  Rectal sparing.Marland Kitchen  PLAN:  The patient will receive 70 Gy in 28 fractions.  ________________________________  Sheral Apley Tammi Klippel, M.D.

## 2022-04-25 ENCOUNTER — Other Ambulatory Visit: Payer: Self-pay

## 2022-04-25 ENCOUNTER — Ambulatory Visit
Admission: RE | Admit: 2022-04-25 | Discharge: 2022-04-25 | Disposition: A | Discharge status: Home / Self Care | Payer: Medicare Other | Source: Ambulatory Visit | Attending: Radiation Oncology | Admitting: Radiation Oncology

## 2022-04-25 DIAGNOSIS — C61 Malignant neoplasm of prostate: Secondary | ICD-10-CM | POA: Diagnosis present

## 2022-04-25 DIAGNOSIS — Z510 Encounter for antineoplastic radiation therapy: Secondary | ICD-10-CM | POA: Diagnosis not present

## 2022-04-30 DIAGNOSIS — Z510 Encounter for antineoplastic radiation therapy: Secondary | ICD-10-CM | POA: Diagnosis not present

## 2022-05-01 ENCOUNTER — Other Ambulatory Visit (HOSPITAL_COMMUNITY): Payer: Self-pay

## 2022-05-01 MED ORDER — MOUNJARO 2.5 MG/0.5ML ~~LOC~~ SOAJ
2.5000 mg | SUBCUTANEOUS | 0 refills | Status: DC
  Filled 2022-05-01: qty 2, 28d supply, fill #0

## 2022-05-01 MED ORDER — AMOXICILLIN-POT CLAVULANATE 875-125 MG PO TABS
1.0000 | ORAL_TABLET | Freq: Two times a day (BID) | ORAL | 0 refills | Status: DC
  Filled 2022-05-01: qty 20, 10d supply, fill #0

## 2022-05-06 ENCOUNTER — Ambulatory Visit
Admission: RE | Admit: 2022-05-06 | Discharge: 2022-05-06 | Disposition: A | Discharge status: Home / Self Care | Payer: Medicare Other | Source: Ambulatory Visit | Attending: Radiation Oncology | Admitting: Radiation Oncology

## 2022-05-06 ENCOUNTER — Other Ambulatory Visit: Payer: Self-pay

## 2022-05-06 DIAGNOSIS — Z510 Encounter for antineoplastic radiation therapy: Secondary | ICD-10-CM | POA: Diagnosis not present

## 2022-05-06 LAB — RAD ONC ARIA SESSION SUMMARY
Course Elapsed Days: 0
Plan Fractions Treated to Date: 1
Plan Prescribed Dose Per Fraction: 2.5 Gy
Plan Total Fractions Prescribed: 28
Plan Total Prescribed Dose: 70 Gy
Reference Point Dosage Given to Date: 2.5 Gy
Reference Point Session Dosage Given: 2.5 Gy
Session Number: 1

## 2022-05-07 ENCOUNTER — Other Ambulatory Visit: Payer: Self-pay

## 2022-05-07 ENCOUNTER — Ambulatory Visit
Admission: RE | Admit: 2022-05-07 | Discharge: 2022-05-07 | Disposition: A | Discharge status: Home / Self Care | Payer: Medicare Other | Source: Ambulatory Visit | Attending: Radiation Oncology | Admitting: Radiation Oncology

## 2022-05-07 DIAGNOSIS — Z510 Encounter for antineoplastic radiation therapy: Secondary | ICD-10-CM | POA: Diagnosis not present

## 2022-05-07 LAB — RAD ONC ARIA SESSION SUMMARY
Course Elapsed Days: 1
Plan Fractions Treated to Date: 2
Plan Prescribed Dose Per Fraction: 2.5 Gy
Plan Total Fractions Prescribed: 28
Plan Total Prescribed Dose: 70 Gy
Reference Point Dosage Given to Date: 5 Gy
Reference Point Session Dosage Given: 2.5 Gy
Session Number: 2

## 2022-05-08 ENCOUNTER — Other Ambulatory Visit: Payer: Self-pay

## 2022-05-08 ENCOUNTER — Ambulatory Visit
Admission: RE | Admit: 2022-05-08 | Discharge: 2022-05-08 | Disposition: A | Discharge status: Home / Self Care | Payer: Medicare Other | Source: Ambulatory Visit | Attending: Radiation Oncology | Admitting: Radiation Oncology

## 2022-05-08 DIAGNOSIS — Z510 Encounter for antineoplastic radiation therapy: Secondary | ICD-10-CM | POA: Diagnosis not present

## 2022-05-08 LAB — RAD ONC ARIA SESSION SUMMARY
Course Elapsed Days: 2
Plan Fractions Treated to Date: 3
Plan Prescribed Dose Per Fraction: 2.5 Gy
Plan Total Fractions Prescribed: 28
Plan Total Prescribed Dose: 70 Gy
Reference Point Dosage Given to Date: 7.5 Gy
Reference Point Session Dosage Given: 2.5 Gy
Session Number: 3

## 2022-05-09 ENCOUNTER — Ambulatory Visit
Admission: RE | Admit: 2022-05-09 | Discharge: 2022-05-09 | Disposition: A | Discharge status: Home / Self Care | Payer: Medicare Other | Source: Ambulatory Visit | Attending: Radiation Oncology | Admitting: Radiation Oncology

## 2022-05-09 ENCOUNTER — Other Ambulatory Visit: Payer: Self-pay

## 2022-05-09 DIAGNOSIS — Z510 Encounter for antineoplastic radiation therapy: Secondary | ICD-10-CM | POA: Diagnosis not present

## 2022-05-09 LAB — RAD ONC ARIA SESSION SUMMARY
Course Elapsed Days: 3
Plan Fractions Treated to Date: 4
Plan Prescribed Dose Per Fraction: 2.5 Gy
Plan Total Fractions Prescribed: 28
Plan Total Prescribed Dose: 70 Gy
Reference Point Dosage Given to Date: 10 Gy
Reference Point Session Dosage Given: 2.5 Gy
Session Number: 4

## 2022-05-10 ENCOUNTER — Other Ambulatory Visit: Payer: Self-pay

## 2022-05-10 ENCOUNTER — Ambulatory Visit
Admission: RE | Admit: 2022-05-10 | Discharge: 2022-05-10 | Disposition: A | Discharge status: Home / Self Care | Payer: Medicare Other | Source: Ambulatory Visit | Attending: Radiation Oncology | Admitting: Radiation Oncology

## 2022-05-10 DIAGNOSIS — Z510 Encounter for antineoplastic radiation therapy: Secondary | ICD-10-CM | POA: Diagnosis not present

## 2022-05-10 LAB — RAD ONC ARIA SESSION SUMMARY
Course Elapsed Days: 4
Plan Fractions Treated to Date: 5
Plan Prescribed Dose Per Fraction: 2.5 Gy
Plan Total Fractions Prescribed: 28
Plan Total Prescribed Dose: 70 Gy
Reference Point Dosage Given to Date: 12.5 Gy
Reference Point Session Dosage Given: 2.5 Gy
Session Number: 5

## 2022-05-14 ENCOUNTER — Other Ambulatory Visit: Payer: Self-pay

## 2022-05-14 ENCOUNTER — Other Ambulatory Visit (HOSPITAL_COMMUNITY): Payer: Self-pay

## 2022-05-14 ENCOUNTER — Ambulatory Visit
Admission: RE | Admit: 2022-05-14 | Discharge: 2022-05-14 | Disposition: A | Discharge status: Home / Self Care | Payer: Medicare Other | Source: Ambulatory Visit | Attending: Radiation Oncology | Admitting: Radiation Oncology

## 2022-05-14 DIAGNOSIS — Z510 Encounter for antineoplastic radiation therapy: Secondary | ICD-10-CM | POA: Diagnosis not present

## 2022-05-14 LAB — RAD ONC ARIA SESSION SUMMARY
Course Elapsed Days: 8
Plan Fractions Treated to Date: 6
Plan Prescribed Dose Per Fraction: 2.5 Gy
Plan Total Fractions Prescribed: 28
Plan Total Prescribed Dose: 70 Gy
Reference Point Dosage Given to Date: 15 Gy
Reference Point Session Dosage Given: 2.5 Gy
Session Number: 6

## 2022-05-15 ENCOUNTER — Ambulatory Visit
Admission: RE | Admit: 2022-05-15 | Discharge: 2022-05-15 | Disposition: A | Discharge status: Home / Self Care | Payer: Medicare Other | Source: Ambulatory Visit | Attending: Radiation Oncology | Admitting: Radiation Oncology

## 2022-05-15 ENCOUNTER — Other Ambulatory Visit: Payer: Self-pay

## 2022-05-15 DIAGNOSIS — Z510 Encounter for antineoplastic radiation therapy: Secondary | ICD-10-CM | POA: Diagnosis not present

## 2022-05-15 LAB — RAD ONC ARIA SESSION SUMMARY
Course Elapsed Days: 9
Plan Fractions Treated to Date: 7
Plan Prescribed Dose Per Fraction: 2.5 Gy
Plan Total Fractions Prescribed: 28
Plan Total Prescribed Dose: 70 Gy
Reference Point Dosage Given to Date: 17.5 Gy
Reference Point Session Dosage Given: 2.5 Gy
Session Number: 7

## 2022-05-16 ENCOUNTER — Ambulatory Visit
Admission: RE | Admit: 2022-05-16 | Discharge: 2022-05-16 | Disposition: A | Discharge status: Home / Self Care | Payer: Medicare Other | Source: Ambulatory Visit | Attending: Radiation Oncology | Admitting: Radiation Oncology

## 2022-05-16 ENCOUNTER — Other Ambulatory Visit: Payer: Self-pay

## 2022-05-16 DIAGNOSIS — Z510 Encounter for antineoplastic radiation therapy: Secondary | ICD-10-CM | POA: Diagnosis not present

## 2022-05-16 LAB — RAD ONC ARIA SESSION SUMMARY
Course Elapsed Days: 10
Plan Fractions Treated to Date: 8
Plan Prescribed Dose Per Fraction: 2.5 Gy
Plan Total Fractions Prescribed: 28
Plan Total Prescribed Dose: 70 Gy
Reference Point Dosage Given to Date: 20 Gy
Reference Point Session Dosage Given: 2.5 Gy
Session Number: 8

## 2022-05-17 ENCOUNTER — Ambulatory Visit
Admission: RE | Admit: 2022-05-17 | Discharge: 2022-05-17 | Disposition: A | Discharge status: Home / Self Care | Payer: Medicare Other | Source: Ambulatory Visit | Attending: Radiation Oncology | Admitting: Radiation Oncology

## 2022-05-17 ENCOUNTER — Other Ambulatory Visit: Payer: Self-pay

## 2022-05-17 DIAGNOSIS — Z510 Encounter for antineoplastic radiation therapy: Secondary | ICD-10-CM | POA: Diagnosis not present

## 2022-05-17 LAB — RAD ONC ARIA SESSION SUMMARY
Course Elapsed Days: 11
Plan Fractions Treated to Date: 9
Plan Prescribed Dose Per Fraction: 2.5 Gy
Plan Total Fractions Prescribed: 28
Plan Total Prescribed Dose: 70 Gy
Reference Point Dosage Given to Date: 22.5 Gy
Reference Point Session Dosage Given: 2.5 Gy
Session Number: 9

## 2022-05-21 ENCOUNTER — Other Ambulatory Visit: Payer: Self-pay

## 2022-05-21 ENCOUNTER — Ambulatory Visit
Admission: RE | Admit: 2022-05-21 | Discharge: 2022-05-21 | Disposition: A | Discharge status: Home / Self Care | Payer: Medicare PPO | Source: Ambulatory Visit | Attending: Radiation Oncology | Admitting: Radiation Oncology

## 2022-05-21 DIAGNOSIS — C61 Malignant neoplasm of prostate: Secondary | ICD-10-CM | POA: Insufficient documentation

## 2022-05-21 DIAGNOSIS — Z510 Encounter for antineoplastic radiation therapy: Secondary | ICD-10-CM | POA: Diagnosis not present

## 2022-05-21 LAB — RAD ONC ARIA SESSION SUMMARY
Course Elapsed Days: 15
Plan Fractions Treated to Date: 10
Plan Prescribed Dose Per Fraction: 2.5 Gy
Plan Total Fractions Prescribed: 28
Plan Total Prescribed Dose: 70 Gy
Reference Point Dosage Given to Date: 25 Gy
Reference Point Session Dosage Given: 2.5 Gy
Session Number: 10

## 2022-05-22 ENCOUNTER — Other Ambulatory Visit: Payer: Self-pay

## 2022-05-22 ENCOUNTER — Ambulatory Visit
Admission: RE | Admit: 2022-05-22 | Discharge: 2022-05-22 | Disposition: A | Discharge status: Home / Self Care | Payer: Medicare PPO | Source: Ambulatory Visit | Attending: Radiation Oncology | Admitting: Radiation Oncology

## 2022-05-22 DIAGNOSIS — Z510 Encounter for antineoplastic radiation therapy: Secondary | ICD-10-CM | POA: Diagnosis not present

## 2022-05-22 LAB — RAD ONC ARIA SESSION SUMMARY
Course Elapsed Days: 16
Plan Fractions Treated to Date: 11
Plan Prescribed Dose Per Fraction: 2.5 Gy
Plan Total Fractions Prescribed: 28
Plan Total Prescribed Dose: 70 Gy
Reference Point Dosage Given to Date: 27.5 Gy
Reference Point Session Dosage Given: 2.5 Gy
Session Number: 11

## 2022-05-23 ENCOUNTER — Ambulatory Visit
Admission: RE | Admit: 2022-05-23 | Discharge: 2022-05-23 | Disposition: A | Discharge status: Home / Self Care | Payer: Medicare PPO | Source: Ambulatory Visit | Attending: Radiation Oncology | Admitting: Radiation Oncology

## 2022-05-23 ENCOUNTER — Other Ambulatory Visit: Payer: Self-pay

## 2022-05-23 DIAGNOSIS — Z510 Encounter for antineoplastic radiation therapy: Secondary | ICD-10-CM | POA: Diagnosis not present

## 2022-05-23 LAB — RAD ONC ARIA SESSION SUMMARY
Course Elapsed Days: 17
Plan Fractions Treated to Date: 12
Plan Prescribed Dose Per Fraction: 2.5 Gy
Plan Total Fractions Prescribed: 28
Plan Total Prescribed Dose: 70 Gy
Reference Point Dosage Given to Date: 30 Gy
Reference Point Session Dosage Given: 2.5 Gy
Session Number: 12

## 2022-05-24 ENCOUNTER — Other Ambulatory Visit: Payer: Self-pay

## 2022-05-24 ENCOUNTER — Ambulatory Visit
Admission: RE | Admit: 2022-05-24 | Discharge: 2022-05-24 | Disposition: A | Discharge status: Home / Self Care | Payer: Medicare PPO | Source: Ambulatory Visit | Attending: Radiation Oncology | Admitting: Radiation Oncology

## 2022-05-24 DIAGNOSIS — Z510 Encounter for antineoplastic radiation therapy: Secondary | ICD-10-CM | POA: Diagnosis not present

## 2022-05-24 LAB — RAD ONC ARIA SESSION SUMMARY
Course Elapsed Days: 18
Plan Fractions Treated to Date: 13
Plan Prescribed Dose Per Fraction: 2.5 Gy
Plan Total Fractions Prescribed: 28
Plan Total Prescribed Dose: 70 Gy
Reference Point Dosage Given to Date: 32.5 Gy
Reference Point Session Dosage Given: 2.5 Gy
Session Number: 13

## 2022-05-27 ENCOUNTER — Ambulatory Visit
Admission: RE | Admit: 2022-05-27 | Discharge: 2022-05-27 | Disposition: A | Discharge status: Home / Self Care | Payer: Medicare PPO | Source: Ambulatory Visit | Attending: Radiation Oncology | Admitting: Radiation Oncology

## 2022-05-27 ENCOUNTER — Other Ambulatory Visit: Payer: Self-pay

## 2022-05-27 DIAGNOSIS — Z510 Encounter for antineoplastic radiation therapy: Secondary | ICD-10-CM | POA: Diagnosis not present

## 2022-05-27 LAB — RAD ONC ARIA SESSION SUMMARY
Course Elapsed Days: 21
Plan Fractions Treated to Date: 14
Plan Prescribed Dose Per Fraction: 2.5 Gy
Plan Total Fractions Prescribed: 28
Plan Total Prescribed Dose: 70 Gy
Reference Point Dosage Given to Date: 35 Gy
Reference Point Session Dosage Given: 2.5 Gy
Session Number: 14

## 2022-05-28 ENCOUNTER — Other Ambulatory Visit: Payer: Self-pay

## 2022-05-28 ENCOUNTER — Ambulatory Visit
Admission: RE | Admit: 2022-05-28 | Discharge: 2022-05-28 | Disposition: A | Discharge status: Home / Self Care | Payer: Medicare PPO | Source: Ambulatory Visit | Attending: Radiation Oncology | Admitting: Radiation Oncology

## 2022-05-28 DIAGNOSIS — Z510 Encounter for antineoplastic radiation therapy: Secondary | ICD-10-CM | POA: Diagnosis not present

## 2022-05-28 LAB — RAD ONC ARIA SESSION SUMMARY
Course Elapsed Days: 22
Plan Fractions Treated to Date: 15
Plan Prescribed Dose Per Fraction: 2.5 Gy
Plan Total Fractions Prescribed: 28
Plan Total Prescribed Dose: 70 Gy
Reference Point Dosage Given to Date: 37.5 Gy
Reference Point Session Dosage Given: 2.5 Gy
Session Number: 15

## 2022-05-29 ENCOUNTER — Other Ambulatory Visit: Payer: Self-pay

## 2022-05-29 ENCOUNTER — Ambulatory Visit
Admission: RE | Admit: 2022-05-29 | Discharge: 2022-05-29 | Disposition: A | Discharge status: Home / Self Care | Payer: Medicare PPO | Source: Ambulatory Visit | Attending: Radiation Oncology | Admitting: Radiation Oncology

## 2022-05-29 DIAGNOSIS — Z510 Encounter for antineoplastic radiation therapy: Secondary | ICD-10-CM | POA: Diagnosis not present

## 2022-05-29 LAB — RAD ONC ARIA SESSION SUMMARY
Course Elapsed Days: 23
Plan Fractions Treated to Date: 16
Plan Prescribed Dose Per Fraction: 2.5 Gy
Plan Total Fractions Prescribed: 28
Plan Total Prescribed Dose: 70 Gy
Reference Point Dosage Given to Date: 40 Gy
Reference Point Session Dosage Given: 2.5 Gy
Session Number: 16

## 2022-05-30 ENCOUNTER — Other Ambulatory Visit: Payer: Self-pay

## 2022-05-30 ENCOUNTER — Ambulatory Visit
Admission: RE | Admit: 2022-05-30 | Discharge: 2022-05-30 | Disposition: A | Discharge status: Home / Self Care | Payer: Medicare PPO | Source: Ambulatory Visit | Attending: Radiation Oncology | Admitting: Radiation Oncology

## 2022-05-30 DIAGNOSIS — Z510 Encounter for antineoplastic radiation therapy: Secondary | ICD-10-CM | POA: Diagnosis not present

## 2022-05-30 LAB — RAD ONC ARIA SESSION SUMMARY
Course Elapsed Days: 24
Plan Fractions Treated to Date: 17
Plan Prescribed Dose Per Fraction: 2.5 Gy
Plan Total Fractions Prescribed: 28
Plan Total Prescribed Dose: 70 Gy
Reference Point Dosage Given to Date: 42.5 Gy
Reference Point Session Dosage Given: 2.5 Gy
Session Number: 17

## 2022-05-31 ENCOUNTER — Ambulatory Visit
Admission: RE | Admit: 2022-05-31 | Discharge: 2022-05-31 | Disposition: A | Discharge status: Home / Self Care | Payer: Medicare PPO | Source: Ambulatory Visit | Attending: Radiation Oncology | Admitting: Radiation Oncology

## 2022-05-31 ENCOUNTER — Other Ambulatory Visit: Payer: Self-pay

## 2022-05-31 DIAGNOSIS — Z510 Encounter for antineoplastic radiation therapy: Secondary | ICD-10-CM | POA: Diagnosis not present

## 2022-05-31 LAB — RAD ONC ARIA SESSION SUMMARY
Course Elapsed Days: 25
Plan Fractions Treated to Date: 18
Plan Prescribed Dose Per Fraction: 2.5 Gy
Plan Total Fractions Prescribed: 28
Plan Total Prescribed Dose: 70 Gy
Reference Point Dosage Given to Date: 45 Gy
Reference Point Session Dosage Given: 2.5 Gy
Session Number: 18

## 2022-06-03 ENCOUNTER — Other Ambulatory Visit: Payer: Self-pay

## 2022-06-03 ENCOUNTER — Telehealth: Payer: Self-pay

## 2022-06-03 ENCOUNTER — Other Ambulatory Visit (HOSPITAL_COMMUNITY): Payer: Self-pay

## 2022-06-03 ENCOUNTER — Ambulatory Visit
Admission: RE | Admit: 2022-06-03 | Discharge: 2022-06-03 | Disposition: A | Discharge status: Home / Self Care | Payer: Medicare PPO | Source: Ambulatory Visit | Attending: Radiation Oncology | Admitting: Radiation Oncology

## 2022-06-03 DIAGNOSIS — Z510 Encounter for antineoplastic radiation therapy: Secondary | ICD-10-CM | POA: Diagnosis not present

## 2022-06-03 LAB — RAD ONC ARIA SESSION SUMMARY
Course Elapsed Days: 28
Plan Fractions Treated to Date: 19
Plan Prescribed Dose Per Fraction: 2.5 Gy
Plan Total Fractions Prescribed: 28
Plan Total Prescribed Dose: 70 Gy
Reference Point Dosage Given to Date: 47.5 Gy
Reference Point Session Dosage Given: 2.5 Gy
Session Number: 19

## 2022-06-03 NOTE — Telephone Encounter (Signed)
Pharmacy Patient Advocate Encounter  Prior Authorization for REPATHA 140 MG/ML INJ has been approved.    PA# [id number] Effective dates: 05/20/22 through 05/20/23   Received notification from [facility] that prior authorization for REPATHA 140 MG/ML INJ is needed.    PA submitted on 06/03/22 Key [id number] Status is pending  Karie Soda, [place] Patient Advocate Specialist Direct Number: [phone] Fax: [phone]

## 2022-06-04 ENCOUNTER — Ambulatory Visit
Admission: RE | Admit: 2022-06-04 | Discharge: 2022-06-04 | Disposition: A | Discharge status: Home / Self Care | Payer: Medicare PPO | Source: Ambulatory Visit | Attending: Radiation Oncology | Admitting: Radiation Oncology

## 2022-06-04 ENCOUNTER — Other Ambulatory Visit: Payer: Self-pay

## 2022-06-04 DIAGNOSIS — Z510 Encounter for antineoplastic radiation therapy: Secondary | ICD-10-CM | POA: Diagnosis not present

## 2022-06-04 LAB — RAD ONC ARIA SESSION SUMMARY
Course Elapsed Days: 29
Plan Fractions Treated to Date: 20
Plan Prescribed Dose Per Fraction: 2.5 Gy
Plan Total Fractions Prescribed: 28
Plan Total Prescribed Dose: 70 Gy
Reference Point Dosage Given to Date: 50 Gy
Reference Point Session Dosage Given: 2.5 Gy
Session Number: 20

## 2022-06-05 ENCOUNTER — Other Ambulatory Visit: Payer: Self-pay

## 2022-06-05 ENCOUNTER — Ambulatory Visit
Admission: RE | Admit: 2022-06-05 | Discharge: 2022-06-05 | Disposition: A | Discharge status: Home / Self Care | Payer: Medicare PPO | Source: Ambulatory Visit | Attending: Radiation Oncology | Admitting: Radiation Oncology

## 2022-06-05 DIAGNOSIS — Z510 Encounter for antineoplastic radiation therapy: Secondary | ICD-10-CM | POA: Diagnosis not present

## 2022-06-05 LAB — RAD ONC ARIA SESSION SUMMARY
Course Elapsed Days: 30
Plan Fractions Treated to Date: 21
Plan Prescribed Dose Per Fraction: 2.5 Gy
Plan Total Fractions Prescribed: 28
Plan Total Prescribed Dose: 70 Gy
Reference Point Dosage Given to Date: 52.5 Gy
Reference Point Session Dosage Given: 2.5 Gy
Session Number: 21

## 2022-06-06 ENCOUNTER — Ambulatory Visit
Admission: RE | Admit: 2022-06-06 | Discharge: 2022-06-06 | Disposition: A | Discharge status: Home / Self Care | Payer: Medicare PPO | Source: Ambulatory Visit | Attending: Radiation Oncology | Admitting: Radiation Oncology

## 2022-06-06 ENCOUNTER — Other Ambulatory Visit: Payer: Self-pay

## 2022-06-06 ENCOUNTER — Ambulatory Visit: Payer: Medicare PPO

## 2022-06-06 DIAGNOSIS — Z510 Encounter for antineoplastic radiation therapy: Secondary | ICD-10-CM | POA: Diagnosis not present

## 2022-06-06 LAB — RAD ONC ARIA SESSION SUMMARY
Course Elapsed Days: 31
Plan Fractions Treated to Date: 22
Plan Prescribed Dose Per Fraction: 2.5 Gy
Plan Total Fractions Prescribed: 28
Plan Total Prescribed Dose: 70 Gy
Reference Point Dosage Given to Date: 55 Gy
Reference Point Session Dosage Given: 2.5 Gy
Session Number: 22

## 2022-06-07 ENCOUNTER — Ambulatory Visit
Admission: RE | Admit: 2022-06-07 | Discharge: 2022-06-07 | Disposition: A | Discharge status: Home / Self Care | Payer: Medicare PPO | Source: Ambulatory Visit | Attending: Radiation Oncology | Admitting: Radiation Oncology

## 2022-06-07 ENCOUNTER — Ambulatory Visit: Payer: Medicare PPO

## 2022-06-07 ENCOUNTER — Other Ambulatory Visit (HOSPITAL_COMMUNITY): Payer: Self-pay

## 2022-06-07 ENCOUNTER — Other Ambulatory Visit: Payer: Self-pay

## 2022-06-07 DIAGNOSIS — Z510 Encounter for antineoplastic radiation therapy: Secondary | ICD-10-CM | POA: Diagnosis not present

## 2022-06-07 LAB — RAD ONC ARIA SESSION SUMMARY
Course Elapsed Days: 32
Plan Fractions Treated to Date: 23
Plan Prescribed Dose Per Fraction: 2.5 Gy
Plan Total Fractions Prescribed: 28
Plan Total Prescribed Dose: 70 Gy
Reference Point Dosage Given to Date: 57.5 Gy
Reference Point Session Dosage Given: 2.5 Gy
Session Number: 23

## 2022-06-10 ENCOUNTER — Other Ambulatory Visit: Payer: Self-pay

## 2022-06-10 ENCOUNTER — Ambulatory Visit
Admission: RE | Admit: 2022-06-10 | Discharge: 2022-06-10 | Disposition: A | Discharge status: Home / Self Care | Payer: Medicare PPO | Source: Ambulatory Visit | Attending: Radiation Oncology | Admitting: Radiation Oncology

## 2022-06-10 DIAGNOSIS — Z510 Encounter for antineoplastic radiation therapy: Secondary | ICD-10-CM | POA: Diagnosis not present

## 2022-06-10 LAB — RAD ONC ARIA SESSION SUMMARY
Course Elapsed Days: 35
Plan Fractions Treated to Date: 24
Plan Prescribed Dose Per Fraction: 2.5 Gy
Plan Total Fractions Prescribed: 28
Plan Total Prescribed Dose: 70 Gy
Reference Point Dosage Given to Date: 60 Gy
Reference Point Session Dosage Given: 2.5 Gy
Session Number: 24

## 2022-06-11 ENCOUNTER — Other Ambulatory Visit: Payer: Self-pay

## 2022-06-11 ENCOUNTER — Ambulatory Visit
Admission: RE | Admit: 2022-06-11 | Discharge: 2022-06-11 | Disposition: A | Discharge status: Home / Self Care | Payer: Medicare PPO | Source: Ambulatory Visit | Attending: Radiation Oncology | Admitting: Radiation Oncology

## 2022-06-11 DIAGNOSIS — Z510 Encounter for antineoplastic radiation therapy: Secondary | ICD-10-CM | POA: Diagnosis not present

## 2022-06-11 LAB — RAD ONC ARIA SESSION SUMMARY
Course Elapsed Days: 36
Plan Fractions Treated to Date: 25
Plan Prescribed Dose Per Fraction: 2.5 Gy
Plan Total Fractions Prescribed: 28
Plan Total Prescribed Dose: 70 Gy
Reference Point Dosage Given to Date: 62.5 Gy
Reference Point Session Dosage Given: 2.5 Gy
Session Number: 25

## 2022-06-12 ENCOUNTER — Other Ambulatory Visit: Payer: Self-pay

## 2022-06-12 ENCOUNTER — Ambulatory Visit
Admission: RE | Admit: 2022-06-12 | Discharge: 2022-06-12 | Disposition: A | Discharge status: Home / Self Care | Payer: Medicare PPO | Source: Ambulatory Visit | Attending: Radiation Oncology | Admitting: Radiation Oncology

## 2022-06-12 DIAGNOSIS — Z510 Encounter for antineoplastic radiation therapy: Secondary | ICD-10-CM | POA: Diagnosis not present

## 2022-06-12 LAB — RAD ONC ARIA SESSION SUMMARY
Course Elapsed Days: 37
Plan Fractions Treated to Date: 26
Plan Prescribed Dose Per Fraction: 2.5 Gy
Plan Total Fractions Prescribed: 28
Plan Total Prescribed Dose: 70 Gy
Reference Point Dosage Given to Date: 65 Gy
Reference Point Session Dosage Given: 2.5 Gy
Session Number: 26

## 2022-06-13 ENCOUNTER — Ambulatory Visit
Admission: RE | Admit: 2022-06-13 | Discharge: 2022-06-13 | Disposition: A | Discharge status: Home / Self Care | Payer: Medicare PPO | Source: Ambulatory Visit | Attending: Radiation Oncology | Admitting: Radiation Oncology

## 2022-06-13 ENCOUNTER — Other Ambulatory Visit: Payer: Self-pay

## 2022-06-13 DIAGNOSIS — Z510 Encounter for antineoplastic radiation therapy: Secondary | ICD-10-CM | POA: Diagnosis not present

## 2022-06-13 LAB — RAD ONC ARIA SESSION SUMMARY
Course Elapsed Days: 38
Plan Fractions Treated to Date: 27
Plan Prescribed Dose Per Fraction: 2.5 Gy
Plan Total Fractions Prescribed: 28
Plan Total Prescribed Dose: 70 Gy
Reference Point Dosage Given to Date: 67.5 Gy
Reference Point Session Dosage Given: 2.5 Gy
Session Number: 27

## 2022-06-14 ENCOUNTER — Other Ambulatory Visit: Payer: Self-pay

## 2022-06-14 ENCOUNTER — Ambulatory Visit
Admission: RE | Admit: 2022-06-14 | Discharge: 2022-06-14 | Disposition: A | Discharge status: Home / Self Care | Payer: Medicare PPO | Source: Ambulatory Visit | Attending: Radiation Oncology | Admitting: Radiation Oncology

## 2022-06-14 ENCOUNTER — Encounter: Payer: Self-pay | Admitting: Urology

## 2022-06-14 ENCOUNTER — Other Ambulatory Visit (HOSPITAL_COMMUNITY): Payer: Self-pay

## 2022-06-14 DIAGNOSIS — Z510 Encounter for antineoplastic radiation therapy: Secondary | ICD-10-CM | POA: Diagnosis not present

## 2022-06-14 LAB — RAD ONC ARIA SESSION SUMMARY
Course Elapsed Days: 39
Plan Fractions Treated to Date: 28
Plan Prescribed Dose Per Fraction: 2.5 Gy
Plan Total Fractions Prescribed: 28
Plan Total Prescribed Dose: 70 Gy
Reference Point Dosage Given to Date: 70 Gy
Reference Point Session Dosage Given: 2.5 Gy
Session Number: 28

## 2022-06-24 ENCOUNTER — Other Ambulatory Visit (HOSPITAL_COMMUNITY): Payer: Self-pay

## 2022-06-25 ENCOUNTER — Other Ambulatory Visit: Payer: Self-pay

## 2022-06-25 ENCOUNTER — Other Ambulatory Visit (HOSPITAL_COMMUNITY): Payer: Self-pay

## 2022-07-05 ENCOUNTER — Other Ambulatory Visit: Payer: Self-pay | Admitting: Urology

## 2022-07-05 ENCOUNTER — Other Ambulatory Visit (HOSPITAL_COMMUNITY): Payer: Self-pay

## 2022-07-05 DIAGNOSIS — C61 Malignant neoplasm of prostate: Secondary | ICD-10-CM

## 2022-07-05 NOTE — Progress Notes (Signed)
                                                                                                                                                             Patient Name: ISSAM COMMINGS MRN: [id number] DOB: [birth year] Referring Physician: Conception Oms Winter Date of Service: 06/14/2022 [facility], [place]                                                        End Of Treatment Note  Diagnoses: C61-Malignant neoplasm of prostate  Cancer Staging: 67 y.o. gentleman with Stage T1c adenocarcinoma of the prostate with Gleason score of 3+4, and PSA of 9.88.    Intent: Curative  Radiation Treatment Dates: 05/06/2022 through 06/14/2022 Site Technique Total Dose (Gy) Dose per Fx (Gy) Completed Fx Beam Energies  Prostate: Prostate IMRT 70/70 2.5 28/28 6X   Narrative: The patient tolerated radiation therapy relatively well.  He did report modest fatigue, nocturia x 3 and dysuria.  The dysuria was managed with AZO as needed.  Plan: The patient will receive a call in about one month from the radiation oncology department. He will continue follow up with his urologist, Dr. Lovena Neighbours, as well.  ------------------------------------------------   Tyler Pita, MD [place]: [phone]  Fax: [phone] [email or web address]  Skype  [email or web address]

## 2022-07-11 NOTE — Progress Notes (Signed)
Patient was a RadOnc Consult on 10/24/21 for his Stage T1c adenocarcinoma of the prostate with Gleason score of 3+4, and PSA of 9.88.  Patient in agreement to proceed with treatment recommendations of 5.5 weeks of external radiation and had his final radiation treatment on 06/14/22.   Patient is scheduled for a post treatment nurse call on 07/16/2022. RN called to [facility] to schedule patient's posttreatment PSA and follow up with Dr. Lovena Neighbours, however, was notified the business office needs to speak with patient prior to making any appointments.  RN reviewed with patient, verbalized understanding and will plan to call office.  I did encourage patient to reach out to me after contacting [facility] if he is unable to schedule.  Verbalized understanding and agreement.

## 2022-07-15 ENCOUNTER — Other Ambulatory Visit (HOSPITAL_COMMUNITY): Payer: Self-pay

## 2022-07-15 MED ORDER — ROSUVASTATIN CALCIUM 20 MG PO TABS
20.0000 mg | ORAL_TABLET | Freq: Every day | ORAL | 3 refills | Status: DC
  Filled 2022-07-15: qty 90, 90d supply, fill #0

## 2022-07-15 MED ORDER — JARDIANCE 25 MG PO TABS
25.0000 mg | ORAL_TABLET | Freq: Every day | ORAL | 3 refills | Status: DC
  Filled 2022-07-15 – 2022-10-07 (×3): qty 90, 90d supply, fill #0
  Filled 2023-01-09: qty 90, 90d supply, fill #1
  Filled 2023-07-07: qty 90, 90d supply, fill #2

## 2022-07-15 MED ORDER — LOSARTAN POTASSIUM 25 MG PO TABS
ORAL_TABLET | ORAL | 3 refills | Status: DC
  Filled 2022-07-15 – 2022-09-03 (×2): qty 135, 90d supply, fill #0

## 2022-07-16 ENCOUNTER — Ambulatory Visit
Admission: RE | Admit: 2022-07-16 | Discharge: 2022-07-16 | Disposition: A | Discharge status: Home / Self Care | Payer: Medicare PPO | Source: Ambulatory Visit | Attending: Radiation Oncology | Admitting: Radiation Oncology

## 2022-07-16 NOTE — Progress Notes (Signed)
  Radiation Oncology         [phone]) [phone] ________________________________  Name: Thomas Gardner MRN: [id number]  Date of Service: 07/16/2022  DOB: [birth year]  Post Treatment Telephone Note  Diagnosis:  67 y.o. gentleman with Stage T1c adenocarcinoma of the prostate with Gleason score of 3+4, and PSA of 9.88.    Intent: Curative  Radiation Treatment Dates: 05/06/2022 through 06/14/2022 Site Technique Total Dose (Gy) Dose per Fx (Gy) Completed Fx Beam Energies  Prostate: Prostate IMRT 70/70 2.5 28/28 6X   (as documented in provider EOT note)   Pre Treatment IPSS Score: 20 (as documented in the provider consult note)   The patient was available for call today.   Symptoms of fatigue have improved since completing therapy.  Symptoms of bladder changes have improved since completing therapy. Current symptoms include none, and medications for bladder symptoms include none.  Symptoms of bowel changes have improved since completing therapy. Current symptoms include none, and medications for bowel symptoms include none.    Post Treatment IPSS Score: IPSS Questionnaire (AUA-7): Over the past month.   1)  How often have you had a sensation of not emptying your bladder completely after you finish urinating?  0 - Not at all  2)  How often have you had to urinate again less than two hours after you finished urinating? 0 - Not at all  3)  How often have you found you stopped and started again several times when you urinated?  0 - Not at all  4) How difficult have you found it to postpone urination?  0 - Not at all  5) How often have you had a weak urinary stream?  0 - Not at all  6) How often have you had to push or strain to begin urination?  3 - About half the time  7) How many times did you most typically get up to urinate from the time you went to bed until the time you got up in the morning?  2 - 2 times  Total score:  5. Which indicates mild symptoms  0-7 mildly symptomatic   8-19  moderately symptomatic   20-35 severely symptomatic    Patient has a scheduled follow up visit with his urologist, Dr. Lovena Neighbours, on 08/05/2022 for ongoing surveillance. He was counseled that PSA levels will be drawn in the urology office, and was reassured that additional time is expected to improve bowel and bladder symptoms. He was encouraged to call back with concerns or questions regarding radiation.   This concludes the interview.   Leandra Kern, LPN

## 2022-07-25 ENCOUNTER — Other Ambulatory Visit (HOSPITAL_COMMUNITY): Payer: Self-pay

## 2022-07-25 ENCOUNTER — Other Ambulatory Visit: Payer: Self-pay | Admitting: Cardiovascular Disease

## 2022-07-25 MED ORDER — REPATHA SURECLICK 140 MG/ML ~~LOC~~ SOAJ
140.0000 mg | SUBCUTANEOUS | 3 refills | Status: DC
  Filled 2022-07-25: qty 2, 28d supply, fill #0
  Filled 2022-09-03: qty 2, 28d supply, fill #1
  Filled 2022-10-08: qty 2, 28d supply, fill #2
  Filled 2022-11-04: qty 2, 28d supply, fill #3

## 2022-07-25 MED ORDER — TIRZEPATIDE 5 MG/0.5ML ~~LOC~~ SOAJ
5.0000 mg | SUBCUTANEOUS | 2 refills | Status: DC
  Filled 2022-07-25: qty 2, 28d supply, fill #0

## 2022-07-25 NOTE — Telephone Encounter (Signed)
Rx request sent to pharmacy.  

## 2022-07-30 ENCOUNTER — Inpatient Hospital Stay: Payer: Medicare PPO | Attending: Adult Health | Admitting: *Deleted

## 2022-07-30 ENCOUNTER — Encounter: Payer: Self-pay | Admitting: *Deleted

## 2022-07-30 VITALS — BP 144/75 | HR 88 | Temp 98.2°F | Ht 66.0 in | Wt 222.4 lb

## 2022-07-30 DIAGNOSIS — C61 Malignant neoplasm of prostate: Secondary | ICD-10-CM

## 2022-07-30 NOTE — Progress Notes (Signed)
Patient presents today at [place] appointment. Allergies and medications were reviewed. Pt's vitals were normal and he denies pain. Pt did say he still has moments of fatigue since treatment. He rates the fatigue  7/10. Pt states that the urinary frequency and urgency has gotten better. He is only getting up once at bedtime to the bathroom. Pt says urine flow is still weak at the beginning of the void but gets better during the void. Bowels are doing ok., having a few episodes of constipation but increasing water intake is helping. Pt has joined the [employer].,and is getting in a few steps in at work.  Pt mentioned that he hasn't had a colonoscopy in quite a few years. He had a more recent cologuard which was negative.Today I have referred him to a gastroenterologist because of his family hx of colon cancer. We discussed  diet and exercise . I reviewed the "Nutrition Rainbow" with patient. Vaccinations were discussed with patient. Pt did not have a flu vaccine this season because in the past he got sick afterwards. Pt denies smoking and drinking. SCP reviewed and completed.

## 2022-09-03 ENCOUNTER — Other Ambulatory Visit (HOSPITAL_COMMUNITY): Payer: Self-pay

## 2022-09-04 ENCOUNTER — Other Ambulatory Visit: Payer: Self-pay

## 2022-09-11 ENCOUNTER — Other Ambulatory Visit (HOSPITAL_COMMUNITY): Payer: Self-pay

## 2022-09-11 MED ORDER — LOSARTAN POTASSIUM 25 MG PO TABS
ORAL_TABLET | ORAL | 3 refills | Status: DC
  Filled 2022-09-11: qty 135, 90d supply, fill #0

## 2022-09-11 MED ORDER — CHLORTHALIDONE 25 MG PO TABS
25.0000 mg | ORAL_TABLET | Freq: Every day | ORAL | 3 refills | Status: DC
  Filled 2022-09-11 – 2022-10-07 (×2): qty 90, 90d supply, fill #0
  Filled 2022-12-25: qty 90, 90d supply, fill #1
  Filled 2023-04-04: qty 90, 90d supply, fill #2
  Filled 2023-07-04: qty 90, 90d supply, fill #3

## 2022-09-23 ENCOUNTER — Other Ambulatory Visit (HOSPITAL_COMMUNITY): Payer: Self-pay

## 2022-09-23 ENCOUNTER — Ambulatory Visit (INDEPENDENT_AMBULATORY_CARE_PROVIDER_SITE_OTHER): Payer: Medicare PPO | Admitting: Podiatry

## 2022-09-23 ENCOUNTER — Encounter: Payer: Self-pay | Admitting: Podiatry

## 2022-09-23 DIAGNOSIS — Z794 Long term (current) use of insulin: Secondary | ICD-10-CM

## 2022-09-23 DIAGNOSIS — M79675 Pain in left toe(s): Secondary | ICD-10-CM

## 2022-09-23 DIAGNOSIS — B351 Tinea unguium: Secondary | ICD-10-CM

## 2022-09-23 DIAGNOSIS — M79674 Pain in right toe(s): Secondary | ICD-10-CM

## 2022-09-23 DIAGNOSIS — E0800 Diabetes mellitus due to underlying condition with hyperosmolarity without nonketotic hyperglycemic-hyperosmolar coma (NKHHC): Secondary | ICD-10-CM

## 2022-09-23 NOTE — Progress Notes (Signed)
  Subjective:  Patient ID: Thomas Gardner, male    DOB: 1955-10-04,   MRN: [id number]  Chief Complaint  Patient presents with   Nail Problem     Patient states he is a diabetic  Diabetic foot care    Diabetes    Diabetic foot exam    Plantar Fasciitis    67 y.o. male presents for concern of thickened elongated and painful nails that are difficult to trim. Requesting to have them trimmed today. Relates burning and tingling in their feet. Patient is diabetic and last A1c was  Lab Results  Component Value Date   HGBA1C 9.9 (H) 03/03/2021   .   PCP:  Ellyn Hack, MD    . Denies any other pedal complaints. Denies n/v/f/c.   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   nueruologist---- dr Pearlean Brownie;    04-10-2022  per pt residul at times balance impaired   Arthritis    back shoulders and legs   Asthma    Bilateral lower extremity edema    @ times, goes away with legs elevated   CHF (congestive heart failure) (HCC)    Complication of anesthesia    woke up with endoscopy yrs ago   Diabetes mellitus type 2, insulin dependent (HCC) 2003   Does mobilize using cane    Familial hyperlipidemia 05/07/2021   GERD (gastroesophageal reflux disease)    History of hiatal hernia    Hyperlipidemia    Hypertension    OSA (obstructive sleep apnea)    did not tolerate cpap, uses pillow for elevation   Prostate cancer (HCC)     Objective:  Physical Exam: Vascular: DP/PT pulses 2/4 bilateral. CFT <3 seconds. Absent hair growth on digits. Edema noted to bilateral lower extremities. Xerosis noted bilaterally.  Skin. No lacerations or abrasions bilateral feet. Nails 1-5 bilateral  are thickened discolored and elongated with subungual debris.  Musculoskeletal: MMT 5/5 bilateral lower extremities in DF, PF, Inversion and Eversion. Deceased ROM in DF of ankle joint.  Neurological: Sensation intact to light touch. Protective sensation diminished  bilateral.    Assessment:   1. Pain due to onychomycosis of toenails of both feet   2. Diabetes mellitus due to underlying condition with hyperosmolarity without coma, with long-term current use of insulin (HCC)      Plan:  Patient was evaluated and treated and all questions answered. -Discussed and educated patient on diabetic foot care, especially with  regards to the vascular, neurological and musculoskeletal systems.  -Stressed the importance of good glycemic control and the detriment of not  controlling glucose levels in relation to the foot. -Discussed supportive shoes at all times and checking feet regularly.  -Mechanically debrided all nails 1-5 bilateral using sterile nail nipper and filed with dremel without incident  -Answered all patient questions -Patient to return  in 3 months for at risk foot care -Patient advised to call the office if any problems or questions arise in the meantime.   Louann Sjogren, DPM

## 2022-09-24 ENCOUNTER — Other Ambulatory Visit (HOSPITAL_COMMUNITY): Payer: Self-pay

## 2022-09-25 ENCOUNTER — Other Ambulatory Visit (HOSPITAL_COMMUNITY): Payer: Self-pay

## 2022-09-25 MED ORDER — MOUNJARO 2.5 MG/0.5ML ~~LOC~~ SOAJ
2.5000 mg | SUBCUTANEOUS | 0 refills | Status: DC
  Filled 2022-09-25: qty 2, 28d supply, fill #0

## 2022-09-25 MED ORDER — AMOXICILLIN-POT CLAVULANATE 875-125 MG PO TABS
1.0000 | ORAL_TABLET | Freq: Two times a day (BID) | ORAL | 0 refills | Status: DC
  Filled 2022-09-25: qty 20, 10d supply, fill #0

## 2022-09-26 ENCOUNTER — Other Ambulatory Visit (HOSPITAL_COMMUNITY): Payer: Self-pay

## 2022-09-26 MED ORDER — LOSARTAN POTASSIUM 25 MG PO TABS
ORAL_TABLET | ORAL | 3 refills | Status: DC
  Filled 2023-07-14: qty 135, 90d supply, fill #0

## 2022-10-07 ENCOUNTER — Other Ambulatory Visit (HOSPITAL_COMMUNITY): Payer: Self-pay

## 2022-10-08 ENCOUNTER — Other Ambulatory Visit (HOSPITAL_COMMUNITY): Payer: Self-pay

## 2022-10-21 ENCOUNTER — Other Ambulatory Visit (HOSPITAL_COMMUNITY): Payer: Self-pay

## 2022-10-21 ENCOUNTER — Other Ambulatory Visit: Payer: Self-pay | Admitting: Family Medicine

## 2022-10-21 DIAGNOSIS — R222 Localized swelling, mass and lump, trunk: Secondary | ICD-10-CM

## 2022-10-21 MED ORDER — FUROSEMIDE 20 MG PO TABS
20.0000 mg | ORAL_TABLET | Freq: Every day | ORAL | 2 refills | Status: DC
  Filled 2022-10-21: qty 30, 30d supply, fill #0

## 2022-10-21 MED ORDER — MOUNJARO 2.5 MG/0.5ML ~~LOC~~ SOAJ
2.5000 mg | SUBCUTANEOUS | 2 refills | Status: DC
  Filled 2022-10-21: qty 2, 28d supply, fill #0
  Filled 2022-12-10: qty 2, 28d supply, fill #1
  Filled 2023-01-16: qty 2, 28d supply, fill #2

## 2022-11-04 ENCOUNTER — Other Ambulatory Visit (HOSPITAL_COMMUNITY): Payer: Self-pay

## 2022-11-13 ENCOUNTER — Other Ambulatory Visit: Payer: Self-pay

## 2022-11-13 ENCOUNTER — Other Ambulatory Visit (HOSPITAL_COMMUNITY): Payer: Self-pay

## 2022-11-18 ENCOUNTER — Other Ambulatory Visit (HOSPITAL_COMMUNITY): Payer: Self-pay

## 2022-11-18 ENCOUNTER — Other Ambulatory Visit: Payer: Self-pay | Admitting: Cardiovascular Disease

## 2022-11-18 NOTE — Telephone Encounter (Signed)
Please call pt to schedule overdue follow-up appointment with Dr. Duke Salvia or APP for refills. Last OV 11/2021---was due in October 2023. Thank you!

## 2022-11-18 NOTE — Telephone Encounter (Signed)
Patient will call back to schedule ?

## 2022-11-26 ENCOUNTER — Other Ambulatory Visit (HOSPITAL_COMMUNITY): Payer: Self-pay

## 2022-11-26 MED ORDER — REPATHA SURECLICK 140 MG/ML ~~LOC~~ SOAJ
140.0000 mg | SUBCUTANEOUS | 0 refills | Status: DC
  Filled 2022-11-26 – 2022-12-10 (×2): qty 2, 28d supply, fill #0

## 2022-11-26 NOTE — Telephone Encounter (Signed)
Rx request sent to pharmacy.  

## 2022-11-26 NOTE — Telephone Encounter (Signed)
Scheduled  12/10/22 with Gillian Shields, NP

## 2022-11-27 ENCOUNTER — Other Ambulatory Visit (HOSPITAL_COMMUNITY): Payer: Self-pay

## 2022-12-05 ENCOUNTER — Other Ambulatory Visit (HOSPITAL_COMMUNITY): Payer: Self-pay

## 2022-12-10 ENCOUNTER — Other Ambulatory Visit (HOSPITAL_COMMUNITY): Payer: Self-pay

## 2022-12-10 ENCOUNTER — Other Ambulatory Visit (INDEPENDENT_AMBULATORY_CARE_PROVIDER_SITE_OTHER): Payer: Medicare PPO

## 2022-12-10 ENCOUNTER — Encounter (HOSPITAL_BASED_OUTPATIENT_CLINIC_OR_DEPARTMENT_OTHER): Payer: Self-pay | Admitting: Family

## 2022-12-10 ENCOUNTER — Ambulatory Visit (INDEPENDENT_AMBULATORY_CARE_PROVIDER_SITE_OTHER): Payer: Medicare PPO | Admitting: Family

## 2022-12-10 VITALS — BP 134/82 | HR 86 | Ht 66.0 in | Wt 225.1 lb

## 2022-12-10 DIAGNOSIS — I5042 Chronic combined systolic (congestive) and diastolic (congestive) heart failure: Secondary | ICD-10-CM

## 2022-12-10 DIAGNOSIS — Z8673 Personal history of transient ischemic attack (TIA), and cerebral infarction without residual deficits: Secondary | ICD-10-CM | POA: Diagnosis not present

## 2022-12-10 DIAGNOSIS — I493 Ventricular premature depolarization: Secondary | ICD-10-CM | POA: Diagnosis not present

## 2022-12-10 DIAGNOSIS — G4733 Obstructive sleep apnea (adult) (pediatric): Secondary | ICD-10-CM

## 2022-12-10 DIAGNOSIS — R600 Localized edema: Secondary | ICD-10-CM | POA: Diagnosis not present

## 2022-12-10 DIAGNOSIS — E7849 Other hyperlipidemia: Secondary | ICD-10-CM

## 2022-12-10 NOTE — Patient Instructions (Signed)
Medication Instructions:  Your physician recommends that you continue on your current medications as directed. Please refer to the Current Medication list given to you today.  *If you need a refill on your cardiac medications before your next appointment, please call your pharmacy*   Testing/Procedures: Your physician has requested that you have an echocardiogram. Echocardiography is a painless test that uses sound waves to create images of your heart. It provides your doctor with information about the size and shape of your heart and how well your heart's chambers and valves are working. This procedure takes approximately one hour. There are no restrictions for this procedure. Please do NOT wear cologne, perfume, aftershave, or lotions (deodorant is allowed). Please arrive 15 minutes prior to your appointment time.   Your physician has recommended that you wear a Zio monitor.   This monitor is a medical device that records the heart's electrical activity. Doctors most often use these monitors to diagnose arrhythmias. Arrhythmias are problems with the speed or rhythm of the heartbeat. The monitor is a small device applied to your chest. You can wear one while you do your normal daily activities. While wearing this monitor if you have any symptoms to push the button and record what you felt. Once you have worn this monitor for the period of time provider prescribed, you will return the monitor device in the postage paid box. Once it is returned they will download the data collected and provide [place] with a report which the provider will then review and we will call you with those results. Important tips:  Avoid showering during the first 24 hours of wearing the monitor. Avoid excessive sweating to help maximize wear time. Do not submerge the device, no hot tubs, and no swimming pools. Keep any lotions or oils away from the patch. After 24 hours you may shower with the patch on. Take brief showers with  your back facing the shower head.  Do not remove patch once it has been placed because that will interrupt data and decrease adhesive wear time. Push the button when you have any symptoms and write down what you were feeling. Once you have completed wearing your monitor, remove and place into box which has postage paid and place in your outgoing mailbox.  If for some reason you have misplaced your box then call our office and we can provide another box and/or mail it off for you.   WatchPAT?  Is a FDA cleared portable home sleep study test that uses a watch and 3 points of contact to monitor 7 different channels, including your heart rate, oxygen saturations, body position, snoring, and chest motion.  The study is easy to use from the comfort of your own home and accurately detect sleep apnea.  Before bed, you attach the chest sensor, attached the sleep apnea bracelet to your nondominant hand, and attach the finger probe.  After the study, the raw data is downloaded from the watch and scored for apnea events.   For more information: [email or web address]  Patient Testing Instructions:  Do not put battery into the device until bedtime when you are ready to begin the test. Please call the support number if you need assistance after following the instructions below: 24 hour support line- [phone] or [facility] support at [phone] (option 2)  Download the [facility] "WatchPAT One" app through the google play store or App Store  Be sure to turn on or enable access to bluetooth in settlings on your smartphone/ device  Make  sure no other bluetooth devices are on and within the vicinity of your smartphone/ device and WatchPAT watch during testing.  Make sure to leave your smart phone/ device plugged in and charging all night.  When ready for bed:  Follow the instructions step by step in the WatchPAT One App to activate the testing device. For additional instructions, including video  instruction, visit the WatchPAT One video on Youtube. You can search for WatchPat One within Youtube (video is 4 minutes and 18 seconds) or enter: https://youtube/watch?v=BCce_vbiwxE Please note: You will be prompted to enter a Pin to connect via bluetooth when starting the test. The PIN will be assigned to you when you receive the test.  The device is disposable, but it recommended that you retain the device until you receive a call letting you know the study has been received and the results have been interpreted.  We will let you know if the study did not transmit to [place] properly after the test is completed. You do not need to call us to confirm the receipt of the test.  Please complete the test within 48 hours of receiving PIN.   Frequently Asked Questions:  What is Watch Dennie Bible one?  A single use fully disposable home sleep apnea testing device and will not need to be returned after completion.  What are the requirements to use WatchPAT one?  The be able to have a successful watchpat one sleep study, you should have your Watch pat one device, your smart phone, watch pat one app, your PIN number and Internet access What type of phone do I need?  You should have a smart phone that uses Android 5.1 and above or any Iphone with IOS 10 and above How can I download the WatchPAT one app?  Based on your device type search for WatchPAT one app either in google play for android devices or APP store for Iphone's Where will I get my PIN for the study?  Your PIN will be provided by your physician's office. It is used for authentication and if you lose/forget your PIN, please reach out to your providers office.  I do not have Internet at home. Can I do WatchPAT one study?  WatchPAT One needs Internet connection throughout the night to be able to transmit the sleep data. You can use your home/local internet or your cellular's data package. However, it is always recommended to use home/local Internet. It is  estimated that between 20MB-30MB will be used with each study.However, the application will be looking for space in the phone to start the study.  What happens if I lose internet or bluetooth connection?  During the internet disconnection, your phone will not be able to transmit the sleep data. All the data, will be stored in your phone. As soon as the internet connection is back on, the phone will being sending the sleep data. During the bluetooth disconnection, WatchPAT one will not be able to to send the sleep data to your phone. Data will be kept in the [facility] one until two devices have bluetooth connection back on. As soon as the connection is back on, WatchPAT one will send the sleep data to the phone.  How long do I need to wear the WatchPAT one?  After you start the study, you should wear the device at least 6 hours.  How far should I keep my phone from the device?  During the night, your phone should be within 15 feet.  What happens  if I leave the room for restroom or other reasons?  Leaving the room for any reason will not cause any problem. As soon as your get back to the room, both devices will reconnect and will continue to send the sleep data. Can I use my phone during the sleep study?  Yes, you can use your phone as usual during the study. But it is recommended to put your watchpat one on when you are ready to go to bed.  How will I get my study results?  A soon as you completed your study, your sleep data will be sent to the provider. They will then share the results with you when they are ready.     Follow-Up: At [facility], you and your health needs are our priority.  As part of our continuing mission to provide you with exceptional heart care, we have created designated Provider Care Teams.  These Care Teams include your primary Cardiologist (physician) and Advanced Practice Providers (APPs -  Physician Assistants and Nurse Practitioners) who all work together  to provide you with the care you need, when you need it.  We recommend signing up for the patient portal called "MyChart".  Sign up information is provided on this After Visit Summary.  MyChart is used to connect with patients for Virtual Visits (Telemedicine).  Patients are able to view lab/test results, encounter notes, upcoming appointments, etc.  Non-urgent messages can be sent to your provider as well.   To learn more about what you can do with MyChart, go to [email or web address].    Your next appointment:   6-8 weeks with Dr. Duke Salvia or Gillian Shields, NP   Other Instructions We will check in on BP in two weeks

## 2022-12-10 NOTE — Progress Notes (Signed)
Cardiology Office Note:  .   Date:  12/10/2022  ID:  GARRETH Gardner, DOB Jul 20, 1955, MRN [id number] PCP: Ellyn Hack, MD  [facility] Providers Cardiologist:  Chilton Si, MD    History of Present Illness: .   Thomas Gardner is a 67 y.o. 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   Myoview 2013 LVEF 37%, no ischemia.  Echo 11/2020 LVEF 55-40%, grade 1 diastolic dysfunction.  Admitted 02/2021 for acute ischemic stroke of left thalamic pyramid.  He severe stenosis proximal left external carotid artery and multifocal moderate to severe stenosis of the right posterior cerebral artery.  Treated with aspirin Plavix for 3 months then aspirin alone.  Echo on admission LVEF 50%.  Required diuretics during hospitalization for diuresis.  Losartan previously increased for BP control.  He also has displayed multiple of his medications to reduce lightheadedness, dizziness.  At last visit 08/2021 he was recommended to start rosuvastatin 3 times per week and continue Repatha.  He presents today for follow up independently. He is experiencing more of his skipped beats. His PCP started Lasix about a month ago for ankle swelling and encouraged him to follow up with cardiology. He does note lightheadedness with quick position changes. Drinks 2-3 glasses of tea, lots of water. Does note recent stressors. He has been splitting many of his medications in half as he was feeling lightheaded - for example taking Carvedilol 0.5 tab QID instead of 1 tab BID. As such, often missing last half tablet with incomplete daily dosing. Notes fatigue, snoring - previously did not tolerate CPAP but with new technology interested in repeat evaluation.   ROS: Please see the  history of present illness.    All other systems reviewed and are negative.   Studies Reviewed: Marland Kitchen   EKG Interpretation Date/Time:  Tuesday December 10 2022 10:08:19 EDT Ventricular Rate:  86 PR Interval:  146 QRS Duration:  120 QT Interval:  378 QTC Calculation: 452 R Axis:   -56  Text Interpretation: Sinus rhythm with occasional Premature ventricular complexes Left anterior fascicular block LVH by voltage Confirmed by Gillian Shields ([id number]) on 12/10/2022 10:12:50 AM    Cardiac Studies & Procedures     STRESS TESTS  MYOCARDIAL PERFUSION IMAGING 08/15/2011   ECHOCARDIOGRAM  ECHOCARDIOGRAM COMPLETE 03/04/2021  Narrative ECHOCARDIOGRAM REPORT    Patient Name:   Thomas Gardner Date of Exam: 03/04/2021 Medical Rec #:  [id number]       Height:       66.0 in Accession #:    [id number]      Weight:       249.6 lb Date of Birth:  May 07, 1956       BSA:          2.197 m Patient Age:    65 years        BP:           169/97 mmHg Patient Gender: M               HR:           86 bpm. Exam Location:  Inpatient  Procedure: 2D Echo, Cardiac Doppler, Color Doppler and 3D Echo  STAT ECHO  Indications:    Stroke  History:        Patient has prior history  of Echocardiogram examinations, most recent 11/29/2020. CHF; Risk Factors:Hypertension, Diabetes, Dyslipidemia and Sleep Apnea.  Sonographer:    Ross Ludwig RDCS (AE) Referring Phys: [id number] Reymundo Poll  IMPRESSIONS   1. Left ventricular ejection fraction, by estimation, is 50%. The left ventricle has low normal function. The left ventricle has no regional wall motion abnormalities. There is mild left ventricular hypertrophy. Left ventricular diastolic parameters are indeterminate. 2. Right ventricular systolic function is normal. The right ventricular size is normal. Tricuspid regurgitation signal is inadequate for assessing PA pressure. 3. The mitral valve is normal in structure. Trivial mitral valve regurgitation. No evidence of  mitral stenosis. 4. The aortic valve is grossly normal. Aortic valve regurgitation is not visualized. No aortic stenosis is present. 5. The inferior vena cava is normal in size with greater than 50% respiratory variability, suggesting right atrial pressure of 3 mmHg.  Conclusion(s)/Recommendation(s): No intracardiac source of embolism detected on this transthoracic study. A transesophageal echocardiogram is recommended to exclude cardiac source of embolism if clinically indicated.  FINDINGS Left Ventricle: Left ventricular ejection fraction, by estimation, is 50%. The left ventricle has low normal function. The left ventricle has no regional wall motion abnormalities. 3D left ventricular ejection fraction analysis performed but not reported based on interpreter judgement due to suboptimal quality. The left ventricular internal cavity size was normal in size. There is mild left ventricular hypertrophy. Left ventricular diastolic parameters are indeterminate.  Right Ventricle: The right ventricular size is normal. No increase in right ventricular wall thickness. Right ventricular systolic function is normal. Tricuspid regurgitation signal is inadequate for assessing PA pressure.  Left Atrium: Left atrial size was normal in size.  Right Atrium: Right atrial size was normal in size.  Pericardium: There is no evidence of pericardial effusion.  Mitral Valve: The mitral valve is normal in structure. Trivial mitral valve regurgitation. No evidence of mitral valve stenosis. MV peak gradient, 4.2 mmHg. The mean mitral valve gradient is 2.0 mmHg.  Tricuspid Valve: The tricuspid valve is normal in structure. Tricuspid valve regurgitation is trivial. No evidence of tricuspid stenosis.  Aortic Valve: The aortic valve is grossly normal. Aortic valve regurgitation is not visualized. No aortic stenosis is present. Aortic valve mean gradient measures 5.0 mmHg. Aortic valve peak gradient measures 7.8 mmHg. Aortic  valve area, by VTI measures 2.04 cm.  Pulmonic Valve: The pulmonic valve was not well visualized. Pulmonic valve regurgitation is trivial. No evidence of pulmonic stenosis.  Aorta: The aortic root is normal in size and structure.  Venous: The inferior vena cava is normal in size with greater than 50% respiratory variability, suggesting right atrial pressure of 3 mmHg.  IAS/Shunts: No atrial level shunt detected by color flow Doppler.   LEFT VENTRICLE PLAX 2D LVIDd:         5.10 cm   Diastology LVIDs:         4.10 cm   LV e' medial:    4.88 cm/s LV PW:         1.30 cm   LV E/e' medial:  12.8 LV IVS:        1.10 cm   LV e' lateral:   7.20 cm/s LVOT diam:     2.10 cm   LV E/e' lateral: 8.7 LV SV:         51 LV SV Index:   23 LVOT Area:     3.46 cm  3D Volume EF: 3D EF:        35 % LV EDV:  174 ml LV ESV:       112 ml LV SV:        62 ml  RIGHT VENTRICLE RV Basal diam:  3.00 cm RV S prime:     10.70 cm/s TAPSE (M-mode): 2.2 cm  LEFT ATRIUM             Index        RIGHT ATRIUM           Index LA diam:        3.80 cm 1.73 cm/m   RA Area:     12.20 cm LA Vol (A2C):   50.3 ml 22.89 ml/m  RA Volume:   25.30 ml  11.52 ml/m LA Vol (A4C):   41.5 ml 18.89 ml/m LA Biplane Vol: 47.2 ml 21.48 ml/m AORTIC VALVE AV Area (Vmax):    1.97 cm AV Area (Vmean):   2.01 cm AV Area (VTI):     2.04 cm AV Vmax:           140.00 cm/s AV Vmean:          100.000 cm/s AV VTI:            0.249 m AV Peak Grad:      7.8 mmHg AV Mean Grad:      5.0 mmHg LVOT Vmax:         79.50 cm/s LVOT Vmean:        57.900 cm/s LVOT VTI:          0.147 m LVOT/AV VTI ratio: 0.59  AORTA Ao Root diam: 3.50 cm Ao Asc diam:  3.40 cm  MITRAL VALVE MV Area (PHT): 4.99 cm    SHUNTS MV Area VTI:   2.54 cm    Systemic VTI:  0.15 m MV Peak grad:  4.2 mmHg    Systemic Diam: 2.10 cm MV Mean grad:  2.0 mmHg MV Vmax:       1.02 m/s MV Vmean:      62.8 cm/s MV Decel Time: 152 msec MV E velocity:  62.40 cm/s MV A velocity: 95.40 cm/s MV E/A ratio:  0.65  Weston Brass MD Electronically signed by Weston Brass MD Signature Date/Time: 03/04/2021/4:16:03 PM    Final             Risk Assessment/Calculations:         STOP-Bang Score:  7      Physical Exam:   VS:  BP 134/82 (BP Location: Left Arm, Patient Position: Sitting, Cuff Size: Normal)   Pulse 86   Ht 5\' 6"  (1.676 m)   Wt 225 lb 1.6 oz (102.1 kg)   BMI 36.33 kg/m    Wt Readings from Last 3 Encounters:  12/10/22 225 lb 1.6 oz (102.1 kg)  07/30/22 222 lb 6.4 oz (100.9 kg)  04/16/22 223 lb 9.6 oz (101.4 kg)    GEN: Well nourished, overweight, well developed in no acute distress NECK: No JVD; No carotid bruits CARDIAC: RRR, no murmurs, rubs, gallops RESPIRATORY:  Clear to auscultation without rales, wheezing or rhonchi  ABDOMEN: Soft, non-tender, non-distended EXTREMITIES:  Trace bilateral pretibial edema.; No deformity   ASSESSMENT AND PLAN: .    PVC -more symptomatic PVC.  He has not been taking complete dosing of carvedilol.  Will move chlorthalidone to lunchtime dosing so that he will hopefully feel less lightheaded when taking carvedilol 25 mg twice daily.  If persistent lightheadedness consider transition to metoprolol.  7-day ZIO placed in clinic to assess  PVC burden.  Update echo to reassess LVEF and rule out valvular abnormalities contributory.  Hx of CVA - Continue aspirin, rosuvastatin, repatha.   HFmrEF - Recently had to start Lasix for LE edema at direction of PCP. No orthopnea, PND. GDMT Carvedilol, lasix, jardiance. Update BMP, update echo. Low sodium diet, fluid restriction <2L, and daily weights encouraged. Educated to contact our office for weight gain of 2 lbs overnight or 5 lbs in one week.   Familial hyperlipidemia - Continue Repatha, Rosuvastatin. Will request lipid panel from PCP.  OSA - Snores, stops breathing, daytime somnolence. Previously did not tolerate CPAP many years ago.   Interested in trying newer CPAP vs inspire device. [facility] home sleep study provided in clinic       Dispo: follow up in 2 months with Dr. Duke Salvia or Alver Sorrow, NP   Signed, Alver Sorrow, NP

## 2022-12-20 ENCOUNTER — Other Ambulatory Visit: Payer: Self-pay | Admitting: Family Medicine

## 2022-12-20 ENCOUNTER — Other Ambulatory Visit (HOSPITAL_COMMUNITY): Payer: Self-pay

## 2022-12-20 MED ORDER — FUROSEMIDE 20 MG PO TABS
20.0000 mg | ORAL_TABLET | Freq: Every day | ORAL | 3 refills | Status: DC
  Filled 2022-12-20: qty 30, 30d supply, fill #0

## 2022-12-20 MED ORDER — INSULIN GLARGINE 100 UNIT/ML SOLOSTAR PEN
16.0000 [IU] | PEN_INJECTOR | Freq: Two times a day (BID) | SUBCUTANEOUS | 11 refills | Status: DC
  Filled 2022-12-20: qty 12, 37d supply, fill #0
  Filled 2023-01-16 – 2023-01-21 (×2): qty 12, 37d supply, fill #1
  Filled 2023-02-20 – 2023-02-21 (×2): qty 12, 37d supply, fill #2
  Filled 2023-03-19 – 2023-03-25 (×2): qty 12, 37d supply, fill #3

## 2022-12-22 IMAGING — US US THYROID
2 series · 13 of 25 positions shown · non-contrast
Comparison: Prior thyroid ultrasound 02/14/2020

CLINICAL DATA: Known thyroid nodules and midline complex cystic
mass. Findings again seen on CT imaging performed recently.

EXAM:
THYROID ULTRASOUND
TECHNIQUE: Ultrasound examination of the thyroid gland and adjacent soft
tissues was performed.

[Series 1: us thyroid · 11 of 50 slices shown]
[im 1/50]
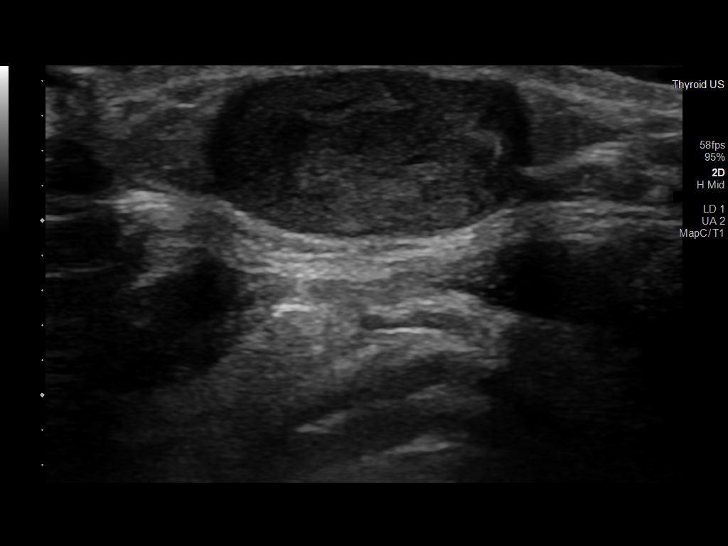
[im 5/50]
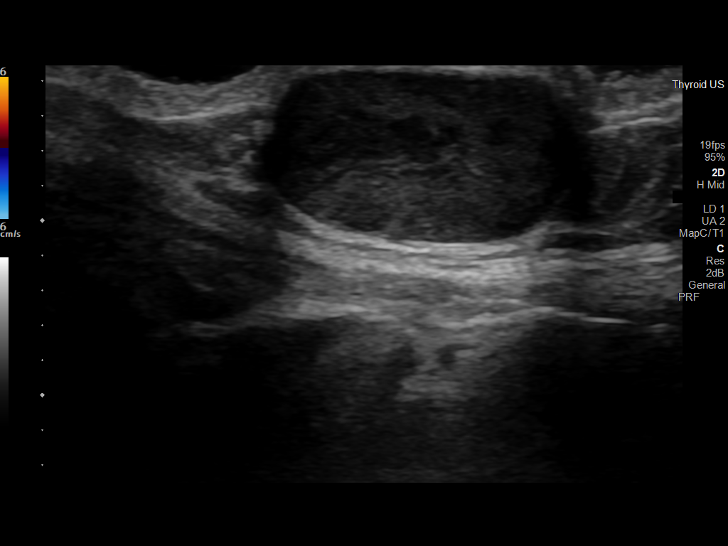
[im 10/50]
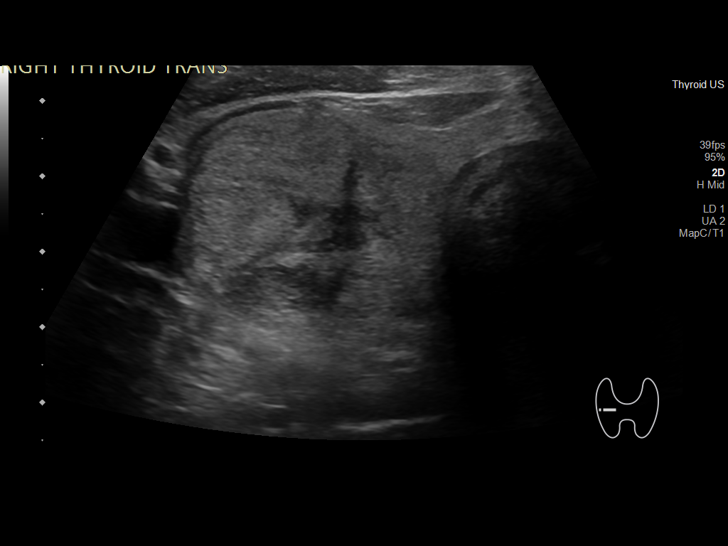
[im 15/50]
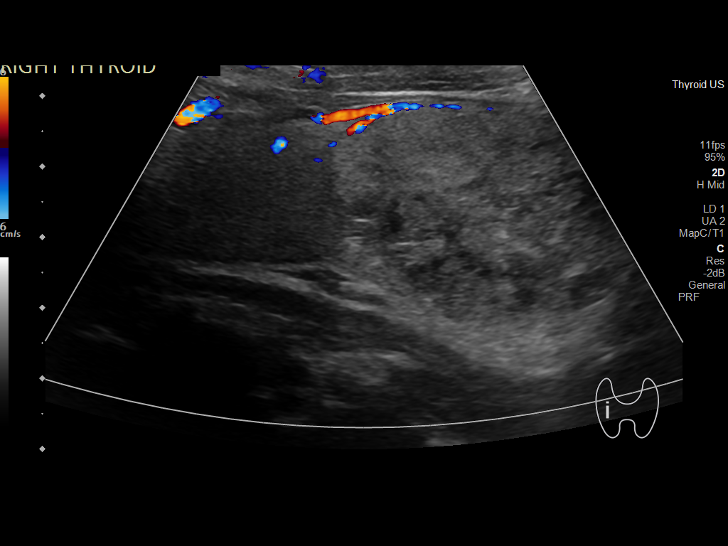
[im 19/50]
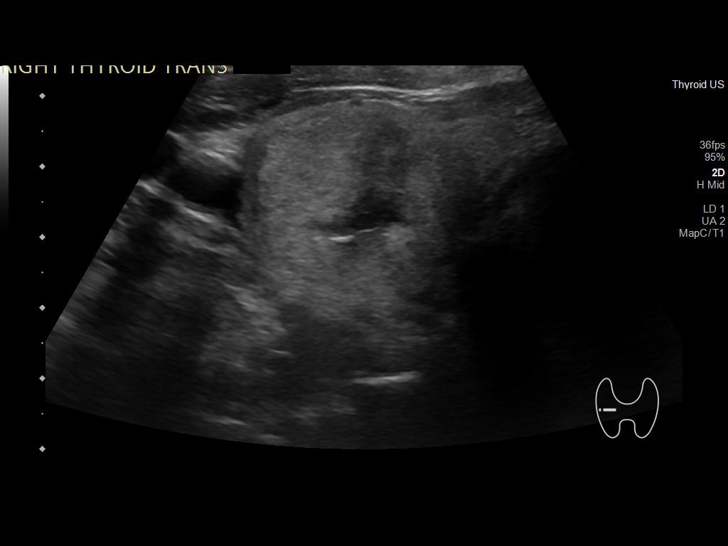
[im 24/50]
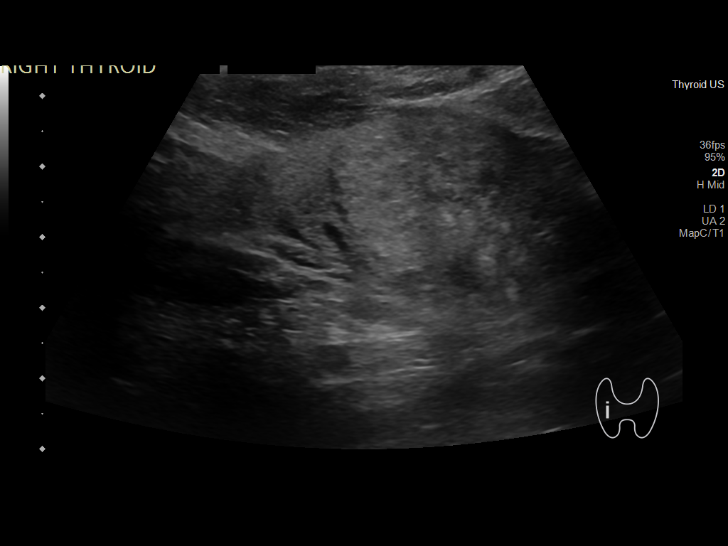
[im 29/50]
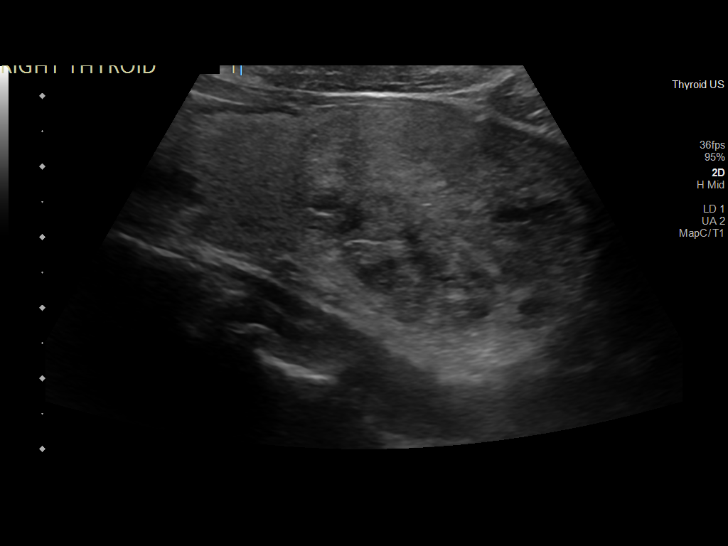
[im 33/50]
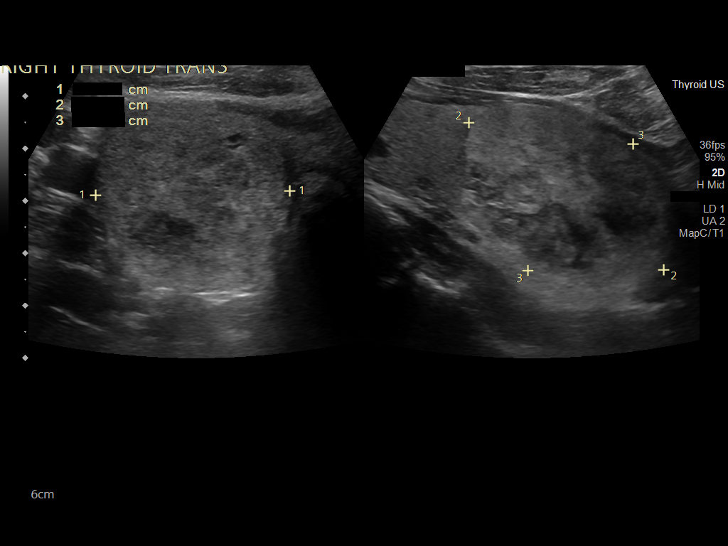
[im 38/50]
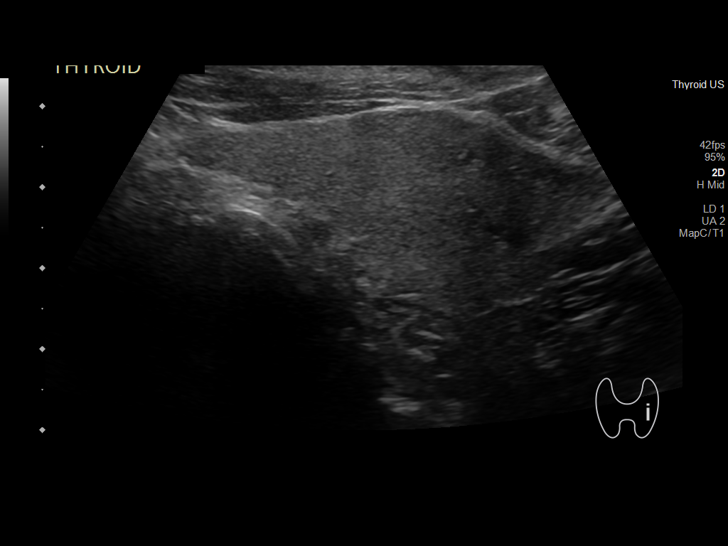
[im 43/50]
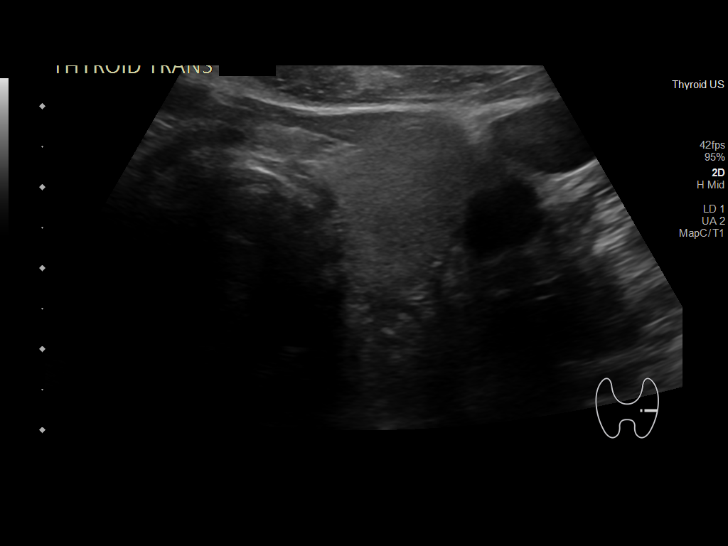
[im 47/50]
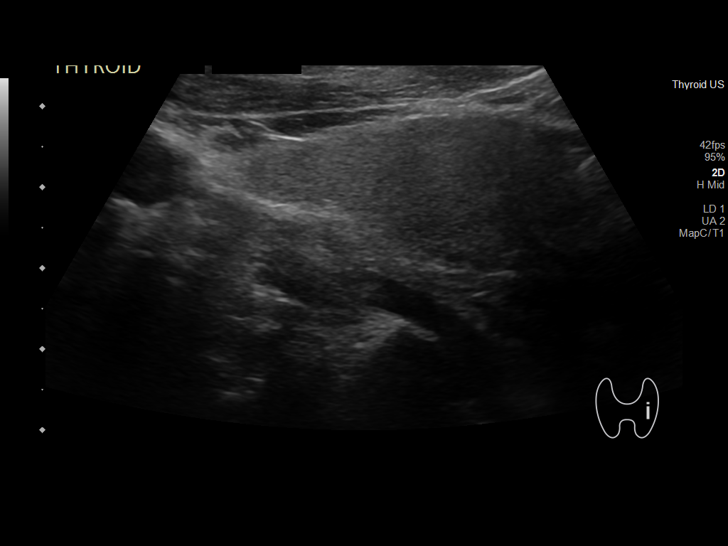

[Series 1001: thyroid us · 2 of 6 slices shown]
[im 1/6]
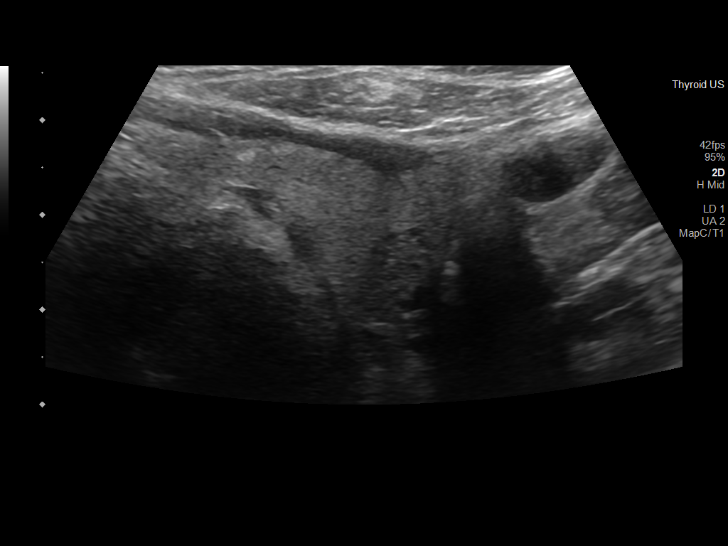
[im 6/6]
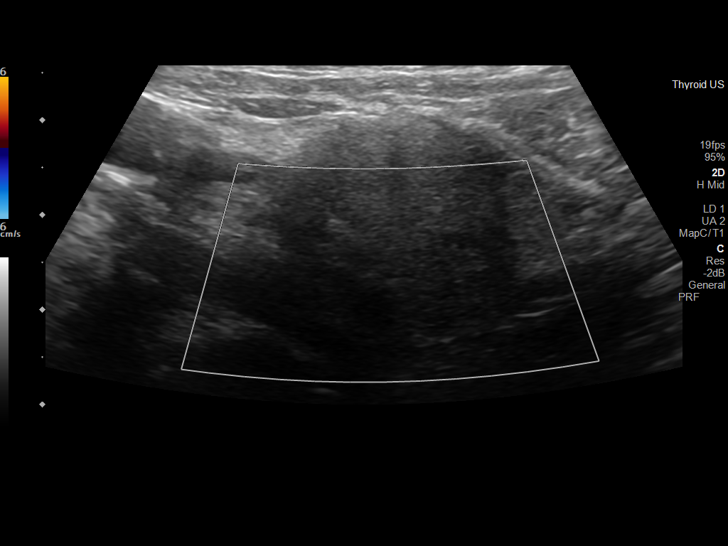

[13 of 25 positions shown; findings below may reference images not displayed]

FINDINGS: Parenchymal Echotexture: Mildly heterogenous

Isthmus: 0.8 cm

Right lobe: 6.1 x 3.9 x 3.5 cm

Left lobe: 5.3 x 2.4 x 2.2 cm

_________________________________________________________

Estimated total number of nodules >/= 1 cm: 1

Number of spongiform nodules >/=  2 cm not described below (TR1): 0

Number of mixed cystic and solid nodules >/= 1.5 cm not described
below (TR2): 0

_________________________________________________________

Nodule # 1:

Prior biopsy: No

Location: Right; Inferior

Maximum size: 4.7 cm; Other 2 dimensions: 3.1 x 3.7 cm, previously,
4.9 x 3.5 x 3.5 cm

Composition: solid/almost completely solid (2)

Echogenicity: isoechoic (1)

Shape: not taller-than-wide (0)

Margins: ill-defined (0)

Echogenic foci: none (0)

ACR TI-RADS total points: 3.

ACR TI-RADS risk category:  TR3 (3 points).

Significant change in size (>/= 20% in two dimensions and minimal
increase of 2 mm): No

Change in features: No

Change in ACR TI-RADS risk category: No

ACR TI-RADS recommendations:

**Given size (>/= 2.5 cm) and appearance, fine needle aspiration of
this mildly suspicious nodule should be considered based on TI-RADS
criteria.

_________________________________________________________

As previously noted, there is a circumscribed complex cystic lesion
in the superficial soft tissues of the midline of the neck separate
from and slightly superior to the thyroid isthmus. This lesion
demonstrates posterior acoustic enhancement consistent with its
complex cystic nature and measures 1.8 x 1.0 x 1.9 cm. This may be
slightly enlarged compared to 1.8 x 1.1 x 1.6 cm previously.
IMPRESSION: 1. Perhaps slight interval enlargement of complex cystic mass in the
midline of the neck separate from and superficial to the thyroid
isthmus. As described on the prior thyroid ultrasound, differential
considerations include complex thyroglossal duct cyst,
sebaceous/epidermoid inclusion cyst and less likely exophytic
thyroid nodule. As before, referral to ENT is recommended for
further assessment and management if not already obtained.
2. Stable 4.7 cm TI-RADS category 3 (mildly suspicious) mass in the
right inferior thyroid gland. As before, this lesion meets criteria
to consider fine-needle aspiration biopsy if not already performed.
3. No new nodules or suspicious features.

The above is in keeping with the ACR TI-RADS recommendations - [HOSPITAL] 5072;[DATE].

## 2022-12-22 IMAGING — DX DG CHEST 1V
1 series · 1 of 1 positions shown · non-contrast
Comparison: Chest x-ray dated 07/30/2016.

CLINICAL DATA: Dyspnea and dizziness.

EXAM:
CHEST  1 VIEW

[chest ap]
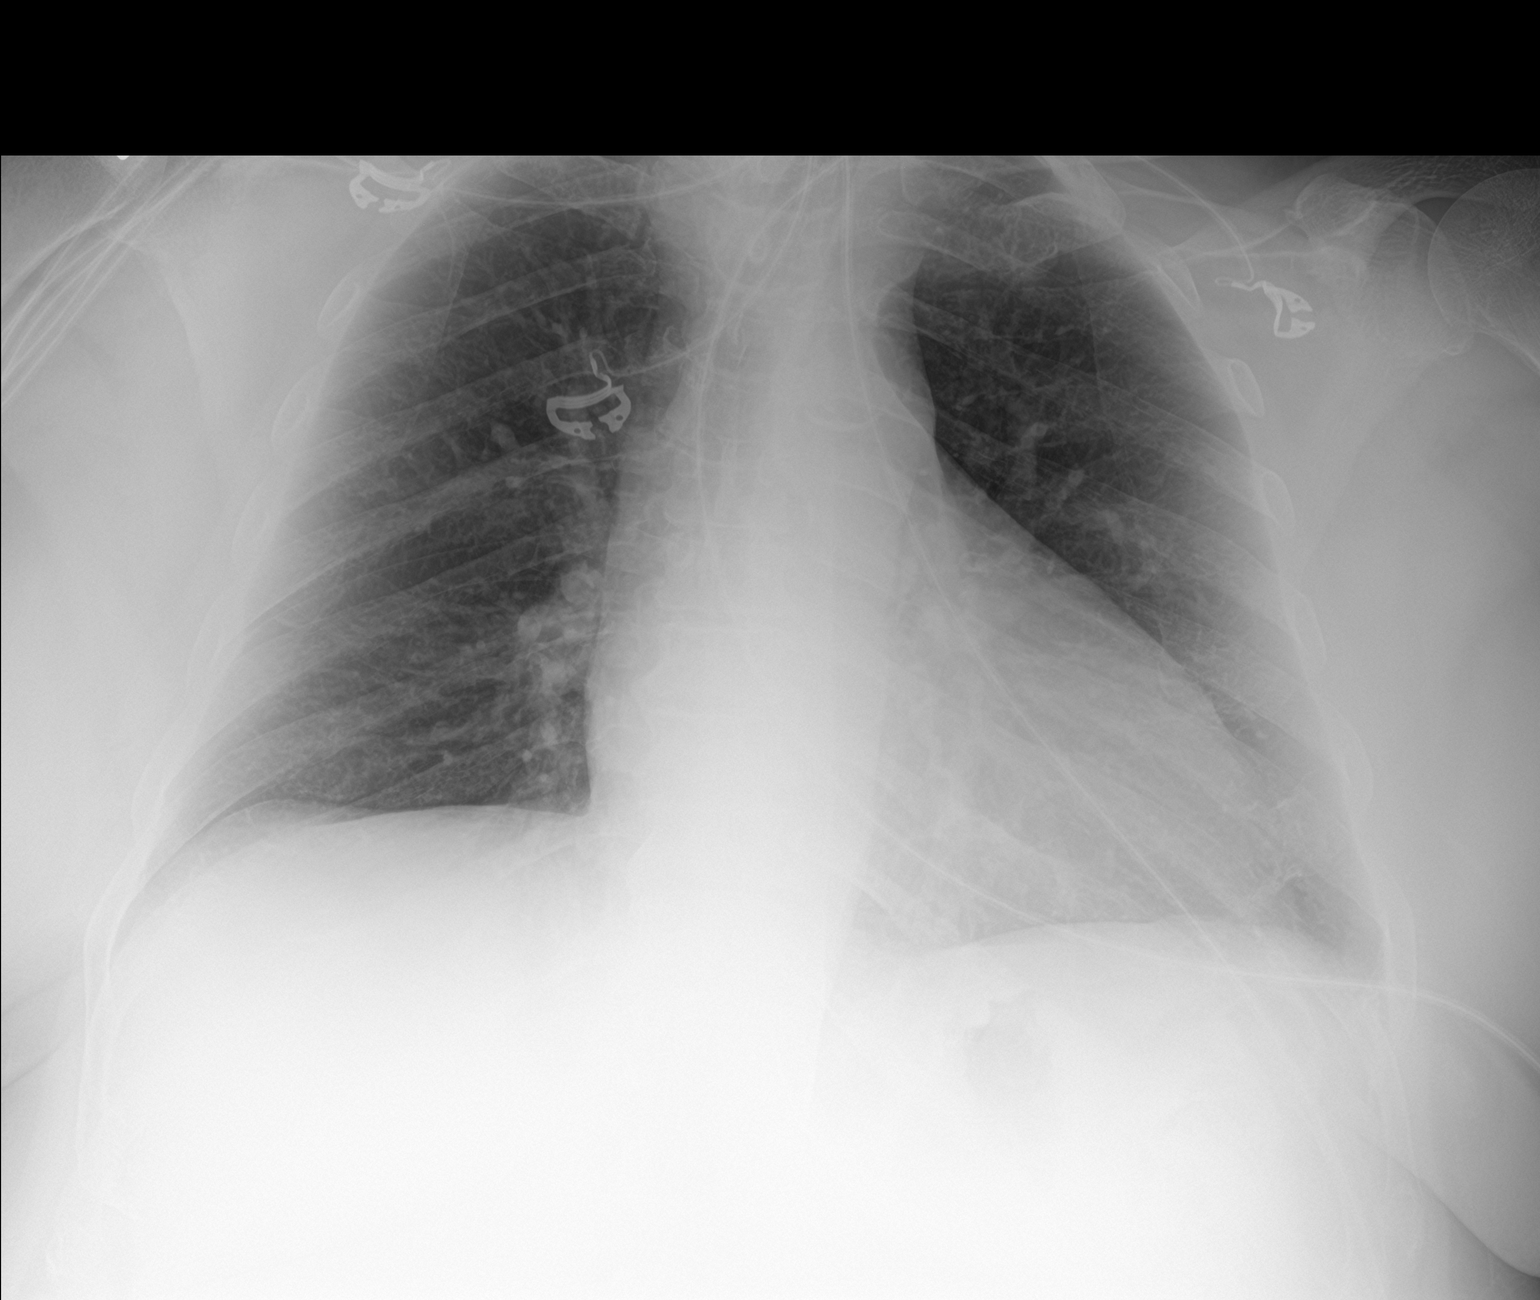

[1 of 1 positions shown; findings below may reference images not displayed]

FINDINGS: Heart size is upper normal. Overall cardiomediastinal silhouette is
within normal limits in size and configuration. Lungs are clear. No
pleural effusion or pneumothorax is seen. Osseous structures about
the chest are unremarkable.
IMPRESSION: No active disease. No evidence of pneumonia or pulmonary edema.

## 2022-12-24 ENCOUNTER — Encounter (HOSPITAL_BASED_OUTPATIENT_CLINIC_OR_DEPARTMENT_OTHER): Payer: Self-pay

## 2022-12-25 ENCOUNTER — Encounter: Payer: Self-pay | Admitting: Podiatry

## 2022-12-25 ENCOUNTER — Other Ambulatory Visit (HOSPITAL_COMMUNITY): Payer: Self-pay

## 2022-12-25 ENCOUNTER — Ambulatory Visit (INDEPENDENT_AMBULATORY_CARE_PROVIDER_SITE_OTHER): Payer: Medicare PPO | Admitting: Podiatry

## 2022-12-25 DIAGNOSIS — M79674 Pain in right toe(s): Secondary | ICD-10-CM

## 2022-12-25 DIAGNOSIS — E0800 Diabetes mellitus due to underlying condition with hyperosmolarity without nonketotic hyperglycemic-hyperosmolar coma (NKHHC): Secondary | ICD-10-CM | POA: Diagnosis not present

## 2022-12-25 DIAGNOSIS — B351 Tinea unguium: Secondary | ICD-10-CM | POA: Diagnosis not present

## 2022-12-25 DIAGNOSIS — Z794 Long term (current) use of insulin: Secondary | ICD-10-CM

## 2022-12-25 DIAGNOSIS — M79675 Pain in left toe(s): Secondary | ICD-10-CM | POA: Diagnosis not present

## 2022-12-25 MED ORDER — LOSARTAN POTASSIUM 25 MG PO TABS
ORAL_TABLET | ORAL | 2 refills | Status: DC
  Filled 2022-12-25: qty 135, 90d supply, fill #0

## 2022-12-25 NOTE — Progress Notes (Signed)

## 2023-01-01 ENCOUNTER — Ambulatory Visit (HOSPITAL_COMMUNITY): Payer: Medicare PPO | Attending: Family

## 2023-01-01 DIAGNOSIS — I493 Ventricular premature depolarization: Secondary | ICD-10-CM | POA: Insufficient documentation

## 2023-01-01 DIAGNOSIS — I5042 Chronic combined systolic (congestive) and diastolic (congestive) heart failure: Secondary | ICD-10-CM

## 2023-01-01 LAB — ECHOCARDIOGRAM COMPLETE
Area-P 1/2: 9.73 cm2
Est EF: 50
S' Lateral: 3.8 cm

## 2023-01-06 ENCOUNTER — Telehealth: Payer: Self-pay | Admitting: Family

## 2023-01-06 NOTE — Telephone Encounter (Signed)
Returned call to patient, advised sleep team is working on approval and will be in touch. Patient very appreciative of the call.

## 2023-01-06 NOTE — Telephone Encounter (Signed)
Pt is requesting a callback regarding the sleep apnea study he has at home since he hasn't heard back on if he should open it or not. Please advise

## 2023-01-09 ENCOUNTER — Other Ambulatory Visit (HOSPITAL_COMMUNITY): Payer: Self-pay

## 2023-01-10 ENCOUNTER — Encounter (HOSPITAL_BASED_OUTPATIENT_CLINIC_OR_DEPARTMENT_OTHER): Payer: Self-pay

## 2023-01-10 NOTE — Telephone Encounter (Signed)
01/10/2023   Call to [facility] prior authorization is not required for CPT 95800.  Ref# [id number].  Jim Like MHA RN CCM

## 2023-01-16 ENCOUNTER — Other Ambulatory Visit: Payer: Self-pay | Admitting: Family Medicine

## 2023-01-16 ENCOUNTER — Other Ambulatory Visit (HOSPITAL_COMMUNITY): Payer: Self-pay

## 2023-01-16 ENCOUNTER — Other Ambulatory Visit: Payer: Self-pay | Admitting: Cardiovascular Disease

## 2023-01-17 ENCOUNTER — Other Ambulatory Visit (HOSPITAL_COMMUNITY): Payer: Self-pay

## 2023-01-17 MED ORDER — FUROSEMIDE 20 MG PO TABS
20.0000 mg | ORAL_TABLET | Freq: Every day | ORAL | 3 refills | Status: DC
  Filled 2023-01-17: qty 90, 90d supply, fill #0

## 2023-01-17 MED ORDER — JARDIANCE 25 MG PO TABS
25.0000 mg | ORAL_TABLET | Freq: Every morning | ORAL | 3 refills | Status: DC
  Filled 2023-01-17 – 2023-04-04 (×2): qty 90, 90d supply, fill #0

## 2023-01-17 MED ORDER — ALBUTEROL SULFATE HFA 108 (90 BASE) MCG/ACT IN AERS
1.0000 | INHALATION_SPRAY | Freq: Four times a day (QID) | RESPIRATORY_TRACT | 3 refills | Status: DC | PRN
  Filled 2023-01-17: qty 6.7, 25d supply, fill #0

## 2023-01-17 MED ORDER — MOUNJARO 2.5 MG/0.5ML ~~LOC~~ SOAJ
2.5000 mg | SUBCUTANEOUS | 2 refills | Status: DC
  Filled 2023-01-17 – 2023-03-19 (×3): qty 2, 28d supply, fill #0
  Filled 2023-04-23: qty 2, 28d supply, fill #1

## 2023-01-17 MED ORDER — INSULIN GLARGINE 100 UNIT/ML SOLOSTAR PEN
36.0000 [IU] | PEN_INJECTOR | Freq: Every evening | SUBCUTANEOUS | 3 refills | Status: DC
  Filled 2023-01-17: qty 12, 75d supply, fill #0
  Filled 2023-04-23: qty 12, 30d supply, fill #0
  Filled 2023-05-19: qty 12, 30d supply, fill #1
  Filled 2023-06-23: qty 12, 30d supply, fill #2
  Filled 2023-07-21: qty 12, 30d supply, fill #3

## 2023-01-17 MED ORDER — LOSARTAN POTASSIUM 25 MG PO TABS
25.0000 mg | ORAL_TABLET | Freq: Three times a day (TID) | ORAL | 2 refills | Status: DC
  Filled 2023-01-17: qty 90, 30d supply, fill #0

## 2023-01-21 ENCOUNTER — Ambulatory Visit (HOSPITAL_BASED_OUTPATIENT_CLINIC_OR_DEPARTMENT_OTHER): Payer: Medicare PPO | Admitting: Family

## 2023-01-21 ENCOUNTER — Telehealth (HOSPITAL_BASED_OUTPATIENT_CLINIC_OR_DEPARTMENT_OTHER): Payer: Self-pay | Admitting: Cardiovascular Disease

## 2023-01-21 NOTE — Telephone Encounter (Signed)
Patient sent mychart message, clarifying that he received information needed for sleep study.

## 2023-01-21 NOTE — Telephone Encounter (Signed)
See previous encounter + patient message.  Patient is following up regarding sleep study authorization. Patient would like a call back to discuss.

## 2023-01-21 NOTE — Telephone Encounter (Signed)
Tried calling, no answer.

## 2023-01-22 ENCOUNTER — Other Ambulatory Visit (HOSPITAL_COMMUNITY): Payer: Self-pay

## 2023-01-22 MED ORDER — PITAVASTATIN CALCIUM 1 MG PO TABS
1.0000 mg | ORAL_TABLET | Freq: Every day | ORAL | 3 refills | Status: DC
  Filled 2023-01-22: qty 90, 90d supply, fill #0

## 2023-01-24 ENCOUNTER — Other Ambulatory Visit (HOSPITAL_COMMUNITY): Payer: Self-pay

## 2023-01-24 ENCOUNTER — Other Ambulatory Visit: Payer: Self-pay | Admitting: Cardiovascular Disease

## 2023-01-27 ENCOUNTER — Other Ambulatory Visit (HOSPITAL_COMMUNITY): Payer: Self-pay

## 2023-01-27 MED ORDER — REPATHA SURECLICK 140 MG/ML ~~LOC~~ SOAJ
140.0000 mg | SUBCUTANEOUS | 11 refills | Status: DC
  Filled 2023-01-27: qty 2, 28d supply, fill #0
  Filled 2023-03-07: qty 2, 28d supply, fill #1
  Filled 2023-04-04: qty 2, 28d supply, fill #2
  Filled 2023-05-19: qty 2, 28d supply, fill #3
  Filled 2023-06-23: qty 2, 28d supply, fill #4
  Filled 2023-07-21: qty 2, 28d supply, fill #5
  Filled 2023-08-18: qty 2, 28d supply, fill #6
  Filled 2023-09-17: qty 2, 28d supply, fill #7
  Filled 2023-10-15: qty 2, 28d supply, fill #8
  Filled 2023-11-17: qty 2, 28d supply, fill #9
  Filled 2023-12-16: qty 2, 28d supply, fill #10

## 2023-01-29 ENCOUNTER — Other Ambulatory Visit (HOSPITAL_COMMUNITY): Payer: Self-pay

## 2023-01-29 MED ORDER — LOSARTAN POTASSIUM 25 MG PO TABS
ORAL_TABLET | ORAL | 3 refills | Status: DC
  Filled 2023-01-29 – 2023-04-04 (×2): qty 135, 90d supply, fill #0

## 2023-02-11 ENCOUNTER — Telehealth (HOSPITAL_BASED_OUTPATIENT_CLINIC_OR_DEPARTMENT_OTHER): Payer: Self-pay

## 2023-02-11 NOTE — Telephone Encounter (Addendum)
2nd call attempt to patient, no answer, unable to leave VM.    ----- Message from Alver Sorrow sent at 02/10/2023  2:22 PM EDT ----- Monitor with predominantly normal sinus rhythm. PVC (early beat in bottom chamber of heart) occurred 9.6% of the time. Occasional episodes of NSVT.  Given frequency of PVC recommend stop carvedilol and start metoprolol tartrate 50 mg twice daily.

## 2023-02-12 ENCOUNTER — Other Ambulatory Visit (HOSPITAL_COMMUNITY): Payer: Self-pay

## 2023-02-12 ENCOUNTER — Encounter (HOSPITAL_BASED_OUTPATIENT_CLINIC_OR_DEPARTMENT_OTHER): Payer: Self-pay

## 2023-02-12 MED ORDER — CARVEDILOL 25 MG PO TABS
25.0000 mg | ORAL_TABLET | Freq: Two times a day (BID) | ORAL | 2 refills | Status: DC
  Filled 2023-02-12: qty 180, 90d supply, fill #0

## 2023-02-12 NOTE — Telephone Encounter (Signed)
3rd call attempt, no answer, unable to leave VM, results mailed to patient with instructions to call upon receipt.

## 2023-02-20 ENCOUNTER — Other Ambulatory Visit (HOSPITAL_COMMUNITY): Payer: Self-pay

## 2023-02-21 ENCOUNTER — Other Ambulatory Visit (HOSPITAL_COMMUNITY): Payer: Self-pay

## 2023-02-26 ENCOUNTER — Other Ambulatory Visit (HOSPITAL_COMMUNITY): Payer: Self-pay

## 2023-02-26 MED ORDER — FREESTYLE LIBRE 3 SENSOR MISC
3 refills | Status: DC
  Filled 2023-02-26 – 2023-02-27 (×2): qty 2, 28d supply, fill #0

## 2023-02-26 MED ORDER — ACCU-CHEK SOFTCLIX LANCETS MISC
3 refills | Status: DC
  Filled 2023-02-26: qty 100, 90d supply, fill #0

## 2023-02-26 MED ORDER — ACCU-CHEK GUIDE W/DEVICE KIT
PACK | 0 refills | Status: DC
  Filled 2023-02-26: qty 1, 30d supply, fill #0

## 2023-02-26 MED ORDER — ACCU-CHEK GUIDE VI STRP
1.0000 | ORAL_STRIP | Freq: Every day | 3 refills | Status: DC
  Filled 2023-02-26: qty 50, 50d supply, fill #0

## 2023-02-27 ENCOUNTER — Other Ambulatory Visit (HOSPITAL_COMMUNITY): Payer: Self-pay

## 2023-03-03 ENCOUNTER — Other Ambulatory Visit (HOSPITAL_COMMUNITY): Payer: Self-pay

## 2023-03-04 ENCOUNTER — Other Ambulatory Visit (HOSPITAL_COMMUNITY): Payer: Self-pay

## 2023-03-07 ENCOUNTER — Other Ambulatory Visit: Payer: Self-pay

## 2023-03-07 ENCOUNTER — Other Ambulatory Visit (HOSPITAL_COMMUNITY): Payer: Self-pay

## 2023-03-14 ENCOUNTER — Other Ambulatory Visit (HOSPITAL_COMMUNITY): Payer: Self-pay

## 2023-03-19 ENCOUNTER — Other Ambulatory Visit (HOSPITAL_COMMUNITY): Payer: Self-pay

## 2023-03-25 ENCOUNTER — Other Ambulatory Visit (HOSPITAL_COMMUNITY): Payer: Self-pay

## 2023-03-27 ENCOUNTER — Ambulatory Visit: Payer: Medicare PPO | Admitting: Podiatry

## 2023-03-28 ENCOUNTER — Ambulatory Visit (INDEPENDENT_AMBULATORY_CARE_PROVIDER_SITE_OTHER): Payer: Medicare PPO | Admitting: Podiatry

## 2023-03-28 ENCOUNTER — Encounter: Payer: Self-pay | Admitting: Podiatry

## 2023-03-28 VITALS — Ht 66.0 in | Wt 225.1 lb

## 2023-03-28 DIAGNOSIS — Z794 Long term (current) use of insulin: Secondary | ICD-10-CM | POA: Diagnosis not present

## 2023-03-28 DIAGNOSIS — G629 Polyneuropathy, unspecified: Secondary | ICD-10-CM | POA: Insufficient documentation

## 2023-03-28 DIAGNOSIS — M79675 Pain in left toe(s): Secondary | ICD-10-CM

## 2023-03-28 DIAGNOSIS — E0800 Diabetes mellitus due to underlying condition with hyperosmolarity without nonketotic hyperglycemic-hyperosmolar coma (NKHHC): Secondary | ICD-10-CM

## 2023-03-28 DIAGNOSIS — M79674 Pain in right toe(s): Secondary | ICD-10-CM | POA: Diagnosis not present

## 2023-03-28 DIAGNOSIS — B351 Tinea unguium: Secondary | ICD-10-CM | POA: Diagnosis not present

## 2023-03-28 NOTE — Progress Notes (Signed)
.  This patient returns to my office for at risk foot care.  This patient requires this care by a professional since this patient will be at risk due to having diabetes.   This patient is unable to cut nails himself since the patient cannot reach his nails.These nails are painful walking and wearing shoes.  He is also concerned about his ankle pain.  Finally he is concerned about his neuropathy .  He is concerned about the symptoms since he has both diabetes and CVA. This patient presents for at risk foot care today.  General Appearance  Alert, conversant and in no acute stress.  Vascular  Dorsalis pedis  pulses are weakly palpable. His posterior tibial  pulses are palpable  bilaterally.  Capillary return is within normal limits  bilaterally. Temperature is within normal limits  bilaterally.  Neurologic  Senn-Weinstein monofilament wire test within normal limits  bilaterally. Muscle power within normal limits bilaterally.  Nails Thick disfigured discolored nails with subungual debris  from hallux to fifth toes bilaterally. No evidence of bacterial infection or drainage bilaterally.  Orthopedic  No limitations of motion  feet .  No crepitus or effusions noted.  No bony pathology or digital deformities noted.  Skin  normotropic skin with no porokeratosis noted bilaterally.  No signs of infections or ulcers noted.     Onychomycosis  Pain in right toes  Pain in left toes  Consent was obtained for treatment procedures.   Mechanical debridement of nails 1-5  bilaterally performed with a nail nipper.  Filed with dremel without incident.    Return office visit     3 months                 Told patient to return for periodic foot care and evaluation due to potential at risk complications. Patient to see Dr.  Allena Katz concerning his ankles.  He said he will discuss the neuropathy with his PCP.  Senn weinstein test was performed and found to be  WNL.   Helane Gunther DPM

## 2023-04-04 ENCOUNTER — Other Ambulatory Visit (HOSPITAL_COMMUNITY): Payer: Self-pay

## 2023-04-23 ENCOUNTER — Other Ambulatory Visit (HOSPITAL_COMMUNITY): Payer: Self-pay

## 2023-04-25 ENCOUNTER — Ambulatory Visit (INDEPENDENT_AMBULATORY_CARE_PROVIDER_SITE_OTHER): Payer: Medicare PPO | Admitting: Family

## 2023-04-25 ENCOUNTER — Other Ambulatory Visit: Payer: Self-pay

## 2023-04-25 ENCOUNTER — Encounter (HOSPITAL_BASED_OUTPATIENT_CLINIC_OR_DEPARTMENT_OTHER): Payer: Self-pay | Admitting: Family

## 2023-04-25 ENCOUNTER — Other Ambulatory Visit (HOSPITAL_COMMUNITY): Payer: Self-pay

## 2023-04-25 VITALS — BP 126/74 | HR 81 | Ht 66.0 in | Wt 228.2 lb

## 2023-04-25 DIAGNOSIS — I493 Ventricular premature depolarization: Secondary | ICD-10-CM

## 2023-04-25 DIAGNOSIS — R600 Localized edema: Secondary | ICD-10-CM | POA: Diagnosis not present

## 2023-04-25 DIAGNOSIS — Z8673 Personal history of transient ischemic attack (TIA), and cerebral infarction without residual deficits: Secondary | ICD-10-CM

## 2023-04-25 DIAGNOSIS — G4733 Obstructive sleep apnea (adult) (pediatric): Secondary | ICD-10-CM | POA: Diagnosis not present

## 2023-04-25 DIAGNOSIS — E785 Hyperlipidemia, unspecified: Secondary | ICD-10-CM

## 2023-04-25 MED ORDER — TIRZEPATIDE 2.5 MG/0.5ML ~~LOC~~ SOAJ
2.5000 mg | SUBCUTANEOUS | 3 refills | Status: DC
  Filled 2023-04-25: qty 2, 28d supply, fill #0
  Filled 2023-05-19: qty 2, 28d supply, fill #1
  Filled 2023-06-23: qty 2, 28d supply, fill #2
  Filled 2023-07-21: qty 2, 28d supply, fill #3

## 2023-04-25 MED ORDER — METOPROLOL TARTRATE 50 MG PO TABS
50.0000 mg | ORAL_TABLET | Freq: Two times a day (BID) | ORAL | 3 refills | Status: DC
  Filled 2023-04-25: qty 180, 90d supply, fill #0
  Filled 2023-09-05: qty 180, 90d supply, fill #1
  Filled 2023-12-16: qty 180, 90d supply, fill #2

## 2023-04-25 NOTE — Progress Notes (Signed)
Cardiology Office Note:  .   Date:  04/25/2023  ID:  Thomas Gardner, DOB [birth year], MRN [id number] PCP: Ellyn Hack, MD  [facility] Providers Cardiologist:  Chilton Si, MD    History of Present Illness: .   Thomas Gardner is a 67 y.o. 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   Myoview 2013 LVEF 37%, no ischemia.  Echo 11/2020 LVEF 55-40%, grade 1 diastolic dysfunction.  Admitted 02/2021 for acute ischemic stroke of left thalamic pyramid.  He severe stenosis proximal left external carotid artery and multifocal moderate to severe stenosis of the right posterior cerebral artery.  Treated with aspirin Plavix for 3 months then aspirin alone.  Echo on admission LVEF 50%.  Required diuretics during hospitalization for diuresis.  Losartan previously increased for BP control.  He also has displayed multiple of his medications to reduce lightheadedness, dizziness.  At last visit 08/2021 he was recommended to start rosuvastatin 3 times per week and continue Repatha.  Seen 12/10/22 and 7 day clinic pursued to assess PVC burden. Monitor with 9.6% PVC burden and was recommended to stop Carvedilol, start Metoprolol tartrate - he was unable to be reached with these recommendations despite leaving 3 voicemails. Echo due to LE edema 01/01/23 stable LVEF 50% with no significant valvular abnormalities. Itamar home sleep study ordered as did not tolerate CPAP but interested in alternate therapies, but he did not complete.  Presents today for follow up. Weight up 3 lbs from clinic visit 5 months ago. 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 Notes still having frequent PVC symptomatic with hearing his heartbeat in his ear.  Also notes they make him feel very anxious. He did not complete Itamar sleep study, but does still have and plans to complete. Reports occasional lightheadedness but not as frequent. Has adjusted to take some meds in morning and evening instead of everything int he morning. Has BP cuff at home and checking periodically but is uncertain of its accuracy as reading 140s/80s which is higher than what he gets at doctor visits.   ROS: Please see the history of present illness.    All other systems reviewed and are negative.   Studies Reviewed: .        Cardiac Studies & Procedures     STRESS TESTS  MYOCARDIAL PERFUSION IMAGING 08/15/2011   ECHOCARDIOGRAM  ECHOCARDIOGRAM COMPLETE 01/01/2023  Narrative ECHOCARDIOGRAM REPORT    Patient Name:   Thomas Gardner Date of Exam: 01/01/2023 Medical Rec #:  [id number]       Height:       66.0 in Accession #:    [id number]      Weight:       225.1 lb Date of Birth:  10-Nov-1955       BSA:          2.103 m Patient Age:    67 years        BP:           170/88 mmHg Patient Gender: M               HR:           71 bpm. Exam Location:  [address]  Procedure: 2D Echo, 3D Echo, Cardiac Doppler, Color Doppler and Strain Analysis  Indications:    I50.9 CHF  History:        Patient has prior history of Echocardiogram examinations, most recent 03/04/2021. Risk Factors:Hypertension, HLD and Diabetes.  Sonographer:    Clearence Ped RCS Referring Phys: [phone] Dewell Monnier S Daenerys Buttram  IMPRESSIONS   1. Left ventricular ejection fraction, by estimation, is 50%. The left ventricle has normal function. The left ventricle has no regional wall motion abnormalities. Left ventricular diastolic parameters are consistent with Grade I diastolic dysfunction (impaired relaxation). 2. Right ventricular systolic function is normal. The right ventricular size is normal. 3. Left atrial size was mildly dilated. 4. The mitral valve is normal in structure. Trivial mitral valve  regurgitation. No evidence of mitral stenosis. 5. The aortic valve is tricuspid. There is mild calcification of the aortic valve. Aortic valve regurgitation is not visualized. No aortic stenosis is present. 6. The inferior vena cava is normal in size with greater than 50% respiratory variability, suggesting right atrial pressure of 3 mmHg.  FINDINGS Left Ventricle: Left ventricular ejection fraction, by estimation, is 50%. The left ventricle has normal function. The left ventricle has no regional wall motion abnormalities. The left ventricular internal cavity size was normal in size. There is no left ventricular hypertrophy. Left ventricular diastolic parameters are consistent with Grade I diastolic dysfunction (impaired relaxation).  Right Ventricle: The right ventricular size is normal. No increase in right ventricular wall thickness. Right ventricular systolic function is normal.  Left Atrium: Left atrial size was mildly dilated.  Right Atrium: Right atrial size was normal in size.  Pericardium: There is no evidence of pericardial effusion.  Mitral Valve: The mitral valve is normal in structure. Trivial mitral valve regurgitation. No evidence of mitral valve stenosis.  Tricuspid Valve: The tricuspid valve is normal in structure. Tricuspid valve regurgitation is trivial. No evidence of tricuspid stenosis.  Aortic Valve: The aortic valve is tricuspid. There is mild calcification of the aortic valve. Aortic valve regurgitation is not visualized. No aortic stenosis is present.  Pulmonic Valve: The pulmonic valve was normal in structure. Pulmonic valve regurgitation is trivial. No evidence of pulmonic stenosis.  Aorta: The aortic root is normal in size and structure.  Venous: The inferior vena cava is normal in size with greater than 50% respiratory variability, suggesting right atrial pressure of 3 mmHg.  IAS/Shunts: No atrial level shunt detected by color flow Doppler.   LEFT  VENTRICLE PLAX 2D LVIDd:         5.10 cm Diastology LVIDs:         3.80 cm LV e' medial:    4.35 cm/s LV PW:         0.90 cm LV E/e' medial:  16.7 LV IVS:        0.70 cm LV e' lateral:   5.33 cm/s LV E/e' lateral: 13.7  2D Longitudinal Strain 2D Strain GLS (A2C):   -18.1 % 2D Strain GLS (A3C):   -15.5 % 2D Strain GLS (A4C):   -16.8 % 2D Strain GLS Avg:     -16.8 %  3D Volume EF: 3D EF:        50 % LV EDV:       165 ml LV ESV:       83 ml LV SV:        82 ml  RIGHT VENTRICLE RV Basal diam:  3.20 cm RV S prime:     14.30 cm/s TAPSE (M-mode): 2.2 cm  LEFT ATRIUM  Index        RIGHT ATRIUM           Index LA diam:        4.60 cm 2.19 cm/m   RA Area:     11.00 cm LA Vol (A2C):   58.9 ml 28.01 ml/m  RA Volume:   25.20 ml  11.98 ml/m LA Vol (A4C):   51.6 ml 24.54 ml/m LA Biplane Vol: 56.1 ml 26.68 ml/m AORTIC VALVE LVOT Vmax:   85.40 cm/s LVOT Vmean:  60.700 cm/s LVOT VTI:    0.182 m  AORTA Ao Root diam: 3.70 cm Ao Asc diam:  3.40 cm  MITRAL VALVE MV Area (PHT):              SHUNTS MV Decel Time:              Systemic VTI: 0.18 m MV E velocity: 72.80 cm/s MV A velocity: 108.00 cm/s MV E/A ratio:  0.67  Arvilla Meres MD Electronically signed by Arvilla Meres MD Signature Date/Time: 01/01/2023/4:05:51 PM    Final    MONITORS  LONG TERM MONITOR (3-14 DAYS) 12/30/2022  Narrative 7 Day Zio Monitor  Quality: Fair.  Baseline artifact. Predominant rhythm: sinus rhythm Average heart rate: 85 bpm Max heart rate: 128 bpm Min heart rate: 59 bpm Pauses >2.5 seconds: none  Rare PACs (<1%) Frequent PVCs (9.6%) Rare ventricular couplets and triplets (<1%) Up to 13 beats NSVT Up to 6 beats SVT   Tiffany C. Duke Salvia, MD, [facility] 02/09/2023 8:40 AM           Risk Assessment/Calculations:         STOP-Bang Score:  7      Physical Exam:   VS:  BP 126/74   Pulse 81   Ht 5\' 6"  (1.676 m)   Wt 228 lb 3.2 oz (103.5 kg)   SpO2 95%   BMI 36.83  kg/m    Wt Readings from Last 3 Encounters:  04/25/23 228 lb 3.2 oz (103.5 kg)  03/28/23 225 lb 1.6 oz (102.1 kg)  12/10/22 225 lb 1.6 oz (102.1 kg)    GEN: Well nourished, overweight, well developed in no acute distress NECK: No JVD; No carotid bruits CARDIAC: RRR, no murmurs, rubs, gallops RESPIRATORY:  Clear to auscultation without rales, wheezing or rhonchi  ABDOMEN: Soft, non-tender, non-distended EXTREMITIES:  Trace bilateral pretibial edema.; No deformity   ASSESSMENT AND PLAN: .    PVC - Monitor 12/2021 with PVC burden 9.6%. Symptomatic with PVC.  Stop Carvedilol, start Metoprolol tartrate 50mg  BID. Will request recent labs from PCP.  Hx of CVA - Continue aspirin, rosuvastatin, repatha. Will request recent labs from PCP.  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 GDMT Metoprolol, lasix, jardiance. NYHA I. Low sodium diet, fluid restriction <2L, and daily weights encouraged. Educated to contact our office for weight gain of 2 lbs overnight or 5 lbs in one week.   Familial hyperlipidemia - Continue Repatha, Rosuvastatin. Will request lipid panel from PCP.  OSA - Snores, stops breathing, daytime somnolence. Previously did not tolerate CPAP many years ago.  Interested in trying newer CPAP vs inspire device.  Itamar home sleep study previously provided, he was given PIN [id number] today and will complete       Dispo: follow up in 6 months with Dr. Duke Salvia or Alver Sorrow, NP   Signed, Alver Sorrow, NP

## 2023-04-25 NOTE — Patient Instructions (Signed)
Medication Instructions:  Your physician has recommended you make the following change in your medication:   STOP Carvedilol START Metoprolol Tartrate 50 mg twice a day  *If you need a refill on your cardiac medications before your next appointment, please call your pharmacy*  Follow-Up: At [facility], you and your health needs are our priority.  As part of our continuing mission to provide you with exceptional heart care, we have created designated Provider Care Teams.  These Care Teams include your primary Cardiologist (physician) and Advanced Practice Providers (APPs -  Physician Assistants and Nurse Practitioners) who all work together to provide you with the care you need, when you need it.  We recommend signing up for the patient portal called "MyChart".  Sign up information is provided on this After Visit Summary.  MyChart is used to connect with patients for Virtual Visits (Telemedicine).  Patients are able to view lab/test results, encounter notes, upcoming appointments, etc.  Non-urgent messages can be sent to your provider as well.   To learn more about what you can do with MyChart, go to [email or web address].    Your next appointment:   6 month(s)  Provider:   Chilton Si, MD or Gillian Shields, NP

## 2023-05-02 ENCOUNTER — Encounter: Payer: Self-pay | Admitting: Podiatry

## 2023-05-02 ENCOUNTER — Ambulatory Visit (INDEPENDENT_AMBULATORY_CARE_PROVIDER_SITE_OTHER): Payer: Medicare PPO

## 2023-05-02 ENCOUNTER — Ambulatory Visit (INDEPENDENT_AMBULATORY_CARE_PROVIDER_SITE_OTHER): Payer: Medicare PPO | Admitting: Podiatry

## 2023-05-02 ENCOUNTER — Other Ambulatory Visit (HOSPITAL_COMMUNITY): Payer: Self-pay

## 2023-05-02 DIAGNOSIS — M778 Other enthesopathies, not elsewhere classified: Secondary | ICD-10-CM | POA: Diagnosis not present

## 2023-05-02 DIAGNOSIS — M10072 Idiopathic gout, left ankle and foot: Secondary | ICD-10-CM

## 2023-05-02 MED ORDER — COLCHICINE 0.6 MG PO TABS
0.6000 mg | ORAL_TABLET | Freq: Every day | ORAL | 0 refills | Status: DC
  Filled 2023-05-02: qty 30, 30d supply, fill #0

## 2023-05-02 NOTE — Progress Notes (Signed)
Subjective:  Patient ID: Thomas Gardner, male    DOB: [birth year],  MRN: [id number]  Chief Complaint  Patient presents with   Foot Pain    Patient states his left foot has redness on the side of his left hallux , patient has a lot of pain where is joint is on left hallux , patient states it has gone down , patient will like a letter so he can stay off his foot for a few days . Patient states he is diabetic. Patient takes tylenol for pain . Patient states his ankle is swollen .    67 y.o. male presents with the above complaint.  Patient presents for left first metatarsophalangeal joint pain.  Patient states it came out of nowhere has been hurting him a lot is doing a little bit better today.  He had red hot swollen joint.  He had an intake and red meat that could have led to it.  He denies seeing anyone else prior to seeing me for this pain scale 7 out of 10 dull aching nature hurts with ambulation worse with pressure   Review of Systems: Negative except as noted in the HPI. Denies N/V/F/Ch.  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   nueruologist---- dr Pearlean Brownie;    04-10-2022  per pt residul at times balance impaired   Arthritis    back shoulders and legs   Asthma    Bilateral lower extremity edema    @ times, goes away with legs elevated   CHF (congestive heart failure) (HCC)    Complication of anesthesia    woke up with endoscopy yrs ago   Diabetes mellitus type 2, insulin dependent (HCC) 2003   Does mobilize using cane    Familial hyperlipidemia 05/07/2021   GERD (gastroesophageal reflux disease)    History of hiatal hernia    Hyperlipidemia    Hypertension    OSA (obstructive sleep apnea)    did not tolerate cpap, uses pillow for elevation   Prostate cancer (HCC)     Current Outpatient Medications:    Accu-Chek Softclix Lancets lancets, Use to test blood sugar levels daily., Disp: 100 each, Rfl: 3   acetaminophen (TYLENOL)  325 MG tablet, Take 1-2 tablets (325-650 mg total) by mouth every 4 (four) hours as needed for mild pain., Disp: , Rfl:    albuterol (VENTOLIN HFA) 108 (90 Base) MCG/ACT inhaler, Inhale 1-2 puffs into the lungs every 6 (six) hours as needed., Disp: 9 g, Rfl: 3   ascorbic acid (VITAMIN C) 500 MG tablet, Take 1 tablet (500 mg total) by mouth daily., Disp: 30 tablet, Rfl: 0   aspirin EC 81 MG tablet, Take 1 tablet (81 mg total) by mouth daily. Swallow whole., Disp: 90 tablet, Rfl: 3   Blood Glucose Monitoring Suppl (ACCU-CHEK GUIDE) w/Device KIT, use as directed, Disp: 1 kit, Rfl: 0   chlorthalidone (HYGROTON) 25 MG tablet, Take 1 tablet (25 mg total) by mouth daily., Disp: 90 tablet, Rfl: 3   colchicine 0.6 MG tablet, Take 1 tablet (0.6 mg total) by mouth daily., Disp: 30 tablet, Rfl: 0   Continuous Glucose Sensor (FREESTYLE LIBRE 3 SENSOR) MISC, Use as directed for blood sugar monitoring at least three times daily and as needed, Disp: 6 each, Rfl: 3   empagliflozin (JARDIANCE) 25 MG TABS tablet, Take 1 tablet (25 mg total) by mouth daily in the morning, Disp: 90 tablet, Rfl: 3   Evolocumab (  REPATHA SURECLICK) 140 MG/ML SOAJ, Inject 140 mg into the skin every 14 (fourteen) days. Please keep your upcoming appointment for refills., Disp: 2 mL, Rfl: 11   furosemide (LASIX) 20 MG tablet, Take 10 mg by mouth daily., Disp: , Rfl:    glucose blood (ACCU-CHEK GUIDE) test strip, Use 1 to test blood sugar levels everyday., Disp: 300 each, Rfl: 3   insulin glargine (LANTUS SOLOSTAR) 100 UNIT/ML Solostar Pen, Inject 16 Units into the skin 2 (two) times daily. (Patient taking differently: Inject 16 Units into the skin 2 (two) times daily.), Disp: 12 mL, Rfl: 11   insulin glargine (LANTUS) 100 UNIT/ML Solostar Pen, Inject 16 units in the morning and 20 Units into the skin at bedtime., Disp: 12 mL, Rfl: 3   losartan (COZAAR) 25 MG tablet, Take 1 tablet (25 mg total) by mouth every morning AND 1/2 tablet (12.5 mg total)  at bedtime., Disp: 135 tablet, Rfl: 3   metoprolol tartrate (LOPRESSOR) 50 MG tablet, Take 1 tablet (50 mg total) by mouth 2 (two) times daily., Disp: 180 tablet, Rfl: 3   omeprazole (PRILOSEC) 40 MG capsule, Take 40 mg by mouth as needed., Disp: , Rfl:    OVER THE COUNTER MEDICATION, Take 2 Scoops by mouth daily. [place], Disp: , Rfl:    Pitavastatin Calcium 1 MG TABS, Take 1 tablet (1 mg total) by mouth at bedtime., Disp: 90 tablet, Rfl: 3   tirzepatide (MOUNJARO) 2.5 MG/0.5ML Pen, Inject 2.5 mg into the skin once a week., Disp: 2 mL, Rfl: 3   Vitamin D3 (VITAMIN D) 25 MCG tablet, Take 1 tablet (1,000 Units total) by mouth daily., Disp: 30 tablet, Rfl: 0 No current facility-administered medications for this visit.  Facility-Administered Medications Ordered in Other Visits:    sodium phosphate (FLEET) 7-19 GM/118ML enema 1 enema, 1 enema, Rectal, Once, Crist Fat, MD  Social History   Tobacco Use  Smoking Status Never  Smokeless Tobacco Never    Allergies  Allergen Reactions   Erythromycin Nausea And Vomiting   Objective:  There were no vitals filed for this visit. There is no height or weight on file to calculate BMI. Constitutional Well developed. Well nourished.  Vascular Dorsalis pedis pulses palpable bilaterally. Posterior tibial pulses palpable bilaterally. Capillary refill normal to all digits.  No cyanosis or clubbing noted. Pedal hair growth normal.  Neurologic Normal speech. Oriented to person, place, and time. Epicritic sensation to light touch grossly present bilaterally.  Dermatologic Nails well groomed and normal in appearance. No open wounds. No skin lesions.  Orthopedic: Pain on palpation left first metatarsophalangeal joint pain with range of motion of the joint no deep intra-articular pain noted.  Red hot swollen joint noted   Radiographs: 3 views of skeletally mature adult noted: No fractures noted.  No bony abnormalities noted.  Bipartite  sesamoid of the medial sesamoid noted.  No other bony abnormalities identified Assessment:   1. Acute idiopathic gout involving toe of left foot   2. Capsulitis of foot, left    Plan:  Patient was evaluated and treated and all questions answered.  Left first metatarsophalangeal joint capsulitis with underlying gout flare -All questions and concerns were discussed with the patient in extensive detail given the amount of pain that he is having he will benefit from steroid injection of decrease inflammatory component surgical pain.  Patient agrees with plan we will proceed with steroid injection -A steroid injection was performed at left first MTP using 1% plain Lidocaine  and 10 mg of Kenalog. This was well tolerated. -I discussed diet management and extensive detail -Shoe gear modification discussed   No follow-ups on file.

## 2023-05-15 ENCOUNTER — Encounter: Payer: Self-pay | Admitting: Podiatry

## 2023-05-19 ENCOUNTER — Other Ambulatory Visit: Payer: Self-pay

## 2023-05-19 ENCOUNTER — Other Ambulatory Visit (HOSPITAL_COMMUNITY): Payer: Self-pay

## 2023-06-23 ENCOUNTER — Other Ambulatory Visit (HOSPITAL_COMMUNITY): Payer: Self-pay

## 2023-07-02 ENCOUNTER — Encounter: Payer: Self-pay | Admitting: Podiatry

## 2023-07-02 ENCOUNTER — Ambulatory Visit (INDEPENDENT_AMBULATORY_CARE_PROVIDER_SITE_OTHER): Payer: Medicare PPO | Admitting: Podiatry

## 2023-07-02 VITALS — Ht 66.0 in | Wt 228.2 lb

## 2023-07-02 DIAGNOSIS — E0800 Diabetes mellitus due to underlying condition with hyperosmolarity without nonketotic hyperglycemic-hyperosmolar coma (NKHHC): Secondary | ICD-10-CM

## 2023-07-02 DIAGNOSIS — Z794 Long term (current) use of insulin: Secondary | ICD-10-CM | POA: Diagnosis not present

## 2023-07-02 DIAGNOSIS — B351 Tinea unguium: Secondary | ICD-10-CM

## 2023-07-02 DIAGNOSIS — M79674 Pain in right toe(s): Secondary | ICD-10-CM | POA: Diagnosis not present

## 2023-07-02 DIAGNOSIS — M79675 Pain in left toe(s): Secondary | ICD-10-CM

## 2023-07-02 NOTE — Progress Notes (Signed)
.  This patient returns to my office for at risk foot care.  This patient requires this care by a professional since this patient will be at risk due to having diabetes.   This patient is unable to cut nails himself since the patient cannot reach his nails.These nails are painful walking and wearing shoes.  He is also concerned about his ankle pain.  Finally he is concerned about his neuropathy .  He is concerned about the symptoms since he has both diabetes and CVA. This patient presents for at risk foot care today.  General Appearance  Alert, conversant and in no acute stress.  Vascular  Dorsalis pedis  pulses are weakly palpable. His posterior tibial  pulses are palpable  bilaterally.  Capillary return is within normal limits  bilaterally. Temperature is within normal limits  bilaterally.  Neurologic  Senn-Weinstein monofilament wire test within normal limits  bilaterally. Muscle power within normal limits bilaterally.  Nails Thick disfigured discolored nails with subungual debris  from hallux to fifth toes bilaterally. No evidence of bacterial infection or drainage bilaterally.  Orthopedic  No limitations of motion  feet .  No crepitus or effusions noted.  No bony pathology or digital deformities noted.  Skin  normotropic skin with no porokeratosis noted bilaterally.  No signs of infections or ulcers noted.     Onychomycosis  Pain in right toes  Pain in left toes  Consent was obtained for treatment procedures.   Mechanical debridement of nails 1-5  bilaterally performed with a nail nipper.  Filed with dremel without incident.    Return office visit     3 months                 Told patient to return for periodic foot care and evaluation due to potential at risk complications.  He said he will discuss the neuropathy with his PCP.  Senn weinstein test was performed and found to be  WNL.   Helane Gunther DPM

## 2023-07-04 ENCOUNTER — Other Ambulatory Visit (HOSPITAL_COMMUNITY): Payer: Self-pay

## 2023-07-07 ENCOUNTER — Other Ambulatory Visit (HOSPITAL_COMMUNITY): Payer: Self-pay

## 2023-07-14 ENCOUNTER — Other Ambulatory Visit (HOSPITAL_COMMUNITY): Payer: Self-pay

## 2023-07-21 ENCOUNTER — Other Ambulatory Visit (HOSPITAL_COMMUNITY): Payer: Self-pay

## 2023-07-22 ENCOUNTER — Other Ambulatory Visit (HOSPITAL_COMMUNITY): Payer: Self-pay

## 2023-08-01 ENCOUNTER — Other Ambulatory Visit: Payer: Self-pay

## 2023-08-01 ENCOUNTER — Other Ambulatory Visit (HOSPITAL_COMMUNITY): Payer: Self-pay

## 2023-08-01 ENCOUNTER — Ambulatory Visit: Payer: Medicare PPO | Admitting: Podiatry

## 2023-08-01 ENCOUNTER — Encounter: Payer: Self-pay | Admitting: Podiatry

## 2023-08-01 VITALS — BP 174/90

## 2023-08-01 DIAGNOSIS — Z794 Long term (current) use of insulin: Secondary | ICD-10-CM

## 2023-08-01 DIAGNOSIS — M792 Neuralgia and neuritis, unspecified: Secondary | ICD-10-CM | POA: Diagnosis not present

## 2023-08-01 DIAGNOSIS — E0800 Diabetes mellitus due to underlying condition with hyperosmolarity without nonketotic hyperglycemic-hyperosmolar coma (NKHHC): Secondary | ICD-10-CM

## 2023-08-01 MED ORDER — PREGABALIN 25 MG PO CAPS
25.0000 mg | ORAL_CAPSULE | Freq: Two times a day (BID) | ORAL | 0 refills | Status: DC
  Filled 2023-08-01: qty 60, 30d supply, fill #0

## 2023-08-01 MED ORDER — COLCHICINE 0.6 MG PO TABS
0.6000 mg | ORAL_TABLET | Freq: Every day | ORAL | 0 refills | Status: DC
  Filled 2023-08-01: qty 60, 60d supply, fill #0

## 2023-08-01 NOTE — Progress Notes (Signed)
 Subjective:  Patient ID: Thomas Gardner, male    DOB: 04/18/1956,  MRN: [id number]  Chief Complaint  Patient presents with   Foot Pain    Pt stated that he still has some discomfort in his feet he feels like it is neuropathy     68 y.o. male presents with the above complaint.  Patient presents with neuropathic pain mostly to the left side.  She wanted to get it evaluated.  Both of her feet really cause her neuropathy pain she has not seen MRIs prior to seeing me she has tried gabapentin in the past which has not helped she would like to know if she can try another medication denies any other acute issues pain scale is 5 out of 10 dull achy in nature she is   Review of Systems: Negative except as noted in the HPI. Denies N/V/F/Ch.  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   nueruologist---- dr Pearlean Brownie;    04-10-2022  per pt residul at times balance impaired   Arthritis    back shoulders and legs   Asthma    Bilateral lower extremity edema    @ times, goes away with legs elevated   CHF (congestive heart failure) (HCC)    Complication of anesthesia    woke up with endoscopy yrs ago   Diabetes mellitus type 2, insulin dependent (HCC) 2003   Does mobilize using cane    Familial hyperlipidemia 05/07/2021   GERD (gastroesophageal reflux disease)    History of hiatal hernia    Hyperlipidemia    Hypertension    OSA (obstructive sleep apnea)    did not tolerate cpap, uses pillow for elevation   Prostate cancer (HCC)     Current Outpatient Medications:    colchicine 0.6 MG tablet, Take 1 tablet (0.6 mg total) by mouth daily., Disp: 60 tablet, Rfl: 0   pregabalin (LYRICA) 25 MG capsule, Take 1 capsule (25 mg total) by mouth 2 (two) times daily., Disp: 60 capsule, Rfl: 0   Accu-Chek Softclix Lancets lancets, Use to test blood sugar levels daily., Disp: 100 each, Rfl: 3   acetaminophen (TYLENOL) 325 MG tablet, Take 1-2 tablets (325-650 mg  total) by mouth every 4 (four) hours as needed for mild pain., Disp: , Rfl:    albuterol (VENTOLIN HFA) 108 (90 Base) MCG/ACT inhaler, Inhale 1-2 puffs into the lungs every 6 (six) hours as needed., Disp: 9 g, Rfl: 3   ascorbic acid (VITAMIN C) 500 MG tablet, Take 1 tablet (500 mg total) by mouth daily., Disp: 30 tablet, Rfl: 0   aspirin EC 81 MG tablet, Take 1 tablet (81 mg total) by mouth daily. Swallow whole., Disp: 90 tablet, Rfl: 3   Blood Glucose Monitoring Suppl (ACCU-CHEK GUIDE) w/Device KIT, use as directed, Disp: 1 kit, Rfl: 0   chlorthalidone (HYGROTON) 25 MG tablet, Take 1 tablet (25 mg total) by mouth daily., Disp: 90 tablet, Rfl: 3   colchicine 0.6 MG tablet, Take 1 tablet (0.6 mg total) by mouth daily., Disp: 30 tablet, Rfl: 0   Continuous Glucose Sensor (FREESTYLE LIBRE 3 SENSOR) MISC, Use as directed for blood sugar monitoring at least three times daily and as needed, Disp: 6 each, Rfl: 3   empagliflozin (JARDIANCE) 25 MG TABS tablet, Take 1 tablet (25 mg total) by mouth daily in the morning, Disp: 90 tablet, Rfl: 3   Evolocumab (REPATHA SURECLICK) 140 MG/ML SOAJ, Inject 140 mg into the  skin every 14 (fourteen) days. Please keep your upcoming appointment for refills., Disp: 2 mL, Rfl: 11   furosemide (LASIX) 20 MG tablet, Take 10 mg by mouth daily., Disp: , Rfl:    glucose blood (ACCU-CHEK GUIDE) test strip, Use 1 to test blood sugar levels everyday., Disp: 300 each, Rfl: 3   insulin glargine (LANTUS SOLOSTAR) 100 UNIT/ML Solostar Pen, Inject 16 Units into the skin 2 (two) times daily. (Patient taking differently: Inject 16 Units into the skin 2 (two) times daily.), Disp: 12 mL, Rfl: 11   insulin glargine (LANTUS) 100 UNIT/ML Solostar Pen, Inject 16 units in the morning and 20 Units into the skin at bedtime., Disp: 12 mL, Rfl: 3   losartan (COZAAR) 25 MG tablet, Take 1 tablet (25 mg total) by mouth every morning AND 1/2 tablet (12.5 mg total) at bedtime., Disp: 135 tablet, Rfl: 3    metoprolol tartrate (LOPRESSOR) 50 MG tablet, Take 1 tablet (50 mg total) by mouth 2 (two) times daily., Disp: 180 tablet, Rfl: 3   omeprazole (PRILOSEC) 40 MG capsule, Take 40 mg by mouth as needed., Disp: , Rfl:    OVER THE COUNTER MEDICATION, Take 2 Scoops by mouth daily. [place], Disp: , Rfl:    Pitavastatin Calcium 1 MG TABS, Take 1 tablet (1 mg total) by mouth at bedtime., Disp: 90 tablet, Rfl: 3   tirzepatide (MOUNJARO) 2.5 MG/0.5ML Pen, Inject 2.5 mg into the skin once a week., Disp: 2 mL, Rfl: 3   Vitamin D3 (VITAMIN D) 25 MCG tablet, Take 1 tablet (1,000 Units total) by mouth daily., Disp: 30 tablet, Rfl: 0 No current facility-administered medications for this visit.  Facility-Administered Medications Ordered in Other Visits:    sodium phosphate (FLEET) 7-19 GM/118ML enema 1 enema, 1 enema, Rectal, Once, Crist Fat, MD  Social History   Tobacco Use  Smoking Status Never  Smokeless Tobacco Never    Allergies  Allergen Reactions   Erythromycin Nausea And Vomiting   Objective:   Vitals:   08/01/23 1302  BP: (!) 174/90   There is no height or weight on file to calculate BMI. Constitutional Well developed. Well nourished.  Vascular Dorsalis pedis pulses palpable bilaterally. Posterior tibial pulses palpable bilaterally. Capillary refill normal to all digits.  No cyanosis or clubbing noted. Pedal hair growth normal.  Neurologic Normal speech. Oriented to person, place, and time. Decreased appetite sensation to light touch grossly present bilaterally.  Decreased protective sensation noted.  Dermatologic Nails within normal limits. Skin within normal limits  Orthopedic: Normal joint ROM without pain or crepitus bilaterally. No visible deformities. No bony tenderness.   Radiographs: None Assessment:   1. Neuropathic pain   2. Diabetes mellitus due to underlying condition with hyperosmolarity without coma, with long-term current use of insulin (HCC)     Plan:  Patient was evaluated and treated and all questions answered.  Bilateral neuropathy digits and forefoot -All questions and concerns were discussed with the patient in extensiveYeah detail given the mild of neuropathy that is present likely due to diabetes I believe patient will benefit from Lyrica.  She will discontinue gabapentin I will place her on Lyrica.  Lyrica was sent to the pharmacy if there is improvement and she can discuss this further with her PCP physician  Return in about 3 months (around 11/01/2023) for RFC.

## 2023-08-18 ENCOUNTER — Other Ambulatory Visit (HOSPITAL_COMMUNITY): Payer: Self-pay

## 2023-08-18 MED ORDER — MOUNJARO 2.5 MG/0.5ML ~~LOC~~ SOAJ
2.5000 mg | SUBCUTANEOUS | 3 refills | Status: DC
Start: 2023-08-18 — End: 2023-12-15
  Filled 2023-08-18: qty 2, 28d supply, fill #0
  Filled 2023-09-17: qty 2, 28d supply, fill #1
  Filled 2023-10-15: qty 2, 28d supply, fill #2
  Filled 2023-11-17: qty 2, 28d supply, fill #3

## 2023-08-18 MED ORDER — LANTUS SOLOSTAR 100 UNIT/ML ~~LOC~~ SOPN
PEN_INJECTOR | SUBCUTANEOUS | 3 refills | Status: DC
Start: 2023-08-18 — End: 2023-12-15
  Filled 2023-08-18: qty 12, 33d supply, fill #0
  Filled 2023-09-17: qty 12, 33d supply, fill #1
  Filled 2023-10-15: qty 12, 33d supply, fill #2
  Filled 2023-11-12: qty 12, 33d supply, fill #3

## 2023-08-19 ENCOUNTER — Other Ambulatory Visit (HOSPITAL_COMMUNITY): Payer: Self-pay

## 2023-08-20 ENCOUNTER — Other Ambulatory Visit (HOSPITAL_COMMUNITY): Payer: Self-pay

## 2023-08-20 MED ORDER — FUROSEMIDE 20 MG PO TABS
20.0000 mg | ORAL_TABLET | Freq: Every day | ORAL | 2 refills | Status: DC
  Filled 2023-08-20: qty 90, 90d supply, fill #0
  Filled 2023-12-16: qty 90, 90d supply, fill #1

## 2023-08-27 ENCOUNTER — Other Ambulatory Visit (HOSPITAL_COMMUNITY): Payer: Self-pay

## 2023-08-27 MED ORDER — TAMSULOSIN HCL 0.4 MG PO CAPS
0.4000 mg | ORAL_CAPSULE | Freq: Every day | ORAL | 11 refills | Status: DC
  Filled 2023-08-27: qty 30, 30d supply, fill #0
  Filled 2023-12-16: qty 30, 30d supply, fill #1

## 2023-09-04 ENCOUNTER — Other Ambulatory Visit (HOSPITAL_COMMUNITY): Payer: Self-pay

## 2023-09-04 MED ORDER — PITAVASTATIN CALCIUM 1 MG PO TABS
1.0000 | ORAL_TABLET | Freq: Every day | ORAL | 3 refills | Status: DC
  Filled 2023-09-04: qty 90, 90d supply, fill #0

## 2023-09-10 ENCOUNTER — Other Ambulatory Visit: Payer: Self-pay | Admitting: Family Medicine

## 2023-09-10 DIAGNOSIS — R222 Localized swelling, mass and lump, trunk: Secondary | ICD-10-CM

## 2023-09-12 ENCOUNTER — Encounter: Payer: Self-pay | Admitting: Family Medicine

## 2023-09-15 ENCOUNTER — Other Ambulatory Visit

## 2023-09-17 ENCOUNTER — Other Ambulatory Visit (HOSPITAL_COMMUNITY): Payer: Self-pay

## 2023-09-18 ENCOUNTER — Other Ambulatory Visit: Payer: Self-pay

## 2023-09-19 ENCOUNTER — Ambulatory Visit
Admission: RE | Admit: 2023-09-19 | Discharge: 2023-09-19 | Disposition: A | Discharge status: Home / Self Care | Source: Ambulatory Visit | Attending: Family Medicine | Admitting: Family Medicine

## 2023-09-19 DIAGNOSIS — R222 Localized swelling, mass and lump, trunk: Secondary | ICD-10-CM

## 2023-09-24 ENCOUNTER — Other Ambulatory Visit (HOSPITAL_COMMUNITY): Payer: Self-pay

## 2023-09-24 MED ORDER — CHLORTHALIDONE 25 MG PO TABS
25.0000 mg | ORAL_TABLET | Freq: Every day | ORAL | 0 refills | Status: DC
  Filled 2023-09-24: qty 90, 90d supply, fill #0

## 2023-10-01 ENCOUNTER — Encounter: Payer: Self-pay | Admitting: Podiatry

## 2023-10-01 ENCOUNTER — Ambulatory Visit (INDEPENDENT_AMBULATORY_CARE_PROVIDER_SITE_OTHER): Admitting: Podiatry

## 2023-10-01 DIAGNOSIS — M79674 Pain in right toe(s): Secondary | ICD-10-CM | POA: Diagnosis not present

## 2023-10-01 DIAGNOSIS — B351 Tinea unguium: Secondary | ICD-10-CM

## 2023-10-01 DIAGNOSIS — R600 Localized edema: Secondary | ICD-10-CM

## 2023-10-01 DIAGNOSIS — M79675 Pain in left toe(s): Secondary | ICD-10-CM

## 2023-10-01 DIAGNOSIS — E0800 Diabetes mellitus due to underlying condition with hyperosmolarity without nonketotic hyperglycemic-hyperosmolar coma (NKHHC): Secondary | ICD-10-CM

## 2023-10-01 DIAGNOSIS — G629 Polyneuropathy, unspecified: Secondary | ICD-10-CM

## 2023-10-01 DIAGNOSIS — E119 Type 2 diabetes mellitus without complications: Secondary | ICD-10-CM

## 2023-10-01 NOTE — Patient Instructions (Signed)
 Edema  Edema is an abnormal buildup of fluids in the body tissues and under the skin. Swelling of the legs, feet, and ankles is a common symptom that becomes more likely as you get older. Swelling is also common in looser tissues, such as around the eyes. Pressing on the area may make a temporary dent in your skin (pitting edema). This fluid may also accumulate in your lungs (pulmonary edema). There are many possible causes of edema. Eating too much salt (sodium) and being on your feet or sitting for a long time can cause edema in your legs, feet, and ankles. Common causes of edema include: Certain medical conditions, such as heart failure, liver or kidney disease, and cancer. Weak leg blood vessels. An injury. Pregnancy. Medicines. Being obese. Low protein levels in the blood. Hot weather may make edema worse. Edema is usually painless. Your skin may look swollen or shiny. Follow these instructions at home: Medicines Take over-the-counter and prescription medicines only as told by your health care provider. Your health care provider may prescribe a medicine to help your body get rid of extra water (diuretic). Take this medicine if you are told to take it. Eating and drinking Eat a low-salt (low-sodium) diet to reduce fluid as told by your health care provider. Sometimes, eating less salt may reduce swelling. Depending on the cause of your swelling, you may need to limit how much fluid you drink (fluid restriction). General instructions Raise (elevate) the injured area above the level of your heart while you are sitting or lying down. Do not sit still or stand for long periods of time. Do not wear tight clothing. Do not wear garters on your upper legs. Exercise your legs to get your circulation going. This helps to move the fluid back into your blood vessels, and it may help the swelling go down. Wear compression stockings as told by your health care provider. These stockings help to prevent  blood clots and reduce swelling in your legs. It is important that these are the correct size. These stockings should be prescribed by your health care provider to prevent possible injuries. If elastic bandages or wraps are recommended, use them as told by your health care provider. Contact a health care provider if: Your edema does not get better with treatment. You have heart, liver, or kidney disease and have symptoms of edema. You have sudden and unexplained weight gain. Get help right away if: You develop shortness of breath or chest pain. You cannot breathe when you lie down. You develop pain, redness, or warmth in the swollen areas. You have heart, liver, or kidney disease and suddenly get edema. You have a fever and your symptoms suddenly get worse. These symptoms may be an emergency. Get help right away. Call 911. Do not wait to see if the symptoms will go away. Do not drive yourself to the hospital. Summary Edema is an abnormal buildup of fluids in the body tissues and under the skin. Eating too much salt (sodium)and being on your feet or sitting for a long time can cause edema in your legs, feet, and ankles. Raise (elevate) the injured area above the level of your heart while you are sitting or lying down. Follow your health care provider's instructions about diet and how much fluid you can drink. This information is not intended to replace advice given to you by your health care provider. Make sure you discuss any questions you have with your health care provider. Document Revised: 01/08/2021 Document  Reviewed: 01/08/2021 Elsevier Patient Education  2024 [employer]  Nerve Damage From Diabetes (Diabetic Neuropathy): What to Know Diabetic neuropathy is when diabetes causes damage to your nerves. Over time, people with diabetes can get nerve damage throughout the body.  There are different types of diabetic neuropathy: Peripheral neuropathy. This is the most common type. It  damages the peripheral nerves. These are nerves that send signals between the spinal cord and other parts of the body. This type usually affects the feet, legs, hands, and arms. Autonomic neuropathy. This type causes damage to autonomic nerves. These nerves control things that your body does automatically and that you don't control. This includes the heartbeat, body temperature, blood pressure, peeing, digestion, sweating, and sexual function. It can also affect your body's response to changes in blood sugar (glucose). Focal neuropathy. This type affects one area of the body, like an arm, a leg, or the face. It may involve one nerve or a small group of nerves. It can be painful and hard to predict. It occurs most often in older adults with diabetes. This type may come on suddenly. It usually gets better over time and doesn't cause long-term problems. Proximal neuropathy. This type affects the nerves of the thighs, hips, butt, or legs. It causes very bad pain, weakness, and muscle death, usually in the thigh muscles. It's more common among older men and people who have type 2 diabetes. The time it takes to recover may vary. What are the causes? Peripheral, autonomic, and focal neuropathies are caused by diabetes that's not well controlled with treatment.  The cause of proximal neuropathy isn't known. It may be linked to irritation and swelling caused by not controlling blood sugar levels. What are the signs or symptoms? Peripheral neuropathy The nerve damage happens slowly over time. When the nerves of the feet and legs no longer work, you may have: Burning, stabbing, or aching pain in the legs or feet. Cramping in the legs or feet. Loss of feeling (numbness) and not being able to feel pressure or pain in the feet. This can lead to: Thick calluses or sores, also called ulcers, on areas of constant pressure. Not being able to feel temperature changes. Changes in the shape of the feet. Muscle  weakness. Loss of balance or coordination. Autonomic neuropathy Symptoms vary depending on which nerves are affected. Symptoms may include: Problems with digestion, like: Throwing up or feeling like you may throw up. Poor appetite. Bloating. Watery poop (diarrhea) or trouble pooping (constipation). Trouble swallowing. Losing weight without trying to. Problems with the heart, blood, and lungs, such as: Feeling dizzy, especially when standing up. Fainting. Shortness of breath. Irregular heartbeat. Bladder problems, such as: Trouble starting or stopping when you pee. Leaking pee. Trouble emptying the bladder. Urinary tract infections (UTIs). Problems with other body functions, such as: Sweat. You may sweat too much or too little. Temperature. You might get hot easily. Or, you might feel cold more than usual. Sexual function. Males may not be able to get or keep an erection. Females may have vaginal dryness and trouble with arousal. Focal neuropathy Symptoms affect only one area of the body. Common symptoms include: Numbness or tingling. Burning pain. A prickling feeling. Very sensitive skin. Weakness. Not being able to move (paralysis). Muscle twitching. Muscles getting smaller, or wasting. Poor coordination. Double or blurred vision. Proximal neuropathy Sudden, very bad pain in the hip, thigh, or butt. Pain may spread from the back into the legs. This is called sciatica. Pain and  numbness in the arms and legs. Tingling. Losing control of peeing and pooping. Weakness and wasting of thigh muscles. Trouble getting up from a seated position. Swelling of the belly. Losing weight without trying to. How is this diagnosed? Diagnosis varies depending on the type of neuropathy your health care provider thinks you have. It may include: A neurologic exam. This exam checks: Your reflexes. How you move. What you can feel. Blood tests. Lumbar puncture. This tests the fluid that  surrounds the spinal cord. Imaging tests, such as: A CT scan. An MRI. Tests on the nerves, such as: Electromyogram (EMG). This checks the nerves that control muscles. Nerve conduction study. This test checks how quickly signals pass through your nerves. Biopsy. This is when a small piece of a nerve is removed for testing. For autonomic neuropathy, you may have tests to check things like: Your blood pressure. Your heart rate. Your breathing. Your digestion. Your bladder. There's no specific test for proximal neuropathy. But you may have tests to rule out other causes of your symptoms. How is this treated? The goal of treatment is to keep nerve damage from getting worse. Treatment may include: Following your diabetes management plan. This will help keep your blood sugar level and your A1C level within your target range. This is the most important treatment. Taking pain medicine. Follow these instructions at home: Diabetes management Follow your diabetes management plan as told by your provider. Check your blood sugar levels. Keep your blood sugar in your target range. Have your A1C level checked at least 2 times a year, or as often as told. Take your medicines only as told. This includes insulin  and diabetes medicine. Lifestyle  Do not smoke, vape, or use nicotine or tobacco. Be physically active every day. Include strength training and balance exercises. Follow a healthy meal plan. Work with your provider to manage your blood pressure. General instructions Ask your provider if it's safe to drive or use machines while taking your medicine. Check your skin and feet every day for: Cuts. Bruises. Redness. Blisters. Sores. Contact a health care provider if: You have any new symptoms. Your symptoms get worse. Your symptoms don't get better with treatment. Get help right away if: You have sudden weakness or loss of coordination. You have trouble speaking. You have pain or  pressure in your chest. You're not able to move a part of your body all of a sudden. These symptoms may be an emergency. Call 911 right away. Do not wait to see if the symptoms will go away. Do not drive yourself to the hospital. This information is not intended to replace advice given to you by your health care provider. Make sure you discuss any questions you have with your health care provider. Document Revised: 01/08/2023 Document Reviewed: 01/08/2023 Elsevier Patient Education  2024 [employer].

## 2023-10-02 ENCOUNTER — Other Ambulatory Visit (HOSPITAL_COMMUNITY): Payer: Self-pay

## 2023-10-05 NOTE — Progress Notes (Signed)
  Subjective:  Patient ID: Thomas Gardner, male    DOB: 1956-05-03,  MRN: [id number]  68 y.o. male presents preventative diabetic foot care and painful, elongated thickened toenails x 10 which are symptomatic when wearing enclosed shoe gear. This interferes with his/her daily activities. Chief Complaint  Patient presents with   Nail Problem    RFC   New problem(s): None   PCP is Ulysees Gander, MD.  Allergies  Allergen Reactions   Erythromycin Nausea And Vomiting    Review of Systems: Negative except as noted in the HPI.   Objective:  Thomas Gardner is a pleasant 68 y.o. male obese in NAD. AAO x 3.  Vascular Examination: Vascular status intact b/l with palpable pedal pulses. CFT immediate b/l. Pedal hair present. Nonpitting edema b/l. No pain with calf compression b/l. Skin temperature gradient WNL b/l. No varicosities noted. No cyanosis or clubbing noted.  Neurological Examination: Pt has subjective symptoms of neuropathy. Sensation grossly intact b/l with 10 gram monofilament. Vibratory sensation intact b/l.  Dermatological Examination: Pedal skin with normal turgor, texture and tone b/l. No open wounds nor interdigital macerations noted. Toenails 1-5 b/l thick, discolored, elongated with subungual debris and pain on dorsal palpation. No hyperkeratotic lesions noted b/l.   Musculoskeletal Examination: Muscle strength 5/5 to b/l LE.  No pain, crepitus noted b/l. No gross pedal deformities. Patient ambulates independently without assistive aids.   Radiographs: None  Last A1c:       No data to display           Assessment:   1. Pain due to onychomycosis of toenails of both feet   2. 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 All patient's and/or POA's questions/concerns addressed on today's visit.  Continue foot and shoe inspections daily. Monitor blood glucose per PCP/Endocrinologist's recommendations. Continue soft, supportive shoe gear daily. Report any pedal injuries to medical professional. Call office if there are any questions/concerns. -Discussed causes/treatments of edema and neuropathy. He will discuss with his PCP at his next visit. -Patient/POA to call should there be question/concern in the interim.  Return in about 3 months (around 01/01/2024).  Luella Sager, DPM      [place] LOCATION: [address] [place] [address]                   Office [phone]   [facility] LOCATION: [address] [place], [place] [address] Office [phone]

## 2023-10-06 ENCOUNTER — Other Ambulatory Visit (HOSPITAL_COMMUNITY): Payer: Self-pay

## 2023-10-06 MED ORDER — EMPAGLIFLOZIN 25 MG PO TABS
25.0000 mg | ORAL_TABLET | Freq: Every day | ORAL | 0 refills | Status: DC
  Filled 2023-10-06: qty 90, 90d supply, fill #0

## 2023-10-15 ENCOUNTER — Other Ambulatory Visit (HOSPITAL_COMMUNITY): Payer: Self-pay

## 2023-10-15 MED ORDER — LOSARTAN POTASSIUM 25 MG PO TABS
25.0000 mg | ORAL_TABLET | ORAL | 2 refills | Status: DC
  Filled 2023-10-15: qty 135, 90d supply, fill #0
  Filled 2023-12-16: qty 135, 90d supply, fill #1

## 2023-10-16 ENCOUNTER — Other Ambulatory Visit: Payer: Self-pay

## 2023-10-16 ENCOUNTER — Other Ambulatory Visit (HOSPITAL_COMMUNITY): Payer: Self-pay

## 2023-10-16 MED ORDER — JARDIANCE 25 MG PO TABS
25.0000 mg | ORAL_TABLET | Freq: Every morning | ORAL | 3 refills | Status: DC
  Filled 2023-10-16 – 2024-01-01 (×3): qty 90, 90d supply, fill #0
  Filled 2024-04-12: qty 90, 90d supply, fill #1

## 2023-10-16 MED ORDER — MOUNJARO 5 MG/0.5ML ~~LOC~~ SOAJ
5.0000 mg | SUBCUTANEOUS | 3 refills | Status: DC
  Filled 2023-10-16 – 2023-11-17 (×2): qty 6, 84d supply, fill #0

## 2023-10-30 ENCOUNTER — Ambulatory Visit: Payer: Medicare PPO | Admitting: Podiatry

## 2023-11-12 ENCOUNTER — Other Ambulatory Visit (HOSPITAL_COMMUNITY): Payer: Self-pay

## 2023-11-17 ENCOUNTER — Other Ambulatory Visit (HOSPITAL_COMMUNITY): Payer: Self-pay

## 2023-11-18 ENCOUNTER — Other Ambulatory Visit (HOSPITAL_COMMUNITY): Payer: Self-pay

## 2023-11-19 ENCOUNTER — Other Ambulatory Visit (HOSPITAL_COMMUNITY): Payer: Self-pay

## 2023-11-19 MED ORDER — PITAVASTATIN CALCIUM 1 MG PO TABS
1.0000 | ORAL_TABLET | Freq: Every day | ORAL | 3 refills | Status: DC
  Filled 2023-11-19: qty 90, 90d supply, fill #0

## 2023-11-19 MED ORDER — LOSARTAN POTASSIUM 25 MG PO TABS
ORAL_TABLET | ORAL | 3 refills | Status: DC
  Filled 2023-11-19: qty 135, 68d supply, fill #0
  Filled 2023-12-16: qty 135, 90d supply, fill #0

## 2023-11-20 ENCOUNTER — Other Ambulatory Visit (HOSPITAL_COMMUNITY): Payer: Self-pay

## 2023-12-10 ENCOUNTER — Other Ambulatory Visit: Payer: Self-pay | Admitting: Podiatry

## 2023-12-10 ENCOUNTER — Other Ambulatory Visit (HOSPITAL_COMMUNITY): Payer: Self-pay

## 2023-12-15 ENCOUNTER — Other Ambulatory Visit (HOSPITAL_COMMUNITY): Payer: Self-pay

## 2023-12-15 ENCOUNTER — Other Ambulatory Visit: Payer: Self-pay | Admitting: Podiatry

## 2023-12-15 MED ORDER — TIRZEPATIDE 2.5 MG/0.5ML ~~LOC~~ SOAJ
2.5000 mg | SUBCUTANEOUS | 0 refills | Status: DC
  Filled 2023-12-15: qty 2, 28d supply, fill #0

## 2023-12-15 MED ORDER — INSULIN GLARGINE 100 UNIT/ML SOLOSTAR PEN
PEN_INJECTOR | SUBCUTANEOUS | 0 refills | Status: DC
  Filled 2023-12-15: qty 12, 33d supply, fill #0

## 2023-12-16 ENCOUNTER — Other Ambulatory Visit: Payer: Self-pay | Admitting: Podiatry

## 2023-12-16 ENCOUNTER — Other Ambulatory Visit (HOSPITAL_COMMUNITY): Payer: Self-pay

## 2023-12-16 ENCOUNTER — Other Ambulatory Visit: Payer: Self-pay

## 2023-12-17 ENCOUNTER — Other Ambulatory Visit: Payer: Self-pay

## 2023-12-17 ENCOUNTER — Other Ambulatory Visit (HOSPITAL_COMMUNITY): Payer: Self-pay

## 2023-12-17 MED ORDER — CHLORTHALIDONE 25 MG PO TABS
25.0000 mg | ORAL_TABLET | Freq: Every day | ORAL | 0 refills | Status: DC
  Filled 2023-12-17: qty 90, 90d supply, fill #0

## 2023-12-18 ENCOUNTER — Other Ambulatory Visit (HOSPITAL_COMMUNITY): Payer: Self-pay

## 2023-12-18 MED ORDER — MOUNJARO 2.5 MG/0.5ML ~~LOC~~ SOAJ
2.5000 mg | SUBCUTANEOUS | 3 refills | Status: AC
  Filled 2023-12-18 – 2024-01-08 (×3): qty 6, 84d supply, fill #0

## 2023-12-19 ENCOUNTER — Other Ambulatory Visit (HOSPITAL_COMMUNITY): Payer: Self-pay

## 2023-12-22 ENCOUNTER — Other Ambulatory Visit (HOSPITAL_COMMUNITY): Payer: Self-pay

## 2023-12-23 ENCOUNTER — Other Ambulatory Visit (HOSPITAL_COMMUNITY): Payer: Self-pay

## 2023-12-24 ENCOUNTER — Other Ambulatory Visit (HOSPITAL_COMMUNITY): Payer: Self-pay

## 2023-12-24 ENCOUNTER — Other Ambulatory Visit: Payer: Self-pay | Admitting: Podiatry

## 2023-12-24 NOTE — Telephone Encounter (Signed)
 Patient came into clinic and is trying to retreive a medication that was prescribed in March Dr patel, in need of refill asap

## 2023-12-25 ENCOUNTER — Other Ambulatory Visit (HOSPITAL_COMMUNITY): Payer: Self-pay

## 2023-12-26 ENCOUNTER — Other Ambulatory Visit (HOSPITAL_COMMUNITY): Payer: Self-pay

## 2023-12-26 MED ORDER — COLCHICINE 0.6 MG PO TABS
1.2000 mg | ORAL_TABLET | Freq: Once | ORAL | 0 refills | Status: DC
  Filled 2023-12-26: qty 2, 1d supply, fill #0

## 2023-12-30 ENCOUNTER — Telehealth (HOSPITAL_COMMUNITY): Payer: Self-pay

## 2023-12-30 NOTE — Telephone Encounter (Signed)
 Provided information to respondant that order for DVT study missing information. Requested update to be sent, or call from office for clarification.   No information has been received to clarify the missing information that needed update.  Due to lack of information exchange unable to proceed with scheduling.   For questions at my office please call [phone] Number provided on order for contact purposes: [phone]

## 2024-01-01 ENCOUNTER — Other Ambulatory Visit (HOSPITAL_COMMUNITY): Payer: Self-pay

## 2024-01-02 ENCOUNTER — Other Ambulatory Visit (HOSPITAL_COMMUNITY): Payer: Self-pay

## 2024-01-05 ENCOUNTER — Other Ambulatory Visit (HOSPITAL_COMMUNITY): Payer: Self-pay

## 2024-01-05 MED ORDER — ALLOPURINOL 100 MG PO TABS
100.0000 mg | ORAL_TABLET | Freq: Every day | ORAL | 0 refills | Status: AC
Start: 2024-01-05 — End: ?
  Filled 2024-01-05: qty 90, 90d supply, fill #0

## 2024-01-08 ENCOUNTER — Other Ambulatory Visit (HOSPITAL_COMMUNITY): Payer: Self-pay

## 2024-01-13 ENCOUNTER — Other Ambulatory Visit (HOSPITAL_COMMUNITY): Payer: Self-pay

## 2024-01-13 ENCOUNTER — Encounter (HOSPITAL_BASED_OUTPATIENT_CLINIC_OR_DEPARTMENT_OTHER): Payer: Self-pay | Admitting: Family

## 2024-01-13 ENCOUNTER — Ambulatory Visit (INDEPENDENT_AMBULATORY_CARE_PROVIDER_SITE_OTHER): Admitting: Podiatry

## 2024-01-13 ENCOUNTER — Encounter: Payer: Self-pay | Admitting: Podiatry

## 2024-01-13 ENCOUNTER — Ambulatory Visit (INDEPENDENT_AMBULATORY_CARE_PROVIDER_SITE_OTHER): Admitting: Family

## 2024-01-13 VITALS — BP 158/90 | HR 80 | Ht 66.0 in | Wt 231.0 lb

## 2024-01-13 DIAGNOSIS — M79674 Pain in right toe(s): Secondary | ICD-10-CM

## 2024-01-13 DIAGNOSIS — Z794 Long term (current) use of insulin: Secondary | ICD-10-CM

## 2024-01-13 DIAGNOSIS — M79675 Pain in left toe(s): Secondary | ICD-10-CM | POA: Diagnosis not present

## 2024-01-13 DIAGNOSIS — I5022 Chronic systolic (congestive) heart failure: Secondary | ICD-10-CM

## 2024-01-13 DIAGNOSIS — Z8673 Personal history of transient ischemic attack (TIA), and cerebral infarction without residual deficits: Secondary | ICD-10-CM

## 2024-01-13 DIAGNOSIS — E0800 Diabetes mellitus due to underlying condition with hyperosmolarity without nonketotic hyperglycemic-hyperosmolar coma (NKHHC): Secondary | ICD-10-CM | POA: Diagnosis not present

## 2024-01-13 DIAGNOSIS — E7849 Other hyperlipidemia: Secondary | ICD-10-CM

## 2024-01-13 DIAGNOSIS — B351 Tinea unguium: Secondary | ICD-10-CM | POA: Diagnosis not present

## 2024-01-13 DIAGNOSIS — I493 Ventricular premature depolarization: Secondary | ICD-10-CM | POA: Diagnosis not present

## 2024-01-13 DIAGNOSIS — Z8739 Personal history of other diseases of the musculoskeletal system and connective tissue: Secondary | ICD-10-CM

## 2024-01-13 DIAGNOSIS — I10 Essential (primary) hypertension: Secondary | ICD-10-CM

## 2024-01-13 MED ORDER — FUROSEMIDE 20 MG PO TABS
ORAL_TABLET | ORAL | 2 refills | Status: DC
  Filled 2024-01-13: qty 90, fill #0

## 2024-01-13 MED ORDER — OLMESARTAN MEDOXOMIL 20 MG PO TABS
20.0000 mg | ORAL_TABLET | Freq: Every day | ORAL | 3 refills | Status: DC
  Filled 2024-01-13: qty 90, 90d supply, fill #0

## 2024-01-13 MED ORDER — OLMESARTAN MEDOXOMIL 20 MG PO TABS
20.0000 mg | ORAL_TABLET | Freq: Every day | ORAL | 1 refills | Status: DC
  Filled 2024-01-13: qty 90, 90d supply, fill #0

## 2024-01-13 NOTE — Patient Instructions (Addendum)
 Medication Instructions:   CHANGE Lasix   to 20mg  with breakfast and 20mg  with lunch for 2 days. After 2 days, reduce to half tablet (10mg ) with breakfast and lunch.   STOP Losartan   START Olmesartan  20mg  every evening  *If you need a refill on your cardiac medications before your next appointment, please call your pharmacy*  Lab Work: Your physician recommends that you return for lab work today: CMET, uric acid, direct LDL, BNP  If you have labs (blood work) drawn today and your tests are completely normal, you will receive your results only by: MyChart Message (if you have MyChart) OR A paper copy in the mail If you have any lab test that is abnormal or we need to change your treatment, we will call you to review the results.  Testing/Procedures: Your EKG today looked good and showed only an occasional early beat.  Follow-Up: At [facility], you and your health needs are our priority.  As part of our continuing mission to provide you with exceptional heart care, our providers are all part of one team.  This team includes your primary Cardiologist (physician) and Advanced Practice Providers or APPs (Physician Assistants and Nurse Practitioners) who all work together to provide you with the care you need, when you need it.  Your next appointment:   2-3 month(s)  Provider:   Annabella Scarce, MD, Rosaline Bane, NP, or Reche Finder, NP    We recommend signing up for the patient portal called MyChart.  Sign up information is provided on this After Visit Summary.  MyChart is used to connect with patients for Virtual Visits (Telemedicine).  Patients are able to view lab/test results, encounter notes, upcoming appointments, etc.  Non-urgent messages can be sent to your provider as well.   To learn more about what you can do with MyChart, go to [email or web address].   Other Instructions  To prevent or reduce lower extremity swelling: Eat a low salt diet. Salt makes the  body hold onto extra fluid which causes swelling. Sit with legs elevated. For example, in the recliner or on an ottoman.  Wear knee-high compression stockings during the daytime. Ones labeled 15-20 mmHg provide good compression.

## 2024-01-13 NOTE — Progress Notes (Unsigned)
 Cardiology Office Note:  .   Date:  01/13/2024  ID:  Thomas Gardner, DOB 05/13/1956, MRN [id number] PCP: Thomas Leni Edyth DELENA, MD  [facility] Providers Cardiologist:  Annabella Scarce, MD    History of Present Illness: .   Thomas Gardner is a 68 y.o. 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   Myoview 2013 LVEF 37%, no ischemia.  Echo 11/2020 LVEF 55-40%, grade 1 diastolic dysfunction.  Admitted 02/2021 for acute ischemic stroke of left thalamic pyramid.  He severe stenosis proximal left external carotid artery and multifocal moderate to severe stenosis of the right posterior cerebral artery.  Treated with aspirin  Plavix  for 3 months then aspirin  alone.  Echo on admission LVEF 50%.  Required diuretics during hospitalization for diuresis.  Losartan  previously increased for BP control.  He also has displayed multiple of his medications to reduce lightheadedness, dizziness.  At last visit 08/2021 he was recommended to start rosuvastatin  3 times per week and continue Repatha .  Seen 12/10/22 and 7 day clinic pursued to assess PVC burden. Monitor with 9.6% PVC burden and was recommended to stop Carvedilol , start Metoprolol  tartrate - he was unable to be reached with these recommendations despite leaving 3 voicemails. Echo due to LE edema 01/01/23 stable LVEF 50% with no significant valvular abnormalities. Itamar home sleep study ordered as did not tolerate CPAP but interested in alternate therapies, but he did not complete.  At last visit 04/2023 was given PIN for Itamar but did not complete. For better control of symptomatic PVC, Carvedilol  was transitioned for Metoprolol .   Presents today for follow up. Notes bilateral lower leg swelling x 3 weeks despite  his home Lasix  dosing. Taking half tablet of Furosemide  10mg  BID. Swelling is persistent throughout the day. Does still make good urine on Furosemide . . He was treated for episode of gout with colchicine . Taking half tablet of Losartan  TID to prevent lightheadedness. He has not checked BP as home as cuff is broken. Stable mild DOE. No orthopnea, PND. Reports no palpitations.   ROS: Please see the history of present illness.    All other systems reviewed and are negative.   Studies Reviewed: SABRA   EKG Interpretation Date/Time:  Tuesday January 13 2024 10:35:37 EDT Ventricular Rate:  80 PR Interval:  166 QRS Duration:  112 QT Interval:  392 QTC Calculation: 452 R Axis:   -47  Text Interpretation: Sinus rhythm with occasional Premature ventricular complexes Left axis deviation Incomplete right bundle branch block Confirmed by Vannie Mora ([id number]) on 01/13/2024 10:37:55 AM    Cardiac Studies & Procedures   ______________________________________________________________________________________________   STRESS TESTS  MYOCARDIAL PERFUSION IMAGING 08/15/2011   ECHOCARDIOGRAM  ECHOCARDIOGRAM COMPLETE 01/01/2023  Narrative ECHOCARDIOGRAM REPORT    Patient Name:   Thomas Gardner Date of Exam: 01/01/2023 Medical Rec #:  [id number]       Height:       66.0 in Accession #:    [id number]      Weight:       225.1 lb Date of Birth:  1955/08/20       BSA:          2.103 m Patient Age:    67 years        BP:  170/88 mmHg Patient Gender: M               HR:           71 bpm. Exam Location:  [address]  Procedure: 2D Echo, 3D Echo, Cardiac Doppler, Color Doppler and Strain Analysis  Indications:    I50.9 CHF  History:        Patient has prior history of Echocardiogram examinations, most recent 03/04/2021. Risk Factors:Hypertension, HLD and Diabetes.  Sonographer:    Waldo Guadalajara RCS Referring Phys: [id number] Jazyiah Yiu S Abdulkareem Badolato  IMPRESSIONS   1. Left ventricular ejection  fraction, by estimation, is 50%. The left ventricle has normal function. The left ventricle has no regional wall motion abnormalities. Left ventricular diastolic parameters are consistent with Grade I diastolic dysfunction (impaired relaxation). 2. Right ventricular systolic function is normal. The right ventricular size is normal. 3. Left atrial size was mildly dilated. 4. The mitral valve is normal in structure. Trivial mitral valve regurgitation. No evidence of mitral stenosis. 5. The aortic valve is tricuspid. There is mild calcification of the aortic valve. Aortic valve regurgitation is not visualized. No aortic stenosis is present. 6. The inferior vena cava is normal in size with greater than 50% respiratory variability, suggesting right atrial pressure of 3 mmHg.  FINDINGS Left Ventricle: Left ventricular ejection fraction, by estimation, is 50%. The left ventricle has normal function. The left ventricle has no regional wall motion abnormalities. The left ventricular internal cavity size was normal in size. There is no left ventricular hypertrophy. Left ventricular diastolic parameters are consistent with Grade I diastolic dysfunction (impaired relaxation).  Right Ventricle: The right ventricular size is normal. No increase in right ventricular wall thickness. Right ventricular systolic function is normal.  Left Atrium: Left atrial size was mildly dilated.  Right Atrium: Right atrial size was normal in size.  Pericardium: There is no evidence of pericardial effusion.  Mitral Valve: The mitral valve is normal in structure. Trivial mitral valve regurgitation. No evidence of mitral valve stenosis.  Tricuspid Valve: The tricuspid valve is normal in structure. Tricuspid valve regurgitation is trivial. No evidence of tricuspid stenosis.  Aortic Valve: The aortic valve is tricuspid. There is mild calcification of the aortic valve. Aortic valve regurgitation is not visualized. No aortic  stenosis is present.  Pulmonic Valve: The pulmonic valve was normal in structure. Pulmonic valve regurgitation is trivial. No evidence of pulmonic stenosis.  Aorta: The aortic root is normal in size and structure.  Venous: The inferior vena cava is normal in size with greater than 50% respiratory variability, suggesting right atrial pressure of 3 mmHg.  IAS/Shunts: No atrial level shunt detected by color flow Doppler.   LEFT VENTRICLE PLAX 2D LVIDd:         5.10 cm Diastology LVIDs:         3.80 cm LV e' medial:    4.35 cm/s LV PW:         0.90 cm LV E/e' medial:  16.7 LV IVS:        0.70 cm LV e' lateral:   5.33 cm/s LV E/e' lateral: 13.7  2D Longitudinal Strain 2D Strain GLS (A2C):   -18.1 % 2D Strain GLS (A3C):   -15.5 % 2D Strain GLS (A4C):   -16.8 % 2D Strain GLS Avg:     -16.8 %  3D Volume EF: 3D EF:        50 % LV EDV:       165 ml LV  ESV:       83 ml LV SV:        82 ml  RIGHT VENTRICLE RV Basal diam:  3.20 cm RV S prime:     14.30 cm/s TAPSE (M-mode): 2.2 cm  LEFT ATRIUM             Index        RIGHT ATRIUM           Index LA diam:        4.60 cm 2.19 cm/m   RA Area:     11.00 cm LA Vol (A2C):   58.9 ml 28.01 ml/m  RA Volume:   25.20 ml  11.98 ml/m LA Vol (A4C):   51.6 ml 24.54 ml/m LA Biplane Vol: 56.1 ml 26.68 ml/m AORTIC VALVE LVOT Vmax:   85.40 cm/s LVOT Vmean:  60.700 cm/s LVOT VTI:    0.182 m  AORTA Ao Root diam: 3.70 cm Ao Asc diam:  3.40 cm  MITRAL VALVE MV Area (PHT):              SHUNTS MV Decel Time:              Systemic VTI: 0.18 m MV E velocity: 72.80 cm/s MV A velocity: 108.00 cm/s MV E/A ratio:  0.67  Toribio Fuel MD Electronically signed by Toribio Fuel MD Signature Date/Time: 01/01/2023/4:05:51 PM    Final    MONITORS  LONG TERM MONITOR (3-14 DAYS) 12/30/2022  Narrative 7 Day Zio Monitor  Quality: Fair.  Baseline artifact. Predominant rhythm: sinus rhythm Average heart rate: 85 bpm Max heart rate: 128  bpm Min heart rate: 59 bpm Pauses >2.5 seconds: none  Rare PACs (<1%) Frequent PVCs (9.6%) Rare ventricular couplets and triplets (<1%) Up to 13 beats NSVT Up to 6 beats SVT   Tiffany C. Raford, MD, [facility] 02/09/2023 8:40 AM       ______________________________________________________________________________________________        Risk Assessment/Calculations:     HYPERTENSION CONTROL Vitals:   01/13/24 1019 01/13/24 1047  BP: (!) 160/80 (!) 158/90    The patient's blood pressure is elevated above target today. {Click here if intervention needs to be changed Refresh Note :1}  In order to address the patient's elevated BP: A new medication was prescribed today.; Follow up with general cardiology has been recommended.      STOP-Bang Score:     { Consider Dx Sleep Disordered Breathing or Sleep Apnea  ICD G47.33          :1}    Physical Exam:   VS:  BP (!) 158/90   Pulse 80   Ht 5' 6 (1.676 m)   Wt 231 lb (104.8 kg)   BMI 37.28 kg/m    Wt Readings from Last 3 Encounters:  01/13/24 231 lb (104.8 kg)  07/02/23 228 lb 3.2 oz (103.5 kg)  04/25/23 228 lb 3.2 oz (103.5 kg)    GEN: Well nourished, overweight, well developed in no acute distress NECK: No JVD; No carotid bruits CARDIAC: RRR, no murmurs, rubs, gallops RESPIRATORY:  Clear to auscultation without rales, wheezing or rhonchi  ABDOMEN: Soft, non-tender, non-distended EXTREMITIES:  Trace bilateral pretibial edema.; No deformity   ASSESSMENT AND PLAN: .    PVC - Monitor 12/2021 with PVC burden 9.6%. Symptomatic with PVC.  Stop Carvedilol , start Metoprolol  tartrate 50mg  BID. Will request recent labs from PCP.  Hx of CVA - Continue aspirin , rosuvastatin , repatha . Will request recent labs from PCP.  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 GDMT Metoprolol , lasix , jardiance . NYHA I. Low sodium diet, fluid restriction <2L, and daily weights encouraged. Educated to contact our office for weight gain of 2 lbs overnight or 5  lbs in one week.   Familial hyperlipidemia - Continue Repatha , Rosuvastatin . Will request lipid panel from PCP.  OSA - Snores, stops breathing, daytime somnolence. Previously did not tolerate CPAP many years ago.  Interested in trying newer CPAP vs inspire device.  Thinks he completed Itamar, results not available. Will ask sleep team to review. ***       Dispo: follow up in 6 months with Dr. Raford or Reche GORMAN Finder, NP   Signed, Reche GORMAN Finder, NP

## 2024-01-14 ENCOUNTER — Ambulatory Visit: Payer: Self-pay | Admitting: Podiatry

## 2024-01-14 ENCOUNTER — Encounter: Payer: Self-pay | Admitting: Podiatry

## 2024-01-14 ENCOUNTER — Ambulatory Visit (INDEPENDENT_AMBULATORY_CARE_PROVIDER_SITE_OTHER): Admitting: Podiatry

## 2024-01-14 ENCOUNTER — Other Ambulatory Visit (HOSPITAL_COMMUNITY): Payer: Self-pay

## 2024-01-14 DIAGNOSIS — M10072 Idiopathic gout, left ankle and foot: Secondary | ICD-10-CM

## 2024-01-14 MED ORDER — LANTUS SOLOSTAR 100 UNIT/ML ~~LOC~~ SOPN
PEN_INJECTOR | SUBCUTANEOUS | 2 refills | Status: DC
  Filled 2024-01-14: qty 12, 33d supply, fill #0
  Filled 2024-02-19: qty 12, 33d supply, fill #1
  Filled 2024-03-22: qty 12, 33d supply, fill #2

## 2024-01-14 NOTE — Progress Notes (Signed)
 Subjective:  Patient ID: Thomas Gardner, male    DOB: 06-16-1955,  MRN: [id number]  Chief Complaint  Patient presents with   Foot Pain    Pt stated that he has been having some swelling in his feet and legs no recent injuries     68 y.o. male presents with the above complaint.  Patient presents with follow-up of bilateral first metatarsal phalangeal joint pain.  He states that it just came out of nowhere.  He had some red meat that may have led to gout.  But he is doing much better now he is on allopurinol  and colchicine .  Denies any other acute complaints.   Review of Systems: Negative except as noted in the HPI. Denies N/V/F/Ch.  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   nueruologist---- dr rosemarie;    04-10-2022  per pt residul at times balance impaired   Arthritis    back shoulders and legs   Asthma    Bilateral lower extremity edema    @ times, goes away with legs elevated   CHF (congestive heart failure) (HCC)    Complication of anesthesia    woke up with endoscopy yrs ago   Diabetes mellitus type 2, insulin  dependent (HCC) 2003   Does mobilize using cane    Familial hyperlipidemia 05/07/2021   GERD (gastroesophageal reflux disease)    History of hiatal hernia    Hyperlipidemia    Hypertension    OSA (obstructive sleep apnea)    did not tolerate cpap, uses pillow for elevation   Prostate cancer (HCC)     Current Outpatient Medications:    Accu-Chek Softclix Lancets lancets, Use to test blood sugar levels daily., Disp: 100 each, Rfl: 3   acetaminophen  (TYLENOL ) 325 MG tablet, Take 1-2 tablets (325-650 mg total) by mouth every 4 (four) hours as needed for mild pain., Disp: , Rfl:    albuterol  (VENTOLIN  HFA) 108 (90 Base) MCG/ACT inhaler, Inhale 1-2 puffs into the lungs every 6 (six) hours as needed., Disp: 9 g, Rfl: 3   allopurinol  (ZYLOPRIM ) 100 MG tablet, Take 1 tablet (100 mg total) by mouth daily., Disp: 90 tablet,  Rfl: 0   ascorbic acid  (VITAMIN C ) 500 MG tablet, Take 1 tablet (500 mg total) by mouth daily., Disp: 30 tablet, Rfl: 0   aspirin  EC 81 MG tablet, Take 1 tablet (81 mg total) by mouth daily. Swallow whole., Disp: 90 tablet, Rfl: 3   Blood Glucose Monitoring Suppl (ACCU-CHEK GUIDE) w/Device KIT, use as directed, Disp: 1 kit, Rfl: 0   Continuous Glucose Sensor (FREESTYLE LIBRE 3 SENSOR) MISC, Use as directed for blood sugar monitoring at least three times daily and as needed, Disp: 6 each, Rfl: 3   empagliflozin  (JARDIANCE ) 25 MG TABS tablet, Take 1 tablet (25 mg total) by mouth in the morning., Disp: 90 tablet, Rfl: 3   Evolocumab  (REPATHA  SURECLICK) 140 MG/ML SOAJ, Inject 140 mg into the skin every 14 (fourteen) days. Please keep your upcoming appointment for refills., Disp: 2 mL, Rfl: 11   furosemide  (LASIX ) 20 MG tablet, Take 1 tablet (20 mg total) by mouth 2 (two) times daily for 2 days, THEN 1/2 tablet (10 mg total) 2 (two) times daily with breakfast and lunch., Disp: 90 tablet, Rfl: 2   glucose blood (ACCU-CHEK GUIDE) test strip, Use 1 to test blood sugar levels everyday., Disp: 300 each, Rfl: 3   insulin  glargine (LANTUS  SOLOSTAR) 100 UNIT/ML Solostar  Pen, Inject 16 Units into the skin 2 (two) times daily. (Patient taking differently: Inject 16 Units into the skin 2 (two) times daily.), Disp: 12 mL, Rfl: 11   insulin  glargine (LANTUS  SOLOSTAR) 100 UNIT/ML Solostar Pen, Inject 16 Units into the skin every morning AND 20 Units at bedtime., Disp: 12 mL, Rfl: 2   metoprolol  tartrate (LOPRESSOR ) 50 MG tablet, Take 1 tablet (50 mg total) by mouth 2 (two) times daily., Disp: 180 tablet, Rfl: 3   olmesartan  (BENICAR ) 20 MG tablet, Take 1 tablet (20 mg total) by mouth at bedtime., Disp: 90 tablet, Rfl: 3   omeprazole  (PRILOSEC) 40 MG capsule, Take 40 mg by mouth as needed., Disp: , Rfl:    OVER THE COUNTER MEDICATION, Take 2 Scoops by mouth daily. [place], Disp: , Rfl:    Pitavastatin  Calcium  1 MG TABS,  take 1 tablet by mouth daily at bedtime, Disp: 90 tablet, Rfl: 3   pregabalin  (LYRICA ) 25 MG capsule, Take 1 capsule (25 mg total) by mouth 2 (two) times daily., Disp: 60 capsule, Rfl: 0   tamsulosin  (FLOMAX ) 0.4 MG CAPS capsule, Take 1 capsule (0.4 mg total) by mouth daily., Disp: 30 capsule, Rfl: 11   tirzepatide  (MOUNJARO ) 2.5 MG/0.5ML Pen, Inject 2.5 mg into the skin once a week., Disp: 6 mL, Rfl: 3   Vitamin D3 (VITAMIN D) 25 MCG tablet, Take 1 tablet (1,000 Units total) by mouth daily., Disp: 30 tablet, Rfl: 0 No current facility-administered medications for this visit.  Facility-Administered Medications Ordered in Other Visits:    sodium phosphate  (FLEET) 7-19 GM/118ML enema 1 enema, 1 enema, Rectal, Once, Cam Morene ORN, MD  Social History   Tobacco Use  Smoking Status Never  Smokeless Tobacco Never    Allergies  Allergen Reactions   Erythromycin Nausea And Vomiting   Objective:  There were no vitals filed for this visit. There is no height or weight on file to calculate BMI. Constitutional Well developed. Well nourished.  Vascular Dorsalis pedis pulses palpable bilaterally. Posterior tibial pulses palpable bilaterally. Capillary refill normal to all digits.  No cyanosis or clubbing noted. Pedal hair growth normal.  Neurologic Normal speech. Oriented to person, place, and time. Epicritic sensation to light touch grossly present bilaterally.  Dermatologic Nails well groomed and normal in appearance. No open wounds. No skin lesions.  Orthopedic: Pain on palpation bilaeral first metatarsophalangeal joint pain with range of motion of the joint no deep intra-articular pain noted.  Red hot swollen joint noted   Radiographs: 3 views of skeletally mature adult noted: No fractures noted.  No bony abnormalities noted.  Bipartite sesamoid of the medial sesamoid noted.  No other bony abnormalities identified Assessment:   1. Acute idiopathic gout involving toe of left foot      Plan:  Patient was evaluated and treated and all questions answered.  Bilateral first metatarsophalangeal joint capsulitis with underlying gout flare -All questions and concerns were discussed with the patient in extensive detail clinically patient had gout flare.  He is currently on colchicine  and allopurinol  is doing much better is no longer bother him some tenderness he just wanted to make sure he is doing okay Continue allopurinol - - Shoe gear modification discussed - Limit for now we will hold off on steroid injection as he does not have much tenderness today.  No follow-ups on file.

## 2024-01-15 ENCOUNTER — Ambulatory Visit (HOSPITAL_BASED_OUTPATIENT_CLINIC_OR_DEPARTMENT_OTHER): Payer: Self-pay | Admitting: Family

## 2024-01-15 DIAGNOSIS — I5022 Chronic systolic (congestive) heart failure: Secondary | ICD-10-CM

## 2024-01-15 LAB — COMPREHENSIVE METABOLIC PANEL WITH GFR
ALT: 15 IU/L (ref 0–44)
AST: 13 IU/L (ref 0–40)
Albumin: 4 g/dL (ref 3.9–4.9)
Alkaline Phosphatase: 88 IU/L (ref 44–121)
BUN/Creatinine Ratio: 13 (ref 10–24)
BUN: 20 mg/dL (ref 8–27)
Bilirubin Total: 0.3 mg/dL (ref 0.0–1.2)
CO2: 24 mmol/L (ref 20–29)
Calcium: 9.2 mg/dL (ref 8.6–10.2)
Chloride: 102 mmol/L (ref 96–106)
Creatinine, Ser: 1.54 mg/dL — ABNORMAL HIGH (ref 0.76–1.27)
Globulin, Total: 2.6 g/dL (ref 1.5–4.5)
Glucose: 207 mg/dL — ABNORMAL HIGH (ref 70–99)
Potassium: 4 mmol/L (ref 3.5–5.2)
Sodium: 142 mmol/L (ref 134–144)
Total Protein: 6.6 g/dL (ref 6.0–8.5)
eGFR: 49 mL/min/1.73 — ABNORMAL LOW (ref >59)

## 2024-01-15 LAB — LDL CHOLESTEROL, DIRECT: LDL Direct: 119 mg/dL — ABNORMAL HIGH (ref 0–99)

## 2024-01-15 LAB — URIC ACID: Uric Acid: 7.8 mg/dL (ref 3.8–8.4)

## 2024-01-15 LAB — BRAIN NATRIURETIC PEPTIDE: BNP: 170.3 pg/mL — ABNORMAL HIGH (ref 0.0–100.0)

## 2024-01-17 NOTE — Progress Notes (Signed)
  Subjective:  Patient ID: Thomas Gardner, male    DOB: 06-12-1955,  MRN: [id number]  Thomas Gardner presents to clinic today for at risk foot care. Patient has history of diabetes and h/o neuropathy and painful thick toenails that are difficult to trim. Pain interferes with ambulation. Aggravating factors include wearing enclosed shoe gear. Pain is relieved with periodic professional debridement. Patient states he is recovering from gout and is now on allopurinol . He has appointment with Dr. Tobie on tomorrow. Chief Complaint  Patient presents with   [facility]    Rm15 Diabetic foot care/ Dr. Leni Edyth Thomas last visit August 2025   New problem(s): None.   PCP is Thomas Leni Edyth DELENA, MD.  Allergies  Allergen Reactions   Erythromycin Nausea And Vomiting    Review of Systems: Negative except as noted in the HPI.  Objective: No changes noted in today's physical examination. There were no vitals filed for this visit. Thomas Gardner is a pleasant 68 y.o. male obese in NAD. AAO x 3.  Vascular Examination: Vascular status intact b/l with palpable pedal pulses. CFT immediate b/l. Pedal hair present. Nonpitting edema b/l. No pain with calf compression b/l. Skin temperature gradient WNL b/l. No varicosities noted. No cyanosis or clubbing noted.  Neurological Examination: Pt has subjective symptoms of neuropathy. Sensation grossly intact b/l with 10 gram monofilament. Vibratory sensation intact b/l.  Dermatological Examination: Pedal skin with normal turgor, texture and tone b/l. No open wounds nor interdigital macerations noted. Toenails 1-5 b/l thick, discolored, elongated with subungual debris and pain on dorsal palpation. No hyperkeratotic lesions noted b/l.   Musculoskeletal Examination: Muscle strength 5/5 to b/l LE.  No pain, crepitus noted b/l. No gross pedal deformities. Patient ambulates independently without assistive aids.   Radiographs: None  Assessment/Plan: 1. Pain due to  onychomycosis of toenails of both feet   2. Diabetes mellitus due to underlying condition with hyperosmolarity without coma, with long-term current use of insulin  [facility])   Patient was evaluated and treated. All patient's and/or POA's questions/concerns addressed on today's visit. Toenails 1-5 debrided in length and girth without incident. Continue foot and shoe inspections daily. Monitor blood glucose per PCP/Endocrinologist's recommendations. Continue soft, supportive shoe gear daily. Report any pedal injuries to medical professional. Call office if there are any questions/concerns. -Patient/POA to call should there be question/concern in the interim.   Return in about 3 months (around 04/14/2024).  Delon LITTIE Merlin, DPM      [facility] LOCATION: [address], [place] [address]                   Office [phone]   [facility] LOCATION: [address] [place], [place] [address] Office [phone]

## 2024-01-21 ENCOUNTER — Ambulatory Visit: Admitting: Podiatry

## 2024-01-28 ENCOUNTER — Encounter: Payer: Self-pay | Admitting: Podiatry

## 2024-02-01 LAB — BASIC METABOLIC PANEL WITH GFR
BUN/Creatinine Ratio: 13 (ref 10–24)
BUN: 22 mg/dL (ref 8–27)
CO2: 23 mmol/L (ref 20–29)
Calcium: 8.7 mg/dL (ref 8.6–10.2)
Chloride: 101 mmol/L (ref 96–106)
Creatinine, Ser: 1.66 mg/dL — ABNORMAL HIGH (ref 0.76–1.27)
Glucose: 166 mg/dL — ABNORMAL HIGH (ref 70–99)
Potassium: 3.8 mmol/L (ref 3.5–5.2)
Sodium: 138 mmol/L (ref 134–144)
eGFR: 45 mL/min/1.73 — ABNORMAL LOW (ref >59)

## 2024-02-01 LAB — BRAIN NATRIURETIC PEPTIDE: BNP: 119 pg/mL — ABNORMAL HIGH (ref 0.0–100.0)

## 2024-02-11 ENCOUNTER — Ambulatory Visit (INDEPENDENT_AMBULATORY_CARE_PROVIDER_SITE_OTHER)

## 2024-02-11 DIAGNOSIS — I5022 Chronic systolic (congestive) heart failure: Secondary | ICD-10-CM

## 2024-02-15 LAB — ECHOCARDIOGRAM COMPLETE
Area-P 1/2: 6.27 cm2
Calc EF: 59 %
S' Lateral: 4.65 cm
Single Plane A2C EF: 65.7 %
Single Plane A4C EF: 49.1 %

## 2024-02-16 ENCOUNTER — Other Ambulatory Visit (HOSPITAL_COMMUNITY): Payer: Self-pay

## 2024-02-16 MED ORDER — VITAMIN D3 25 MCG PO TABS
1000.0000 [IU] | ORAL_TABLET | Freq: Every day | ORAL | 0 refills | Status: DC
  Filled 2024-02-16: qty 100, 100d supply, fill #0

## 2024-02-16 MED ORDER — ASCORBIC ACID 500 MG PO TABS
500.0000 mg | ORAL_TABLET | Freq: Every day | ORAL | 0 refills | Status: DC
  Filled 2024-02-16: qty 100, 100d supply, fill #0

## 2024-02-16 MED ORDER — ACCU-CHEK SOFTCLIX LANCETS MISC
Freq: Every day | 3 refills | Status: AC
  Filled 2024-02-16: qty 100, 90d supply, fill #0

## 2024-02-16 MED ORDER — ASPIRIN 81 MG PO TBEC
81.0000 mg | DELAYED_RELEASE_TABLET | Freq: Every day | ORAL | 0 refills | Status: AC
  Filled 2024-02-16: qty 90, 90d supply, fill #0

## 2024-02-16 MED ORDER — ACCU-CHEK GUIDE TEST VI STRP
ORAL_STRIP | Freq: Every day | 3 refills | Status: AC
  Filled 2024-02-16: qty 50, 50d supply, fill #0

## 2024-02-16 MED ORDER — ACCU-CHEK GUIDE W/DEVICE KIT
PACK | 0 refills | Status: DC
  Filled 2024-02-16: qty 1, 90d supply, fill #0

## 2024-02-16 MED ORDER — FREESTYLE LIBRE 3 SENSOR MISC
3 refills | Status: AC
  Filled 2024-02-16: qty 6, 84d supply, fill #0

## 2024-02-18 NOTE — Telephone Encounter (Signed)
 Will forward pts mychart request for work extension to Reche Finder, NP to further review and advise on when she returns to the office.

## 2024-02-19 ENCOUNTER — Other Ambulatory Visit (HOSPITAL_COMMUNITY): Payer: Self-pay

## 2024-02-27 ENCOUNTER — Other Ambulatory Visit (HOSPITAL_COMMUNITY): Payer: Self-pay

## 2024-02-27 ENCOUNTER — Encounter (HOSPITAL_BASED_OUTPATIENT_CLINIC_OR_DEPARTMENT_OTHER): Payer: Self-pay | Admitting: *Deleted

## 2024-02-27 ENCOUNTER — Encounter (HOSPITAL_BASED_OUTPATIENT_CLINIC_OR_DEPARTMENT_OTHER): Payer: Self-pay | Admitting: Family

## 2024-02-27 ENCOUNTER — Ambulatory Visit (INDEPENDENT_AMBULATORY_CARE_PROVIDER_SITE_OTHER): Admitting: Family

## 2024-02-27 VITALS — BP 162/90 | HR 91 | Resp 17 | Ht 66.0 in | Wt 234.0 lb

## 2024-02-27 DIAGNOSIS — I493 Ventricular premature depolarization: Secondary | ICD-10-CM

## 2024-02-27 DIAGNOSIS — Z8673 Personal history of transient ischemic attack (TIA), and cerebral infarction without residual deficits: Secondary | ICD-10-CM

## 2024-02-27 DIAGNOSIS — G4733 Obstructive sleep apnea (adult) (pediatric): Secondary | ICD-10-CM

## 2024-02-27 DIAGNOSIS — I5022 Chronic systolic (congestive) heart failure: Secondary | ICD-10-CM | POA: Diagnosis not present

## 2024-02-27 DIAGNOSIS — R0683 Snoring: Secondary | ICD-10-CM

## 2024-02-27 DIAGNOSIS — I5020 Unspecified systolic (congestive) heart failure: Secondary | ICD-10-CM

## 2024-02-27 DIAGNOSIS — G4719 Other hypersomnia: Secondary | ICD-10-CM

## 2024-02-27 DIAGNOSIS — R0681 Apnea, not elsewhere classified: Secondary | ICD-10-CM

## 2024-02-27 MED ORDER — METOPROLOL SUCCINATE ER 50 MG PO TB24
50.0000 mg | ORAL_TABLET | Freq: Every day | ORAL | 2 refills | Status: DC
  Filled 2024-02-27: qty 90, 90d supply, fill #0
  Filled 2024-04-23: qty 90, 90d supply, fill #1

## 2024-02-27 MED ORDER — SACUBITRIL-VALSARTAN 49-51 MG PO TABS: 1.0000 | ORAL_TABLET | Freq: Two times a day (BID) | ORAL | 0 refills | Status: DC

## 2024-02-27 MED ORDER — METOPROLOL TARTRATE 100 MG PO TABS
100.0000 mg | ORAL_TABLET | Freq: Once | ORAL | 0 refills | Status: DC
  Filled 2024-02-27: qty 1, 1d supply, fill #0

## 2024-02-27 NOTE — H&P (View-Only) (Signed)
 Cardiology Office Note:  .   Date:  02/27/2024  ID:  Thomas Gardner, DOB 05/14/1956, MRN [id number] PCP: Maree Leni Edyth DELENA, MD  [facility] Providers Cardiologist:  Annabella Scarce, MD    History of Present Illness: .   Thomas Gardner is a 68 y.o. 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   Myoview 2013 LVEF 37%, no ischemia.  Echo 11/2020 LVEF 55-40%, grade 1 diastolic dysfunction.  Admitted 02/2021 for acute ischemic stroke of left thalamic pyramid.  He severe stenosis proximal left external carotid artery and multifocal moderate to severe stenosis of the right posterior cerebral artery.  Treated with aspirin  Plavix  for 3 months then aspirin  alone.  Echo on admission LVEF 50%.  Required diuretics during hospitalization for diuresis.  Losartan  previously increased for BP control.  He also has displayed multiple of his medications to reduce lightheadedness, dizziness.  At last visit 08/2021 he was recommended to start rosuvastatin  3 times per week and continue Repatha .  Seen 12/10/22 and 7 day clinic pursued to assess PVC burden. Monitor with 9.6% PVC burden and was recommended to stop Carvedilol , start Metoprolol  tartrate - he was unable to be reached with these recommendations despite leaving 3 voicemails. Echo due to LE edema 01/01/23 stable LVEF 50% with no significant valvular abnormalities. Itamar home sleep study ordered as did not tolerate CPAP but interested in alternate therapies, but he did not complete.  At visit 04/2023 was given PIN for Itamar but did not complete. For better control of symptomatic PVC, Carvedilol  was transitioned for Metoprolol .   He was seen 01/13/2024 with bilateral lower extremity swelling for 3 weeks despite  taking furosemide  10 mg twice daily.  On exam trace bilateral pretibial edema. He had self adjusted losartan  to half tablet 3 times daily to prevent lightheadedness.  Was not monitoring BP at home.  He had recently completed course of colchicine  for gout.  His BP not at goal losartan  stopped and olmesartan  20 mg nightly initiated.  Due to edema updated echocardiogram 02/16/2024 LVEF 35-40%, LV global hypokinesis, mild LVH, RV normal, mild LAE, trivial MR.  He presents today for follow-up. He reports continued bilateral lower extremity edema with associated leg heaviness unsteadiness on his feet related to edema.  He was advised to increase furosemide  for 2 days in August 2025 and did not notice significant improvement. He wears compression stockings during the day with some improvement. Swelling dissipates overnight however within an hour of being on his feet in the morning the swelling returns. He has been working on dietary modifications such as reducing intake of deli meat, bacon, sausage intake and decreasing portion sizes.  He does not add salt to his food.  He he drinks 6-8 bottles of water daily. 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  He notes his A1c has improved into the 8% range and home blood sugars upon waking in the 110s-120s.  He does not check his blood pressure at home as he reports his cuff is not functioning. He expresses concern today about lightheadedness in the mornings which he attributes to metoprolol  tartrate.  He has been splitting several medications to take multiple times a day such as metoprolol  tartrate 25 mg 4 times daily, Jardiance , olmesartan . Discussed antihypertensive regimen unlikely to  cause lightheadedness as BP persistently elevated. He reports getting up several times during the night to urinate and he has not been taking his Flomax  regularly.  He was ordered a home sleep test which was not completed. He reports daytime fatigue and  witnessed apneic spells.  He denies chest pain, chest pressure, shortness of breath and palpitations.  He reports metoprolol  has helped with the palpitations.  He notes he has an appointment with [facility] in 2 weeks  ROS: Please see the history of present illness.    All other systems reviewed and are negative.   Studies Reviewed: .        Cardiac Studies & Procedures   ______________________________________________________________________________________________   STRESS TESTS  MYOCARDIAL PERFUSION IMAGING 08/15/2011   ECHOCARDIOGRAM  ECHOCARDIOGRAM COMPLETE 02/11/2024  Narrative ECHOCARDIOGRAM REPORT    Patient Name:   Thomas Gardner Date of Exam: 02/11/2024 Medical Rec #:  [id number]       Height:       66.0 in Accession #:    [id number]      Weight:       231.0 lb Date of Birth:  1955/06/16       BSA:          2.126 m Patient Age:    68 years        BP:           160/70 mmHg Patient Gender: M               HR:           72 bpm. Exam Location:  Outpatient  Procedure: 2D Echo, 3D Echo, Color Doppler and Cardiac Doppler (Both Spectral and Color Flow Doppler were utilized during procedure).  Indications:    Dyspnea  History:        Patient has prior history of Echocardiogram examinations, most recent 01/01/2023. Stroke; Risk Factors:Diabetes, Hypertension, Dyslipidemia and Non-Smoker. HFrEF.  Sonographer:    Orvil Holmes RDCS Referring Phys: [id number] Edelmira Gallogly S Aaran Enberg  IMPRESSIONS   1. Left ventricular ejection fraction, by estimation, is 35 to 40%. The left ventricle has moderately decreased function. The left ventricle demonstrates global hypokinesis. There is mild left ventricular hypertrophy. Left ventricular diastolic parameters are indeterminate. 2. Right ventricular systolic function is normal. The right ventricular size is normal. Tricuspid regurgitation signal is inadequate for assessing PA pressure. 3. Left atrial size was mildly dilated. 4. The  mitral valve is grossly normal. Trivial mitral valve regurgitation. No evidence of mitral stenosis. 5. The aortic valve is tricuspid. There is mild calcification of the aortic valve. Aortic valve regurgitation is not visualized. No aortic stenosis is present. 6. The inferior vena cava is normal in size with greater than 50% respiratory variability, suggesting right atrial pressure of 3 mmHg.  Comparison(s): Prior images reviewed side by side. Changes from prior study are noted. LVEF appears reduced.  FINDINGS Left Ventricle: Left ventricular ejection fraction, by estimation, is 35 to 40%. The left ventricle has moderately decreased function. The left ventricle demonstrates global hypokinesis. 3D ejection fraction reviewed and evaluated as part of the interpretation. Alternate measurement of EF is felt to be most reflective of LV function. The left ventricular internal cavity size was normal in size. There is mild left ventricular hypertrophy. Left ventricular diastolic parameters are indeterminate.  Right Ventricle: The right ventricular size is normal. No increase in right ventricular wall thickness. Right ventricular systolic function is normal. Tricuspid regurgitation signal is inadequate for assessing PA pressure.  Left Atrium: Left atrial size was mildly dilated.  Right Atrium: Right atrial size was normal in size.  Pericardium: Trivial pericardial effusion is present.  Mitral Valve: The mitral valve is grossly normal. Trivial mitral valve regurgitation. No evidence of mitral valve stenosis.  Tricuspid Valve: The tricuspid valve is normal in structure. Tricuspid valve regurgitation is trivial. No evidence of tricuspid stenosis.  Aortic Valve: The aortic valve is tricuspid. There is mild calcification of the aortic valve. Aortic valve regurgitation is not visualized. No aortic stenosis is present.  Pulmonic Valve: The pulmonic valve was normal in structure. Pulmonic valve regurgitation is  trivial. No evidence of pulmonic stenosis.  Aorta: The aortic root is normal in size and structure. Ascending aorta measurements are within normal limits for age when indexed to body surface area.  Venous: The inferior vena cava is normal in size with greater than 50% respiratory variability, suggesting right atrial pressure of 3 mmHg.  IAS/Shunts: No atrial level shunt detected by color flow Doppler.  Additional Comments: 3D was performed not requiring image post processing on an independent workstation and was abnormal.   LEFT VENTRICLE PLAX 2D LVIDd:         5.23 cm      Diastology LVIDs:         4.65 cm      LV e' medial:    10.90 cm/s LV PW:         1.09 cm      LV E/e' medial:  9.6 LV IVS:        0.86 cm      LV e' lateral:   15.90 cm/s LVOT diam:     2.10 cm      LV E/e' lateral: 6.6 LV SV:         34 LV SV Index:   16 LVOT Area:     3.46 cm  3D Volume EF: LV Volumes (MOD)            3D EF:        44 % LV vol d, MOD A2C: 215.0 ml LV EDV:       222 ml LV vol d, MOD A4C: 183.0 ml LV ESV:       124 ml LV vol s, MOD A2C: 73.7 ml  LV SV:        98 ml LV vol s, MOD A4C: 93.1 ml LV SV MOD A2C:     141.3 ml LV SV MOD A4C:     183.0 ml LV SV MOD BP:      118.6 ml  RIGHT VENTRICLE RV Basal diam:  2.57 cm     PULMONARY VEINS RV Mid diam:    2.10 cm     A Reversal Velocity: 55.10 cm/s RV S prime:     12.20 cm/s  Diastolic Velocity:  40.80 cm/s TAPSE (M-mode): 2.0 cm      S/D Velocity:        1.20 Systolic Velocity:   49.40 cm/s  LEFT ATRIUM             Index        RIGHT ATRIUM           Index LA diam:        4.60 cm 2.16 cm/m   RA Area:     16.80 cm LA Vol (A2C):   84.4 ml 39.70 ml/m  RA Volume:   50.10 ml  23.56 ml/m LA Vol (A4C):   74.6  ml 35.09 ml/m LA Biplane Vol: 81.6 ml 38.38 ml/m AORTIC VALVE LVOT Vmax:   57.80 cm/s LVOT Vmean:  36.600 cm/s LVOT VTI:    0.098 m  AORTA Ao Root diam: 2.90 cm Ao Asc diam:  3.70 cm  MITRAL VALVE MV Area (PHT): 6.27 cm      SHUNTS MV Decel Time: 121 msec     Systemic VTI:  0.10 m MV E velocity: 105.00 cm/s  Systemic Diam: 2.10 cm MV A velocity: 54.90 cm/s MV E/A ratio:  1.91  Soyla Merck MD Electronically signed by Soyla Merck MD Signature Date/Time: 02/15/2024/12:17:10 PM    Final    MONITORS  LONG TERM MONITOR (3-14 DAYS) 12/30/2022  Narrative 7 Day Zio Monitor  Quality: Fair.  Baseline artifact. Predominant rhythm: sinus rhythm Average heart rate: 85 bpm Max heart rate: 128 bpm Min heart rate: 59 bpm Pauses >2.5 seconds: none  Rare PACs (<1%) Frequent PVCs (9.6%) Rare ventricular couplets and triplets (<1%) Up to 13 beats NSVT Up to 6 beats SVT   Tiffany C. Raford, MD, [facility] 02/09/2023 8:40 AM       ______________________________________________________________________________________________          Risk Assessment/Calculations:        STOP-Bang Score:         Physical Exam:   VS:  BP (!) 162/90 (BP Location: Right Arm, Patient Position: Sitting, Cuff Size: Normal)   Pulse 91   Resp 17   Ht 5' 6 (1.676 m)   Wt 234 lb (106.1 kg)   SpO2 98%   BMI 37.77 kg/m    Wt Readings from Last 3 Encounters:  02/27/24 234 lb (106.1 kg)  01/13/24 231 lb (104.8 kg)  07/02/23 228 lb 3.2 oz (103.5 kg)    GEN: Well nourished, overweight, well developed in no acute distress NECK: No JVD; No carotid bruits CARDIAC: RRR, no murmurs, rubs, gallops; radial pulses 2+ bilaterally; PT pulses 2+ bilaterally RESPIRATORY:  Clear to auscultation without rales, wheezing or rhonchi  ABDOMEN: Soft, non-tender, non-distended EXTREMITIES:  Trace nonpitting LE edema; No deformity   ASSESSMENT AND PLAN: .    PVC - Monitor 12/2021 with PVC burden 9.6%. Symptomatic with PVC.  Improved since transition to Metoprolol  tartrate 50mg  BID.  Denies palpitations today To simplify medication regimen will change metoprolol  to tartrate 50 mg twice daily to metoprolol  succinate 50 mg once daily in the  evening  Hx of CVA - continue aspirin  81mg  daily, Repatha  140mg  q14 days.   HFrEF - echocardiogram 12/2023 with LVEF 35 to 40% mild LVH mildly dilated left atria. Newly reduced LVEF. NYHA II. Predominantly symptomatic with LE edema.   Continue GDMT: Furosemide  20 mg daily, Jardiance  25 mg daily Discontnue olmesartan , Start Entresto 49/51 mg twice daily Discontinue metoprolol  tartrate 50 mg, Start metoprolol  succinate 50 mg every evening BMET, BNP due 1-2 weeks Consider addition of spironolactone pending lab results Cardiac CTA ordered for systolic heart failure/HFrEF to rule out ischemia as etiology.  Advised to take metoprolol  to tartrate 50 mg morning of test.  Gout - follows with podiatry.  Familial hyperlipidemia - 12/2023 LDL 119.  On Repatha  every 2 weeks.   Will reassess LDL goal at follow-up visit.  Consider addition of statin, as no documented intolerance. Not addressed at this clinic visit.   HTN, goal <130/80 - not at goal by in office reading today.  Does not monitor blood pressure at home. 01/30/24: creat 1.66, Na 138, K 3.8 Start Entresto 49/51mg  BID  as above Continue metoprolol  succinate 50 mg daily as above. Repeat BMET in 1 to 2 weeks.  Consider addition of spironolactone based on lab results for HFrEF and HTN  OSA - Snores, witnessed apneic spells, daytime somnolence, nocturia. Previously did not tolerate CPAP many years ago.  Interested in trying newer CPAP vs inspire device. He is interested in completing home sleep test. Re-order Itamar today for preauthorization.  He is aware that it can take 6 to 8 weeks for preauthorization and testing.  Discussed nocturia in setting of untreated sleep apnea and encouraged compliance with Flomax .       Dispo: follow up in 6-8 with Reche GORMAN Finder, NP   Signed, Reche GORMAN Finder, NP

## 2024-02-27 NOTE — Progress Notes (Signed)
 Cardiology Office Note:  .   Date:  02/27/2024  ID:  Thomas Gardner, DOB 05/14/1956, MRN [id number] PCP: Maree Leni Edyth DELENA, MD  [facility] Providers Cardiologist:  Annabella Scarce, MD    History of Present Illness: .   Thomas Gardner is a 68 y.o. 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   Myoview 2013 LVEF 37%, no ischemia.  Echo 11/2020 LVEF 55-40%, grade 1 diastolic dysfunction.  Admitted 02/2021 for acute ischemic stroke of left thalamic pyramid.  He severe stenosis proximal left external carotid artery and multifocal moderate to severe stenosis of the right posterior cerebral artery.  Treated with aspirin  Plavix  for 3 months then aspirin  alone.  Echo on admission LVEF 50%.  Required diuretics during hospitalization for diuresis.  Losartan  previously increased for BP control.  He also has displayed multiple of his medications to reduce lightheadedness, dizziness.  At last visit 08/2021 he was recommended to start rosuvastatin  3 times per week and continue Repatha .  Seen 12/10/22 and 7 day clinic pursued to assess PVC burden. Monitor with 9.6% PVC burden and was recommended to stop Carvedilol , start Metoprolol  tartrate - he was unable to be reached with these recommendations despite leaving 3 voicemails. Echo due to LE edema 01/01/23 stable LVEF 50% with no significant valvular abnormalities. Itamar home sleep study ordered as did not tolerate CPAP but interested in alternate therapies, but he did not complete.  At visit 04/2023 was given PIN for Itamar but did not complete. For better control of symptomatic PVC, Carvedilol  was transitioned for Metoprolol .   He was seen 01/13/2024 with bilateral lower extremity swelling for 3 weeks despite  taking furosemide  10 mg twice daily.  On exam trace bilateral pretibial edema. He had self adjusted losartan  to half tablet 3 times daily to prevent lightheadedness.  Was not monitoring BP at home.  He had recently completed course of colchicine  for gout.  His BP not at goal losartan  stopped and olmesartan  20 mg nightly initiated.  Due to edema updated echocardiogram 02/16/2024 LVEF 35-40%, LV global hypokinesis, mild LVH, RV normal, mild LAE, trivial MR.  He presents today for follow-up. He reports continued bilateral lower extremity edema with associated leg heaviness unsteadiness on his feet related to edema.  He was advised to increase furosemide  for 2 days in August 2025 and did not notice significant improvement. He wears compression stockings during the day with some improvement. Swelling dissipates overnight however within an hour of being on his feet in the morning the swelling returns. He has been working on dietary modifications such as reducing intake of deli meat, bacon, sausage intake and decreasing portion sizes.  He does not add salt to his food.  He he drinks 6-8 bottles of water daily. 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  He notes his A1c has improved into the 8% range and home blood sugars upon waking in the 110s-120s.  He does not check his blood pressure at home as he reports his cuff is not functioning. He expresses concern today about lightheadedness in the mornings which he attributes to metoprolol  tartrate.  He has been splitting several medications to take multiple times a day such as metoprolol  tartrate 25 mg 4 times daily, Jardiance , olmesartan . Discussed antihypertensive regimen unlikely to  cause lightheadedness as BP persistently elevated. He reports getting up several times during the night to urinate and he has not been taking his Flomax  regularly.  He was ordered a home sleep test which was not completed. He reports daytime fatigue and  witnessed apneic spells.  He denies chest pain, chest pressure, shortness of breath and palpitations.  He reports metoprolol  has helped with the palpitations.  He notes he has an appointment with [facility] in 2 weeks  ROS: Please see the history of present illness.    All other systems reviewed and are negative.   Studies Reviewed: .        Cardiac Studies & Procedures   ______________________________________________________________________________________________   STRESS TESTS  MYOCARDIAL PERFUSION IMAGING 08/15/2011   ECHOCARDIOGRAM  ECHOCARDIOGRAM COMPLETE 02/11/2024  Narrative ECHOCARDIOGRAM REPORT    Patient Name:   Thomas Gardner Date of Exam: 02/11/2024 Medical Rec #:  [id number]       Height:       66.0 in Accession #:    [id number]      Weight:       231.0 lb Date of Birth:  1955/06/16       BSA:          2.126 m Patient Age:    68 years        BP:           160/70 mmHg Patient Gender: M               HR:           72 bpm. Exam Location:  Outpatient  Procedure: 2D Echo, 3D Echo, Color Doppler and Cardiac Doppler (Both Spectral and Color Flow Doppler were utilized during procedure).  Indications:    Dyspnea  History:        Patient has prior history of Echocardiogram examinations, most recent 01/01/2023. Stroke; Risk Factors:Diabetes, Hypertension, Dyslipidemia and Non-Smoker. HFrEF.  Sonographer:    Orvil Holmes RDCS Referring Phys: [id number] Edelmira Gallogly S Aaran Enberg  IMPRESSIONS   1. Left ventricular ejection fraction, by estimation, is 35 to 40%. The left ventricle has moderately decreased function. The left ventricle demonstrates global hypokinesis. There is mild left ventricular hypertrophy. Left ventricular diastolic parameters are indeterminate. 2. Right ventricular systolic function is normal. The right ventricular size is normal. Tricuspid regurgitation signal is inadequate for assessing PA pressure. 3. Left atrial size was mildly dilated. 4. The  mitral valve is grossly normal. Trivial mitral valve regurgitation. No evidence of mitral stenosis. 5. The aortic valve is tricuspid. There is mild calcification of the aortic valve. Aortic valve regurgitation is not visualized. No aortic stenosis is present. 6. The inferior vena cava is normal in size with greater than 50% respiratory variability, suggesting right atrial pressure of 3 mmHg.  Comparison(s): Prior images reviewed side by side. Changes from prior study are noted. LVEF appears reduced.  FINDINGS Left Ventricle: Left ventricular ejection fraction, by estimation, is 35 to 40%. The left ventricle has moderately decreased function. The left ventricle demonstrates global hypokinesis. 3D ejection fraction reviewed and evaluated as part of the interpretation. Alternate measurement of EF is felt to be most reflective of LV function. The left ventricular internal cavity size was normal in size. There is mild left ventricular hypertrophy. Left ventricular diastolic parameters are indeterminate.  Right Ventricle: The right ventricular size is normal. No increase in right ventricular wall thickness. Right ventricular systolic function is normal. Tricuspid regurgitation signal is inadequate for assessing PA pressure.  Left Atrium: Left atrial size was mildly dilated.  Right Atrium: Right atrial size was normal in size.  Pericardium: Trivial pericardial effusion is present.  Mitral Valve: The mitral valve is grossly normal. Trivial mitral valve regurgitation. No evidence of mitral valve stenosis.  Tricuspid Valve: The tricuspid valve is normal in structure. Tricuspid valve regurgitation is trivial. No evidence of tricuspid stenosis.  Aortic Valve: The aortic valve is tricuspid. There is mild calcification of the aortic valve. Aortic valve regurgitation is not visualized. No aortic stenosis is present.  Pulmonic Valve: The pulmonic valve was normal in structure. Pulmonic valve regurgitation is  trivial. No evidence of pulmonic stenosis.  Aorta: The aortic root is normal in size and structure. Ascending aorta measurements are within normal limits for age when indexed to body surface area.  Venous: The inferior vena cava is normal in size with greater than 50% respiratory variability, suggesting right atrial pressure of 3 mmHg.  IAS/Shunts: No atrial level shunt detected by color flow Doppler.  Additional Comments: 3D was performed not requiring image post processing on an independent workstation and was abnormal.   LEFT VENTRICLE PLAX 2D LVIDd:         5.23 cm      Diastology LVIDs:         4.65 cm      LV e' medial:    10.90 cm/s LV PW:         1.09 cm      LV E/e' medial:  9.6 LV IVS:        0.86 cm      LV e' lateral:   15.90 cm/s LVOT diam:     2.10 cm      LV E/e' lateral: 6.6 LV SV:         34 LV SV Index:   16 LVOT Area:     3.46 cm  3D Volume EF: LV Volumes (MOD)            3D EF:        44 % LV vol d, MOD A2C: 215.0 ml LV EDV:       222 ml LV vol d, MOD A4C: 183.0 ml LV ESV:       124 ml LV vol s, MOD A2C: 73.7 ml  LV SV:        98 ml LV vol s, MOD A4C: 93.1 ml LV SV MOD A2C:     141.3 ml LV SV MOD A4C:     183.0 ml LV SV MOD BP:      118.6 ml  RIGHT VENTRICLE RV Basal diam:  2.57 cm     PULMONARY VEINS RV Mid diam:    2.10 cm     A Reversal Velocity: 55.10 cm/s RV S prime:     12.20 cm/s  Diastolic Velocity:  40.80 cm/s TAPSE (M-mode): 2.0 cm      S/D Velocity:        1.20 Systolic Velocity:   49.40 cm/s  LEFT ATRIUM             Index        RIGHT ATRIUM           Index LA diam:        4.60 cm 2.16 cm/m   RA Area:     16.80 cm LA Vol (A2C):   84.4 ml 39.70 ml/m  RA Volume:   50.10 ml  23.56 ml/m LA Vol (A4C):   74.6  ml 35.09 ml/m LA Biplane Vol: 81.6 ml 38.38 ml/m AORTIC VALVE LVOT Vmax:   57.80 cm/s LVOT Vmean:  36.600 cm/s LVOT VTI:    0.098 m  AORTA Ao Root diam: 2.90 cm Ao Asc diam:  3.70 cm  MITRAL VALVE MV Area (PHT): 6.27 cm      SHUNTS MV Decel Time: 121 msec     Systemic VTI:  0.10 m MV E velocity: 105.00 cm/s  Systemic Diam: 2.10 cm MV A velocity: 54.90 cm/s MV E/A ratio:  1.91  Soyla Merck MD Electronically signed by Soyla Merck MD Signature Date/Time: 02/15/2024/12:17:10 PM    Final    MONITORS  LONG TERM MONITOR (3-14 DAYS) 12/30/2022  Narrative 7 Day Zio Monitor  Quality: Fair.  Baseline artifact. Predominant rhythm: sinus rhythm Average heart rate: 85 bpm Max heart rate: 128 bpm Min heart rate: 59 bpm Pauses >2.5 seconds: none  Rare PACs (<1%) Frequent PVCs (9.6%) Rare ventricular couplets and triplets (<1%) Up to 13 beats NSVT Up to 6 beats SVT   Tiffany C. Raford, MD, [facility] 02/09/2023 8:40 AM       ______________________________________________________________________________________________          Risk Assessment/Calculations:        STOP-Bang Score:         Physical Exam:   VS:  BP (!) 162/90 (BP Location: Right Arm, Patient Position: Sitting, Cuff Size: Normal)   Pulse 91   Resp 17   Ht 5' 6 (1.676 m)   Wt 234 lb (106.1 kg)   SpO2 98%   BMI 37.77 kg/m    Wt Readings from Last 3 Encounters:  02/27/24 234 lb (106.1 kg)  01/13/24 231 lb (104.8 kg)  07/02/23 228 lb 3.2 oz (103.5 kg)    GEN: Well nourished, overweight, well developed in no acute distress NECK: No JVD; No carotid bruits CARDIAC: RRR, no murmurs, rubs, gallops; radial pulses 2+ bilaterally; PT pulses 2+ bilaterally RESPIRATORY:  Clear to auscultation without rales, wheezing or rhonchi  ABDOMEN: Soft, non-tender, non-distended EXTREMITIES:  Trace nonpitting LE edema; No deformity   ASSESSMENT AND PLAN: .    PVC - Monitor 12/2021 with PVC burden 9.6%. Symptomatic with PVC.  Improved since transition to Metoprolol  tartrate 50mg  BID.  Denies palpitations today To simplify medication regimen will change metoprolol  to tartrate 50 mg twice daily to metoprolol  succinate 50 mg once daily in the  evening  Hx of CVA - continue aspirin  81mg  daily, Repatha  140mg  q14 days.   HFrEF - echocardiogram 12/2023 with LVEF 35 to 40% mild LVH mildly dilated left atria. Newly reduced LVEF. NYHA II. Predominantly symptomatic with LE edema.   Continue GDMT: Furosemide  20 mg daily, Jardiance  25 mg daily Discontnue olmesartan , Start Entresto 49/51 mg twice daily Discontinue metoprolol  tartrate 50 mg, Start metoprolol  succinate 50 mg every evening BMET, BNP due 1-2 weeks Consider addition of spironolactone pending lab results Cardiac CTA ordered for systolic heart failure/HFrEF to rule out ischemia as etiology.  Advised to take metoprolol  to tartrate 50 mg morning of test.  Gout - follows with podiatry.  Familial hyperlipidemia - 12/2023 LDL 119.  On Repatha  every 2 weeks.   Will reassess LDL goal at follow-up visit.  Consider addition of statin, as no documented intolerance. Not addressed at this clinic visit.   HTN, goal <130/80 - not at goal by in office reading today.  Does not monitor blood pressure at home. 01/30/24: creat 1.66, Na 138, K 3.8 Start Entresto 49/51mg  BID  as above Continue metoprolol  succinate 50 mg daily as above. Repeat BMET in 1 to 2 weeks.  Consider addition of spironolactone based on lab results for HFrEF and HTN  OSA - Snores, witnessed apneic spells, daytime somnolence, nocturia. Previously did not tolerate CPAP many years ago.  Interested in trying newer CPAP vs inspire device. He is interested in completing home sleep test. Re-order Itamar today for preauthorization.  He is aware that it can take 6 to 8 weeks for preauthorization and testing.  Discussed nocturia in setting of untreated sleep apnea and encouraged compliance with Flomax .       Dispo: follow up in 6-8 with Reche GORMAN Finder, NP   Signed, Reche GORMAN Finder, NP

## 2024-02-27 NOTE — Patient Instructions (Addendum)
 Medication Instructions:   STOP Olmesartan   START Entresto 49-51mg  twice daily  STOP Metoprolol  Tartrate  START Metoprolol  Succinate 50mg  once daily  Continue Furosemide  (Lasix ) 20mg  daily  Medication Schedule:  Morning (try to take with a small snack) Entresto 49-51mg  Furosemide  20mg  Jardiance  25mg   Evening Entresto 49-51mg  Metoprolol  Succinate 50mg  Flomax  0.4mg    *If you need a refill on your cardiac medications before your next appointment, please call your pharmacy*  Lab Work:  Your physician recommends that you return for lab work in 1-2 weeks BMET, BNP  If you have labs (blood work) drawn today and your tests are completely normal, you will receive your results only by: MyChart Message (if you have MyChart) OR A paper copy in the mail If you have any lab test that is abnormal or we need to change your treatment, we will call you to review the results.  Testing/Procedures:  Your provider has recommended cardiac CTA. INSTRUCTIONS ARE BELOW TO FOLLOW/PREPARE FOR CARDIAC CT WHEN SCHEDULED   Follow-Up:  Your next appointment:   6-8 week(s)  Provider:   Annabella Scarce, MD, Rosaline Bane, NP, or Reche Finder, NP      Other Instructions  Recommend calling the number on the back of your insurance card to see if you have an 'over the counter benefit' for purchasing a blood pressure cuff. If you do not, let us  know.        Your cardiac CT will be scheduled at one of the below locations:     [facility] [address]  [place], [place] [address] [phone]   If scheduled at the [facility] V[facility] at [facility] street, please enter the parking lot using the [address] entrance and use the FREE valet service at the patient drop-off area. Enter the building and check-in with registration on the main floor.    Please follow these instructions carefully (unless otherwise directed):  An IV will be required  for this test and Nitroglycerin will be given.  Hold all erectile dysfunction medications at least 3 days (72 hrs) prior to test. (Ie viagra, cialis, sildenafil, tadalafil, etc)   On the Night Before the Test: Be sure to Drink plenty of water. Do not consume any caffeinated/decaffeinated beverages or chocolate 12 hours prior to your test. Do not take any antihistamines 12 hours prior to your test.    On the Day of the Test: Drink plenty of water until 1 hour prior to the test. Do not eat any food 1 hour prior to test. You may take your regular medications prior to the test.  Take metoprolol  TARTRATE 50 MG (Lopressor ) BY MOUTH two hours prior to test.  YOU MAY TAKE YOUR REGULAR SCHEDULED METOPROLOL  SUCCINATE (TOPROL  XL) 50 MG THAT EVENING AFTER YOUR CT SCAN If you take Furosemide  please HOLD on the morning of the test. Patients who wear a continuous glucose monitor MUST remove the device prior to scanning.        After the Test: Drink plenty of water. After receiving IV contrast, you may experience a mild flushed feeling. This is normal. On occasion, you may experience a mild rash up to 24 hours after the test. This is not dangerous. If this occurs, you can take Benadryl  25 mg, Zyrtec , Claritin, or Allegra and increase your fluid intake. (Patients taking Tikosyn should avoid Benadryl , and may take Zyrtec , Claritin, or Allegra) If you experience trouble breathing, this can be serious. If it is severe call  911 IMMEDIATELY. If it is mild, please call our office.  We will call to schedule your test 2-4 weeks out understanding that some insurance companies will need an authorization prior to the service being performed.   For more information and frequently asked questions, please visit our website : [email or web address]  For non-scheduling related questions, please contact the cardiac imaging nurse navigator should you have any questions/concerns: Cardiac Imaging Nurse  Navigators Direct Office Dial : [phone]   For scheduling needs, including cancellations and rescheduling, please call [place], [phone].

## 2024-03-01 ENCOUNTER — Other Ambulatory Visit (HOSPITAL_COMMUNITY): Payer: Self-pay

## 2024-03-02 ENCOUNTER — Encounter (HOSPITAL_COMMUNITY): Payer: Self-pay

## 2024-03-02 ENCOUNTER — Other Ambulatory Visit (HOSPITAL_COMMUNITY): Payer: Self-pay

## 2024-03-04 ENCOUNTER — Encounter (HOSPITAL_BASED_OUTPATIENT_CLINIC_OR_DEPARTMENT_OTHER): Payer: Self-pay

## 2024-03-04 ENCOUNTER — Ambulatory Visit (HOSPITAL_COMMUNITY)
Admission: RE | Admit: 2024-03-04 | Discharge: 2024-03-04 | Disposition: A | Discharge status: Home / Self Care | Source: Ambulatory Visit | Attending: Family | Admitting: Family

## 2024-03-04 ENCOUNTER — Other Ambulatory Visit (HOSPITAL_COMMUNITY): Payer: Self-pay

## 2024-03-04 DIAGNOSIS — I5022 Chronic systolic (congestive) heart failure: Secondary | ICD-10-CM | POA: Insufficient documentation

## 2024-03-04 DIAGNOSIS — Z8673 Personal history of transient ischemic attack (TIA), and cerebral infarction without residual deficits: Secondary | ICD-10-CM | POA: Diagnosis not present

## 2024-03-04 DIAGNOSIS — I493 Ventricular premature depolarization: Secondary | ICD-10-CM | POA: Diagnosis not present

## 2024-03-04 DIAGNOSIS — G4733 Obstructive sleep apnea (adult) (pediatric): Secondary | ICD-10-CM | POA: Diagnosis not present

## 2024-03-04 DIAGNOSIS — I2510 Atherosclerotic heart disease of native coronary artery without angina pectoris: Secondary | ICD-10-CM | POA: Insufficient documentation

## 2024-03-04 DIAGNOSIS — I2584 Coronary atherosclerosis due to calcified coronary lesion: Secondary | ICD-10-CM | POA: Insufficient documentation

## 2024-03-04 DIAGNOSIS — I5020 Unspecified systolic (congestive) heart failure: Secondary | ICD-10-CM

## 2024-03-04 MED ORDER — IOHEXOL 350 MG/ML SOLN
100.0000 mL | Freq: Once | INTRAVENOUS | Status: AC | PRN
  Administered 2024-03-04: 100 mL via INTRAVENOUS

## 2024-03-04 MED ORDER — NITROGLYCERIN 0.4 MG SL SUBL: 0.8000 mg | SUBLINGUAL_TABLET | Freq: Once | SUBLINGUAL | Status: DC

## 2024-03-04 MED ORDER — DILTIAZEM HCL 25 MG/5ML IV SOLN: 10.0000 mg | INTRAVENOUS | Status: DC | PRN

## 2024-03-04 MED ORDER — AMOXICILLIN-POT CLAVULANATE 875-125 MG PO TABS
1.0000 | ORAL_TABLET | Freq: Two times a day (BID) | ORAL | 0 refills | Status: DC
  Filled 2024-03-04: qty 20, 10d supply, fill #0

## 2024-03-04 MED ORDER — METOPROLOL TARTRATE 5 MG/5ML IV SOLN
10.0000 mg | Freq: Once | INTRAVENOUS | Status: AC | PRN
Start: 2024-03-04 — End: 2024-03-04
  Administered 2024-03-04: 10 mg via INTRAVENOUS

## 2024-03-04 MED ORDER — DILTIAZEM HCL 25 MG/5ML IV SOLN
INTRAVENOUS | Status: AC
  Administered 2024-03-04: 10 mg via INTRAVENOUS
  Filled 2024-03-04: qty 5

## 2024-03-05 ENCOUNTER — Ambulatory Visit (HOSPITAL_BASED_OUTPATIENT_CLINIC_OR_DEPARTMENT_OTHER): Payer: Self-pay | Admitting: Family

## 2024-03-05 ENCOUNTER — Ambulatory Visit (HOSPITAL_COMMUNITY)
Admission: RE | Admit: 2024-03-05 | Discharge: 2024-03-05 | Disposition: A | Source: Ambulatory Visit | Attending: Cardiovascular Disease

## 2024-03-05 ENCOUNTER — Other Ambulatory Visit: Payer: Self-pay | Admitting: Cardiovascular Disease

## 2024-03-05 DIAGNOSIS — I2510 Atherosclerotic heart disease of native coronary artery without angina pectoris: Secondary | ICD-10-CM

## 2024-03-05 DIAGNOSIS — R931 Abnormal findings on diagnostic imaging of heart and coronary circulation: Secondary | ICD-10-CM | POA: Diagnosis not present

## 2024-03-05 DIAGNOSIS — Z794 Long term (current) use of insulin: Secondary | ICD-10-CM

## 2024-03-05 DIAGNOSIS — Z01812 Encounter for preprocedural laboratory examination: Secondary | ICD-10-CM

## 2024-03-08 ENCOUNTER — Other Ambulatory Visit (HOSPITAL_BASED_OUTPATIENT_CLINIC_OR_DEPARTMENT_OTHER): Payer: Self-pay | Admitting: Family

## 2024-03-08 NOTE — Addendum Note (Signed)
 Addended by: GLADIS PORTER HERO on: 03/08/2024 04:42 PM   Modules accepted: Orders

## 2024-03-08 NOTE — Telephone Encounter (Signed)
-----   Message from Reche GORMAN Finder sent at 03/05/2024  5:04 PM EDT ----- Cardiac CTA with calcium  score of 2810.  Severe stenosis noted in RCA and distal LAD with moderate stenosis in the proximal/mid LAD and LCx. Suggestive of ischemia by FFR.   Recommend cardiac catheterization.   Attempted to call 03/05/24 - no VM set up so unable to leave a message ----- Message ----- From: Interface, Rad Results In Sent: 03/05/2024   3:23 PM EDT To: Reche GORMAN Finder, NP

## 2024-03-08 NOTE — Telephone Encounter (Signed)
 Reviewed CCTA result.   Informed Consent   Shared Decision Making/Informed Consent The risks [stroke (1 in 1000), death (1 in 1000), kidney failure [usually temporary] (1 in 500), bleeding (1 in 200), allergic reaction [possibly serious] (1 in 200)], benefits (diagnostic support and management of coronary artery disease) and alternatives of a cardiac catheterization were discussed in detail with Mr. Willetts and he is willing to proceed.     Last took Jardiance  02/04/24 Last took Mounjaro  01/29/24   He is aware to stay off these medications until [facility].   Famous Eisenhardt S Feleica Fulmore, NP

## 2024-03-08 NOTE — Telephone Encounter (Signed)
 The patient has been notified of the result and verbalized understanding.  All questions (if any) were answered.  Pt will have LHC on this Thursday 10/23 at 0900 with Dr. Darron, arrive 0700.    Pt aware to come in for pre-procedure lab to check BMET and CBC W DIFF.    Will send staff message to Reche Finder, NP and pre-cert team to place hospital orders and pre-cert procedure for 10/23.   Pt made aware of entire plan via telephone call by Reche Finder, NP.   Pt aware we will send him his cath instructions to his mychart account to review and refer to.    [facility] VASCULAR AT [facility] [address] [place] [address] Dept: [phone]  UCHECHUKWU DHAWAN  03/08/2024  You are scheduled for a Cardiac Catheterization on Thursday, October 23 with Dr. Deatrice Darron.  1. Please arrive at the [facility] (Main Entrance A) at [facility]: [address] [place], [place] [address] at 7:00 AM (This time is 2 hour(s) before your procedure to ensure your preparation).   Free valet parking service is available. You will check in at ADMITTING. The support person will be asked to wait in the waiting room.  It is OK to have someone drop you off and come back when you are ready to be discharged.    Special note: Every effort is made to have your procedure done on time. Please understand that emergencies sometimes delay scheduled procedures.  2. Diet: Nothing to eat after midnight.   3. Hydration: On October 23, you may drink approved liquids (see below) until 2 hours before the procedure with 8 oz of water as your last intake.   List of approved liquids water, clear juice, clear tea, black coffee, fruit juices, non-citric and without pulp, carbonated beverages, Gatorade, Kool -Aid, plain Jello-O and plain ice popsicles.  4. Labs: You will need to have blood drawn on Tuesday, October 21 or Wed 10/22 at [facility] at [facility]:  [address] [place], [place] [address] (lab is in the Primary Care office located on the 3rd floor).  Hours: 8:00 am - 4:30 pm (avoid 12:00 pm - 1:00 pm)  Phone: [phone].  Tell staff you are there for blood work and they will direct you to the lab.  You do not need an appointment. You do not need to be fasting.  5. Medication instructions in preparation for your procedure:   Contrast Allergy: No   Stop taking, Lasix  (Furosemide )  Thursday, October 23,  CONTINUE HOLDING JARDIANCE  UNTIL AFTER YOUR PROCEDURE--YOU MAY TAKE NEXT SCHEDULED DOSE AFTER YOUR PROCEDURE  CONTINUE HOLDING MOUNJARO  AND YOU WILL TAKE NEXT SCHEDULED DOSE AFTER THE PROCEDURE  PLEASE HOLD ENTRESTO DAY BEFORE (HOLD ON 10/22) AND DAY OF PROCEDURE (HOLD ON 10/23)  Take only 10 units of insulin  the night before your procedure. Do not take any insulin  on the day of the procedure.    On the morning of your procedure, take your Aspirin  81 mg and any morning medicines NOT listed above.  You may use sips of water.  6. Plan to go home the same day, you will only stay overnight if medically necessary. 7. Bring a current list of your medications and current insurance cards. 8. You MUST have a responsible person to drive you home. 9. Someone MUST be with you the first 24 hours after you arrive home or  your discharge will be delayed. 10. Please wear clothes that are easy to get on and off and wear slip-on shoes.  Thank you for allowing us  to care for you!   -- [facility]

## 2024-03-10 ENCOUNTER — Other Ambulatory Visit (HOSPITAL_COMMUNITY): Payer: Self-pay

## 2024-03-10 ENCOUNTER — Telehealth: Payer: Self-pay | Admitting: *Deleted

## 2024-03-10 MED ORDER — POTASSIUM CHLORIDE CRYS ER 10 MEQ PO TBCR
10.0000 meq | EXTENDED_RELEASE_TABLET | Freq: Every day | ORAL | 0 refills | Status: DC
  Filled 2024-03-10: qty 90, 90d supply, fill #0

## 2024-03-10 NOTE — Telephone Encounter (Addendum)
 Cardiac Catheterization scheduled at [facility] for: Thursday March 11, 2024 9 AM Arrival time [facility] Main Entrance A at: 6:30 AM-needs BMP/CBC  Diet: -Nothing to eat after midnight.  Hydration: -May drink clear liquids until 2 hours before the procedure.  Approved liquids: Water, clear tea, black coffee, fruit juices-non-citric and without pulp,Gatorade, plain Jello/popsicles.   Per Reche Finder, NP-  Last GFR 45 so I set him up for IV fluid instead of PO on arrival with 1 ml/kg/hr without bolus due to HFrEF LVEF 35-40%.  Medication instructions: -Hold:  Entresto-day before and day of procedure  Lasix -AM of procedure  Jardiance -AM of procedure -Other usual morning medications can be taken including aspirin  81 mg.  Patient tells me he has not taken Mounjaro  in a week.  Plan to go home the same day, you will only stay overnight if medically necessary.  You must have responsible adult to drive you home.  Someone must be with you the first 24 hours after you arrive home.  Reviewed procedure instructions with patient.

## 2024-03-11 ENCOUNTER — Ambulatory Visit (HOSPITAL_COMMUNITY)
Admission: RE | Disposition: A | Discharge status: Home / Self Care | Payer: Self-pay | Source: Home / Self Care | Attending: Cardiovascular Disease

## 2024-03-11 ENCOUNTER — Other Ambulatory Visit: Payer: Self-pay

## 2024-03-11 ENCOUNTER — Ambulatory Visit (HOSPITAL_COMMUNITY)
Admission: RE | Admit: 2024-03-11 | Discharge: 2024-03-11 | Disposition: A | Discharge status: Home / Self Care | Attending: Cardiovascular Disease | Admitting: Cardiovascular Disease

## 2024-03-11 DIAGNOSIS — Z79899 Other long term (current) drug therapy: Secondary | ICD-10-CM | POA: Insufficient documentation

## 2024-03-11 DIAGNOSIS — Z8673 Personal history of transient ischemic attack (TIA), and cerebral infarction without residual deficits: Secondary | ICD-10-CM | POA: Diagnosis not present

## 2024-03-11 DIAGNOSIS — I700 Atherosclerosis of aorta: Secondary | ICD-10-CM | POA: Insufficient documentation

## 2024-03-11 DIAGNOSIS — J45909 Unspecified asthma, uncomplicated: Secondary | ICD-10-CM | POA: Insufficient documentation

## 2024-03-11 DIAGNOSIS — N189 Chronic kidney disease, unspecified: Secondary | ICD-10-CM | POA: Diagnosis not present

## 2024-03-11 DIAGNOSIS — K219 Gastro-esophageal reflux disease without esophagitis: Secondary | ICD-10-CM | POA: Diagnosis not present

## 2024-03-11 DIAGNOSIS — I130 Hypertensive heart and chronic kidney disease with heart failure and stage 1 through stage 4 chronic kidney disease, or unspecified chronic kidney disease: Secondary | ICD-10-CM | POA: Diagnosis not present

## 2024-03-11 DIAGNOSIS — I493 Ventricular premature depolarization: Secondary | ICD-10-CM | POA: Insufficient documentation

## 2024-03-11 DIAGNOSIS — R931 Abnormal findings on diagnostic imaging of heart and coronary circulation: Secondary | ICD-10-CM | POA: Diagnosis present

## 2024-03-11 DIAGNOSIS — I2584 Coronary atherosclerosis due to calcified coronary lesion: Secondary | ICD-10-CM | POA: Insufficient documentation

## 2024-03-11 DIAGNOSIS — E7849 Other hyperlipidemia: Secondary | ICD-10-CM | POA: Diagnosis not present

## 2024-03-11 DIAGNOSIS — G4733 Obstructive sleep apnea (adult) (pediatric): Secondary | ICD-10-CM | POA: Diagnosis not present

## 2024-03-11 DIAGNOSIS — I5022 Chronic systolic (congestive) heart failure: Secondary | ICD-10-CM | POA: Insufficient documentation

## 2024-03-11 DIAGNOSIS — Z923 Personal history of irradiation: Secondary | ICD-10-CM | POA: Diagnosis not present

## 2024-03-11 DIAGNOSIS — Z8546 Personal history of malignant neoplasm of prostate: Secondary | ICD-10-CM | POA: Insufficient documentation

## 2024-03-11 DIAGNOSIS — Z6837 Body mass index (BMI) 37.0-37.9, adult: Secondary | ICD-10-CM | POA: Insufficient documentation

## 2024-03-11 DIAGNOSIS — E1122 Type 2 diabetes mellitus with diabetic chronic kidney disease: Secondary | ICD-10-CM | POA: Diagnosis not present

## 2024-03-11 DIAGNOSIS — M109 Gout, unspecified: Secondary | ICD-10-CM | POA: Diagnosis not present

## 2024-03-11 DIAGNOSIS — I2510 Atherosclerotic heart disease of native coronary artery without angina pectoris: Secondary | ICD-10-CM | POA: Insufficient documentation

## 2024-03-11 DIAGNOSIS — I2582 Chronic total occlusion of coronary artery: Secondary | ICD-10-CM | POA: Diagnosis not present

## 2024-03-11 HISTORY — PX: CORONARY PRESSURE/FFR WITH 3D MAPPING: CATH118309

## 2024-03-11 HISTORY — PX: LEFT HEART CATH AND CORONARY ANGIOGRAPHY: CATH118249

## 2024-03-11 LAB — BASIC METABOLIC PANEL WITH GFR
Anion gap: 8 (ref 5–15)
BUN: 25 mg/dL — ABNORMAL HIGH (ref 8–23)
CO2: 26 mmol/L (ref 22–32)
Calcium: 8.5 mg/dL — ABNORMAL LOW (ref 8.9–10.3)
Chloride: 106 mmol/L (ref 98–111)
Creatinine, Ser: 1.79 mg/dL — ABNORMAL HIGH (ref 0.61–1.24)
GFR, Estimated: 41 mL/min — ABNORMAL LOW (ref >60)
Glucose, Bld: 232 mg/dL — ABNORMAL HIGH (ref 70–99)
Potassium: 3.9 mmol/L (ref 3.5–5.1)
Sodium: 140 mmol/L (ref 135–145)

## 2024-03-11 LAB — CBC
HCT: 37.2 % — ABNORMAL LOW (ref 39.0–52.0)
Hemoglobin: 11.5 g/dL — ABNORMAL LOW (ref 13.0–17.0)
MCH: 25.7 pg — ABNORMAL LOW (ref 26.0–34.0)
MCHC: 30.9 g/dL (ref 30.0–36.0)
MCV: 83 fL (ref 80.0–100.0)
Platelets: 229 K/uL (ref 150–400)
RBC: 4.48 MIL/uL (ref 4.22–5.81)
RDW: 15.2 % (ref 11.5–15.5)
WBC: 7.2 K/uL (ref 4.0–10.5)
nRBC: 0 % (ref 0.0–0.2)

## 2024-03-11 LAB — GLUCOSE, CAPILLARY: Glucose-Capillary: 211 mg/dL — ABNORMAL HIGH (ref 70–99)

## 2024-03-11 SURGERY — LEFT HEART CATH AND CORONARY ANGIOGRAPHY
Anesthesia: LOCAL

## 2024-03-11 MED ORDER — MIDAZOLAM HCL 2 MG/2ML IJ SOLN
INTRAMUSCULAR | Status: AC
  Filled 2024-03-11: qty 2

## 2024-03-11 MED ORDER — SODIUM CHLORIDE 0.9 % IV SOLN: INTRAVENOUS | Status: DC

## 2024-03-11 MED ORDER — INSULIN ASPART 100 UNIT/ML IJ SOLN
10.0000 [IU] | Freq: Once | INTRAMUSCULAR | Status: DC
Start: 2024-03-11 — End: 2024-03-11

## 2024-03-11 MED ORDER — FENTANYL CITRATE (PF) 100 MCG/2ML IJ SOLN
INTRAMUSCULAR | Status: AC
  Filled 2024-03-11: qty 2

## 2024-03-11 MED ORDER — HEPARIN (PORCINE) IN NACL 1000-0.9 UT/500ML-% IV SOLN
INTRAVENOUS | Status: DC | PRN
  Administered 2024-03-11 (×2): 500 mL

## 2024-03-11 MED ORDER — FENTANYL CITRATE (PF) 100 MCG/2ML IJ SOLN
INTRAMUSCULAR | Status: DC | PRN
  Administered 2024-03-11: 50 ug via INTRAVENOUS

## 2024-03-11 MED ORDER — LIDOCAINE HCL (PF) 1 % IJ SOLN
INTRAMUSCULAR | Status: AC
  Filled 2024-03-11: qty 30

## 2024-03-11 MED ORDER — VERAPAMIL HCL 2.5 MG/ML IV SOLN
INTRAVENOUS | Status: AC
  Filled 2024-03-11: qty 2

## 2024-03-11 MED ORDER — HEPARIN SODIUM (PORCINE) 1000 UNIT/ML IJ SOLN
INTRAMUSCULAR | Status: AC
  Filled 2024-03-11: qty 10

## 2024-03-11 MED ORDER — IOHEXOL 350 MG/ML SOLN
INTRAVENOUS | Status: DC | PRN
  Administered 2024-03-11: 40 mL

## 2024-03-11 MED ORDER — HEPARIN SODIUM (PORCINE) 1000 UNIT/ML IJ SOLN
INTRAMUSCULAR | Status: DC | PRN
  Administered 2024-03-11: 5000 [IU] via INTRAVENOUS

## 2024-03-11 MED ORDER — ASPIRIN 81 MG PO CHEW
81.0000 mg | CHEWABLE_TABLET | ORAL | Status: AC
  Administered 2024-03-11: 81 mg via ORAL
  Filled 2024-03-11: qty 1

## 2024-03-11 MED ORDER — VERAPAMIL HCL 2.5 MG/ML IV SOLN
INTRAVENOUS | Status: DC | PRN
  Administered 2024-03-11: 10 mL via INTRA_ARTERIAL

## 2024-03-11 MED ORDER — HEPARIN SODIUM (PORCINE) 1000 UNIT/ML IJ SOLN
INTRAMUSCULAR | Status: AC
Start: 2024-03-11 — End: 2024-03-11
  Filled 2024-03-11: qty 10

## 2024-03-11 MED ORDER — LIDOCAINE HCL (PF) 1 % IJ SOLN
INTRAMUSCULAR | Status: AC
Start: 2024-03-11 — End: 2024-03-11
  Filled 2024-03-11: qty 30

## 2024-03-11 MED ORDER — LIDOCAINE HCL (PF) 1 % IJ SOLN
INTRAMUSCULAR | Status: DC | PRN
  Administered 2024-03-11: 2 mL via INTRADERMAL

## 2024-03-11 MED ORDER — MIDAZOLAM HCL (PF) 2 MG/2ML IJ SOLN
INTRAMUSCULAR | Status: DC | PRN
  Administered 2024-03-11: 1 mg via INTRAVENOUS

## 2024-03-11 SURGICAL SUPPLY — 9 items
CARD KEY FFR CATHWORX (MISCELLANEOUS) IMPLANT
CATH INFINITI 5 FR JL3.5 (CATHETERS) IMPLANT
CATH INFINITI AMBI 5FR JK (CATHETERS) IMPLANT
CATH INFINITI JR4 5F (CATHETERS) IMPLANT
DEVICE RAD COMP TR BAND LRG (VASCULAR PRODUCTS) IMPLANT
GLIDESHEATH SLEND SS 6F .021 (SHEATH) IMPLANT
GUIDEWIRE INQWIRE 1.5J.035X260 (WIRE) IMPLANT
PACK CARDIAC CATHETERIZATION (CUSTOM PROCEDURE TRAY) ×1 IMPLANT
SET ATX-X65L (MISCELLANEOUS) IMPLANT

## 2024-03-11 NOTE — Interval H&P Note (Signed)
 History and Physical Interval Note:  03/11/2024 11:41 AM  Thomas Gardner  has presented today for surgery, with the diagnosis of abnormal ct.  The various methods of treatment have been discussed with the patient and family. After consideration of risks, benefits and other options for treatment, the patient has consented to  Procedure(s): LEFT HEART CATH AND CORONARY ANGIOGRAPHY (N/A) as a surgical intervention.  The patient's history has been reviewed, patient examined, no change in status, stable for surgery.  I have reviewed the patient's chart and labs.  Questions were answered to the patient's satisfaction.     Jazariah Teall

## 2024-03-12 ENCOUNTER — Ambulatory Visit: Payer: Self-pay

## 2024-03-12 ENCOUNTER — Ambulatory Visit (HOSPITAL_BASED_OUTPATIENT_CLINIC_OR_DEPARTMENT_OTHER): Admitting: Family

## 2024-03-12 ENCOUNTER — Encounter (HOSPITAL_COMMUNITY): Payer: Self-pay | Admitting: Cardiovascular Disease

## 2024-03-12 MED FILL — Heparin Sodium (Porcine) Inj 1000 Unit/ML: INTRAMUSCULAR | Qty: 10 | Status: AC

## 2024-03-12 MED FILL — Verapamil HCl IV Soln 2.5 MG/ML: INTRAVENOUS | Qty: 2 | Status: CN

## 2024-03-12 MED FILL — Heparin Sodium (Porcine) Inj 1000 Unit/ML: INTRAMUSCULAR | Qty: 10 | Status: CN

## 2024-03-12 MED FILL — Verapamil HCl IV Soln 2.5 MG/ML: INTRAVENOUS | Qty: 2 | Status: AC

## 2024-03-12 NOTE — Heart Team MDD (Signed)
   Heart Team Multi-Disciplinary Discussion  Patient: Thomas Gardner  DOB: [birth year]  MRN: [id number]   Date: 03/12/2024  07:00AM    Attendees: Interventional Cardiology: Lurena Red, MD Lonni Hanson, MD Cara Lovelace, MD Alm Clay, MD Dorn Lesches, MD Deatrice Cage, MD    Additional Attendees: Con Clunes, MD   Patient History: 68 y.o. 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   Discussion: The team discussed the likelihood of his exertional dyspnea being multifactorial (cardiomyopathy, renal dysfunction, PVCs). They also discussed concerns for rising creatinine and the significance of his LAD lesion. Recommendations included optimizing heart failure therapy, diurese, get a renal evaluation and assess for renal artery stenosis via renal artery duplex. If his exertional dyspnea persists then consider PCI of the LAD with Shockwave therapy.   Recommendations: Medical Therapy Additional Recommendations/Special Instructions: +/- PCI   Shona Palma, RN  03/12/2024 11:28 AM

## 2024-03-19 ENCOUNTER — Ambulatory Visit (HOSPITAL_BASED_OUTPATIENT_CLINIC_OR_DEPARTMENT_OTHER): Admitting: Family

## 2024-03-22 ENCOUNTER — Other Ambulatory Visit: Payer: Self-pay | Admitting: Internal Medicine

## 2024-03-22 ENCOUNTER — Other Ambulatory Visit: Payer: Self-pay | Admitting: Cardiovascular Disease

## 2024-03-22 ENCOUNTER — Other Ambulatory Visit (HOSPITAL_COMMUNITY): Payer: Self-pay

## 2024-03-22 ENCOUNTER — Other Ambulatory Visit (HOSPITAL_BASED_OUTPATIENT_CLINIC_OR_DEPARTMENT_OTHER): Payer: Self-pay | Admitting: Family

## 2024-03-22 DIAGNOSIS — E7849 Other hyperlipidemia: Secondary | ICD-10-CM

## 2024-03-22 DIAGNOSIS — N1832 Chronic kidney disease, stage 3b: Secondary | ICD-10-CM

## 2024-03-22 DIAGNOSIS — I2510 Atherosclerotic heart disease of native coronary artery without angina pectoris: Secondary | ICD-10-CM

## 2024-03-22 MED ORDER — SPIRONOLACTONE 25 MG PO TABS
25.0000 mg | ORAL_TABLET | Freq: Every day | ORAL | 5 refills | Status: DC
  Filled 2024-03-22: qty 30, 30d supply, fill #0
  Filled 2024-05-04: qty 30, 30d supply, fill #1

## 2024-03-23 ENCOUNTER — Other Ambulatory Visit (HOSPITAL_BASED_OUTPATIENT_CLINIC_OR_DEPARTMENT_OTHER): Payer: Self-pay | Admitting: Family

## 2024-03-23 ENCOUNTER — Other Ambulatory Visit (HOSPITAL_COMMUNITY): Payer: Self-pay

## 2024-03-23 ENCOUNTER — Encounter (HOSPITAL_BASED_OUTPATIENT_CLINIC_OR_DEPARTMENT_OTHER): Payer: Self-pay

## 2024-03-23 ENCOUNTER — Other Ambulatory Visit: Payer: Self-pay

## 2024-03-23 DIAGNOSIS — I10 Essential (primary) hypertension: Secondary | ICD-10-CM

## 2024-03-23 DIAGNOSIS — Z5181 Encounter for therapeutic drug level monitoring: Secondary | ICD-10-CM

## 2024-03-23 MED ORDER — REPATHA SURECLICK 140 MG/ML ~~LOC~~ SOAJ
140.0000 mg | SUBCUTANEOUS | 3 refills | Status: AC
  Filled 2024-03-23: qty 6, 84d supply, fill #0
  Filled 2024-04-23 – 2024-06-18 (×2): qty 6, 84d supply, fill #1

## 2024-03-24 ENCOUNTER — Other Ambulatory Visit (HOSPITAL_COMMUNITY): Payer: Self-pay

## 2024-03-24 MED ORDER — SACUBITRIL-VALSARTAN 49-51 MG PO TABS
1.0000 | ORAL_TABLET | Freq: Two times a day (BID) | ORAL | 3 refills | Status: DC
  Filled 2024-03-24: qty 180, 90d supply, fill #0

## 2024-03-24 NOTE — Telephone Encounter (Signed)
 New start-14 day supply of Entresto was sent in on 10/10. Ok to continue w rf?

## 2024-03-24 NOTE — Addendum Note (Signed)
 Addended by: FREDIRICK BEAU B on: 03/24/2024 11:57 AM   Modules accepted: Orders

## 2024-03-26 ENCOUNTER — Ambulatory Visit
Admission: RE | Admit: 2024-03-26 | Discharge: 2024-03-26 | Disposition: A | Discharge status: Home / Self Care | Source: Ambulatory Visit | Attending: Internal Medicine | Admitting: Internal Medicine

## 2024-03-26 DIAGNOSIS — N1832 Chronic kidney disease, stage 3b: Secondary | ICD-10-CM

## 2024-03-26 NOTE — Addendum Note (Signed)
 Addended by: FREDIRICK BEAU B on: 03/26/2024 04:06 PM   Modules accepted: Orders

## 2024-04-13 ENCOUNTER — Ambulatory Visit (INDEPENDENT_AMBULATORY_CARE_PROVIDER_SITE_OTHER): Admitting: Family

## 2024-04-13 ENCOUNTER — Encounter (HOSPITAL_BASED_OUTPATIENT_CLINIC_OR_DEPARTMENT_OTHER): Payer: Self-pay | Admitting: Family

## 2024-04-13 ENCOUNTER — Other Ambulatory Visit (HOSPITAL_COMMUNITY): Payer: Self-pay

## 2024-04-13 VITALS — BP 148/84 | HR 90 | Ht 66.0 in | Wt 241.0 lb

## 2024-04-13 DIAGNOSIS — G4733 Obstructive sleep apnea (adult) (pediatric): Secondary | ICD-10-CM

## 2024-04-13 DIAGNOSIS — E785 Hyperlipidemia, unspecified: Secondary | ICD-10-CM | POA: Diagnosis not present

## 2024-04-13 DIAGNOSIS — I10 Essential (primary) hypertension: Secondary | ICD-10-CM | POA: Diagnosis not present

## 2024-04-13 DIAGNOSIS — I493 Ventricular premature depolarization: Secondary | ICD-10-CM

## 2024-04-13 DIAGNOSIS — N1832 Chronic kidney disease, stage 3b: Secondary | ICD-10-CM

## 2024-04-13 DIAGNOSIS — Z8673 Personal history of transient ischemic attack (TIA), and cerebral infarction without residual deficits: Secondary | ICD-10-CM

## 2024-04-13 DIAGNOSIS — I5020 Unspecified systolic (congestive) heart failure: Secondary | ICD-10-CM | POA: Diagnosis not present

## 2024-04-13 MED ORDER — OMRON 3 SERIES BP MONITOR DEVI
1.0000 [IU] | Freq: Once | 0 refills | Status: AC
Start: 2024-04-13 — End: 2024-04-17
  Filled 2024-04-13: qty 1, 30d supply, fill #0

## 2024-04-13 MED ORDER — FUROSEMIDE 20 MG PO TABS
20.0000 mg | ORAL_TABLET | Freq: Every day | ORAL | 2 refills | Status: DC
  Filled 2024-04-13: qty 135, 90d supply, fill #0

## 2024-04-13 NOTE — Progress Notes (Signed)
 Cardiology Office Note:  .   Date:  04/13/2024  ID:  Thomas Gardner, DOB 01-Jun-1955, MRN [id number] PCP: Maree Leni Edyth DELENA, MD  Nephrology: Dr. Macel [facility] Providers Cardiologist:  Annabella Scarce, MD Cardiology APP:  Vannie Reche RAMAN, NP    History of Present Illness: .   Thomas Gardner is a 68 y.o. 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   Myoview 2013 LVEF 37%, no ischemia.  Echo 11/2020 LVEF 55-40%, grade 1 diastolic dysfunction.  Admitted 02/2021 for acute ischemic stroke of left thalamic pyramid.  He severe stenosis proximal left external carotid artery and multifocal moderate to severe stenosis of the right posterior cerebral artery.  Treated with aspirin  Plavix  for 3 months then aspirin  alone.  Echo on admission LVEF 50%.  Required diuretics during hospitalization for diuresis.  Losartan  previously increased for BP control.  He also has reduced or self adjusted multiple of his medications to reduce lightheadedness, dizziness.   12/2022 7 day clinic pursued to assess PVC burden. Monitor with 9.6% PVC burden and was recommended to stop Carvedilol , start Metoprolol  tartrate. Echo 01/01/23 due to LE edema 01/01/23 stable LVEF 50% with no significant valvular abnormalities. Itamar home sleep study ordered as did not tolerate CPAP but interested in alternate therapies, but he did not complete.  He was seen 01/13/2024 with bilateral lower extremity swelling for 3 weeks despite taking furosemide  10 mg twice daily.  His BP not at goal losartan  stopped and olmesartan  20 mg nightly initiated. Updated echocardiogram 02/16/2024 LVEF 35-40%, LV global hypokinesis, mild LVH, RV normal, mild LAE, trivial MR.  CCTA 02/2024 with calcium  score of  2810 with greater than 70% stenosis in the proximal RCA and moderate 50 to 60% stenosis in proximal is mid LAD and LCx. Radiology overread small bilateral pleural effusion, bronchial thickening consistent with bronchitis or reactive airway disease, small hiatal hernia, aortic atherosclerosis.  Subsequent LHC 03/11/24 with codominant coronary arteries with two-vessel CAD.  RCA with CTO with faint right to right and left-to-right collaterals.  Heavily calcified stenosis in proximal LAD 70% with distal 80% stenosis.  FFR 0.5 which was significant.  This was discussed with heart team who recommended optimizing heart failure and volume overload and reassess response thought low threshold to proceed with LAD PCI in the near future but it will be a higher risk procedure given degree of calcification which would require intravascular lithotripsy given degree of calcification.  Presents today for follow up. Reviewed LHC results. R radial cath site healed well. Notes bilateral LE swelling worse on days he works longer shifts. He does wear compression stockings with some improvement. 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 Reports exertional dyspnea. Describes feeling winded. This is overall unchanged from previous. No orthopnea, PND. Does get up 2-3x per night to use the restroom. He did get approved for sleep study though is having issue with connecting with his phone, phone number for support provided. He requests to see a pharmacist for medication review. He has established with Dr. Macel of nephrology since last seen has started Spironolactone . He was concerned about taking Furosemide  and Spironolactone  together and has instead been alternating. He has seen increase in weight - discussed safe to take together. He is taking half tablet of Spironolactone  and planning to increase to whole tablet as directed  today. 04/09/24 labs with nephrology creatinine 1.95, GFR 37, K 3.7.   ROS: Please see the history of present  illness.    All other systems reviewed and are negative.   Studies Reviewed: .        Cardiac Studies & Procedures   ______________________________________________________________________________________________ CARDIAC CATHETERIZATION  CARDIAC CATHETERIZATION 03/11/2024  Conclusion   Dist LAD lesion is 80% stenosed.   Prox RCA lesion is 100% stenosed.   Prox LAD to Mid LAD lesion is 70% stenosed.  1.  Codominant coronary arteries with significant two-vessel coronary artery disease.  The right coronary artery is occluded proximally with faint right to right and left to right collaterals.  There is heavily calcified stenosis in the proximal LAD which was significant by virtual FFR (0.50). 2.  Left ventricular angiography was not performed due to chronic kidney disease. 3.  LVEDP was significantly elevated at 31 mmHg after aggressive precath hydration.  Recommendations: The patient's symptoms include heart failure and PVCs but no chest pain. Will discuss at the heart failure team tomorrow to see if there is benefit of the LAD PCI which would likely require intravascular lithotripsy given degree of calcification. The patient has an outpatient visit with nephrology in the near future to address gradual decline in kidney function over the last year.  It might be best to address this before considering coronary revascularization.  Findings Coronary Findings Diagnostic  Dominance: Co-dominant  Left Main The vessel exhibits minimal luminal irregularities.  Left Anterior Descending Prox LAD to Mid LAD lesion is 70% stenosed. The lesion is severely calcified. Pressure gradient was performed on the lesion. Virtual FFR: 0.5. Dist LAD lesion is 80% stenosed.  Left Circumflex  First Obtuse Marginal Branch There is moderate disease in the vessel.  Second Obtuse Marginal Branch There is moderate disease in the vessel.  Third Obtuse Marginal Branch The vessel exhibits minimal luminal  irregularities.  Fourth Obtuse Marginal Branch Vessel is angiographically normal.  Right Coronary Artery Collaterals Mid RCA filled by collaterals from Prox RCA.  Prox RCA lesion is 100% stenosed.  Intervention  No interventions have been documented.   STRESS TESTS  MYOCARDIAL PERFUSION IMAGING 08/15/2011   ECHOCARDIOGRAM  ECHOCARDIOGRAM COMPLETE 02/11/2024  Narrative ECHOCARDIOGRAM REPORT    Patient Name:   Thomas Gardner Date of Exam: 02/11/2024 Medical Rec #:  [id number]       Height:       66.0 in Accession #:    [id number]      Weight:       231.0 lb Date of Birth:  1955/06/29       BSA:          2.126 m Patient Age:    68 years        BP:           160/70 mmHg Patient Gender: M               HR:           72 bpm. Exam Location:  Outpatient  Procedure: 2D Echo, 3D Echo, Color Doppler and Cardiac Doppler (Both Spectral and Color Flow Doppler were utilized during procedure).  Indications:    Dyspnea  History:        Patient has prior history of Echocardiogram examinations, most recent 01/01/2023. Stroke; Risk Factors:Diabetes, Hypertension, Dyslipidemia and Non-Smoker. HFrEF.  Sonographer:    Orvil Holmes RDCS Referring Phys: [id number] Maverick Patman S Lana Flaim  IMPRESSIONS   1. Left ventricular ejection fraction, by estimation,  is 35 to 40%. The left ventricle has moderately decreased function. The left ventricle demonstrates global hypokinesis. There is mild left ventricular hypertrophy. Left ventricular diastolic parameters are indeterminate. 2. Right ventricular systolic function is normal. The right ventricular size is normal. Tricuspid regurgitation signal is inadequate for assessing PA pressure. 3. Left atrial size was mildly dilated. 4. The mitral valve is grossly normal. Trivial mitral valve regurgitation. No evidence of mitral stenosis. 5. The aortic valve is tricuspid. There is mild calcification of the aortic valve. Aortic valve regurgitation is not  visualized. No aortic stenosis is present. 6. The inferior vena cava is normal in size with greater than 50% respiratory variability, suggesting right atrial pressure of 3 mmHg.  Comparison(s): Prior images reviewed side by side. Changes from prior study are noted. LVEF appears reduced.  FINDINGS Left Ventricle: Left ventricular ejection fraction, by estimation, is 35 to 40%. The left ventricle has moderately decreased function. The left ventricle demonstrates global hypokinesis. 3D ejection fraction reviewed and evaluated as part of the interpretation. Alternate measurement of EF is felt to be most reflective of LV function. The left ventricular internal cavity size was normal in size. There is mild left ventricular hypertrophy. Left ventricular diastolic parameters are indeterminate.  Right Ventricle: The right ventricular size is normal. No increase in right ventricular wall thickness. Right ventricular systolic function is normal. Tricuspid regurgitation signal is inadequate for assessing PA pressure.  Left Atrium: Left atrial size was mildly dilated.  Right Atrium: Right atrial size was normal in size.  Pericardium: Trivial pericardial effusion is present.  Mitral Valve: The mitral valve is grossly normal. Trivial mitral valve regurgitation. No evidence of mitral valve stenosis.  Tricuspid Valve: The tricuspid valve is normal in structure. Tricuspid valve regurgitation is trivial. No evidence of tricuspid stenosis.  Aortic Valve: The aortic valve is tricuspid. There is mild calcification of the aortic valve. Aortic valve regurgitation is not visualized. No aortic stenosis is present.  Pulmonic Valve: The pulmonic valve was normal in structure. Pulmonic valve regurgitation is trivial. No evidence of pulmonic stenosis.  Aorta: The aortic root is normal in size and structure. Ascending aorta measurements are within normal limits for age when indexed to body surface area.  Venous: The  inferior vena cava is normal in size with greater than 50% respiratory variability, suggesting right atrial pressure of 3 mmHg.  IAS/Shunts: No atrial level shunt detected by color flow Doppler.  Additional Comments: 3D was performed not requiring image post processing on an independent workstation and was abnormal.   LEFT VENTRICLE PLAX 2D LVIDd:         5.23 cm      Diastology LVIDs:         4.65 cm      LV e' medial:    10.90 cm/s LV PW:         1.09 cm      LV E/e' medial:  9.6 LV IVS:        0.86 cm      LV e' lateral:   15.90 cm/s LVOT diam:     2.10 cm      LV E/e' lateral: 6.6 LV SV:         34 LV SV Index:   16 LVOT Area:     3.46 cm  3D Volume EF: LV Volumes (MOD)            3D EF:        44 % LV vol d,  MOD A2C: 215.0 ml LV EDV:       222 ml LV vol d, MOD A4C: 183.0 ml LV ESV:       124 ml LV vol s, MOD A2C: 73.7 ml  LV SV:        98 ml LV vol s, MOD A4C: 93.1 ml LV SV MOD A2C:     141.3 ml LV SV MOD A4C:     183.0 ml LV SV MOD BP:      118.6 ml  RIGHT VENTRICLE RV Basal diam:  2.57 cm     PULMONARY VEINS RV Mid diam:    2.10 cm     A Reversal Velocity: 55.10 cm/s RV S prime:     12.20 cm/s  Diastolic Velocity:  40.80 cm/s TAPSE (M-mode): 2.0 cm      S/D Velocity:        1.20 Systolic Velocity:   49.40 cm/s  LEFT ATRIUM             Index        RIGHT ATRIUM           Index LA diam:        4.60 cm 2.16 cm/m   RA Area:     16.80 cm LA Vol (A2C):   84.4 ml 39.70 ml/m  RA Volume:   50.10 ml  23.56 ml/m LA Vol (A4C):   74.6 ml 35.09 ml/m LA Biplane Vol: 81.6 ml 38.38 ml/m AORTIC VALVE LVOT Vmax:   57.80 cm/s LVOT Vmean:  36.600 cm/s LVOT VTI:    0.098 m  AORTA Ao Root diam: 2.90 cm Ao Asc diam:  3.70 cm  MITRAL VALVE MV Area (PHT): 6.27 cm     SHUNTS MV Decel Time: 121 msec     Systemic VTI:  0.10 m MV E velocity: 105.00 cm/s  Systemic Diam: 2.10 cm MV A velocity: 54.90 cm/s MV E/A ratio:  1.91  Soyla Merck MD Electronically signed by Soyla Merck MD Signature Date/Time: 02/15/2024/12:17:10 PM    Final    MONITORS  LONG TERM MONITOR (3-14 DAYS) 12/30/2022  Narrative 7 Day Zio Monitor  Quality: Fair.  Baseline artifact. Predominant rhythm: sinus rhythm Average heart rate: 85 bpm Max heart rate: 128 bpm Min heart rate: 59 bpm Pauses >2.5 seconds: none  Rare PACs (<1%) Frequent PVCs (9.6%) Rare ventricular couplets and triplets (<1%) Up to 13 beats NSVT Up to 6 beats SVT   Tiffany C. Raford, MD, [facility] 02/09/2023 8:40 AM   CT SCANS  CT CORONARY MORPH W/CTA COR W/SCORE 03/04/2024  Addendum 03/12/2024  9:02 PM ADDENDUM REPORT: 03/12/2024 21:00  EXAM: OVER-READ INTERPRETATION  CT CHEST  The following report is an over-read performed by radiologist Dr. Andrea Gasman of [facility] on 03/12/2024. This over-read does not include interpretation of cardiac or coronary anatomy or pathology. The coronary CTA interpretation by the cardiologist is attached.  COMPARISON:  Chest CT 09/19/2023  FINDINGS: Vascular: Aortic atherosclerosis. The included aorta is normal in caliber.  Mediastinum/nodes: No adenopathy or mass.  Minimal hiatal hernia.  Lungs: No focal airspace disease. No pulmonary nodule. Small bilateral pleural effusions. Bronchial thickening is most prominent in the lower lobes.  Upper abdomen: No acute or unexpected findings.  Musculoskeletal: There are no acute or suspicious osseous abnormalities. Thoracic spondylosis with anterior spurring. Right gynecomastia.  IMPRESSION: 1. Small bilateral pleural effusions. 2. Bronchial thickening is most prominent in the lower lobes, can be seen with bronchitis or  reactive airways disease. 3. Small hiatal hernia.  Aortic Atherosclerosis (ICD10-I70.0).   Electronically Signed By: Andrea Gasman M.D. On: 03/12/2024 21:00  Narrative CLINICAL DATA:  42M with HFrEF, hypertension, hyperlipidemia diabetes, obesity, and  stroke.  EXAM: Cardiac/Coronary  CT  TECHNIQUE: The patient was scanned on a GE Apex scanner.  PROTOCOL: A non-contrast, gated CT scan was obtained with axial slices of 2.5 mm through the heart for calcium  scoring. Calcium  scoring was performed using the Agatston method. A 120 kV prospective, gated, contrast cardiac CT scan was obtained. Gantry rotation speed was 230 msec and collimation was 0.63 mm. Two sublingual nitroglycerin  tablets (0.8 mg) were given. The 3D data set was reconstructed with motion correction for the best systolic or diastolic phase. Images were analyzed on a dedicated workstation using MPR, MIP, and VRT modes. The patient received 95 cc of contrast.  FINDINGS: Aorta: Normal size.  Aortic atherosclerosis.  No dissection.  Aortic Valve:  Trileaflet.  No calcifications.  Coronary Arteries:  Normal coronary origin.  Right dominance.  RCA is a large dominant artery that gives rise to PDA and PLVB. There is severe (>70%) mixed plaque in the proximal .  Left main is a large artery that gives rise to LAD and LCX arteries. There is minimal (< 25%) mixed plaque.  LAD is a large vessel that has moderate (50-69%) mixed plaque in the proximal and mid vessel. There is severe (>70%) calcified plaque distally. D1 is a small vessel that is heavily calcified.  LCX is a non-dominant artery with moderate (50-69%) mixed plaque proximally and mild to moderate plaque in the mid and distal LCX. The LCX gives rise to OM1 and OM2 which both have >70% mixed plaque.  Coronary Calcium  Score:  Left main: 138  Left anterior descending artery: 1,873  Left circumflex artery: 427  Right coronary artery: 371  Total: 2,810  Percentile: 99th  Other findings:  Normal pulmonary vein drainage into the left atrium.  Normal let atrial appendage without a thrombus.  Normal size of the pulmonary artery.  Non-cardiac: See separate report from [facility]  Radiology.  IMPRESSION: 1. Coronary calcium  score of 2,810. This was 99th percentile for age-, sex, and race-matched controls.  2. There is severe (>70%) stenosis in the proximal RCA and the distal LAD. There is moderate (50-69%) stenosis in the proximal/mid LAD and LCX. CAD-RADS 4.  3. Normal coronary origin with right dominance.  4.  Will send for aortic atherosclerosis.  5.  Will send for FFRct.  RECOMMENDATIONS: CAD-RADS 4: Severe stenosis. (70-99% or > 50% left main). Cardiac catheterization or CT FFR is recommended. Consider symptom-guided anti-ischemic pharmacotherapy as well as risk factor modification per guideline directed care. Invasive coronary angiography recommended with revascularization per published guideline statements.  Annabella Scarce, MD  Electronically Signed: By: Annabella Scarce M.D. On: 03/05/2024 15:21     ______________________________________________________________________________________________            Risk Assessment/Calculations:        STOP-Bang Score:  7      Physical Exam:   VS:  BP (!) 148/84 (BP Location: Left Arm, Patient Position: Sitting, Cuff Size: Normal)   Pulse 90   Ht 5' 6 (1.676 m)   Wt 241 lb (109.3 kg)   SpO2 97%   BMI 38.90 kg/m    Wt Readings from Last 3 Encounters:  04/13/24 241 lb (109.3 kg)  03/11/24 224 lb (101.6 kg)  02/27/24 234 lb (106.1 kg)    GEN: Well  nourished, overweight, well developed in no acute distress NECK: No JVD; No carotid bruits CARDIAC: RRR, no murmurs, rubs, gallops; radial pulses 2+ bilaterally; PT pulses 2+ bilaterally RESPIRATORY:  Clear to auscultation without rales, wheezing or rhonchi  ABDOMEN: Soft, non-tender, non-distended EXTREMITIES:  Trace nonpitting bilateral LE edema; No deformity. R radial cath site with no ecchymosis nor hematoma.  ASSESSMENT AND PLAN: .    PVC - Monitor 12/2021 with PVC burden 9.6%. Symptomatic with PVC. Continue metoprolol  succinate 50 mg once  daily in the evening  CAD - LHC 03/11/24 with codominant coronary arteries with two-vessel CAD.  RCA with CTO with faint right to right and left-to-right collaterals.  Heavily calcified stenosis in proximal LAD 70% with distal 80% stenosis.  FFR 0.5 which was significant.  Discussed with heart team who recommended optimizing heart failure and volume overload and reassess response though low threshold to proceed with LAD PCI in the near future but higher risk procedure given degree of calcification which would require intravascular lithotripsy. Changes to heart failure management as below. follow up with Dr. Raford or myself in a couple months to reassess response and consider PCI. GDMT aspirin  81 mg daily, metoprolol  succinate 50 mg nightly, Repatha  140 mg every 14 days. Recommend aiming for 150 minutes of moderate intensity activity per week and following a heart healthy diet.    HFrEF - echocardiogram 12/2023 with LVEF 35 to 40% mild LVH mildly dilated left atria. Newly reduced LVEF. NYHA II. Predominantly symptomatic with LE edema and exertional dyspnea.  Adjust Furosemide  to 20mg  daily in the morning with additional 20mg  PRN for weight gain of 2 lbs overnight or 5 lbs in one week.  Continue Jardiance  25 mg daily, Metoprolol  Succinate 50mg  at bedtime, Entresto  49-51mg  BID.  Increase Spironolactone  from 12.5mg  to 25mg  daily. Nephrology had recommended 25mg  dose, he was concerned and wished to gradually start so has only been taking 12.5mg  daily Low sodium diet, fluid restriction <2L, and daily weights encouraged. Educated to contact our office for weight gain of 2 lbs overnight or 5 lbs in one week.  If no significant improvement in LE edema, dyspnea with GDMT for HF plan for PCI of LAD, as above.  CKD IIIB- Follows with Dr. Macel. Appreciate addition of Spironolactone .   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 12/2023 LDL 119.  On Repatha  140mg  SQ every  2 weeks.  Did not tolerate statin with myalgia.  CMP/FLP in 2 weeks  HTN, goal <130/80 - Not at goal despite multiple medication changes.  Rx sent to pharmacy for BP cuff to monitor at home and if not covered by insurance he will purchase OTC.  Continue metoprolol  succinate 50 mg daily, Entresto  49-51 mg twice daily, increase spironolactone  to 25 mg daily as advised by nephrology.  Adjust Lasix  to 20 mg daily, as above. CMP in 2 weeks to reassess renal function and potassium after medication change. Discussed to monitor BP at home at least 2 hours after medications and sitting for 5-10 minutes.  Plan for renal artery duplex to rule out renal stenosis as contributory to hypertension. Sleep study pending, as below.  OSA - Previously did not tolerate CPAP many years ago.  Interested in trying newer CPAP vs inspire device. Has Itamar home sleep study and PIN, issues with activity to phone.  He was provided Itamar support line and will reach out.  Hx of CVA - continue aspirin  81mg  daily, Repatha  140mg  q14 days.  Dispo: follow up in January with PharmD and in February with Dr. Raford or Reche GORMAN Finder, NP   Signed, Reche GORMAN Finder, NP

## 2024-04-13 NOTE — Patient Instructions (Addendum)
 Medication Instructions:   CHANGE Spironolactone  to a whole 25mg  tablet  CHANGE Furosemide  to 20mg  (one tablet) in the morning. You may take an extra tablet for weight gain of 2 lbs overnight or 5 lbs in one week.  *If you need a refill on your cardiac medications before your next appointment, please call your pharmacy*   Lab Work: Your physician recommends that you return for lab work in 2 weeks: fasting lipid panel, CMET  If you have labs (blood work) drawn today and your tests are completely normal, you will receive your results only by: MyChart Message (if you have MyChart) OR A paper copy in the mail If you have any lab test that is abnormal or we need to change your treatment, we will call you to review the results.  Testing/Procedures: Your physician has requested that you have a renal artery duplex. During this test, an ultrasound is used to evaluate blood flow to the kidneys. Allow one hour for this exam. Do not eat after midnight the day before and avoid carbonated beverages. Take your medications as you usually do.   Follow-Up: At [facility], you and your health needs are our priority.  As part of our continuing mission to provide you with exceptional heart care, our providers are all part of one team.  This team includes your primary Cardiologist (physician) and Advanced Practice Providers or APPs (Physician Assistants and Nurse Practitioners) who all work together to provide you with the care you need, when you need it.  Your next appointment:   Follow up as scheduled with Kristin, PharmD in January AND With Dr. Raford or Reche GORMAN Finder, NP in February    Other Instructions  Recommend calling  [phone] to talk with [facility] about getting your home sleep study to work.   For coronary artery disease often called heart disease we aim for optimal guideline directed medical therapy. We use the A, B, Cs to help keep us  on track!  A = Aspirin   81mg  daily B = Beta blocker which helps to relax the heart. This is your Metoprolol . C = Cholesterol control. You take Repatha  to help control your cholesterol.   The five pillars of heart failure are below. These are the 5 medicines to help strengthen the heart muscle function.  Beta blocker - your Metoprolol  ARNI - your Entresto  Loop diuretic - your Furosemide  MRA - your Spironolactone  SGLT2i - your Jardiance   Recommend weighing daily and keeping a log. Please call our office if you have weight gain of 2 pounds overnight or 5 pounds in 1 week.   Date  Time Weight

## 2024-04-14 ENCOUNTER — Other Ambulatory Visit (HOSPITAL_COMMUNITY): Payer: Self-pay

## 2024-04-23 ENCOUNTER — Other Ambulatory Visit (HOSPITAL_COMMUNITY): Payer: Self-pay

## 2024-04-27 ENCOUNTER — Other Ambulatory Visit (HOSPITAL_COMMUNITY): Payer: Self-pay

## 2024-04-27 MED ORDER — VITAMIN D3 25 MCG PO TABS
1000.0000 [IU] | ORAL_TABLET | Freq: Every day | ORAL | 0 refills | Status: AC
  Filled 2024-04-27: qty 100, 100d supply, fill #0

## 2024-04-27 MED ORDER — ASCORBIC ACID 500 MG PO TABS
500.0000 mg | ORAL_TABLET | Freq: Every day | ORAL | 0 refills | Status: AC
  Filled 2024-04-27: qty 100, 100d supply, fill #0

## 2024-04-30 ENCOUNTER — Other Ambulatory Visit (HOSPITAL_COMMUNITY): Payer: Self-pay

## 2024-04-30 MED ORDER — INSULIN GLARGINE 100 UNIT/ML SOLOSTAR PEN
PEN_INJECTOR | SUBCUTANEOUS | 3 refills | Status: DC
  Filled 2024-04-30: qty 12, 33d supply, fill #0

## 2024-05-04 ENCOUNTER — Other Ambulatory Visit (HOSPITAL_COMMUNITY): Payer: Self-pay

## 2024-05-05 ENCOUNTER — Other Ambulatory Visit (HOSPITAL_COMMUNITY): Payer: Self-pay

## 2024-05-05 MED ORDER — ALLOPURINOL 100 MG PO TABS
100.0000 mg | ORAL_TABLET | Freq: Every day | ORAL | 2 refills | Status: AC
  Filled 2024-05-05: qty 90, 90d supply, fill #0

## 2024-05-12 ENCOUNTER — Other Ambulatory Visit (HOSPITAL_COMMUNITY): Payer: Self-pay

## 2024-05-13 ENCOUNTER — Other Ambulatory Visit: Payer: Self-pay

## 2024-05-13 ENCOUNTER — Inpatient Hospital Stay (HOSPITAL_COMMUNITY)
Admission: EM | Admit: 2024-05-13 | Discharge: 2024-05-21 | DRG: 286 | Disposition: A | Discharge status: Home Health | Attending: Internal Medicine | Admitting: Internal Medicine

## 2024-05-13 ENCOUNTER — Emergency Department (HOSPITAL_COMMUNITY)

## 2024-05-13 ENCOUNTER — Encounter (HOSPITAL_COMMUNITY): Payer: Self-pay | Admitting: Emergency Medicine

## 2024-05-13 ENCOUNTER — Inpatient Hospital Stay (HOSPITAL_COMMUNITY)

## 2024-05-13 DIAGNOSIS — M109 Gout, unspecified: Secondary | ICD-10-CM | POA: Diagnosis present

## 2024-05-13 DIAGNOSIS — I130 Hypertensive heart and chronic kidney disease with heart failure and stage 1 through stage 4 chronic kidney disease, or unspecified chronic kidney disease: Principal | ICD-10-CM | POA: Diagnosis present

## 2024-05-13 DIAGNOSIS — Z794 Long term (current) use of insulin: Secondary | ICD-10-CM | POA: Diagnosis not present

## 2024-05-13 DIAGNOSIS — Z7984 Long term (current) use of oral hypoglycemic drugs: Secondary | ICD-10-CM

## 2024-05-13 DIAGNOSIS — I2510 Atherosclerotic heart disease of native coronary artery without angina pectoris: Secondary | ICD-10-CM | POA: Diagnosis present

## 2024-05-13 DIAGNOSIS — I10 Essential (primary) hypertension: Secondary | ICD-10-CM | POA: Diagnosis present

## 2024-05-13 DIAGNOSIS — R7989 Other specified abnormal findings of blood chemistry: Secondary | ICD-10-CM | POA: Diagnosis not present

## 2024-05-13 DIAGNOSIS — Z833 Family history of diabetes mellitus: Secondary | ICD-10-CM

## 2024-05-13 DIAGNOSIS — J189 Pneumonia, unspecified organism: Secondary | ICD-10-CM | POA: Diagnosis present

## 2024-05-13 DIAGNOSIS — N1832 Chronic kidney disease, stage 3b: Secondary | ICD-10-CM | POA: Diagnosis present

## 2024-05-13 DIAGNOSIS — G4733 Obstructive sleep apnea (adult) (pediatric): Secondary | ICD-10-CM | POA: Diagnosis present

## 2024-05-13 DIAGNOSIS — Z7985 Long-term (current) use of injectable non-insulin antidiabetic drugs: Secondary | ICD-10-CM

## 2024-05-13 DIAGNOSIS — I509 Heart failure, unspecified: Secondary | ICD-10-CM | POA: Diagnosis not present

## 2024-05-13 DIAGNOSIS — I471 Supraventricular tachycardia, unspecified: Secondary | ICD-10-CM | POA: Diagnosis not present

## 2024-05-13 DIAGNOSIS — Z823 Family history of stroke: Secondary | ICD-10-CM

## 2024-05-13 DIAGNOSIS — I25810 Atherosclerosis of coronary artery bypass graft(s) without angina pectoris: Secondary | ICD-10-CM | POA: Diagnosis present

## 2024-05-13 DIAGNOSIS — J45909 Unspecified asthma, uncomplicated: Secondary | ICD-10-CM | POA: Diagnosis present

## 2024-05-13 DIAGNOSIS — Z800 Family history of malignant neoplasm of digestive organs: Secondary | ICD-10-CM

## 2024-05-13 DIAGNOSIS — I493 Ventricular premature depolarization: Secondary | ICD-10-CM | POA: Diagnosis present

## 2024-05-13 DIAGNOSIS — Z79899 Other long term (current) drug therapy: Secondary | ICD-10-CM | POA: Diagnosis not present

## 2024-05-13 DIAGNOSIS — R0609 Other forms of dyspnea: Secondary | ICD-10-CM | POA: Diagnosis not present

## 2024-05-13 DIAGNOSIS — Z8249 Family history of ischemic heart disease and other diseases of the circulatory system: Secondary | ICD-10-CM | POA: Diagnosis not present

## 2024-05-13 DIAGNOSIS — E1122 Type 2 diabetes mellitus with diabetic chronic kidney disease: Secondary | ICD-10-CM | POA: Diagnosis present

## 2024-05-13 DIAGNOSIS — D631 Anemia in chronic kidney disease: Secondary | ICD-10-CM | POA: Diagnosis present

## 2024-05-13 DIAGNOSIS — I5A Non-ischemic myocardial injury (non-traumatic): Secondary | ICD-10-CM | POA: Diagnosis not present

## 2024-05-13 DIAGNOSIS — N179 Acute kidney failure, unspecified: Secondary | ICD-10-CM | POA: Diagnosis present

## 2024-05-13 DIAGNOSIS — E1165 Type 2 diabetes mellitus with hyperglycemia: Secondary | ICD-10-CM | POA: Diagnosis present

## 2024-05-13 DIAGNOSIS — I5023 Acute on chronic systolic (congestive) heart failure: Secondary | ICD-10-CM | POA: Diagnosis present

## 2024-05-13 DIAGNOSIS — Z7982 Long term (current) use of aspirin: Secondary | ICD-10-CM

## 2024-05-13 DIAGNOSIS — E785 Hyperlipidemia, unspecified: Secondary | ICD-10-CM | POA: Diagnosis present

## 2024-05-13 DIAGNOSIS — R0602 Shortness of breath: Secondary | ICD-10-CM | POA: Diagnosis present

## 2024-05-13 DIAGNOSIS — K219 Gastro-esophageal reflux disease without esophagitis: Secondary | ICD-10-CM | POA: Diagnosis present

## 2024-05-13 DIAGNOSIS — I255 Ischemic cardiomyopathy: Secondary | ICD-10-CM | POA: Diagnosis present

## 2024-05-13 DIAGNOSIS — J9601 Acute respiratory failure with hypoxia: Principal | ICD-10-CM

## 2024-05-13 DIAGNOSIS — D509 Iron deficiency anemia, unspecified: Secondary | ICD-10-CM | POA: Diagnosis present

## 2024-05-13 DIAGNOSIS — Z8673 Personal history of transient ischemic attack (TIA), and cerebral infarction without residual deficits: Secondary | ICD-10-CM

## 2024-05-13 DIAGNOSIS — G4730 Sleep apnea, unspecified: Secondary | ICD-10-CM | POA: Diagnosis present

## 2024-05-13 DIAGNOSIS — E7849 Other hyperlipidemia: Secondary | ICD-10-CM | POA: Diagnosis present

## 2024-05-13 DIAGNOSIS — Z1152 Encounter for screening for COVID-19: Secondary | ICD-10-CM

## 2024-05-13 DIAGNOSIS — M7989 Other specified soft tissue disorders: Secondary | ICD-10-CM | POA: Diagnosis not present

## 2024-05-13 DIAGNOSIS — Z8546 Personal history of malignant neoplasm of prostate: Secondary | ICD-10-CM

## 2024-05-13 DIAGNOSIS — R54 Age-related physical debility: Secondary | ICD-10-CM | POA: Diagnosis present

## 2024-05-13 DIAGNOSIS — Z923 Personal history of irradiation: Secondary | ICD-10-CM

## 2024-05-13 LAB — CBC WITH DIFFERENTIAL/PLATELET
Abs Immature Granulocytes: 0.08 K/uL — ABNORMAL HIGH (ref 0.00–0.07)
Basophils Absolute: 0 K/uL (ref 0.0–0.1)
Basophils Relative: 0 %
Eosinophils Absolute: 0 K/uL (ref 0.0–0.5)
Eosinophils Relative: 0 %
HCT: 40 % (ref 39.0–52.0)
Hemoglobin: 12.5 g/dL — ABNORMAL LOW (ref 13.0–17.0)
Immature Granulocytes: 1 %
Lymphocytes Relative: 9 %
Lymphs Abs: 1 K/uL (ref 0.7–4.0)
MCH: 26.3 pg (ref 26.0–34.0)
MCHC: 31.3 g/dL (ref 30.0–36.0)
MCV: 84 fL (ref 80.0–100.0)
Monocytes Absolute: 1 K/uL (ref 0.1–1.0)
Monocytes Relative: 9 %
Neutro Abs: 9 K/uL — ABNORMAL HIGH (ref 1.7–7.7)
Neutrophils Relative %: 81 %
Platelets: 206 K/uL (ref 150–400)
RBC: 4.76 MIL/uL (ref 4.22–5.81)
RDW: 15.7 % — ABNORMAL HIGH (ref 11.5–15.5)
WBC: 11 K/uL — ABNORMAL HIGH (ref 4.0–10.5)
nRBC: 0 % (ref 0.0–0.2)

## 2024-05-13 LAB — RESP PANEL BY RT-PCR (RSV, FLU A&B, COVID)  RVPGX2
Influenza A by PCR: NEGATIVE
Influenza B by PCR: NEGATIVE
Resp Syncytial Virus by PCR: NEGATIVE
SARS Coronavirus 2 by RT PCR: NEGATIVE

## 2024-05-13 LAB — HEPATIC FUNCTION PANEL
ALT: 23 U/L (ref 0–44)
AST: 93 U/L — ABNORMAL HIGH (ref 15–41)
Albumin: 3.2 g/dL — ABNORMAL LOW (ref 3.5–5.0)
Alkaline Phosphatase: 104 U/L (ref 38–126)
Bilirubin, Direct: 0.1 mg/dL (ref 0.0–0.2)
Indirect Bilirubin: 0.2 mg/dL — ABNORMAL LOW (ref 0.3–0.9)
Total Bilirubin: 0.4 mg/dL (ref 0.0–1.2)
Total Protein: 6.4 g/dL — ABNORMAL LOW (ref 6.5–8.1)

## 2024-05-13 LAB — LIPID PANEL
Cholesterol: 216 mg/dL — ABNORMAL HIGH (ref 0–200)
HDL: 46 mg/dL
LDL Cholesterol: 161 mg/dL — ABNORMAL HIGH (ref 0–99)
Total CHOL/HDL Ratio: 4.7 ratio
Triglycerides: 45 mg/dL
VLDL: 9 mg/dL (ref 0–40)

## 2024-05-13 LAB — CBC
HCT: 36.8 % — ABNORMAL LOW (ref 39.0–52.0)
Hemoglobin: 11.7 g/dL — ABNORMAL LOW (ref 13.0–17.0)
MCH: 26.2 pg (ref 26.0–34.0)
MCHC: 31.8 g/dL (ref 30.0–36.0)
MCV: 82.3 fL (ref 80.0–100.0)
Platelets: 198 K/uL (ref 150–400)
RBC: 4.47 MIL/uL (ref 4.22–5.81)
RDW: 15.5 % (ref 11.5–15.5)
WBC: 11.8 K/uL — ABNORMAL HIGH (ref 4.0–10.5)
nRBC: 0 % (ref 0.0–0.2)

## 2024-05-13 LAB — BASIC METABOLIC PANEL WITH GFR
Anion gap: 12 (ref 5–15)
Anion gap: 12 (ref 5–15)
BUN: 27 mg/dL — ABNORMAL HIGH (ref 8–23)
BUN: 27 mg/dL — ABNORMAL HIGH (ref 8–23)
CO2: 23 mmol/L (ref 22–32)
CO2: 23 mmol/L (ref 22–32)
Calcium: 8 mg/dL — ABNORMAL LOW (ref 8.9–10.3)
Calcium: 8.4 mg/dL — ABNORMAL LOW (ref 8.9–10.3)
Chloride: 107 mmol/L (ref 98–111)
Chloride: 107 mmol/L (ref 98–111)
Creatinine, Ser: 2.06 mg/dL — ABNORMAL HIGH (ref 0.61–1.24)
Creatinine, Ser: 2.04 mg/dL — ABNORMAL HIGH (ref 0.61–1.24)
GFR, Estimated: 34 mL/min — ABNORMAL LOW
GFR, Estimated: 35 mL/min — ABNORMAL LOW
Glucose, Bld: 200 mg/dL — ABNORMAL HIGH (ref 70–99)
Glucose, Bld: 110 mg/dL — ABNORMAL HIGH (ref 70–99)
Potassium: 3.7 mmol/L (ref 3.5–5.1)
Potassium: 3.8 mmol/L (ref 3.5–5.1)
Sodium: 141 mmol/L (ref 135–145)
Sodium: 142 mmol/L (ref 135–145)

## 2024-05-13 LAB — CBG MONITORING, ED
Glucose-Capillary: 93 mg/dL (ref 70–99)
Glucose-Capillary: 90 mg/dL (ref 70–99)

## 2024-05-13 LAB — ECHOCARDIOGRAM COMPLETE
Area-P 1/2: 6.48 cm2
Calc EF: 26 %
Est EF: <20
Height: 66 in
S' Lateral: 5.1 cm
Single Plane A2C EF: 26.6 %
Single Plane A4C EF: 29.8 %
Weight: 3816.6 [oz_av]

## 2024-05-13 LAB — HEPARIN LEVEL (UNFRACTIONATED): Heparin Unfractionated: 0.2 [IU]/mL — ABNORMAL LOW (ref 0.30–0.70)

## 2024-05-13 LAB — TROPONIN T, HIGH SENSITIVITY
Troponin T High Sensitivity: 145 ng/L (ref 0–19)
Troponin T High Sensitivity: 482 ng/L (ref 0–19)

## 2024-05-13 LAB — GLUCOSE, CAPILLARY
Glucose-Capillary: 83 mg/dL (ref 70–99)
Glucose-Capillary: 111 mg/dL — ABNORMAL HIGH (ref 70–99)

## 2024-05-13 LAB — IRON AND TIBC
Iron: 20 ug/dL — ABNORMAL LOW (ref 45–182)
Saturation Ratios: 10 % — ABNORMAL LOW (ref 17.9–39.5)
TIBC: 200 ug/dL — ABNORMAL LOW (ref 250–450)
UIBC: 180 ug/dL

## 2024-05-13 LAB — PROCALCITONIN: Procalcitonin: 0.6 ng/mL

## 2024-05-13 LAB — MAGNESIUM: Magnesium: 2.1 mg/dL (ref 1.7–2.4)

## 2024-05-13 LAB — PRO BRAIN NATRIURETIC PEPTIDE: Pro Brain Natriuretic Peptide: 8752 pg/mL — ABNORMAL HIGH

## 2024-05-13 LAB — HEMOGLOBIN A1C
Hgb A1c MFr Bld: 6.8 % — ABNORMAL HIGH (ref 4.8–5.6)
Mean Plasma Glucose: 148.46 mg/dL

## 2024-05-13 LAB — FERRITIN: Ferritin: 740 ng/mL — ABNORMAL HIGH (ref 24–336)

## 2024-05-13 LAB — HIV ANTIBODY (ROUTINE TESTING W REFLEX): HIV Screen 4th Generation wRfx: NONREACTIVE

## 2024-05-13 LAB — TSH: TSH: 0.769 u[IU]/mL (ref 0.350–4.500)

## 2024-05-13 MED ORDER — SODIUM CHLORIDE 0.9 % IV SOLN
200.0000 mg | INTRAVENOUS | Status: AC
  Administered 2024-05-14 – 2024-05-17 (×4): 200 mg via INTRAVENOUS
  Filled 2024-05-13 (×4): qty 10

## 2024-05-13 MED ORDER — TAMSULOSIN HCL 0.4 MG PO CAPS
0.4000 mg | ORAL_CAPSULE | Freq: Every day | ORAL | Status: DC
  Administered 2024-05-13 – 2024-05-21 (×4): 0.4 mg via ORAL
  Filled 2024-05-13 (×3): qty 1

## 2024-05-13 MED ORDER — SPIRONOLACTONE 25 MG PO TABS
25.0000 mg | ORAL_TABLET | Freq: Every day | ORAL | Status: DC
  Administered 2024-05-13 – 2024-05-16 (×4): 25 mg via ORAL
  Filled 2024-05-13 (×4): qty 1

## 2024-05-13 MED ORDER — SODIUM CHLORIDE 0.9 % IV SOLN
2.0000 g | Freq: Once | INTRAVENOUS | Status: AC
  Administered 2024-05-13: 2 g via INTRAVENOUS
  Filled 2024-05-13: qty 20

## 2024-05-13 MED ORDER — PERFLUTREN LIPID MICROSPHERE
1.0000 mL | INTRAVENOUS | Status: AC | PRN
  Administered 2024-05-13: 3 mL via INTRAVENOUS

## 2024-05-13 MED ORDER — METOPROLOL SUCCINATE ER 50 MG PO TB24
50.0000 mg | ORAL_TABLET | Freq: Every day | ORAL | Status: DC
  Administered 2024-05-13: 50 mg via ORAL
  Filled 2024-05-13: qty 1

## 2024-05-13 MED ORDER — INSULIN ASPART 100 UNIT/ML IJ SOLN
0.0000 [IU] | Freq: Three times a day (TID) | INTRAMUSCULAR | Status: DC
  Administered 2024-05-16 (×2): 1 [IU] via SUBCUTANEOUS
  Administered 2024-05-16 – 2024-05-17 (×2): 2 [IU] via SUBCUTANEOUS
  Administered 2024-05-18 (×2): 1 [IU] via SUBCUTANEOUS
  Administered 2024-05-18: 2 [IU] via SUBCUTANEOUS
  Administered 2024-05-19: 1 [IU] via SUBCUTANEOUS
  Administered 2024-05-19: 2 [IU] via SUBCUTANEOUS
  Administered 2024-05-19: 3 [IU] via SUBCUTANEOUS
  Administered 2024-05-20: 2 [IU] via SUBCUTANEOUS
  Administered 2024-05-20: 1 [IU] via SUBCUTANEOUS
  Administered 2024-05-20: 3 [IU] via SUBCUTANEOUS
  Administered 2024-05-21: 2 [IU] via SUBCUTANEOUS
  Administered 2024-05-21: 1 [IU] via SUBCUTANEOUS
  Filled 2024-05-13: qty 2
  Filled 2024-05-13 (×2): qty 1

## 2024-05-13 MED ORDER — ASPIRIN 81 MG PO TBEC
81.0000 mg | DELAYED_RELEASE_TABLET | Freq: Every day | ORAL | Status: DC
  Administered 2024-05-13 – 2024-05-21 (×9): 81 mg via ORAL
  Filled 2024-05-13 (×4): qty 1

## 2024-05-13 MED ORDER — HEPARIN BOLUS VIA INFUSION
4000.0000 [IU] | Freq: Once | INTRAVENOUS | Status: AC
  Administered 2024-05-13: 4000 [IU] via INTRAVENOUS
  Filled 2024-05-13: qty 4000

## 2024-05-13 MED ORDER — SODIUM CHLORIDE 0.9 % IV SOLN: 500.0000 mg | INTRAVENOUS | Status: DC

## 2024-05-13 MED ORDER — SODIUM CHLORIDE 0.9 % IV SOLN: 1.0000 g | INTRAVENOUS | Status: DC

## 2024-05-13 MED ORDER — ROSUVASTATIN CALCIUM 20 MG PO TABS: 20.0000 mg | ORAL_TABLET | Freq: Every day | ORAL | Status: DC

## 2024-05-13 MED ORDER — HEPARIN BOLUS VIA INFUSION
1300.0000 [IU] | Freq: Once | INTRAVENOUS | Status: AC
  Administered 2024-05-13: 1300 [IU] via INTRAVENOUS
  Filled 2024-05-13: qty 1300

## 2024-05-13 MED ORDER — FUROSEMIDE 10 MG/ML IJ SOLN
40.0000 mg | Freq: Once | INTRAMUSCULAR | Status: AC
  Administered 2024-05-13: 40 mg via INTRAVENOUS
  Filled 2024-05-13: qty 4

## 2024-05-13 MED ORDER — ALLOPURINOL 100 MG PO TABS
100.0000 mg | ORAL_TABLET | Freq: Every day | ORAL | Status: DC
  Administered 2024-05-13 – 2024-05-21 (×9): 100 mg via ORAL
  Filled 2024-05-13 (×4): qty 1

## 2024-05-13 MED ORDER — SODIUM CHLORIDE 0.9 % IV SOLN
500.0000 mg | Freq: Once | INTRAVENOUS | Status: AC
  Administered 2024-05-13: 500 mg via INTRAVENOUS
  Filled 2024-05-13: qty 5

## 2024-05-13 MED ORDER — HEPARIN (PORCINE) 25000 UT/250ML-% IV SOLN
1200.0000 [IU]/h | INTRAVENOUS | Status: DC
  Administered 2024-05-13: 1450 [IU]/h via INTRAVENOUS
  Administered 2024-05-13: 1300 [IU]/h via INTRAVENOUS
  Administered 2024-05-14: 1450 [IU]/h via INTRAVENOUS
  Administered 2024-05-15 – 2024-05-16 (×2): 1350 [IU]/h via INTRAVENOUS
  Administered 2024-05-17: 1200 [IU]/h via INTRAVENOUS
  Filled 2024-05-13 (×6): qty 250

## 2024-05-13 MED ORDER — FUROSEMIDE 10 MG/ML IJ SOLN
40.0000 mg | Freq: Two times a day (BID) | INTRAMUSCULAR | Status: DC
  Administered 2024-05-13 – 2024-05-14 (×3): 40 mg via INTRAVENOUS
  Filled 2024-05-13 (×3): qty 4

## 2024-05-13 MED ORDER — ACETAMINOPHEN 325 MG PO TABS
650.0000 mg | ORAL_TABLET | Freq: Four times a day (QID) | ORAL | Status: DC | PRN
  Administered 2024-05-13 – 2024-05-15 (×2): 650 mg via ORAL
  Filled 2024-05-13 (×2): qty 2

## 2024-05-13 MED ORDER — ACETAMINOPHEN 650 MG RE SUPP: 650.0000 mg | Freq: Four times a day (QID) | RECTAL | Status: DC | PRN

## 2024-05-13 MED ORDER — IRON SUCROSE 200 MG IVPB - SIMPLE MED
200.0000 mg | Status: DC
  Filled 2024-05-13: qty 110

## 2024-05-13 MED ORDER — INSULIN GLARGINE 100 UNIT/ML ~~LOC~~ SOLN
18.0000 [IU] | Freq: Two times a day (BID) | SUBCUTANEOUS | Status: DC
  Administered 2024-05-13 – 2024-05-14 (×3): 18 [IU] via SUBCUTANEOUS
  Filled 2024-05-13 (×5): qty 0.18

## 2024-05-13 MED ORDER — EMPAGLIFLOZIN 10 MG PO TABS
10.0000 mg | ORAL_TABLET | Freq: Every day | ORAL | Status: DC
  Administered 2024-05-14 – 2024-05-21 (×8): 10 mg via ORAL
  Filled 2024-05-13 (×3): qty 1

## 2024-05-13 NOTE — Progress Notes (Signed)
 Echocardiogram 2D Echocardiogram has been performed.  Thomas Gardner 05/13/2024, 4:08 PM

## 2024-05-13 NOTE — ED Notes (Signed)
 Okay per Dr. Theadore to trial pt off bipap. 2L Lehr applied. Breath sounds clear bilaterally.

## 2024-05-13 NOTE — Progress Notes (Addendum)
 " PROGRESS NOTE    Thomas Gardner  FMW:[id number] DOB: 1956/05/20 DOA: 05/13/2024 PCP: Maree Leni Edyth DELENA, MD  Subjective: No acute events since admission. Seen and examined at bedside. Reports feeling slightly better with improvement in shortness of breath. Reports still having significant bilateral LE edema. Denies nausea, vomiting, constipation.  Hospital Course:  As per H&P: 68 y.o. male with history of chronic HFrEF last EF measured on September 2025 was 35 to 40%, has had cardiac cath in October 2025 which showedDist LAD lesion is 80% stenosed,Prox RCA lesion is 100% stenosed, Prox LAD to Mid LAD lesion is 70% stenose, diabetes mellitus type 2, chronic disease stage III, gout, sleep apnea, history of prostate cancer, history of stroke presents to the ER because of increasing shortness of breath with exertion over the last 2 to 3 days.  Patient states he also has been having some orthopnea and has noticed some weight gain and lower extremity edema more than usual.  Over the last one week patient has been having productive cough denies any fever or chills.  He has been taking Lasix  despite which he was getting more short of breath and decided to come to the ER.  He also has been having chest tightness on exertion last few days.    Assessment and Plan:  Acute on chronic HFrEF  - last EF measured in September 2025 was 35 to 40%.   - Patient takes Lasix  20 mg at home.  - cont lasix  40 mg IV every 12 - Continue metoprolol , jardiance  and spironolactone . - hold home Entresto  given AKI - TTE pending - monitor I/Os, daily weights, renal function, electrolytes - monitor on tele - cardiology following  Elevated troponin NSTEMI  - concern for non-ST elevation MI given the recent cardiac cath showing severe disease that was not stented with plan to manage medically (Dist LAD lesion is 80% stenosed, prox RCA lesion is 100% stenosed, prox LAD to Mid LAD lesion is 70% stenoses) - as per patient,  history of myalgias with statins - home repatha  on hold as non-formulary - cont heparin  infusion - continue with aspirin  and metoprolol  - monitor on tele - TTE pending - cardiology following  Acute on chronic kidney disease stage III  - creatinine 2.04 < 2.06, baseline around 1.7 - Possible cardiorenal syndrome.  - IV diuresis as elsewhere.  - hold home entresto  - monitor I/Os, renal function   Presumed pneumonia  - patient presented with productive cough  - CXR showed bilateral pleural effusion with associated atelectasis vs PNA - procalcitonin not elevated - stop empiric antibiotics  - monitor clinically  Diabetes mellitus type 2  - HGbA1c 6.8 - takes Lantus .  - hold home Mounjaro    - cont Jardiance   - continue home dose of Lantus  with sliding scale coverage.   Anemia  - no signs/symptoms of acute bleed - Follow CBC  - will check iron  studies   History of gout on allopurinol .  Prior history of stroke on aspirin  and Repatha .  Sleep apnea CPAP at bedtime.  DVT prophylaxis:   IV Heparin    Code Status: Full Code  Disposition Plan: TBD pending clinical course Reason for continuing need for hospitalization: IV diuresis, severity of illness, TTE pending, cardiology recommendations  Objective: Vitals:   05/13/24 0900 05/13/24 0915 05/13/24 1056 05/13/24 1215  BP: (!) 152/103 (!) 142/78  (!) 155/99  Pulse: 94 96  98  Resp: (!) 32 20  (!) 26  Temp:   98.8 F (  37.1 C)   TempSrc:   Oral   SpO2: 100% 100%  100%  Weight:      Height:        Intake/Output Summary (Last 24 hours) at 05/13/2024 1247 Last data filed at 05/13/2024 0800 Gross per 24 hour  Intake 342.58 ml  Output 300 ml  Net 42.58 ml   Filed Weights   05/13/24 0020  Weight: 109.3 kg    Examination:  Physical Exam Vitals and nursing note reviewed.  Constitutional:      General: He is not in acute distress.    Appearance: He is ill-appearing.  HENT:     Head: Normocephalic and atraumatic.   Cardiovascular:     Rate and Rhythm: Normal rate and regular rhythm.     Pulses: Normal pulses.     Heart sounds: Normal heart sounds.  Pulmonary:     Effort: Pulmonary effort is normal.     Breath sounds: Normal breath sounds.  Abdominal:     General: Bowel sounds are normal.     Palpations: Abdomen is soft.  Musculoskeletal:     Right lower leg: Edema present.     Left lower leg: Edema present.  Neurological:     Mental Status: He is alert. Mental status is at baseline.     Data Reviewed: I have personally reviewed following labs and imaging studies  CBC: Recent Labs  Lab 05/13/24 0051 05/13/24 0600  WBC 11.8* 11.0*  NEUTROABS  --  9.0*  HGB 11.7* 12.5*  HCT 36.8* 40.0  MCV 82.3 84.0  PLT 198 206   Basic Metabolic Panel: Recent Labs  Lab 05/13/24 0051 05/13/24 0600  NA 141 142  K 3.7 3.8  CL 107 107  CO2 23 23  GLUCOSE 200* 110*  BUN 27* 27*  CREATININE 2.06* 2.04*  CALCIUM  8.0* 8.4*  MG  --  2.1   GFR: Estimated Creatinine Clearance: 40.2 mL/min (A) (by C-G formula based on SCr of 2.04 mg/dL (H)). Liver Function Tests: Recent Labs  Lab 05/13/24 0600  AST 93*  ALT 23  ALKPHOS 104  BILITOT 0.4  PROT 6.4*  ALBUMIN 3.2*   No results for input(s): LIPASE, AMYLASE in the last 168 hours. No results for input(s): AMMONIA in the last 168 hours. Coagulation Profile: No results for input(s): INR, PROTIME in the last 168 hours. Cardiac Enzymes: No results for input(s): CKTOTAL, CKMB, CKMBINDEX, TROPONINI in the last 168 hours. ProBNP, BNP (last 5 results) Recent Labs    01/13/24 1208 01/30/24 1340 05/13/24 0051  PROBNP  --   --  8,752.0*  BNP 170.3* 119.0*  --    HbA1C: Recent Labs    05/13/24 0600  HGBA1C 6.8*   CBG: Recent Labs  Lab 05/13/24 0729 05/13/24 1200  GLUCAP 93 90   Lipid Profile: Recent Labs    05/13/24 0600  CHOL 216*  HDL 46  LDLCALC 161*  TRIG 45  CHOLHDL 4.7   Thyroid Function Tests: Recent  Labs    05/13/24 0600  TSH 0.769   Anemia Panel: No results for input(s): VITAMINB12, FOLATE, FERRITIN, TIBC, IRON , RETICCTPCT in the last 72 hours. Sepsis Labs: Recent Labs  Lab 05/13/24 0600  PROCALCITON 0.60    Recent Results (from the past 240 hours)  Resp panel by RT-PCR (RSV, Flu A&B, Covid) Anterior Nasal Swab     Status: None   Collection Time: 05/13/24  3:33 AM   Specimen: Anterior Nasal Swab  Result Value Ref Range Status  SARS Coronavirus 2 by RT PCR NEGATIVE NEGATIVE Final   Influenza A by PCR NEGATIVE NEGATIVE Final   Influenza B by PCR NEGATIVE NEGATIVE Final    Comment: (NOTE) The Xpert Xpress SARS-CoV-2/FLU/RSV plus assay is intended as an aid in the diagnosis of influenza from Nasopharyngeal swab specimens and should not be used as a sole basis for treatment. Nasal washings and aspirates are unacceptable for Xpert Xpress SARS-CoV-2/FLU/RSV testing.  Fact Sheet for Patients: bloggercourse.com  Fact Sheet for Healthcare Providers: seriousbroker.it  This test is not yet approved or cleared by the [place]  FDA and has been authorized for detection and/or diagnosis of SARS-CoV-2 by FDA under an Emergency Use Authorization (EUA). This EUA will remain in effect (meaning this test can be used) for the duration of the COVID-19 declaration under Section 564(b)(1) of the Act, 21 U.S.C. section 360bbb-3(b)(1), unless the authorization is terminated or revoked.     Resp Syncytial Virus by PCR NEGATIVE NEGATIVE Final    Comment: (NOTE) Fact Sheet for Patients: bloggercourse.com  Fact Sheet for Healthcare Providers: seriousbroker.it  This test is not yet approved or cleared by the [place]  FDA and has been authorized for detection and/or diagnosis of SARS-CoV-2 by FDA under an Emergency Use Authorization (EUA). This EUA will remain in effect  (meaning this test can be used) for the duration of the COVID-19 declaration under Section 564(b)(1) of the Act, 21 U.S.C. section 360bbb-3(b)(1), unless the authorization is terminated or revoked.  Performed at [facility], [address], [place], [place] [address]      Radiology Studies: DG Chest Port 1 View Result Date: 05/13/2024 EXAM: 1 VIEW(S) XRAY OF THE CHEST 05/13/2024 12:52:48 AM COMPARISON: None available. CLINICAL HISTORY: sob FINDINGS: LUNGS AND PLEURA: Low lung volumes. Layering bilateral pleural effusions. Bibasilar airspace opacities. No pneumothorax. HEART AND MEDIASTINUM: Cardiomegaly. BONES AND SOFT TISSUES: No acute osseous abnormality. IMPRESSION: 1. Layering bilateral pleural effusions with bibasilar atelectasis or pneumonia. Electronically signed by: Norman Gatlin MD 05/13/2024 01:02 AM EST RP Workstation: [id number]    Scheduled Meds:  allopurinol   100 mg Oral Daily   aspirin  EC  81 mg Oral Daily   empagliflozin   10 mg Oral Daily   furosemide   40 mg Intravenous BID   insulin  aspart  0-9 Units Subcutaneous TID WC   insulin  glargine  18 Units Subcutaneous BID   metoprolol  succinate  50 mg Oral QHS   spironolactone   25 mg Oral Daily   tamsulosin   0.4 mg Oral Daily   Continuous Infusions:  azithromycin      cefTRIAXone  (ROCEPHIN )  IV     heparin  1,300 Units/hr (05/13/24 0448)     LOS: 0 days   Norval Bar, MD  [facility]  05/13/2024, 12:47 PM   "

## 2024-05-13 NOTE — ED Provider Notes (Signed)
 " [facility]ER[facility]Y DEPT [facility] Emergency Department Provider Note MRN:  [id number]  Arrival date & time: 05/13/2024     Chief Complaint   Shortness of Breath   History of Present Illness   Thomas Gardner is a 68 y.o. year-old male with a history of asthma, CHF presenting to the ED with chief complaint of shortness of breath.  Feeling sick with cold or flulike illness for the past day or so.  Worsening shortness of breath today.  Also some increased lower extremity edema.  Hypoxic with EMS.  Review of Systems  A thorough review of systems was obtained and all systems are negative except as noted in the HPI and PMH.   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   GERD (gastroesophageal reflux disease)    History of hiatal hernia    Hyperlipidemia    Hypertension    OSA (obstructive sleep apnea)    did not tolerate cpap, uses pillow for elevation   Prostate cancer [facility])     Past Surgical History:  Procedure Laterality Date   BREATH TEK H PYLORI  11/11/2011   Procedure: BREATH TEK H PYLORI;  Surgeon: Alm VEAR Angle, MD;  Location: [facility];  Service: General;  Laterality: N/A;   CARDIAC CATHETERIZATION  2011   per pt done by Dr Ladona at [facility],  told no bloackages   colonscopy  2018   CORONARY PRESSURE/FFR WITH 3D MAPPING N/A 03/11/2024   Procedure: Coronary Pressure/FFR w/3D Mapping;  Surgeon: Darron Deatrice LABOR, MD;  Location: [facility]  INVASIVE CV LAB;  Service: Cardiovascular;  Laterality: N/A;   ESOPHAGOGASTRODUODENOSCOPY (EGD) WITH PROPOFOL  N/A 05/04/2014   Procedure: ESOPHAGOGASTRODUODENOSCOPY (EGD) WITH PROPOFOL ;  Surgeon: Lamar Donnald GAILS, MD;  Location: [facility] ENDOSCOPY;  Service: Endoscopy;  Laterality: N/A;   GOLD SEED IMPLANT N/A 04/16/2022   Procedure: GOLD SEED IMPLANT;  Surgeon: Elisabeth Valli BIRCH, MD;  Location: [facility];  Service: Urology;  Laterality: N/A;   LEFT HEART CATH AND CORONARY ANGIOGRAPHY N/A 03/11/2024   Procedure: LEFT HEART CATH AND CORONARY ANGIOGRAPHY;  Surgeon: Darron Deatrice LABOR, MD;  Location: [facility]VAS[facility];  Service: Cardiovascular;  Laterality: N/A;    Family History  Problem Relation Age of Onset   Heart failure Mother    Cancer Mother        ovarian   Diabetes Mother    Stroke Father    Heart failure Sister    Heart failure Brother    Colon cancer Brother    Cancer Maternal Aunt        breast   Diabetes Maternal Aunt    Cancer Maternal Uncle        colon    Social History   Socioeconomic History   Marital status: Single    Spouse name: Not on file   Number of children: 0  Years of education: Not on file   Highest education level: Not on file  Occupational History   Occupation: Beautician  Tobacco Use   Smoking status: Never    Passive exposure: Never   Smokeless tobacco: Never  Vaping Use   Vaping status: Never Used  Substance and Sexual Activity   Alcohol use: No    Alcohol/week: 0.0 standard drinks of alcohol   Drug use: No   Sexual activity: Not on file  Other Topics Concern   Not on file  Social History Narrative   Not on file   Social Drivers of Health   Tobacco Use: Low Risk (05/13/2024)   Patient History    Smoking Tobacco Use: Never    Smokeless Tobacco Use: Never    Passive Exposure: Never  Financial Resource Strain: Not on file  Food Insecurity: Not on file  Transportation Needs: Not on file  Physical Activity: Not on file   Stress: Not on file  Social Connections: Not on file  Intimate Partner Violence: Not on file  Depression (PHQ2-9): Low Risk (04/19/2021)   Depression (PHQ2-9)    PHQ-2 Score: 0  Alcohol Screen: Not on file  Housing: Not on file  Utilities: Not on file  Health Literacy: Not on file     Physical Exam   Vitals:   05/13/24 0018 05/13/24 0245  BP: (!) 154/110 111/71  Pulse: (!) 121 (!) 101  Resp: (!) 37 20  Temp: 99 F (37.2 C)   SpO2: 96% 100%    CONSTITUTIONAL: Well-appearing, moderate respiratory distress NEURO/PSYCH:  Alert and oriented x 3, no focal deficits EYES:  eyes equal and reactive ENT/NECK:  no LAD, no JVD CARDIO: Tachycardic rate, well-perfused, normal S1 and S2 PULM: Scattered wheezes and rhonchi GI/GU:  non-distended, non-tender MSK/SPINE:  No gross deformities, no edema SKIN:  no rash, atraumatic   *Additional and/or pertinent findings included in MDM below  Diagnostic and Interventional Summary    EKG Interpretation Date/Time:  Thursday May 13 2024 00:21:12 EST Ventricular Rate:  120 PR Interval:  146 QRS Duration:  99 QT Interval:  336 QTC Calculation: 475 R Axis:   -29  Text Interpretation: Sinus tachycardia Abnormal R-wave progression, early transition LVH with secondary repolarization abnormality Confirmed by Thomas Sharper [phone]) on 05/13/2024 3:44:26 AM       Labs Reviewed  CBC - Abnormal; Notable for the following components:      Result Value   WBC 11.8 (*)    Hemoglobin 11.7 (*)    HCT 36.8 (*)    All other components within normal limits  BASIC METABOLIC PANEL WITH GFR - Abnormal; Notable for the following components:   Glucose, Bld 200 (*)    BUN 27 (*)    Creatinine, Ser 2.06 (*)    Calcium  8.0 (*)    GFR, Estimated 34 (*)    All other components within normal limits  PRO BRAIN NATRIURETIC PEPTIDE - Abnormal; Notable for the following components:   Pro Brain Natriuretic Peptide 8,752.0 (*)    All other components  within normal limits  TROPONIN T, HIGH SENSITIVITY - Abnormal; Notable for the following components:   Troponin T High Sensitivity 145 (*)    All other components within normal limits  RESP PANEL BY RT-PCR (RSV, FLU A&B, COVID)  [id number]  TROPONIN T, HIGH SENSITIVITY    DG Chest Port 1 View  Final Result      Medications  cefTRIAXone  (ROCEPHIN ) 2 g in sodium chloride  0.9 %  100 mL IVPB (0 g Intravenous Stopped 05/13/24 0254)  azithromycin  (ZITHROMAX ) 500 mg in sodium chloride  0.9 % 250 mL IVPB (500 mg Intravenous New Bag/Given 05/13/24 0225)  furosemide  (LASIX ) injection 40 mg (40 mg Intravenous Given 05/13/24 0241)     Procedures  /  Critical Care .Critical Care  Performed by: Thomas Ozell HERO, MD Authorized by: Thomas Ozell HERO, MD   Critical care provider statement:    Critical care time (minutes):  45   Critical care was necessary to treat or prevent imminent or life-threatening deterioration of the following conditions:  Respiratory failure   Critical care was time spent personally by me on the following activities:  Development of treatment plan with patient or surrogate, discussions with consultants, evaluation of patient's response to treatment, examination of patient, ordering and review of laboratory studies, ordering and review of radiographic studies, ordering and performing treatments and interventions, pulse oximetry, re-evaluation of patient's condition and review of old charts   ED Course and Medical Decision Making  Initial Impression and Ddx Question asthma exacerbation versus CHF exacerbation versus flu with hypoxia.  Less likely PE.  Patient seems to be improving with positive pressure ventilation.  Past medical/surgical history that increases complexity of ED encounter: CHF  Interpretation of Diagnostics I personally reviewed the EKG and my interpretation is as follows: Sinus rhythm, possible new ST depressions versus LVH strain pattern  Labs reveal elevated  troponin up to 145, BNP elevation.  Patient Reassessment and Ultimate Disposition/Management     Patient continues to improve, looks like he will not need the BiPAP for much longer.  Accepted for admission by hospitalist team.  Troponin elevation is favored to be type II in the setting of CHF, will trend troponins.  Patient not having any chest pain.  Patient management required discussion with the following services or consulting groups:  Hospitalist Service  Complexity of Problems Addressed Acute illness or injury that poses threat of life of bodily function  Additional Data Reviewed and Analyzed Further history obtained from: Prior labs/imaging results  Additional Factors Impacting ED Encounter Risk Consideration of hospitalization  Ozell Gardner. Theadore, MD [facility] Emergency Medicine [facility] [email or web address]  Final Clinical Impressions(s) / ED Diagnoses     ICD-10-CM   1. Acute respiratory failure with hypoxia (HCC)  J96.01     2. Acute on chronic congestive heart failure, unspecified heart failure type (HCC)  I50.9       ED Discharge Orders     None        Discharge Instructions Discussed with and Provided to Patient:   Discharge Instructions   None      Thomas Ozell HERO, MD 05/13/24 [phone]  "

## 2024-05-13 NOTE — Progress Notes (Signed)
 PHARMACY - ANTICOAGULATION CONSULT NOTE  Pharmacy Consult for Heparin  Indication: chest pain/ACS  Allergies[1]  Patient Measurements: Height: 5' 6 (167.6 cm) Weight: 108.2 kg (238 lb 8.6 oz) IBW/kg (Calculated) : 63.8 HEPARIN  DW (KG): 88.3  Vital Signs: Temp: 99.4 F (37.4 C) (12/25 1300) Temp Source: Oral (12/25 1300) BP: 134/76 (12/25 1300) Pulse Rate: 99 (12/25 1300)  Labs: Recent Labs    05/13/24 0051 05/13/24 0600 05/13/24 1311  HGB 11.7* 12.5*  --   HCT 36.8* 40.0  --   PLT 198 206  --   HEPARINUNFRC  --   --  0.20*  CREATININE 2.06* 2.04*  --     Estimated Creatinine Clearance: 40 mL/min (A) (by C-G formula based on SCr of 2.04 mg/dL (H)).   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 male presenting with CHF and elevated troponin. He reports worsening dyspnea on exertion, PND, orthopnea, and leg swelling but no chest pain. Pharmacy is being consulted to dose heparin  for ACS rule-out. Patient is not on anticoagulation prior to admission.  Heparin  level 0.20 is subtherapeutic with heparin  running at 1300 units/hr. Hgb (12.5) and PLTs (206) are stable. Patient renal  function is stable. Per RN, no report of pauses, issues with the line, or signs of bleeding.   Goal of Therapy:  Heparin  level 0.3-0.7 units/ml Monitor platelets by anticoagulation protocol: Yes   Plan:  -Heparin  bolus 1300 units IV x1 (~14.7 units/kg), then -Increase heparin  to 1450 units/hr (~1.7 units/kg increase) -Recheck heparin  level after 8 hours to confirm therapeutic -Monitor daily heparin  levels and CBC -Monitor for any signs/symptoms of bleeding  Thank you for allowing pharmacy to be involved with this patient's care.  Mendel Barter, PharmD PGY1 Clinical Pharmacist [facility]  05/13/2024 2:04 PM    [1]  Allergies Allergen Reactions   Erythromycin Nausea And Vomiting   Nsaids Other (See Comments)    Contraindication due to CKD    Onion Nausea And Vomiting  [2]  Current Facility-Administered Medications on File Prior to Encounter  Medication Dose Route Frequency Provider Last Rate Last Admin   sodium phosphate  (FLEET) 7-19 GM/118ML enema 1 enema  1 enema Rectal Once Cam Morene ORN, MD       Current Outpatient Medications on File Prior to Encounter  Medication Sig Dispense Refill   acetaminophen  (TYLENOL ) 325 MG tablet Take 1-2 tablets (325-650 mg total) by mouth every 4 (four) hours as needed for mild pain.     albuterol  (VENTOLIN  HFA) 108 (90 Base) MCG/ACT inhaler Inhale 1-2 puffs into the  lungs every 6 (six) hours as needed. (Patient taking differently: Inhale 1-2 puffs into the lungs every 6 (six) hours as needed for wheezing or shortness of breath.) 9 g 3   allopurinol  (ZYLOPRIM ) 100 MG tablet Take 1 tablet (100 mg total) by mouth daily. (Patient taking differently: Take 50 mg by mouth 2 (two) times daily.) 90 tablet 2   ascorbic acid  (VITAMIN C ) 500 MG tablet Take 1 tablet (500 mg total) by mouth daily. 100 tablet 0   aspirin  EC 81 MG tablet Take 1 tablet (81 mg total) by mouth daily. 90 tablet 0   empagliflozin  (JARDIANCE ) 25 MG TABS tablet  Take 1 tablet (25 mg total) by mouth in the morning. (Patient taking differently: Take 12.5 mg by mouth in the morning and at bedtime.) 90 tablet 3   Evolocumab  (REPATHA  SURECLICK) 140 MG/ML SOAJ Inject 140 mg into the skin every 14 (fourteen) days. Please keep your upcoming appointment for refills. 6 mL 3   furosemide  (LASIX ) 20 MG tablet Take 1 tablet (20 mg total) by mouth daily. May take an additional tablet as needed for weight gain of 2 lbs overnight or 5 lbs in one week. 135 tablet 2   insulin  glargine (LANTUS  SOLOSTAR) 100 UNIT/ML Solostar Pen Inject 16 Units into the skin every morning AND 20 Units at bedtime. 12 mL 3   metoprolol  succinate (TOPROL -XL) 50 MG 24 hr tablet Take 1 tablet (50 mg total) by mouth at bedtime. Take with or immediately following a meal. 90 tablet 2   sacubitril -valsartan  (ENTRESTO ) 49-51 MG Take 1 tablet by mouth 2 (two) times daily. 180 tablet 3   spironolactone  (ALDACTONE ) 25 MG tablet Take 1 tablet (25 mg total) by mouth daily. (Patient taking differently: Take 12.5 mg by mouth 2 (two) times daily.) 30 tablet 5   tirzepatide  (MOUNJARO ) 2.5 MG/0.5ML Pen Inject 2.5 mg into the skin once a week. 6 mL 3   vitamin D3 (CHOLECALCIFEROL ) 25 MCG tablet Take 1 tablet (1,000 Units total) by mouth daily. 100 tablet 0   Accu-Chek Softclix Lancets lancets Use to test Blood Sugars daily. 100 each 3   Continuous Glucose Sensor (FREESTYLE LIBRE 3 SENSOR) MISC Use as directed for blood sugar monitoring at least three times daily and as needed (Patient not taking: Reported on 05/13/2024) 6 each 3   glucose blood (ACCU-CHEK GUIDE TEST) test strip Use to test Blood Sugars once daily 300 strip 3   tamsulosin  (FLOMAX ) 0.4 MG CAPS capsule Take 1 capsule (0.4 mg total) by mouth daily. (Patient not taking: Reported on 05/13/2024) 30 capsule 11   [DISCONTINUED] Accu-Chek Softclix Lancets lancets Use to test blood sugar levels daily. 100 each 3   [DISCONTINUED] Blood Glucose Monitoring Suppl  (ACCU-CHEK GUIDE) w/Device KIT use as directed 1 kit 0   [DISCONTINUED] Blood Glucose Monitoring Suppl (ACCU-CHEK GUIDE) w/Device KIT Use to monitor Blood Sugars daily. 1 kit 0   [DISCONTINUED] Continuous Glucose Sensor (FREESTYLE LIBRE 3 SENSOR) MISC Use as directed for blood sugar monitoring at least three times daily and as needed 6 each 3   [DISCONTINUED] glucose blood (ACCU-CHEK GUIDE) test strip Use 1 to test blood sugar levels everyday. 300 each 3

## 2024-05-13 NOTE — Progress Notes (Signed)
 PHARMACY - ANTICOAGULATION CONSULT NOTE  Pharmacy Consult for Heparin  Indication: chest pain/ACS  Allergies[1]  Patient Measurements: Height: 5' 6 (167.6 cm) Weight: 109.3 kg (240 lb 15.4 oz) IBW/kg (Calculated) : 63.8 HEPARIN  DW (KG): 88.6  Vital Signs: Temp: 98.9 F (37.2 C) (12/25 0409) Temp Source: Oral (12/25 0409) BP: 124/76 (12/25 0400) Pulse Rate: 100 (12/25 0400)  Labs: Recent Labs    05/13/24 0051  HGB 11.7*  HCT 36.8*  PLT 198  CREATININE 2.06*    Estimated Creatinine Clearance: 39.8 mL/min (A) (by C-G formula based on SCr of 2.06 mg/dL (H)).   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 male with CHF and elevated troponin for heparin  Goal of Therapy:  Heparin  level 0.3-0.7 units/ml Monitor platelets by anticoagulation protocol: Yes   Plan:  Heparin  4000 units IV bolus, then start heparin  1300 units/hr Check heparin  level in 8 hours.   Kamariah Fruchter, Cordella Misty 05/13/2024,4:34 AM      [1]  Allergies Allergen Reactions   Erythromycin Nausea And Vomiting  [2]  Current Facility-Administered Medications on File Prior to Encounter  Medication Dose Route  Frequency Provider Last Rate Last Admin   sodium phosphate  (FLEET) 7-19 GM/118ML enema 1 enema  1 enema Rectal Once Herrick, Benjamin W, MD       Current Outpatient Medications on File Prior to Encounter  Medication Sig Dispense Refill   Accu-Chek Softclix Lancets lancets Use to test blood sugar levels daily. 100 each 3   Accu-Chek Softclix Lancets lancets Use to test Blood Sugars daily. 100 each 3   acetaminophen  (TYLENOL ) 325 MG tablet Take 1-2 tablets (325-650 mg total) by mouth every 4 (four) hours as needed for mild pain.     albuterol  (VENTOLIN  HFA) 108 (90 Base) MCG/ACT inhaler Inhale 1-2 puffs into the lungs every 6 (six) hours as needed. 9 g 3   allopurinol  (ZYLOPRIM ) 100 MG tablet Take 1 tablet (100 mg total) by mouth daily. 90 tablet 2   ascorbic acid  (VITAMIN C ) 500 MG tablet Take 1 tablet (500 mg total) by mouth daily. 100 tablet 0   aspirin  EC 81 MG tablet Take 1 tablet (81 mg total) by mouth daily. 90 tablet 0   Blood Glucose Monitoring Suppl (ACCU-CHEK GUIDE) w/Device KIT use as directed 1 kit 0   Blood Glucose Monitoring Suppl (ACCU-CHEK GUIDE) w/Device KIT Use to monitor Blood Sugars daily. 1 kit 0   Continuous Glucose Sensor (FREESTYLE LIBRE 3 SENSOR) MISC Use as directed for blood sugar monitoring at least three times daily and as needed 6 each  3   Continuous Glucose Sensor (FREESTYLE LIBRE 3 SENSOR) MISC Use as directed for blood sugar monitoring at least three times daily and as needed 6 each 3   empagliflozin  (JARDIANCE ) 25 MG TABS tablet Take 1 tablet (25 mg total) by mouth in the morning. 90 tablet 3   Evolocumab  (REPATHA  SURECLICK) 140 MG/ML SOAJ Inject 140 mg into the skin every 14 (fourteen) days. Please keep your upcoming appointment for refills. 6 mL 3   furosemide  (LASIX ) 20 MG tablet Take 1 tablet (20 mg total) by mouth daily. May take an additional tablet as needed for weight gain of 2 lbs overnight or 5 lbs in one week. 135 tablet 2   glucose blood (ACCU-CHEK  GUIDE TEST) test strip Use to test Blood Sugars once daily 300 strip 3   glucose blood (ACCU-CHEK GUIDE) test strip Use 1 to test blood sugar levels everyday. 300 each 3   insulin  glargine (LANTUS  SOLOSTAR) 100 UNIT/ML Solostar Pen Inject 16 Units into the skin every morning AND 20 Units at bedtime. 12 mL 3   metoprolol  succinate (TOPROL -XL) 50 MG 24 hr tablet Take 1 tablet (50 mg total) by mouth at bedtime. Take with or immediately following a meal. 90 tablet 2   sacubitril -valsartan  (ENTRESTO ) 49-51 MG Take 1 tablet by mouth 2 (two) times daily. 180 tablet 3   spironolactone  (ALDACTONE ) 25 MG tablet Take 1 tablet (25 mg total) by mouth daily. 30 tablet 5   tamsulosin  (FLOMAX ) 0.4 MG CAPS capsule Take 1 capsule (0.4 mg total) by mouth daily. 30 capsule 11   tirzepatide  (MOUNJARO ) 2.5 MG/0.5ML Pen Inject 2.5 mg into the skin once a week. 6 mL 3   vitamin D3 (CHOLECALCIFEROL ) 25 MCG tablet Take 1 tablet (1,000 Units total) by mouth daily. 100 tablet 0

## 2024-05-13 NOTE — ED Notes (Signed)
 [facility] called.

## 2024-05-13 NOTE — Consult Note (Addendum)
 "  Cardiology Consultation   Patient ID: Thomas Gardner MRN: [id number]; DOB: 23-Jun-1955  Admit date: 05/13/2024 Date of Consult: 05/13/2024  PCP:  Maree Leni Edyth DELENA, MD   [facility] Providers Cardiologist:  Annabella Scarce, MD  Cardiology APP:  Vannie Reche RAMAN, NP       Patient Profile: Thomas Gardner is a 68 y.o. 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   History of Present Illness: Thomas Gardner reports that for the past 3-4 days, he has been having worsening dyspnea on exertion, PND, orthopnea, and leg swelling. He could not sleep last night due to orthopnea and therefore he presented to the ED. No chest pain. He endorses generalized abdominal discomfort with no nausea, vomiting; he attributes this to Monjaro, which he stopped taking as of last week. He believes he gained 15lb over thr past 1 month. He's been on Lasix  20 mg daily and has been taking it with no missed doses. No illicit drug or alcohol use.   In the ED, he was HDS but in sinus tachycardia. Troponin 145 =>482. proBNP 8752, Cr 2.06 (baseline 1.7). EKG with sinus tachycardia and nonspecific ST changes. CXR with bibasilar opacities and bilateral pleural effusion. He received Lasix  40 mg IV in the ED and was started on ceftriaxone  and azithro for concern of pneumonia. He was also started on heparin  gtt. Cardiology was subsequently consulted.  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   nueruologist---- dr rosemarie;    04-10-2022  per pt residul at times balance impaired   Arthritis    back shoulders and legs    Asthma    Bilateral lower extremity edema    @ times, goes away with legs elevated   CHF (congestive heart failure) (HCC)    Complication of anesthesia    woke up with endoscopy yrs ago   Diabetes mellitus type 2, insulin  dependent (HCC) 2003   Does mobilize using cane    Familial hyperlipidemia 05/07/2021   GERD (gastroesophageal reflux disease)    History of hiatal hernia    Hyperlipidemia    Hypertension    OSA (obstructive sleep apnea)    did not tolerate cpap, uses pillow for elevation   Prostate cancer [facility])     Past Surgical History:  Procedure Laterality Date   BREATH TEK H PYLORI  11/11/2011   Procedure: BREATH TEK H PYLORI;  Surgeon: Alm VEAR Angle, MD;  Location: [facility];  Service: General;  Laterality: N/A;   CARDIAC CATHETERIZATION  2011   per pt done by Dr Ladona at [facility] and Vascular,  told no bloackages   colonscopy  2018   CORONARY PRESSURE/FFR WITH 3D MAPPING N/A 03/11/2024   Procedure: Coronary Pressure/FFR w/3D Mapping;  Surgeon: Darron Deatrice DELENA, MD;  Location: [facility];  Service: Cardiovascular;  Laterality: N/A;   ESOPHAGOGASTRODUODENOSCOPY (EGD) WITH PROPOFOL  N/A 05/04/2014   Procedure: ESOPHAGOGASTRODUODENOSCOPY (EGD) WITH PROPOFOL ;  Surgeon: Lamar Donnald GAILS, MD;  Location: [facility] ENDOSCOPY;  Service: Endoscopy;  Laterality: N/A;   GOLD SEED IMPLANT N/A 04/16/2022   Procedure: GOLD SEED IMPLANT;  Surgeon: Elisabeth,  Maryellen D, MD;  Location: [facility];  Service: Urology;  Laterality: N/A;   LEFT HEART CATH AND CORONARY ANGIOGRAPHY N/A 03/11/2024   Procedure: LEFT HEART CATH AND CORONARY ANGIOGRAPHY;  Surgeon: Darron Deatrice LABOR, MD;  Location: [facility];  Service: Cardiovascular;  Laterality: N/A;     Home Medications:  Prior to Admission medications  Medication Sig Start Date End Date Taking? Authorizing Provider  Accu-Chek Softclix Lancets lancets Use to test blood sugar levels daily. 02/26/23      Accu-Chek Softclix Lancets lancets Use to test Blood Sugars daily. 02/16/24     acetaminophen  (TYLENOL ) 325 MG tablet Take 1-2 tablets (325-650 mg total) by mouth every 4 (four) hours as needed for mild pain. 03/14/21   Angiulli, Toribio PARAS, PA-C  albuterol  (VENTOLIN  HFA) 108 (90 Base) MCG/ACT inhaler Inhale 1-2 puffs into the lungs every 6 (six) hours as needed. 01/17/23     allopurinol  (ZYLOPRIM ) 100 MG tablet Take 1 tablet (100 mg total) by mouth daily. 05/05/24     ascorbic acid  (VITAMIN C ) 500 MG tablet Take 1 tablet (500 mg total) by mouth daily. 04/27/24     aspirin  EC 81 MG tablet Take 1 tablet (81 mg total) by mouth daily. 02/16/24     Blood Glucose Monitoring Suppl (ACCU-CHEK GUIDE) w/Device KIT use as directed 02/26/23     Blood Glucose Monitoring Suppl (ACCU-CHEK GUIDE) w/Device KIT Use to monitor Blood Sugars daily. 02/16/24     Continuous Glucose Sensor (FREESTYLE LIBRE 3 SENSOR) MISC Use as directed for blood sugar monitoring at least three times daily and as needed 02/26/23     Continuous Glucose Sensor (FREESTYLE LIBRE 3 SENSOR) MISC Use as directed for blood sugar monitoring at least three times daily and as needed 02/16/24     empagliflozin  (JARDIANCE ) 25 MG TABS tablet Take 1 tablet (25 mg total) by mouth in the morning. 10/16/23     Evolocumab  (REPATHA  SURECLICK) 140 MG/ML SOAJ Inject 140 mg into the skin every 14 (fourteen) days. Please keep your upcoming appointment for refills. 03/23/24   Raford Riggs, MD  furosemide  (LASIX ) 20 MG tablet Take 1 tablet (20 mg total) by mouth daily. May take an additional tablet as needed for weight gain of 2 lbs overnight or 5 lbs in one week. 04/13/24   Walker, Caitlin S, NP  glucose blood (ACCU-CHEK GUIDE TEST) test strip Use to test Blood Sugars once daily 02/16/24     glucose blood (ACCU-CHEK GUIDE) test strip Use 1 to test blood sugar levels everyday. 02/26/23     insulin  glargine (LANTUS  SOLOSTAR) 100 UNIT/ML Solostar Pen Inject 16 Units into the  skin every morning AND 20 Units at bedtime. 04/30/24     metoprolol  succinate (TOPROL -XL) 50 MG 24 hr tablet Take 1 tablet (50 mg total) by mouth at bedtime. Take with or immediately following a meal. 02/27/24   Vannie Reche RAMAN, NP  sacubitril -valsartan  (ENTRESTO ) 49-51 MG Take 1 tablet by mouth 2 (two) times daily. 03/24/24   Vannie Reche RAMAN, NP  spironolactone  (ALDACTONE ) 25 MG tablet Take 1 tablet (25 mg total) by mouth daily. 03/22/24     tamsulosin  (FLOMAX ) 0.4 MG CAPS capsule Take 1 capsule (0.4 mg total) by mouth daily. 08/27/23     tirzepatide  (MOUNJARO ) 2.5 MG/0.5ML Pen Inject 2.5 mg into the skin once a week. 12/18/23     vitamin D3 (CHOLECALCIFEROL ) 25 MCG tablet Take 1 tablet (1,000 Units total) by mouth daily. 04/27/24  Scheduled Meds:  allopurinol   100 mg Oral Daily   aspirin  EC  81 mg Oral Daily   furosemide   40 mg Intravenous BID   insulin  aspart  0-9 Units Subcutaneous TID WC   insulin  glargine  18 Units Subcutaneous BID   metoprolol  succinate  50 mg Oral QHS   spironolactone   25 mg Oral Daily   tamsulosin   0.4 mg Oral Daily   Continuous Infusions:  heparin  1,300 Units/hr (05/13/24 0448)   PRN Meds: acetaminophen  **OR** acetaminophen   Allergies:   Allergies[1]  Social History:   Social History   Socioeconomic History   Marital status: Single    Spouse name: Not on file   Number of children: 0   Years of education: Not on file   Highest education level: Not on file  Occupational History   Occupation: Beautician  Tobacco Use   Smoking status: Never    Passive exposure: Never   Smokeless tobacco: Never  Vaping Use   Vaping status: Never Used  Substance and Sexual Activity   Alcohol use: No    Alcohol/week: 0.0 standard drinks of alcohol   Drug use: No   Sexual activity: Not on file  Other Topics Concern   Not on file  Social History Narrative   Not on file   Social Drivers of Health   Tobacco Use: Low Risk (05/13/2024)   Patient History     Smoking Tobacco Use: Never    Smokeless Tobacco Use: Never    Passive Exposure: Never  Financial Resource Strain: Not on file  Food Insecurity: Not on file  Transportation Needs: Not on file  Physical Activity: Not on file  Stress: Not on file  Social Connections: Not on file  Intimate Partner Violence: Not on file  Depression (PHQ2-9): Low Risk (04/19/2021)   Depression (PHQ2-9)    PHQ-2 Score: 0  Alcohol Screen: Not on file  Housing: Not on file  Utilities: Not on file  Health Literacy: Not on file    Family History:   Family History  Problem Relation Age of Onset   Heart failure Mother    Cancer Mother        ovarian   Diabetes Mother    Stroke Father    Heart failure Sister    Heart failure Brother    Colon cancer Brother    Cancer Maternal Aunt        breast   Diabetes Maternal Aunt    Cancer Maternal Uncle        colon     ROS:  Please see the history of present illness.  All other ROS reviewed and negative.     Physical Exam/Data: Vitals:   05/13/24 0020 05/13/24 0245 05/13/24 0400 05/13/24 0409  BP:  111/71 124/76   Pulse:  (!) 101 100   Resp:  20 15   Temp:    98.9 F (37.2 C)  TempSrc:    Oral  SpO2:  100% 100%   Weight: 109.3 kg     Height: 5' 6 (1.676 m)       Intake/Output Summary (Last 24 hours) at 05/13/2024 0520 Last data filed at 05/13/2024 0325 Gross per 24 hour  Intake 342.58 ml  Output --  Net 342.58 ml      05/13/2024   12:20 AM 04/13/2024    8:10 AM 03/11/2024    7:44 AM  Last 3 Weights  Weight (lbs) 240 lb 15.4 oz 241 lb 224 lb  Weight (kg) 109.3  kg 109.317 kg 101.606 kg     Body mass index is 38.89 kg/m.  General:  Well nourished, well developed, in no acute distress HEENT: normal Neck: elevated JVD Vascular: distal pulses 2+ bilaterally Cardiac:  normal S1, S2; RRR; no murmur  Lungs:  decreased breath sounds bilaterally with bibasilar crackles  Abd: soft, nontender Ext: +1 bilateral LE edema Musculoskeletal:  No  deformities, BUE and BLE strength normal and equal Skin: warm and dry  Neuro:  CNs 2-12 intact, no focal abnormalities noted Psych:  Normal affect   EKG:  The EKG was personally reviewed and demonstrates:   Telemetry:  Telemetry was personally reviewed and demonstrates:    Relevant CV Studies: TTE 01/2024  1. Left ventricular ejection fraction, by estimation, is 35 to 40%. The  left ventricle has moderately decreased function. The left ventricle  demonstrates global hypokinesis. There is mild left ventricular  hypertrophy. Left ventricular diastolic  parameters are indeterminate.   2. Right ventricular systolic function is normal. The right ventricular  size is normal. Tricuspid regurgitation signal is inadequate for assessing  PA pressure.   3. Left atrial size was mildly dilated.   4. The mitral valve is grossly normal. Trivial mitral valve  regurgitation. No evidence of mitral stenosis.   5. The aortic valve is tricuspid. There is mild calcification of the  aortic valve. Aortic valve regurgitation is not visualized. No aortic  stenosis is present.   6. The inferior vena cava is normal in size with greater than 50%  respiratory variability, suggesting right atrial pressure of 3 mmHg.    LHC 02/2024   Dist LAD lesion is 80% stenosed.   Prox RCA lesion is 100% stenosed.   Prox LAD to Mid LAD lesion is 70% stenosed.   1.  Codominant coronary arteries with significant two-vessel coronary artery disease.  The right coronary artery is occluded proximally with faint right to right and left to right collaterals.  There is heavily calcified stenosis in the proximal LAD which was significant by virtual FFR (0.50).  2.  Left ventricular angiography was not performed due to chronic kidney disease. 3.  LVEDP was significantly elevated at 31 mmHg after aggressive precath hydration.    Laboratory Data: High Sensitivity Troponin:  No results for input(s): TROPONINIHS in the last 720 hours.   Recent Labs  Lab 05/13/24 0051 05/13/24 0333  TRNPT 145* 482*      Chemistry Recent Labs  Lab 05/13/24 0051  NA 141  K 3.7  CL 107  CO2 23  GLUCOSE 200*  BUN 27*  CREATININE 2.06*  CALCIUM  8.0*  GFRNONAA 34*  ANIONGAP 12    No results for input(s): PROT, ALBUMIN, AST, ALT, ALKPHOS, BILITOT in the last 168 hours. Lipids No results for input(s): CHOL, TRIG, HDL, LABVLDL, LDLCALC, CHOLHDL in the last 168 hours.  Hematology Recent Labs  Lab 05/13/24 0051  WBC 11.8*  RBC 4.47  HGB 11.7*  HCT 36.8*  MCV 82.3  MCH 26.2  MCHC 31.8  RDW 15.5  PLT 198   Thyroid No results for input(s): TSH, FREET4 in the last 168 hours.  BNP Recent Labs  Lab 05/13/24 0051  PROBNP 8,752.0*    DDimer No results for input(s): DDIMER in the last 168 hours.  Radiology/Studies:  DG Chest Port 1 View Result Date: 05/13/2024 EXAM: 1 VIEW(S) XRAY OF THE CHEST 05/13/2024 12:52:48 AM COMPARISON: None available. CLINICAL HISTORY: sob FINDINGS: LUNGS AND PLEURA: Low lung volumes. Layering bilateral pleural effusions. Bibasilar  airspace opacities. No pneumothorax. HEART AND MEDIASTINUM: Cardiomegaly. BONES AND SOFT TISSUES: No acute osseous abnormality. IMPRESSION: 1. Layering bilateral pleural effusions with bibasilar atelectasis or pneumonia. Electronically signed by: Norman Gatlin MD 05/13/2024 01:02 AM EST RP Workstation: [id number]     Assessment and Plan: Acute on chronic HFrEF Presents with 3 days of worsening dyspnea, PND, orthopnea, and leg swelling. Exam with elevated JVD and +1 bialteral LE edema. ProBNP 8752. CXR with bibasilar opacities and bilateral pleural effusion. No signs of cardiogenic shock. Overall, picture is consistent with acute on chronic HFrEF exacerbation.  - S/P Lasix  40 mg IV; monitor response. Will likely need redosing/higher dose - Continue metoprolol  succinate 50 mg daily, Entresto  40-51 mg BID, Jardiance  25 mg daily, and spiro 25 mg  daily  2. Elevated troponin, likely myocardial injury  3. CAD Has known unvascularized CAD. As per notes in 03/2024, he was discussed in the Heart team who recommended optimizing HF and volume overload with low threshold to proceed with LAD PCI (will likely require lithotripsy). Presents with elevated troponin with significant delta 145 =>482; besides dyspnea, which is better explained by his HF exacerbation, he has no real angina symptoms. His troponin elevation likely represents myocardial injury in the setting of acute on chronic HFrEF exacerbation.  - Follow-up TTE and if no new WMA, would stop heparin  gtt as this is unlikely to be ACS - Continue home aspirin   - Unclear why he's not on statin as no documented intolerance. Recommend starting rosuvastatin  20 mg  Risk Assessment/Risk Scores:       [place]  Heart Association (NYHA) Functional Class NYHA Class III       For questions or updates, please contact [facility] Please consult [email or web address] for contact info under      Signed, Gillian CHRISTELLA Cass, MD  05/13/2024 5:20 AM    Attending Addendum:   History and all data above reviewed.  Patient examined.  I agree with the findings as above.  The patient exam reveals:  VS:  BP (!) 142/78   Pulse 96   Temp 98.2 F (36.8 C) (Oral)   Resp 20   Ht 5' 6 (1.676 m)   Wt 109.3 kg   SpO2 100%   BMI 38.89 kg/m  , BMI Body mass index is 38.89 kg/m. GENERAL:  Well appearing HEENT: Pupils equal round and reactive, fundi not visualized, oral mucosa unremarkable NECK:  No jugular venous distention, waveform within normal limits, carotid upstroke brisk and symmetric, no bruits, no thyromegaly LUNGS:  Clear to auscultation bilaterally HEART:  RRR.  PMI not displaced or sustained,S1 and S2 within normal limits, no S3, no S4, no clicks, no rubs, no murmurs ABD:  Flat, positive bowel sounds normal in frequency in pitch, no bruits, no rebound, no guarding, no midline pulsatile  mass, no hepatomegaly, no splenomegaly EXT:  2 plus pulses throughout, 2+ LE edema to upper tibia bilaterally.  no cyanosis no clubbing SKIN:  No rashes no nodules NEURO:  Cranial nerves II through XII grossly intact, motor grossly intact throughout PSYCH:  Cognitively intact, oriented to person place and time   All available labs, radiology testing, previous records reviewed. Agree with documented assessment and plan. Thomas Gardner is a 45M with HFrEF, ischemic cardiomyopathy, HTN, HL, DM, CKD 3b morbid obesity, OSA and CVA her with acute on chronic HFrEF.      # Acute on chronic HFrEF:  LVEF 35-40%.  Repeat pending.  He is clearly volume overloaded on exam and  already improving with diuresis.  Continue current management.  He was on Jardiance  as an outpatient and no longer on his list,  Need to inquire why and resume if able. Continue metoprolol  and spironolactone .  Home Entresto  on hold for now for mild acute on chronic renal failure one admission.  Resume prior to discharge.   # CAD:  # Demand ischemia:  # Hyperlipidemia:  # Prior CVA:  Hs-troponin is elevated, but likely demand ischemia.  He has known complex CAD that is being medically managed.  Continue with this plan unless LVEF looks worse or he has ischemic symptoms.  He isn't on a statin 2/2 myalgias.  Continue Repatha  and remove the rosuvastatin .  Continue metoprolol  and aspirin .    Judas Mohammad C. Raford, MD, [facility]  05/13/2024 9:33 AM       [1]  Allergies Allergen Reactions   Erythromycin Nausea And Vomiting   "

## 2024-05-13 NOTE — ED Triage Notes (Signed)
 BIB [facility] from home with SOB x 3 days that has gotten progressively worse . EMS reports Bilat Rales with auscultation and bilat leg swelling. EMS reports that pt was in the 70s Spo2 upon [employer] arrival and pt increased to 93% on Cpap. 2 SL 0.4 mg nitroglycerin .  BP 170/100 HR 120s Spo2 92% CPAP  CBG 270 20g  LAC

## 2024-05-13 NOTE — H&P (Signed)
 " History and Physical    Thomas Gardner FMW:[id number] DOB: 01/27/1956 DOA: 05/13/2024  Patient coming from: Home.  Chief Complaint: Shortness of breath.  HPI: Thomas Gardner is a 68 y.o. male with history of chronic HFrEF last EF measured on September 2025 was 35 to 40%, has had cardiac cath in October 2025 which showedDist LAD lesion is 80% stenosed,Prox RCA lesion is 100% stenosed, Prox LAD to Mid LAD lesion is 70% stenose, diabetes mellitus type 2, chronic disease stage III, gout, sleep apnea, history of prostate cancer, history of stroke presents to the ER because of increasing shortness of breath with exertion over the last 2 to 3 days.  Patient states he also has been having some orthopnea and has noticed some weight gain and lower extremity edema more than usual.  Over the last one week patient has been having productive cough denies any fever or chills.  He has been taking Lasix  despite which he was getting more short of breath and decided to come to the ER.  He also has been having chest tightness on exertion last few days.  ED Course: In the ER x-ray showed bilateral pleural effusion and possible pneumonia.  Labs show increasing troponin trends of 145-482.  EKG shows sinus tachycardia.  Creatinine is 2.06 proBNP is 8700.  COVID and flu test were negative WBC 11.8 hemoglobin 11.7.  Patient was started on heparin  given 40 mg IV Lasix  admitted for acute on chronic HFrEF with possible non-ST elevation MI.  Review of Systems: As per HPI, rest all negative.   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   nueruologist---- dr rosemarie;    04-10-2022  per pt residul at times balance impaired   Arthritis    back shoulders and legs   Asthma    Bilateral lower extremity edema    @ times, goes away with legs elevated   CHF (congestive heart failure) (HCC)    Complication of anesthesia    woke up with endoscopy yrs ago   Diabetes mellitus type 2,  insulin  dependent (HCC) 2003   Does mobilize using cane    Familial hyperlipidemia 05/07/2021   GERD (gastroesophageal reflux disease)    History of hiatal hernia    Hyperlipidemia    Hypertension    OSA (obstructive sleep apnea)    did not tolerate cpap, uses pillow for elevation   Prostate cancer [facility])     Past Surgical History:  Procedure Laterality Date   BREATH TEK H PYLORI  11/11/2011   Procedure: BREATH TEK H PYLORI;  Surgeon: Alm VEAR Angle, MD;  Location: [facility];  Service: General;  Laterality: N/A;   CARDIAC CATHETERIZATION  2011   per pt done by Dr Ladona at [facility],  told no bloackages   colonscopy  2018   CORONARY PRESSURE/FFR WITH 3D MAPPING N/A 03/11/2024   Procedure: Coronary Pressure/FFR w/3D Mapping;  Surgeon: Darron Deatrice LABOR, MD;  Location: [facility];  Service: Cardiovascular;  Laterality: N/A;   ESOPHAGOGASTRODUODENOSCOPY (EGD) WITH PROPOFOL  N/A 05/04/2014   Procedure: ESOPHAGOGASTRODUODENOSCOPY (EGD) WITH PROPOFOL ;  Surgeon: Lamar Donnald GAILS, MD;  Location: [facility];  Service: Endoscopy;  Laterality: N/A;   GOLD SEED IMPLANT N/A 04/16/2022   Procedure: GOLD SEED IMPLANT;  Surgeon: Elisabeth Valli BIRCH, MD;  Location: [facility];  Service: Urology;  Laterality: N/A;   LEFT HEART CATH AND CORONARY ANGIOGRAPHY N/A 03/11/2024   Procedure:  LEFT HEART CATH AND CORONARY ANGIOGRAPHY;  Surgeon: Darron Deatrice LABOR, MD;  Location: [facility];  Service: Cardiovascular;  Laterality: N/A;     reports that he has never smoked. He has never been exposed to tobacco smoke. He has never used smokeless tobacco. He reports that he does not drink alcohol and does not use drugs.  Allergies[1]  Family History  Problem Relation Age of Onset   Heart failure Mother    Cancer Mother        ovarian   Diabetes Mother    Stroke Father    Heart failure Sister    Heart failure Brother    Colon cancer Brother    Cancer  Maternal Aunt        breast   Diabetes Maternal Aunt    Cancer Maternal Uncle        colon    Prior to Admission medications  Medication Sig Start Date End Date Taking? Authorizing Provider  Accu-Chek Softclix Lancets lancets Use to test blood sugar levels daily. 02/26/23     Accu-Chek Softclix Lancets lancets Use to test Blood Sugars daily. 02/16/24     acetaminophen  (TYLENOL ) 325 MG tablet Take 1-2 tablets (325-650 mg total) by mouth every 4 (four) hours as needed for mild pain. 03/14/21   Angiulli, Toribio PARAS, PA-C  albuterol  (VENTOLIN  HFA) 108 (90 Base) MCG/ACT inhaler Inhale 1-2 puffs into the lungs every 6 (six) hours as needed. 01/17/23     allopurinol  (ZYLOPRIM ) 100 MG tablet Take 1 tablet (100 mg total) by mouth daily. 05/05/24     ascorbic acid  (VITAMIN C ) 500 MG tablet Take 1 tablet (500 mg total) by mouth daily. 04/27/24     aspirin  EC 81 MG tablet Take 1 tablet (81 mg total) by mouth daily. 02/16/24     Blood Glucose Monitoring Suppl (ACCU-CHEK GUIDE) w/Device KIT use as directed 02/26/23     Blood Glucose Monitoring Suppl (ACCU-CHEK GUIDE) w/Device KIT Use to monitor Blood Sugars daily. 02/16/24     Continuous Glucose Sensor (FREESTYLE LIBRE 3 SENSOR) MISC Use as directed for blood sugar monitoring at least three times daily and as needed 02/26/23     Continuous Glucose Sensor (FREESTYLE LIBRE 3 SENSOR) MISC Use as directed for blood sugar monitoring at least three times daily and as needed 02/16/24     empagliflozin  (JARDIANCE ) 25 MG TABS tablet Take 1 tablet (25 mg total) by mouth in the morning. 10/16/23     Evolocumab  (REPATHA  SURECLICK) 140 MG/ML SOAJ Inject 140 mg into the skin every 14 (fourteen) days. Please keep your upcoming appointment for refills. 03/23/24   Raford Riggs, MD  furosemide  (LASIX ) 20 MG tablet Take 1 tablet (20 mg total) by mouth daily. May take an additional tablet as needed for weight gain of 2 lbs overnight or 5 lbs in one week. 04/13/24   Walker, Caitlin S,  NP  glucose blood (ACCU-CHEK GUIDE TEST) test strip Use to test Blood Sugars once daily 02/16/24     glucose blood (ACCU-CHEK GUIDE) test strip Use 1 to test blood sugar levels everyday. 02/26/23     insulin  glargine (LANTUS  SOLOSTAR) 100 UNIT/ML Solostar Pen Inject 16 Units into the skin every morning AND 20 Units at bedtime. 04/30/24     metoprolol  succinate (TOPROL -XL) 50 MG 24 hr tablet Take 1 tablet (50 mg total) by mouth at bedtime. Take with or immediately following a meal. 02/27/24   Vannie Reche RAMAN, NP  sacubitril -valsartan  (ENTRESTO ) [phone]  MG Take 1 tablet by mouth 2 (two) times daily. 03/24/24   Vannie Reche RAMAN, NP  spironolactone  (ALDACTONE ) 25 MG tablet Take 1 tablet (25 mg total) by mouth daily. 03/22/24     tamsulosin  (FLOMAX ) 0.4 MG CAPS capsule Take 1 capsule (0.4 mg total) by mouth daily. 08/27/23     tirzepatide  (MOUNJARO ) 2.5 MG/0.5ML Pen Inject 2.5 mg into the skin once a week. 12/18/23     vitamin D3 (CHOLECALCIFEROL ) 25 MCG tablet Take 1 tablet (1,000 Units total) by mouth daily. 04/27/24       Physical Exam: Constitutional: Moderately built and nourished. Vitals:   05/13/24 0020 05/13/24 0245 05/13/24 0400 05/13/24 0409  BP:  111/71 124/76   Pulse:  (!) 101 100   Resp:  20 15   Temp:    98.9 F (37.2 C)  TempSrc:    Oral  SpO2:  100% 100%   Weight: 109.3 kg     Height: 5' 6 (1.676 m)      Eyes: Anicteric no pallor. ENMT: No discharge from the ears eyes nose or mouth. Neck: No mass felt.  No neck rigidity.  JVD elevated. Respiratory: No rhonchi or crepitations. Cardiovascular: S1-S2 heard. Abdomen: Soft nontender bowel sound present. Musculoskeletal: Bilateral lower extremity edema present. Skin: No rash. Neurologic: Alert awake oriented to time place and person.  Moves all extremities. Psychiatric: Appears normal.  Normal affect.   Labs on Admission: I have personally reviewed following labs and imaging studies  CBC: Recent Labs  Lab 05/13/24 0051  WBC  11.8*  HGB 11.7*  HCT 36.8*  MCV 82.3  PLT 198   Basic Metabolic Panel: Recent Labs  Lab 05/13/24 0051  NA 141  K 3.7  CL 107  CO2 23  GLUCOSE 200*  BUN 27*  CREATININE 2.06*  CALCIUM  8.0*   GFR: Estimated Creatinine Clearance: 39.8 mL/min (A) (by C-G formula based on SCr of 2.06 mg/dL (H)). Liver Function Tests: No results for input(s): AST, ALT, ALKPHOS, BILITOT, PROT, ALBUMIN in the last 168 hours. No results for input(s): LIPASE, AMYLASE in the last 168 hours. No results for input(s): AMMONIA in the last 168 hours. Coagulation Profile: No results for input(s): INR, PROTIME in the last 168 hours. Cardiac Enzymes: No results for input(s): CKTOTAL, CKMB, CKMBINDEX, TROPONINI in the last 168 hours. BNP (last 3 results) Recent Labs    05/13/24 0051  PROBNP 8,752.0*   HbA1C: No results for input(s): HGBA1C in the last 72 hours. CBG: No results for input(s): GLUCAP in the last 168 hours. Lipid Profile: No results for input(s): CHOL, HDL, LDLCALC, TRIG, CHOLHDL, LDLDIRECT in the last 72 hours. Thyroid Function Tests: No results for input(s): TSH, T4TOTAL, FREET4, T3FREE, THYROIDAB in the last 72 hours. Anemia Panel: No results for input(s): VITAMINB12, FOLATE, FERRITIN, TIBC, IRON , RETICCTPCT in the last 72 hours. Urine analysis:    Component Value Date/Time   COLORURINE YELLOW 03/01/2021 1642   APPEARANCEUR CLEAR 03/01/2021 1642   LABSPEC 1.025 03/01/2021 1642   PHURINE 5.0 03/01/2021 1642   GLUCOSEU >=500 (A) 03/01/2021 1642   HGBUR NEGATIVE 03/01/2021 1642   BILIRUBINUR NEGATIVE 03/01/2021 1642   KETONESUR NEGATIVE 03/01/2021 1642   PROTEINUR 100 (A) 03/01/2021 1642   UROBILINOGEN 0.2 08/24/2013 1328   NITRITE NEGATIVE 03/01/2021 1642   LEUKOCYTESUR NEGATIVE 03/01/2021 1642   Sepsis Labs: @LABRCNTIP (procalcitonin:4,lacticidven:4) ) Recent Results (from the past 240 hours)  Resp panel by  RT-PCR (RSV, Flu A&B, Covid) Anterior Nasal Swab  Status: None   Collection Time: 05/13/24  3:33 AM   Specimen: Anterior Nasal Swab  Result Value Ref Range Status   SARS Coronavirus 2 by RT PCR NEGATIVE NEGATIVE Final   Influenza A by PCR NEGATIVE NEGATIVE Final   Influenza B by PCR NEGATIVE NEGATIVE Final    Comment: (NOTE) The Xpert Xpress SARS-CoV-2/FLU/RSV plus assay is intended as an aid in the diagnosis of influenza from Nasopharyngeal swab specimens and should not be used as a sole basis for treatment. Nasal washings and aspirates are unacceptable for Xpert Xpress SARS-CoV-2/FLU/RSV testing.  Fact Sheet for Patients: bloggercourse.com  Fact Sheet for Healthcare Providers: seriousbroker.it  This test is not yet approved or cleared by the [place]  FDA and has been authorized for detection and/or diagnosis of SARS-CoV-2 by FDA under an Emergency Use Authorization (EUA). This EUA will remain in effect (meaning this test can be used) for the duration of the COVID-19 declaration under Section 564(b)(1) of the Act, 21 U.S.C. section 360bbb-3(b)(1), unless the authorization is terminated or revoked.     Resp Syncytial Virus by PCR NEGATIVE NEGATIVE Final    Comment: (NOTE) Fact Sheet for Patients: bloggercourse.com  Fact Sheet for Healthcare Providers: seriousbroker.it  This test is not yet approved or cleared by the [place]  FDA and has been authorized for detection and/or diagnosis of SARS-CoV-2 by FDA under an Emergency Use Authorization (EUA). This EUA will remain in effect (meaning this test can be used) for the duration of the COVID-19 declaration under Section 564(b)(1) of the Act, 21 U.S.C. section 360bbb-3(b)(1), unless the authorization is terminated or revoked.  Performed at [facility], [address] [place], [place] [address]       Radiological Exams on Admission: DG Chest Port 1 View Result Date: 05/13/2024 EXAM: 1 VIEW(S) XRAY OF THE CHEST 05/13/2024 12:52:48 AM COMPARISON: None available. CLINICAL HISTORY: sob FINDINGS: LUNGS AND PLEURA: Low lung volumes. Layering bilateral pleural effusions. Bibasilar airspace opacities. No pneumothorax. HEART AND MEDIASTINUM: Cardiomegaly. BONES AND SOFT TISSUES: No acute osseous abnormality. IMPRESSION: 1. Layering bilateral pleural effusions with bibasilar atelectasis or pneumonia. Electronically signed by: Norman Gatlin MD 05/13/2024 01:02 AM EST RP Workstation: [id number]    EKG: Independently reviewed.  Sinus tachycardia.  Assessment/Plan Principal Problem:   Acute on chronic HFrEF (heart failure with reduced ejection fraction) (HCC) Active Problems:   Essential hypertension   Hyperlipidemia   Gout   Sleep apnea   Type 2 diabetes mellitus with hyperglycemia (HCC)   Acute renal failure superimposed on stage 3b chronic kidney disease (HCC)   Anemia due to chronic kidney disease   Elevated troponin   CAP (community acquired pneumonia)    Acute on chronic HFrEF last EF measured in September 2025 was 35 to 40%.  Patient takes Lasix  20 mg at home.  Will keep patient on 40 mg IV every 12 and closely follow intake output metabolic panel.  Due to worsening renal function we will hold Entresto  and Jardiance  for now.  Continue metoprolol  and spironolactone . Elevated troponin concerning for non-ST elevation MI given the recent cardiac cath showing -   Dist LAD lesion is 80% stenosed, prox RCA lesion is 100% stenosed, prox LAD to Mid LAD lesion is 70% stenoses patient has been started on heparin  infusion and cardiology consulted.  Will continue with aspirin  and beta-blockers.  Awaiting further recommendations from cardiology. Acute on chronic kidney disease stage III creatinine has increased from 1.7 in October 2025 and it is around 2.06.  Possible cardiorenal syndrome.  For now we  will hold Entresto  and Jardiance .  Patient receiving IV Lasix .  Closely follow intake output and metabolic panel.  If creatinine does not improve may consider renal imaging.  Check UA. Possible pneumonia with patient having productive cough will keep patient empiric antibiotics and check procalcitonin and if negative may discontinue antibiotics. Diabetes mellitus type 2 takes Lantus .  No recent hemoglobin A1c in the chart.  Check hemoglobin A1c continue home dose of Lantus  with sliding scale coverage.  Patient also takes Jardiance  and Mounjaro  at home. Anemia appears to be new.  Follow CBC and anemia panel with next lab draw. History of gout on allopurinol . Prior history of stroke on aspirin  and Repatha . Sleep apnea CPAP at bedtime.  Since patient has acute on chronic HFrEF with elevated troponin will need further management IV diuresis and more than 2 midnight stay.  DVT prophylaxis: Heparin  infusion. Code Status: Full code. Family Communication: [place] with patient. Disposition Plan: Progressive care. Consults called: Cardiology. Admission status: Patient.         [1]  Allergies Allergen Reactions   Erythromycin Nausea And Vomiting   "

## 2024-05-14 ENCOUNTER — Other Ambulatory Visit (HOSPITAL_COMMUNITY): Payer: Self-pay

## 2024-05-14 DIAGNOSIS — I5023 Acute on chronic systolic (congestive) heart failure: Secondary | ICD-10-CM | POA: Diagnosis not present

## 2024-05-14 DIAGNOSIS — R7989 Other specified abnormal findings of blood chemistry: Secondary | ICD-10-CM | POA: Diagnosis not present

## 2024-05-14 DIAGNOSIS — I471 Supraventricular tachycardia, unspecified: Secondary | ICD-10-CM | POA: Diagnosis not present

## 2024-05-14 DIAGNOSIS — I493 Ventricular premature depolarization: Secondary | ICD-10-CM

## 2024-05-14 DIAGNOSIS — I25810 Atherosclerosis of coronary artery bypass graft(s) without angina pectoris: Secondary | ICD-10-CM | POA: Diagnosis not present

## 2024-05-14 LAB — CBC
HCT: 35.5 % — ABNORMAL LOW (ref 39.0–52.0)
Hemoglobin: 11.4 g/dL — ABNORMAL LOW (ref 13.0–17.0)
MCH: 26 pg (ref 26.0–34.0)
MCHC: 32.1 g/dL (ref 30.0–36.0)
MCV: 80.9 fL (ref 80.0–100.0)
Platelets: 191 K/uL (ref 150–400)
RBC: 4.39 MIL/uL (ref 4.22–5.81)
RDW: 15.4 % (ref 11.5–15.5)
WBC: 8.5 K/uL (ref 4.0–10.5)
nRBC: 0 % (ref 0.0–0.2)

## 2024-05-14 LAB — BASIC METABOLIC PANEL WITH GFR
Anion gap: 11 (ref 5–15)
BUN: 29 mg/dL — ABNORMAL HIGH (ref 8–23)
CO2: 23 mmol/L (ref 22–32)
Calcium: 8.2 mg/dL — ABNORMAL LOW (ref 8.9–10.3)
Chloride: 105 mmol/L (ref 98–111)
Creatinine, Ser: 1.81 mg/dL — ABNORMAL HIGH (ref 0.61–1.24)
GFR, Estimated: 40 mL/min — ABNORMAL LOW
Glucose, Bld: 98 mg/dL (ref 70–99)
Potassium: 3.5 mmol/L (ref 3.5–5.1)
Sodium: 139 mmol/L (ref 135–145)

## 2024-05-14 LAB — GLUCOSE, CAPILLARY
Glucose-Capillary: 51 mg/dL — ABNORMAL LOW (ref 70–99)
Glucose-Capillary: 52 mg/dL — ABNORMAL LOW (ref 70–99)
Glucose-Capillary: 109 mg/dL — ABNORMAL HIGH (ref 70–99)
Glucose-Capillary: 112 mg/dL — ABNORMAL HIGH (ref 70–99)
Glucose-Capillary: 62 mg/dL — ABNORMAL LOW (ref 70–99)
Glucose-Capillary: 118 mg/dL — ABNORMAL HIGH (ref 70–99)
Glucose-Capillary: 98 mg/dL (ref 70–99)

## 2024-05-14 LAB — MAGNESIUM: Magnesium: 2.1 mg/dL (ref 1.7–2.4)

## 2024-05-14 LAB — HEPARIN LEVEL (UNFRACTIONATED): Heparin Unfractionated: 0.59 [IU]/mL (ref 0.30–0.70)

## 2024-05-14 MED ORDER — ALBUTEROL SULFATE HFA 108 (90 BASE) MCG/ACT IN AERS
1.0000 | INHALATION_SPRAY | Freq: Four times a day (QID) | RESPIRATORY_TRACT | 2 refills | Status: AC | PRN
  Filled 2024-05-14: qty 6.7, 25d supply, fill #0

## 2024-05-14 MED ORDER — POTASSIUM CHLORIDE CRYS ER 20 MEQ PO TBCR
40.0000 meq | EXTENDED_RELEASE_TABLET | Freq: Once | ORAL | Status: AC
  Administered 2024-05-14: 40 meq via ORAL
  Filled 2024-05-14: qty 2

## 2024-05-14 MED ORDER — FUROSEMIDE 10 MG/ML IJ SOLN
40.0000 mg | Freq: Two times a day (BID) | INTRAMUSCULAR | Status: DC
  Administered 2024-05-14 – 2024-05-15 (×3): 40 mg via INTRAVENOUS
  Filled 2024-05-14 (×3): qty 4

## 2024-05-14 MED ORDER — AMIODARONE HCL 200 MG PO TABS
400.0000 mg | ORAL_TABLET | Freq: Two times a day (BID) | ORAL | Status: DC
  Administered 2024-05-14 – 2024-05-16 (×6): 400 mg via ORAL
  Filled 2024-05-14 (×6): qty 2

## 2024-05-14 NOTE — Evaluation (Signed)
 Physical Therapy Evaluation Patient Details Name: Thomas Gardner MRN: [id number] DOB: [birth year] Today's Date: 05/14/2024  History of Present Illness  Pt is a 68 y/o male admitted 05/13/24 with shortness of breath. Bil pleural effusions with ?pna; acute on chronic CHF (EF <20%); elevated troponin likely myocardial injury per Cardiology; PMH includes: arthritis, CHF, CAD, DM, HTN, morbid obesity, CKD, gout, prostate cancer, CVA.  Clinical Impression   Pt admitted secondary to problem above with deficits below. PTA patient lives alone and walks with a cane. He has had at least 2 falls in past 6 mos (feet get tangled up.) He is hoping to go to a friend's home on discharge, however he will have numerous stairs to go up at their place. Pt currently requires bil UE support and CGA to ambulate 40 ft on RA with sats 96%. Patient can benefit from PT for RW education, stair training and strengthening. Anticipate patient will benefit from PT to address problems listed below. Will continue to follow acutely to maximize functional mobility, independence, and safety.  REcommend HHPT on discharge.          If plan is discharge home, recommend the following: Assistance with cooking/housework;Help with stairs or ramp for entrance   Can travel by private vehicle        Equipment Recommendations Rolling walker (2 wheels)  Recommendations for Thomas Services  OT consult    Functional Status Assessment Patient has had a recent decline in their functional status and demonstrates the ability to make significant improvements in function in a reasonable and predictable amount of time.     Precautions / Restrictions Precautions Precautions: Fall Recall of Precautions/Restrictions: Intact      Mobility  Bed Mobility Overal bed mobility: Modified Independent             General bed mobility comments: HOB elevated    Transfers Overall transfer level: Needs assistance Equipment used: 1 person  hand held assist Transfers: Sit to/from Stand Sit to Stand: Contact guard assist           General transfer comment: pt reaching for IV pole; once standing transitioned from HHA to pole    Ambulation/Gait Ambulation/Gait assistance: Contact guard assist Gait Distance (Feet): 40 Feet Assistive device: IV Pole Gait Pattern/deviations: Step-through pattern, Decreased stride length   Gait velocity interpretation: <1.8 ft/sec, indicate of risk for recurrent falls   General Gait Details: pt holding IV pole and 2nd UE reaching for surfaces in the room for support; discussed current need for RW and pt in agreement; educated to begin using with nursing to/from bathroom  Stairs            Wheelchair Mobility     Tilt Bed    Modified Rankin (Stroke Patients Only)       Balance Overall balance assessment: Mild deficits observed, not formally tested                                           Pertinent Vitals/Pain Pain Assessment Pain Assessment: Faces Faces Pain Scale: Hurts little more Pain Location: abd Pain Descriptors / Indicators: Tightness Pain Intervention(s): Limited activity within patient's tolerance, Monitored during session, Repositioned    Home Living Family/patient expects to be discharged to:: Private residence Living Arrangements: Alone Available Help at Discharge: Friend(s);Available PRN/intermittently Type of Home: House Home Access: Stairs to enter Entrance Stairs-Rails: None Entrance [employer]  of Steps: 2   Home Layout: One level Home Equipment: Grab bars - toilet;Grab bars - tub/shower;Cane - single point;Shower seat Additional Comments: *may go to stay with a friend; information is for his place    Prior Function Prior Level of Function : Independent/Modified Independent;Driving;History of Falls (last six months)             Mobility Comments: uses cane for stability; fell over his feet twice in 6 mos ADLs Comments:  does his own grocery shopping     Extremity/Trunk Assessment   Upper Extremity Assessment Upper Extremity Assessment: Overall WFL for tasks assessed    Lower Extremity Assessment Lower Extremity Assessment: Generalized weakness (legs noticeably shaking during ambulation)    Cervical / Trunk Assessment Cervical / Trunk Assessment: Thomas exceptions Cervical / Trunk Exceptions: overweight  Communication   Communication Communication: No apparent difficulties    Cognition Arousal: Alert Behavior During Therapy: WFL for tasks assessed/performed   PT - Cognitive impairments: No apparent impairments                         Following commands: Intact       Cueing Cueing Techniques: Verbal cues     General Comments General comments (skin integrity, edema, etc.): HR 102-106; sats 96% on RA while walking; dyspnea 2/4    Exercises Thomas Exercises Thomas Exercises: educated on importance of ankle pumps and heelslides when supine Thomas Exercises: educated on pursed lip breathing and pt able to return demonstrate at rest   Assessment/Plan    PT Assessment Patient needs continued PT services  PT Problem List Decreased strength;Decreased activity tolerance;Decreased balance;Decreased mobility;Decreased knowledge of use of DME;Cardiopulmonary status limiting activity;Obesity       PT Treatment Interventions DME instruction;Gait training;Stair training;Functional mobility training;Therapeutic activities;Therapeutic exercise;Balance training;Patient/family education    PT Goals (Current goals can be found in the Care Plan section)  Acute Rehab PT Goals Patient Stated Goal: improve his shortness of breath before going home PT Goal Formulation: With patient Time For Goal Achievement: 05/28/24 Potential to Achieve Goals: Good    Frequency Min 2X/week     Co-evaluation               AM-PAC PT 6 Clicks Mobility  Outcome Measure Help needed turning from your  back to your side while in a flat bed without using bedrails?: None Help needed moving from lying on your back to sitting on the side of a flat bed without using bedrails?: None Help needed moving to and from a bed to a chair (including a wheelchair)?: A Little Help needed standing up from a chair using your arms (e.g., wheelchair or bedside chair)?: A Little Help needed to walk in hospital room?: A Little Help needed climbing 3-5 steps with a railing? : A Lot 6 Click Score: 19    End of Session   Activity Tolerance: Patient limited by fatigue Patient left: with call bell/phone within reach;in bed;with nursing/sitter in room Nurse Communication: Mobility status;Thomas (comment) (recommend using RW) PT Visit Diagnosis: Unsteadiness on feet (R26.81);History of falling (Z91.81)    Time: 8478-8445 PT Time Calculation (min) (ACUTE ONLY): 33 min   Charges:   PT Evaluation $PT Eval Low Complexity: 1 Low PT Treatments $Therapeutic Activity: 8-22 mins PT General Charges $$ ACUTE PT VISIT: 1 Visit          Macario RAMAN, PT Acute Rehabilitation Services  Office [phone]   Macario SHAUNNA Soja 05/14/2024, 4:09 PM

## 2024-05-14 NOTE — Inpatient Diabetes Management (Addendum)
 Inpatient Diabetes Program Recommendations  AACE/ADA: New Consensus Statement on Inpatient Glycemic Control (2015)  Target Ranges:  Prepandial:   less than 140 mg/dL      Peak postprandial:   less than 180 mg/dL (1-2 hours)      Critically ill patients:  140 - 180 mg/dL   Lab Results  Component Value Date   GLUCAP 109 (H) 05/14/2024   HGBA1C 6.8 (H) 05/13/2024    Review of Glycemic Control  Latest Reference Range & Units 05/13/24 21:18 05/14/24 05:54 05/14/24 06:00 05/14/24 06:31  Glucose-Capillary 70 - 99 mg/dL 888 (H) 52 (L) 51 (L) 890 (H)   Diabetes history: DM 2 Outpatient Diabetes medications:  Jardiance  12.5 mg bid Lantus  16 units q AM and 20 units q HS Mounjaro  2.5 mg weekly Current orders for Inpatient glycemic control:  Novolog  0-9 units tid with meals  Lantus  18 units bid Jardiance  10 mg daily Inpatient Diabetes Program Recommendations:    Note low blood sugars. Please reduce Lantus  to 12 units bid (consider holding tonight's dose of Lantus  since he has received AM dose).    Thanks,  Randall Bullocks, RN, BC-ADM Inpatient Diabetes Coordinator Pager [phone]  (8a-5p)

## 2024-05-14 NOTE — Progress Notes (Signed)
 " PROGRESS NOTE    CLEDITH ABDOU  FMW:[id number] DOB: 06/03/1955 DOA: 05/13/2024 PCP: Maree Leni Edyth DELENA, MD  Subjective: No acute events overnight. Seen and examined at bedside. Reports slight improvement in bilateral leg edema. Reports feeling weak and tired today. Tolerating oral intake without n/v. Denies constipation.   Hospital Course:  As per H&P: 68 y.o. male with history of chronic HFrEF last EF measured on September 2025 was 35 to 40%, has had cardiac cath in October 2025 which showedDist LAD lesion is 80% stenosed,Prox RCA lesion is 100% stenosed, Prox LAD to Mid LAD lesion is 70% stenose, diabetes mellitus type 2, chronic disease stage III, gout, sleep apnea, history of prostate cancer, history of stroke presents to the ER because of increasing shortness of breath with exertion over the last 2 to 3 days.  Patient states he also has been having some orthopnea and has noticed some weight gain and lower extremity edema more than usual.  Over the last one week patient has been having productive cough denies any fever or chills.  He has been taking Lasix  despite which he was getting more short of breath and decided to come to the ER.  He also has been having chest tightness on exertion last few days.   Assessment and Plan:  Acute on chronic HFrEF  - last EF measured in September 2025 was 35 to 40%.  - TTE 12/25 showed LVEF < 20%, global hypokinesis, dilated LV, mild concentric LVH, RV size/systolic function normal, moderately dilated LA, mild to moderate MR, dilated IVC with <50% respiratory variability   - Patient takes Lasix  20 mg at home.  - cont lasix  40 mg IV BID - Continue metoprolol , jardiance  and spironolactone . - hold home Entresto  given AKI - monitor I/Os, daily weights, renal function, electrolytes - monitor on tele - cardiology following   Elevated troponin NSTEMI  - concern for non-ST elevation MI given the recent cardiac cath showing severe disease that was not  stented with plan to manage medically (Dist LAD lesion is 80% stenosed, prox RCA lesion is 100% stenosed, prox LAD to Mid LAD lesion is 70% stenoses) - TTE showed LVEF < 20%, global hypokinesis, dilated LV, mild concentric LVH, RV size/systolic function normal, moderately dilated LA, mild to moderate MR, dilated IVC with <50% respiratory variability - as per patient, history of myalgias with statins - home repatha  on hold as non-formulary - cont heparin  infusion - continue with aspirin  and metoprolol  - may need repeat cardiac cath once more optimized in terms of fluid status and renal function - monitor on tele - cardiology following   Acute on chronic kidney disease stage III  - creatinine 1.8 < 2.04, 2.06 on admission, baseline around 1.7 - Possible cardiorenal syndrome.  - IV diuresis as elsewhere.  - hold home entresto  - monitor I/Os, renal function    Presumed pneumonia  - patient presented with productive cough  - CXR showed bilateral pleural effusion with associated atelectasis vs PNA - procalcitonin not elevated - stopped empiric antibiotics  - monitor clinically   Diabetes mellitus type 2  - HGbA1c 6.8 - takes Lantus .  - hold home Mounjaro    - cont Jardiance   - continue home dose of Lantus  with sliding scale coverage.    Anemia  - no signs/symptoms of acute bleed - Follow CBC  - will check iron  studies    History of gout on allopurinol .   Prior history of stroke on aspirin  and Repatha .   Sleep  apnea CPAP at bedtime.   DVT prophylaxis:   IV Heparin    Code Status: Full Code  Disposition Plan: TBD pending clinical course Reason for continuing need for hospitalization: severity of illness, IV diuresis, cardiology recommendations  Objective: Vitals:   05/14/24 0500 05/14/24 0551 05/14/24 0718 05/14/24 1117  BP:  (!) 142/76 (!) 150/82 (!) 143/87  Pulse:   86 92  Resp:  20 20 16   Temp:  97.6 F (36.4 C) (!) 97.4 F (36.3 C) 98.9 F (37.2 C)  TempSrc:   Oral Oral Oral  SpO2:   96% 99%  Weight: 109.2 kg     Height:        Intake/Output Summary (Last 24 hours) at 05/14/2024 1210 Last data filed at 05/14/2024 0944 Gross per 24 hour  Intake 637.25 ml  Output 3150 ml  Net -2512.75 ml   Filed Weights   05/13/24 0020 05/13/24 1300 05/14/24 0500  Weight: 109.3 kg 108.2 kg 109.2 kg    Examination:  Physical Exam Vitals and nursing note reviewed.  Constitutional:      General: He is not in acute distress.    Appearance: He is ill-appearing.  HENT:     Head: Normocephalic and atraumatic.  Cardiovascular:     Rate and Rhythm: Normal rate and regular rhythm.     Pulses: Normal pulses.     Heart sounds: Normal heart sounds.  Pulmonary:     Effort: Pulmonary effort is normal.     Breath sounds: Normal breath sounds.  Abdominal:     General: Bowel sounds are normal.     Palpations: Abdomen is soft.  Musculoskeletal:     Right lower leg: Edema present.     Left lower leg: Edema present.  Neurological:     Mental Status: He is alert. Mental status is at baseline.     Data Reviewed: I have personally reviewed following labs and imaging studies  CBC: Recent Labs  Lab 05/13/24 0051 05/13/24 0600 05/14/24 0328  WBC 11.8* 11.0* 8.5  NEUTROABS  --  9.0*  --   HGB 11.7* 12.5* 11.4*  HCT 36.8* 40.0 35.5*  MCV 82.3 84.0 80.9  PLT 198 206 191   Basic Metabolic Panel: Recent Labs  Lab 05/13/24 0051 05/13/24 0600 05/14/24 0738  NA 141 142 139  K 3.7 3.8 3.5  CL 107 107 105  CO2 23 23 23   GLUCOSE 200* 110* 98  BUN 27* 27* 29*  CREATININE 2.06* 2.04* 1.81*  CALCIUM  8.0* 8.4* 8.2*  MG  --  2.1 2.1   GFR: Estimated Creatinine Clearance: 45.3 mL/min (A) (by C-G formula based on SCr of 1.81 mg/dL (H)). Liver Function Tests: Recent Labs  Lab 05/13/24 0600  AST 93*  ALT 23  ALKPHOS 104  BILITOT 0.4  PROT 6.4*  ALBUMIN 3.2*   No results for input(s): LIPASE, AMYLASE in the last 168 hours. No results for  input(s): AMMONIA in the last 168 hours. Coagulation Profile: No results for input(s): INR, PROTIME in the last 168 hours. Cardiac Enzymes: No results for input(s): CKTOTAL, CKMB, CKMBINDEX, TROPONINI in the last 168 hours. ProBNP, BNP (last 5 results) Recent Labs    01/13/24 1208 01/30/24 1340 05/13/24 0051  PROBNP  --   --  8,752.0*  BNP 170.3* 119.0*  --    HbA1C: Recent Labs    05/13/24 0600  HGBA1C 6.8*   CBG: Recent Labs  Lab 05/13/24 2118 05/14/24 0554 05/14/24 0600 05/14/24 0631 05/14/24 1133  GLUCAP  111* 52* 51* 109* 112*   Lipid Profile: Recent Labs    05/13/24 0600  CHOL 216*  HDL 46  LDLCALC 161*  TRIG 45  CHOLHDL 4.7   Thyroid Function Tests: Recent Labs    05/13/24 0600  TSH 0.769   Anemia Panel: Recent Labs    05/13/24 1311  FERRITIN 740*  TIBC 200*  IRON  20*   Sepsis Labs: Recent Labs  Lab 05/13/24 0600  PROCALCITON 0.60    Recent Results (from the past 240 hours)  Resp panel by RT-PCR (RSV, Flu A&B, Covid) Anterior Nasal Swab     Status: None   Collection Time: 05/13/24  3:33 AM   Specimen: Anterior Nasal Swab  Result Value Ref Range Status   SARS Coronavirus 2 by RT PCR NEGATIVE NEGATIVE Final   Influenza A by PCR NEGATIVE NEGATIVE Final   Influenza B by PCR NEGATIVE NEGATIVE Final    Comment: (NOTE) The Xpert Xpress SARS-CoV-2/FLU/RSV plus assay is intended as an aid in the diagnosis of influenza from Nasopharyngeal swab specimens and should not be used as a sole basis for treatment. Nasal washings and aspirates are unacceptable for Xpert Xpress SARS-CoV-2/FLU/RSV testing.  Fact Sheet for Patients: bloggercourse.com  Fact Sheet for Healthcare Providers: seriousbroker.it  This test is not yet approved or cleared by the [place]  FDA and has been authorized for detection and/or diagnosis of SARS-CoV-2 by FDA under an Emergency Use Authorization (EUA).  This EUA will remain in effect (meaning this test can be used) for the duration of the COVID-19 declaration under Section 564(b)(1) of the Act, 21 U.S.C. section 360bbb-3(b)(1), unless the authorization is terminated or revoked.     Resp Syncytial Virus by PCR NEGATIVE NEGATIVE Final    Comment: (NOTE) Fact Sheet for Patients: bloggercourse.com  Fact Sheet for Healthcare Providers: seriousbroker.it  This test is not yet approved or cleared by the [place]  FDA and has been authorized for detection and/or diagnosis of SARS-CoV-2 by FDA under an Emergency Use Authorization (EUA). This EUA will remain in effect (meaning this test can be used) for the duration of the COVID-19 declaration under Section 564(b)(1) of the Act, 21 U.S.C. section 360bbb-3(b)(1), unless the authorization is terminated or revoked.  Performed at [facility], [address], [place], [place] [address]      Radiology Studies: ECHOCARDIOGRAM COMPLETE Result Date: 05/13/2024    ECHOCARDIOGRAM REPORT   Patient Name:   BELEN ZWAHLEN Date of Exam: 05/13/2024 Medical Rec #:  [id number]       Height:       66.0 in Accession #:    [id number]      Weight:       238.5 lb Date of Birth:  05/30/1955       BSA:          2.155 m Patient Age:    68 years        BP:           134/76 mmHg Patient Gender: M               HR:           103 bpm. Exam Location:  Inpatient Procedure: 2D Echo, Cardiac Doppler, Color Doppler and Intracardiac            Opacification Agent (Both Spectral and Color Flow Doppler were            utilized during procedure). Indications:    Dyspnea R06  History:        Patient has prior history of Echocardiogram examinations, most                 recent 02/11/2024. Signs/Symptoms:Dyspnea.  Sonographer:    Merlynn Argyle Referring Phys: [id number] GILLIAN HERO [facility]  Sonographer Comments: Image acquisition challenging due to respiratory motion and Image acquisition  challenging due to patient body habitus. IMPRESSIONS  1. Left ventricular ejection fraction, by estimation, is <20%. The left ventricle has severely decreased function. The left ventricle demonstrates global hypokinesis. The left ventricular internal cavity size was moderately dilated. There is mild concentric left ventricular hypertrophy. Left ventricular diastolic parameters are indeterminate.  2. Right ventricular systolic function is normal. The right ventricular size is normal. Tricuspid regurgitation signal is inadequate for assessing PA pressure.  3. Left atrial size was moderately dilated.  4. Large pleural effusion in the left lateral region.  5. The mitral valve is normal in structure. Mild to moderate mitral valve regurgitation. No evidence of mitral stenosis.  6. The aortic valve is tricuspid. There is mild calcification of the aortic valve. Aortic valve regurgitation is trivial. Aortic valve sclerosis/calcification is present, without any evidence of aortic stenosis.  7. The inferior vena cava is dilated in size with <50% respiratory variability, suggesting right atrial pressure of 15 mmHg. FINDINGS  Left Ventricle: Left ventricular ejection fraction, by estimation, is <20%. The left ventricle has severely decreased function. The left ventricle demonstrates global hypokinesis. Definity  contrast agent was given IV to delineate the left ventricular endocardial borders. The left ventricular internal cavity size was moderately dilated. There is mild concentric left ventricular hypertrophy. Left ventricular diastolic parameters are indeterminate. Right Ventricle: The right ventricular size is normal. No increase in right ventricular wall thickness. Right ventricular systolic function is normal. Tricuspid regurgitation signal is inadequate for assessing PA pressure. Left Atrium: Left atrial size was moderately dilated. Right Atrium: Right atrial size was normal in size. Pericardium: There is no evidence of  pericardial effusion. Mitral Valve: The mitral valve is normal in structure. Mild to moderate mitral valve regurgitation. No evidence of mitral valve stenosis. Tricuspid Valve: The tricuspid valve is normal in structure. Tricuspid valve regurgitation is trivial. No evidence of tricuspid stenosis. Aortic Valve: The aortic valve is tricuspid. There is mild calcification of the aortic valve. Aortic valve regurgitation is trivial. Aortic valve sclerosis/calcification is present, without any evidence of aortic stenosis. Pulmonic Valve: The pulmonic valve was normal in structure. Pulmonic valve regurgitation is not visualized. No evidence of pulmonic stenosis. Aorta: The aortic root is normal in size and structure. Venous: The inferior vena cava is dilated in size with less than 50% respiratory variability, suggesting right atrial pressure of 15 mmHg. IAS/Shunts: No atrial level shunt detected by color flow Doppler. Additional Comments: There is a large pleural effusion in the left lateral region.  LEFT VENTRICLE PLAX 2D LVIDd:         6.00 cm      Diastology LVIDs:         5.10 cm      LV e' medial:    7.29 cm/s LV PW:         1.10 cm      LV E/e' medial:  13.4 LV IVS:        1.00 cm      LV e' lateral:   10.30 cm/s LVOT diam:     2.10 cm      LV E/e' lateral: 9.5 LV SV:  46 LV SV Index:   22 LVOT Area:     3.46 cm LV IVRT:       77 msec  LV Volumes (MOD) LV vol d, MOD A2C: 132.5 ml LV vol d, MOD A4C: 188.0 ml LV vol s, MOD A2C: 97.3 ml LV vol s, MOD A4C: 132.0 ml LV SV MOD A2C:     35.2 ml LV SV MOD A4C:     188.0 ml LV SV MOD BP:      41.2 ml RIGHT VENTRICLE            IVC RV Basal diam:  3.10 cm    IVC diam: 2.20 cm RV S prime:     6.64 cm/s TAPSE (M-mode): 1.7 cm LEFT ATRIUM              Index        RIGHT ATRIUM           Index LA diam:        4.80 cm  2.23 cm/m   RA Area:     13.60 cm LA Vol (A2C):   63.0 ml  29.23 ml/m  RA Volume:   36.60 ml  16.98 ml/m LA Vol (A4C):   103.0 ml 47.79 ml/m LA Biplane  Vol: 81.1 ml  37.63 ml/m  AORTIC VALVE LVOT Vmax:   76.60 cm/s LVOT Vmean:  61.900 cm/s LVOT VTI:    0.134 m  AORTA Ao Root diam: 3.50 cm Ao Asc diam:  3.30 cm MITRAL VALVE MV Area (PHT): 6.48 cm    SHUNTS MV Decel Time: 117 msec    Systemic VTI:  0.13 m MV E velocity: 97.50 cm/s  Systemic Diam: 2.10 cm MV A velocity: 61.80 cm/s MV E/A ratio:  1.58 Toribio Fuel MD Electronically signed by Toribio Fuel MD Signature Date/Time: 05/13/2024/5:06:04 PM    Final    DG Chest Port 1 View Result Date: 05/13/2024 EXAM: 1 VIEW(S) XRAY OF THE CHEST 05/13/2024 12:52:48 AM COMPARISON: None available. CLINICAL HISTORY: sob FINDINGS: LUNGS AND PLEURA: Low lung volumes. Layering bilateral pleural effusions. Bibasilar airspace opacities. No pneumothorax. HEART AND MEDIASTINUM: Cardiomegaly. BONES AND SOFT TISSUES: No acute osseous abnormality. IMPRESSION: 1. Layering bilateral pleural effusions with bibasilar atelectasis or pneumonia. Electronically signed by: Norman Gatlin MD 05/13/2024 01:02 AM EST RP Workstation: [id number]    Scheduled Meds:  allopurinol   100 mg Oral Daily   amiodarone   400 mg Oral BID   aspirin  EC  81 mg Oral Daily   empagliflozin   10 mg Oral Daily   furosemide   40 mg Intravenous BID   insulin  aspart  0-9 Units Subcutaneous TID WC   spironolactone   25 mg Oral Daily   tamsulosin   0.4 mg Oral Daily   Continuous Infusions:  heparin  1,450 Units/hr (05/13/24 1940)   iron  sucrose 200 mg (05/14/24 1103)     LOS: 1 day   Norval Bar, MD  [facility]  05/14/2024, 12:10 PM   "

## 2024-05-14 NOTE — Plan of Care (Signed)

## 2024-05-14 NOTE — Progress Notes (Signed)
 PHARMACY - ANTICOAGULATION CONSULT NOTE  Pharmacy Consult for Heparin  Indication: chest pain/ACS  Allergies[1]  Patient Measurements: Height: 5' 6 (167.6 cm) Weight: 109.2 kg (240 lb 11.9 oz) IBW/kg (Calculated) : 63.8 HEPARIN  DW (KG): 88.3  Vital Signs: Temp: 97.4 F (36.3 C) (12/26 0718) Temp Source: Oral (12/26 0718) BP: 150/82 (12/26 0718) Pulse Rate: 86 (12/26 0718)  Labs: Recent Labs    05/13/24 0051 05/13/24 0600 05/13/24 1311 05/14/24 0328 05/14/24 0738  HGB 11.7* 12.5*  --  11.4*  --   HCT 36.8* 40.0  --  35.5*  --   PLT 198 206  --  191  --   HEPARINUNFRC  --   --  0.20* 0.59  --   CREATININE 2.06* 2.04*  --   --  1.81*    Estimated Creatinine Clearance: 45.3 mL/min (A) (by C-G formula based on SCr of 1.81 mg/dL (H)).   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 male presenting with CHF and elevated troponin. He reports worsening dyspnea on exertion, PND, orthopnea, and leg swelling but no chest pain. Pharmacy is being consulted to dose heparin  for ACS rule-out. Patient is not on anticoagulation prior to admission.  Heparin  level 0.59 is therapeutic with  heparin  running at 1450 units/hr. Hgb (11.4) and PLTs (191) are trending down slightly. Patient renal function is stable. Per RN, no report of pauses, issues with the line, or signs of bleeding.   Goal of Therapy:  Heparin  level 0.3-0.7 units/ml Monitor platelets by anticoagulation protocol: Yes   Plan:  Continue heparin  at 1450 units/hr  -Monitor daily heparin  levels and CBC -Monitor for any signs/symptoms of bleeding  Thank you for allowing pharmacy to be involved with this patient's care.  Donny Alert, PharmD, FCCM Clinical Pharmacist Please see AMION for all Pharmacists' Contact Phone Numbers 05/14/2024, 8:36 AM       [1]  Allergies Allergen Reactions   Erythromycin Nausea And Vomiting   Nsaids Other (See Comments)    Contraindication due to CKD    Onion Nausea And Vomiting  [2]  Current Facility-Administered Medications on File Prior to Encounter  Medication Dose Route Frequency Provider Last Rate Last Admin   sodium phosphate  (FLEET) 7-19 GM/118ML enema 1 enema  1 enema Rectal Once Cam Morene ORN, MD       Current Outpatient Medications on File Prior to Encounter  Medication Sig Dispense Refill   acetaminophen  (TYLENOL ) 325 MG tablet Take 1-2 tablets (325-650 mg total) by mouth every 4 (four) hours as needed for mild pain.  albuterol  (VENTOLIN  HFA) 108 (90 Base) MCG/ACT inhaler Inhale 1-2 puffs into the lungs every 6 (six) hours as needed. (Patient taking differently: Inhale 1-2 puffs into the lungs every 6 (six) hours as needed for wheezing or shortness of breath.) 9 g 3   allopurinol  (ZYLOPRIM ) 100 MG tablet Take 1 tablet (100 mg total) by mouth daily. (Patient taking differently: Take 50 mg by mouth 2 (two) times daily.) 90 tablet 2   ascorbic acid  (VITAMIN C ) 500 MG tablet Take 1 tablet (500 mg total) by mouth daily. 100 tablet 0   aspirin  EC 81 MG tablet Take 1 tablet (81 mg total) by mouth daily. 90 tablet 0   empagliflozin  (JARDIANCE ) 25 MG TABS tablet  Take 1 tablet (25 mg total) by mouth in the morning. (Patient taking differently: Take 12.5 mg by mouth in the morning and at bedtime.) 90 tablet 3   Evolocumab  (REPATHA  SURECLICK) 140 MG/ML SOAJ Inject 140 mg into the skin every 14 (fourteen) days. Please keep your upcoming appointment for refills. 6 mL 3   furosemide  (LASIX ) 20 MG tablet Take 1 tablet (20 mg total) by mouth daily. May take an additional tablet as needed for weight gain of 2 lbs overnight or 5 lbs in one week. 135 tablet 2   insulin  glargine (LANTUS  SOLOSTAR) 100 UNIT/ML Solostar Pen Inject 16 Units into the skin every morning AND 20 Units at bedtime. 12 mL 3   metoprolol  succinate (TOPROL -XL) 50 MG 24 hr tablet Take 1 tablet (50 mg total) by mouth at bedtime. Take with or immediately following a meal. 90 tablet 2   sacubitril -valsartan  (ENTRESTO ) 49-51 MG Take 1 tablet by mouth 2 (two) times daily. 180 tablet 3   spironolactone  (ALDACTONE ) 25 MG tablet Take 1 tablet (25 mg total) by mouth daily. (Patient taking differently: Take 12.5 mg by mouth 2 (two) times daily.) 30 tablet 5   tirzepatide  (MOUNJARO ) 2.5 MG/0.5ML Pen Inject 2.5 mg into the skin once a week. 6 mL 3   vitamin D3 (CHOLECALCIFEROL ) 25 MCG tablet Take 1 tablet (1,000 Units total) by mouth daily. 100 tablet 0   Accu-Chek Softclix Lancets lancets Use to test Blood Sugars daily. 100 each 3   Continuous Glucose Sensor (FREESTYLE LIBRE 3 SENSOR) MISC Use as directed for blood sugar monitoring at least three times daily and as needed (Patient not taking: Reported on 05/13/2024) 6 each 3   glucose blood (ACCU-CHEK GUIDE TEST) test strip Use to test Blood Sugars once daily 300 strip 3   tamsulosin  (FLOMAX ) 0.4 MG CAPS capsule Take 1 capsule (0.4 mg total) by mouth daily. (Patient not taking: Reported on 05/13/2024) 30 capsule 11

## 2024-05-14 NOTE — TOC CM/SW Note (Signed)
 Transition of Care [facility]) - Inpatient Brief Assessment   Patient Details  Name: JORAH HUA MRN: [id number] Date of Birth: 12-29-1955  [facility] of [facility]) CM/SW Contact:    Waddell Barnie Rama, RN Phone Number: 05/14/2024, 1:16 PM   Clinical Narrative: From home alone, has PCP and insurance on file, states has no HH services in place at this time, has a cane at home.  States a friend will transport them home at [employer] and family is support system, states gets medications from [facility].  Pta self ambulatory.   Patient states he is afraid to go home alone because of his sob,  he will be going to a friends house [place].  Doctor states will need to check ambulatory sats closer to [place] which will be next week.    Transition of Care Asessment: Insurance and Status: Insurance coverage has been reviewed Patient has primary care physician: Yes Home environment has been reviewed: home alone Prior level of function:: uses cane periodically Prior/Current Home Services: Current home services (cane) Social Drivers of Health Review: SDOH reviewed no interventions necessary Readmission risk has been reviewed: Yes Transition of care needs: transition of care needs identified, TOC will continue to follow

## 2024-05-14 NOTE — Progress Notes (Signed)
 "   DAILY PROGRESS NOTE   Patient Name: Thomas Gardner Date of Encounter: 05/14/2024 Cardiologist: Annabella Scarce, MD  Chief Complaint   Short of breath  Patient Profile   Thomas Gardner is a 68 y.o. 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   Subjective   Diuresed about 1.1L negative overnight.  Creatinine improved today, but remains volume overloaded.  Potassium 3.5 today. Had quite a few PVC's and PSVT overnight. Notes persistent DOE - repeat echo yesterday shows further decline in LVEF to 20% (From 35-40% in 01/2024). Recent LHC showed 80% distal LAD stenosis and 100% RCA occlusion with 70% prox-mid-LAD stenosis, heavily calcified, no intervention performed. No anginal complaints.  Objective   Vitals:   05/13/24 2326 05/14/24 0500 05/14/24 0551 05/14/24 0718  BP: (!) 147/99  (!) 142/76 (!) 150/82  Pulse: (!) 105   86  Resp: 20  20 20   Temp: 98.9 F (37.2 C)  97.6 F (36.4 C) (!) 97.4 F (36.3 C)  TempSrc: Oral  Oral Oral  SpO2:    96%  Weight:  109.2 kg    Height:        Intake/Output Summary (Last 24 hours) at 05/14/2024 0959 Last data filed at 05/14/2024 0944 Gross per 24 hour  Intake 637.25 ml  Output 2150 ml  Net -1512.75 ml   Filed Weights   05/13/24 0020 05/13/24 1300 05/14/24 0500  Weight: 109.3 kg 108.2 kg 109.2 kg    Physical Exam   General appearance: alert and mild distress Neck: JVD - 5 cm above sternal notch, no adenopathy, no carotid bruit, supple, symmetrical, trachea midline, and thyroid not enlarged, symmetric, no tenderness/mass/nodules Lungs: diminished breath sounds  bilaterally Heart: regular rate and rhythm Abdomen: soft, non-tender; bowel sounds normal; no masses,  no organomegaly and obese Extremities: edema 2+  bilateral pitting LE Pulses: 2+ and symmetric Skin: Skin color, texture, turgor normal. No rashes or lesions Neurologic: Grossly normal Psych: Pleasant, appears dyspneic  Inpatient Medications    Scheduled Meds:  allopurinol   100 mg Oral Daily   aspirin  EC  81 mg Oral Daily   empagliflozin   10 mg Oral Daily   furosemide   40 mg Intravenous BID   insulin  aspart  0-9 Units Subcutaneous TID WC   insulin  glargine  18 Units Subcutaneous BID   metoprolol  succinate  50 mg Oral QHS   spironolactone   25 mg Oral Daily   tamsulosin   0.4 mg Oral Daily    Continuous Infusions:  heparin  1,450 Units/hr (05/13/24 1940)   iron  sucrose      PRN Meds: acetaminophen  **OR** acetaminophen    Labs   Results for orders placed or performed during the hospital encounter of 05/13/24 (from the past 48 hours)  CBC     Status: Abnormal   Collection Time: 05/13/24 12:51 AM  Result Value Ref Range   WBC 11.8 (H) 4.0 - 10.5 K/uL   RBC 4.47 4.22 - 5.81 MIL/uL   Hemoglobin 11.7 (L) 13.0 - 17.0 g/dL   HCT 63.1 (L) 60.9 - 47.9 %   MCV 82.3 80.0 - 100.0 fL   MCH 26.2 26.0 - 34.0 pg   MCHC  31.8 30.0 - 36.0 g/dL   RDW 84.4 88.4 - 84.4 %   Platelets 198 150 - 400 K/uL   nRBC 0.0 0.0 - 0.2 %    Comment: Performed at [facility], [address] [place], [place] [address]  Basic metabolic panel with GFR     Status: Abnormal   Collection Time: 05/13/24 12:51 AM  Result Value Ref Range   Sodium 141 135 - 145 mmol/L    Comment: Electrolytes repeated to verify    Potassium 3.7 3.5 - 5.1 mmol/L    Comment: HEMOLYSIS AT THIS LEVEL MAY AFFECT RESULT   Chloride 107 98 - 111 mmol/L   CO2 23 22 - 32 mmol/L   Glucose, Bld 200 (H) 70 - 99 mg/dL    Comment: Glucose reference range applies only to samples taken after fasting for at least 8 hours.   BUN 27 (H)  8 - 23 mg/dL   Creatinine, Ser 7.93 (H) 0.61 - 1.24 mg/dL   Calcium  8.0 (L) 8.9 - 10.3 mg/dL   GFR, Estimated 34 (L) >60 mL/min    Comment: (NOTE) Calculated using the CKD-EPI Creatinine Equation (2021)    Anion gap 12 5 - 15    Comment: Performed at [facility], [address], [place], [place] [address]  Troponin T, High Sensitivity     Status: Abnormal   Collection Time: 05/13/24 12:51 AM  Result Value Ref Range   Troponin T High Sensitivity 145 (HH) 0 - 19 ng/L    Comment: Critical Value, Read Back and verified with Morrsion, T. RN at [phone] 05/13/24 SatrainR (NOTE) Biotin concentrations > 1000 ng/mL falsely decrease TnT results.  Serial cardiac troponin measurements are suggested.  Refer to the Links section for chest pain algorithms and additional  guidance. Performed at [facility], [address] [place], [place] [address]   Pro Brain natriuretic peptide     Status: Abnormal   Collection Time: 05/13/24 12:51 AM  Result Value Ref Range   Pro Brain Natriuretic Peptide 8,752.0 (H) <300.0 pg/mL    Comment: (NOTE) Age Group        Cut-Points    Interpretation  < 50 years     450 pg/mL       NT-proBNP > 450 pg/mL indicates                                ADHF is likely              50 to 75 years  900 pg/mL      NT-proBNP > 900 pg/mL indicates          ADHF is likely  > 75 years      1800 pg/mL     NT-proBNP > 1800 pg/mL indicates          ADHF is likely                           All ages    Results between       Indeterminate. Further clinical             300 and the cut-   information is needed to determine            point for age group   if ADHF is present.  Elecsys proBNP II/ Elecsys proBNP II STAT           Cut-Point                       Interpretation  300 pg/mL                    NT-proBNP <300pg/mL indicates                             ADHF is not likely  Performed at [facility], [address] [place], [place] [address]   Resp panel by RT-PCR (RSV, Flu A&B, Covid) Anterior Nasal Swab     Status: None   Collection Time: 05/13/24  3:33 AM   Specimen: Anterior Nasal Swab  Result Value Ref Range   SARS Coronavirus 2 by RT PCR NEGATIVE NEGATIVE   Influenza A by PCR NEGATIVE NEGATIVE   Influenza B by PCR NEGATIVE NEGATIVE    Comment: (NOTE) The Xpert Xpress SARS-CoV-2/FLU/RSV plus assay is intended as an aid in the diagnosis of influenza from Nasopharyngeal swab specimens and should not be used as a sole basis for treatment. Nasal washings and aspirates are unacceptable for Xpert Xpress SARS-CoV-2/FLU/RSV testing.  Fact Sheet for Patients: bloggercourse.com  Fact Sheet for Healthcare Providers: seriousbroker.it  This test is not yet approved or cleared by the [place]  FDA and has been authorized for detection and/or diagnosis of SARS-CoV-2 by FDA under an Emergency Use Authorization (EUA). This EUA will remain in effect (meaning this test can be used) for the duration of the COVID-19 declaration under Section 564(b)(1) of the Act, 21 U.S.C. section 360bbb-3(b)(1), unless the authorization is terminated or revoked.     Resp Syncytial Virus by PCR NEGATIVE NEGATIVE    Comment: (NOTE) Fact Sheet for Patients: bloggercourse.com  Fact Sheet for Healthcare Providers: seriousbroker.it  This test is not yet approved or cleared by the [place]  FDA and has been authorized for detection and/or diagnosis of SARS-CoV-2 by FDA under an Emergency Use Authorization (EUA). This EUA will remain in effect (meaning this test can be used) for the duration of the COVID-19 declaration under Section 564(b)(1) of the Act, 21 U.S.C. section 360bbb-3(b)(1), unless the authorization is terminated or revoked.  Performed at [facility], [address] [place], [place] [address]   Troponin T, High Sensitivity     Status: Abnormal   Collection Time: 05/13/24  3:33 AM  Result Value Ref Range   Troponin T High Sensitivity 482 (HH) 0 - 19 ng/L    Comment: Critical value noted. Value is consistent with previously reported and called value  (NOTE) Biotin concentrations > 1000 ng/mL falsely decrease TnT results.  Serial cardiac troponin measurements are suggested.  Refer to the Links section for chest pain algorithms and additional  guidance. Performed at [facility], [address], [place], [place] [address]   Hemoglobin A1c     Status: Abnormal   Collection Time: 05/13/24  6:00 AM  Result Value Ref Range   Hgb A1c MFr Bld 6.8 (H) 4.8 - 5.6 %    Comment: (NOTE) Diagnosis of Diabetes The following HbA1c ranges recommended by the American Diabetes Association (ADA) may be used as an aid in the diagnosis of diabetes mellitus.  Hemoglobin             Suggested A1C NGSP%  Diagnosis  <5.7                   Non Diabetic  5.7-6.4                Pre-Diabetic  >6.4                   Diabetic  <7.0                   Glycemic control for                       adults with diabetes.     Mean Plasma Glucose 148.46 mg/dL    Comment: Performed at [facility], [address], [place], [place] [address]  HIV Antibody (routine testing w rflx)     Status: None   Collection Time: 05/13/24  6:00 AM  Result Value Ref Range   HIV Screen 4th Generation wRfx Non Reactive Non Reactive    Comment: Performed at [facility], [address] [place], [place] [address]  Basic metabolic panel     Status: Abnormal   Collection Time: 05/13/24  6:00 AM  Result Value Ref Range   Sodium 142 135 - 145 mmol/L   Potassium 3.8 3.5 - 5.1 mmol/L   Chloride 107 98 - 111 mmol/L   CO2 23 22 - 32 mmol/L   Glucose, Bld 110 (H) 70 - 99 mg/dL    Comment: Glucose reference range applies only to samples taken after fasting for at least  8 hours.   BUN 27 (H) 8 - 23 mg/dL   Creatinine, Ser 7.95 (H) 0.61 - 1.24 mg/dL   Calcium  8.4 (L) 8.9 - 10.3 mg/dL   GFR, Estimated 35 (L) >60 mL/min    Comment: (NOTE) Calculated using the CKD-EPI Creatinine Equation (2021)    Anion gap 12 5 - 15    Comment: Performed at [facility], [address], [place], [place] [address]  Hepatic function panel     Status: Abnormal   Collection Time: 05/13/24  6:00 AM  Result Value Ref Range   Total Protein 6.4 (L) 6.5 - 8.1 g/dL   Albumin 3.2 (L) 3.5 - 5.0 g/dL   AST 93 (H) 15 - 41 U/L   ALT 23 0 - 44 U/L   Alkaline Phosphatase 104 38 - 126 U/L   Total Bilirubin 0.4 0.0 - 1.2 mg/dL   Bilirubin, Direct 0.1 0.0 - 0.2 mg/dL   Indirect Bilirubin 0.2 (L) 0.3 - 0.9 mg/dL    Comment: Performed at [facility], [address] [place], [place] [address]  Magnesium     Status: None   Collection Time: 05/13/24  6:00 AM  Result Value Ref Range   Magnesium 2.1 1.7 - 2.4 mg/dL    Comment: Performed at [facility], [address] [place], [place] [address]  CBC with Differential/Platelet     Status: Abnormal   Collection Time: 05/13/24  6:00 AM  Result Value Ref Range   WBC 11.0 (H) 4.0 - 10.5 K/uL   RBC 4.76 4.22 - 5.81 MIL/uL   Hemoglobin 12.5 (L) 13.0 - 17.0 g/dL   HCT 59.9 60.9 - 47.9 %   MCV 84.0 80.0 - 100.0 fL   MCH 26.3 26.0 - 34.0 pg   MCHC 31.3 30.0 - 36.0 g/dL   RDW 84.2 (H) 88.4 - 84.4 %  Platelets 206 150 - 400 K/uL   nRBC 0.0 0.0 - 0.2 %   Neutrophils Relative % 81 %   Neutro Abs 9.0 (H) 1.7 - 7.7 K/uL   Lymphocytes Relative 9 %   Lymphs Abs 1.0 0.7 - 4.0 K/uL   Monocytes Relative 9 %   Monocytes Absolute 1.0 0.1 - 1.0 K/uL   Eosinophils Relative 0 %   Eosinophils Absolute 0.0 0.0 - 0.5 K/uL   Basophils Relative 0 %   Basophils Absolute 0.0 0.0 - 0.1 K/uL   Immature Granulocytes 1 %   Abs Immature Granulocytes 0.08 (H) 0.00 - 0.07 K/uL    Comment: Performed at [facility], [address],  [place], [place] [address]  TSH     Status: None   Collection Time: 05/13/24  6:00 AM  Result Value Ref Range   TSH 0.769 0.350 - 4.500 uIU/mL    Comment: Performed at [facility], [address] [place], [place] [address]  Procalcitonin     Status: None   Collection Time: 05/13/24  6:00 AM  Result Value Ref Range   Procalcitonin 0.60 ng/mL    Comment: (NOTE)   Sepsis PCT Algorithm          Lower Respiratory Tract Infection                                         PCT Algorithm -----------------------------------------------------------------  <0.5 ng/mL                    <0.10 ng/mL  Associated with low           Antibiotic therapy strongly   risk for progression          discouraged. Indicates absence   to severe sepsis              of bacteria infection  and/or septic shock             --------------------------------------------------------------  0.5-2.0 ng/mL                 0.10-0.25 ng/mL  Recommended to retest         Antibiotic therapy discouraged.  PCT within 6-24 hours         Bacterial infection unlikely  ------------------------------------------------------------  >2 ng/mL                      0.26-0.50 ng/mL  Associated with high risk     Antibiotic therapy encouraged.  for progression to severe     Bacterial infection possible  sepsis/and or septic shock    ------------------------------                                 >0.50 ng/mL                                Antibiotic therapy strongly                                 encouraged.  Suggestive of presence of                                 bacterial infection.                                 -------------------------------------------------------------------  < or = 0.50 ng/mL OR          < or = 0.25 OR 80% decrease in PCT  80% decrease in PCT           Antibiotic therapy   Antibiotic therapy may        may be discontinued  be discontinued                                  Performed at [facility], [address] [place], [place] [address]   Lipid panel     Status: Abnormal   Collection Time: 05/13/24  6:00 AM  Result Value Ref Range   Cholesterol 216 (H) 0 - 200 mg/dL    Comment:        ATP III CLASSIFICATION:  <200     mg/dL   Desirable  799-760  mg/dL   Borderline High  >=759    mg/dL   High           Triglycerides 45 <150 mg/dL   HDL 46 >59 mg/dL   Total CHOL/HDL Ratio 4.7 RATIO   VLDL 9 0 - 40 mg/dL   LDL Cholesterol 838 (H) 0 - 99 mg/dL    Comment:        Total Cholesterol/HDL:CHD Risk Coronary Heart Disease Risk Table                     Men   Women  1/2 Average Risk   3.4   3.3  Average Risk       5.0   4.4  2 X Average Risk   9.6   7.1  3 X Average Risk  23.4   11.0        Use the calculated Patient Ratio above and the CHD Risk Table to determine the patient's CHD Risk.        ATP III CLASSIFICATION (LDL):  <100     mg/dL   Optimal  899-870  mg/dL   Near or Above                    Optimal  130-159  mg/dL   Borderline  839-810  mg/dL   High  >809     mg/dL   Very High Performed at [facility], [address], [place], [place] [address]   CBG monitoring, ED     Status: None   Collection Time: 05/13/24  7:29 AM  Result Value Ref Range   Glucose-Capillary 93 70 - 99 mg/dL    Comment: Glucose reference range applies only to samples taken after fasting for at least 8 hours.  CBG monitoring, ED     Status: None   Collection Time: 05/13/24 12:00 PM  Result Value Ref Range   Glucose-Capillary 90 70 - 99 mg/dL    Comment: Glucose reference range applies only to samples taken after fasting for at least 8 hours.  Heparin  level (unfractionated)  Status: Abnormal   Collection Time: 05/13/24  1:11 PM  Result Value Ref Range   Heparin  Unfractionated 0.20 (L) 0.30 - 0.70 IU/mL    Comment: (NOTE) The clinical reportable range upper limit is being lowered to >1.10 to align with the FDA approved guidance for the  current laboratory assay.  If heparin  results are below expected values, and patient dosage has  been confirmed, suggest follow up testing of antithrombin III levels. Performed at [facility], [address] [place], [place] [address]   Iron  and TIBC     Status: Abnormal   Collection Time: 05/13/24  1:11 PM  Result Value Ref Range   Iron  20 (L) 45 - 182 ug/dL   TIBC 799 (L) 749 - 549 ug/dL   Saturation Ratios 10 (L) 17.9 - 39.5 %   UIBC 180 ug/dL    Comment: Performed at [facility], [address], [place], [place] [address]  Ferritin     Status: Abnormal   Collection Time: 05/13/24  1:11 PM  Result Value Ref Range   Ferritin 740 (H) 24 - 336 ng/mL    Comment: Performed at [facility], [address] [place], [place] [address]  Glucose, capillary     Status: None   Collection Time: 05/13/24  5:08 PM  Result Value Ref Range   Glucose-Capillary 83 70 - 99 mg/dL    Comment: Glucose reference range applies only to samples taken after fasting for at least 8 hours.   Comment 1 Notify RN    Comment 2 Document in Chart   Glucose, capillary     Status: Abnormal   Collection Time: 05/13/24  9:18 PM  Result Value Ref Range   Glucose-Capillary 111 (H) 70 - 99 mg/dL    Comment: Glucose reference range applies only to samples taken after fasting for at least 8 hours.  Heparin  level (unfractionated)     Status: None   Collection Time: 05/14/24  3:28 AM  Result Value Ref Range   Heparin  Unfractionated 0.59 0.30 - 0.70 IU/mL    Comment: (NOTE) The clinical reportable range upper limit is being lowered to >1.10 to align with the FDA approved guidance for the current laboratory assay.  If heparin  results are below expected values, and patient dosage has  been confirmed, suggest follow up testing of antithrombin III levels. Performed at [facility], [address], [place], [place] [address]   CBC     Status: Abnormal   Collection Time: 05/14/24  3:28 AM   Result Value Ref Range   WBC 8.5 4.0 - 10.5 K/uL   RBC 4.39 4.22 - 5.81 MIL/uL   Hemoglobin 11.4 (L) 13.0 - 17.0 g/dL   HCT 64.4 (L) 60.9 - 47.9 %   MCV 80.9 80.0 - 100.0 fL   MCH 26.0 26.0 - 34.0 pg   MCHC 32.1 30.0 - 36.0 g/dL   RDW 84.5 88.4 - 84.4 %   Platelets 191 150 - 400 K/uL   nRBC 0.0 0.0 - 0.2 %    Comment: Performed at [facility], [address], [place], [place] [address]  Glucose, capillary     Status: Abnormal   Collection Time: 05/14/24  5:54 AM  Result Value Ref Range   Glucose-Capillary 52 (L) 70 - 99 mg/dL    Comment: Glucose reference range applies only to samples taken after fasting for at least 8 hours.  Glucose, capillary  Status: Abnormal   Collection Time: 05/14/24  6:00 AM  Result Value Ref Range   Glucose-Capillary 51 (L) 70 - 99 mg/dL    Comment: Glucose reference range applies only to samples taken after fasting for at least 8 hours.  Glucose, capillary     Status: Abnormal   Collection Time: 05/14/24  6:31 AM  Result Value Ref Range   Glucose-Capillary 109 (H) 70 - 99 mg/dL    Comment: Glucose reference range applies only to samples taken after fasting for at least 8 hours.  Basic metabolic panel     Status: Abnormal   Collection Time: 05/14/24  7:38 AM  Result Value Ref Range   Sodium 139 135 - 145 mmol/L   Potassium 3.5 3.5 - 5.1 mmol/L   Chloride 105 98 - 111 mmol/L   CO2 23 22 - 32 mmol/L   Glucose, Bld 98 70 - 99 mg/dL    Comment: Glucose reference range applies only to samples taken after fasting for at least 8 hours.   BUN 29 (H) 8 - 23 mg/dL   Creatinine, Ser 8.18 (H) 0.61 - 1.24 mg/dL   Calcium  8.2 (L) 8.9 - 10.3 mg/dL   GFR, Estimated 40 (L) >60 mL/min    Comment: (NOTE) Calculated using the CKD-EPI Creatinine Equation (2021)    Anion gap 11 5 - 15    Comment: Performed at [facility], [address] [place], [place] [address]    ECG   N/A  Telemetry   Sinus rhythm with PVC's, PSVT - Personally  Reviewed  Radiology    ECHOCARDIOGRAM COMPLETE Result Date: 05/13/2024    ECHOCARDIOGRAM REPORT   Patient Name:   WISSAM RESOR Date of Exam: 05/13/2024 Medical Rec #:  [id number]       Height:       66.0 in Accession #:    [id number]      Weight:       238.5 lb Date of Birth:  07-25-1955       BSA:          2.155 m Patient Age:    68 years        BP:           134/76 mmHg Patient Gender: M               HR:           103 bpm. Exam Location:  Inpatient Procedure: 2D Echo, Cardiac Doppler, Color Doppler and Intracardiac            Opacification Agent (Both Spectral and Color Flow Doppler were            utilized during procedure). Indications:    Dyspnea R06  History:        Patient has prior history of Echocardiogram examinations, most                 recent 02/11/2024. Signs/Symptoms:Dyspnea.  Sonographer:    Merlynn Argyle Referring Phys: [id number] GILLIAN HERO [facility]  Sonographer Comments: Image acquisition challenging due to respiratory motion and Image acquisition challenging due to patient body habitus. IMPRESSIONS  1. Left ventricular ejection fraction, by estimation, is <20%. The left ventricle has severely decreased function. The left ventricle demonstrates global hypokinesis. The left ventricular internal cavity size was moderately dilated. There is mild concentric left ventricular hypertrophy. Left ventricular diastolic parameters are indeterminate.  2. Right ventricular systolic function is normal. The right ventricular size is normal. Tricuspid  regurgitation signal is inadequate for assessing PA pressure.  3. Left atrial size was moderately dilated.  4. Large pleural effusion in the left lateral region.  5. The mitral valve is normal in structure. Mild to moderate mitral valve regurgitation. No evidence of mitral stenosis.  6. The aortic valve is tricuspid. There is mild calcification of the aortic valve. Aortic valve regurgitation is trivial. Aortic valve sclerosis/calcification is present, without any  evidence of aortic stenosis.  7. The inferior vena cava is dilated in size with <50% respiratory variability, suggesting right atrial pressure of 15 mmHg. FINDINGS  Left Ventricle: Left ventricular ejection fraction, by estimation, is <20%. The left ventricle has severely decreased function. The left ventricle demonstrates global hypokinesis. Definity  contrast agent was given IV to delineate the left ventricular endocardial borders. The left ventricular internal cavity size was moderately dilated. There is mild concentric left ventricular hypertrophy. Left ventricular diastolic parameters are indeterminate. Right Ventricle: The right ventricular size is normal. No increase in right ventricular wall thickness. Right ventricular systolic function is normal. Tricuspid regurgitation signal is inadequate for assessing PA pressure. Left Atrium: Left atrial size was moderately dilated. Right Atrium: Right atrial size was normal in size. Pericardium: There is no evidence of pericardial effusion. Mitral Valve: The mitral valve is normal in structure. Mild to moderate mitral valve regurgitation. No evidence of mitral valve stenosis. Tricuspid Valve: The tricuspid valve is normal in structure. Tricuspid valve regurgitation is trivial. No evidence of tricuspid stenosis. Aortic Valve: The aortic valve is tricuspid. There is mild calcification of the aortic valve. Aortic valve regurgitation is trivial. Aortic valve sclerosis/calcification is present, without any evidence of aortic stenosis. Pulmonic Valve: The pulmonic valve was normal in structure. Pulmonic valve regurgitation is not visualized. No evidence of pulmonic stenosis. Aorta: The aortic root is normal in size and structure. Venous: The inferior vena cava is dilated in size with less than 50% respiratory variability, suggesting right atrial pressure of 15 mmHg. IAS/Shunts: No atrial level shunt detected by color flow Doppler. Additional Comments: There is a large  pleural effusion in the left lateral region.  LEFT VENTRICLE PLAX 2D LVIDd:         6.00 cm      Diastology LVIDs:         5.10 cm      LV e' medial:    7.29 cm/s LV PW:         1.10 cm      LV E/e' medial:  13.4 LV IVS:        1.00 cm      LV e' lateral:   10.30 cm/s LVOT diam:     2.10 cm      LV E/e' lateral: 9.5 LV SV:         46 LV SV Index:   22 LVOT Area:     3.46 cm LV IVRT:       77 msec  LV Volumes (MOD) LV vol d, MOD A2C: 132.5 ml LV vol d, MOD A4C: 188.0 ml LV vol s, MOD A2C: 97.3 ml LV vol s, MOD A4C: 132.0 ml LV SV MOD A2C:     35.2 ml LV SV MOD A4C:     188.0 ml LV SV MOD BP:      41.2 ml RIGHT VENTRICLE            IVC RV Basal diam:  3.10 cm    IVC diam: 2.20 cm RV S prime:  6.64 cm/s TAPSE (M-mode): 1.7 cm LEFT ATRIUM              Index        RIGHT ATRIUM           Index LA diam:        4.80 cm  2.23 cm/m   RA Area:     13.60 cm LA Vol (A2C):   63.0 ml  29.23 ml/m  RA Volume:   36.60 ml  16.98 ml/m LA Vol (A4C):   103.0 ml 47.79 ml/m LA Biplane Vol: 81.1 ml  37.63 ml/m  AORTIC VALVE LVOT Vmax:   76.60 cm/s LVOT Vmean:  61.900 cm/s LVOT VTI:    0.134 m  AORTA Ao Root diam: 3.50 cm Ao Asc diam:  3.30 cm MITRAL VALVE MV Area (PHT): 6.48 cm    SHUNTS MV Decel Time: 117 msec    Systemic VTI:  0.13 m MV E velocity: 97.50 cm/s  Systemic Diam: 2.10 cm MV A velocity: 61.80 cm/s MV E/A ratio:  1.58 Toribio Fuel MD Electronically signed by Toribio Fuel MD Signature Date/Time: 05/13/2024/5:06:04 PM    Final    DG Chest Port 1 View Result Date: 05/13/2024 EXAM: 1 VIEW(S) XRAY OF THE CHEST 05/13/2024 12:52:48 AM COMPARISON: None available. CLINICAL HISTORY: sob FINDINGS: LUNGS AND PLEURA: Low lung volumes. Layering bilateral pleural effusions. Bibasilar airspace opacities. No pneumothorax. HEART AND MEDIASTINUM: Cardiomegaly. BONES AND SOFT TISSUES: No acute osseous abnormality. IMPRESSION: 1. Layering bilateral pleural effusions with bibasilar atelectasis or pneumonia. Electronically  signed by: Norman Gatlin MD 05/13/2024 01:02 AM EST RP Workstation: [id number]    Cardiac Studies   See echo above - images personally reviewed  Assessment   Principal Problem:   Acute on chronic HFrEF (heart failure with reduced ejection fraction) (HCC) Active Problems:   Essential hypertension   Hyperlipidemia   Gout   Sleep apnea   Type 2 diabetes mellitus with hyperglycemia (HCC)   Acute renal failure superimposed on stage 3b chronic kidney disease (HCC)   Anemia due to chronic kidney disease   Elevated troponin   CAP (community acquired pneumonia)   Acute on chronic congestive heart failure (HCC)   Myocardial injury   Coronary artery disease involving coronary bypass graft of native heart without angina pectoris   Plan   Mr. Hinshaw has had further decline in LVEF to <20%. He has known multivessel CAD which is calcified and CTO of the RCA with collaterals. He is mildly improved with diuresis - creatinine improving with diuresis, therefore, would escalate diuresis today as d/c hospital medicine. Given very low EF and probable low output, would d/c metoprolol  and switch to amiodarone  for arrhythmia suppression. Potassium declining -low normal at 3.5 today- replete with 40 MEQ today in anticipation of escalated diuresis.  Given declining LVEF, we may need to consider revascularization of his CAD after he is more compensated, possibly Monday.  Time Spent Directly with Patient:  I have spent a total of 35 minutes with the patient reviewing hospital notes, telemetry, EKGs, labs and examining the patient as well as establishing an assessment and plan that was discussed personally with the patient.  > 50% of time was spent in direct patient care.  Length of Stay:  LOS: 1 day   Vinie KYM Maxcy, MD, [facility], FNLA, FACP  [place]  [facility]  Medical Director of the [facility]  [facility] Diplomate of the American Board of Clinical  Lipidology  Attending Cardiologist  Direct Dial : [phone]  Fax: [phone]  Website:  [email or web address] Vinie BROCKS Laketa Sandoz 05/14/2024, 9:59 AM   "

## 2024-05-15 DIAGNOSIS — I5023 Acute on chronic systolic (congestive) heart failure: Secondary | ICD-10-CM | POA: Diagnosis not present

## 2024-05-15 LAB — CBC
HCT: 36.4 % — ABNORMAL LOW (ref 39.0–52.0)
Hemoglobin: 11.7 g/dL — ABNORMAL LOW (ref 13.0–17.0)
MCH: 26.2 pg (ref 26.0–34.0)
MCHC: 32.1 g/dL (ref 30.0–36.0)
MCV: 81.6 fL (ref 80.0–100.0)
Platelets: 199 K/uL (ref 150–400)
RBC: 4.46 MIL/uL (ref 4.22–5.81)
RDW: 15.1 % (ref 11.5–15.5)
WBC: 8.5 K/uL (ref 4.0–10.5)
nRBC: 0 % (ref 0.0–0.2)

## 2024-05-15 LAB — GLUCOSE, CAPILLARY
Glucose-Capillary: 54 mg/dL — ABNORMAL LOW (ref 70–99)
Glucose-Capillary: 67 mg/dL — ABNORMAL LOW (ref 70–99)
Glucose-Capillary: 155 mg/dL — ABNORMAL HIGH (ref 70–99)
Glucose-Capillary: 82 mg/dL (ref 70–99)
Glucose-Capillary: 107 mg/dL — ABNORMAL HIGH (ref 70–99)
Glucose-Capillary: 103 mg/dL — ABNORMAL HIGH (ref 70–99)
Glucose-Capillary: 140 mg/dL — ABNORMAL HIGH (ref 70–99)

## 2024-05-15 LAB — BASIC METABOLIC PANEL WITH GFR
Anion gap: 8 (ref 5–15)
BUN: 29 mg/dL — ABNORMAL HIGH (ref 8–23)
CO2: 28 mmol/L (ref 22–32)
Calcium: 8.2 mg/dL — ABNORMAL LOW (ref 8.9–10.3)
Chloride: 104 mmol/L (ref 98–111)
Creatinine, Ser: 1.91 mg/dL — ABNORMAL HIGH (ref 0.61–1.24)
GFR, Estimated: 38 mL/min — ABNORMAL LOW
Glucose, Bld: 58 mg/dL — ABNORMAL LOW (ref 70–99)
Potassium: 3.7 mmol/L (ref 3.5–5.1)
Sodium: 140 mmol/L (ref 135–145)

## 2024-05-15 LAB — HEPARIN LEVEL (UNFRACTIONATED)
Heparin Unfractionated: 0.77 [IU]/mL — ABNORMAL HIGH (ref 0.30–0.70)
Heparin Unfractionated: 0.63 [IU]/mL (ref 0.30–0.70)

## 2024-05-15 MED ORDER — CARMEX CLASSIC LIP BALM EX OINT: 1.0000 | TOPICAL_OINTMENT | CUTANEOUS | Status: DC | PRN

## 2024-05-15 MED ORDER — GUAIFENESIN-DM 100-10 MG/5ML PO SYRP
5.0000 mL | ORAL_SOLUTION | ORAL | Status: DC | PRN
  Administered 2024-05-15 – 2024-05-20 (×12): 5 mL via ORAL
  Filled 2024-05-15 (×4): qty 5

## 2024-05-15 MED ORDER — LORATADINE 10 MG PO TABS
10.0000 mg | ORAL_TABLET | Freq: Every day | ORAL | Status: DC | PRN
  Administered 2024-05-15: 10 mg via ORAL
  Filled 2024-05-15: qty 1

## 2024-05-15 MED ORDER — POTASSIUM CHLORIDE CRYS ER 20 MEQ PO TBCR
20.0000 meq | EXTENDED_RELEASE_TABLET | Freq: Once | ORAL | Status: AC
  Administered 2024-05-15: 20 meq via ORAL
  Filled 2024-05-15: qty 1

## 2024-05-15 NOTE — Plan of Care (Signed)

## 2024-05-15 NOTE — Progress Notes (Signed)
 " PROGRESS NOTE    Thomas Gardner  FMW:[id number] DOB: 1955/11/12 DOA: 05/13/2024 PCP: Maree Leni Edyth DELENA, MD  Subjective: No acute events overnight. Seen and examined at bedside. Reports feeling weak and tired with episode of low blood sugar that has now resolved. Reports intermittent dyspnea on exertion and improvement in bilateral LE edema. Denies nausea, vomiting, constipatiom.   Hospital Course:  As per H&P: 68 y.o. male with history of chronic HFrEF last EF measured on September 2025 was 35 to 40%, has had cardiac cath in October 2025 which showedDist LAD lesion is 80% stenosed,Prox RCA lesion is 100% stenosed, Prox LAD to Mid LAD lesion is 70% stenose, diabetes mellitus type 2, chronic disease stage III, gout, sleep apnea, history of prostate cancer, history of stroke presents to the ER because of increasing shortness of breath with exertion over the last 2 to 3 days.  Patient states he also has been having some orthopnea and has noticed some weight gain and lower extremity edema more than usual.  Over the last one week patient has been having productive cough denies any fever or chills.  He has been taking Lasix  despite which he was getting more short of breath and decided to come to the ER.  He also has been having chest tightness on exertion last few days.   Assessment and Plan:  Acute on chronic HFrEF  - last EF measured in September 2025 was 35 to 40%.  - TTE 12/25 showed LVEF < 20%, global hypokinesis, dilated LV, mild concentric LVH, RV size/systolic function normal, moderately dilated LA, mild to moderate MR, dilated IVC with <50% respiratory variability   - Patient takes Lasix  20 mg at home.  - cont lasix  40 mg IV BID - cont amiodarone  for arrhythmia suppression as per cardio recs - Continue jardiance  and spironolactone . - hold home metoprolol  and Entresto  - monitor I/Os, daily weights, renal function, electrolytes - monitor on tele - cardiology following   Elevated  troponin NSTEMI  - concern for non-ST elevation MI given the recent cardiac cath showing severe disease that was not stented with plan to manage medically (Dist LAD lesion is 80% stenosed, prox RCA lesion is 100% stenosed, prox LAD to Mid LAD lesion is 70% stenoses) - TTE showed LVEF < 20%, global hypokinesis, dilated LV, mild concentric LVH, RV size/systolic function normal, moderately dilated LA, mild to moderate MR, dilated IVC with <50% respiratory variability - as per patient, history of myalgias with statins - home repatha  on hold as non-formulary - hold home metoprolol  - cont heparin  infusion - continue with aspirin   - may need repeat cardiac cath once more optimized in terms of fluid status and renal function - monitor on tele - cardiology following   Acute on chronic kidney disease stage III  - creatinine 1.8 < 2.04, 2.06 on admission, baseline around 1.7 - Possible cardiorenal syndrome.  - IV diuresis as elsewhere.  - hold home entresto  - monitor I/Os, renal function    Presumed pneumonia  - patient presented with productive cough  - CXR showed bilateral pleural effusion with associated atelectasis vs PNA - procalcitonin not elevated - stopped empiric antibiotics  - monitor clinically   Diabetes mellitus type 2  - HGbA1c 6.8 - takes Lantus .  - hold home Mounjaro    - cont Jardiance   - continue home dose of Lantus  with sliding scale coverage.    Anemia  - no signs/symptoms of acute bleed - Follow CBC  - will check iron   studies    History of gout on allopurinol .   Prior history of stroke on aspirin  and Repatha .   Sleep apnea CPAP at bedtime.  DVT prophylaxis:   IV Heparin    Code Status: Full Code  Disposition Plan: TBD Reason for continuing need for hospitalization: severity of illness  Objective: Vitals:   05/14/24 1900 05/14/24 2300 05/15/24 0300 05/15/24 0758  BP: (!) 151/79 (!) 116/58 137/77 (!) 122/58  Pulse: (!) 104 96 88   Resp: 18 20 20 18    Temp: 100.3 F (37.9 C) 99.4 F (37.4 C) 98.1 F (36.7 C) 98.1 F (36.7 C)  TempSrc: Oral Oral Oral Oral  SpO2:  94% 98%   Weight:   101.3 kg   Height:   5' 6 (1.676 m)     Intake/Output Summary (Last 24 hours) at 05/15/2024 1105 Last data filed at 05/15/2024 0300 Gross per 24 hour  Intake --  Output 2400 ml  Net -2400 ml   Filed Weights   05/13/24 1300 05/14/24 0500 05/15/24 0300  Weight: 108.2 kg 109.2 kg 101.3 kg    Examination:  Physical Exam Vitals and nursing note reviewed.  Constitutional:      General: He is not in acute distress.    Appearance: He is obese. He is ill-appearing.     Comments: Weak, frail  HENT:     Head: Normocephalic and atraumatic.  Cardiovascular:     Rate and Rhythm: Normal rate and regular rhythm.     Pulses: Normal pulses.     Heart sounds: Normal heart sounds.  Pulmonary:     Effort: Pulmonary effort is normal.     Breath sounds: Normal breath sounds.  Abdominal:     General: Bowel sounds are normal.     Palpations: Abdomen is soft.  Musculoskeletal:     Right lower leg: Edema present.     Left lower leg: Edema present.  Neurological:     Mental Status: He is alert.     Data Reviewed: I have personally reviewed following labs and imaging studies  CBC: Recent Labs  Lab 05/13/24 0051 05/13/24 0600 05/14/24 0328 05/15/24 0320  WBC 11.8* 11.0* 8.5 8.5  NEUTROABS  --  9.0*  --   --   HGB 11.7* 12.5* 11.4* 11.7*  HCT 36.8* 40.0 35.5* 36.4*  MCV 82.3 84.0 80.9 81.6  PLT 198 206 191 199   Basic Metabolic Panel: Recent Labs  Lab 05/13/24 0051 05/13/24 0600 05/14/24 0738 05/15/24 0320  NA 141 142 139 140  K 3.7 3.8 3.5 3.7  CL 107 107 105 104  CO2 23 23 23 28   GLUCOSE 200* 110* 98 58*  BUN 27* 27* 29* 29*  CREATININE 2.06* 2.04* 1.81* 1.91*  CALCIUM  8.0* 8.4* 8.2* 8.2*  MG  --  2.1 2.1  --    GFR: Estimated Creatinine Clearance: 41.3 mL/min (A) (by C-G formula based on SCr of 1.91 mg/dL (H)). Liver Function  Tests: Recent Labs  Lab 05/13/24 0600  AST 93*  ALT 23  ALKPHOS 104  BILITOT 0.4  PROT 6.4*  ALBUMIN 3.2*   No results for input(s): LIPASE, AMYLASE in the last 168 hours. No results for input(s): AMMONIA in the last 168 hours. Coagulation Profile: No results for input(s): INR, PROTIME in the last 168 hours. Cardiac Enzymes: No results for input(s): CKTOTAL, CKMB, CKMBINDEX, TROPONINI in the last 168 hours. ProBNP, BNP (last 5 results) Recent Labs    01/13/24 1208 01/30/24 1340 05/13/24 0051  PROBNP  --   --  8,752.0*  BNP 170.3* 119.0*  --    HbA1C: Recent Labs    05/13/24 0600  HGBA1C 6.8*   CBG: Recent Labs  Lab 05/14/24 1631 05/14/24 2128 05/15/24 0530 05/15/24 0553 05/15/24 0706  GLUCAP 118* 98 54* 67* 155*   Lipid Profile: Recent Labs    05/13/24 0600  CHOL 216*  HDL 46  LDLCALC 161*  TRIG 45  CHOLHDL 4.7   Thyroid Function Tests: Recent Labs    05/13/24 0600  TSH 0.769   Anemia Panel: Recent Labs    05/13/24 1311  FERRITIN 740*  TIBC 200*  IRON  20*   Sepsis Labs: Recent Labs  Lab 05/13/24 0600  PROCALCITON 0.60    Recent Results (from the past 240 hours)  Resp panel by RT-PCR (RSV, Flu A&B, Covid) Anterior Nasal Swab     Status: None   Collection Time: 05/13/24  3:33 AM   Specimen: Anterior Nasal Swab  Result Value Ref Range Status   SARS Coronavirus 2 by RT PCR NEGATIVE NEGATIVE Final   Influenza A by PCR NEGATIVE NEGATIVE Final   Influenza B by PCR NEGATIVE NEGATIVE Final    Comment: (NOTE) The Xpert Xpress SARS-CoV-2/FLU/RSV plus assay is intended as an aid in the diagnosis of influenza from Nasopharyngeal swab specimens and should not be used as a sole basis for treatment. Nasal washings and aspirates are unacceptable for Xpert Xpress SARS-CoV-2/FLU/RSV testing.  Fact Sheet for Patients: bloggercourse.com  Fact Sheet for Healthcare  Providers: seriousbroker.it  This test is not yet approved or cleared by the [place]  FDA and has been authorized for detection and/or diagnosis of SARS-CoV-2 by FDA under an Emergency Use Authorization (EUA). This EUA will remain in effect (meaning this test can be used) for the duration of the COVID-19 declaration under Section 564(b)(1) of the Act, 21 U.S.C. section 360bbb-3(b)(1), unless the authorization is terminated or revoked.     Resp Syncytial Virus by PCR NEGATIVE NEGATIVE Final    Comment: (NOTE) Fact Sheet for Patients: bloggercourse.com  Fact Sheet for Healthcare Providers: seriousbroker.it  This test is not yet approved or cleared by the [place]  FDA and has been authorized for detection and/or diagnosis of SARS-CoV-2 by FDA under an Emergency Use Authorization (EUA). This EUA will remain in effect (meaning this test can be used) for the duration of the COVID-19 declaration under Section 564(b)(1) of the Act, 21 U.S.C. section 360bbb-3(b)(1), unless the authorization is terminated or revoked.  Performed at [facility], [address], [place], [place] [address]      Radiology Studies: ECHOCARDIOGRAM COMPLETE Result Date: 05/13/2024    ECHOCARDIOGRAM REPORT   Patient Name:   JOHNATAN BASKETTE Date of Exam: 05/13/2024 Medical Rec #:  [id number]       Height:       66.0 in Accession #:    [id number]      Weight:       238.5 lb Date of Birth:  March 26, 1956       BSA:          2.155 m Patient Age:    68 years        BP:           134/76 mmHg Patient Gender: M               HR:           103 bpm. Exam Location:  Inpatient Procedure: 2D Echo,  Cardiac Doppler, Color Doppler and Intracardiac            Opacification Agent (Both Spectral and Color Flow Doppler were            utilized during procedure). Indications:    Dyspnea R06  History:        Patient has prior history of Echocardiogram  examinations, most                 recent 02/11/2024. Signs/Symptoms:Dyspnea.  Sonographer:    Merlynn Argyle Referring Phys: [id number] GILLIAN HERO [facility]  Sonographer Comments: Image acquisition challenging due to respiratory motion and Image acquisition challenging due to patient body habitus. IMPRESSIONS  1. Left ventricular ejection fraction, by estimation, is <20%. The left ventricle has severely decreased function. The left ventricle demonstrates global hypokinesis. The left ventricular internal cavity size was moderately dilated. There is mild concentric left ventricular hypertrophy. Left ventricular diastolic parameters are indeterminate.  2. Right ventricular systolic function is normal. The right ventricular size is normal. Tricuspid regurgitation signal is inadequate for assessing PA pressure.  3. Left atrial size was moderately dilated.  4. Large pleural effusion in the left lateral region.  5. The mitral valve is normal in structure. Mild to moderate mitral valve regurgitation. No evidence of mitral stenosis.  6. The aortic valve is tricuspid. There is mild calcification of the aortic valve. Aortic valve regurgitation is trivial. Aortic valve sclerosis/calcification is present, without any evidence of aortic stenosis.  7. The inferior vena cava is dilated in size with <50% respiratory variability, suggesting right atrial pressure of 15 mmHg. FINDINGS  Left Ventricle: Left ventricular ejection fraction, by estimation, is <20%. The left ventricle has severely decreased function. The left ventricle demonstrates global hypokinesis. Definity  contrast agent was given IV to delineate the left ventricular endocardial borders. The left ventricular internal cavity size was moderately dilated. There is mild concentric left ventricular hypertrophy. Left ventricular diastolic parameters are indeterminate. Right Ventricle: The right ventricular size is normal. No increase in right ventricular wall thickness. Right ventricular  systolic function is normal. Tricuspid regurgitation signal is inadequate for assessing PA pressure. Left Atrium: Left atrial size was moderately dilated. Right Atrium: Right atrial size was normal in size. Pericardium: There is no evidence of pericardial effusion. Mitral Valve: The mitral valve is normal in structure. Mild to moderate mitral valve regurgitation. No evidence of mitral valve stenosis. Tricuspid Valve: The tricuspid valve is normal in structure. Tricuspid valve regurgitation is trivial. No evidence of tricuspid stenosis. Aortic Valve: The aortic valve is tricuspid. There is mild calcification of the aortic valve. Aortic valve regurgitation is trivial. Aortic valve sclerosis/calcification is present, without any evidence of aortic stenosis. Pulmonic Valve: The pulmonic valve was normal in structure. Pulmonic valve regurgitation is not visualized. No evidence of pulmonic stenosis. Aorta: The aortic root is normal in size and structure. Venous: The inferior vena cava is dilated in size with less than 50% respiratory variability, suggesting right atrial pressure of 15 mmHg. IAS/Shunts: No atrial level shunt detected by color flow Doppler. Additional Comments: There is a large pleural effusion in the left lateral region.  LEFT VENTRICLE PLAX 2D LVIDd:         6.00 cm      Diastology LVIDs:         5.10 cm      LV e' medial:    7.29 cm/s LV PW:         1.10 cm      LV  E/e' medial:  13.4 LV IVS:        1.00 cm      LV e' lateral:   10.30 cm/s LVOT diam:     2.10 cm      LV E/e' lateral: 9.5 LV SV:         46 LV SV Index:   22 LVOT Area:     3.46 cm LV IVRT:       77 msec  LV Volumes (MOD) LV vol d, MOD A2C: 132.5 ml LV vol d, MOD A4C: 188.0 ml LV vol s, MOD A2C: 97.3 ml LV vol s, MOD A4C: 132.0 ml LV SV MOD A2C:     35.2 ml LV SV MOD A4C:     188.0 ml LV SV MOD BP:      41.2 ml RIGHT VENTRICLE            IVC RV Basal diam:  3.10 cm    IVC diam: 2.20 cm RV S prime:     6.64 cm/s TAPSE (M-mode): 1.7 cm LEFT  ATRIUM              Index        RIGHT ATRIUM           Index LA diam:        4.80 cm  2.23 cm/m   RA Area:     13.60 cm LA Vol (A2C):   63.0 ml  29.23 ml/m  RA Volume:   36.60 ml  16.98 ml/m LA Vol (A4C):   103.0 ml 47.79 ml/m LA Biplane Vol: 81.1 ml  37.63 ml/m  AORTIC VALVE LVOT Vmax:   76.60 cm/s LVOT Vmean:  61.900 cm/s LVOT VTI:    0.134 m  AORTA Ao Root diam: 3.50 cm Ao Asc diam:  3.30 cm MITRAL VALVE MV Area (PHT): 6.48 cm    SHUNTS MV Decel Time: 117 msec    Systemic VTI:  0.13 m MV E velocity: 97.50 cm/s  Systemic Diam: 2.10 cm MV A velocity: 61.80 cm/s MV E/A ratio:  1.58 Toribio Fuel MD Electronically signed by Toribio Fuel MD Signature Date/Time: 05/13/2024/5:06:04 PM    Final     Scheduled Meds:  allopurinol   100 mg Oral Daily   amiodarone   400 mg Oral BID   aspirin  EC  81 mg Oral Daily   empagliflozin   10 mg Oral Daily   furosemide   40 mg Intravenous BID   insulin  aspart  0-9 Units Subcutaneous TID WC   spironolactone   25 mg Oral Daily   tamsulosin   0.4 mg Oral Daily   Continuous Infusions:  heparin  1,350 Units/hr (05/15/24 0657)   iron  sucrose 200 mg (05/14/24 1103)     LOS: 2 days   Norval Bar, MD  [facility]  05/15/2024, 11:05 AM   "

## 2024-05-15 NOTE — Progress Notes (Signed)
 " Cardiology Consultation:   Patient ID: Thomas Gardner MRN: [id number]; DOB: Sep 26, 1955  Admit date: 05/13/2024 Date of Consult: 05/15/2024  Primary Care Provider: Maree Leni Edyth DELENA, MD [facility] Cardiologist: Annabella Scarce, MD  [facility] Electrophysiologist:  None   Patient Profile:   Thomas Gardner is a 68 y.o. 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   Interval Events:   Moderate improvement with shortness of breath but having multiple loose watery stools this morning with some blood clot mixed in stool.  Baseline dry weight was 215 pound but had increased to 224 pound and most recently was in the 240s.  242 lb today. IO net - [id number] yesterday after lasix  40 mg IV bid.   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   nueruologist---- dr rosemarie;    04-10-2022  per pt residul at times balance impaired   Arthritis    back shoulders and legs   Asthma    Bilateral lower extremity edema    @ times, goes away with legs elevated   CHF (congestive heart failure) (HCC)    Complication of anesthesia    woke up with endoscopy yrs ago   Diabetes mellitus type 2, insulin  dependent (HCC) 2003   Does mobilize using cane    Familial hyperlipidemia 05/07/2021   GERD (gastroesophageal reflux disease)    History of hiatal hernia    Hyperlipidemia    Hypertension    OSA (obstructive sleep apnea)    did not tolerate cpap, uses pillow for elevation   Prostate cancer [facility])    Past Surgical History:  Procedure Laterality Date   BREATH TEK H PYLORI  11/11/2011   Procedure: BREATH  TEK H PYLORI;  Surgeon: Alm VEAR Angle, MD;  Location: [facility];  Service: General;  Laterality: N/A;   CARDIAC CATHETERIZATION  2011   per pt done by Dr Ladona at [facility],  told no bloackages   colonscopy  2018   CORONARY PRESSURE/FFR WITH 3D MAPPING N/A 03/11/2024   Procedure: Coronary Pressure/FFR w/3D Mapping;  Surgeon: Darron Deatrice DELENA, MD;  Location: [facility];  Service: Cardiovascular;  Laterality: N/A;   ESOPHAGOGASTRODUODENOSCOPY (EGD) WITH PROPOFOL  N/A 05/04/2014   Procedure: ESOPHAGOGASTRODUODENOSCOPY (EGD) WITH PROPOFOL ;  Surgeon: Lamar Donnald GAILS, MD;  Location[facility];  Service: Endoscopy;  Laterality: N/A;   GOLD SEED IMPLANT N/A 04/16/2022   Procedure: GOLD SEED IMPLANT;  Surgeon: Elisabeth Valli BIRCH, MD;  Location: [facility];  Service: Urology;  Laterality: N/A;   LEFT HEART CATH AND CORONARY ANGIOGRAPHY N/A 03/11/2024   Procedure: LEFT HEART CATH AND CORONARY ANGIOGRAPHY;  Surgeon: Darron Deatrice DELENA, MD;  Location: [facility];  Service: Cardiovascular;  Laterality: N/A;    Inpatient Medications: Scheduled Meds:  allopurinol   100 mg Oral Daily   amiodarone   400 mg Oral BID   aspirin  EC  81 mg Oral Daily   empagliflozin   10 mg Oral Daily   furosemide   40 mg Intravenous BID   insulin  aspart  0-9 Units Subcutaneous TID WC   spironolactone   25 mg Oral  Daily   tamsulosin   0.4 mg Oral Daily   Continuous Infusions:  heparin  1,350 Units/hr (05/15/24 0657)   iron  sucrose 200 mg (05/14/24 1103)   PRN Meds: acetaminophen  **OR** acetaminophen   Allergies:   Allergies[1]  Social History:   Social History   Socioeconomic History   Marital status: Single    Spouse name: Not on file   Number of children: 0   Years of education: Not on file   Highest education level: Not on file  Occupational History   Occupation: Beautician  Tobacco Use   Smoking status: Never    Passive exposure: Never   Smokeless  tobacco: Never  Vaping Use   Vaping status: Never Used  Substance and Sexual Activity   Alcohol use: No    Alcohol/week: 0.0 standard drinks of alcohol   Drug use: No   Sexual activity: Not on file  Other Topics Concern   Not on file  Social History Narrative   Not on file   Social Drivers of Health   Tobacco Use: Low Risk (05/13/2024)   Patient History    Smoking Tobacco Use: Never    Smokeless Tobacco Use: Never    Passive Exposure: Never  Financial Resource Strain: Not on file  Food Insecurity: No Food Insecurity (05/13/2024)   Epic    Worried About Programme Researcher, Broadcasting/film/video in the Last Year: Never true    Ran Out of Food in the Last Year: Never true  Transportation Needs: No Transportation Needs (05/13/2024)   Epic    Lack of Transportation (Medical): No    Lack of Transportation (Non-Medical): No  Physical Activity: Not on file  Stress: Not on file  Social Connections: Patient Declined (05/13/2024)   Social Connection and Isolation Panel    Frequency of Communication with Friends and Family: Patient declined    Frequency of Social Gatherings with Friends and Family: Patient declined    Attends Religious Services: Patient declined    Active Member of Clubs or Organizations: Patient declined    Attends Banker Meetings: Patient declined    Marital Status: Patient declined  Intimate Partner Violence: Not At Risk (05/13/2024)   Epic    Fear of Current or Ex-Partner: No    Emotionally Abused: No    Physically Abused: No    Sexually Abused: No  Depression (PHQ2-9): Low Risk (04/19/2021)   Depression (PHQ2-9)    PHQ-2 Score: 0  Alcohol Screen: Not on file  Housing: Low Risk (05/13/2024)   Epic    Unable to Pay for Housing in the Last Year: No    Number of Times Moved in the Last Year: 0    Homeless in the Last Year: No  Utilities: Not At Risk (05/13/2024)   Epic    Threatened with loss of utilities: No  Health Literacy: Not on file    Family History:     Family History  Problem Relation Age of Onset   Heart failure Mother    Cancer Mother        ovarian   Diabetes Mother    Stroke Father    Heart failure Sister    Heart failure Brother    Colon cancer Brother    Cancer Maternal Aunt        breast   Diabetes Maternal Aunt    Cancer Maternal Uncle        colon    ROS:  Review of Systems: [y] = yes, [ ]  = no  General: Weight gain [ ] ; Weight loss [ ] ; Anorexia [ ] ; Fatigue [ ] ; Fever [ ] ; Chills [ ] ; Weakness [ ]    Cardiac: Chest pain/pressure [ ] ; Resting SOB [y]; Exertional SOB [ ] ; Orthopnea [ ] ; Pedal Edema [ ] ; Palpitations [ ] ; Syncope [ ] ; Presyncope [ ] ; Paroxysmal nocturnal dyspnea [ ]    Pulmonary: Cough [ ] ; Wheezing [ ] ; Hemoptysis [ ] ; Sputum [ ] ; Snoring [ ]    GI: Vomiting [ ] ; Dysphagia [ ] ; Melena [ ] ; Hematochezia [ ] ; Heartburn [ ] ; Abdominal pain [ ] ; Constipation [ ] ; Diarrhea [ ] ; BRBPR [ ]    GU: Hematuria [ ] ; Dysuria [ ] ; Nocturia [ ]  Vascular: Pain in legs with walking [ ] ; Pain in feet with lying flat [ ] ; Non-healing sores [ ] ; Stroke [ ] ; TIA [ ] ; Slurred speech [ ] ;   Neuro: Headaches [ ] ; Vertigo [ ] ; Seizures [ ] ; Paresthesias [ ] ;Blurred vision [ ] ; Diplopia [ ] ; Vision changes [ ]    Ortho/Skin: Arthritis [ ] ; Joint pain [ ] ; Muscle pain [ ] ; Joint swelling [ ] ; Back Pain [ ] ; Rash [ ]    Psych: Depression [ ] ; Anxiety [ ]    Heme: Bleeding problems [ ] ; Clotting disorders [ ] ; Anemia [ ]    Endocrine: Diabetes [ ] ; Thyroid dysfunction [ ]    Physical Exam/Data:   Vitals:   05/14/24 1900 05/14/24 2300 05/15/24 0300 05/15/24 0758  BP: (!) 151/79 (!) 116/58 137/77 (!) 122/58  Pulse: (!) 104 96 88   Resp: 18 20 20 18   Temp: 100.3 F (37.9 C) 99.4 F (37.4 C) 98.1 F (36.7 C) 98.1 F (36.7 C)  TempSrc: Oral Oral Oral Oral  SpO2:  94% 98%   Weight:   101.3 kg   Height:   5' 6 (1.676 m)    Intake/Output Summary (Last 24 hours) at 05/15/2024 1026 Last data filed at 05/15/2024 0300 Gross per  24 hour  Intake --  Output 2400 ml  Net -2400 ml      05/15/2024    3:00 AM 05/14/2024    5:00 AM 05/13/2024    1:00 PM  Last 3 Weights  Weight (lbs) 223 lb 5.2 oz 240 lb 11.9 oz 238 lb 8.6 oz  Weight (kg) 101.3 kg 109.2 kg 108.2 kg     Body mass index is 36.05 kg/m.  General: conversant, mildly dyspneic with discussion  HEENT: normal Vascular: No carotid bruits; FA pulses 2+ bilaterally without bruits  Cardiac:  normal S1, S2; RRR; no murmur  Lungs:  clear to auscultation bilaterally, no wheezing, rhonchi or rales  Abd: moderately distended  Ext: 3+ LE edema  Musculoskeletal:  No deformities, BUE and BLE strength normal and equal Skin: warm and dry  Neuro:  CNs 2-12 intact, no focal abnormalities noted Psych:  Normal affect   EKG:  The EKG was personally reviewed and demonstrates: none today  Telemetry:  Telemetry was personally reviewed and demonstrates: NSR 80-110s  Relevant CV Studies:  TTE  Result date: 05/13/24 1. Left ventricular ejection fraction, by estimation, is <20%. The left  ventricle has severely decreased function. The left ventricle demonstrates  global hypokinesis. The left ventricular internal cavity size was  moderately dilated. There is mild  concentric left ventricular hypertrophy. Left ventricular diastolic  parameters are indeterminate.   2. Right ventricular systolic function is normal. The right ventricular  size is normal. Tricuspid regurgitation signal is inadequate for assessing  PA pressure.  3. Left atrial size was moderately dilated.   4. Large pleural effusion in the left lateral region.   5. The mitral valve is normal in structure. Mild to moderate mitral valve  regurgitation. No evidence of mitral stenosis.   6. The aortic valve is tricuspid. There is mild calcification of the  aortic valve. Aortic valve regurgitation is trivial. Aortic valve  sclerosis/calcification is present, without any evidence of aortic  stenosis.   7. The  inferior vena cava is dilated in size with <50% respiratory  variability, suggesting right atrial pressure of 15 mmHg.   Laboratory Data:  Chemistry Recent Labs  Lab 05/13/24 0600 05/14/24 0738 05/15/24 0320  NA 142 139 140  K 3.8 3.5 3.7  CL 107 105 104  CO2 23 23 28   GLUCOSE 110* 98 58*  BUN 27* 29* 29*  CREATININE 2.04* 1.81* 1.91*  CALCIUM  8.4* 8.2* 8.2*  GFRNONAA 35* 40* 38*  ANIONGAP 12 11 8     Recent Labs  Lab 05/13/24 0600  PROT 6.4*  ALBUMIN 3.2*  AST 93*  ALT 23  ALKPHOS 104  BILITOT 0.4   Hematology Recent Labs  Lab 05/13/24 0600 05/14/24 0328 05/15/24 0320  WBC 11.0* 8.5 8.5  RBC 4.76 4.39 4.46  HGB 12.5* 11.4* 11.7*  HCT 40.0 35.5* 36.4*  MCV 84.0 80.9 81.6  MCH 26.3 26.0 26.2  MCHC 31.3 32.1 32.1  RDW 15.7* 15.4 15.1  PLT 206 191 199   BNP Recent Labs  Lab 05/13/24 0051  PROBNP 8,752.0*    Radiology/Studies:  ECHOCARDIOGRAM COMPLETE Result Date: 05/13/2024    ECHOCARDIOGRAM REPORT   Patient Name:   Thomas Gardner Date of Exam: 05/13/2024 Medical Rec #:  [id number]       Height:       66.0 in Accession #:    [id number]      Weight:       238.5 lb Date of Birth:  1955-07-14       BSA:          2.155 m Patient Age:    68 years        BP:           134/76 mmHg Patient Gender: M               HR:           103 bpm. Exam Location:  Inpatient Procedure: 2D Echo, Cardiac Doppler, Color Doppler and Intracardiac            Opacification Agent (Both Spectral and Color Flow Doppler were            utilized during procedure). Indications:    Dyspnea R06  History:        Patient has prior history of Echocardiogram examinations, most                 recent 02/11/2024. Signs/Symptoms:Dyspnea.  Sonographer:    Merlynn Argyle Referring Phys: [id number] GILLIAN HERO [facility]  Sonographer Comments: Image acquisition challenging due to respiratory motion and Image acquisition challenging due to patient body habitus. IMPRESSIONS  1. Left ventricular ejection fraction, by estimation,  is <20%. The left ventricle has severely decreased function. The left ventricle demonstrates global hypokinesis. The left ventricular internal cavity size was moderately dilated. There is mild concentric left ventricular hypertrophy. Left ventricular diastolic parameters are indeterminate.  2. Right ventricular systolic function is normal. The right ventricular size is normal. Tricuspid regurgitation signal is inadequate for assessing PA  pressure.  3. Left atrial size was moderately dilated.  4. Large pleural effusion in the left lateral region.  5. The mitral valve is normal in structure. Mild to moderate mitral valve regurgitation. No evidence of mitral stenosis.  6. The aortic valve is tricuspid. There is mild calcification of the aortic valve. Aortic valve regurgitation is trivial. Aortic valve sclerosis/calcification is present, without any evidence of aortic stenosis.  7. The inferior vena cava is dilated in size with <50% respiratory variability, suggesting right atrial pressure of 15 mmHg. FINDINGS  Left Ventricle: Left ventricular ejection fraction, by estimation, is <20%. The left ventricle has severely decreased function. The left ventricle demonstrates global hypokinesis. Definity  contrast agent was given IV to delineate the left ventricular endocardial borders. The left ventricular internal cavity size was moderately dilated. There is mild concentric left ventricular hypertrophy. Left ventricular diastolic parameters are indeterminate. Right Ventricle: The right ventricular size is normal. No increase in right ventricular wall thickness. Right ventricular systolic function is normal. Tricuspid regurgitation signal is inadequate for assessing PA pressure. Left Atrium: Left atrial size was moderately dilated. Right Atrium: Right atrial size was normal in size. Pericardium: There is no evidence of pericardial effusion. Mitral Valve: The mitral valve is normal in structure. Mild to moderate mitral valve  regurgitation. No evidence of mitral valve stenosis. Tricuspid Valve: The tricuspid valve is normal in structure. Tricuspid valve regurgitation is trivial. No evidence of tricuspid stenosis. Aortic Valve: The aortic valve is tricuspid. There is mild calcification of the aortic valve. Aortic valve regurgitation is trivial. Aortic valve sclerosis/calcification is present, without any evidence of aortic stenosis. Pulmonic Valve: The pulmonic valve was normal in structure. Pulmonic valve regurgitation is not visualized. No evidence of pulmonic stenosis. Aorta: The aortic root is normal in size and structure. Venous: The inferior vena cava is dilated in size with less than 50% respiratory variability, suggesting right atrial pressure of 15 mmHg. IAS/Shunts: No atrial level shunt detected by color flow Doppler. Additional Comments: There is a large pleural effusion in the left lateral region.  LEFT VENTRICLE PLAX 2D LVIDd:         6.00 cm      Diastology LVIDs:         5.10 cm      LV e' medial:    7.29 cm/s LV PW:         1.10 cm      LV E/e' medial:  13.4 LV IVS:        1.00 cm      LV e' lateral:   10.30 cm/s LVOT diam:     2.10 cm      LV E/e' lateral: 9.5 LV SV:         46 LV SV Index:   22 LVOT Area:     3.46 cm LV IVRT:       77 msec  LV Volumes (MOD) LV vol d, MOD A2C: 132.5 ml LV vol d, MOD A4C: 188.0 ml LV vol s, MOD A2C: 97.3 ml LV vol s, MOD A4C: 132.0 ml LV SV MOD A2C:     35.2 ml LV SV MOD A4C:     188.0 ml LV SV MOD BP:      41.2 ml RIGHT VENTRICLE            IVC RV Basal diam:  3.10 cm    IVC diam: 2.20 cm RV S prime:     6.64 cm/s TAPSE (M-mode): 1.7 cm LEFT  ATRIUM              Index        RIGHT ATRIUM           Index LA diam:        4.80 cm  2.23 cm/m   RA Area:     13.60 cm LA Vol (A2C):   63.0 ml  29.23 ml/m  RA Volume:   36.60 ml  16.98 ml/m LA Vol (A4C):   103.0 ml 47.79 ml/m LA Biplane Vol: 81.1 ml  37.63 ml/m  AORTIC VALVE LVOT Vmax:   76.60 cm/s LVOT Vmean:  61.900 cm/s LVOT VTI:    0.134  m  AORTA Ao Root diam: 3.50 cm Ao Asc diam:  3.30 cm MITRAL VALVE MV Area (PHT): 6.48 cm    SHUNTS MV Decel Time: 117 msec    Systemic VTI:  0.13 m MV E velocity: 97.50 cm/s  Systemic Diam: 2.10 cm MV A velocity: 61.80 cm/s MV E/A ratio:  1.58 Toribio Fuel MD Electronically signed by Toribio Fuel MD Signature Date/Time: 05/13/2024/5:06:04 PM    Final    DG Chest Port 1 View Result Date: 05/13/2024 EXAM: 1 VIEW(S) XRAY OF THE CHEST 05/13/2024 12:52:48 AM COMPARISON: None available. CLINICAL HISTORY: sob FINDINGS: LUNGS AND PLEURA: Low lung volumes. Layering bilateral pleural effusions. Bibasilar airspace opacities. No pneumothorax. HEART AND MEDIASTINUM: Cardiomegaly. BONES AND SOFT TISSUES: No acute osseous abnormality. IMPRESSION: 1. Layering bilateral pleural effusions with bibasilar atelectasis or pneumonia. Electronically signed by: Norman Gatlin MD 05/13/2024 01:02 AM EST RP Workstation: [id number]   Assessment and Plan:  Thomas Gardner is a 68 y.o. 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   ADHF HFrEF (LVEF 20%) ICM Symptomatic from ADHF with good urine output yesterday with Lasix  40 mg IV bid, net - 3871.  He still has a long way to go in terms of getting closer to his dry weight.  Has significant peripheral edema and is dyspneic at rest.  Further complicating things as he has blood clot in his stool on heparin  gtt. Will monitor Hb over the weekend, 11.7 today and stable from prior. Possibly hemorrhoidal although no hx. On ASA 81 mg daily, jardiance  10 mg daily, lasix  40 mg IV bid, spiro 20 mg daily. Holding BB as he likely has  a component of low output HF. Labs otherwise stable. Continue diuresis and hopefully can make some improvement over the weekend. Interventional team considering PCI of his calcified LAD however need to get him more euvolemic so that he can at least tolerate cath.  For questions or updates, please contact [facility] Please consult [email or web address] for contact info under   Signed, Donnice DELENA Primus, MD  05/15/2024 10:26 AM     [1]  Allergies Allergen Reactions   Erythromycin Nausea And Vomiting   Nsaids Other (See Comments)    Contraindication due to CKD    Onion Nausea And Vomiting   "

## 2024-05-15 NOTE — Evaluation (Signed)
 Occupational Therapy Evaluation Patient Details Name: Thomas Gardner MRN: [id number] DOB: 09-Feb-1956 Today's Date: 05/15/2024   History of Present Illness   Pt is a 68 y/o male admitted 05/13/24 with shortness of breath. Bil pleural effusions with ?pna; acute on chronic CHF (EF <20%); elevated troponin likely myocardial injury per Cardiology; PMH includes: arthritis, CHF, CAD, DM, HTN, morbid obesity, CKD, gout, prostate cancer, CVA.     Clinical Impressions Pt reports working and ind at baseline with ADLs and uses cane for functional mobility. Pt lives alone but plans to d/c to a friends home that has multiple steps and tub/shower, but will have close to 24/7 assist. Pt currently needs up to mod A for ADLs, mod I for bed mobility and CGA for transfers with RW. Pt able to ambulate short distance in room to sink for grooming task, notable BLE trembling. Pt presenting with impairments listed below, will follow acutely. Recommend HHOT at d/c.      If plan is discharge home, recommend the following:   A little help with walking and/or transfers;A little help with bathing/dressing/bathroom;Assistance with cooking/housework;Assist for transportation;Help with stairs or ramp for entrance     Functional Status Assessment   Patient has had a recent decline in their functional status and demonstrates the ability to make significant improvements in function in a reasonable and predictable amount of time.     Equipment Recommendations   None recommended by OT     Recommendations for Other Services   PT consult     Precautions/Restrictions   Precautions Precautions: Fall Recall of Precautions/Restrictions: Intact Restrictions Weight Bearing Restrictions Per Provider Order: No     Mobility Bed Mobility Overal bed mobility: Modified Independent                  Transfers Overall transfer level: Needs assistance Equipment used: Rolling walker (2 wheels) Transfers:  Sit to/from Stand Sit to Stand: Contact guard assist                  Balance Overall balance assessment: Mild deficits observed, not formally tested                                         ADL either performed or assessed with clinical judgement   ADL Overall ADL's : Needs assistance/impaired Eating/Feeding: Set up   Grooming: Set up;Oral care;Standing   Upper Body Bathing: Minimal assistance   Lower Body Bathing: Moderate assistance   Upper Body Dressing : Minimal assistance   Lower Body Dressing: Moderate assistance   Toilet Transfer: Contact guard assist;Ambulation;Rolling walker (2 wheels)   Toileting- Clothing Manipulation and Hygiene: Contact guard assist       Functional mobility during ADLs: Contact guard assist;Rolling walker (2 wheels)       Vision   Vision Assessment?: No apparent visual deficits     Perception Perception: Not tested       Praxis Praxis: Not tested       Pertinent Vitals/Pain Pain Assessment Pain Assessment: Faces Pain Score: 4  Faces Pain Scale: Hurts little more Pain Location: abd Pain Descriptors / Indicators: Tightness Pain Intervention(s): Limited activity within patient's tolerance, Monitored during session, Repositioned     Extremity/Trunk Assessment Upper Extremity Assessment Upper Extremity Assessment: Generalized weakness (hx neuropathy)   Lower Extremity Assessment Lower Extremity Assessment: Generalized weakness   Cervical / Trunk Assessment Cervical / Trunk  Assessment: Normal   Communication Communication Communication: No apparent difficulties   Cognition Arousal: Alert Behavior During Therapy: WFL for tasks assessed/performed Cognition: No apparent impairments                               Following commands: Intact       Cueing  General Comments   Cueing Techniques: Verbal cues  VSS   Exercises     Shoulder Instructions      Home Living  Family/patient expects to be discharged to:: Private residence Living Arrangements: Alone Available Help at Discharge: Friend(s);Available PRN/intermittently Type of Home: House Home Access: Stairs to enter [employer] of Steps: 2 Entrance Stairs-Rails: None Home Layout: One level     Bathroom Shower/Tub: Producer, Television/film/video: Handicapped height     Home Equipment: Grab bars - toilet;Grab bars - tub/shower;Cane - single point;Shower seat   Additional Comments: plans to d/c to a friends home with multiple steps to enter, tub shower, will have close to 24/7 assist, set up above is for pt's home      Prior Functioning/Environment Prior Level of Function : Independent/Modified Independent;Driving;History of Falls (last six months)             Mobility Comments: uses cane for stability; fell over his feet twice in 6 mos ADLs Comments: does his own grocery shopping    OT Problem List: Decreased strength;Decreased range of motion;Impaired balance (sitting and/or standing);Decreased activity tolerance;Cardiopulmonary status limiting activity   OT Treatment/Interventions: Self-care/ADL training;Therapeutic exercise;Energy conservation;DME and/or AE instruction;Therapeutic activities;Patient/family education;Balance training      OT Goals(Current goals can be found in the care plan section)   Acute Rehab OT Goals Patient Stated Goal: none stated OT Goal Formulation: With patient Time For Goal Achievement: 05/29/24 Potential to Achieve Goals: Good ADL Goals Pt Will Perform Upper Body Dressing: with modified independence;sitting Pt Will Perform Lower Body Dressing: with modified independence;sitting/lateral leans;sit to/from stand Pt Will Transfer to Toilet: with modified independence;ambulating;regular height toilet Additional ADL Goal #1: pt will verbalize x3 energy conservation strategies in prep for ADLs   OT Frequency:  Min 2X/week     Co-evaluation              AM-PAC OT 6 Clicks Daily Activity     Outcome Measure Help from another person eating meals?: A Little Help from another person taking care of personal grooming?: A Little Help from another person toileting, which includes using toliet, bedpan, or urinal?: A Little Help from another person bathing (including washing, rinsing, drying)?: A Lot Help from another person to put on and taking off regular upper body clothing?: A Little Help from another person to put on and taking off regular lower body clothing?: A Lot 6 Click Score: 16   End of Session Equipment Utilized During Treatment: Rolling walker (2 wheels) Nurse Communication: Mobility status (ok to have ice water, IV beeping)  Activity Tolerance: Patient tolerated treatment well Patient left: in bed;with call bell/phone within reach;with bed alarm set;with family/visitor present  OT Visit Diagnosis: Unsteadiness on feet (R26.81);Other abnormalities of gait and mobility (R26.89);Muscle weakness (generalized) (M62.81)                Time: 8694-8670 OT Time Calculation (min): 24 min Charges:  OT General Charges $OT Visit: 1 Visit OT Evaluation $OT Eval Low Complexity: 1 Low OT Treatments $Self Care/Home Management : 8-22 mins  Tashi Band K, OTD,  OTR/L SecureChat Preferred Acute Rehab [phone]   Laneta POUR Koonce 05/15/2024, 2:07 PM

## 2024-05-15 NOTE — Progress Notes (Signed)
 PHARMACY - ANTICOAGULATION CONSULT NOTE  Pharmacy Consult for Heparin  Indication: chest pain/ACS  Allergies[1]  Patient Measurements: Height: 5' 6 (167.6 cm) Weight: 101.3 kg (223 lb 5.2 oz) IBW/kg (Calculated) : 63.8 HEPARIN  DW (KG): 86.2  Vital Signs: Temp: 98.3 F (36.8 C) (12/27 1131) Temp Source: Oral (12/27 1131) BP: 154/85 (12/27 1131) Pulse Rate: 101 (12/27 1131)  Labs: Recent Labs    05/13/24 0600 05/13/24 1311 05/14/24 0328 05/14/24 0738 05/15/24 0320 05/15/24 1302  HGB 12.5*  --  11.4*  --  11.7*  --   HCT 40.0  --  35.5*  --  36.4*  --   PLT 206  --  191  --  199  --   HEPARINUNFRC  --    < > 0.59  --  0.77* 0.63  CREATININE 2.04*  --   --  1.81* 1.91*  --    < > = values in this interval not displayed.    Estimated Creatinine Clearance: 41.3 mL/min (A) (by C-G formula based on SCr of 1.91 mg/dL (H)).   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 male presenting with CHF and elevated troponin. He reports worsening dyspnea on exertion, PND, orthopnea, and leg swelling but no chest pain. Pharmacy is being consulted to dose heparin  for ACS rule-out. Patient is  not on anticoagulation prior to admission.  Heparin  level 0.63 is therapeutic with heparin  running at 1350 units/hr. Hgb (11.4) and PLTs (191) are trending down slightly. Patient renal function is stable. Per RN, no report of pauses, issues with the line, or signs of bleeding.   12/27 AM update:  Heparin  level supra-therapeutic   Goal of Therapy:  Heparin  level 0.3-0.7 units/ml Monitor platelets by anticoagulation protocol: Yes   Plan:  Continue heparin  at 1350 units/hr Heparin  level in am daily while on heparin  therapy  Donny Alert, PharmD, [facility] Clinical Pharmacist Please see AMION for all Pharmacists' Contact Phone Numbers 05/15/2024, 1:44 PM           [1]  Allergies Allergen Reactions   Erythromycin Nausea And Vomiting   Nsaids Other (See Comments)    Contraindication due to CKD    Onion Nausea And Vomiting  [2]  Current Facility-Administered Medications on File Prior to Encounter  Medication Dose Route Frequency Provider Last Rate Last Admin   sodium phosphate  (FLEET) 7-19 GM/118ML enema 1 enema  1 enema Rectal Once Cam Morene ORN, MD       Current Outpatient Medications on File Prior to Encounter  Medication Sig Dispense Refill   acetaminophen  (TYLENOL ) 325  MG tablet Take 1-2 tablets (325-650 mg total) by mouth every 4 (four) hours as needed for mild pain.     allopurinol  (ZYLOPRIM ) 100 MG tablet Take 1 tablet (100 mg total) by mouth daily. (Patient taking differently: Take 50 mg by mouth 2 (two) times daily.) 90 tablet 2   ascorbic acid  (VITAMIN C ) 500 MG tablet Take 1 tablet (500 mg total) by mouth daily. 100 tablet 0   aspirin  EC 81 MG tablet Take 1 tablet (81 mg total) by mouth daily. 90 tablet 0   empagliflozin  (JARDIANCE ) 25 MG TABS tablet Take 1 tablet (25 mg total) by mouth in the morning. (Patient taking differently: Take 12.5 mg by mouth in the morning and at bedtime.) 90 tablet 3   Evolocumab  (REPATHA  SURECLICK) 140 MG/ML SOAJ Inject 140 mg into the  skin every 14 (fourteen) days. Please keep your upcoming appointment for refills. 6 mL 3   furosemide  (LASIX ) 20 MG tablet Take 1 tablet (20 mg total) by mouth daily. May take an additional tablet as needed for weight gain of 2 lbs overnight or 5 lbs in one week. 135 tablet 2   insulin  glargine (LANTUS  SOLOSTAR) 100 UNIT/ML Solostar Pen Inject 16 Units into the skin every morning AND 20 Units at bedtime. 12 mL 3   metoprolol  succinate (TOPROL -XL) 50 MG 24 hr tablet Take 1 tablet (50 mg total) by mouth at bedtime. Take with or immediately following a meal. 90 tablet 2   sacubitril -valsartan  (ENTRESTO ) 49-51 MG Take 1 tablet by mouth 2 (two) times daily. 180 tablet 3   spironolactone  (ALDACTONE ) 25 MG tablet Take 1 tablet (25 mg total) by mouth daily. (Patient taking differently: Take 12.5 mg by mouth 2 (two) times daily.) 30 tablet 5   tirzepatide  (MOUNJARO ) 2.5 MG/0.5ML Pen Inject 2.5 mg into the skin once a week. 6 mL 3   vitamin D3 (CHOLECALCIFEROL ) 25 MCG tablet Take 1 tablet (1,000 Units total) by mouth daily. 100 tablet 0   Accu-Chek Softclix Lancets lancets Use to test Blood Sugars daily. 100 each 3   Continuous Glucose Sensor (FREESTYLE LIBRE 3 SENSOR) MISC Use as directed for blood sugar monitoring at least three times daily and as needed (Patient not taking: Reported on 05/13/2024) 6 each 3   glucose blood (ACCU-CHEK GUIDE TEST) test strip Use to test Blood Sugars once daily 300 strip 3   tamsulosin  (FLOMAX ) 0.4 MG CAPS capsule Take 1 capsule (0.4 mg total) by mouth daily. (Patient not taking: Reported on 05/13/2024) 30 capsule 11

## 2024-05-15 NOTE — Progress Notes (Signed)
 PHARMACY - ANTICOAGULATION CONSULT NOTE  Pharmacy Consult for Heparin  Indication: chest pain/ACS  Allergies[1]  Patient Measurements: Height: 5' 6 (167.6 cm) Weight: 109.2 kg (240 lb 11.9 oz) IBW/kg (Calculated) : 63.8 HEPARIN  DW (KG): 88.3  Vital Signs: Temp: 99.4 F (37.4 C) (12/26 2300) Temp Source: Oral (12/26 2300) BP: 116/58 (12/26 2300) Pulse Rate: 96 (12/26 2300)  Labs: Recent Labs    05/13/24 0600 05/13/24 1311 05/14/24 0328 05/14/24 0738 05/15/24 0320  HGB 12.5*  --  11.4*  --  11.7*  HCT 40.0  --  35.5*  --  36.4*  PLT 206  --  191  --  199  HEPARINUNFRC  --  0.20* 0.59  --  0.77*  CREATININE 2.04*  --   --  1.81* 1.91*    Estimated Creatinine Clearance: 42.9 mL/min (A) (by C-G formula based on SCr of 1.91 mg/dL (H)).   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 male presenting with CHF and elevated troponin. He reports worsening dyspnea on exertion, PND, orthopnea, and leg swelling but no chest pain. Pharmacy is being consulted to dose heparin  for ACS rule-out. Patient is not on anticoagulation prior to admission.  Heparin  level 0.59 is therapeutic with  heparin  running at 1450 units/hr. Hgb (11.4) and PLTs (191) are trending down slightly. Patient renal function is stable. Per RN, no report of pauses, issues with the line, or signs of bleeding.   12/27 AM update:  Heparin  level supra-therapeutic   Goal of Therapy:  Heparin  level 0.3-0.7 units/ml Monitor platelets by anticoagulation protocol: Yes   Plan:  Dec heparin  to 1350 units/hr Heparin  level in 8 hours  Lynwood Mckusick, PharmD, BCPS Clinical Pharmacist Phone: [phone]        [1]  Allergies Allergen Reactions   Erythromycin Nausea And Vomiting   Nsaids Other (See Comments)    Contraindication due to CKD    Onion Nausea And Vomiting  [2]  Current Facility-Administered Medications on File Prior to Encounter  Medication Dose Route Frequency Provider Last Rate Last Admin   sodium phosphate  (FLEET) 7-19 GM/118ML enema 1 enema  1 enema Rectal Once Cam Morene ORN, MD       Current Outpatient Medications on File Prior to Encounter  Medication Sig Dispense Refill   acetaminophen  (TYLENOL ) 325 MG tablet Take 1-2 tablets (325-650 mg total) by mouth every 4 (four) hours as needed for mild pain.     allopurinol  (ZYLOPRIM ) 100 MG tablet Take 1 tablet (100 mg total) by mouth daily. (Patient taking differently: Take 50 mg by mouth 2 (  two) times daily.) 90 tablet 2   ascorbic acid  (VITAMIN C ) 500 MG tablet Take 1 tablet (500 mg total) by mouth daily. 100 tablet 0   aspirin  EC 81 MG tablet Take 1 tablet (81 mg total) by mouth daily. 90 tablet 0   empagliflozin  (JARDIANCE ) 25 MG TABS tablet Take 1 tablet (25 mg total) by mouth in the morning. (Patient taking differently: Take 12.5 mg by mouth in the morning and at bedtime.) 90 tablet 3   Evolocumab  (REPATHA  SURECLICK) 140 MG/ML SOAJ Inject 140 mg into the skin every 14 (fourteen) days. Please keep your upcoming appointment for refills. 6 mL 3   furosemide  (LASIX ) 20 MG tablet Take 1 tablet (20 mg total) by mouth daily. May take an  additional tablet as needed for weight gain of 2 lbs overnight or 5 lbs in one week. 135 tablet 2   insulin  glargine (LANTUS  SOLOSTAR) 100 UNIT/ML Solostar Pen Inject 16 Units into the skin every morning AND 20 Units at bedtime. 12 mL 3   metoprolol  succinate (TOPROL -XL) 50 MG 24 hr tablet Take 1 tablet (50 mg total) by mouth at bedtime. Take with or immediately following a meal. 90 tablet 2   sacubitril -valsartan  (ENTRESTO ) 49-51 MG Take 1 tablet by mouth 2 (two) times daily. 180 tablet 3   spironolactone  (ALDACTONE ) 25 MG tablet Take 1 tablet (25 mg total) by mouth daily. (Patient taking differently: Take 12.5 mg by mouth 2 (two) times daily.) 30 tablet 5   tirzepatide  (MOUNJARO ) 2.5 MG/0.5ML Pen Inject 2.5 mg into the skin once a week. 6 mL 3   vitamin D3 (CHOLECALCIFEROL ) 25 MCG tablet Take 1 tablet (1,000 Units total) by mouth daily. 100 tablet 0   [DISCONTINUED] albuterol  (VENTOLIN  HFA) 108 (90 Base) MCG/ACT inhaler Inhale 1-2 puffs into the lungs every 6 (six) hours as needed. (Patient taking differently: Inhale 1-2 puffs into the lungs every 6 (six) hours as needed for wheezing or shortness of breath.) 9 g 3   Accu-Chek Softclix Lancets lancets Use to test Blood Sugars daily. 100 each 3   Continuous Glucose Sensor (FREESTYLE LIBRE 3 SENSOR) MISC Use as directed for blood sugar monitoring at least three times daily and as needed (Patient not taking: Reported on 05/13/2024) 6 each 3   glucose blood (ACCU-CHEK GUIDE TEST) test strip Use to test Blood Sugars once daily 300 strip 3   tamsulosin  (FLOMAX ) 0.4 MG CAPS capsule Take 1 capsule (0.4 mg total) by mouth daily. (Patient not taking: Reported on 05/13/2024) 30 capsule 11

## 2024-05-15 NOTE — Progress Notes (Signed)
 Mobility Specialist Progress Note:   05/15/24 1300  Mobility  Activity Ambulated with assistance  Level of Assistance Contact guard assist, steadying assist  Assistive Device Front wheel walker  Distance Ambulated (ft) 15 ft (x2)  Activity Response Tolerated well  Mobility Referral Yes  Mobility visit 1 Mobility  Mobility Specialist Start Time (ACUTE ONLY) 0900  Mobility Specialist Stop Time (ACUTE ONLY) 0913  Mobility Specialist Time Calculation (min) (ACUTE ONLY) 13 min   Pt received in bed hesitant but agreeable to mobility. No physical assistance needed just contact guard d/t mild bilat knee buckling during session. C/o stomach pain other wise tolerated well. Distance limited d/t fatigue. Returned to room w/o fault. Left in bed w/ call bell and personal belongings in reach. All needs met.  Thersia Minder Mobility Specialist  Please contact vis Secure Chat or  Rehab Office [phone]

## 2024-05-16 ENCOUNTER — Inpatient Hospital Stay (HOSPITAL_COMMUNITY)

## 2024-05-16 ENCOUNTER — Other Ambulatory Visit (HOSPITAL_COMMUNITY): Payer: Self-pay

## 2024-05-16 DIAGNOSIS — I5023 Acute on chronic systolic (congestive) heart failure: Secondary | ICD-10-CM | POA: Diagnosis not present

## 2024-05-16 LAB — GLUCOSE, CAPILLARY
Glucose-Capillary: 99 mg/dL (ref 70–99)
Glucose-Capillary: 136 mg/dL — ABNORMAL HIGH (ref 70–99)
Glucose-Capillary: 136 mg/dL — ABNORMAL HIGH (ref 70–99)
Glucose-Capillary: 126 mg/dL — ABNORMAL HIGH (ref 70–99)
Glucose-Capillary: 157 mg/dL — ABNORMAL HIGH (ref 70–99)
Glucose-Capillary: 105 mg/dL — ABNORMAL HIGH (ref 70–99)
Glucose-Capillary: 106 mg/dL — ABNORMAL HIGH (ref 70–99)

## 2024-05-16 LAB — BASIC METABOLIC PANEL WITH GFR
Anion gap: 9 (ref 5–15)
BUN: 29 mg/dL — ABNORMAL HIGH (ref 8–23)
CO2: 25 mmol/L (ref 22–32)
Calcium: 8 mg/dL — ABNORMAL LOW (ref 8.9–10.3)
Chloride: 104 mmol/L (ref 98–111)
Creatinine, Ser: 2.09 mg/dL — ABNORMAL HIGH (ref 0.61–1.24)
GFR, Estimated: 34 mL/min — ABNORMAL LOW
Glucose, Bld: 133 mg/dL — ABNORMAL HIGH (ref 70–99)
Potassium: 3.2 mmol/L — ABNORMAL LOW (ref 3.5–5.1)
Sodium: 138 mmol/L (ref 135–145)

## 2024-05-16 LAB — CBC
HCT: 35.5 % — ABNORMAL LOW (ref 39.0–52.0)
Hemoglobin: 11.3 g/dL — ABNORMAL LOW (ref 13.0–17.0)
MCH: 26.1 pg (ref 26.0–34.0)
MCHC: 31.8 g/dL (ref 30.0–36.0)
MCV: 82 fL (ref 80.0–100.0)
Platelets: 180 K/uL (ref 150–400)
RBC: 4.33 MIL/uL (ref 4.22–5.81)
RDW: 15 % (ref 11.5–15.5)
WBC: 6.1 K/uL (ref 4.0–10.5)
nRBC: 0 % (ref 0.0–0.2)

## 2024-05-16 LAB — HEPARIN LEVEL (UNFRACTIONATED): Heparin Unfractionated: 0.81 [IU]/mL — ABNORMAL HIGH (ref 0.30–0.70)

## 2024-05-16 LAB — MAGNESIUM: Magnesium: 2 mg/dL (ref 1.7–2.4)

## 2024-05-16 MED ORDER — SACUBITRIL-VALSARTAN 24-26 MG PO TABS: 1.0000 | ORAL_TABLET | Freq: Two times a day (BID) | ORAL | Status: DC

## 2024-05-16 MED ORDER — METOPROLOL SUCCINATE ER 50 MG PO TB24
50.0000 mg | ORAL_TABLET | Freq: Every day | ORAL | Status: DC
  Administered 2024-05-16: 50 mg via ORAL
  Filled 2024-05-16: qty 1

## 2024-05-16 MED ORDER — FUROSEMIDE 10 MG/ML IJ SOLN
80.0000 mg | Freq: Two times a day (BID) | INTRAMUSCULAR | Status: DC
  Administered 2024-05-16 – 2024-05-17 (×3): 80 mg via INTRAVENOUS
  Filled 2024-05-16 (×2): qty 8

## 2024-05-16 MED ORDER — POTASSIUM CHLORIDE CRYS ER 20 MEQ PO TBCR
40.0000 meq | EXTENDED_RELEASE_TABLET | ORAL | Status: AC
  Administered 2024-05-16 (×2): 40 meq via ORAL
  Filled 2024-05-16 (×2): qty 2

## 2024-05-16 MED ORDER — SPIRONOLACTONE 25 MG PO TABS: 50.0000 mg | ORAL_TABLET | Freq: Every day | ORAL | Status: DC

## 2024-05-16 MED ORDER — ALBUTEROL SULFATE (2.5 MG/3ML) 0.083% IN NEBU
2.5000 mg | INHALATION_SOLUTION | Freq: Four times a day (QID) | RESPIRATORY_TRACT | Status: DC | PRN
  Filled 2024-05-16: qty 3

## 2024-05-16 MED ORDER — ALBUTEROL SULFATE HFA 108 (90 BASE) MCG/ACT IN AERS: 1.0000 | INHALATION_SPRAY | Freq: Four times a day (QID) | RESPIRATORY_TRACT | Status: DC | PRN

## 2024-05-16 NOTE — Progress Notes (Signed)
 " Cardiology Consultation:   Patient ID: [name] MRN: [id number]; DOB: 27-Apr-1956  Admit date: 05/13/2024 Date of Consult: 05/16/2024  Primary Care Provider: Maree Leni Edyth DELENA, MD [facility] Cardiologist: Annabella Scarce, MD  [facility] Electrophysiologist:  None   Patient Profile:   Thomas Gardner is a 68 y.o. 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   Interval Events:   He reports further improvement in resting SOB although his weight is +5 kg despite being net negative another 660 cc over the past 24 hours.  Denies any recurrent blood in the stool.  Resting comfortably without supplemental O2.  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   nueruologist---- dr rosemarie;    04-10-2022  per pt residul at times balance impaired   Arthritis    back shoulders and legs   Asthma    Bilateral lower extremity edema    @ times, goes away with legs elevated   CHF (congestive heart failure) (HCC)    Complication of anesthesia    woke up with endoscopy yrs ago   Diabetes mellitus type 2, insulin  dependent (HCC) 2003   Does mobilize using cane    Familial hyperlipidemia 05/07/2021   GERD (gastroesophageal reflux disease)    History of hiatal hernia    Hyperlipidemia    Hypertension    OSA (obstructive sleep apnea)    did not tolerate cpap, uses pillow for elevation   Prostate cancer [facility])    Past Surgical History:  Procedure Laterality Date   BREATH TEK H PYLORI  11/11/2011   Procedure: BREATH TEK H PYLORI;  Surgeon: Alm VEAR Angle, MD;  Location: [facility];   Service: General;  Laterality: N/A;   CARDIAC CATHETERIZATION  2011   per pt done by Dr Ladona at [facility],  told no bloackages   colonscopy  2018   CORONARY PRESSURE/FFR WITH 3D MAPPING N/A 03/11/2024   Procedure: Coronary Pressure/FFR w/3D Mapping;  Surgeon: Darron Deatrice DELENA, MD;  Location: [facility];  Service: Cardiovascular;  Laterality: N/A;   ESOPHAGOGASTRODUODENOSCOPY (EGD) WITH PROPOFOL  N/A 05/04/2014   Procedure: ESOPHAGOGASTRODUODENOSCOPY (EGD) WITH PROPOFOL ;  Surgeon: Lamar Donnald GAILS, MD;  Location: [facility] END[facility]COPY;  Service: Endoscopy;  Laterality: N/A;   GOLD SEED IMPLANT N/A 04/16/2022   Procedure: GOLD SEED IMPLANT;  Surgeon: Elisabeth Valli BIRCH, MD;  Location: [facility];  Service: Urology;  Laterality: N/A;   LEFT HEART CATH AND CORONARY ANGIOGRAPHY N/A 03/11/2024   Procedure: LEFT HEART CATH AND CORONARY ANGIOGRAPHY;  Surgeon: Darron Deatrice DELENA, MD;  Location: [facility] CV [facility];  Service: Cardiovascular;  Laterality: N/A;   Inpatient Medications: Scheduled Meds:  allopurinol   100 mg Oral Daily   amiodarone   400 mg Oral BID   aspirin  EC  81 mg Oral Daily   empagliflozin   10 mg Oral Daily   furosemide   80 mg Intravenous BID   insulin  aspart  0-9 Units Subcutaneous TID WC   spironolactone   25 mg Oral Daily   tamsulosin   0.4 mg Oral Daily   Continuous Infusions:  heparin  1,200 Units/hr (05/16/24 0415)  iron  sucrose Stopped (05/15/24 1437)   PRN Meds: acetaminophen  **OR** acetaminophen , guaiFENesin -dextromethorphan , lip balm, loratadine   Allergies:   Allergies[1]  Social History:   Social History   Socioeconomic History   Marital status: Single    Spouse name: Not on file   Number of children: 0   Years of education: Not on file   Highest education level: Not on file  Occupational History   Occupation: Beautician  Tobacco Use   Smoking status: Never    Passive exposure: Never   Smokeless tobacco: Never  Vaping  Use   Vaping status: Never Used  Substance and Sexual Activity   Alcohol use: No    Alcohol/week: 0.0 standard drinks of alcohol   Drug use: No   Sexual activity: Not on file  Other Topics Concern   Not on file  Social History Narrative   Not on file   Social Drivers of Health   Tobacco Use: Low Risk (05/13/2024)   Patient History    Smoking Tobacco Use: Never    Smokeless Tobacco Use: Never    Passive Exposure: Never  Financial Resource Strain: Not on file  Food Insecurity: No Food Insecurity (05/13/2024)   Epic    Worried About Programme Researcher, Broadcasting/film/video in the Last Year: Never true    Ran Out of Food in the Last Year: Never true  Transportation Needs: No Transportation Needs (05/13/2024)   Epic    Lack of Transportation (Medical): No    Lack of Transportation (Non-Medical): No  Physical Activity: Not on file  Stress: Not on file  Social Connections: Patient Declined (05/13/2024)   Social Connection and Isolation Panel    Frequency of Communication with Friends and Family: Patient declined    Frequency of Social Gatherings with Friends and Family: Patient declined    Attends Religious Services: Patient declined    Active Member of Clubs or Organizations: Patient declined    Attends Banker Meetings: Patient declined    Marital Status: Patient declined  Intimate Partner Violence: Not At Risk (05/13/2024)   Epic    Fear of Current or Ex-Partner: No    Emotionally Abused: No    Physically Abused: No    Sexually Abused: No  Depression (PHQ2-9): Low Risk (04/19/2021)   Depression (PHQ2-9)    PHQ-2 Score: 0  Alcohol Screen: Not on file  Housing: Low Risk (05/13/2024)   Epic    Unable to Pay for Housing in the Last Year: No    Number of Times Moved in the Last Year: 0    Homeless in the Last Year: No  Utilities: Not At Risk (05/13/2024)   Epic    Threatened with loss of utilities: No  Health Literacy: Not on file    Family History:    Family History   Problem Relation Age of Onset   Heart failure Mother    Cancer Mother        ovarian   Diabetes Mother    Stroke Father    Heart failure Sister    Heart failure Brother    Colon cancer Brother    Cancer Maternal Aunt        breast   Diabetes Maternal Aunt    Cancer Maternal Uncle        colon    ROS:  Review of Systems: [y] = yes, [ ]  = no      General: Weight gain [ ] ; Weight loss [ ] ; Anorexia [ ] ; Fatigue [ ] ;  Fever [ ] ; Chills [ ] ; Weakness [ ]    Cardiac: Chest pain/pressure [ ] ; Resting SOB [y]; Exertional SOB [ ] ; Orthopnea [ ] ; Pedal Edema [ ] ; Palpitations [ ] ; Syncope [ ] ; Presyncope [ ] ; Paroxysmal nocturnal dyspnea [ ]    Pulmonary: Cough [ ] ; Wheezing [ ] ; Hemoptysis [ ] ; Sputum [ ] ; Snoring [ ]    GI: Vomiting [ ] ; Dysphagia [ ] ; Melena [ ] ; Hematochezia [ ] ; Heartburn [ ] ; Abdominal pain [ ] ; Constipation [ ] ; Diarrhea [ ] ; BRBPR [ ]    GU: Hematuria [ ] ; Dysuria [ ] ; Nocturia [ ]  Vascular: Pain in legs with walking [ ] ; Pain in feet with lying flat [ ] ; Non-healing sores [ ] ; Stroke [ ] ; TIA [ ] ; Slurred speech [ ] ;   Neuro: Headaches [ ] ; Vertigo [ ] ; Seizures [ ] ; Paresthesias [ ] ;Blurred vision [ ] ; Diplopia [ ] ; Vision changes [ ]    Ortho/Skin: Arthritis [ ] ; Joint pain [ ] ; Muscle pain [ ] ; Joint swelling [ ] ; Back Pain [ ] ; Rash [ ]    Psych: Depression [ ] ; Anxiety [ ]    Heme: Bleeding problems [ ] ; Clotting disorders [ ] ; Anemia [ ]    Endocrine: Diabetes [ ] ; Thyroid dysfunction [ ]    Physical Exam/Data:   Vitals:   05/16/24 0116 05/16/24 0136 05/16/24 0424 05/16/24 0721  BP: (!) 151/72  139/73 132/69  Pulse:   86 88  Resp: 20  18 18   Temp: 98 F (36.7 C)  98.5 F (36.9 C) 97.9 F (36.6 C)  TempSrc: Oral  Oral Oral  SpO2:    95%  Weight:  106.7 kg    Height:        Intake/Output Summary (Last 24 hours) at 05/16/2024 0917 Last data filed at 05/16/2024 0100 Gross per 24 hour  Intake 1391.18 ml  Output 2000 ml  Net -608.82 ml      05/16/2024     1:36 AM 05/15/2024    3:00 AM 05/14/2024    5:00 AM  Last 3 Weights  Weight (lbs) 235 lb 3.2 oz 223 lb 5.2 oz 240 lb 11.9 oz  Weight (kg) 106.686 kg 101.3 kg 109.2 kg     Body mass index is 37.96 kg/m.  General:  Well nourished, well developed, in no acute distress HEENT: normal Cardiac:  normal S1, S2; RRR; no murmur  Lungs:  clear to auscultation bilaterally, no wheezing, rhonchi or rales  Abd: soft, nontender, no hepatomegaly  Ext: 2+ LE edema b/l, moderate abdominal distension  Musculoskeletal:  No deformities, BUE and BLE strength normal and equal Skin: warm and dry  Neuro:  CNs 2-12 intact, no focal abnormalities noted Psych:  Normal affect   EKG:  The EKG was personally reviewed and demonstrates: None today  Telemetry:  Telemetry was personally reviewed and demonstrates: NSR 80-100s  Relevant CV Studies:  TTE  Result date: 05/13/24 1. Left ventricular ejection fraction, by estimation, is <20%. The left  ventricle has severely decreased function. The left ventricle demonstrates  global hypokinesis. The left ventricular internal cavity size was  moderately dilated. There is mild  concentric left ventricular hypertrophy. Left ventricular diastolic  parameters are indeterminate.   2. Right ventricular systolic function is normal. The right ventricular  size is normal. Tricuspid regurgitation signal is inadequate for assessing  PA pressure.   3. Left atrial size was moderately dilated.   4. Large pleural effusion in the left lateral region.   5. The  mitral valve is normal in structure. Mild to moderate mitral valve  regurgitation. No evidence of mitral stenosis.   6. The aortic valve is tricuspid. There is mild calcification of the  aortic valve. Aortic valve regurgitation is trivial. Aortic valve  sclerosis/calcification is present, without any evidence of aortic  stenosis.   7. The inferior vena cava is dilated in size with <50% respiratory  variability, suggesting  right atrial pressure of 15 mmHg.   Laboratory Data:  Chemistry Recent Labs  Lab 05/14/24 0738 05/15/24 0320 05/16/24 0259  NA 139 140 138  K 3.5 3.7 3.2*  CL 105 104 104  CO2 23 28 25   GLUCOSE 98 58* 133*  BUN 29* 29* 29*  CREATININE 1.81* 1.91* 2.09*  CALCIUM  8.2* 8.2* 8.0*  GFRNONAA 40* 38* 34*  ANIONGAP 11 8 9     Recent Labs  Lab 05/13/24 0600  PROT 6.4*  ALBUMIN 3.2*  AST 93*  ALT 23  ALKPHOS 104  BILITOT 0.4   Hematology Recent Labs  Lab 05/14/24 0328 05/15/24 0320 05/16/24 0259  WBC 8.5 8.5 6.1  RBC 4.39 4.46 4.33  HGB 11.4* 11.7* 11.3*  HCT 35.5* 36.4* 35.5*  MCV 80.9 81.6 82.0  MCH 26.0 26.2 26.1  MCHC 32.1 32.1 31.8  RDW 15.4 15.1 15.0  PLT 191 199 180   BNP Recent Labs  Lab 05/13/24 0051  PROBNP 8,752.0*    Radiology/Studies:  ECHOCARDIOGRAM COMPLETE Result Date: 05/13/2024    ECHOCARDIOGRAM REPORT   Patient Name:   QUANTAVIOUS EGGERT Date of Exam: 05/13/2024 Medical Rec #:  [id number]       Height:       66.0 in Accession #:    [id number]      Weight:       238.5 lb Date of Birth:  03-11-1956       BSA:          2.155 m Patient Age:    68 years        BP:           134/76 mmHg Patient Gender: M               HR:           103 bpm. Exam Location:  Inpatient Procedure: 2D Echo, Cardiac Doppler, Color Doppler and Intracardiac            Opacification Agent (Both Spectral and Color Flow Doppler were            utilized during procedure). Indications:    Dyspnea R06  History:        Patient has prior history of Echocardiogram examinations, most                 recent 02/11/2024. Signs/Symptoms:Dyspnea.  Sonographer:    Merlynn Argyle Referring Phys: [id number] GILLIAN HERO [facility]  Sonographer Comments: Image acquisition challenging due to respiratory motion and Image acquisition challenging due to patient body habitus. IMPRESSIONS  1. Left ventricular ejection fraction, by estimation, is <20%. The left ventricle has severely decreased function. The left ventricle  demonstrates global hypokinesis. The left ventricular internal cavity size was moderately dilated. There is mild concentric left ventricular hypertrophy. Left ventricular diastolic parameters are indeterminate.  2. Right ventricular systolic function is normal. The right ventricular size is normal. Tricuspid regurgitation signal is inadequate for assessing PA pressure.  3. Left atrial size was moderately dilated.  4. Large pleural effusion in the left lateral region.  5. The  mitral valve is normal in structure. Mild to moderate mitral valve regurgitation. No evidence of mitral stenosis.  6. The aortic valve is tricuspid. There is mild calcification of the aortic valve. Aortic valve regurgitation is trivial. Aortic valve sclerosis/calcification is present, without any evidence of aortic stenosis.  7. The inferior vena cava is dilated in size with <50% respiratory variability, suggesting right atrial pressure of 15 mmHg. FINDINGS  Left Ventricle: Left ventricular ejection fraction, by estimation, is <20%. The left ventricle has severely decreased function. The left ventricle demonstrates global hypokinesis. Definity  contrast agent was given IV to delineate the left ventricular endocardial borders. The left ventricular internal cavity size was moderately dilated. There is mild concentric left ventricular hypertrophy. Left ventricular diastolic parameters are indeterminate. Right Ventricle: The right ventricular size is normal. No increase in right ventricular wall thickness. Right ventricular systolic function is normal. Tricuspid regurgitation signal is inadequate for assessing PA pressure. Left Atrium: Left atrial size was moderately dilated. Right Atrium: Right atrial size was normal in size. Pericardium: There is no evidence of pericardial effusion. Mitral Valve: The mitral valve is normal in structure. Mild to moderate mitral valve regurgitation. No evidence of mitral valve stenosis. Tricuspid Valve: The tricuspid  valve is normal in structure. Tricuspid valve regurgitation is trivial. No evidence of tricuspid stenosis. Aortic Valve: The aortic valve is tricuspid. There is mild calcification of the aortic valve. Aortic valve regurgitation is trivial. Aortic valve sclerosis/calcification is present, without any evidence of aortic stenosis. Pulmonic Valve: The pulmonic valve was normal in structure. Pulmonic valve regurgitation is not visualized. No evidence of pulmonic stenosis. Aorta: The aortic root is normal in size and structure. Venous: The inferior vena cava is dilated in size with less than 50% respiratory variability, suggesting right atrial pressure of 15 mmHg. IAS/Shunts: No atrial level shunt detected by color flow Doppler. Additional Comments: There is a large pleural effusion in the left lateral region.  LEFT VENTRICLE PLAX 2D LVIDd:         6.00 cm      Diastology LVIDs:         5.10 cm      LV e' medial:    7.29 cm/s LV PW:         1.10 cm      LV E/e' medial:  13.4 LV IVS:        1.00 cm      LV e' lateral:   10.30 cm/s LVOT diam:     2.10 cm      LV E/e' lateral: 9.5 LV SV:         46 LV SV Index:   22 LVOT Area:     3.46 cm LV IVRT:       77 msec  LV Volumes (MOD) LV vol d, MOD A2C: 132.5 ml LV vol d, MOD A4C: 188.0 ml LV vol s, MOD A2C: 97.3 ml LV vol s, MOD A4C: 132.0 ml LV SV MOD A2C:     35.2 ml LV SV MOD A4C:     188.0 ml LV SV MOD BP:      41.2 ml RIGHT VENTRICLE            IVC RV Basal diam:  3.10 cm    IVC diam: 2.20 cm RV S prime:     6.64 cm/s TAPSE (M-mode): 1.7 cm LEFT ATRIUM              Index  RIGHT ATRIUM           Index LA diam:        4.80 cm  2.23 cm/m   RA Area:     13.60 cm LA Vol (A2C):   63.0 ml  29.23 ml/m  RA Volume:   36.60 ml  16.98 ml/m LA Vol (A4C):   103.0 ml 47.79 ml/m LA Biplane Vol: 81.1 ml  37.63 ml/m  AORTIC VALVE LVOT Vmax:   76.60 cm/s LVOT Vmean:  61.900 cm/s LVOT VTI:    0.134 m  AORTA Ao Root diam: 3.50 cm Ao Asc diam:  3.30 cm MITRAL VALVE MV Area (PHT):  6.48 cm    SHUNTS MV Decel Time: 117 msec    Systemic VTI:  0.13 m MV E velocity: 97.50 cm/s  Systemic Diam: 2.10 cm MV A velocity: 61.80 cm/s MV E/A ratio:  1.58 Toribio Fuel MD Electronically signed by Toribio Fuel MD Signature Date/Time: 05/13/2024/5:06:04 PM    Final    DG Chest Port 1 View Result Date: 05/13/2024 EXAM: 1 VIEW(S) XRAY OF THE CHEST 05/13/2024 12:52:48 AM COMPARISON: None available. CLINICAL HISTORY: sob FINDINGS: LUNGS AND PLEURA: Low lung volumes. Layering bilateral pleural effusions. Bibasilar airspace opacities. No pneumothorax. HEART AND MEDIASTINUM: Cardiomegaly. BONES AND SOFT TISSUES: No acute osseous abnormality. IMPRESSION: 1. Layering bilateral pleural effusions with bibasilar atelectasis or pneumonia. Electronically signed by: Norman Gatlin MD 05/13/2024 01:02 AM EST RP Workstation: [id number]   Assessment and Plan:  GREEN QUINCY is a 68 y.o. 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    ADHF HFrEF (LVEF 20%) ICM Symptomatic from ADHF with still reasonable UOP the past 24 h (2300) but with increased intake (1600) with lasix  40 mg IV bid. Will increase to lasix  80 mg IV bid today. Wt doesn't seem to reflect IO (+5 kg today despite net negative).  Continues on heparin  gtt with mild increase in sCr (2.09 from 1.9) although BUN stable at 29. Low K at 3.2, will add on Mg. Supplemented K.  Hemoglobin remained stable.  Plan to continue diuresis today and will consider PCI of his calcified LAD which would likely require a fair amount of contrast and potentially lithotripsy.  We discussed this today and he is  agreeable to proceed if we think that it could benefit his heart function.  Will discuss further with the cath team. He understands the risk to worsening renal fxn if we proceed. Will see how he is doing 12/29 AM from a volume status as I think we still have some fluid to get off based on his previous dry wt and current LE edema and JVP.  - GDMT: continue Jardiance  10 mg PO daily, increase spiro 25 mg PO daily to 50 mg PO daily, resume OP toprol  XL 50 mg daily, hold entresto  until after possible cath and stabilization of renal fxn (OP entresto  49-51 mg PO bid) - increase lasix  40 mg IV bid to 80 mg IV bid today  Informed Consent   Shared Decision Making/Informed Consent The risks [stroke (1 in 1000), death (1 in 1000), kidney failure [usually temporary] (1 in 500), bleeding (1 in 200), allergic  reaction [possibly serious] (1 in 200)], benefits (diagnostic support and management of coronary artery disease) and alternatives of a cardiac catheterization were discussed in detail with Mr. Birchall and he is willing to proceed.    good urine output yesterday with Lasix  40 mg IV bid, net - 3871.  He still has a long way to go in terms of getting closer to his dry weight.  Has significant peripheral edema and is dyspneic at rest.  Further complicating things as he has blood clot in his stool on heparin  gtt. Will monitor Hb over the weekend, 11.7 today and stable from prior. Possibly hemorrhoidal although no hx. On ASA 81 mg daily, jardiance  10 mg daily, lasix  40 mg IV bid, spiro 20 mg daily. Holding BB as he likely has a component of low output HF. Labs otherwise stable. Continue diuresis and hopefully can make some improvement over the weekend. Interventional team considering PCI of his calcified LAD however need to get him more euvolemic so that he can at least tolerate cath.   For questions or updates, please contact [facility] Please consult [email or web address] for contact info under   Signed, Donnice DELENA Primus, MD  05/16/2024 9:17 AM     [1]  Allergies Allergen Reactions   Erythromycin Nausea And Vomiting   Nsaids Other (See Comments)    Contraindication due to CKD    Onion Nausea And Vomiting   "

## 2024-05-16 NOTE — Progress Notes (Signed)
 PHARMACY - ANTICOAGULATION  Pharmacy Consult for Heparin  Indication: chest pain/ACS Brief A/P: Heparin  level supratherapeutic Decrease Heparin  rate  Allergies[1]  Patient Measurements: Height: 5' 6 (167.6 cm) Weight: 106.7 kg (235 lb 3.2 oz) IBW/kg (Calculated) : 63.8 HEPARIN  DW (KG): 86.2  Vital Signs: Temp: 98 F (36.7 C) (12/28 0116) Temp Source: Oral (12/28 0116) BP: 151/72 (12/28 0116) Pulse Rate: 102 (12/27 2042)  Labs: Recent Labs    05/14/24 0328 05/14/24 0738 05/15/24 0320 05/15/24 1302 05/16/24 0259  HGB 11.4*  --  11.7*  --  11.3*  HCT 35.5*  --  36.4*  --  35.5*  PLT 191  --  199  --  180  HEPARINUNFRC 0.59  --  0.77* 0.63 0.81*  CREATININE  --  1.81* 1.91*  --  2.09*    Estimated Creatinine Clearance: 38.8 mL/min (A) (by C-G formula based on SCr of 2.09 mg/dL (H)).  Assessment: 68 y.o. male with CHF and elevated troponin for heparin  Goal of Therapy:  Heparin  level 0.3-0.7 units/ml Monitor platelets by anticoagulation protocol: Yes   Plan:  Decrease Heparin  1200 units/hr  Loukas Antonson, Cordella Misty 05/16/2024,4:10 AM       [1]  Allergies Allergen Reactions   Erythromycin Nausea And Vomiting   Nsaids Other (See Comments)    Contraindication due to CKD    Onion Nausea And Vomiting

## 2024-05-16 NOTE — Plan of Care (Signed)
   Problem: Education: Goal: Ability to describe self-care measures that may prevent or decrease complications (Diabetes Survival Skills Education) will improve Outcome: Progressing   Problem: Coping: Goal: Ability to adjust to condition or change in health will improve Outcome: Progressing   Problem: Fluid Volume: Goal: Ability to maintain a balanced intake and output will improve Outcome: Progressing   Problem: Education: Goal: Knowledge of General Education information will improve Description: Including pain rating scale, medication(s)/side effects and non-pharmacologic comfort measures Outcome: Progressing

## 2024-05-16 NOTE — Progress Notes (Signed)
 Patient with heparin  infusion currently attached to 22g PIV, assessed bilateral arms for new IV access as EMS site removed due to age and tenderness at catheter insertion site.  IV team consult placed.     05/16/24 1710  Unsuccessful Nursing Procedure/Treatment  Type of Nursing Procedure/Treatment Peripheral IV insertion  Number of attempts 0  Location of attempt assessed bilateral arms  Peripheral IV 05/13/24 22 G 2.5 Anterior;Left Forearm  Placement Date/Time: 05/13/24 1825   Ultrasound Used?: Yes, depth greater than 5mm  Size (Gauge): 22 G  Size (Length): 2.5  Orientation: Anterior;Left  Location: Forearm  Site Prep: Chlorhexidine  (preferred);Skin Prep Completely Dry at the Time of Fi...  Site Assessment Clean, Dry, Intact  Line Status Infusing  Dressing Type Transparent  Dressing Status Clean, Dry, Intact  Interventions Other (Comment) (resistance with flushing)

## 2024-05-16 NOTE — Plan of Care (Signed)

## 2024-05-16 NOTE — Progress Notes (Signed)
 " PROGRESS NOTE    Thomas Gardner  FMW:[id number] DOB: 1956-05-03 DOA: 05/13/2024 PCP: Maree Leni Edyth DELENA, MD  Subjective: No acute events overnight. Seen and examined at bedside. Reports feeling slightly better with improvement in shortness of breath with exertion and bilateral leg edema.  Tolerating oral intake without n/v. Denies constipation.  Hospital Course:  As per H&P: 68 y.o. male with history of chronic HFrEF last EF measured on September 2025 was 35 to 40%, has had cardiac cath in October 2025 which showedDist LAD lesion is 80% stenosed,Prox RCA lesion is 100% stenosed, Prox LAD to Mid LAD lesion is 70% stenose, diabetes mellitus type 2, chronic disease stage III, gout, sleep apnea, history of prostate cancer, history of stroke presents to the ER because of increasing shortness of breath with exertion over the last 2 to 3 days.  Patient states he also has been having some orthopnea and has noticed some weight gain and lower extremity edema more than usual.  Over the last one week patient has been having productive cough denies any fever or chills.  He has been taking Lasix  despite which he was getting more short of breath and decided to come to the ER.  He also has been having chest tightness on exertion last few days.   Assessment and Plan:  Acute on chronic HFrEF  - last EF measured in September 2025 was 35 to 40%.  - TTE 12/25 showed LVEF < 20%, global hypokinesis, dilated LV, mild concentric LVH, RV size/systolic function normal, moderately dilated LA, mild to moderate MR, dilated IVC with <50% respiratory variability   - Patient takes Lasix  20 mg at home.  - cont lasix  40 mg IV BID - cont amiodarone  for arrhythmia suppression as per cardio recs - Continue jardiance  and spironolactone . - hold home metoprolol  and Entresto  - monitor I/Os, daily weights, renal function, electrolytes - monitor on tele - will get venous US  bilateral legs to rule out DVT - cardiology following    Elevated troponin NSTEMI  - concern for non-ST elevation MI given the recent cardiac cath showing severe disease that was not stented with plan to manage medically (Dist LAD lesion is 80% stenosed, prox RCA lesion is 100% stenosed, prox LAD to Mid LAD lesion is 70% stenoses) - TTE showed LVEF < 20%, global hypokinesis, dilated LV, mild concentric LVH, RV size/systolic function normal, moderately dilated LA, mild to moderate MR, dilated IVC with <50% respiratory variability - as per patient, history of myalgias with statins - home repatha  on hold as non-formulary - hold home metoprolol  - cont heparin  infusion - continue with aspirin   - will need repeat cardiac cath once more optimized in terms of fluid status - monitor on tele - cardiology following   Acute on chronic kidney disease stage III  - creatinine 2.09 < 1.8, 2.06 on admission, baseline around 1.7 - Possible cardiorenal syndrome.  - IV diuresis as elsewhere.  - hold home entresto  - monitor I/Os, renal function   Anemia Iron  deficiency  - no signs/symptoms of acute bleed - Follow CBC  - iron  studies confirmed iron  deficiency - cont IV iron  5-day course in this CHFrEF patient   Presumed pneumonia  - patient presented with productive cough  - CXR showed bilateral pleural effusion with associated atelectasis vs PNA - procalcitonin not elevated - stopped empiric antibiotics  - monitor clinically   Diabetes mellitus type 2  - HGbA1c 6.8 - takes Lantus .  - hold home Mounjaro    -  cont Jardiance   - continue home dose of Lantus  with sliding scale coverage.    History of gout on allopurinol .   Prior history of stroke on aspirin  and Repatha .   Sleep apnea CPAP at bedtime.  DVT prophylaxis:   IV Heparin    Code Status: Full Code  Disposition Plan: home with HHPT Reason for continuing need for hospitalization: severity of illness  Objective: Vitals:   05/16/24 0116 05/16/24 0136 05/16/24 0424 05/16/24 0721  BP:  (!) 151/72  139/73 132/69  Pulse:   86 88  Resp: 20  18 18   Temp: 98 F (36.7 C)  98.5 F (36.9 C) 97.9 F (36.6 C)  TempSrc: Oral  Oral Oral  SpO2:    95%  Weight:  106.7 kg    Height:        Intake/Output Summary (Last 24 hours) at 05/16/2024 1025 Last data filed at 05/16/2024 0100 Gross per 24 hour  Intake 1391.18 ml  Output 2000 ml  Net -608.82 ml   Filed Weights   05/14/24 0500 05/15/24 0300 05/16/24 0136  Weight: 109.2 kg 101.3 kg 106.7 kg    Examination:  Physical Exam Vitals and nursing note reviewed.  Constitutional:      General: He is not in acute distress.    Appearance: He is ill-appearing.     Comments: Weak, frail  HENT:     Head: Normocephalic and atraumatic.  Cardiovascular:     Rate and Rhythm: Normal rate and regular rhythm.     Pulses: Normal pulses.     Heart sounds: Normal heart sounds.  Pulmonary:     Effort: Pulmonary effort is normal. No respiratory distress.     Breath sounds: Normal breath sounds. No wheezing or rales.  Abdominal:     General: Bowel sounds are normal. There is no distension.     Palpations: Abdomen is soft.     Tenderness: There is no abdominal tenderness.  Musculoskeletal:     Right lower leg: Edema present.     Left lower leg: Edema present.  Neurological:     Mental Status: He is alert.     Data Reviewed: I have personally reviewed following labs and imaging studies  CBC: Recent Labs  Lab 05/13/24 0051 05/13/24 0600 05/14/24 0328 05/15/24 0320 05/16/24 0259  WBC 11.8* 11.0* 8.5 8.5 6.1  NEUTROABS  --  9.0*  --   --   --   HGB 11.7* 12.5* 11.4* 11.7* 11.3*  HCT 36.8* 40.0 35.5* 36.4* 35.5*  MCV 82.3 84.0 80.9 81.6 82.0  PLT 198 206 191 199 180   Basic Metabolic Panel: Recent Labs  Lab 05/13/24 0051 05/13/24 0600 05/14/24 0738 05/15/24 0320 05/16/24 0259  NA 141 142 139 140 138  K 3.7 3.8 3.5 3.7 3.2*  CL 107 107 105 104 104  CO2 23 23 23 28 25   GLUCOSE 200* 110* 98 58* 133*  BUN 27* 27*  29* 29* 29*  CREATININE 2.06* 2.04* 1.81* 1.91* 2.09*  CALCIUM  8.0* 8.4* 8.2* 8.2* 8.0*  MG  --  2.1 2.1  --   --    GFR: Estimated Creatinine Clearance: 38.8 mL/min (A) (by C-G formula based on SCr of 2.09 mg/dL (H)). Liver Function Tests: Recent Labs  Lab 05/13/24 0600  AST 93*  ALT 23  ALKPHOS 104  BILITOT 0.4  PROT 6.4*  ALBUMIN 3.2*   No results for input(s): LIPASE, AMYLASE in the last 168 hours. No results for input(s): AMMONIA in the last  168 hours. Coagulation Profile: No results for input(s): INR, PROTIME in the last 168 hours. Cardiac Enzymes: No results for input(s): CKTOTAL, CKMB, CKMBINDEX, TROPONINI in the last 168 hours. ProBNP, BNP (last 5 results) Recent Labs    01/13/24 1208 01/30/24 1340 05/13/24 0051  PROBNP  --   --  8,752.0*  BNP 170.3* 119.0*  --    HbA1C: No results for input(s): HGBA1C in the last 72 hours. CBG: Recent Labs  Lab 05/15/24 1705 05/15/24 2109 05/16/24 0423 05/16/24 0646 05/16/24 0649  GLUCAP 103* 140* 99 136* 136*   Lipid Profile: No results for input(s): CHOL, HDL, LDLCALC, TRIG, CHOLHDL, LDLDIRECT in the last 72 hours. Thyroid Function Tests: No results for input(s): TSH, T4TOTAL, FREET4, T3FREE, THYROIDAB in the last 72 hours. Anemia Panel: Recent Labs    05/13/24 1311  FERRITIN 740*  TIBC 200*  IRON  20*   Sepsis Labs: Recent Labs  Lab 05/13/24 0600  PROCALCITON 0.60    Recent Results (from the past 240 hours)  Resp panel by RT-PCR (RSV, Flu A&B, Covid) Anterior Nasal Swab     Status: None   Collection Time: 05/13/24  3:33 AM   Specimen: Anterior Nasal Swab  Result Value Ref Range Status   SARS Coronavirus 2 by RT PCR NEGATIVE NEGATIVE Final   Influenza A by PCR NEGATIVE NEGATIVE Final   Influenza B by PCR NEGATIVE NEGATIVE Final    Comment: (NOTE) The Xpert Xpress SARS-CoV-2/FLU/RSV plus assay is intended as an aid in the diagnosis of influenza from  Nasopharyngeal swab specimens and should not be used as a sole basis for treatment. Nasal washings and aspirates are unacceptable for Xpert Xpress SARS-CoV-2/FLU/RSV testing.  Fact Sheet for Patients: bloggercourse.com  Fact Sheet for Healthcare Providers: seriousbroker.it  This test is not yet approved or cleared by the [place]  FDA and has been authorized for detection and/or diagnosis of SARS-CoV-2 by FDA under an Emergency Use Authorization (EUA). This EUA will remain in effect (meaning this test can be used) for the duration of the COVID-19 declaration under Section 564(b)(1) of the Act, 21 U.S.C. section 360bbb-3(b)(1), unless the authorization is terminated or revoked.     Resp Syncytial Virus by PCR NEGATIVE NEGATIVE Final    Comment: (NOTE) Fact Sheet for Patients: bloggercourse.com  Fact Sheet for Healthcare Providers: seriousbroker.it  This test is not yet approved or cleared by the [place]  FDA and has been authorized for detection and/or diagnosis of SARS-CoV-2 by FDA under an Emergency Use Authorization (EUA). This EUA will remain in effect (meaning this test can be used) for the duration of the COVID-19 declaration under Section 564(b)(1) of the Act, 21 U.S.C. section 360bbb-3(b)(1), unless the authorization is terminated or revoked.  Performed at [facility], [address] [place], [place] [address]      Radiology Studies: No results found.  Scheduled Meds:  allopurinol   100 mg Oral Daily   amiodarone   400 mg Oral BID   aspirin  EC  81 mg Oral Daily   empagliflozin   10 mg Oral Daily   furosemide   80 mg Intravenous BID   insulin  aspart  0-9 Units Subcutaneous TID WC   metoprolol  succinate  50 mg Oral Daily   potassium chloride   40 mEq Oral Q4H   [START ON 05/17/2024] spironolactone   50 mg Oral Daily   tamsulosin   0.4 mg Oral Daily    Continuous Infusions:  heparin  1,200 Units/hr (05/16/24 0415)   iron  sucrose Stopped (05/15/24 1437)  LOS: 3 days   Norval Bar, MD  [facility]  05/16/2024, 10:25 AM   "

## 2024-05-16 NOTE — Progress Notes (Signed)
 Mobility Specialist Progress Note:   05/16/24 1100  Mobility  Activity Ambulated with assistance  Level of Assistance Contact guard assist, steadying assist  Assistive Device Front wheel walker  Distance Ambulated (ft) 20 ft (x2)  Activity Response Tolerated well  Mobility Referral Yes  Mobility visit 1 Mobility  Mobility Specialist Start Time (ACUTE ONLY) 1000  Mobility Specialist Stop Time (ACUTE ONLY) 1014  Mobility Specialist Time Calculation (min) (ACUTE ONLY) 14 min   Pt received in bed agreeable to mobility. No physical assistance needed just contact guard for safety d/t shakiness in BLE. C/o fatigue during session, otherwise tolerated well. Distance limited d/t fatigue. Returned to room w/o fault. Left seated EOB w/ call bell and personal belongings in reach. All needs met.   Thersia Minder Mobility Specialist  Please contact vis Secure Chat or  Rehab Office [phone]

## 2024-05-17 ENCOUNTER — Other Ambulatory Visit: Payer: Self-pay

## 2024-05-17 ENCOUNTER — Inpatient Hospital Stay (HOSPITAL_COMMUNITY)

## 2024-05-17 ENCOUNTER — Encounter (HOSPITAL_BASED_OUTPATIENT_CLINIC_OR_DEPARTMENT_OTHER): Payer: Self-pay

## 2024-05-17 ENCOUNTER — Encounter (HOSPITAL_BASED_OUTPATIENT_CLINIC_OR_DEPARTMENT_OTHER)

## 2024-05-17 DIAGNOSIS — M7989 Other specified soft tissue disorders: Secondary | ICD-10-CM | POA: Diagnosis not present

## 2024-05-17 DIAGNOSIS — I5023 Acute on chronic systolic (congestive) heart failure: Secondary | ICD-10-CM | POA: Diagnosis not present

## 2024-05-17 LAB — HEPARIN LEVEL (UNFRACTIONATED): Heparin Unfractionated: 0.7 [IU]/mL (ref 0.30–0.70)

## 2024-05-17 LAB — CBC
HCT: 37.3 % — ABNORMAL LOW (ref 39.0–52.0)
Hemoglobin: 11.7 g/dL — ABNORMAL LOW (ref 13.0–17.0)
MCH: 25.8 pg — ABNORMAL LOW (ref 26.0–34.0)
MCHC: 31.4 g/dL (ref 30.0–36.0)
MCV: 82.3 fL (ref 80.0–100.0)
Platelets: 203 K/uL (ref 150–400)
RBC: 4.53 MIL/uL (ref 4.22–5.81)
RDW: 15 % (ref 11.5–15.5)
WBC: 7.1 K/uL (ref 4.0–10.5)
nRBC: 0 % (ref 0.0–0.2)

## 2024-05-17 LAB — COOXEMETRY PANEL
Carboxyhemoglobin: 1.3 % (ref 0.5–1.5)
Carboxyhemoglobin: 0.5 % (ref 0.5–1.5)
Methemoglobin: <0.7 % (ref 0.0–1.5)
Methemoglobin: <0.7 % (ref 0.0–1.5)
O2 Saturation: 59.1 %
O2 Saturation: 46.1 %
Total hemoglobin: 11.6 g/dL — ABNORMAL LOW (ref 12.0–16.0)
Total hemoglobin: 12.2 g/dL (ref 12.0–16.0)

## 2024-05-17 LAB — BASIC METABOLIC PANEL WITH GFR
Anion gap: 10 (ref 5–15)
BUN: 36 mg/dL — ABNORMAL HIGH (ref 8–23)
CO2: 25 mmol/L (ref 22–32)
Calcium: 8.4 mg/dL — ABNORMAL LOW (ref 8.9–10.3)
Chloride: 105 mmol/L (ref 98–111)
Creatinine, Ser: 2.32 mg/dL — ABNORMAL HIGH (ref 0.61–1.24)
GFR, Estimated: 30 mL/min — ABNORMAL LOW
Glucose, Bld: 104 mg/dL — ABNORMAL HIGH (ref 70–99)
Potassium: 4.1 mmol/L (ref 3.5–5.1)
Sodium: 140 mmol/L (ref 135–145)

## 2024-05-17 LAB — LACTIC ACID, PLASMA: Lactic Acid, Venous: 0.6 mmol/L (ref 0.5–1.9)

## 2024-05-17 LAB — GLUCOSE, CAPILLARY
Glucose-Capillary: 93 mg/dL (ref 70–99)
Glucose-Capillary: 90 mg/dL (ref 70–99)
Glucose-Capillary: 191 mg/dL — ABNORMAL HIGH (ref 70–99)
Glucose-Capillary: 180 mg/dL — ABNORMAL HIGH (ref 70–99)

## 2024-05-17 MED ORDER — FUROSEMIDE 10 MG/ML IJ SOLN
120.0000 mg | Freq: Two times a day (BID) | INTRAVENOUS | Status: DC
  Administered 2024-05-17 – 2024-05-18 (×2): 120 mg via INTRAVENOUS
  Filled 2024-05-17: qty 10
  Filled 2024-05-17: qty 12
  Filled 2024-05-17: qty 10

## 2024-05-17 MED ORDER — AMIODARONE HCL IN DEXTROSE 360-4.14 MG/200ML-% IV SOLN
30.0000 mg/h | INTRAVENOUS | Status: DC
  Administered 2024-05-17 – 2024-05-19 (×6): 30 mg/h via INTRAVENOUS
  Filled 2024-05-17 (×6): qty 200

## 2024-05-17 MED ORDER — ENOXAPARIN SODIUM 40 MG/0.4ML IJ SOSY
40.0000 mg | PREFILLED_SYRINGE | Freq: Every day | INTRAMUSCULAR | Status: DC
  Administered 2024-05-18 – 2024-05-21 (×4): 40 mg via SUBCUTANEOUS
  Filled 2024-05-17 (×5): qty 0.4

## 2024-05-17 MED ORDER — SODIUM CHLORIDE 0.9% FLUSH
10.0000 mL | Freq: Two times a day (BID) | INTRAVENOUS | Status: DC
  Administered 2024-05-17 – 2024-05-19 (×5): 10 mL
  Administered 2024-05-20: 20 mL
  Administered 2024-05-20 – 2024-05-21 (×2): 10 mL

## 2024-05-17 MED ORDER — METOLAZONE 2.5 MG PO TABS
2.5000 mg | ORAL_TABLET | Freq: Once | ORAL | Status: AC
  Administered 2024-05-17: 2.5 mg via ORAL
  Filled 2024-05-17: qty 1

## 2024-05-17 MED ORDER — CHLORHEXIDINE GLUCONATE CLOTH 2 % EX PADS
6.0000 | MEDICATED_PAD | Freq: Every day | CUTANEOUS | Status: DC
  Administered 2024-05-17 – 2024-05-20 (×4): 6 via TOPICAL

## 2024-05-17 MED ORDER — ISOSORB DINITRATE-HYDRALAZINE 20-37.5 MG PO TABS
1.0000 | ORAL_TABLET | Freq: Three times a day (TID) | ORAL | Status: DC
  Administered 2024-05-17 – 2024-05-19 (×9): 1 via ORAL
  Filled 2024-05-17 (×9): qty 1

## 2024-05-17 MED ORDER — SODIUM CHLORIDE 0.9% FLUSH
10.0000 mL | INTRAVENOUS | Status: DC | PRN
  Administered 2024-05-19: 10 mL

## 2024-05-17 MED ORDER — FUROSEMIDE 10 MG/ML IJ SOLN
40.0000 mg | INTRAMUSCULAR | Status: AC
  Administered 2024-05-17: 40 mg via INTRAVENOUS
  Filled 2024-05-17: qty 4

## 2024-05-17 MED ORDER — SENNA 8.6 MG PO TABS
1.0000 | ORAL_TABLET | Freq: Every day | ORAL | Status: DC | PRN
  Administered 2024-05-19: 8.6 mg via ORAL
  Filled 2024-05-17: qty 1

## 2024-05-17 MED ORDER — SPIRONOLACTONE 25 MG PO TABS: 25.0000 mg | ORAL_TABLET | Freq: Every day | ORAL | Status: DC

## 2024-05-17 MED ORDER — ATORVASTATIN CALCIUM 80 MG PO TABS
80.0000 mg | ORAL_TABLET | Freq: Every day | ORAL | Status: DC
  Administered 2024-05-17 – 2024-05-21 (×5): 80 mg via ORAL
  Filled 2024-05-17 (×5): qty 1

## 2024-05-17 MED ORDER — MILRINONE LACTATE IN DEXTROSE 20-5 MG/100ML-% IV SOLN
0.1250 ug/kg/min | INTRAVENOUS | Status: DC
  Administered 2024-05-17 – 2024-05-19 (×3): 0.125 ug/kg/min via INTRAVENOUS
  Filled 2024-05-17 (×3): qty 100

## 2024-05-17 NOTE — Progress Notes (Signed)
 Orthopedic Tech Progress Note Patient Details:  Thomas Gardner [birth year] [id number]  Ortho Devices Type of Ortho Device: Radio broadcast assistant Ortho Device/Splint Location: BLE Ortho Device/Splint Interventions: Ordered, Application   Post Interventions Patient Tolerated: Well  [name] [name] 05/17/2024, 4:58 PM

## 2024-05-17 NOTE — Progress Notes (Signed)
 Mobility Specialist Progress Note:    05/17/24 0940  Mobility  Activity Ambulated with assistance  Level of Assistance Contact guard assist, steadying assist  Assistive Device Other (Comment) (IV Pole)  Distance Ambulated (ft) 25 ft  Range of Motion/Exercises Active  Activity Response Tolerated fair  Mobility Referral Yes  Mobility visit 1 Mobility  Mobility Specialist Start Time (ACUTE ONLY) 0940  Mobility Specialist Stop Time (ACUTE ONLY) 0947  Mobility Specialist Time Calculation (min) (ACUTE ONLY) 7 min   Received pt sitting EOB agreeable to session. C/o swelling in feet causing discomfort but otherwise not doing too bad. Pt able to move and ambulate decently but slowly. Pt tok one standing break before returning back to room. Returned pt to EOB w/ all needs met.   Venetia Keel Mobility Specialist Please Neurosurgeon or Rehab Office at [phone]

## 2024-05-17 NOTE — Plan of Care (Signed)

## 2024-05-17 NOTE — Progress Notes (Signed)
 BLE venous duplex has been completed.   Results can be found under chart review under CV PROC. 05/17/2024 5:21 PM Madolyn Ackroyd RVT, RDMS

## 2024-05-17 NOTE — Progress Notes (Signed)
 PHARMACY - ANTICOAGULATION  Pharmacy Consult for Heparin  Indication: chest pain/ACS   Allergies[1]  Patient Measurements: Height: 5' 6 (167.6 cm) Weight: 105.6 kg (232 lb 12.9 oz) IBW/kg (Calculated) : 63.8 HEPARIN  DW (KG): 87.5  Vital Signs: Temp: 98 F (36.7 C) (12/29 0725) Temp Source: Oral (12/29 0725) BP: 131/66 (12/29 0725) Pulse Rate: 83 (12/29 0725)  Labs: Recent Labs    05/15/24 0320 05/15/24 1302 05/16/24 0259 05/17/24 0253  HGB 11.7*  --  11.3* 11.7*  HCT 36.4*  --  35.5* 37.3*  PLT 199  --  180 203  HEPARINUNFRC 0.77* 0.63 0.81* 0.70  CREATININE 1.91*  --  2.09* 2.32*    Estimated Creatinine Clearance: 34.7 mL/min (A) (by C-G formula based on SCr of 2.32 mg/dL (H)).  Assessment: 68 y.o. male with CHF and elevated troponin on heparin  drip 1200 uts/hr  With heparin  level 0.7 at top of goal - no plans for revascularization  Will stop heparin  drip ? VTE px CBC ok   Goal of Therapy:  Heparin  level 0.3-0.7 units/ml Monitor platelets by anticoagulation protocol: Yes   Plan:  Stop heparin  drip    Olam Chalk Pharm.D. CPP, BCPS Clinical Pharmacist [phone] 05/17/2024 8:24 AM         [1]  Allergies Allergen Reactions   Erythromycin Nausea And Vomiting   Nsaids Other (See Comments)    Contraindication due to CKD    Onion Nausea And Vomiting

## 2024-05-17 NOTE — Progress Notes (Signed)
 Heart Failure Navigator Progress Note  Assessed for Heart & Vascular TOC clinic readiness.  Patient does not meet criteria due to Advanced Heart Failure Team consulted..   Navigator will sign off at this time.    Stephane Haddock, BSN, Scientist, clinical (histocompatibility and immunogenetics) Only

## 2024-05-17 NOTE — Plan of Care (Signed)
  Problem: Education: Goal: Ability to describe self-care measures that may prevent or decrease complications (Diabetes Survival Skills Education) will improve Outcome: Progressing   Problem: Coping: Goal: Ability to adjust to condition or change in health will improve Outcome: Progressing   Problem: Activity: Goal: Risk for activity intolerance will decrease Outcome: Progressing   

## 2024-05-17 NOTE — Progress Notes (Signed)
 " PROGRESS NOTE    Thomas Gardner  FMW:[id number] DOB: 1956/04/26 DOA: 05/13/2024 PCP: Maree Leni Edyth DELENA, MD  Subjective: No acute events overnight. Seen and examined at bedside. Reports feeling slightly better with improvement in shortness of breath with exertion and bilateral leg edema. Still feeling fatigued. Tolerating oral intake without n/v. Denies constipation.    Hospital Course:  As per H&P: 68 y.o. male with history of chronic HFrEF last EF measured on September 2025 was 35 to 40%, has had cardiac cath in October 2025 which showedDist LAD lesion is 80% stenosed,Prox RCA lesion is 100% stenosed, Prox LAD to Mid LAD lesion is 70% stenose, diabetes mellitus type 2, chronic disease stage III, gout, sleep apnea, history of prostate cancer, history of stroke presents to the ER because of increasing shortness of breath with exertion over the last 2 to 3 days.  Patient states he also has been having some orthopnea and has noticed some weight gain and lower extremity edema more than usual.  Over the last one week patient has been having productive cough denies any fever or chills.  He has been taking Lasix  despite which he was getting more short of breath and decided to come to the ER.  He also has been having chest tightness on exertion last few days.   Assessment and Plan:  Acute on chronic HFrEF  - last EF measured in September 2025 was 35 to 40%.  - TTE 12/25 showed LVEF < 20%, global hypokinesis, dilated LV, mild concentric LVH, RV size/systolic function normal, moderately dilated LA, mild to moderate MR, dilated IVC with <50% respiratory variability   - Patient takes Lasix  20 mg at home.  - start milrinone  drip - increase lasix  40 to 120mg  IV BID - given metolazine 2.5mg  once - stop amiodarone  as per cardio recs - Continue jardiance   - stop spironolactone  given AKI - hold home metoprolol  and Entresto  - s/p PICC for CVP monitoring - monitor I/Os, daily weights, renal function,  electrolytes - monitor on tele - HF team following - cardiology following   Elevated troponin NSTEMI  - concern for non-ST elevation MI given the recent cardiac cath showing severe disease that was not stented with plan to manage medically (Dist LAD lesion is 80% stenosed, prox RCA lesion is 100% stenosed, prox LAD to Mid LAD lesion is 70% stenoses) - TTE showed LVEF < 20%, global hypokinesis, dilated LV, mild concentric LVH, RV size/systolic function normal, moderately dilated LA, mild to moderate MR, dilated IVC with <50% respiratory variability - as per patient, history of myalgias with statins - start atorvastatin  as per cardio recs - home repatha  on hold as non-formulary - hold home metoprolol  - stop heparin  infusion as per cardio recs - continue with aspirin   - will need repeat cardiac cath once more optimized in terms of fluid status - monitor on tele - cardiology following   Acute on chronic kidney disease stage III  - creatinine 2.32 < 1.8, 2.06 on admission, baseline around 1.7 - Possible cardiorenal syndrome.  - IV diuresis as elsewhere.  - hold home entresto  - monitor I/Os, renal function    Anemia Iron  deficiency  - no signs/symptoms of acute bleed - Follow CBC  - iron  studies confirmed iron  deficiency - cont IV iron  5-day course in this CHFrEF patient   Presumed pneumonia  - patient presented with productive cough  - CXR showed bilateral pleural effusion with associated atelectasis vs PNA - procalcitonin not elevated - stopped empiric  antibiotics  - monitor clinically   Diabetes mellitus type 2  - HGbA1c 6.8 - takes Lantus .  - hold home Mounjaro    - cont Jardiance   - continue home dose of Lantus  with sliding scale coverage.    History of gout on allopurinol .   Prior history of stroke on aspirin  and Repatha .   Sleep apnea CPAP at bedtime.  DVT prophylaxis: enoxaparin  (LOVENOX ) injection 40 mg Start: 05/18/24 1000    Code Status: Full  Code  Disposition Plan: TBD Reason for continuing need for hospitalization: severity of illness  Objective: Vitals:   05/16/24 2300 05/17/24 0300 05/17/24 0725 05/17/24 1145  BP: 129/73 121/69 131/66 (!) 146/80  Pulse: 82 80 83 83  Resp: 18 18 20 20   Temp: 98.1 F (36.7 C) 98 F (36.7 C) 98 F (36.7 C) 97.8 F (36.6 C)  TempSrc: Oral Oral Oral Oral  SpO2: 97% 94% 95% 100%  Weight:  105.6 kg    Height:  5' 6 (1.676 m)      Intake/Output Summary (Last 24 hours) at 05/17/2024 1355 Last data filed at 05/17/2024 1100 Gross per 24 hour  Intake 845.9 ml  Output 3775 ml  Net -2929.1 ml   Filed Weights   05/15/24 0300 05/16/24 0136 05/17/24 0300  Weight: 101.3 kg 106.7 kg 105.6 kg    Examination:  Physical Exam Vitals and nursing note reviewed.  Constitutional:      General: He is not in acute distress.    Appearance: He is obese. He is ill-appearing.     Comments: Weak, frail  HENT:     Head: Normocephalic and atraumatic.  Cardiovascular:     Rate and Rhythm: Normal rate and regular rhythm.     Pulses: Normal pulses.     Heart sounds: Normal heart sounds.  Pulmonary:     Effort: Pulmonary effort is normal. No respiratory distress.     Breath sounds: Normal breath sounds. No wheezing.  Abdominal:     General: Bowel sounds are normal.     Palpations: Abdomen is soft.  Musculoskeletal:     Right lower leg: Edema present.     Left lower leg: Edema present.  Neurological:     Mental Status: He is alert.     Data Reviewed: I have personally reviewed following labs and imaging studies  CBC: Recent Labs  Lab 05/13/24 0600 05/14/24 0328 05/15/24 0320 05/16/24 0259 05/17/24 0253  WBC 11.0* 8.5 8.5 6.1 7.1  NEUTROABS 9.0*  --   --   --   --   HGB 12.5* 11.4* 11.7* 11.3* 11.7*  HCT 40.0 35.5* 36.4* 35.5* 37.3*  MCV 84.0 80.9 81.6 82.0 82.3  PLT 206 191 199 180 203   Basic Metabolic Panel: Recent Labs  Lab 05/13/24 0600 05/14/24 0738 05/15/24 0320  05/16/24 0259 05/17/24 0253  NA 142 139 140 138 140  K 3.8 3.5 3.7 3.2* 4.1  CL 107 105 104 104 105  CO2 23 23 28 25 25   GLUCOSE 110* 98 58* 133* 104*  BUN 27* 29* 29* 29* 36*  CREATININE 2.04* 1.81* 1.91* 2.09* 2.32*  CALCIUM  8.4* 8.2* 8.2* 8.0* 8.4*  MG 2.1 2.1  --  2.0  --    GFR: Estimated Creatinine Clearance: 34.7 mL/min (A) (by C-G formula based on SCr of 2.32 mg/dL (H)). Liver Function Tests: Recent Labs  Lab 05/13/24 0600  AST 93*  ALT 23  ALKPHOS 104  BILITOT 0.4  PROT 6.4*  ALBUMIN 3.2*  No results for input(s): LIPASE, AMYLASE in the last 168 hours. No results for input(s): AMMONIA in the last 168 hours. Coagulation Profile: No results for input(s): INR, PROTIME in the last 168 hours. Cardiac Enzymes: No results for input(s): CKTOTAL, CKMB, CKMBINDEX, TROPONINI in the last 168 hours. ProBNP, BNP (last 5 results) Recent Labs    01/13/24 1208 01/30/24 1340 05/13/24 0051  PROBNP  --   --  8,752.0*  BNP 170.3* 119.0*  --    HbA1C: No results for input(s): HGBA1C in the last 72 hours. CBG: Recent Labs  Lab 05/16/24 1617 05/16/24 2053 05/16/24 2118 05/17/24 0525 05/17/24 1147  GLUCAP 157* 105* 106* 93 90   Lipid Profile: No results for input(s): CHOL, HDL, LDLCALC, TRIG, CHOLHDL, LDLDIRECT in the last 72 hours. Thyroid Function Tests: No results for input(s): TSH, T4TOTAL, FREET4, T3FREE, THYROIDAB in the last 72 hours. Anemia Panel: No results for input(s): VITAMINB12, FOLATE, FERRITIN, TIBC, IRON , RETICCTPCT in the last 72 hours. Sepsis Labs: Recent Labs  Lab 05/13/24 0600 05/17/24 0949  PROCALCITON 0.60  --   LATICACIDVEN  --  0.6    Recent Results (from the past 240 hours)  Resp panel by RT-PCR (RSV, Flu A&B, Covid) Anterior Nasal Swab     Status: None   Collection Time: 05/13/24  3:33 AM   Specimen: Anterior Nasal Swab  Result Value Ref Range Status   SARS Coronavirus 2 by RT PCR  NEGATIVE NEGATIVE Final   Influenza A by PCR NEGATIVE NEGATIVE Final   Influenza B by PCR NEGATIVE NEGATIVE Final    Comment: (NOTE) The Xpert Xpress SARS-CoV-2/FLU/RSV plus assay is intended as an aid in the diagnosis of influenza from Nasopharyngeal swab specimens and should not be used as a sole basis for treatment. Nasal washings and aspirates are unacceptable for Xpert Xpress SARS-CoV-2/FLU/RSV testing.  Fact Sheet for Patients: bloggercourse.com  Fact Sheet for Healthcare Providers: seriousbroker.it  This test is not yet approved or cleared by the [place]  FDA and has been authorized for detection and/or diagnosis of SARS-CoV-2 by FDA under an Emergency Use Authorization (EUA). This EUA will remain in effect (meaning this test can be used) for the duration of the COVID-19 declaration under Section 564(b)(1) of the Act, 21 U.S.C. section 360bbb-3(b)(1), unless the authorization is terminated or revoked.     Resp Syncytial Virus by PCR NEGATIVE NEGATIVE Final    Comment: (NOTE) Fact Sheet for Patients: bloggercourse.com  Fact Sheet for Healthcare Providers: seriousbroker.it  This test is not yet approved or cleared by the [place]  FDA and has been authorized for detection and/or diagnosis of SARS-CoV-2 by FDA under an Emergency Use Authorization (EUA). This EUA will remain in effect (meaning this test can be used) for the duration of the COVID-19 declaration under Section 564(b)(1) of the Act, 21 U.S.C. section 360bbb-3(b)(1), unless the authorization is terminated or revoked.  Performed at [facility], [address], [place], [place] [address]      Radiology Studies: US  EKG SITE RITE Result Date: 05/17/2024 If Site Rite image not attached, placement could not be confirmed due to current cardiac rhythm.   Scheduled Meds:  allopurinol   100 mg Oral  Daily   aspirin  EC  81 mg Oral Daily   atorvastatin   80 mg Oral Daily   Chlorhexidine  Gluconate Cloth  6 each Topical Daily   empagliflozin   10 mg Oral Daily   [START ON 05/18/2024] enoxaparin  (LOVENOX ) injection  40 mg Subcutaneous Daily  insulin  aspart  0-9 Units Subcutaneous TID WC   isosorbide -hydrALAZINE   1 tablet Oral TID   sodium chloride  flush  10-40 mL Intracatheter Q12H   tamsulosin   0.4 mg Oral Daily   Continuous Infusions:  amiodarone  30 mg/hr (05/17/24 1246)   furosemide      iron  sucrose Stopped (05/16/24 1311)   milrinone  0.125 mcg/kg/min (05/17/24 1230)     LOS: 4 days   Norval Bar, MD  [facility]  05/17/2024, 1:55 PM   "

## 2024-05-17 NOTE — Consult Note (Addendum)
 "   Advanced Heart Failure Team Consult Note   Primary Physician: Maree Leni Edyth DELENA, MD Cardiologist:  Annabella Scarce, MD HPI:    Thomas Gardner is seen today for evaluation of A/C HFrEF with suspected low output HF at the request of Dr. Wendel.   Thomas Gardner is a 68 y.o. male with iCM, chronic HFrEF, unvascularized 2v CAD (pRCA CTA and 70% pLAD/mLAD, 80% dLAD), HTN, HLD, DMII, CKD stage 3, morbid obesity, PVCs, asthma, prostate cancer s/p radiation, OSA, CVA (10/22') and carotid stenosis.   - Myoview 13' LVEF 37%, no ischemia.  - Echo 7/22 LVEF 55-40%, GIDD  - Zio 8/24 with 9.6% PVC burden - Echo 9/25 EF 35-40%, LV GHK, mild LVH, RV normal, mild LAE, trivial MR.   8/25 seen in cards clinic. EF down to 35-40%. Volume overloaded on exam despite lasix  10 mg BID.   - CCTA 10/25 with calcium  score of 2810 with >70% stenosis in pRCA and mod 50-60% stenosis in prox-midLAD and LCx   - LHC 10/25: pRCA CTA and 70% pLAD/mLAD, 80% dLAD. Plan for medical management and GDMT optimization. Would be high risk PCI with possible lithotripsy.   Presented to the ED earlier this week with SOB, BLE edema, weight gain, abd fullness and PND. Noticed swelling in his legs around 1 month ago, had been on low dose PO lasix . Denies CP on admission just chest pressure/fullness. Reports compliance with all meds, follows regularly with cardiology and PCP. Had noticed his weight go up ~30 lbs in the last month despite gentle diuresis outpatient. Reports family history of CHF in his mom. Denies ETOH, tobacco use or illicit drug use. Works as a physicist, medical.   Admission labs reviewed: K 3.7, SCr 2.06, ProBNP 8.7K, HsTrop 145>482, EKG on admission ST 120s. Echo 05/13/24 EF <20%, LV with GHK, mild concentric LVH, RV normal, LA mod dilated, mild-mod MR, trivial TR. Diuresis has been started with IV lasix . AHF team consulted today with concern for low output HF. SCr tending up, cool lower extremities.   Feels  better but still unable to lay flat with constant rattling. Gets SOB with ambulation to the bathroom.   Home Medications Prior to Admission medications  Medication Sig Start Date End Date Taking? Authorizing Provider  acetaminophen  (TYLENOL ) 325 MG tablet Take 1-2 tablets (325-650 mg total) by mouth every 4 (four) hours as needed for mild pain. 03/14/21  Yes Angiulli, Toribio PARAS, PA-C  allopurinol  (ZYLOPRIM ) 100 MG tablet Take 1 tablet (100 mg total) by mouth daily. Patient taking differently: Take 50 mg by mouth 2 (two) times daily. 05/05/24  Yes   ascorbic acid  (VITAMIN C ) 500 MG tablet Take 1 tablet (500 mg total) by mouth daily. 04/27/24  Yes   aspirin  EC 81 MG tablet Take 1 tablet (81 mg total) by mouth daily. 02/16/24  Yes   empagliflozin  (JARDIANCE ) 25 MG TABS tablet Take 1 tablet (25 mg total) by mouth in the morning. Patient taking differently: Take 12.5 mg by mouth in the morning and at bedtime. 10/16/23  Yes   Evolocumab  (REPATHA  SURECLICK) 140 MG/ML SOAJ Inject 140 mg into the skin every 14 (fourteen) days. Please keep your upcoming appointment for refills. 03/23/24  Yes Scarce Annabella, MD  furosemide  (LASIX ) 20 MG tablet Take 1 tablet (20 mg total) by mouth daily. May take an additional tablet as needed for weight gain of 2 lbs overnight or 5 lbs in one week. 04/13/24  Yes Vannie Reche RAMAN, NP  insulin  glargine (LANTUS  SOLOSTAR) 100 UNIT/ML Solostar Pen Inject 16 Units into the skin every morning AND 20 Units at bedtime. 04/30/24  Yes   metoprolol  succinate (TOPROL -XL) 50 MG 24 hr tablet Take 1 tablet (50 mg total) by mouth at bedtime. Take with or immediately following a meal. 02/27/24  Yes Vannie Reche RAMAN, NP  sacubitril -valsartan  (ENTRESTO ) 49-51 MG Take 1 tablet by mouth 2 (two) times daily. 03/24/24  Yes Vannie Reche RAMAN, NP  spironolactone  (ALDACTONE ) 25 MG tablet Take 1 tablet (25 mg total) by mouth daily. Patient taking differently: Take 12.5 mg by mouth 2 (two) times daily.  03/22/24  Yes   tirzepatide  (MOUNJARO ) 2.5 MG/0.5ML Pen Inject 2.5 mg into the skin once a week. 12/18/23  Yes   vitamin D3 (CHOLECALCIFEROL ) 25 MCG tablet Take 1 tablet (1,000 Units total) by mouth daily. 04/27/24  Yes   Accu-Chek Softclix Lancets lancets Use to test Blood Sugars daily. 02/16/24     albuterol  (VENTOLIN  HFA) 108 (90 Base) MCG/ACT inhaler Inhale 1-2 puffs into the lungs every 6 (six) hours as needed. 05/14/24     Continuous Glucose Sensor (FREESTYLE LIBRE 3 SENSOR) MISC Use as directed for blood sugar monitoring at least three times daily and as needed Patient not taking: Reported on 05/13/2024 02/16/24     glucose blood (ACCU-CHEK GUIDE TEST) test strip Use to test Blood Sugars once daily 02/16/24     tamsulosin  (FLOMAX ) 0.4 MG CAPS capsule Take 1 capsule (0.4 mg total) by mouth daily. 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   nueruologist---- dr rosemarie;    04-10-2022  per pt residul at times balance impaired   Arthritis    back shoulders and legs   Asthma    Bilateral lower extremity edema    @ times, goes away with legs elevated   CHF (congestive heart failure) (HCC)    Complication of anesthesia    woke up with endoscopy yrs ago   Diabetes mellitus type 2, insulin  dependent (HCC) 2003   Does mobilize using cane    Familial hyperlipidemia 05/07/2021   GERD (gastroesophageal reflux disease)    History of hiatal hernia    Hyperlipidemia    Hypertension    OSA (obstructive sleep apnea)    did not tolerate cpap, uses pillow for elevation   Prostate cancer [facility])    Past Surgical History: Past Surgical History:  Procedure Laterality Date   BREATH TEK H PYLORI  11/11/2011   Procedure: BREATH TEK H PYLORI;  Surgeon: Alm VEAR Angle, MD;  Location: [facility];  Service: General;  Laterality: N/A;   CARDIAC CATHETERIZATION  2011   per pt done by  Dr Ladona at [facility],  told no bloackages   colonscopy  2018   CORONARY PRESSURE/FFR WITH 3D MAPPING N/A 03/11/2024   Procedure: Coronary Pressure/FFR w/3D Mapping;  Surgeon: Darron Deatrice LABOR, MD;  Location: [facility];  Service: Cardiovascular;  Laterality: N/A;   ESOPHAGOGASTRODUODENOSCOPY (EGD) WITH PROPOFOL  N/A 05/04/2014   Procedure: ESOPHAGOGASTRODUODENOSCOPY (EGD) WITH PROPOFOL ;  Surgeon: Lamar Donnald GAILS, MD;  Location[facility];  Service: Endoscopy;  Laterality: N/A;   GOLD SEED IMPLANT N/A 04/16/2022   Procedure: GOLD SEED IMPLANT;  Surgeon: Elisabeth Valli BIRCH, MD;  Location: [facility];  Service: Urology;  Laterality: N/A;   LEFT HEART CATH AND CORONARY  ANGIOGRAPHY N/A 03/11/2024   Procedure: LEFT HEART CATH AND CORONARY ANGIOGRAPHY;  Surgeon: Darron Deatrice LABOR, MD;  Location: [facility];  Service: Cardiovascular;  Laterality: N/A;   Family History: Family History  Problem Relation Age of Onset   Heart failure Mother    Cancer Mother        ovarian   Diabetes Mother    Stroke Father    Heart failure Sister    Heart failure Brother    Colon cancer Brother    Cancer Maternal Aunt        breast   Diabetes Maternal Aunt    Cancer Maternal Uncle        colon    Social History: Social History   Socioeconomic History   Marital status: Single    Spouse name: Not on file   Number of children: 0   Years of education: Not on file   Highest education level: Not on file  Occupational History   Occupation: Beautician  Tobacco Use   Smoking status: Never    Passive exposure: Never   Smokeless tobacco: Never  Vaping Use   Vaping status: Never Used  Substance and Sexual Activity   Alcohol use: No    Alcohol/week: 0.0 standard drinks of alcohol   Drug use: No   Sexual activity: Not on file  Other Topics Concern   Not on file  Social History Narrative   Not on file   Social Drivers of Health   Tobacco Use:  Low Risk (05/13/2024)   Patient History    Smoking Tobacco Use: Never    Smokeless Tobacco Use: Never    Passive Exposure: Never  Financial Resource Strain: Not on file  Food Insecurity: No Food Insecurity (05/13/2024)   Epic    Worried About Programme Researcher, Broadcasting/film/video in the Last Year: Never true    Ran Out of Food in the Last Year: Never true  Transportation Needs: No Transportation Needs (05/13/2024)   Epic    Lack of Transportation (Medical): No    Lack of Transportation (Non-Medical): No  Physical Activity: Not on file  Stress: Not on file  Social Connections: Patient Declined (05/13/2024)   Social Connection and Isolation Panel    Frequency of Communication with Friends and Family: Patient declined    Frequency of Social Gatherings with Friends and Family: Patient declined    Attends Religious Services: Patient declined    Database Administrator or Organizations: Patient declined    Attends Banker Meetings: Patient declined    Marital Status: Patient declined  Depression (PHQ2-9): Low Risk (04/19/2021)   Depression (PHQ2-9)    PHQ-2 Score: 0  Alcohol Screen: Not on file  Housing: Low Risk (05/13/2024)   Epic    Unable to Pay for Housing in the Last Year: No    Number of Times Moved in the Last Year: 0    Homeless in the Last Year: No  Utilities: Not At Risk (05/13/2024)   Epic    Threatened with loss of utilities: No  Health Literacy: Not on file   Allergies:  Allergies[1]  Objective:   Vital Signs:   Temp:  [97.9 F (36.6 C)-99.2 F (37.3 C)] 98 F (36.7 C) (12/29 0725) Pulse Rate:  [80-89] 83 (12/29 0725) Resp:  [18-20] 20 (12/29 0725) BP: (121-132)/(65-76) 131/66 (12/29 0725) SpO2:  [94 %-97 %] 95 % (12/29 0725) Weight:  [105.6 kg] 105.6 kg (12/29 0300) Last BM Date : 05/15/24  Weight change: Filed Weights   05/15/24 0300 05/16/24 0136 05/17/24 0300  Weight: 101.3 kg 106.7 kg 105.6 kg   Intake/Output:   Intake/Output Summary (Last 24 hours)  at 05/17/2024 0807 Last data filed at 05/17/2024 0500 Gross per 24 hour  Intake 1101.13 ml  Output 3525 ml  Net -2423.87 ml    Physical Exam  General:  tired appearing, sitting on EOB. + cough Cor: Regular rate & rhythm. No murmurs. JVD elevated.  Lungs: diminished bases Extremities: +3 BLE edema  Telemetry   NSR 70s 3-12 PVCs/hr (Personally reviewed)    EKG    ST 120s on admission  Labs   Basic Metabolic Panel: Recent Labs  Lab 05/13/24 0600 05/14/24 0738 05/15/24 0320 05/16/24 0259 05/17/24 0253  NA 142 139 140 138 140  K 3.8 3.5 3.7 3.2* 4.1  CL 107 105 104 104 105  CO2 23 23 28 25 25   GLUCOSE 110* 98 58* 133* 104*  BUN 27* 29* 29* 29* 36*  CREATININE 2.04* 1.81* 1.91* 2.09* 2.32*  CALCIUM  8.4* 8.2* 8.2* 8.0* 8.4*  MG 2.1 2.1  --  2.0  --     Liver Function Tests: Recent Labs  Lab 05/13/24 0600  AST 93*  ALT 23  ALKPHOS 104  BILITOT 0.4  PROT 6.4*  ALBUMIN 3.2*   No results for input(s): LIPASE, AMYLASE in the last 168 hours. No results for input(s): AMMONIA in the last 168 hours.  CBC: Recent Labs  Lab 05/13/24 0600 05/14/24 0328 05/15/24 0320 05/16/24 0259 05/17/24 0253  WBC 11.0* 8.5 8.5 6.1 7.1  NEUTROABS 9.0*  --   --   --   --   HGB 12.5* 11.4* 11.7* 11.3* 11.7*  HCT 40.0 35.5* 36.4* 35.5* 37.3*  MCV 84.0 80.9 81.6 82.0 82.3  PLT 206 191 199 180 203    Cardiac Enzymes: No results for input(s): CKTOTAL, CKMB, CKMBINDEX, TROPONINI in the last 168 hours.  BNP: BNP (last 3 results) Recent Labs    01/13/24 1208 01/30/24 1340  BNP 170.3* 119.0*    ProBNP (last 3 results) Recent Labs    05/13/24 0051  PROBNP 8,752.0*     CBG: Recent Labs  Lab 05/16/24 1158 05/16/24 1617 05/16/24 2053 05/16/24 2118 05/17/24 0525  GLUCAP 126* 157* 105* 106* 93    Coagulation Studies: No results for input(s): LABPROT, INR in the last 72 hours.   Imaging   US  EKG SITE RITE Result Date: 05/17/2024 If Site  Rite image not attached, placement could not be confirmed due to current cardiac rhythm.   Medications:   Current Medications:  allopurinol   100 mg Oral Daily   amiodarone   400 mg Oral BID   aspirin  EC  81 mg Oral Daily   empagliflozin   10 mg Oral Daily   furosemide   80 mg Intravenous BID   insulin  aspart  0-9 Units Subcutaneous TID WC   spironolactone   25 mg Oral Daily   tamsulosin   0.4 mg Oral Daily    Infusions:  heparin  1,200 Units/hr (05/17/24 0157)   iron  sucrose Stopped (05/16/24 1311)   Patient Profile   Thomas Gardner is a 68 y.o. male with iCM, chronic HFrEF, unvascularized 2v CAD (pRCA CTA and 70% pLAD/mLAD, 80% dLAD), HTN, HLD, DMII, CKD stage 3, morbid obesity, PVCs, asthma, prostate cancer s/p radiation, OSA, CVA (10/22') and carotid stenosis. AHF team consulted with A/C HFrEF and concerns of low output HF.   Assessment/Plan  A/C HFrEF with suspected low  output HF - Echo 05/13/24 EF <20%, LV with GHK, mild concentric LVH, RV normal, LA mod dilated, mild-mod MR, trivial TR - LHC 10/25: pRCA CTA and 70% pLAD/mLAD, 80% dLAD. Medical management - iCM. Repots long history of PVCs since he was young (PVC mediated?). Burden on zio in 24' 9.6% - NYHA IV on admission - Volume overloaded on exam.  Increase lasix  80>120 IV BID. Will also give 2.5 metolazone  this morning. Follow response, may need more this afternoon.  - Toprol  stopped today with suspected low output HF and volume overload - Plan for PICC placement today. Follow co-ox and CVP. Check lactic acid.  - Start milrinone  0.125. Will need inotrope support for diuresis.  - Continue BiDil  1 tab TID - GDMT limited with CKD - Continue Jardiance  10 mg daily, A1c 6.8 12/26 - Strict I&O, daily weights  - s/p IV iron , no cMRI - Consider RHC once diuresed.  - HIV (-). TSH WNL - Place UNNA boots.   CAD - Hx unvascularized 2v CAD (LHC 10/25: pRCA CTA and 70% pLAD/mLAD, 80% dLAD) - Primary team discussed revascularization  of LAD but opted for medical management/ GDMT optimization. Would be high risk PCI and require possible lithotripsy.  - Denies CP - Continue ASA - Continue repatha  (at discharge). Continue statin (did not tolerate crestor )  AKI on CKD stage 3 - Baseline SCr ~1.5 - up to 2.32 today - Follow with diuresis, avoid hypotension - Adding inotrope support as above  DMII - A1c 6.8 - Continue SGLT2i. Previously on mounjaro .   PVCs - PO amiodarone  stopped today - Follow PVC burden,  will restart IV with inotrope initiation - Needs CPAP, difficulty tolerating at home.   Pleural effusion - Noted on echo 05/13/24 - Large in L lateral region - Follow with diuresis - CXR 12/25 with layering bilateral pleural effusions with bibasilar atelectasis - Would repeat CXR once diuresed. May need tap  OSA - Needs to repeat Itamar, did not complete the one given OP - Will need to see pulm or Dr Shlomo at discharge to discuss further options as he did not tolerate CPAP.     Length of Stay: 4  Beckey LITTIE Coe, NP  05/17/2024, 8:07 AM  Advanced Heart Failure Team Pager [phone] (M-F; 7a - 5p)   Please visit [email or web address]: For overnight coverage please call cardiology fellow first. If fellow not available call Shock/ECMO MD on call.  For ECMO / Mechanical Support (Impella, IABP, LVAD) issues call Shock / ECMO MD on call.    Patient seen with NP, I formulated the plan and agree with the above note.   Patient with history as noted above was admitted with gradually worsening dyspnea and peripheral edema over about a month, considerably worse 3-4 days prior to admission.  No chest pain. HS-TnI 145 => 482. Creatinine today up to 2.32, rising with attempts at diuresis.  Concern for low output, PICC placed.  Lactate actually in normal range at 0.6.   Patient reports improving dyspnea but still with some shortness of breath and cough.   General: NAD Neck: JVP difficult, no thyromegaly or thyroid nodule.   Lungs: Decreased BS at bases.  CV: Nondisplaced PMI.  Heart regular S1/S2, no S3/S4, no murmur.  2+ edema to thighs.  No carotid bruit.  Normal pedal pulses.  Abdomen: Soft, mild diffuse tenderness, no hepatosplenomegaly, no distention.  Skin: Intact without lesions or rashes.  Neurologic: Alert and oriented x 3.  Psych: Normal affect. Extremities: No clubbing  or cyanosis.  HEENT: Normal.   1. 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 History of frequent PVCs (9.6% on Zio in 8/24) as well as HTN.  Echo back in 7/22 with EF 35-40%.  Echoes in 10/22 and 8/24 with EF 50%, echo in 8/25 with EF 35-40% led to cath showing occluded RCA with collaterals and long 70% proximal LAD stenosis (hemodynamically significant by FFR) as well as 80% distal LAD stenosis.  With this admission, echo showed EF <20% with moderate LV dilation, RV normal, IVC dilated.  EF is decreased out of proportion to cath.  Based on Revived-BCIS data, decision made not to intervene on the moderate (but hemodynamically significant) LAD stenosis given lack of ACS/chest pain.  He is volume overloaded on exam, creatinine rising with diuresis and concern for low output.  JVP is hard to assess but he has significant peripheral edema.  - PICC placed, will follow CVP and co-ox.  - Agree with milrinone  0.25 given rising creatinine and ongoing volume overload.  - Place Unna boots.  - Lasix  120 mg IV bid today with metolazone  2.5 x 1.  - Continue Bidil  1 tab tid - Continue Jardiance  10 daily.  - GDMT will be limited by AKI on CKD stage 3.  - He will need formal RHC eventually.  2. CAD: Cath 10/25 showed occluded RCA with collaterals and long 70% proximal LAD stenosis (hemodynamically significant by FFR) as well as 80% distal LAD stenosis.  Patient has not had chest pain, presentation not consistent with ACS.  Seen by Dr. Wendel initially, and based on Revived-BCIS data, decision made  not to intervene on the moderate (but hemodynamically significant) LAD stenosis given lack of ACS/chest pain. I think this is reasonable as I am not sure he would derive much benefit in absence of ACS.  - Continue ASA 81 - Continue atorvastatin  (on Repatha  at home).  3. AKI on CKD stage 3: Baseline creatinine around 1.6, up to 2.3 with attempts at diuresis.  Worry that this is cardiorenal syndrome.  - PICC placed, diurese based on CVP.  - Adding milrinone  as above.  4. H/o CVA in 10/22 with mod-severe right PCA stenosis on CTA head/neck.  - Continue ASA 81 - Continue atorvastatin  80, on Repatha  at home.  5. PVCs: H/o frequent PVCs, ?contribute to cardiomyopathy.   - On amiodarone  po currently, will make IV while on milrinone .  6. OSA: Did not tolerate CPAP.  7. Pleural effusions: Noted on CXR.  May need thoracentesis, follow with diuresis.   Ezra Shuck 05/17/2024 12:30 PM      [1]  Allergies Allergen Reactions   Erythromycin Nausea And Vomiting   Nsaids Other (See Comments)    Contraindication due to CKD    Onion Nausea And Vomiting   "

## 2024-05-17 NOTE — Progress Notes (Addendum)
 " Cardiology Consultation:   Patient ID: JAMA KRICHBAUM MRN: [id number]; DOB: Oct 08, 1955  Admit date: 05/13/2024 Date of Consult: 05/17/2024  Primary Care Provider: Maree Leni Edyth DELENA, MD [facility] Cardiologist: Annabella Scarce, MD  [facility] Electrophysiologist:  None   Patient Profile:   Thomas Gardner is a 68 y.o. 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   Interval Events:   Remains dyspneic and orthopneic.  Denies chest pain.  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   nueruologist---- dr rosemarie;    04-10-2022  per pt residul at times balance impaired   Arthritis    back shoulders and legs   Asthma    Bilateral lower extremity edema    @ times, goes away with legs elevated   CHF (congestive heart failure) (HCC)    Complication of anesthesia    woke up with endoscopy yrs ago   Diabetes mellitus type 2, insulin  dependent (HCC) 2003   Does mobilize using cane    Familial hyperlipidemia 05/07/2021   GERD (gastroesophageal reflux disease)    History of hiatal hernia    Hyperlipidemia    Hypertension    OSA (obstructive sleep apnea)    did not tolerate cpap, uses pillow for elevation   Prostate cancer [facility])    Past Surgical History:  Procedure Laterality Date   BREATH TEK H PYLORI  11/11/2011   Procedure: BREATH TEK H PYLORI;  Surgeon: Alm VEAR Angle, MD;  Location: [facility];  Service: General;  Laterality: N/A;   CARDIAC CATHETERIZATION  2011   per pt done by Dr Ladona at [facility],  told no bloackages   colonscopy  2018    CORONARY PRESSURE/FFR WITH 3D MAPPING N/A 03/11/2024   Procedure: Coronary Pressure/FFR w/3D Mapping;  Surgeon: Darron Deatrice DELENA, MD;  Location: [facility];  Service: Cardiovascular;  Laterality: N/A;   ESOPHAGOGASTRODUODENOSCOPY (EGD) WITH PROPOFOL  N/A 05/04/2014   Procedure: ESOPHAGOGASTRODUODENOSCOPY (EGD) WITH PROPOFOL ;  Surgeon: Lamar Donnald GAILS, MD;  Location: [facility];  Service: Endoscopy;  Laterality: N/A;   GOLD SEED IMPLANT N/A 04/16/2022   Procedure: GOLD SEED IMPLANT;  Surgeon: Elisabeth Valli BIRCH, MD;  Location: [facility];  Service: Urology;  Laterality: N/A;   LEFT HEART CATH AND CORONARY ANGIOGRAPHY N/A 03/11/2024   Procedure: LEFT HEART CATH AND CORONARY ANGIOGRAPHY;  Surgeon: Darron Deatrice DELENA, MD;  Location: [facility];  Service: Cardiovascular;  Laterality: N/A;   Inpatient Medications: Scheduled Meds:  allopurinol   100 mg Oral Daily   amiodarone   400 mg Oral BID   aspirin  EC  81 mg Oral Daily   empagliflozin   10 mg Oral Daily   furosemide   80 mg Intravenous BID   insulin  aspart  0-9 Units Subcutaneous TID WC   metoprolol  succinate  50 mg Oral Daily   spironolactone   50 mg Oral Daily   tamsulosin   0.4 mg Oral Daily   Continuous Infusions:  heparin  1,200 Units/hr (05/17/24 0157)   iron  sucrose Stopped (05/16/24 1311)   PRN Meds: acetaminophen  **OR** acetaminophen , albuterol , guaiFENesin -dextromethorphan , lip balm, loratadine   Allergies:  Allergies[1]  Social History:   Social History   Socioeconomic History   Marital status: Single    Spouse name: Not on file   Number of children: 0   Years of education: Not on file   Highest education level: Not on file  Occupational History   Occupation: Beautician  Tobacco Use   Smoking status: Never    Passive exposure: Never   Smokeless tobacco: Never  Vaping Use   Vaping status: Never Used  Substance and Sexual Activity   Alcohol use: No    Alcohol/week: 0.0 standard drinks  of alcohol   Drug use: No   Sexual activity: Not on file  Other Topics Concern   Not on file  Social History Narrative   Not on file   Social Drivers of Health   Tobacco Use: Low Risk (05/13/2024)   Patient History    Smoking Tobacco Use: Never    Smokeless Tobacco Use: Never    Passive Exposure: Never  Financial Resource Strain: Not on file  Food Insecurity: No Food Insecurity (05/13/2024)   Epic    Worried About Programme Researcher, Broadcasting/film/video in the Last Year: Never true    Ran Out of Food in the Last Year: Never true  Transportation Needs: No Transportation Needs (05/13/2024)   Epic    Lack of Transportation (Medical): No    Lack of Transportation (Non-Medical): No  Physical Activity: Not on file  Stress: Not on file  Social Connections: Patient Declined (05/13/2024)   Social Connection and Isolation Panel    Frequency of Communication with Friends and Family: Patient declined    Frequency of Social Gatherings with Friends and Family: Patient declined    Attends Religious Services: Patient declined    Active Member of Clubs or Organizations: Patient declined    Attends Banker Meetings: Patient declined    Marital Status: Patient declined  Intimate Partner Violence: Not At Risk (05/13/2024)   Epic    Fear of Current or Ex-Partner: No    Emotionally Abused: No    Physically Abused: No    Sexually Abused: No  Depression (PHQ2-9): Low Risk (04/19/2021)   Depression (PHQ2-9)    PHQ-2 Score: 0  Alcohol Screen: Not on file  Housing: Low Risk (05/13/2024)   Epic    Unable to Pay for Housing in the Last Year: No    Number of Times Moved in the Last Year: 0    Homeless in the Last Year: No  Utilities: Not At Risk (05/13/2024)   Epic    Threatened with loss of utilities: No  Health Literacy: Not on file    Family History:    Family History  Problem Relation Age of Onset   Heart failure Mother    Cancer Mother        ovarian   Diabetes Mother    Stroke Father     Heart failure Sister    Heart failure Brother    Colon cancer Brother    Cancer Maternal Aunt        breast   Diabetes Maternal Aunt    Cancer Maternal Uncle        colon    ROS:  Review of Systems: [y] = yes, [ ]  = no      General: Weight gain [ ] ; Weight loss [ ] ; Anorexia [ ] ; Fatigue [ ] ; Fever [ ] ; Chills [ ] ; Weakness [ ]    Cardiac: Chest pain/pressure [ ] ; Resting SOB [y];  Exertional SOB [ ] ; Orthopnea [ ] ; Pedal Edema [ ] ; Palpitations [ ] ; Syncope [ ] ; Presyncope [ ] ; Paroxysmal nocturnal dyspnea [ ]    Pulmonary: Cough [ ] ; Wheezing [ ] ; Hemoptysis [ ] ; Sputum [ ] ; Snoring [ ]    GI: Vomiting [ ] ; Dysphagia [ ] ; Melena [ ] ; Hematochezia [ ] ; Heartburn [ ] ; Abdominal pain [ ] ; Constipation [ ] ; Diarrhea [ ] ; BRBPR [ ]    GU: Hematuria [ ] ; Dysuria [ ] ; Nocturia [ ]  Vascular: Pain in legs with walking [ ] ; Pain in feet with lying flat [ ] ; Non-healing sores [ ] ; Stroke [ ] ; TIA [ ] ; Slurred speech [ ] ;   Neuro: Headaches [ ] ; Vertigo [ ] ; Seizures [ ] ; Paresthesias [ ] ;Blurred vision [ ] ; Diplopia [ ] ; Vision changes [ ]    Ortho/Skin: Arthritis [ ] ; Joint pain [ ] ; Muscle pain [ ] ; Joint swelling [ ] ; Back Pain [ ] ; Rash [ ]    Psych: Depression [ ] ; Anxiety [ ]    Heme: Bleeding problems [ ] ; Clotting disorders [ ] ; Anemia [ ]    Endocrine: Diabetes [ ] ; Thyroid dysfunction [ ]    Physical Exam/Data:   Vitals:   05/16/24 1900 05/16/24 2300 05/17/24 0300 05/17/24 0725  BP: 132/76 129/73 121/69 131/66  Pulse: 88 82 80 83  Resp: 18 18 18 20   Temp: 97.9 F (36.6 C) 98.1 F (36.7 C) 98 F (36.7 C) 98 F (36.7 C)  TempSrc: Oral Oral Oral Oral  SpO2: 97% 97% 94% 95%  Weight:   105.6 kg   Height:   5' 6 (1.676 m)     Intake/Output Summary (Last 24 hours) at 05/17/2024 0804 Last data filed at 05/17/2024 0500 Gross per 24 hour  Intake 1101.13 ml  Output 3525 ml  Net -2423.87 ml      05/17/2024    3:00 AM 05/16/2024    1:36 AM 05/15/2024    3:00 AM  Last 3 Weights   Weight (lbs) 232 lb 12.9 oz 235 lb 3.2 oz 223 lb 5.2 oz  Weight (kg) 105.6 kg 106.686 kg 101.3 kg     Body mass index is 37.58 kg/m.  General:  Well nourished, well developed, in no acute distress HEENT: JVP to jaw Cardiac:  normal S1, S2; RRR; no murmur  Lungs:  clear to auscultation bilaterally, no wheezing, rhonchi or rales  Abd: soft, nontender, no hepatomegaly  Ext: 2+ LE edema b/l, moderate abdominal distension  Musculoskeletal:  No deformities, BUE and BLE strength normal and equal Skin: warm and dry  Neuro:  CNs 2-12 intact, no focal abnormalities noted Psych:  Normal affect   EKG:  The EKG was personally reviewed and demonstrates: None today  Telemetry:  Telemetry was personally reviewed and demonstrates:SR with PVCs  Relevant CV Studies:  TTE  Result date: 05/13/24 1. Left ventricular ejection fraction, by estimation, is <20%. The left  ventricle has severely decreased function. The left ventricle demonstrates  global hypokinesis. The left ventricular internal cavity size was  moderately dilated. There is mild  concentric left ventricular hypertrophy. Left ventricular diastolic  parameters are indeterminate.   2. Right ventricular systolic function is normal. The right ventricular  size is normal. Tricuspid regurgitation signal is inadequate for assessing  PA pressure.   3. Left atrial size was moderately dilated.   4. Large pleural effusion in the left lateral region.   5. The mitral valve is normal in structure. Mild to moderate mitral valve  regurgitation. No evidence of mitral stenosis.   6. The aortic valve is tricuspid. There is mild calcification of the  aortic valve. Aortic valve regurgitation is trivial. Aortic valve  sclerosis/calcification is present, without any evidence of aortic  stenosis.   7. The inferior vena cava is dilated in size with <50% respiratory  variability, suggesting right atrial pressure of 15 mmHg.   Laboratory  Data:  Chemistry Recent Labs  Lab 05/15/24 0320 05/16/24 0259 05/17/24 0253  NA 140 138 140  K 3.7 3.2* 4.1  CL 104 104 105  CO2 28 25 25   GLUCOSE 58* 133* 104*  BUN 29* 29* 36*  CREATININE 1.91* 2.09* 2.32*  CALCIUM  8.2* 8.0* 8.4*  GFRNONAA 38* 34* 30*  ANIONGAP 8 9 10     Recent Labs  Lab 05/13/24 0600  PROT 6.4*  ALBUMIN 3.2*  AST 93*  ALT 23  ALKPHOS 104  BILITOT 0.4   Hematology Recent Labs  Lab 05/15/24 0320 05/16/24 0259 05/17/24 0253  WBC 8.5 6.1 7.1  RBC 4.46 4.33 4.53  HGB 11.7* 11.3* 11.7*  HCT 36.4* 35.5* 37.3*  MCV 81.6 82.0 82.3  MCH 26.2 26.1 25.8*  MCHC 32.1 31.8 31.4  RDW 15.1 15.0 15.0  PLT 199 180 203   BNP Recent Labs  Lab 05/13/24 0051  PROBNP 8,752.0*    Radiology/Studies:  US  EKG SITE RITE Result Date: 05/17/2024 If Site Rite image not attached, placement could not be confirmed due to current cardiac rhythm.  ECHOCARDIOGRAM COMPLETE Result Date: 05/13/2024    ECHOCARDIOGRAM REPORT   Patient Name:   Thomas Gardner Date of Exam: 05/13/2024 Medical Rec #:  [id number]       Height:       66.0 in Accession #:    [id number]      Weight:       238.5 lb Date of Birth:  1956/01/23       BSA:          2.155 m Patient Age:    68 years        BP:           134/76 mmHg Patient Gender: M               HR:           103 bpm. Exam Location:  Inpatient Procedure: 2D Echo, Cardiac Doppler, Color Doppler and Intracardiac            Opacification Agent (Both Spectral and Color Flow Doppler were            utilized during procedure). Indications:    Dyspnea R06  History:        Patient has prior history of Echocardiogram examinations, most                 recent 02/11/2024. Signs/Symptoms:Dyspnea.  Sonographer:    Merlynn Argyle Referring Phys: [id number] GILLIAN HERO [facility]  Sonographer Comments: Image acquisition challenging due to respiratory motion and Image acquisition challenging due to patient body habitus. IMPRESSIONS  1. Left ventricular ejection fraction, by  estimation, is <20%. The left ventricle has severely decreased function. The left ventricle demonstrates global hypokinesis. The left ventricular internal cavity size was moderately dilated. There is mild concentric left ventricular hypertrophy. Left ventricular diastolic parameters are indeterminate.  2. Right ventricular systolic function is normal. The right ventricular size is normal. Tricuspid regurgitation signal is inadequate for assessing PA pressure.  3. Left atrial size was moderately dilated.  4. Large pleural effusion in the left lateral region.  5. The mitral valve is normal in structure. Mild to moderate mitral valve regurgitation. No evidence of mitral stenosis.  6. The aortic valve is tricuspid. There is mild calcification of the aortic valve. Aortic valve regurgitation is trivial. Aortic valve sclerosis/calcification is present, without any evidence of aortic stenosis.  7. The inferior vena cava is dilated in size with <50% respiratory variability, suggesting right atrial pressure of 15 mmHg. FINDINGS  Left Ventricle: Left ventricular ejection fraction, by estimation, is <20%. The left ventricle has severely decreased function. The left ventricle demonstrates global hypokinesis. Definity  contrast agent was given IV to delineate the left ventricular endocardial borders. The left ventricular internal cavity size was moderately dilated. There is mild concentric left ventricular hypertrophy. Left ventricular diastolic parameters are indeterminate. Right Ventricle: The right ventricular size is normal. No increase in right ventricular wall thickness. Right ventricular systolic function is normal. Tricuspid regurgitation signal is inadequate for assessing PA pressure. Left Atrium: Left atrial size was moderately dilated. Right Atrium: Right atrial size was normal in size. Pericardium: There is no evidence of pericardial effusion. Mitral Valve: The mitral valve is normal in structure. Mild to moderate  mitral valve regurgitation. No evidence of mitral valve stenosis. Tricuspid Valve: The tricuspid valve is normal in structure. Tricuspid valve regurgitation is trivial. No evidence of tricuspid stenosis. Aortic Valve: The aortic valve is tricuspid. There is mild calcification of the aortic valve. Aortic valve regurgitation is trivial. Aortic valve sclerosis/calcification is present, without any evidence of aortic stenosis. Pulmonic Valve: The pulmonic valve was normal in structure. Pulmonic valve regurgitation is not visualized. No evidence of pulmonic stenosis. Aorta: The aortic root is normal in size and structure. Venous: The inferior vena cava is dilated in size with less than 50% respiratory variability, suggesting right atrial pressure of 15 mmHg. IAS/Shunts: No atrial level shunt detected by color flow Doppler. Additional Comments: There is a large pleural effusion in the left lateral region.  LEFT VENTRICLE PLAX 2D LVIDd:         6.00 cm      Diastology LVIDs:         5.10 cm      LV e' medial:    7.29 cm/s LV PW:         1.10 cm      LV E/e' medial:  13.4 LV IVS:        1.00 cm      LV e' lateral:   10.30 cm/s LVOT diam:     2.10 cm      LV E/e' lateral: 9.5 LV SV:         46 LV SV Index:   22 LVOT Area:     3.46 cm LV IVRT:       77 msec  LV Volumes (MOD) LV vol d, MOD A2C: 132.5 ml LV vol d, MOD A4C: 188.0 ml LV vol s, MOD A2C: 97.3 ml LV vol s, MOD A4C: 132.0 ml LV SV MOD A2C:     35.2 ml LV SV MOD A4C:     188.0 ml LV SV MOD BP:      41.2 ml RIGHT VENTRICLE            IVC RV Basal diam:  3.10 cm    IVC diam: 2.20 cm RV S prime:     6.64 cm/s TAPSE (M-mode): 1.7 cm LEFT ATRIUM  Index        RIGHT ATRIUM           Index LA diam:        4.80 cm  2.23 cm/m   RA Area:     13.60 cm LA Vol (A2C):   63.0 ml  29.23 ml/m  RA Volume:   36.60 ml  16.98 ml/m LA Vol (A4C):   103.0 ml 47.79 ml/m LA Biplane Vol: 81.1 ml  37.63 ml/m  AORTIC VALVE LVOT Vmax:   76.60 cm/s LVOT Vmean:  61.900 cm/s LVOT  VTI:    0.134 m  AORTA Ao Root diam: 3.50 cm Ao Asc diam:  3.30 cm MITRAL VALVE MV Area (PHT): 6.48 cm    SHUNTS MV Decel Time: 117 msec    Systemic VTI:  0.13 m MV E velocity: 97.50 cm/s  Systemic Diam: 2.10 cm MV A velocity: 61.80 cm/s MV E/A ratio:  1.58 Toribio Fuel MD Electronically signed by Toribio Fuel MD Signature Date/Time: 05/13/2024/5:06:04 PM    Final    Assessment and Plan:  Thomas Gardner is a 68 y.o. 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    Acute on chronic heart failure:  I reviewed the patient's coronary angiography study from October 2025.  Based on the revive trial I do not think PCI of his moderate though hemodynamically significant LAD will improve his ejection fraction.  He is by his own admission 20 pounds over his dry weight.  I think his reduction in EF is likely volume driven.  He is cool to touch on my exam today.  I suspect he is in a low output state I will place a PICC line and check a co-oximetry level.  I will have heart failure see the patient as well.   -Will discontinue Toprol .   -Discontinue spironolactone  given elevated creatinine.   -Continue Jardiance  10mg .   -Will discontinue amiodarone .   -Will likely start milrinone  later today depending on co oximetry level.   -Consider Entresto  later.   -Start BiDil  TID. -Likely need RHC this admission -Continue lasix  80mg  IV BID  Coronary artery disease:  Patient has CTO of RCA and 70% proximal LAD and mid LAD lesion on coronary angiography October 2025. Had a long talk with the patient about potential PCI of his LAD.  He has had no anginal  symptoms.  I think PCI of the LAD will be of little benefit in this scenario and would not likely result in an improved ejection fraction.  I think medical therapy should be pursued.   -Will discontinue heparin .   -Continue aspirin  81 mg.   -Will start atorvastatin  80 mg.   -Hold beta-blocker for now.  Patient on Repatha  though LDL above goal.  Discussed with pharmacy.  Acute kidney injury:   History of stage III kidney disease.  Baseline creatinine seems to be about 1.6-2.  Creatinine currently is elevated.  I am concerned about low output heart failure.  See discussion above.   -Will hold spironolactone  for now.   -Continue Jardiance  10 mg.  -Consider Entresto  later  T2DM:  -Continue aspirin  81 mg -Start atorvastatin  80 mg -Continue Jardiance   10 mg -Likely Entresto  introduction later  Hyperlipidemia  -Will plan on continuing home Repatha  -Start atorvastatin  80 mg  PVCs  -Monitor for now  -D/C amiodarone   -Hold BB for now (see discussion above)  For questions or updates, please contact [facility] Please consult [email or web address] for contact info under   Signed, Carlus Stay K Viveka Wilmeth, MD  05/17/2024 8:04 AM Patient ID: Thomas Gardner, male   DOB: 1955-08-25, 68 y.o.   MRN: [id number]     [1]  Allergies Allergen Reactions   Erythromycin Nausea And Vomiting   Nsaids Other (See Comments)    Contraindication due to CKD    Onion Nausea And Vomiting   "

## 2024-05-17 NOTE — Progress Notes (Signed)
 PT Cancellation Note  Patient Details Name: Thomas Gardner MRN: [id number] DOB: November 29, 1955   Cancelled Treatment:    Reason Eval/Treat Not Completed: Patient at procedure or test/unavailable. Pt unavailable with in room procedure in progress. Seen by Mobility Specialist in AM. PT will reattempt as time allows.  Isaiah DEL. Alexyss Balzarini, PT, DPT   [employer] 05/17/2024, 11:20 AM

## 2024-05-17 NOTE — Hospital Course (Signed)
 As per H&P: 68 y.o. male with history of chronic HFrEF last EF measured on September 2025 was 35 to 40%, has had cardiac cath in October 2025 which showedDist LAD lesion is 80% stenosed,Prox RCA lesion is 100% stenosed, Prox LAD to Mid LAD lesion is 70% stenose, diabetes mellitus type 2, chronic disease stage III, gout, sleep apnea, history of prostate cancer, history of stroke presents to the ER because of increasing shortness of breath with exertion over the last 2 to 3 days.  Patient states he also has been having some orthopnea and has noticed some weight gain and lower extremity edema more than usual.  Over the last one week patient has been having productive cough denies any fever or chills.  He has been taking Lasix  despite which he was getting more short of breath and decided to come to the ER.  He also has been having chest tightness on exertion last few days.

## 2024-05-17 NOTE — H&P (View-Only) (Signed)
 "   Advanced Heart Failure Team Consult Note   Primary Physician: Maree Leni Edyth DELENA, MD Cardiologist:  Annabella Scarce, MD HPI:    Thomas Gardner is seen today for evaluation of A/C HFrEF with suspected low output HF at the request of Dr. Wendel.   Thomas Gardner is a 68 y.o. male with iCM, chronic HFrEF, unvascularized 2v CAD (pRCA CTA and 70% pLAD/mLAD, 80% dLAD), HTN, HLD, DMII, CKD stage 3, morbid obesity, PVCs, asthma, prostate cancer s/p radiation, OSA, CVA (10/22') and carotid stenosis.   - Myoview 13' LVEF 37%, no ischemia.  - Echo 7/22 LVEF 55-40%, GIDD  - Zio 8/24 with 9.6% PVC burden - Echo 9/25 EF 35-40%, LV GHK, mild LVH, RV normal, mild LAE, trivial MR.   8/25 seen in cards clinic. EF down to 35-40%. Volume overloaded on exam despite lasix  10 mg BID.   - CCTA 10/25 with calcium  score of 2810 with >70% stenosis in pRCA and mod 50-60% stenosis in prox-midLAD and LCx   - LHC 10/25: pRCA CTA and 70% pLAD/mLAD, 80% dLAD. Plan for medical management and GDMT optimization. Would be high risk PCI with possible lithotripsy.   Presented to the ED earlier this week with SOB, BLE edema, weight gain, abd fullness and PND. Noticed swelling in his legs around 1 month ago, had been on low dose PO lasix . Denies CP on admission just chest pressure/fullness. Reports compliance with all meds, follows regularly with cardiology and PCP. Had noticed his weight go up ~30 lbs in the last month despite gentle diuresis outpatient. Reports family history of CHF in his mom. Denies ETOH, tobacco use or illicit drug use. Works as a physicist, medical.   Admission labs reviewed: K 3.7, SCr 2.06, ProBNP 8.7K, HsTrop 145>482, EKG on admission ST 120s. Echo 05/13/24 EF <20%, LV with GHK, mild concentric LVH, RV normal, LA mod dilated, mild-mod MR, trivial TR. Diuresis has been started with IV lasix . AHF team consulted today with concern for low output HF. SCr tending up, cool lower extremities.   Feels  better but still unable to lay flat with constant rattling. Gets SOB with ambulation to the bathroom.   Home Medications Prior to Admission medications  Medication Sig Start Date End Date Taking? Authorizing Provider  acetaminophen  (TYLENOL ) 325 MG tablet Take 1-2 tablets (325-650 mg total) by mouth every 4 (four) hours as needed for mild pain. 03/14/21  Yes Angiulli, Toribio PARAS, PA-C  allopurinol  (ZYLOPRIM ) 100 MG tablet Take 1 tablet (100 mg total) by mouth daily. Patient taking differently: Take 50 mg by mouth 2 (two) times daily. 05/05/24  Yes   ascorbic acid  (VITAMIN C ) 500 MG tablet Take 1 tablet (500 mg total) by mouth daily. 04/27/24  Yes   aspirin  EC 81 MG tablet Take 1 tablet (81 mg total) by mouth daily. 02/16/24  Yes   empagliflozin  (JARDIANCE ) 25 MG TABS tablet Take 1 tablet (25 mg total) by mouth in the morning. Patient taking differently: Take 12.5 mg by mouth in the morning and at bedtime. 10/16/23  Yes   Evolocumab  (REPATHA  SURECLICK) 140 MG/ML SOAJ Inject 140 mg into the skin every 14 (fourteen) days. Please keep your upcoming appointment for refills. 03/23/24  Yes Scarce Annabella, MD  furosemide  (LASIX ) 20 MG tablet Take 1 tablet (20 mg total) by mouth daily. May take an additional tablet as needed for weight gain of 2 lbs overnight or 5 lbs in one week. 04/13/24  Yes Vannie Reche RAMAN, NP  insulin  glargine (LANTUS  SOLOSTAR) 100 UNIT/ML Solostar Pen Inject 16 Units into the skin every morning AND 20 Units at bedtime. 04/30/24  Yes   metoprolol  succinate (TOPROL -XL) 50 MG 24 hr tablet Take 1 tablet (50 mg total) by mouth at bedtime. Take with or immediately following a meal. 02/27/24  Yes Vannie Reche RAMAN, NP  sacubitril -valsartan  (ENTRESTO ) 49-51 MG Take 1 tablet by mouth 2 (two) times daily. 03/24/24  Yes Vannie Reche RAMAN, NP  spironolactone  (ALDACTONE ) 25 MG tablet Take 1 tablet (25 mg total) by mouth daily. Patient taking differently: Take 12.5 mg by mouth 2 (two) times daily.  03/22/24  Yes   tirzepatide  (MOUNJARO ) 2.5 MG/0.5ML Pen Inject 2.5 mg into the skin once a week. 12/18/23  Yes   vitamin D3 (CHOLECALCIFEROL ) 25 MCG tablet Take 1 tablet (1,000 Units total) by mouth daily. 04/27/24  Yes   Accu-Chek Softclix Lancets lancets Use to test Blood Sugars daily. 02/16/24     albuterol  (VENTOLIN  HFA) 108 (90 Base) MCG/ACT inhaler Inhale 1-2 puffs into the lungs every 6 (six) hours as needed. 05/14/24     Continuous Glucose Sensor (FREESTYLE LIBRE 3 SENSOR) MISC Use as directed for blood sugar monitoring at least three times daily and as needed Patient not taking: Reported on 05/13/2024 02/16/24     glucose blood (ACCU-CHEK GUIDE TEST) test strip Use to test Blood Sugars once daily 02/16/24     tamsulosin  (FLOMAX ) 0.4 MG CAPS capsule Take 1 capsule (0.4 mg total) by mouth daily. 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   nueruologist---- dr rosemarie;    04-10-2022  per pt residul at times balance impaired   Arthritis    back shoulders and legs   Asthma    Bilateral lower extremity edema    @ times, goes away with legs elevated   CHF (congestive heart failure) (HCC)    Complication of anesthesia    woke up with endoscopy yrs ago   Diabetes mellitus type 2, insulin  dependent (HCC) 2003   Does mobilize using cane    Familial hyperlipidemia 05/07/2021   GERD (gastroesophageal reflux disease)    History of hiatal hernia    Hyperlipidemia    Hypertension    OSA (obstructive sleep apnea)    did not tolerate cpap, uses pillow for elevation   Prostate cancer [facility])    Past Surgical History: Past Surgical History:  Procedure Laterality Date   BREATH TEK H PYLORI  11/11/2011   Procedure: BREATH TEK H PYLORI;  Surgeon: Alm VEAR Angle, MD;  Location: [facility];  Service: General;  Laterality: N/A;   CARDIAC CATHETERIZATION  2011   per pt done by  Dr Ladona at [facility],  told no bloackages   colonscopy  2018   CORONARY PRESSURE/FFR WITH 3D MAPPING N/A 03/11/2024   Procedure: Coronary Pressure/FFR w/3D Mapping;  Surgeon: Darron Deatrice LABOR, MD;  Location: [facility];  Service: Cardiovascular;  Laterality: N/A;   ESOPHAGOGASTRODUODENOSCOPY (EGD) WITH PROPOFOL  N/A 05/04/2014   Procedure: ESOPHAGOGASTRODUODENOSCOPY (EGD) WITH PROPOFOL ;  Surgeon: Lamar Donnald GAILS, MD;  Location[facility];  Service: Endoscopy;  Laterality: N/A;   GOLD SEED IMPLANT N/A 04/16/2022   Procedure: GOLD SEED IMPLANT;  Surgeon: Elisabeth Valli BIRCH, MD;  Location: [facility];  Service: Urology;  Laterality: N/A;   LEFT HEART CATH AND CORONARY  ANGIOGRAPHY N/A 03/11/2024   Procedure: LEFT HEART CATH AND CORONARY ANGIOGRAPHY;  Surgeon: Darron Deatrice LABOR, MD;  Location: [facility];  Service: Cardiovascular;  Laterality: N/A;   Family History: Family History  Problem Relation Age of Onset   Heart failure Mother    Cancer Mother        ovarian   Diabetes Mother    Stroke Father    Heart failure Sister    Heart failure Brother    Colon cancer Brother    Cancer Maternal Aunt        breast   Diabetes Maternal Aunt    Cancer Maternal Uncle        colon    Social History: Social History   Socioeconomic History   Marital status: Single    Spouse name: Not on file   Number of children: 0   Years of education: Not on file   Highest education level: Not on file  Occupational History   Occupation: Beautician  Tobacco Use   Smoking status: Never    Passive exposure: Never   Smokeless tobacco: Never  Vaping Use   Vaping status: Never Used  Substance and Sexual Activity   Alcohol use: No    Alcohol/week: 0.0 standard drinks of alcohol   Drug use: No   Sexual activity: Not on file  Other Topics Concern   Not on file  Social History Narrative   Not on file   Social Drivers of Health   Tobacco Use:  Low Risk (05/13/2024)   Patient History    Smoking Tobacco Use: Never    Smokeless Tobacco Use: Never    Passive Exposure: Never  Financial Resource Strain: Not on file  Food Insecurity: No Food Insecurity (05/13/2024)   Epic    Worried About Programme Researcher, Broadcasting/film/video in the Last Year: Never true    Ran Out of Food in the Last Year: Never true  Transportation Needs: No Transportation Needs (05/13/2024)   Epic    Lack of Transportation (Medical): No    Lack of Transportation (Non-Medical): No  Physical Activity: Not on file  Stress: Not on file  Social Connections: Patient Declined (05/13/2024)   Social Connection and Isolation Panel    Frequency of Communication with Friends and Family: Patient declined    Frequency of Social Gatherings with Friends and Family: Patient declined    Attends Religious Services: Patient declined    Database Administrator or Organizations: Patient declined    Attends Banker Meetings: Patient declined    Marital Status: Patient declined  Depression (PHQ2-9): Low Risk (04/19/2021)   Depression (PHQ2-9)    PHQ-2 Score: 0  Alcohol Screen: Not on file  Housing: Low Risk (05/13/2024)   Epic    Unable to Pay for Housing in the Last Year: No    Number of Times Moved in the Last Year: 0    Homeless in the Last Year: No  Utilities: Not At Risk (05/13/2024)   Epic    Threatened with loss of utilities: No  Health Literacy: Not on file   Allergies:  Allergies[1]  Objective:   Vital Signs:   Temp:  [97.9 F (36.6 C)-99.2 F (37.3 C)] 98 F (36.7 C) (12/29 0725) Pulse Rate:  [80-89] 83 (12/29 0725) Resp:  [18-20] 20 (12/29 0725) BP: (121-132)/(65-76) 131/66 (12/29 0725) SpO2:  [94 %-97 %] 95 % (12/29 0725) Weight:  [105.6 kg] 105.6 kg (12/29 0300) Last BM Date : 05/15/24  Weight change: Filed Weights   05/15/24 0300 05/16/24 0136 05/17/24 0300  Weight: 101.3 kg 106.7 kg 105.6 kg   Intake/Output:   Intake/Output Summary (Last 24 hours)  at 05/17/2024 0807 Last data filed at 05/17/2024 0500 Gross per 24 hour  Intake 1101.13 ml  Output 3525 ml  Net -2423.87 ml    Physical Exam  General:  tired appearing, sitting on EOB. + cough Cor: Regular rate & rhythm. No murmurs. JVD elevated.  Lungs: diminished bases Extremities: +3 BLE edema  Telemetry   NSR 70s 3-12 PVCs/hr (Personally reviewed)    EKG    ST 120s on admission  Labs   Basic Metabolic Panel: Recent Labs  Lab 05/13/24 0600 05/14/24 0738 05/15/24 0320 05/16/24 0259 05/17/24 0253  NA 142 139 140 138 140  K 3.8 3.5 3.7 3.2* 4.1  CL 107 105 104 104 105  CO2 23 23 28 25 25   GLUCOSE 110* 98 58* 133* 104*  BUN 27* 29* 29* 29* 36*  CREATININE 2.04* 1.81* 1.91* 2.09* 2.32*  CALCIUM  8.4* 8.2* 8.2* 8.0* 8.4*  MG 2.1 2.1  --  2.0  --     Liver Function Tests: Recent Labs  Lab 05/13/24 0600  AST 93*  ALT 23  ALKPHOS 104  BILITOT 0.4  PROT 6.4*  ALBUMIN 3.2*   No results for input(s): LIPASE, AMYLASE in the last 168 hours. No results for input(s): AMMONIA in the last 168 hours.  CBC: Recent Labs  Lab 05/13/24 0600 05/14/24 0328 05/15/24 0320 05/16/24 0259 05/17/24 0253  WBC 11.0* 8.5 8.5 6.1 7.1  NEUTROABS 9.0*  --   --   --   --   HGB 12.5* 11.4* 11.7* 11.3* 11.7*  HCT 40.0 35.5* 36.4* 35.5* 37.3*  MCV 84.0 80.9 81.6 82.0 82.3  PLT 206 191 199 180 203    Cardiac Enzymes: No results for input(s): CKTOTAL, CKMB, CKMBINDEX, TROPONINI in the last 168 hours.  BNP: BNP (last 3 results) Recent Labs    01/13/24 1208 01/30/24 1340  BNP 170.3* 119.0*    ProBNP (last 3 results) Recent Labs    05/13/24 0051  PROBNP 8,752.0*     CBG: Recent Labs  Lab 05/16/24 1158 05/16/24 1617 05/16/24 2053 05/16/24 2118 05/17/24 0525  GLUCAP 126* 157* 105* 106* 93    Coagulation Studies: No results for input(s): LABPROT, INR in the last 72 hours.   Imaging   US  EKG SITE RITE Result Date: 05/17/2024 If Site  Rite image not attached, placement could not be confirmed due to current cardiac rhythm.   Medications:   Current Medications:  allopurinol   100 mg Oral Daily   amiodarone   400 mg Oral BID   aspirin  EC  81 mg Oral Daily   empagliflozin   10 mg Oral Daily   furosemide   80 mg Intravenous BID   insulin  aspart  0-9 Units Subcutaneous TID WC   spironolactone   25 mg Oral Daily   tamsulosin   0.4 mg Oral Daily    Infusions:  heparin  1,200 Units/hr (05/17/24 0157)   iron  sucrose Stopped (05/16/24 1311)   Patient Profile   Thomas Gardner is a 68 y.o. male with iCM, chronic HFrEF, unvascularized 2v CAD (pRCA CTA and 70% pLAD/mLAD, 80% dLAD), HTN, HLD, DMII, CKD stage 3, morbid obesity, PVCs, asthma, prostate cancer s/p radiation, OSA, CVA (10/22') and carotid stenosis. AHF team consulted with A/C HFrEF and concerns of low output HF.   Assessment/Plan  A/C HFrEF with suspected low  output HF - Echo 05/13/24 EF <20%, LV with GHK, mild concentric LVH, RV normal, LA mod dilated, mild-mod MR, trivial TR - LHC 10/25: pRCA CTA and 70% pLAD/mLAD, 80% dLAD. Medical management - iCM. Repots long history of PVCs since he was young (PVC mediated?). Burden on zio in 24' 9.6% - NYHA IV on admission - Volume overloaded on exam.  Increase lasix  80>120 IV BID. Will also give 2.5 metolazone  this morning. Follow response, may need more this afternoon.  - Toprol  stopped today with suspected low output HF and volume overload - Plan for PICC placement today. Follow co-ox and CVP. Check lactic acid.  - Start milrinone  0.125. Will need inotrope support for diuresis.  - Continue BiDil  1 tab TID - GDMT limited with CKD - Continue Jardiance  10 mg daily, A1c 6.8 12/26 - Strict I&O, daily weights  - s/p IV iron , no cMRI - Consider RHC once diuresed.  - HIV (-). TSH WNL - Place UNNA boots.   CAD - Hx unvascularized 2v CAD (LHC 10/25: pRCA CTA and 70% pLAD/mLAD, 80% dLAD) - Primary team discussed revascularization  of LAD but opted for medical management/ GDMT optimization. Would be high risk PCI and require possible lithotripsy.  - Denies CP - Continue ASA - Continue repatha  (at discharge). Continue statin (did not tolerate crestor )  AKI on CKD stage 3 - Baseline SCr ~1.5 - up to 2.32 today - Follow with diuresis, avoid hypotension - Adding inotrope support as above  DMII - A1c 6.8 - Continue SGLT2i. Previously on mounjaro .   PVCs - PO amiodarone  stopped today - Follow PVC burden,  will restart IV with inotrope initiation - Needs CPAP, difficulty tolerating at home.   Pleural effusion - Noted on echo 05/13/24 - Large in L lateral region - Follow with diuresis - CXR 12/25 with layering bilateral pleural effusions with bibasilar atelectasis - Would repeat CXR once diuresed. May need tap  OSA - Needs to repeat Itamar, did not complete the one given OP - Will need to see pulm or Dr Shlomo at discharge to discuss further options as he did not tolerate CPAP.     Length of Stay: 4  Beckey LITTIE Coe, NP  05/17/2024, 8:07 AM  Advanced Heart Failure Team Pager [phone] (M-F; 7a - 5p)   Please visit [email or web address]: For overnight coverage please call cardiology fellow first. If fellow not available call Shock/ECMO MD on call.  For ECMO / Mechanical Support (Impella, IABP, LVAD) issues call Shock / ECMO MD on call.    Patient seen with NP, I formulated the plan and agree with the above note.   Patient with history as noted above was admitted with gradually worsening dyspnea and peripheral edema over about a month, considerably worse 3-4 days prior to admission.  No chest pain. HS-TnI 145 => 482. Creatinine today up to 2.32, rising with attempts at diuresis.  Concern for low output, PICC placed.  Lactate actually in normal range at 0.6.   Patient reports improving dyspnea but still with some shortness of breath and cough.   General: NAD Neck: JVP difficult, no thyromegaly or thyroid nodule.   Lungs: Decreased BS at bases.  CV: Nondisplaced PMI.  Heart regular S1/S2, no S3/S4, no murmur.  2+ edema to thighs.  No carotid bruit.  Normal pedal pulses.  Abdomen: Soft, mild diffuse tenderness, no hepatosplenomegaly, no distention.  Skin: Intact without lesions or rashes.  Neurologic: Alert and oriented x 3.  Psych: Normal affect. Extremities: No clubbing  or cyanosis.  HEENT: Normal.   1. 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 History of frequent PVCs (9.6% on Zio in 8/24) as well as HTN.  Echo back in 7/22 with EF 35-40%.  Echoes in 10/22 and 8/24 with EF 50%, echo in 8/25 with EF 35-40% led to cath showing occluded RCA with collaterals and long 70% proximal LAD stenosis (hemodynamically significant by FFR) as well as 80% distal LAD stenosis.  With this admission, echo showed EF <20% with moderate LV dilation, RV normal, IVC dilated.  EF is decreased out of proportion to cath.  Based on Revived-BCIS data, decision made not to intervene on the moderate (but hemodynamically significant) LAD stenosis given lack of ACS/chest pain.  He is volume overloaded on exam, creatinine rising with diuresis and concern for low output.  JVP is hard to assess but he has significant peripheral edema.  - PICC placed, will follow CVP and co-ox.  - Agree with milrinone  0.25 given rising creatinine and ongoing volume overload.  - Place Unna boots.  - Lasix  120 mg IV bid today with metolazone  2.5 x 1.  - Continue Bidil  1 tab tid - Continue Jardiance  10 daily.  - GDMT will be limited by AKI on CKD stage 3.  - He will need formal RHC eventually.  2. CAD: Cath 10/25 showed occluded RCA with collaterals and long 70% proximal LAD stenosis (hemodynamically significant by FFR) as well as 80% distal LAD stenosis.  Patient has not had chest pain, presentation not consistent with ACS.  Seen by Dr. Wendel initially, and based on Revived-BCIS data, decision made  not to intervene on the moderate (but hemodynamically significant) LAD stenosis given lack of ACS/chest pain. I think this is reasonable as I am not sure he would derive much benefit in absence of ACS.  - Continue ASA 81 - Continue atorvastatin  (on Repatha  at home).  3. AKI on CKD stage 3: Baseline creatinine around 1.6, up to 2.3 with attempts at diuresis.  Worry that this is cardiorenal syndrome.  - PICC placed, diurese based on CVP.  - Adding milrinone  as above.  4. H/o CVA in 10/22 with mod-severe right PCA stenosis on CTA head/neck.  - Continue ASA 81 - Continue atorvastatin  80, on Repatha  at home.  5. PVCs: H/o frequent PVCs, ?contribute to cardiomyopathy.   - On amiodarone  po currently, will make IV while on milrinone .  6. OSA: Did not tolerate CPAP.  7. Pleural effusions: Noted on CXR.  May need thoracentesis, follow with diuresis.   Ezra Shuck 05/17/2024 12:30 PM      [1]  Allergies Allergen Reactions   Erythromycin Nausea And Vomiting   Nsaids Other (See Comments)    Contraindication due to CKD    Onion Nausea And Vomiting   "

## 2024-05-17 NOTE — Progress Notes (Signed)
 Peripherally Inserted Central Catheter Placement  The IV Nurse has discussed with the patient and/or persons authorized to consent for the patient, the purpose of this procedure and the potential benefits and risks involved with this procedure.  The benefits include less needle sticks, lab draws from the catheter, and the patient may be discharged home with the catheter. Risks include, but not limited to, infection, bleeding, blood clot (thrombus formation), and puncture of an artery; nerve damage and irregular heartbeat and possibility to perform a PICC exchange if needed/ordered by physician.  Alternatives to this procedure were also discussed.  Bard Power PICC patient education guide, fact sheet on infection prevention and patient information card has been provided to patient /or left at bedside.    PICC Placement Documentation  PICC Double Lumen 05/17/24 Right Brachial 40 cm 0 cm (Active)  Indication for Insertion or Continuance of Line Vasoactive infusions 05/17/24 1100  Exposed Catheter (cm) 0 cm 05/17/24 1100  Site Assessment Clean, Dry, Intact 05/17/24 1100  Lumen #1 Status Flushed;Saline locked;Blood return noted 05/17/24 1100  Lumen #2 Status Flushed;Saline locked;Blood return noted 05/17/24 1100  Dressing Type Transparent;Securing device 05/17/24 1100  Dressing Status Antimicrobial disc/dressing in place 05/17/24 1100  Line Care Connections checked and tightened 05/17/24 1100  Line Adjustment (NICU/IV Team Only) No 05/17/24 1100  Dressing Intervention New dressing;Adhesive placed at insertion site (IV team only) 05/17/24 1100  Dressing Change Due 05/24/24 05/17/24 1100       Danicka Hourihan 05/17/2024, 11:49 AM

## 2024-05-18 ENCOUNTER — Inpatient Hospital Stay (HOSPITAL_COMMUNITY)

## 2024-05-18 ENCOUNTER — Encounter (HOSPITAL_COMMUNITY): Admission: EM | Disposition: A | Discharge status: Home Health | Payer: Self-pay | Source: Home / Self Care

## 2024-05-18 DIAGNOSIS — I5023 Acute on chronic systolic (congestive) heart failure: Secondary | ICD-10-CM | POA: Diagnosis not present

## 2024-05-18 HISTORY — PX: RIGHT HEART CATH: CATH118263

## 2024-05-18 LAB — BASIC METABOLIC PANEL WITH GFR
Anion gap: 9 (ref 5–15)
BUN: 47 mg/dL — ABNORMAL HIGH (ref 8–23)
CO2: 28 mmol/L (ref 22–32)
Calcium: 8.4 mg/dL — ABNORMAL LOW (ref 8.9–10.3)
Chloride: 103 mmol/L (ref 98–111)
Creatinine, Ser: 2.49 mg/dL — ABNORMAL HIGH (ref 0.61–1.24)
GFR, Estimated: 27 mL/min — ABNORMAL LOW
Glucose, Bld: 146 mg/dL — ABNORMAL HIGH (ref 70–99)
Potassium: 3.5 mmol/L (ref 3.5–5.1)
Sodium: 140 mmol/L (ref 135–145)

## 2024-05-18 LAB — POCT I-STAT EG7
Acid-Base Excess: 3 mmol/L — ABNORMAL HIGH (ref 0.0–2.0)
Acid-Base Excess: 3 mmol/L — ABNORMAL HIGH (ref 0.0–2.0)
Bicarbonate: 27.5 mmol/L (ref 20.0–28.0)
Bicarbonate: 27.6 mmol/L (ref 20.0–28.0)
Calcium, Ion: 1.18 mmol/L (ref 1.15–1.40)
Calcium, Ion: 1.2 mmol/L (ref 1.15–1.40)
HCT: 31 % — ABNORMAL LOW (ref 39.0–52.0)
HCT: 31 % — ABNORMAL LOW (ref 39.0–52.0)
Hemoglobin: 10.5 g/dL — ABNORMAL LOW (ref 13.0–17.0)
Hemoglobin: 10.5 g/dL — ABNORMAL LOW (ref 13.0–17.0)
O2 Saturation: 68 %
O2 Saturation: 68 %
Potassium: 3.3 mmol/L — ABNORMAL LOW (ref 3.5–5.1)
Potassium: 3.3 mmol/L — ABNORMAL LOW (ref 3.5–5.1)
Sodium: 140 mmol/L (ref 135–145)
Sodium: 140 mmol/L (ref 135–145)
TCO2: 29 mmol/L (ref 22–32)
TCO2: 29 mmol/L (ref 22–32)
pCO2, Ven: 40.8 mmHg — ABNORMAL LOW (ref 44–60)
pCO2, Ven: 40.9 mmHg — ABNORMAL LOW (ref 44–60)
pH, Ven: 7.436 — ABNORMAL HIGH (ref 7.25–7.43)
pH, Ven: 7.437 — ABNORMAL HIGH (ref 7.25–7.43)
pO2, Ven: 34 mmHg (ref 32–45)
pO2, Ven: 35 mmHg (ref 32–45)

## 2024-05-18 LAB — COOXEMETRY PANEL
Carboxyhemoglobin: 0.7 % (ref 0.5–1.5)
Methemoglobin: <0.7 % (ref 0.0–1.5)
O2 Saturation: 65.6 %
Total hemoglobin: 10.2 g/dL — ABNORMAL LOW (ref 12.0–16.0)

## 2024-05-18 LAB — GLUCOSE, CAPILLARY
Glucose-Capillary: 155 mg/dL — ABNORMAL HIGH (ref 70–99)
Glucose-Capillary: 141 mg/dL — ABNORMAL HIGH (ref 70–99)
Glucose-Capillary: 143 mg/dL — ABNORMAL HIGH (ref 70–99)
Glucose-Capillary: 225 mg/dL — ABNORMAL HIGH (ref 70–99)

## 2024-05-18 LAB — CBC
HCT: 31.5 % — ABNORMAL LOW (ref 39.0–52.0)
Hemoglobin: 10 g/dL — ABNORMAL LOW (ref 13.0–17.0)
MCH: 26.4 pg (ref 26.0–34.0)
MCHC: 31.7 g/dL (ref 30.0–36.0)
MCV: 83.1 fL (ref 80.0–100.0)
Platelets: 199 K/uL (ref 150–400)
RBC: 3.79 MIL/uL — ABNORMAL LOW (ref 4.22–5.81)
RDW: 14.9 % (ref 11.5–15.5)
WBC: 6.6 K/uL (ref 4.0–10.5)
nRBC: 0 % (ref 0.0–0.2)

## 2024-05-18 LAB — MAGNESIUM: Magnesium: 2.1 mg/dL (ref 1.7–2.4)

## 2024-05-18 MED ORDER — SODIUM CHLORIDE 0.9 % IV SOLN: 250.0000 mL | INTRAVENOUS | Status: DC | PRN

## 2024-05-18 MED ORDER — LIDOCAINE HCL (PF) 1 % IJ SOLN
INTRAMUSCULAR | Status: DC | PRN
  Administered 2024-05-18: 2 mL

## 2024-05-18 MED ORDER — LIDOCAINE HCL (PF) 1 % IJ SOLN
INTRAMUSCULAR | Status: AC
  Filled 2024-05-18: qty 30

## 2024-05-18 MED ORDER — POTASSIUM CHLORIDE CRYS ER 20 MEQ PO TBCR
40.0000 meq | EXTENDED_RELEASE_TABLET | Freq: Once | ORAL | Status: AC
  Administered 2024-05-18: 40 meq via ORAL
  Filled 2024-05-18: qty 2

## 2024-05-18 MED ORDER — SODIUM CHLORIDE 0.9% FLUSH: 3.0000 mL | Freq: Two times a day (BID) | INTRAVENOUS | Status: DC

## 2024-05-18 MED ORDER — FERROUS SULFATE 325 (65 FE) MG PO TABS
325.0000 mg | ORAL_TABLET | Freq: Every day | ORAL | Status: DC
  Administered 2024-05-19 – 2024-05-21 (×3): 325 mg via ORAL
  Filled 2024-05-18 (×3): qty 1

## 2024-05-18 MED ORDER — POTASSIUM CHLORIDE CRYS ER 20 MEQ PO TBCR: 30.0000 meq | EXTENDED_RELEASE_TABLET | Freq: Once | ORAL | Status: DC

## 2024-05-18 MED ORDER — SODIUM CHLORIDE 0.9% FLUSH: 3.0000 mL | INTRAVENOUS | Status: DC | PRN

## 2024-05-18 NOTE — Progress Notes (Signed)
 " PROGRESS NOTE    Thomas Gardner  FMW:[id number] DOB: 12/04/1955 DOA: 05/13/2024 PCP: Maree Leni Edyth DELENA, MD  Subjective: No acute events overnight. Seen and examined at bedside with friend present after cardiac catheterization procedure. Reports feeling better with improvement in shortness of breath and overall strength. Denies nausea, vomiting, constipation.   Hospital Course:  68 y.o. male with history of chronic HFrEF last EF measured on September 2025 was 35 to 40%, has had cardiac cath in October 2025 which showedDist LAD lesion is 80% stenosed,Prox RCA lesion is 100% stenosed, Prox LAD to Mid LAD lesion is 70% stenose, diabetes mellitus type 2, chronic disease stage III, gout, sleep apnea, history of prostate cancer, history of stroke presents to the ER because of increasing shortness of breath with exertion over the last 2 to 3 days.  Patient states he also has been having some orthopnea and has noticed some weight gain and lower extremity edema more than usual.  Over the last one week patient has been having productive cough denies any fever or chills.  He has been taking Lasix  despite which he was getting more short of breath and decided to come to the ER.  He also has been having chest tightness on exertion last few days.   Admitted for Acute CHF exacerbation, NSTEMI, AKI on CKD, and Iron  deficiency anemia.   Assessment and Plan:  Acute on chronic HFrEF  - last EF measured in September 2025 was 35 to 40%.  - TTE 12/25 showed LVEF < 20%, global hypokinesis, dilated LV, mild concentric LVH, RV size/systolic function normal, moderately dilated LA, mild to moderate MR, dilated IVC with <50% respiratory variability - s/p RHC 12/30 with normal pressures   - Patient takes Lasix  20 mg at home.  - cont milrinone  drip - stop lasix   - cont amiodarone  drip as per cardio recs - Continue jardiance   - stopped spironolactone  given AKI - hold home metoprolol  and Entresto  - s/p PICC for CVP  monitoring - monitor I/Os, daily weights, renal function, electrolytes - monitor on tele - HF team following - cardiology following   Elevated troponin NSTEMI  - concern for non-ST elevation MI given the recent cardiac cath showing severe disease that was not stented with plan to manage medically (Dist LAD lesion is 80% stenosed, prox RCA lesion is 100% stenosed, prox LAD to Mid LAD lesion is 70% stenoses) - TTE showed LVEF < 20%, global hypokinesis, dilated LV, mild concentric LVH, RV size/systolic function normal, moderately dilated LA, mild to moderate MR, dilated IVC with <50% respiratory variability - as per patient, history of myalgias with statins - cont atorvastatin  as per cardio recs - home repatha  on hold as non-formulary - hold home metoprolol  - stopped heparin  infusion as per cardio recs - continue with aspirin   - monitor on tele - cardiology following   Acute on chronic kidney disease stage III  - creatinine 2.49 < 1.8, 2.06 on admission, baseline around 1.7 - Possible cardiorenal syndrome.  - stopped diuresis after cardiac cath with normal filling pressures - hold home entresto  and spironolactone  - monitor I/Os, renal function    Anemia Iron  deficiency  - no signs/symptoms of acute bleed - Follow CBC  - iron  studies confirmed iron  deficiency - finished IV iron  5-days course - start PO iron      Presumed pneumonia  - patient presented with productive cough  - CXR showed bilateral pleural effusion with associated atelectasis vs PNA - procalcitonin not elevated - stopped  empiric antibiotics  - monitor clinically   Diabetes mellitus type 2  - HGbA1c 6.8 - takes Lantus .  - hold home Mounjaro    - cont Jardiance   - continue home dose of Lantus  with sliding scale coverage.    History of gout on allopurinol .   Prior history of stroke on aspirin  and Repatha .   Sleep apnea CPAP at bedtime.   DVT prophylaxis: enoxaparin  (LOVENOX ) injection 40 mg Start:  05/18/24 1000      Code Status: Full Code Family Communication: updated friend at bedside during encounter Disposition Plan: TBD Reason for continuing need for hospitalization: severity of illness, PT eval pending  Objective: Vitals:   05/18/24 0942 05/18/24 0947 05/18/24 1004 05/18/24 1204  BP: (!) 144/66 (!) 143/61 137/66 (!) 145/111  Pulse: 81 82 88   Resp: 19 15 17    Temp:      TempSrc:      SpO2: 95% 96%    Weight:      Height:        Intake/Output Summary (Last 24 hours) at 05/18/2024 1432 Last data filed at 05/18/2024 1349 Gross per 24 hour  Intake 1960.77 ml  Output 3985 ml  Net -2024.23 ml   Filed Weights   05/16/24 0136 05/17/24 0300 05/18/24 0300  Weight: 106.7 kg 105.6 kg 104.9 kg    Examination:  Physical Exam Vitals and nursing note reviewed.  Constitutional:      General: He is not in acute distress.    Appearance: He is obese. He is ill-appearing.  HENT:     Head: Normocephalic and atraumatic.  Cardiovascular:     Rate and Rhythm: Normal rate and regular rhythm.     Pulses: Normal pulses.     Heart sounds: Normal heart sounds.  Pulmonary:     Effort: Pulmonary effort is normal.     Breath sounds: Normal breath sounds.  Abdominal:     General: Bowel sounds are normal.     Palpations: Abdomen is soft.  Musculoskeletal:     Right lower leg: Edema present.     Left lower leg: Edema present.  Neurological:     Mental Status: He is alert. Mental status is at baseline.     Data Reviewed: I have personally reviewed following labs and imaging studies  CBC: Recent Labs  Lab 05/13/24 0600 05/14/24 0328 05/15/24 0320 05/16/24 0259 05/17/24 0253 05/18/24 0659  WBC 11.0* 8.5 8.5 6.1 7.1 6.6  NEUTROABS 9.0*  --   --   --   --   --   HGB 12.5* 11.4* 11.7* 11.3* 11.7* 10.0*  HCT 40.0 35.5* 36.4* 35.5* 37.3* 31.5*  MCV 84.0 80.9 81.6 82.0 82.3 83.1  PLT 206 191 199 180 203 199   Basic Metabolic Panel: Recent Labs  Lab 05/13/24 0600  05/14/24 0738 05/15/24 0320 05/16/24 0259 05/17/24 0253 05/18/24 0659  NA 142 139 140 138 140 140  K 3.8 3.5 3.7 3.2* 4.1 3.5  CL 107 105 104 104 105 103  CO2 23 23 28 25 25 28   GLUCOSE 110* 98 58* 133* 104* 146*  BUN 27* 29* 29* 29* 36* 47*  CREATININE 2.04* 1.81* 1.91* 2.09* 2.32* 2.49*  CALCIUM  8.4* 8.2* 8.2* 8.0* 8.4* 8.4*  MG 2.1 2.1  --  2.0  --  2.1   GFR: Estimated Creatinine Clearance: 32.2 mL/min (A) (by C-G formula based on SCr of 2.49 mg/dL (H)). Liver Function Tests: Recent Labs  Lab 05/13/24 0600  AST 93*  ALT  23  ALKPHOS 104  BILITOT 0.4  PROT 6.4*  ALBUMIN 3.2*   No results for input(s): LIPASE, AMYLASE in the last 168 hours. No results for input(s): AMMONIA in the last 168 hours. Coagulation Profile: No results for input(s): INR, PROTIME in the last 168 hours. Cardiac Enzymes: No results for input(s): CKTOTAL, CKMB, CKMBINDEX, TROPONINI in the last 168 hours. ProBNP, BNP (last 5 results) Recent Labs    01/13/24 1208 01/30/24 1340 05/13/24 0051  PROBNP  --   --  8,752.0*  BNP 170.3* 119.0*  --    HbA1C: No results for input(s): HGBA1C in the last 72 hours. CBG: Recent Labs  Lab 05/17/24 1147 05/17/24 1515 05/17/24 2039 05/18/24 0544 05/18/24 1214  GLUCAP 90 191* 180* 155* 141*   Lipid Profile: No results for input(s): CHOL, HDL, LDLCALC, TRIG, CHOLHDL, LDLDIRECT in the last 72 hours. Thyroid Function Tests: No results for input(s): TSH, T4TOTAL, FREET4, T3FREE, THYROIDAB in the last 72 hours. Anemia Panel: No results for input(s): VITAMINB12, FOLATE, FERRITIN, TIBC, IRON , RETICCTPCT in the last 72 hours. Sepsis Labs: Recent Labs  Lab 05/13/24 0600 05/17/24 0949  PROCALCITON 0.60  --   LATICACIDVEN  --  0.6    Recent Results (from the past 240 hours)  Resp panel by RT-PCR (RSV, Flu A&B, Covid) Anterior Nasal Swab     Status: None   Collection Time: 05/13/24  3:33 AM    Specimen: Anterior Nasal Swab  Result Value Ref Range Status   SARS Coronavirus 2 by RT PCR NEGATIVE NEGATIVE Final   Influenza A by PCR NEGATIVE NEGATIVE Final   Influenza B by PCR NEGATIVE NEGATIVE Final    Comment: (NOTE) The Xpert Xpress SARS-CoV-2/FLU/RSV plus assay is intended as an aid in the diagnosis of influenza from Nasopharyngeal swab specimens and should not be used as a sole basis for treatment. Nasal washings and aspirates are unacceptable for Xpert Xpress SARS-CoV-2/FLU/RSV testing.  Fact Sheet for Patients: bloggercourse.com  Fact Sheet for Healthcare Providers: seriousbroker.it  This test is not yet approved or cleared by the [place]  FDA and has been authorized for detection and/or diagnosis of SARS-CoV-2 by FDA under an Emergency Use Authorization (EUA). This EUA will remain in effect (meaning this test can be used) for the duration of the COVID-19 declaration under Section 564(b)(1) of the Act, 21 U.S.C. section 360bbb-3(b)(1), unless the authorization is terminated or revoked.     Resp Syncytial Virus by PCR NEGATIVE NEGATIVE Final    Comment: (NOTE) Fact Sheet for Patients: bloggercourse.com  Fact Sheet for Healthcare Providers: seriousbroker.it  This test is not yet approved or cleared by the [place]  FDA and has been authorized for detection and/or diagnosis of SARS-CoV-2 by FDA under an Emergency Use Authorization (EUA). This EUA will remain in effect (meaning this test can be used) for the duration of the COVID-19 declaration under Section 564(b)(1) of the Act, 21 U.S.C. section 360bbb-3(b)(1), unless the authorization is terminated or revoked.  Performed at [facility], [address], [place], [place] [address]      Radiology Studies: CARDIAC CATHETERIZATION Result Date: 05/18/2024 1. Normal filling pressure and PA pressure.  2. Normal cardiac output on milrinone .   VAS US  LOWER EXTREMITY VENOUS (DVT) Result Date: 05/18/2024  Lower Venous DVT Study Patient Name:  BORA BRONER  Date of Exam:   05/17/2024 Medical Rec #: [id number]        Accession #:    [id number] Date of Birth: [birth year]  Patient Gender: M Patient Age:   68 years Exam Location:  [facility] Procedure:      VAS US  LOWER EXTREMITY VENOUS (DVT) Referring Phys: NORVAL BAR --------------------------------------------------------------------------------  Indications: Edema. Other Indications: Acute on chronic CHF. Limitations: Poor ultrasound/tissue interface. Comparison Study: No previous exams Performing Technologist: Jody Hill RVT, RDMS  Examination Guidelines: A complete evaluation includes B-mode imaging, spectral Doppler, color Doppler, and power Doppler as needed of all accessible portions of each vessel. Bilateral testing is considered an integral part of a complete examination. Limited examinations for reoccurring indications may be performed as noted. The reflux portion of the exam is performed with the patient in reverse Trendelenburg.  +---------+---------------+---------+-----------+----------+--------------+ RIGHT    CompressibilityPhasicitySpontaneityPropertiesThrombus Aging +---------+---------------+---------+-----------+----------+--------------+ CFV      Full           Yes      Yes                                 +---------+---------------+---------+-----------+----------+--------------+ SFJ      Full                                                        +---------+---------------+---------+-----------+----------+--------------+ FV Prox  Full           Yes      Yes                                 +---------+---------------+---------+-----------+----------+--------------+ FV Mid   Full           Yes      Yes                                  +---------+---------------+---------+-----------+----------+--------------+ FV DistalFull           Yes      Yes                                 +---------+---------------+---------+-----------+----------+--------------+ PFV      Full                                                        +---------+---------------+---------+-----------+----------+--------------+ POP      Full           Yes      Yes                                 +---------+---------------+---------+-----------+----------+--------------+ PTV      Full                                                        +---------+---------------+---------+-----------+----------+--------------+ PERO     Full                                                        +---------+---------------+---------+-----------+----------+--------------+   +---------+---------------+---------+-----------+----------+-------------------+  LEFT     CompressibilityPhasicitySpontaneityPropertiesThrombus Aging      +---------+---------------+---------+-----------+----------+-------------------+ CFV      Full           Yes      Yes                                      +---------+---------------+---------+-----------+----------+-------------------+ SFJ      Full                                                             +---------+---------------+---------+-----------+----------+-------------------+ FV Prox  Full           Yes      Yes                                      +---------+---------------+---------+-----------+----------+-------------------+ FV Mid   Full           Yes      Yes                                      +---------+---------------+---------+-----------+----------+-------------------+ FV DistalFull           Yes      Yes                                      +---------+---------------+---------+-----------+----------+-------------------+ PFV      Full                                                              +---------+---------------+---------+-----------+----------+-------------------+ POP      Full           Yes      Yes                                      +---------+---------------+---------+-----------+----------+-------------------+ PTV                                                   Not well visualized +---------+---------------+---------+-----------+----------+-------------------+ PERO     Full                                                             +---------+---------------+---------+-----------+----------+-------------------+     Summary: BILATERAL: - No evidence of deep vein thrombosis seen in the lower extremities, bilaterally. -No evidence of popliteal cyst, bilaterally.   *See  table(s) above for measurements and observations. Electronically signed by Fonda Rim on 05/18/2024 at 9:24:21 AM.    Final    US  EKG SITE RITE Result Date: 05/17/2024 If Site Rite image not attached, placement could not be confirmed due to current cardiac rhythm.   Scheduled Meds:  allopurinol   100 mg Oral Daily   aspirin  EC  81 mg Oral Daily   atorvastatin   80 mg Oral Daily   Chlorhexidine  Gluconate Cloth  6 each Topical Daily   empagliflozin   10 mg Oral Daily   enoxaparin  (LOVENOX ) injection  40 mg Subcutaneous Daily   insulin  aspart  0-9 Units Subcutaneous TID WC   isosorbide -hydrALAZINE   1 tablet Oral TID   sodium chloride  flush  10-40 mL Intracatheter Q12H   tamsulosin   0.4 mg Oral Daily   Continuous Infusions:  amiodarone  30 mg/hr (05/18/24 1357)   milrinone  0.125 mcg/kg/min (05/18/24 1359)     LOS: 5 days   Norval Bar, MD  [facility]  05/18/2024, 2:32 PM   "

## 2024-05-18 NOTE — Progress Notes (Signed)
 Mobility Specialist Progress Note:    05/18/24 1347  Mobility  Activity Turned to back - supine (Ankle Pumps, Leg Lifts, Heel Slides)  Level of Assistance Standby assist, set-up cues, supervision of patient - no hands on  Assistive Device None  Range of Motion/Exercises Active  Activity Response Tolerated fair  Mobility Referral Yes  Mobility visit 1 Mobility  Mobility Specialist Start Time (ACUTE ONLY) 1347  Mobility Specialist Stop Time (ACUTE ONLY) 1357  Mobility Specialist Time Calculation (min) (ACUTE ONLY) 10 min   Received pt laying in bed agreeable to session. No c/o any symptoms and pt feeling a lot better compared to previous session. Pt able to move and perform exercises w/ no issues. Left pt in bed w/ all needs met.   Venetia Keel Mobility Specialist Please Neurosurgeon or Rehab Office at [phone]

## 2024-05-18 NOTE — Progress Notes (Signed)
 Transported off unit at this time to Radiology.

## 2024-05-18 NOTE — TOC Initial Note (Signed)
 Transition of Care [facility]) - Initial/Assessment Note    Patient Details  Name: Thomas Gardner MRN: [id number] Date of Birth: Aug 16, 1955  [facility] CM/SW Contact:    Justina Delcia Czar, RN Phone Number: [phone] 05/18/2024, 12:53 PM  Clinical Narrative:                 Spoke to pt and states he was independent pta. Drives to appts. Has cane at home.  Patient has a scale for daily weight monitoring and possesses the Living Better with Heart Failure booklet. Patient educated on the importance of daily weights, medication adherence, and following a low-sodium, heart-healthy diet.   Chart reviewed for discharge readiness, patient not medically stable for d/c. Inpatient CM/CSW will continue to monitor pt's advancement through interdisciplinary progression rounds.   If new pt transition needs arise, MD please place a TOC consult.    Expected Discharge Plan: Home/Self Care Barriers to Discharge: Continued Medical Work up   Patient Goals and CMS Choice Patient states their goals for this hospitalization and ongoing recovery are:: wants to recover    Expected Discharge Plan and Services   Discharge Planning Services: CM Consult   Living arrangements for the past 2 months: Single Family Home    Prior Living Arrangements/Services Living arrangements for the past 2 months: Single Family Home Lives with:: Self Patient language and need for interpreter reviewed:: Yes Do you feel safe going back to the place where you live?: Yes      Need for Family Participation in Patient Care: No (Comment) Care giver support system in place?: No (comment) Current home services: DME (cane, scale) Criminal Activity/Legal Involvement Pertinent to Current Situation/Hospitalization: No - Comment as needed  Activities of Daily Living   ADL Screening (condition at time of admission) Independently performs ADLs?: Yes (appropriate for developmental age) Is the patient deaf or have  difficulty hearing?: No Does the patient have difficulty seeing, even when wearing glasses/contacts?: No Does the patient have difficulty concentrating, remembering, or making decisions?: No  Permission Sought/Granted Permission sought to share information with : Case Manager, Family Supports, PCP Permission granted to share information with : Yes, Verbal Permission Granted  Share Information with NAME: Horacio Werth  Permission granted to share info w AGENCY: PCP, DME  Permission granted to share info w Relationship: brother  Permission granted to share info w Contact Information: [phone]  Emotional Assessment Appearance:: Appears stated age Attitude/Demeanor/Rapport: Engaged Affect (typically observed): Accepting Orientation: : Oriented to Self, Oriented to Place, Oriented to  Time, Oriented to Situation   Psych Involvement: No (comment)  Admission diagnosis:  Acute respiratory failure with hypoxia (HCC) [J96.01] Acute on chronic HFrEF (heart failure with reduced ejection fraction) (HCC) [I50.23] Acute on chronic congestive heart failure, unspecified heart failure type [facility]) [I50.9] Patient Active Problem List   Diagnosis Date Noted   Type 2 diabetes mellitus with hyperglycemia (HCC) 05/13/2024   Acute on chronic HFrEF (heart failure with reduced ejection fraction) (HCC) 05/13/2024   Acute renal failure superimposed on stage 3b chronic kidney disease (HCC) 05/13/2024   Anemia due to chronic kidney disease 05/13/2024   Elevated troponin 05/13/2024   CAP (community acquired pneumonia) 05/13/2024   Acute on chronic congestive heart failure (HCC) 05/13/2024   Myocardial injury 05/13/2024   Coronary artery disease involving coronary bypass graft of native heart without angina pectoris 05/13/2024   Neuropathy 03/28/2023   Malignant neoplasm of prostate (HCC) 10/24/2021   Familial hyperlipidemia 05/07/2021   Cerebrovascular  accident (CVA) due to thrombosis of left posterior cerebral  artery (HCC) 04/19/2021   Thyroid nodule 03/23/2021   Hiatal hernia 03/23/2021   CVA (cerebral vascular accident) (HCC) 03/03/2021   Post-nasal drip 08/18/2020   Fatigue 04/26/2020   Epidermal inclusion cyst 04/03/2020   Generalized anxiety disorder 03/27/2020   Neck mass 01/19/2020   Epigastric pain 04/20/2019   Diabetes mellitus due to underlying condition with hyperosmolarity without coma, with long-term current use of insulin  (HCC) 04/19/2019   History of phimosis of penis 04/07/2018   Sciatica associated with disorder of lumbosacral spine 08/07/2016   Sleep apnea 02/01/2016   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   Healthcare maintenance 03/17/2015   Gout 08/08/2014   Hyperlipidemia 06/15/2014   Chronic HFrEF (heart failure with reduced ejection fraction) (HCC) 05/03/2014   Diabetes mellitus (HCC) 05/03/2014   Essential hypertension 05/03/2014   Morbid obesity (HCC) 10/10/2011   PCP:  Maree Leni Edyth DELENA, MD Pharmacy:   [facility] [address] [place] [address] Phone: [phone] Fax: [phone]     Social Drivers of Health (SDOH) Social History: SDOH Screenings   Food Insecurity: No Food Insecurity (05/13/2024)  Housing: Low Risk (05/13/2024)  Transportation Needs: No Transportation Needs (05/13/2024)  Utilities: Not At Risk (05/13/2024)  Depression (PHQ2-9): Low Risk (04/19/2021)  Social Connections: Patient Declined (05/13/2024)  Tobacco Use: Low Risk (05/13/2024)   SDOH Interventions:     Readmission Risk Interventions     No data to display

## 2024-05-18 NOTE — Plan of Care (Signed)

## 2024-05-18 NOTE — Progress Notes (Signed)
 OT Cancellation Note  Patient Details Name: Thomas Gardner MRN: [id number] DOB: 03-21-1956   Cancelled Treatment:    Reason Eval/Treat Not Completed: Patient at procedure or test/ unavailable. An OT staff member to check back at later date/time as pt. Available for participation in skilled therapy treatment session.    CHRISTELLA Nest Lorraine-COTA/L  05/18/2024, 9:54 AM

## 2024-05-18 NOTE — Progress Notes (Signed)
 Arrived back to unit after Cardiac Cath lab.

## 2024-05-18 NOTE — Progress Notes (Signed)
 Transported off unit at this time via bed to cath lab.  Escorted by Cath Lab staff x2.  Provided new bags of amiodarone  and milrinone 

## 2024-05-18 NOTE — Progress Notes (Addendum)
 "    Advanced Heart Failure Rounding Note  Cardiologist: Annabella Scarce, MD  [facility] Cardiologist: Dr. Rolan Chief Complaint: A/C HFrEf Patient Profile   Thomas Gardner is a 68 y.o. male with iCM, chronic HFrEF, unvascularized 2v CAD (pRCA CTA and 70% pLAD/mLAD, 80% dLAD), HTN, HLD, DMII, CKD stage 3, morbid obesity, PVCs, asthma, prostate cancer s/p radiation, OSA, CVA (10/22') and carotid stenosis.   Significant events:   05/17/24: AHF team consulted for concerns of low output HF. PICC placed. Milrinone  started. Diuretics increased.   Subjective:    -3.3L UOP (some unmeasured d/t urgency). Weight down 1 lb.   Co-ox 66% on milrinone  0.125  SCr 2.32>2.49  Feels symptomatically better this morning, easier to breath.   Objective:   Weight Range: 104.9 kg Body mass index is 37.33 kg/m.   Vital Signs:   Temp:  [97.4 F (36.3 C)-98 F (36.7 C)] 98 F (36.7 C) (12/30 0730) Pulse Rate:  [76-93] 76 (12/30 0730) Resp:  [19-20] 19 (12/30 0730) BP: (103-146)/(59-80) 122/59 (12/30 0730) SpO2:  [93 %-100 %] 94 % (12/30 0730) Weight:  [104.9 kg] 104.9 kg (12/30 0300) Last BM Date : 05/17/24  Weight change: Filed Weights   05/16/24 0136 05/17/24 0300 05/18/24 0300  Weight: 106.7 kg 105.6 kg 104.9 kg    Intake/Output:   Intake/Output Summary (Last 24 hours) at 05/18/2024 0759 Last data filed at 05/18/2024 0735 Gross per 24 hour  Intake 1516.6 ml  Output 3085 ml  Net -1568.4 ml     Physical Exam   General:  well appearing.   Cor: Regular rate & rhythm. No murmurs. JVD difficult to see.  Lungs: clear Extremities: +2 BLE edema. + UNNA boots  Telemetry   NSR 70s 5-10 PVCs (Personally reviewed)    Labs   CBC Recent Labs    05/17/24 0253 05/18/24 0659  WBC 7.1 6.6  HGB 11.7* 10.0*  HCT 37.3* 31.5*  MCV 82.3 83.1  PLT 203 199   Basic Metabolic Panel Recent Labs    87/71/74 0259 05/17/24 0253 05/18/24 0659  NA 138 140 140  K 3.2* 4.1 3.5  CL 104 105  103  CO2 25 25 28   GLUCOSE 133* 104* 146*  BUN 29* 36* 47*  CREATININE 2.09* 2.32* 2.49*  CALCIUM  8.0* 8.4* 8.4*  MG 2.0  --  2.1   Liver Function Tests No results for input(s): AST, ALT, ALKPHOS, BILITOT, PROT, ALBUMIN in the last 72 hours. No results for input(s): LIPASE, AMYLASE in the last 72 hours. Cardiac Enzymes No results for input(s): CKTOTAL, CKMB, CKMBINDEX, TROPONINI in the last 72 hours.  BNP: BNP (last 3 results) Recent Labs    01/13/24 1208 01/30/24 1340  BNP 170.3* 119.0*    ProBNP (last 3 results) Recent Labs    05/13/24 0051  PROBNP 8,752.0*     D-Dimer No results for input(s): DDIMER in the last 72 hours. Hemoglobin A1C No results for input(s): HGBA1C in the last 72 hours. Fasting Lipid Panel No results for input(s): CHOL, HDL, LDLCALC, TRIG, CHOLHDL, LDLDIRECT in the last 72 hours. Medications:   Scheduled Medications:  allopurinol   100 mg Oral Daily   aspirin  EC  81 mg Oral Daily   atorvastatin   80 mg Oral Daily   Chlorhexidine  Gluconate Cloth  6 each Topical Daily   empagliflozin   10 mg Oral Daily   enoxaparin  (LOVENOX ) injection  40 mg Subcutaneous Daily   insulin  aspart  0-9 Units Subcutaneous TID WC  isosorbide -hydrALAZINE   1 tablet Oral TID   sodium chloride  flush  10-40 mL Intracatheter Q12H   tamsulosin   0.4 mg Oral Daily    Infusions:  amiodarone  30 mg/hr (05/18/24 0735)   furosemide  120 mg (05/18/24 0757)   milrinone  0.1231 mcg/kg/min (05/18/24 0735)    PRN Medications: acetaminophen  **OR** acetaminophen , albuterol , guaiFENesin -dextromethorphan , lip balm, loratadine , senna, sodium chloride  flush  Assessment/Plan  1. 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 History of frequent PVCs (9.6% on Zio in 8/24) as well as HTN.  Echo back in 7/22 with EF 35-40%.  Echoes in 10/22 and 8/24 with EF 50%, echo in 8/25 with EF 35-40% led to  cath showing occluded RCA with collaterals and long 70% proximal LAD stenosis (hemodynamically significant by FFR) as well as 80% distal LAD stenosis.  With this admission, echo showed EF <20% with moderate LV dilation, RV normal, IVC dilated.  EF is decreased out of proportion to cath.  Based on Revived-BCIS data, decision made not to intervene on the moderate (but hemodynamically significant) LAD stenosis given lack of ACS/chest pain.   - Remains volume overloaded on exam though CVP low (<5). Will give morning dose of lasix  120 mg x1 and stop after. Will reassess post RHC.  - co-ox 66%, on milrinone  0.125. - Continue Bidil  1 tab tid - Continue Jardiance  10 daily.  - GDMT will be limited by AKI on CKD stage 3.  - RHC today to better assess hemodynamics.  Informed Consent   Shared Decision Making/Informed Consent The risks [stroke (1 in 1000), death (1 in 1000), kidney failure [usually temporary] (1 in 500), bleeding (1 in 200), allergic reaction [possibly serious] (1 in 200)], benefits (diagnostic support and management of coronary artery disease) and alternatives of a cardiac catheterization were discussed in detail with Thomas Gardner and he is willing to proceed.      2. CAD: Cath 10/25 showed occluded RCA with collaterals and long 70% proximal LAD stenosis (hemodynamically significant by FFR) as well as 80% distal LAD stenosis.  Patient has not had chest pain, presentation not consistent with ACS.  Seen by Dr. Wendel initially, and based on Revived-BCIS data, decision made not to intervene on the moderate (but hemodynamically significant) LAD stenosis given lack of ACS/chest pain. I think this is reasonable as I am not sure he would derive much benefit in absence of ACS.  - Continue ASA 81 - Continue atorvastatin  (on Repatha  at home).   3. AKI on CKD stage 3: Baseline creatinine around 1.6, up to 2.3 with attempts at diuresis.  Worry that this is cardiorenal syndrome.  - Hold further diuretics  after morning dose and reassess post RHC.  - Continue milrinone .   4. H/o CVA in 10/22 with mod-severe right PCA stenosis on CTA head/neck.  - Continue ASA 81 - Continue atorvastatin  80, on Repatha  at home.   5. PVCs: H/o frequent PVCs, ?contribute to cardiomyopathy.   - Back on IV amiodarone . PVC burden stable on tele review.   6. OSA: Did not tolerate CPAP. Needs repeat OP sleep study.   7. Pleural effusions: Noted on CXR.  May need thoracentesis, follow with diuresis.      Length of Stay: 5  Beckey LITTIE Coe, NP  05/18/2024, 7:59 AM  Advanced Heart Failure Team Pager [phone] (M-F; 7a - 5p)   Please visit [email or web address]: For overnight coverage please call cardiology fellow first. If fellow not available call Shock/ECMO MD on call.  For ECMO /  Mechanical Support (Impella, IABP, LVAD) issues call Shock / ECMO MD on call.    Patient seen with NP, I formulated the plan and agree with the above note.  Good diuresis yesterday, I/Os net negative 1909 cc.  Creatinine up to 2.49.  Co-ox 46% yesterday, up to 66% today on milrinone  0.125.   RHC today on milrinone  0.125: Hemodynamics (mmHg) RA mean 6 RV 35/6 PA 33/17, mean 21 PCWP mean 12 Oxygen saturations: PA 68% AO 94% Cardiac Output (Fick) 8.01  Cardiac Index (Fick) 3.76  PVR 1.1 WU Cardiac Output (Thermo) 6.32  Cardiac Index (Thermo) 2.97 PVR 1.4 WU PAPi 2.7   General: NAD Neck: No JVD, no thyromegaly or thyroid nodule.  Lungs: Clear to auscultation bilaterally with normal respiratory effort. CV: Nondisplaced PMI.  Heart regular S1/S2, no S3/S4, no murmur.  1+ edema to knees.   Abdomen: Soft, nontender, no hepatosplenomegaly, no distention.  Skin: Intact without lesions or rashes.  Neurologic: Alert and oriented x 3.  Psych: Normal affect. Extremities: No clubbing or cyanosis.  HEENT: Normal.   1. 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 History of frequent PVCs  (9.6% on Zio in 8/24) as well as HTN.  Echo back in 7/22 with EF 35-40%.  Echoes in 10/22 and 8/24 with EF 50%, echo in 8/25 with EF 35-40% led to cath showing occluded RCA with collaterals and long 70% proximal LAD stenosis (hemodynamically significant by FFR) as well as 80% distal LAD stenosis.  With this admission, echo showed EF <20% with moderate LV dilation, RV normal, IVC dilated.  EF is decreased out of proportion to cath.  Based on Revived-BCIS data, decision made not to intervene on the moderate (but hemodynamically significant) LAD stenosis given lack of ACS/chest pain.  Milrinone  begun 12/29 (co-ox 46%).  He has diuresed well but creatinine also up to 2.49. RHC today on milrinone  0.125 showed good CO and normal filling pressures.  He has residual peripheral edema that is likely due to venous insufficiency.  - Stop Lasix .   - Continue milrinone  today to allow creatinine to stabilize, hopefully stop tomorrow.  - Continue Unna boots.  - Continue Bidil  1 tab tid - Continue Jardiance  10 daily.  - GDMT will be limited by AKI on CKD stage 3.  2. CAD: Cath 10/25 showed occluded RCA with collaterals and long 70% proximal LAD stenosis (hemodynamically significant by FFR) as well as 80% distal LAD stenosis.  Patient has not had chest pain, presentation not consistent with ACS.  Seen by Dr. Wendel initially, and based on Revived-BCIS data, decision made not to intervene on the moderate (but hemodynamically significant) LAD stenosis given lack of ACS/chest pain. I think this is reasonable as I am not sure he would derive much benefit in absence of ACS.  - Continue ASA 81 - Continue atorvastatin  (on Repatha  at home).  3. AKI on CKD stage 3: Baseline creatinine around 1.6, up to 2.49 today.  Suspect cardiorenal syndrome. Normal filling pressures on RHC today.  - Stop Lasix .  - Continue milrinone  today to allow creatinine to stabilize.  4. H/o CVA in 10/22 with mod-severe right PCA stenosis on CTA  head/neck.  - Continue ASA 81 - Continue atorvastatin  80, on Repatha  at home.  5. PVCs: H/o frequent PVCs, ?contribute to cardiomyopathy.   - On amiodarone  IV while on milrinone .  Would try transitioning him to mexiletine when off milrinone  (for long-term PVC suppression).  6. OSA: Did not tolerate CPAP.  7. Pleural effusions: Noted on CXR.  May need thoracentesis, follow with diuresis.  - Will get CXR PA and lateral today to assess size of effusions (?thoracentesis).   Ezra Shuck 05/18/2024 10:04 AM  "

## 2024-05-18 NOTE — Interval H&P Note (Signed)
 History and Physical Interval Note:  05/18/2024 9:22 AM  Thomas Gardner  has presented today for surgery, with the diagnosis of hf.  The various methods of treatment have been discussed with the patient and family. After consideration of risks, benefits and other options for treatment, the patient has consented to  Procedures: RIGHT HEART CATH (N/A) as a surgical intervention.  The patient's history has been reviewed, patient examined, no change in status, stable for surgery.  I have reviewed the patient's chart and labs.  Questions were answered to the patient's satisfaction.     Lathon Adan [employer]

## 2024-05-19 ENCOUNTER — Other Ambulatory Visit (HOSPITAL_COMMUNITY): Payer: Self-pay

## 2024-05-19 ENCOUNTER — Telehealth (HOSPITAL_COMMUNITY): Payer: Self-pay

## 2024-05-19 ENCOUNTER — Encounter (HOSPITAL_COMMUNITY): Payer: Self-pay | Admitting: Cardiology

## 2024-05-19 DIAGNOSIS — I5023 Acute on chronic systolic (congestive) heart failure: Secondary | ICD-10-CM | POA: Diagnosis not present

## 2024-05-19 LAB — BASIC METABOLIC PANEL WITH GFR
Anion gap: 9 (ref 5–15)
BUN: 43 mg/dL — ABNORMAL HIGH (ref 8–23)
CO2: 27 mmol/L (ref 22–32)
Calcium: 8.5 mg/dL — ABNORMAL LOW (ref 8.9–10.3)
Chloride: 101 mmol/L (ref 98–111)
Creatinine, Ser: 2.58 mg/dL — ABNORMAL HIGH (ref 0.61–1.24)
GFR, Estimated: 26 mL/min — ABNORMAL LOW
Glucose, Bld: 189 mg/dL — ABNORMAL HIGH (ref 70–99)
Potassium: 3.6 mmol/L (ref 3.5–5.1)
Sodium: 138 mmol/L (ref 135–145)

## 2024-05-19 LAB — CBC
HCT: 30.8 % — ABNORMAL LOW (ref 39.0–52.0)
Hemoglobin: 9.9 g/dL — ABNORMAL LOW (ref 13.0–17.0)
MCH: 26 pg (ref 26.0–34.0)
MCHC: 32.1 g/dL (ref 30.0–36.0)
MCV: 80.8 fL (ref 80.0–100.0)
Platelets: 180 K/uL (ref 150–400)
RBC: 3.81 MIL/uL — ABNORMAL LOW (ref 4.22–5.81)
RDW: 14.8 % (ref 11.5–15.5)
WBC: 7.6 K/uL (ref 4.0–10.5)
nRBC: 0 % (ref 0.0–0.2)

## 2024-05-19 LAB — COOXEMETRY PANEL
Carboxyhemoglobin: 1.1 % (ref 0.5–1.5)
Methemoglobin: <0.7 % (ref 0.0–1.5)
O2 Saturation: 62.9 %
Total hemoglobin: 9.3 g/dL — ABNORMAL LOW (ref 12.0–16.0)

## 2024-05-19 LAB — GLUCOSE, CAPILLARY
Glucose-Capillary: 149 mg/dL — ABNORMAL HIGH (ref 70–99)
Glucose-Capillary: 211 mg/dL — ABNORMAL HIGH (ref 70–99)
Glucose-Capillary: 193 mg/dL — ABNORMAL HIGH (ref 70–99)
Glucose-Capillary: 243 mg/dL — ABNORMAL HIGH (ref 70–99)

## 2024-05-19 LAB — MAGNESIUM: Magnesium: 2.1 mg/dL (ref 1.7–2.4)

## 2024-05-19 NOTE — Care Management Important Message (Signed)
 Important Message  Patient Details  Name: RUMEAL CULLIPHER MRN: [id number] Date of Birth: Sep 22, 1955   Important Message Given:  Yes - Medicare IM     Vonzell Arrie Sharps 05/19/2024, 10:45 AM

## 2024-05-19 NOTE — Plan of Care (Signed)
  Problem: Education: Goal: Individualized Educational Video(s) Outcome: Progressing   

## 2024-05-19 NOTE — Telephone Encounter (Signed)
 Pharmacy Patient Advocate Encounter  Insurance verification completed.    The patient is insured through [employer]. Patient has Medicare and is not eligible for a copay card, but may be able to apply for patient assistance or Medicare RX Payment Plan (Patient Must reach out to their plan, if eligible for payment plan), if available.    Ran test claim for Bidil  20-37.5mg  tablet and the current 30 day co-pay is $0.   This test claim was processed through [employer]- copay amounts may vary at other pharmacies due to [employer], or as the patient moves through the different stages of their insurance plan.

## 2024-05-19 NOTE — Progress Notes (Signed)
 Physical Therapy Treatment Patient Details Name: Thomas Gardner MRN: [id number] DOB: [birth year] Today's Date: 05/19/2024   History of Present Illness Pt is a 68 y/o male admitted 05/13/24 with shortness of breath. Bil pleural effusions with ?pna; acute on chronic CHF (EF <20%); elevated troponin likely myocardial injury per Cardiology; PMH includes: arthritis, CHF, CAD, DM, HTN, morbid obesity, CKD, gout, prostate cancer, CVA.    PT Comments  Pt tolerated session well, ambulating with AD and no physical assistance. Pt declined stair negotiation at beginning of session reporting he feels too fatigued following multiple bouts of mobility today, but participated in stair training education. Pt was educated on navigating stairs laterally with one person guarding from behind for safety. Pt reports confidence in ability to negotiate and feels his friend and his friend's mom will be able to provide appropriate level of assistance. Pt would benefit from stair training. PT will continue to treat pt while he is admitted. Recommending HHPT at discharge to address remaining mobility deficits and optimize return to PLOF.     If plan is discharge home, recommend the following: Assistance with cooking/housework;Help with stairs or ramp for entrance   Can travel by private vehicle        Equipment Recommendations  Rolling walker (2 wheels)    Recommendations for Other Services       Precautions / Restrictions Precautions Precautions: Fall Recall of Precautions/Restrictions: Intact Restrictions Weight Bearing Restrictions Per Provider Order: No     Mobility  Bed Mobility Overal bed mobility: Modified Independent             General bed mobility comments: HOB elevated    Transfers Overall transfer level: Needs assistance Equipment used: Rolling walker (2 wheels) Transfers: Sit to/from Stand Sit to Stand: Supervision           General transfer comment: STS from EOB w/ RW and no  physical assistance. Increased time to complete.    Ambulation/Gait Ambulation/Gait assistance: Contact guard assist Gait Distance (Feet): 125 Feet Assistive device: Rolling walker (2 wheels) Gait Pattern/deviations: Step-through pattern, Decreased stride length, Antalgic, Decreased stance time - right Gait velocity: reduced Gait velocity interpretation: <1.8 ft/sec, indicate of risk for recurrent falls   General Gait Details: Pt ambulates with increased reliance on RW, and demonstrates reciprocal gait pattern with reduced knee flexion on the R during swing phase, in addition to being in ER throughout.   Stairs             Wheelchair Mobility     Tilt Bed    Modified Rankin (Stroke Patients Only)       Balance Overall balance assessment: Needs assistance Sitting-balance support: No upper extremity supported, Feet supported Sitting balance-Leahy Scale: Good Sitting balance - Comments: seated EOB   Standing balance support: Bilateral upper extremity supported, During functional activity, Reliant on assistive device for balance Standing balance-Leahy Scale: Poor Standing balance comment: reliant on external support                            Communication Communication Communication: No apparent difficulties  Cognition Arousal: Alert Behavior During Therapy: WFL for tasks assessed/performed   PT - Cognitive impairments: No apparent impairments                         Following commands: Intact      Cueing Cueing Techniques: Verbal cues, Visual cues  Exercises  General Comments General comments (skin integrity, edema, etc.): VSS      Pertinent Vitals/Pain Pain Assessment Pain Assessment: No/denies pain    Home Living                          Prior Function            PT Goals (current goals can now be found in the care plan section) Acute Rehab PT Goals Patient Stated Goal: improve his shortness of breath  before going home PT Goal Formulation: With patient Time For Goal Achievement: 05/28/24 Potential to Achieve Goals: Good Progress towards PT goals: Progressing toward goals    Frequency    Min 2X/week      PT Plan      Co-evaluation              AM-PAC PT 6 Clicks Mobility   Outcome Measure  Help needed turning from your back to your side while in a flat bed without using bedrails?: None Help needed moving from lying on your back to sitting on the side of a flat bed without using bedrails?: None Help needed moving to and from a bed to a chair (including a wheelchair)?: A Little Help needed standing up from a chair using your arms (e.g., wheelchair or bedside chair)?: A Little Help needed to walk in hospital room?: A Little Help needed climbing 3-5 steps with a railing? : A Lot 6 Click Score: 19    End of Session Equipment Utilized During Treatment: Gait belt Activity Tolerance: Patient tolerated treatment well Patient left: in bed;with call bell/phone within reach;with bed alarm set Nurse Communication: Mobility status PT Visit Diagnosis: Unsteadiness on feet (R26.81);History of falling (Z91.81)     Time: 8655-8594 PT Time Calculation (min) (ACUTE ONLY): 21 min  Charges:    $Therapeutic Activity: 8-22 mins PT General Charges $$ ACUTE PT VISIT: 1 Visit                     [name] DPT Acute Rehab Services [phone] Prefer contact via chat    Leontine [name] [name] 05/19/2024, 2:42 PM

## 2024-05-19 NOTE — Progress Notes (Signed)
 " PROGRESS NOTE    Thomas Gardner  FMW:[id number] DOB: 22-Sep-1955 DOA: 05/13/2024 PCP: Maree Leni Edyth DELENA, MD  68/M with history of CAD, ischemic cardiomyopathy, systolic CHF, two-vessel CAD, hypertension, diabetes, CKD 3A, obesity, untreated OSA, CVA admitted with CHF - Echo this admission with worsening cardiomyopathy, EF less than 20%, concern for low output on admission, started on milrinone , - Improving with diuresis, CHF team following   Subjective: -Feels better overall today, had a right heart cath yesterday  Assessment and Plan:  Acute on chronic systolic CHF: - Known systolic CHF, EF around 35-40% - Echo this admission with EF less than 20%, normal RV - Admitted with fluid overload, cough slow initially, started on milrinone  12/29 - Also complicated by AKI/CKD 3 - RHC 12/30 noted normal filling pressures and normal cardiac index on milrinone  - On low-dose milrinone , likely DC today, appears euvolemic - Per CHF team, continue BiDil , Jardiance  - GDMT limited by AKI/CKD  CAD - Known occluded RCA with collaterals, 70% LAD stenosis and 80% distal LAD - On medical management at this time - Continue aspirin  and statin  AKI on CKD 3A - Likely cardiorenal syndrome, now off Lasix , - Creatinine stabilizing, no improvement yet  H/o CVA in 10/22 with mod-severe right PCA stenosis on CTA head/neck.  - Continue aspirin  and statin   PVCs: H/o frequent PVCs - On amiodarone  IV while on milrinone .  OSA: Did not tolerate CPAP.   Pleural effusions Improved on repeat x-ray   DVT prophylaxis:lovenox  Code Status: full  Family Communication:none Disposition Plan: Home likely 48h    Objective: Vitals:   05/18/24 2000 05/18/24 2330 05/19/24 0100 05/19/24 0305  BP: 118/61 (!) 112/51  (!) 115/57  Pulse: 89   98  Resp: 17   18  Temp: 97.8 F (36.6 C)   98.1 F (36.7 C)  TempSrc: Oral   Oral  SpO2: 98%  95% 96%  Weight:    103.4 kg  Height:    5' 6 (1.676 m)     Intake/Output Summary (Last 24 hours) at 05/19/2024 0743 Last data filed at 05/19/2024 0300 Gross per 24 hour  Intake 889.47 ml  Output 2850 ml  Net -1960.53 ml   Filed Weights   05/17/24 0300 05/18/24 0300 05/19/24 0305  Weight: 105.6 kg 104.9 kg 103.4 kg    Examination:  General exam: Appears calm and comfortable  HEENT: No JVD Respiratory system: Clear to auscultation Cardiovascular system: S1 & S2 heard, RRR.  Abd: nondistended, soft and nontender.Normal bowel sounds heard. Central nervous system: Alert and oriented. No focal neurological deficits. Extremities: 1 plus edema, Unna boots Skin: No rashes Psychiatry:  Mood & affect appropriate.     Data Reviewed:   CBC: Recent Labs  Lab 05/13/24 0600 05/14/24 0328 05/15/24 0320 05/16/24 0259 05/17/24 0253 05/18/24 0659 05/18/24 0944 05/19/24 0459  WBC 11.0*   < > 8.5 6.1 7.1 6.6  --  7.6  NEUTROABS 9.0*  --   --   --   --   --   --   --   HGB 12.5*   < > 11.7* 11.3* 11.7* 10.0* 10.5*  10.5* 9.9*  HCT 40.0   < > 36.4* 35.5* 37.3* 31.5* 31.0*  31.0* 30.8*  MCV 84.0   < > 81.6 82.0 82.3 83.1  --  80.8  PLT 206   < > 199 180 203 199  --  180   < > = values in this interval not displayed.  Basic Metabolic Panel: Recent Labs  Lab 05/13/24 0600 05/14/24 0738 05/15/24 0320 05/16/24 0259 05/17/24 0253 05/18/24 0659 05/18/24 0944 05/19/24 0459  NA 142 139 140 138 140 140 140  140 138  K 3.8 3.5 3.7 3.2* 4.1 3.5 3.3*  3.3* 3.6  CL 107 105 104 104 105 103  --  101  CO2 23 23 28 25 25 28   --  27  GLUCOSE 110* 98 58* 133* 104* 146*  --  189*  BUN 27* 29* 29* 29* 36* 47*  --  43*  CREATININE 2.04* 1.81* 1.91* 2.09* 2.32* 2.49*  --  2.58*  CALCIUM  8.4* 8.2* 8.2* 8.0* 8.4* 8.4*  --  8.5*  MG 2.1 2.1  --  2.0  --  2.1  --  2.1   GFR: Estimated Creatinine Clearance: 30.9 mL/min (A) (by C-G formula based on SCr of 2.58 mg/dL (H)). Liver Function Tests: Recent Labs  Lab 05/13/24 0600  AST 93*  ALT 23   ALKPHOS 104  BILITOT 0.4  PROT 6.4*  ALBUMIN 3.2*   No results for input(s): LIPASE, AMYLASE in the last 168 hours. No results for input(s): AMMONIA in the last 168 hours. Coagulation Profile: No results for input(s): INR, PROTIME in the last 168 hours. Cardiac Enzymes: No results for input(s): CKTOTAL, CKMB, CKMBINDEX, TROPONINI in the last 168 hours. BNP (last 3 results) Recent Labs    05/13/24 0051  PROBNP 8,752.0*   HbA1C: No results for input(s): HGBA1C in the last 72 hours. CBG: Recent Labs  Lab 05/18/24 0544 05/18/24 1214 05/18/24 1641 05/18/24 2042 05/19/24 0611  GLUCAP 155* 141* 143* 225* 149*   Lipid Profile: No results for input(s): CHOL, HDL, LDLCALC, TRIG, CHOLHDL, LDLDIRECT in the last 72 hours. Thyroid Function Tests: No results for input(s): TSH, T4TOTAL, FREET4, T3FREE, THYROIDAB in the last 72 hours. Anemia Panel: No results for input(s): VITAMINB12, FOLATE, FERRITIN, TIBC, IRON , RETICCTPCT in the last 72 hours. Urine analysis:    Component Value Date/Time   COLORURINE YELLOW 03/01/2021 1642   APPEARANCEUR CLEAR 03/01/2021 1642   LABSPEC 1.025 03/01/2021 1642   PHURINE 5.0 03/01/2021 1642   GLUCOSEU >=500 (A) 03/01/2021 1642   HGBUR NEGATIVE 03/01/2021 1642   BILIRUBINUR NEGATIVE 03/01/2021 1642   KETONESUR NEGATIVE 03/01/2021 1642   PROTEINUR 100 (A) 03/01/2021 1642   UROBILINOGEN 0.2 08/24/2013 1328   NITRITE NEGATIVE 03/01/2021 1642   LEUKOCYTESUR NEGATIVE 03/01/2021 1642   Sepsis Labs: @LABRCNTIP (procalcitonin:4,lacticidven:4)  ) Recent Results (from the past 240 hours)  Resp panel by RT-PCR (RSV, Flu A&B, Covid) Anterior Nasal Swab     Status: None   Collection Time: 05/13/24  3:33 AM   Specimen: Anterior Nasal Swab  Result Value Ref Range Status   SARS Coronavirus 2 by RT PCR NEGATIVE NEGATIVE Final   Influenza A by PCR NEGATIVE NEGATIVE Final   Influenza B by PCR NEGATIVE  NEGATIVE Final    Comment: (NOTE) The Xpert Xpress SARS-CoV-2/FLU/RSV plus assay is intended as an aid in the diagnosis of influenza from Nasopharyngeal swab specimens and should not be used as a sole basis for treatment. Nasal washings and aspirates are unacceptable for Xpert Xpress SARS-CoV-2/FLU/RSV testing.  Fact Sheet for Patients: bloggercourse.com  Fact Sheet for Healthcare Providers: seriousbroker.it  This test is not yet approved or cleared by the [place]  FDA and has been authorized for detection and/or diagnosis of SARS-CoV-2 by FDA under an Emergency Use Authorization (EUA). This EUA will remain in effect (meaning this test  can be used) for the duration of the COVID-19 declaration under Section 564(b)(1) of the Act, 21 U.S.C. section 360bbb-3(b)(1), unless the authorization is terminated or revoked.     Resp Syncytial Virus by PCR NEGATIVE NEGATIVE Final    Comment: (NOTE) Fact Sheet for Patients: bloggercourse.com  Fact Sheet for Healthcare Providers: seriousbroker.it  This test is not yet approved or cleared by the [place]  FDA and has been authorized for detection and/or diagnosis of SARS-CoV-2 by FDA under an Emergency Use Authorization (EUA). This EUA will remain in effect (meaning this test can be used) for the duration of the COVID-19 declaration under Section 564(b)(1) of the Act, 21 U.S.C. section 360bbb-3(b)(1), unless the authorization is terminated or revoked.  Performed at [facility], [address] [place], [place] [address]      Radiology Studies: DG Chest 2 View Result Date: 05/18/2024 CLINICAL DATA:  Pleural effusion.  Productive cough. EXAM: CHEST - 2 VIEW COMPARISON:  05/13/2024 FINDINGS: Improved lung aeration with decreasing lung base opacities and pleural effusion. Trace residual blunting of the right costophrenic angle.  Mild patchy opacity at the periphery of the right lung base. Right upper extremity PICC tip in the upper SVC. The heart is mildly enlarged. No pneumothorax. IMPRESSION: 1. Improved lung aeration with decreasing lung base opacities and pleural effusions. Trace residual blunting of the right costophrenic angle. 2. Mild patchy opacity at the periphery of the right lung base, may be atelectasis or pneumonia. Electronically Signed   By: Andrea Gasman M.D.   On: 05/18/2024 16:41   CARDIAC CATHETERIZATION Result Date: 05/18/2024 1. Normal filling pressure and PA pressure. 2. Normal cardiac output on milrinone .   VAS US  LOWER EXTREMITY VENOUS (DVT) Result Date: 05/18/2024  Lower Venous DVT Study Patient Name:  Thomas Gardner  Date of Exam:   05/17/2024 Medical Rec #: [id number]        Accession #:    [id number] Date of Birth: 1955-09-18        Patient Gender: M Patient Age:   53 years Exam Location:  [facility] Procedure:      VAS US  LOWER EXTREMITY VENOUS (DVT) Referring Phys: NORVAL BAR --------------------------------------------------------------------------------  Indications: Edema. Other Indications: Acute on chronic CHF. Limitations: Poor ultrasound/tissue interface. Comparison Study: No previous exams Performing Technologist: Jody Hill RVT, RDMS  Examination Guidelines: A complete evaluation includes B-mode imaging, spectral Doppler, color Doppler, and power Doppler as needed of all accessible portions of each vessel. Bilateral testing is considered an integral part of a complete examination. Limited examinations for reoccurring indications may be performed as noted. The reflux portion of the exam is performed with the patient in reverse Trendelenburg.  +---------+---------------+---------+-----------+----------+--------------+ RIGHT    CompressibilityPhasicitySpontaneityPropertiesThrombus Aging +---------+---------------+---------+-----------+----------+--------------+ CFV      Full            Yes      Yes                                 +---------+---------------+---------+-----------+----------+--------------+ SFJ      Full                                                        +---------+---------------+---------+-----------+----------+--------------+ FV Prox  Full  Yes      Yes                                 +---------+---------------+---------+-----------+----------+--------------+ FV Mid   Full           Yes      Yes                                 +---------+---------------+---------+-----------+----------+--------------+ FV DistalFull           Yes      Yes                                 +---------+---------------+---------+-----------+----------+--------------+ PFV      Full                                                        +---------+---------------+---------+-----------+----------+--------------+ POP      Full           Yes      Yes                                 +---------+---------------+---------+-----------+----------+--------------+ PTV      Full                                                        +---------+---------------+---------+-----------+----------+--------------+ PERO     Full                                                        +---------+---------------+---------+-----------+----------+--------------+   +---------+---------------+---------+-----------+----------+-------------------+ LEFT     CompressibilityPhasicitySpontaneityPropertiesThrombus Aging      +---------+---------------+---------+-----------+----------+-------------------+ CFV      Full           Yes      Yes                                      +---------+---------------+---------+-----------+----------+-------------------+ SFJ      Full                                                             +---------+---------------+---------+-----------+----------+-------------------+ FV Prox  Full           Yes       Yes                                      +---------+---------------+---------+-----------+----------+-------------------+ FV Mid  Full           Yes      Yes                                      +---------+---------------+---------+-----------+----------+-------------------+ FV DistalFull           Yes      Yes                                      +---------+---------------+---------+-----------+----------+-------------------+ PFV      Full                                                             +---------+---------------+---------+-----------+----------+-------------------+ POP      Full           Yes      Yes                                      +---------+---------------+---------+-----------+----------+-------------------+ PTV                                                   Not well visualized +---------+---------------+---------+-----------+----------+-------------------+ PERO     Full                                                             +---------+---------------+---------+-----------+----------+-------------------+     Summary: BILATERAL: - No evidence of deep vein thrombosis seen in the lower extremities, bilaterally. -No evidence of popliteal cyst, bilaterally.   *See table(s) above for measurements and observations. Electronically signed by Fonda Rim on 05/18/2024 at 9:24:21 AM.    Final    US  EKG SITE RITE Result Date: 05/17/2024 If Site Rite image not attached, placement could not be confirmed due to current cardiac rhythm.    Scheduled Meds:  allopurinol   100 mg Oral Daily   aspirin  EC  81 mg Oral Daily   atorvastatin   80 mg Oral Daily   Chlorhexidine  Gluconate Cloth  6 each Topical Daily   empagliflozin   10 mg Oral Daily   enoxaparin  (LOVENOX ) injection  40 mg Subcutaneous Daily   ferrous sulfate  325 mg Oral Q breakfast   insulin  aspart  0-9 Units Subcutaneous TID WC   isosorbide -hydrALAZINE   1 tablet Oral TID    sodium chloride  flush  10-40 mL Intracatheter Q12H   tamsulosin   0.4 mg Oral Daily   Continuous Infusions:  amiodarone  30 mg/hr (05/18/24 2136)   milrinone  0.125 mcg/kg/min (05/18/24 1819)     LOS: 6 days    Time spent:    Sigurd Pac, MD [facility]   05/19/2024, 7:43 AM    "

## 2024-05-19 NOTE — Progress Notes (Signed)
 Mobility Specialist Progress Note:    05/19/24 0951  Mobility  Activity Ambulated with assistance  Level of Assistance Standby assist, set-up cues, supervision of patient - no hands on  Assistive Device Other (Comment) (IV Pole)  Distance Ambulated (ft) 25 ft  Range of Motion/Exercises Active  Activity Response Tolerated fair  Mobility Referral Yes  Mobility visit 1 Mobility  Mobility Specialist Start Time (ACUTE ONLY) [id number]  Mobility Specialist Stop Time (ACUTE ONLY) 1000  Mobility Specialist Time Calculation (min) (ACUTE ONLY) 9 min   Received pt ambulating in room agreeable to session. No c/o any symptoms but seemed somewhat SOB. Pt moving and ambulating slowly but decently. Pt may benefit from using RW for more stability. Returned pt to room w/ all needs met.   Venetia Keel Mobility Specialist Please Neurosurgeon or Rehab Office at [phone]

## 2024-05-19 NOTE — Progress Notes (Signed)
 Patient ID: Thomas Gardner, male   DOB: June 15, 1955, 68 y.o.   MRN: [id number]     Advanced Heart Failure Rounding Note  Cardiologist: Annabella Scarce, MD  AHF Cardiologist: Dr. Rolan Chief Complaint: A/C HFrEf Patient Profile   Thomas Gardner is a 68 y.o. male with iCM, chronic HFrEF, unvascularized 2v CAD (pRCA CTA and 70% pLAD/mLAD, 80% dLAD), HTN, HLD, DMII, CKD stage 3, morbid obesity, PVCs, asthma, prostate cancer s/p radiation, OSA, CVA (10/22') and carotid stenosis.   Significant events:   05/17/24: AHF team consulted for concerns of low output HF. PICC placed. Milrinone  started. Diuretics increased.   Subjective:    I/Os net negative 1869 cc.  Weight down 4 lbs.  Last dose of Lasix  yesterday morning.   Co-ox 63% on milrinone  0.125  SCr 2.32>2.49>2.58 but BUN now trending down.   No dyspnea today.    Objective:   Weight Range: 103.4 kg Body mass index is 36.79 kg/m.   Vital Signs:   Temp:  [97.7 F (36.5 C)-98.1 F (36.7 C)] 97.7 F (36.5 C) (12/31 0826) Pulse Rate:  [80-98] 94 (12/31 0826) Resp:  [15-20] 17 (12/31 0826) BP: (112-153)/(50-111) 153/104 (12/31 0826) SpO2:  [95 %-98 %] 96 % (12/31 0305) Weight:  [103.4 kg] 103.4 kg (12/31 0305) Last BM Date : 05/17/24  Weight change: Filed Weights   05/17/24 0300 05/18/24 0300 05/19/24 0305  Weight: 105.6 kg 104.9 kg 103.4 kg    Intake/Output:   Intake/Output Summary (Last 24 hours) at 05/19/2024 0828 Last data filed at 05/19/2024 0300 Gross per 24 hour  Intake 889.47 ml  Output 2650 ml  Net -1760.53 ml     Physical Exam   General: NAD Neck: No JVD, no thyromegaly or thyroid nodule.  Lungs: Clear to auscultation bilaterally with normal respiratory effort. CV: Nondisplaced PMI.  Heart regular S1/S2, no S3/S4, no murmur.  Trace ankle edema.  Abdomen: Soft, nontender, no hepatosplenomegaly, no distention.  Skin: Intact without lesions or rashes.  Neurologic: Alert and oriented x 3.  Psych:  Normal affect. Extremities: No clubbing or cyanosis.  HEENT: Normal.   Telemetry   NSR with PVCs (Personally reviewed)    Labs   CBC Recent Labs    05/18/24 0659 05/18/24 0944 05/19/24 0459  WBC 6.6  --  7.6  HGB 10.0* 10.5*  10.5* 9.9*  HCT 31.5* 31.0*  31.0* 30.8*  MCV 83.1  --  80.8  PLT 199  --  180   Basic Metabolic Panel Recent Labs    87/69/74 0659 05/18/24 0944 05/19/24 0459  NA 140 140  140 138  K 3.5 3.3*  3.3* 3.6  CL 103  --  101  CO2 28  --  27  GLUCOSE 146*  --  189*  BUN 47*  --  43*  CREATININE 2.49*  --  2.58*  CALCIUM  8.4*  --  8.5*  MG 2.1  --  2.1   Liver Function Tests No results for input(s): AST, ALT, ALKPHOS, BILITOT, PROT, ALBUMIN in the last 72 hours. No results for input(s): LIPASE, AMYLASE in the last 72 hours. Cardiac Enzymes No results for input(s): CKTOTAL, CKMB, CKMBINDEX, TROPONINI in the last 72 hours.  BNP: BNP (last 3 results) Recent Labs    01/13/24 1208 01/30/24 1340  BNP 170.3* 119.0*    ProBNP (last 3 results) Recent Labs    05/13/24 0051  PROBNP 8,752.0*     D-Dimer No results for input(s): DDIMER in the last 72  hours. Hemoglobin A1C No results for input(s): HGBA1C in the last 72 hours. Fasting Lipid Panel No results for input(s): CHOL, HDL, LDLCALC, TRIG, CHOLHDL, LDLDIRECT in the last 72 hours. Medications:   Scheduled Medications:  allopurinol   100 mg Oral Daily   aspirin  EC  81 mg Oral Daily   atorvastatin   80 mg Oral Daily   Chlorhexidine  Gluconate Cloth  6 each Topical Daily   empagliflozin   10 mg Oral Daily   enoxaparin  (LOVENOX ) injection  40 mg Subcutaneous Daily   ferrous sulfate  325 mg Oral Q breakfast   insulin  aspart  0-9 Units Subcutaneous TID WC   isosorbide -hydrALAZINE   1 tablet Oral TID   sodium chloride  flush  10-40 mL Intracatheter Q12H   tamsulosin   0.4 mg Oral Daily    Infusions:  amiodarone  30 mg/hr (05/18/24 2136)   milrinone   0.125 mcg/kg/min (05/18/24 1819)    PRN Medications: acetaminophen  **OR** acetaminophen , albuterol , guaiFENesin -dextromethorphan , lip balm, loratadine , senna, sodium chloride  flush  Assessment/Plan   1. 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 History of frequent PVCs (9.6% on Zio in 8/24) as well as HTN.  Echo back in 7/22 with EF 35-40%.  Echoes in 10/22 and 8/24 with EF 50%, echo in 8/25 with EF 35-40% led to cath showing occluded RCA with collaterals and long 70% proximal LAD stenosis (hemodynamically significant by FFR) as well as 80% distal LAD stenosis.  With this admission, echo showed EF <20% with moderate LV dilation, RV normal, IVC dilated.  EF is decreased out of proportion to cath.  Based on Revived-BCIS data, decision made not to intervene on the moderate (but hemodynamically significant) LAD stenosis given lack of ACS/chest pain.  Milrinone  begun 12/29 (co-ox 46%).  He has diuresed well but creatinine also up to 2.58. RHC today on milrinone  0.125 yesterday showed good CO and normal filling pressures.  He has residual peripheral edema that is likely due to venous insufficiency. Co-ox 63% today, he does not look volume overloaded.   - No diuretic today, probably start po tomorrow.  - Continue milrinone  today to allow creatinine to stabilize, hopefully stop tomorrow.  Creatinine mildly higher this morning but BUN now trending down.  - Continue Unna boots.  - Continue Bidil  1 tab tid, will try to increase tomorrow when milrinone  stopped.  - Continue Jardiance  10 daily.  - GDMT will be limited by AKI on CKD stage 3.  2. CAD: Cath 10/25 showed occluded RCA with collaterals and long 70% proximal LAD stenosis (hemodynamically significant by FFR) as well as 80% distal LAD stenosis.  Patient has not had chest pain, presentation not consistent with ACS.  Seen by Dr. Wendel initially, and based on Revived-BCIS data, decision made not to  intervene on the moderate (but hemodynamically significant) LAD stenosis given lack of ACS/chest pain. I think this is reasonable as I am not sure he would derive much benefit in absence of ACS.  - Continue ASA 81 - Continue atorvastatin  (on Repatha  at home).  3. AKI on CKD stage 3: Baseline creatinine around 1.6, up to 2.58 today.  Suspect cardiorenal syndrome. Normal filling pressures on RHC today.  - No diuretic today - Continue milrinone  today to allow creatinine to stabilize, hopefully off tomorrow as BUN starting to come down.  4. H/o CVA in 10/22 with mod-severe right PCA stenosis on CTA head/neck.  - Continue ASA 81 - Continue atorvastatin  80, on Repatha  at home.  5. PVCs: H/o frequent PVCs, ?contribute  to cardiomyopathy.   - On amiodarone  IV while on milrinone .  Would try transitioning him to mexiletine when off milrinone  (for long-term PVC suppression).  6. OSA: Did not tolerate CPAP.  7. Pleural effusions: Noted on initial CXR.  Yesterday's CXR was improved.    Length of Stay: 6  Ezra Shuck, MD  05/19/2024, 8:28 AM  Advanced Heart Failure Team Pager [phone] (M-F; 7a - 5p)   Please visit [email or web address]: For overnight coverage please call cardiology fellow first. If fellow not available call Shock/ECMO MD on call.  For ECMO / Mechanical Support (Impella, IABP, LVAD) issues call Shock / ECMO MD on call.

## 2024-05-20 DIAGNOSIS — I5023 Acute on chronic systolic (congestive) heart failure: Secondary | ICD-10-CM | POA: Diagnosis not present

## 2024-05-20 LAB — COOXEMETRY PANEL
Carboxyhemoglobin: 1.6 % — ABNORMAL HIGH (ref 0.5–1.5)
Methemoglobin: <0.7 % (ref 0.0–1.5)
O2 Saturation: 74.8 %
Total hemoglobin: 10.3 g/dL — ABNORMAL LOW (ref 12.0–16.0)

## 2024-05-20 LAB — BASIC METABOLIC PANEL WITH GFR
Anion gap: 11 (ref 5–15)
BUN: 38 mg/dL — ABNORMAL HIGH (ref 8–23)
CO2: 26 mmol/L (ref 22–32)
Calcium: 8.9 mg/dL (ref 8.9–10.3)
Chloride: 102 mmol/L (ref 98–111)
Creatinine, Ser: 2.35 mg/dL — ABNORMAL HIGH (ref 0.61–1.24)
GFR, Estimated: 29 mL/min — ABNORMAL LOW
Glucose, Bld: 170 mg/dL — ABNORMAL HIGH (ref 70–99)
Potassium: 3.6 mmol/L (ref 3.5–5.1)
Sodium: 139 mmol/L (ref 135–145)

## 2024-05-20 LAB — GLUCOSE, CAPILLARY
Glucose-Capillary: 127 mg/dL — ABNORMAL HIGH (ref 70–99)
Glucose-Capillary: 161 mg/dL — ABNORMAL HIGH (ref 70–99)
Glucose-Capillary: 163 mg/dL — ABNORMAL HIGH (ref 70–99)
Glucose-Capillary: 179 mg/dL — ABNORMAL HIGH (ref 70–99)
Glucose-Capillary: 224 mg/dL — ABNORMAL HIGH (ref 70–99)
Glucose-Capillary: 232 mg/dL — ABNORMAL HIGH (ref 70–99)

## 2024-05-20 LAB — CBC
HCT: 31.6 % — ABNORMAL LOW (ref 39.0–52.0)
Hemoglobin: 10 g/dL — ABNORMAL LOW (ref 13.0–17.0)
MCH: 26.3 pg (ref 26.0–34.0)
MCHC: 31.6 g/dL (ref 30.0–36.0)
MCV: 83.2 fL (ref 80.0–100.0)
Platelets: 238 K/uL (ref 150–400)
RBC: 3.8 MIL/uL — ABNORMAL LOW (ref 4.22–5.81)
RDW: 15.1 % (ref 11.5–15.5)
WBC: 9.1 K/uL (ref 4.0–10.5)
nRBC: 0 % (ref 0.0–0.2)

## 2024-05-20 LAB — MAGNESIUM: Magnesium: 2.2 mg/dL (ref 1.7–2.4)

## 2024-05-20 MED ORDER — POTASSIUM CHLORIDE CRYS ER 20 MEQ PO TBCR
40.0000 meq | EXTENDED_RELEASE_TABLET | Freq: Once | ORAL | Status: AC
  Administered 2024-05-20: 40 meq via ORAL
  Filled 2024-05-20: qty 2

## 2024-05-20 MED ORDER — ISOSORB DINITRATE-HYDRALAZINE 20-37.5 MG PO TABS
2.0000 | ORAL_TABLET | Freq: Three times a day (TID) | ORAL | Status: DC
  Administered 2024-05-20 – 2024-05-21 (×4): 2 via ORAL
  Filled 2024-05-20 (×4): qty 2

## 2024-05-20 MED ORDER — BISACODYL 5 MG PO TBEC
10.0000 mg | DELAYED_RELEASE_TABLET | Freq: Once | ORAL | Status: AC
  Administered 2024-05-20: 10 mg via ORAL
  Filled 2024-05-20: qty 2

## 2024-05-20 MED ORDER — POLYETHYLENE GLYCOL 3350 17 G PO PACK: 17.0000 g | PACK | Freq: Every day | ORAL | Status: DC | PRN

## 2024-05-20 MED ORDER — TORSEMIDE 20 MG PO TABS
20.0000 mg | ORAL_TABLET | Freq: Every day | ORAL | Status: DC
  Administered 2024-05-20 – 2024-05-21 (×2): 20 mg via ORAL
  Filled 2024-05-20 (×2): qty 1

## 2024-05-20 MED ORDER — MEXILETINE HCL 150 MG PO CAPS
150.0000 mg | ORAL_CAPSULE | Freq: Two times a day (BID) | ORAL | Status: DC
  Administered 2024-05-20 – 2024-05-21 (×3): 150 mg via ORAL
  Filled 2024-05-20 (×3): qty 1

## 2024-05-20 MED ORDER — POLYETHYLENE GLYCOL 3350 17 G PO PACK
17.0000 g | PACK | Freq: Once | ORAL | Status: AC
  Administered 2024-05-20: 17 g via ORAL
  Filled 2024-05-20: qty 1

## 2024-05-20 NOTE — Progress Notes (Signed)
 " PROGRESS NOTE    Thomas Gardner  FMW:[id number] DOB: [birth year] DOA: 05/13/2024 PCP: Maree Leni Edyth DELENA, MD  68/M with history of CAD, ischemic cardiomyopathy, systolic CHF, two-vessel CAD, hypertension, diabetes, CKD 3A, obesity, untreated OSA, CVA admitted with CHF - Echo this admission with worsening cardiomyopathy, EF less than 20%, concern for low output on admission, started on milrinone , - Improving with diuresis, CHF team following   Subjective: -feels ok, some dyspnea overnight, no BM  Assessment and Plan:  Acute on chronic systolic CHF: - Known systolic CHF, EF around 35-40% - Echo this admission with EF less than 20%, normal RV - Admitted with fluid overload, cough slow initially, started on milrinone  12/29 - Also complicated by AKI/CKD 3 - RHC 12/30 noted normal filling pressures and normal cardiac index on milrinone  - On low-dose milrinone , stopped today, appears euvolemic - Per CHF team, continue BiDil , Jardiance  - GDMT limited by AKI/CKD -home tomorrow if stable  CAD - Known occluded RCA with collaterals, 70% LAD stenosis and 80% distal LAD - On medical management at this time - Continue aspirin  and statin  AKI on CKD 3A - Likely cardiorenal syndrome, now off Lasix , - Creatinine stabilizing, no improvement yet  H/o CVA in 10/22 with mod-severe right PCA stenosis on CTA head/neck.  - Continue aspirin  and statin   PVCs: H/o frequent PVCs - On amiodarone  IV while on milrinone .  OSA: Did not tolerate CPAP.   Pleural effusions Improved on repeat x-ray  DM 2 -stable, aic is 6.8   DVT prophylaxis:lovenox  Code Status: full  Family Communication:none Disposition Plan: Home likely tomorrow    Objective: Vitals:   05/19/24 1925 05/19/24 2100 05/19/24 2350 05/20/24 0356  BP: (!) 134/96 (!) 160/72 128/76 116/61  Pulse: (!) 104  92   Resp: 18  17 18   Temp: 98.5 F (36.9 C)  97.7 F (36.5 C) 98.5 F (36.9 C)  TempSrc: Oral  Oral Oral  SpO2: 96%   94%   Weight:    103.5 kg  Height:        Intake/Output Summary (Last 24 hours) at 05/20/2024 0746 Last data filed at 05/19/2024 2311 Gross per 24 hour  Intake 600 ml  Output 1200 ml  Net -600 ml   Filed Weights   05/18/24 0300 05/19/24 0305 05/20/24 0356  Weight: 104.9 kg 103.4 kg 103.5 kg    Examination: Gen: Awake, Alert, Oriented X 3,  HEENT: no JVD Lungs: Good air movement bilaterally, CTAB CVS: S1S2/RRR Abd: soft, Non tender, non distended, BS present Extremities: Trace edema, Unna boots Skin: no new rashes on exposed skin     Data Reviewed:   CBC: Recent Labs  Lab 05/16/24 0259 05/17/24 0253 05/18/24 0659 05/18/24 0944 05/19/24 0459 05/20/24 0434  WBC 6.1 7.1 6.6  --  7.6 9.1  HGB 11.3* 11.7* 10.0* 10.5*  10.5* 9.9* 10.0*  HCT 35.5* 37.3* 31.5* 31.0*  31.0* 30.8* 31.6*  MCV 82.0 82.3 83.1  --  80.8 83.2  PLT 180 203 199  --  180 238   Basic Metabolic Panel: Recent Labs  Lab 05/14/24 0738 05/15/24 0320 05/16/24 0259 05/17/24 0253 05/18/24 0659 05/18/24 0944 05/19/24 0459 05/20/24 0434  NA 139   < > 138 140 140 140  140 138 139  K 3.5   < > 3.2* 4.1 3.5 3.3*  3.3* 3.6 3.6  CL 105   < > 104 105 103  --  101 102  CO2 23   < >  25 25 28   --  27 26  GLUCOSE 98   < > 133* 104* 146*  --  189* 170*  BUN 29*   < > 29* 36* 47*  --  43* 38*  CREATININE 1.81*   < > 2.09* 2.32* 2.49*  --  2.58* 2.35*  CALCIUM  8.2*   < > 8.0* 8.4* 8.4*  --  8.5* 8.9  MG 2.1  --  2.0  --  2.1  --  2.1 2.2   < > = values in this interval not displayed.   GFR: Estimated Creatinine Clearance: 33.9 mL/min (A) (by C-G formula based on SCr of 2.35 mg/dL (H)). Liver Function Tests: No results for input(s): AST, ALT, ALKPHOS, BILITOT, PROT, ALBUMIN in the last 168 hours.  No results for input(s): LIPASE, AMYLASE in the last 168 hours. No results for input(s): AMMONIA in the last 168 hours. Coagulation Profile: No results for input(s): INR, PROTIME in  the last 168 hours. Cardiac Enzymes: No results for input(s): CKTOTAL, CKMB, CKMBINDEX, TROPONINI in the last 168 hours. BNP (last 3 results) Recent Labs    05/13/24 0051  PROBNP 8,752.0*   HbA1C: No results for input(s): HGBA1C in the last 72 hours. CBG: Recent Labs  Lab 05/19/24 1111 05/19/24 1639 05/19/24 2042 05/19/24 2352 05/20/24 0559  GLUCAP 211* 193* 243* 161* 163*   Lipid Profile: No results for input(s): CHOL, HDL, LDLCALC, TRIG, CHOLHDL, LDLDIRECT in the last 72 hours. Thyroid Function Tests: No results for input(s): TSH, T4TOTAL, FREET4, T3FREE, THYROIDAB in the last 72 hours. Anemia Panel: No results for input(s): VITAMINB12, FOLATE, FERRITIN, TIBC, IRON , RETICCTPCT in the last 72 hours. Urine analysis:    Component Value Date/Time   COLORURINE YELLOW 03/01/2021 1642   APPEARANCEUR CLEAR 03/01/2021 1642   LABSPEC 1.025 03/01/2021 1642   PHURINE 5.0 03/01/2021 1642   GLUCOSEU >=500 (A) 03/01/2021 1642   HGBUR NEGATIVE 03/01/2021 1642   BILIRUBINUR NEGATIVE 03/01/2021 1642   KETONESUR NEGATIVE 03/01/2021 1642   PROTEINUR 100 (A) 03/01/2021 1642   UROBILINOGEN 0.2 08/24/2013 1328   NITRITE NEGATIVE 03/01/2021 1642   LEUKOCYTESUR NEGATIVE 03/01/2021 1642   Sepsis Labs: @LABRCNTIP (procalcitonin:4,lacticidven:4)  ) Recent Results (from the past 240 hours)  Resp panel by RT-PCR (RSV, Flu A&B, Covid) Anterior Nasal Swab     Status: None   Collection Time: 05/13/24  3:33 AM   Specimen: Anterior Nasal Swab  Result Value Ref Range Status   SARS Coronavirus 2 by RT PCR NEGATIVE NEGATIVE Final   Influenza A by PCR NEGATIVE NEGATIVE Final   Influenza B by PCR NEGATIVE NEGATIVE Final    Comment: (NOTE) The Xpert Xpress SARS-CoV-2/FLU/RSV plus assay is intended as an aid in the diagnosis of influenza from Nasopharyngeal swab specimens and should not be used as a sole basis for treatment. Nasal washings and aspirates  are unacceptable for Xpert Xpress SARS-CoV-2/FLU/RSV testing.  Fact Sheet for Patients: bloggercourse.com  Fact Sheet for Healthcare Providers: seriousbroker.it  This test is not yet approved or cleared by the [place]  FDA and has been authorized for detection and/or diagnosis of SARS-CoV-2 by FDA under an Emergency Use Authorization (EUA). This EUA will remain in effect (meaning this test can be used) for the duration of the COVID-19 declaration under Section 564(b)(1) of the Act, 21 U.S.C. section 360bbb-3(b)(1), unless the authorization is terminated or revoked.     Resp Syncytial Virus by PCR NEGATIVE NEGATIVE Final    Comment: (NOTE) Fact Sheet for Patients: bloggercourse.com  Fact Sheet for Healthcare Providers: seriousbroker.it  This test is not yet approved or cleared by the [place]  FDA and has been authorized for detection and/or diagnosis of SARS-CoV-2 by FDA under an Emergency Use Authorization (EUA). This EUA will remain in effect (meaning this test can be used) for the duration of the COVID-19 declaration under Section 564(b)(1) of the Act, 21 U.S.C. section 360bbb-3(b)(1), unless the authorization is terminated or revoked.  Performed at [facility], [address], [place], [place] [address]      Radiology Studies: DG Chest 2 View Result Date: 05/18/2024 CLINICAL DATA:  Pleural effusion.  Productive cough. EXAM: CHEST - 2 VIEW COMPARISON:  05/13/2024 FINDINGS: Improved lung aeration with decreasing lung base opacities and pleural effusion. Trace residual blunting of the right costophrenic angle. Mild patchy opacity at the periphery of the right lung base. Right upper extremity PICC tip in the upper SVC. The heart is mildly enlarged. No pneumothorax. IMPRESSION: 1. Improved lung aeration with decreasing lung base opacities and pleural effusions.  Trace residual blunting of the right costophrenic angle. 2. Mild patchy opacity at the periphery of the right lung base, may be atelectasis or pneumonia. Electronically Signed   By: Andrea Gasman M.D.   On: 05/18/2024 16:41   CARDIAC CATHETERIZATION Result Date: 05/18/2024 1. Normal filling pressure and PA pressure. 2. Normal cardiac output on milrinone .     Scheduled Meds:  allopurinol   100 mg Oral Daily   aspirin  EC  81 mg Oral Daily   atorvastatin   80 mg Oral Daily   Chlorhexidine  Gluconate Cloth  6 each Topical Daily   empagliflozin   10 mg Oral Daily   enoxaparin  (LOVENOX ) injection  40 mg Subcutaneous Daily   ferrous sulfate  325 mg Oral Q breakfast   insulin  aspart  0-9 Units Subcutaneous TID WC   isosorbide -hydrALAZINE   2 tablet Oral TID   mexiletine  150 mg Oral Q12H   polyethylene glycol  17 g Oral Once   potassium chloride   40 mEq Oral Once   sodium chloride  flush  10-40 mL Intracatheter Q12H   tamsulosin   0.4 mg Oral Daily   torsemide  20 mg Oral Daily   Continuous Infusions:     LOS: 7 days    Time spent:    Sigurd Pac, MD [facility]   05/20/2024, 7:46 AM    "

## 2024-05-20 NOTE — Progress Notes (Signed)
 Physical Therapy Treatment Patient Details Name: Thomas Gardner MRN: [id number] DOB: 12/21/1955 Today's Date: 05/20/2024   History of Present Illness Pt is a 69 y/o male admitted 05/13/24 with shortness of breath. Bil pleural effusions with ?pna; acute on chronic CHF (EF <20%); elevated troponin likely myocardial injury per Cardiology; PMH includes: arthritis, CHF, CAD, DM, HTN, morbid obesity, CKD, gout, prostate cancer, CVA.    PT Comments  Pt received in supine and agreeable to session. Pt requests to use the bathroom at the beginning of the session and is able to ambulate with CGA for safety due to quick fatigue. Deferred further gait to focus on stair trial. Pt able to complete 6 steps with CGA for safety and good stability with BUE support on rail. Pt limited by impaired activity tolerance and education provided on activity progression for improved endurance and strength. Pt continues to benefit from PT services to progress toward functional mobility goals.     If plan is discharge home, recommend the following: Assistance with cooking/housework;Help with stairs or ramp for entrance   Can travel by private vehicle        Equipment Recommendations  Rolling walker (2 wheels)    Recommendations for Other Services       Precautions / Restrictions Precautions Precautions: Fall Recall of Precautions/Restrictions: Intact Restrictions Weight Bearing Restrictions Per Provider Order: No     Mobility  Bed Mobility Overal bed mobility: Modified Independent                  Transfers Overall transfer level: Needs assistance Equipment used: Rolling walker (2 wheels) Transfers: Sit to/from Stand Sit to Stand: Supervision           General transfer comment: STS from EOB and chair with increased time and effort for rise    Ambulation/Gait Ambulation/Gait assistance: Contact guard assist Gait Distance (Feet): 10 Feet Assistive device: Rolling walker (2 wheels) Gait  Pattern/deviations: Step-through pattern, Decreased stride length, Decreased stance time - right Gait velocity: reduced     General Gait Details: slow gait with reliance on BUE support. Deferred further gait for focus on stair trial   Stairs Stairs: Yes Stairs assistance: Contact guard assist Stair Management: One rail Left, Sideways Number of Stairs: 6 General stair comments: reliance on UE support, but no LOB   Wheelchair Mobility     Tilt Bed    Modified Rankin (Stroke Patients Only)       Balance Overall balance assessment: Needs assistance Sitting-balance support: No upper extremity supported, Feet supported Sitting balance-Leahy Scale: Good Sitting balance - Comments: seated EOB   Standing balance support: Bilateral upper extremity supported, During functional activity, Reliant on assistive device for balance Standing balance-Leahy Scale: Poor Standing balance comment: reliant on RW support                            Communication Communication Communication: No apparent difficulties  Cognition Arousal: Alert Behavior During Therapy: WFL for tasks assessed/performed   PT - Cognitive impairments: No apparent impairments                         Following commands: Intact      Cueing Cueing Techniques: Verbal cues, Visual cues  Exercises      General Comments        Pertinent Vitals/Pain Pain Assessment Pain Assessment: Faces Faces Pain Scale: Hurts a little bit Pain Location: BLE's Pain  Descriptors / Indicators: Discomfort Pain Intervention(s): Monitored during session     PT Goals (current goals can now be found in the care plan section) Acute Rehab PT Goals Patient Stated Goal: improve his shortness of breath before going home PT Goal Formulation: With patient Time For Goal Achievement: 05/28/24 Progress towards PT goals: Progressing toward goals    Frequency    Min 2X/week       AM-PAC PT 6 Clicks Mobility    Outcome Measure  Help needed turning from your back to your side while in a flat bed without using bedrails?: None Help needed moving from lying on your back to sitting on the side of a flat bed without using bedrails?: None Help needed moving to and from a bed to a chair (including a wheelchair)?: A Little Help needed standing up from a chair using your arms (e.g., wheelchair or bedside chair)?: A Little Help needed to walk in hospital room?: A Little Help needed climbing 3-5 steps with a railing? : A Little 6 Click Score: 20    End of Session Equipment Utilized During Treatment: Gait belt Activity Tolerance: Patient limited by fatigue Patient left: in chair;with nursing/sitter in room;with call bell/phone within reach (at sink) Nurse Communication: Mobility status PT Visit Diagnosis: Unsteadiness on feet (R26.81);History of falling (Z91.81)     Time: 9167-9142 PT Time Calculation (min) (ACUTE ONLY): 25 min  Charges:    $Gait Training: 8-22 mins $Therapeutic Activity: 8-22 mins PT General Charges $$ ACUTE PT VISIT: 1 Visit                    Darryle George, PTA Acute Rehabilitation Services Secure Chat Preferred  [phone]    Darryle George 05/20/2024, 9:55 AM

## 2024-05-20 NOTE — Progress Notes (Signed)
 Patient ID: Thomas Gardner, male   DOB: 1955/09/21, 69 y.o.   MRN: [id number]     Advanced Heart Failure Rounding Note  Cardiologist: Thomas Scarce, MD  AHF Cardiologist: Thomas Gardner Chief Complaint: A/C HFrEf Patient Profile   [name] is a 69 y.o. male with iCM, chronic HFrEF, unvascularized 2v CAD (pRCA CTA and 70% pLAD/mLAD, 80% dLAD), HTN, HLD, DMII, CKD stage 3, morbid obesity, PVCs, asthma, prostate cancer s/p radiation, OSA, CVA (10/22') and carotid stenosis.   Significant events:   05/17/24: AHF team consulted for concerns of low output HF. PICC placed. Milrinone  started. Diuretics increased.   Subjective:    He has ongoing cough but CVP only about 5 on my read.  Co-ox 75% on milrinone  0.125.  Creatinine now trending down, 2.58 => 2.35.   Objective:   Weight Range: 103.5 kg Body mass index is 36.83 kg/m.   Vital Signs:   Temp:  [97.7 F (36.5 C)-98.5 F (36.9 C)] 98.5 F (36.9 C) (01/01 0356) Pulse Rate:  [92-104] 92 (12/31 2350) Resp:  [17-18] 18 (01/01 0356) BP: (116-160)/(56-104) 116/61 (01/01 0356) SpO2:  [94 %-96 %] 94 % (12/31 2350) Weight:  [103.5 kg] 103.5 kg (01/01 0356) Last BM Date : 05/17/24  Weight change: Filed Weights   05/18/24 0300 05/19/24 0305 05/20/24 0356  Weight: 104.9 kg 103.4 kg 103.5 kg    Intake/Output:   Intake/Output Summary (Last 24 hours) at 05/20/2024 0731 Last data filed at 05/19/2024 2311 Gross per 24 hour  Intake 600 ml  Output 1200 ml  Net -600 ml     Physical Exam   General: NAD Neck: No JVD, no thyromegaly or thyroid nodule.  Lungs: Clear to auscultation bilaterally with normal respiratory effort. CV: Nondisplaced PMI.  Heart regular S1/S2, no S3/S4, no murmur.  Trace ankle edema.  Abdomen: Soft, nontender, no hepatosplenomegaly, no distention.  Skin: Intact without lesions or rashes.  Neurologic: Alert and oriented x 3.  Psych: Normal affect. Extremities: No clubbing or cyanosis.  HEENT: Normal.    Telemetry   NSR with PVCs (Personally reviewed)    Labs   CBC Recent Labs    05/19/24 0459 05/20/24 0434  WBC 7.6 9.1  HGB 9.9* 10.0*  HCT 30.8* 31.6*  MCV 80.8 83.2  PLT 180 238   Basic Metabolic Panel Recent Labs    87/68/74 0459 05/20/24 0434  NA 138 139  K 3.6 3.6  CL 101 102  CO2 27 26  GLUCOSE 189* 170*  BUN 43* 38*  CREATININE 2.58* 2.35*  CALCIUM  8.5* 8.9  MG 2.1 2.2   Liver Function Tests No results for input(s): AST, ALT, ALKPHOS, BILITOT, PROT, ALBUMIN in the last 72 hours. No results for input(s): LIPASE, AMYLASE in the last 72 hours. Cardiac Enzymes No results for input(s): CKTOTAL, CKMB, CKMBINDEX, TROPONINI in the last 72 hours.  BNP: BNP (last 3 results) Recent Labs    01/13/24 1208 01/30/24 1340  BNP 170.3* 119.0*    ProBNP (last 3 results) Recent Labs    05/13/24 0051  PROBNP 8,752.0*     D-Dimer No results for input(s): DDIMER in the last 72 hours. Hemoglobin A1C No results for input(s): HGBA1C in the last 72 hours. Fasting Lipid Panel No results for input(s): CHOL, HDL, LDLCALC, TRIG, CHOLHDL, LDLDIRECT in the last 72 hours. Medications:   Scheduled Medications:  allopurinol   100 mg Oral Daily   aspirin  EC  81 mg Oral Daily   atorvastatin   80 mg  Oral Daily   Chlorhexidine  Gluconate Cloth  6 each Topical Daily   empagliflozin   10 mg Oral Daily   enoxaparin  (LOVENOX ) injection  40 mg Subcutaneous Daily   ferrous sulfate  325 mg Oral Q breakfast   insulin  aspart  0-9 Units Subcutaneous TID WC   isosorbide -hydrALAZINE   2 tablet Oral TID   potassium chloride   40 mEq Oral Once   sodium chloride  flush  10-40 mL Intracatheter Q12H   tamsulosin   0.4 mg Oral Daily   torsemide  20 mg Oral Daily    Infusions:    PRN Medications: acetaminophen  **OR** acetaminophen , albuterol , guaiFENesin -dextromethorphan , lip balm, loratadine , senna, sodium chloride  flush  Assessment/Plan   1.  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 History of frequent PVCs (9.6% on Zio in 8/24) as well as HTN.  Echo back in 7/22 with EF 35-40%.  Echoes in 10/22 and 8/24 with EF 50%, echo in 8/25 with EF 35-40% led to cath showing occluded RCA with collaterals and long 70% proximal LAD stenosis (hemodynamically significant by FFR) as well as 80% distal LAD stenosis.  With this admission, echo showed EF <20% with moderate LV dilation, RV normal, IVC dilated.  EF is decreased out of proportion to cath.  Based on Revived-BCIS data, decision made not to intervene on the moderate (but hemodynamically significant) LAD stenosis given lack of ACS/chest pain.  Milrinone  begun 12/29 (co-ox 46%).  He has diuresed well but creatinine rose to 2.58. RHC on milrinone  0.125 showed good CO and normal filling pressures.  He has residual peripheral edema that is likely due to venous insufficiency. Co-ox 75% today, CVP 5 and creatinine now starting to trend down (2.35 today).   - Start torsemide 20 mg po daily, may need to go home on higher dose but will watch response today.  - Stop milrinone  today.  - Continue Unna boots.  - Increase Bidil  to 2 tabs tid.  - Continue Jardiance  10 daily.  - GDMT will be limited by AKI on CKD stage 3 => Entresto  and spironolactone  will remain on hold for now.  2. CAD: Cath 10/25 showed occluded RCA with collaterals and long 70% proximal LAD stenosis (hemodynamically significant by FFR) as well as 80% distal LAD stenosis.  Patient has not had chest pain, presentation not consistent with ACS.  Seen by Dr. Wendel initially, and based on Revived-BCIS data, decision made not to intervene on the moderate (but hemodynamically significant) LAD stenosis given lack of ACS/chest pain. I think this is reasonable as I am not sure he would derive much benefit in absence of ACS.  - Continue ASA 81 - Continue atorvastatin  (on Repatha  at home).  3. AKI  on CKD stage 3: Baseline creatinine around 1.6, up to 2.58 but now trending down to 2.35.  Suspect cardiorenal syndrome. CVP 5 today.   4. H/o CVA in 10/22 with mod-severe right PCA stenosis on CTA head/neck.  - Continue ASA 81 - Continue atorvastatin  80, on Repatha  at home.  5. PVCs: H/o frequent PVCs, ?contribute to cardiomyopathy.   - On amiodarone  IV while on milrinone . - Will transition him to mexiletine 150 bid now that we are stopping milrinone  for long-term PVC suppression.  6. OSA: Did not tolerate CPAP.  7. Pleural effusions: Noted on initial CXR.  Followup CXR was improved.    Watch today off milrinone  and amiodarone , hopefully home tomorrow.   Length of Stay: 7  Thomas Shuck, MD  05/20/2024, 7:31 AM  Advanced Heart Failure Team Pager [phone] (M-F; 7a - 5p)   Please visit [email or web address]: For overnight coverage please call cardiology fellow first. If fellow not available call Shock/ECMO MD on call.  For ECMO / Mechanical Support (Impella, IABP, LVAD) issues call Shock / ECMO MD on call.

## 2024-05-20 NOTE — Plan of Care (Signed)

## 2024-05-21 ENCOUNTER — Other Ambulatory Visit (HOSPITAL_COMMUNITY): Payer: Self-pay

## 2024-05-21 DIAGNOSIS — I5023 Acute on chronic systolic (congestive) heart failure: Secondary | ICD-10-CM | POA: Diagnosis not present

## 2024-05-21 LAB — BASIC METABOLIC PANEL WITH GFR
Anion gap: 8 (ref 5–15)
BUN: 34 mg/dL — ABNORMAL HIGH (ref 8–23)
CO2: 28 mmol/L (ref 22–32)
Calcium: 8.9 mg/dL (ref 8.9–10.3)
Chloride: 104 mmol/L (ref 98–111)
Creatinine, Ser: 2.28 mg/dL — ABNORMAL HIGH (ref 0.61–1.24)
GFR, Estimated: 30 mL/min — ABNORMAL LOW
Glucose, Bld: 179 mg/dL — ABNORMAL HIGH (ref 70–99)
Potassium: 4 mmol/L (ref 3.5–5.1)
Sodium: 140 mmol/L (ref 135–145)

## 2024-05-21 LAB — COOXEMETRY PANEL
Carboxyhemoglobin: 1.3 % (ref 0.5–1.5)
Methemoglobin: <0.7 % (ref 0.0–1.5)
O2 Saturation: 67.7 %
Total hemoglobin: 9.5 g/dL — ABNORMAL LOW (ref 12.0–16.0)

## 2024-05-21 LAB — GLUCOSE, CAPILLARY
Glucose-Capillary: 139 mg/dL — ABNORMAL HIGH (ref 70–99)
Glucose-Capillary: 183 mg/dL — ABNORMAL HIGH (ref 70–99)

## 2024-05-21 MED ORDER — CARVEDILOL 3.125 MG PO TABS
3.1250 mg | ORAL_TABLET | Freq: Two times a day (BID) | ORAL | 0 refills | Status: DC
  Filled 2024-05-21: qty 60, 30d supply, fill #0

## 2024-05-21 MED ORDER — ISOSORB DINITRATE-HYDRALAZINE 20-37.5 MG PO TABS
2.0000 | ORAL_TABLET | Freq: Three times a day (TID) | ORAL | 0 refills | Status: DC
  Filled 2024-05-21: qty 180, 30d supply, fill #0

## 2024-05-21 MED ORDER — LANTUS SOLOSTAR 100 UNIT/ML ~~LOC~~ SOPN: PEN_INJECTOR | SUBCUTANEOUS | Status: AC

## 2024-05-21 MED ORDER — EMPAGLIFLOZIN 10 MG PO TABS
10.0000 mg | ORAL_TABLET | Freq: Every morning | ORAL | 0 refills | Status: DC
  Filled 2024-05-21: qty 30, 30d supply, fill #0

## 2024-05-21 MED ORDER — TORSEMIDE 20 MG PO TABS
20.0000 mg | ORAL_TABLET | Freq: Every day | ORAL | 0 refills | Status: DC
  Filled 2024-05-21: qty 30, 30d supply, fill #0

## 2024-05-21 MED ORDER — MEXILETINE HCL 150 MG PO CAPS
150.0000 mg | ORAL_CAPSULE | Freq: Two times a day (BID) | ORAL | 0 refills | Status: AC
  Filled 2024-05-21: qty 60, 30d supply, fill #0

## 2024-05-21 MED ORDER — ATORVASTATIN CALCIUM 80 MG PO TABS
80.0000 mg | ORAL_TABLET | Freq: Every day | ORAL | 0 refills | Status: DC
  Filled 2024-05-21: qty 30, 30d supply, fill #0

## 2024-05-21 MED ORDER — POTASSIUM CHLORIDE CRYS ER 20 MEQ PO TBCR
40.0000 meq | EXTENDED_RELEASE_TABLET | Freq: Once | ORAL | Status: AC
  Administered 2024-05-21: 40 meq via ORAL
  Filled 2024-05-21: qty 2

## 2024-05-21 MED ORDER — CARVEDILOL 3.125 MG PO TABS
3.1250 mg | ORAL_TABLET | Freq: Two times a day (BID) | ORAL | Status: DC
  Administered 2024-05-21: 3.125 mg via ORAL
  Filled 2024-05-21: qty 1

## 2024-05-21 NOTE — TOC Transition Note (Signed)
 Transition of Care [facility]) - Discharge Note   Patient Details  Name: Thomas Gardner MRN: [id number] Date of Birth: December 11, 1955  [facility] CM/SW Contact:  Arlana JINNY Nicholaus [place] Phone Number: [phone] 05/21/2024, 11:44 AM   Clinical Narrative:   HF CSW called to schedule patients hospital follow up appointment for 06/03/24 at 4:00 PM.     Final next level of care: Home w Home Health Services Barriers to Discharge: Barriers Resolved   Patient Goals and CMS Choice Patient states their goals for this hospitalization and ongoing recovery are:: wants to recover CMS Medicare.gov Compare Post Acute Care list provided to:: Patient Choice offered to / list presented to : Patient      Discharge Placement                       Discharge Plan and Services Additional resources added to the After Visit Summary for     Discharge Planning Services: CM Consult Post Acute Care Choice: Home Health, Durable Medical Equipment          DME Arranged: Walker rolling, Patient refused services DME Agency: NA       HH Arranged: PT, OT HH Agency: [facility] Date [facility] Agency Contacted: 05/21/24 Time HH Agency Contacted: 1117 Representative spoke with at [facility] Agency: [facility]  Social Drivers of Health (SDOH) Interventions SDOH Screenings   Food Insecurity: No Food Insecurity (05/13/2024)  Housing: Low Risk (05/13/2024)  Transportation Needs: No Transportation Needs (05/13/2024)  Utilities: Not At Risk (05/13/2024)  Social Connections: Patient Declined (05/13/2024)  Tobacco Use: Low Risk (05/13/2024)     Readmission Risk Interventions    05/21/2024   11:17 AM  Readmission Risk Prevention Plan  Transportation Screening Complete  PCP or Specialist Appt within 5-7 Days Complete  Home Care Screening Complete  Medication Review (RN CM) Complete

## 2024-05-21 NOTE — Progress Notes (Signed)
 Occupational Therapy Treatment Patient Details Name: Thomas Gardner MRN: [id number] DOB: Jul 20, 1955 Today's Date: 05/21/2024   History of present illness Pt is a 69 y/o male admitted 05/13/24 with shortness of breath. Bil pleural effusions with ?pna; acute on chronic CHF (EF <20%); elevated troponin likely myocardial injury per Cardiology; PMH includes: arthritis, CHF, CAD, DM, HTN, morbid obesity, CKD, gout, prostate cancer, CVA.   OT comments  Pt. Seen for skilled OT treatment session.  Session focused on LB dressing tech. For safety and energy conservation during completion of ADLs.  Introduction and review of EC strategies through use of the 5Ps.  Pt. Receptive to education and able to provide examples of systems already in place at home.  Pt. Verbalizes eagerness to achieve necessary diet changes and cont. To work on increasing activity tolerance during mobility and ADLs.        If plan is discharge home, recommend the following:  A little help with walking and/or transfers;A little help with bathing/dressing/bathroom;Assistance with cooking/housework;Assist for transportation;Help with stairs or ramp for entrance   Equipment Recommendations  None recommended by OT    Recommendations for Other Services      Precautions / Restrictions Precautions Precautions: Fall Recall of Precautions/Restrictions: Intact       Mobility Bed Mobility    Seated eob for duration of session                Transfers                         Balance                                           ADL either performed or assessed with clinical judgement   ADL Overall ADL's : Needs assistance/impaired                     Lower Body Dressing: Set up;Sitting/lateral leans Lower Body Dressing Details (indicate cue type and reason): pt. able to demonstrate turning in the bed to the L/R to access each LE.  reports this is how he performed LB dressing prior to  admission also.  cues for rest breaks after each extremity as pt. with notable fatigue   Toilet Transfer Details (indicate cue type and reason): pt. declined to use. reports he is amb. to/from b.room w/o staff assistance with utilization of IV pole for support prn and descrbied furniture walking with use of sink counter to/from b.room also           General ADL Comments: introduction and review of EC strategies with use of the 5Ps.  pt. receptive to education and had examples of use already in place ie: seated for ub/lb bathing at sink vs. standing.  pt. with questions/concerns about diet modifications and suggestions for healtier eating.  he states he has a family member that is a financial risk analyst and can assist and also is a part of a group with the [employer] that he mentioned he could consult with about recpies and suggestions.  discussed frozen/fresh vs. canned and boxed items.  reviewed lower carbs and lower sodium options per pt. request    Extremity/Trunk Assessment              Vision       Perception     Praxis  Communication Communication Communication: No apparent difficulties   Cognition Arousal: Alert Behavior During Therapy: WFL for tasks assessed/performed Cognition: No apparent impairments                               Following commands: Intact        Cueing   Cueing Techniques: Verbal cues, Visual cues  Exercises      Shoulder Instructions       General Comments      Pertinent Vitals/ Pain       Pain Assessment Pain Assessment: No/denies pain  Home Living                                          Prior Functioning/Environment              Frequency  Min 2X/week        Progress Toward Goals  OT Goals(current goals can now be found in the care plan section)  Progress towards OT goals: Progressing toward goals     Plan      Co-evaluation                 AM-PAC OT 6 Clicks Daily  Activity     Outcome Measure   Help from another person eating meals?: A Little Help from another person taking care of personal grooming?: A Little Help from another person toileting, which includes using toliet, bedpan, or urinal?: A Little Help from another person bathing (including washing, rinsing, drying)?: A Lot Help from another person to put on and taking off regular upper body clothing?: A Little Help from another person to put on and taking off regular lower body clothing?: A Lot 6 Click Score: 16    End of Session    OT Visit Diagnosis: Unsteadiness on feet (R26.81);Other abnormalities of gait and mobility (R26.89);Muscle weakness (generalized) (M62.81)   Activity Tolerance Patient tolerated treatment well   Patient Left in bed;Other (comment) (seated eob)   Nurse Communication Other (comment) (rn states ok to work with pt.)        Time: [phone] OT Time Calculation (min): 18 min  Charges: OT General Charges $OT Visit: 1 Visit OT Treatments $Self Care/Home Management : 8-22 mins  Randall, COTA/L Acute Rehabilitation [phone]   CHRISTELLA Nest Lorraine-COTA/L  05/21/2024, 10:21 AM

## 2024-05-21 NOTE — Plan of Care (Signed)
   Problem: Education: Goal: Knowledge of General Education information will improve Description: Including pain rating scale, medication(s)/side effects and non-pharmacologic comfort measures Outcome: Progressing   Problem: Clinical Measurements: Goal: Cardiovascular complication will be avoided Outcome: Progressing

## 2024-05-21 NOTE — Discharge Summary (Signed)
 Physician Discharge Summary  Thomas Gardner FMW:[id number] DOB: [birth year] DOA: 05/13/2024  PCP: Maree Leni Edyth DELENA, MD  Admit date: 05/13/2024 Discharge date: 05/21/2024  Time spent 45 minutes  Recommendations for Outpatient Follow-up:  Advanced heart failure clinic on 1/14 PCP in 1 week, titrate insulin  dose as needed  Discharge Diagnoses:  Principal Problem:   Acute on chronic HFrEF (heart failure with reduced ejection fraction) (HCC) Active Problems:   Essential hypertension   Hyperlipidemia   Gout   Sleep apnea   Type 2 diabetes mellitus with hyperglycemia (HCC)   Acute renal failure superimposed on stage 3b chronic kidney disease (HCC)   Anemia due to chronic kidney disease   Elevated troponin   CAP (community acquired pneumonia)   Acute on chronic congestive heart failure (HCC)   Myocardial injury   Coronary artery disease involving coronary bypass graft of native heart without angina pectoris   Discharge Condition: Improved  Diet recommendation: Diabetic, low-sodium, heart healthy  Filed Weights   05/19/24 0305 05/20/24 0356 05/21/24 0407  Weight: 103.4 kg 103.5 kg 101.6 kg    History of present illness:  69/M with history of CAD, ischemic cardiomyopathy, systolic CHF, two-vessel CAD, hypertension, diabetes, CKD 3A, obesity, untreated OSA, CVA admitted with CHF - Echo this admission with worsening cardiomyopathy, EF less than 20%, concern for low output on admission, started on milrinone , - Improving with diuresis, CHF team following    Hospital Course:   Acute on chronic systolic CHF: - Known systolic CHF, EF around 35-40% - Echo this admission with EF less than 20%, normal RV - Admitted with fluid overload, cough slow initially, started on milrinone  12/29 - Also complicated by AKI/CKD 3 - RHC 12/30 noted normal filling pressures and normal cardiac index on milrinone  - Milrinone  discontinued yesterday, remains euvolemic - Per CHF team, continue BiDil ,  Jardiance , switch to oral torsemide - GDMT limited by AKI/CKD - Discharged home in a stable condition has follow-up in heart failure clinic on 1/14   CAD - Known occluded RCA with collaterals, 70% LAD stenosis and 80% distal LAD - On medical management at this time - Continue aspirin  and statin   AKI on CKD 3A - Likely cardiorenal syndrome, now off Lasix , - Creatinine stabilizing, no improvement yet   H/o CVA in 10/22 with mod-severe right PCA stenosis on CTA head/neck.  - Continue aspirin  and statin    PVCs: H/o frequent PVCs - On amiodarone  IV while on milrinone .   OSA: Did not tolerate CPAP.    Pleural effusions Improved on repeat x-ray   DM 2 -stable, aic is 6.8 - Insulin  dose decreased    Discharge Exam: Vitals:   05/21/24 0815 05/21/24 0820  BP:  133/61  Pulse:  100  Resp:  16  Temp:  97.6 F (36.4 C)  SpO2: 93% 91%   Gen: Awake, Alert, Oriented X 3,  HEENT: no JVD Lungs: Good air movement bilaterally, CTAB CVS: S1S2/RRR Abd: soft, Non tender, non distended, BS present Extremities: No edema Skin: no new rashes on exposed skin   Discharge Instructions   Discharge Instructions     Diet Carb Modified   Complete by: As directed    Diet general   Complete by: As directed    Increase activity slowly   Complete by: As directed       Allergies as of 05/21/2024       Reactions   Erythromycin Nausea And Vomiting   Nsaids Other (See Comments)   Contraindication  due to CKD    Onion Nausea And Vomiting        Medication List     STOP taking these medications    furosemide  20 MG tablet Commonly known as: LASIX    metoprolol  succinate 50 MG 24 hr tablet Commonly known as: TOPROL -XL   sacubitril -valsartan  49-51 MG Commonly known as: Entresto    spironolactone  25 MG tablet Commonly known as: ALDACTONE        TAKE these medications    Accu-Chek Guide Test test strip Generic drug: glucose blood Use to test Blood Sugars once daily    Accu-Chek Softclix Lancets lancets Use to test Blood Sugars daily.   acetaminophen  325 MG tablet Commonly known as: TYLENOL  Take 1-2 tablets (325-650 mg total) by mouth every 4 (four) hours as needed for mild pain.   albuterol  108 (90 Base) MCG/ACT inhaler Commonly known as: VENTOLIN  HFA Inhale 1-2 puffs into the lungs every 6 (six) hours as needed. What changed: reasons to take this   allopurinol  100 MG tablet Commonly known as: ZYLOPRIM  Take 1 tablet (100 mg total) by mouth daily. What changed:  how much to take when to take this   ascorbic acid  500 MG tablet Commonly known as: VITAMIN C  Take 1 tablet (500 mg total) by mouth daily.   Aspirin  Low Dose 81 MG tablet Generic drug: aspirin  EC Take 1 tablet (81 mg total) by mouth daily.   atorvastatin  80 MG tablet Commonly known as: LIPITOR Take 1 tablet (80 mg total) by mouth daily.   carvedilol  3.125 MG tablet Commonly known as: COREG  Take 1 tablet (3.125 mg total) by mouth 2 (two) times daily with a meal.   empagliflozin  10 MG Tabs tablet Commonly known as: Jardiance  Take 1 tablet (10 mg total) by mouth in the morning. What changed:  medication strength how much to take   FreeStyle Libre 3 Sensor Misc Use as directed for blood sugar monitoring at least three times daily and as needed   isosorbide -hydrALAZINE  20-37.5 MG tablet Commonly known as: BIDIL  Take 2 tablets by mouth 3 (three) times daily.   Lantus  SoloStar 100 UNIT/ML Solostar Pen Generic drug: insulin  glargine Inject 8 Units into the skin every morning AND 5 Units at bedtime. What changed: See the new instructions.   mexiletine 150 MG capsule Commonly known as: MEXITIL Take 1 capsule (150 mg total) by mouth every 12 (twelve) hours.   Mounjaro  2.5 MG/0.5ML Pen Generic drug: tirzepatide  Inject 2.5 mg into the skin once a week.   Repatha  SureClick 140 MG/ML Soaj Generic drug: Evolocumab  Inject 140 mg into the skin every 14 (fourteen) days. Please  keep your upcoming appointment for refills.   tamsulosin  0.4 MG Caps capsule Commonly known as: FLOMAX  Take 1 capsule (0.4 mg total) by mouth daily.   torsemide 20 MG tablet Commonly known as: DEMADEX Take 1 tablet (20 mg total) by mouth daily.   vitamin D3 25 MCG tablet Commonly known as: CHOLECALCIFEROL  Take 1 tablet (1,000 Units total) by mouth daily.       Allergies[1]  Follow-up Information     Maree Leni Edyth DELENA, MD Follow up.   Specialty: Family Medicine Contact information: [address] [place] [address] [phone]          [facility] Follow up on 06/02/2024.   Specialty: Cardiology Why: Follow up in Advanced Heart Failure Clinic 1/14 at 1130 Contact information: [address] [place]  [phone]  The results of significant diagnostics from this hospitalization (including imaging, microbiology, ancillary and laboratory) are listed below for reference.    Significant Diagnostic Studies: DG Chest 2 View Result Date: 05/18/2024 CLINICAL DATA:  Pleural effusion.  Productive cough. EXAM: CHEST - 2 VIEW COMPARISON:  05/13/2024 FINDINGS: Improved lung aeration with decreasing lung base opacities and pleural effusion. Trace residual blunting of the right costophrenic angle. Mild patchy opacity at the periphery of the right lung base. Right upper extremity PICC tip in the upper SVC. The heart is mildly enlarged. No pneumothorax. IMPRESSION: 1. Improved lung aeration with decreasing lung base opacities and pleural effusions. Trace residual blunting of the right costophrenic angle. 2. Mild patchy opacity at the periphery of the right lung base, may be atelectasis or pneumonia. Electronically Signed   By: Andrea Gasman M.D.   On: 05/18/2024 16:41   CARDIAC CATHETERIZATION Result Date: 05/18/2024 1. Normal filling pressure and PA pressure. 2. Normal cardiac output  on milrinone .   VAS US  LOWER EXTREMITY VENOUS (DVT) Result Date: 05/18/2024  Lower Venous DVT Study Patient Name:  Thomas Gardner  Date of Exam:   05/17/2024 Medical Rec #: [id number]        Accession #:    [id number] Date of Birth: November 25, 1955        Patient Gender: M Patient Age:   43 years Exam Location:  [facility] Procedure:      VAS US  LOWER EXTREMITY VENOUS (DVT) Referring Phys: NORVAL BAR --------------------------------------------------------------------------------  Indications: Edema. Other Indications: Acute on chronic CHF. Limitations: Poor ultrasound/tissue interface. Comparison Study: No previous exams Performing Technologist: Jody Hill RVT, RDMS  Examination Guidelines: A complete evaluation includes B-mode imaging, spectral Doppler, color Doppler, and power Doppler as needed of all accessible portions of each vessel. Bilateral testing is considered an integral part of a complete examination. Limited examinations for reoccurring indications may be performed as noted. The reflux portion of the exam is performed with the patient in reverse Trendelenburg.  +---------+---------------+---------+-----------+----------+--------------+ RIGHT    CompressibilityPhasicitySpontaneityPropertiesThrombus Aging +---------+---------------+---------+-----------+----------+--------------+ CFV      Full           Yes      Yes                                 +---------+---------------+---------+-----------+----------+--------------+ SFJ      Full                                                        +---------+---------------+---------+-----------+----------+--------------+ FV Prox  Full           Yes      Yes                                 +---------+---------------+---------+-----------+----------+--------------+ FV Mid   Full           Yes      Yes                                 +---------+---------------+---------+-----------+----------+--------------+ FV  DistalFull           Yes      Yes                                 +---------+---------------+---------+-----------+----------+--------------+  PFV      Full                                                        +---------+---------------+---------+-----------+----------+--------------+ POP      Full           Yes      Yes                                 +---------+---------------+---------+-----------+----------+--------------+ PTV      Full                                                        +---------+---------------+---------+-----------+----------+--------------+ PERO     Full                                                        +---------+---------------+---------+-----------+----------+--------------+   +---------+---------------+---------+-----------+----------+-------------------+ LEFT     CompressibilityPhasicitySpontaneityPropertiesThrombus Aging      +---------+---------------+---------+-----------+----------+-------------------+ CFV      Full           Yes      Yes                                      +---------+---------------+---------+-----------+----------+-------------------+ SFJ      Full                                                             +---------+---------------+---------+-----------+----------+-------------------+ FV Prox  Full           Yes      Yes                                      +---------+---------------+---------+-----------+----------+-------------------+ FV Mid   Full           Yes      Yes                                      +---------+---------------+---------+-----------+----------+-------------------+ FV DistalFull           Yes      Yes                                      +---------+---------------+---------+-----------+----------+-------------------+ PFV      Full                                                              +---------+---------------+---------+-----------+----------+-------------------+  POP      Full           Yes      Yes                                      +---------+---------------+---------+-----------+----------+-------------------+ PTV                                                   Not well visualized +---------+---------------+---------+-----------+----------+-------------------+ PERO     Full                                                             +---------+---------------+---------+-----------+----------+-------------------+     Summary: BILATERAL: - No evidence of deep vein thrombosis seen in the lower extremities, bilaterally. -No evidence of popliteal cyst, bilaterally.   *See table(s) above for measurements and observations. Electronically signed by Fonda Rim on 05/18/2024 at 9:24:21 AM.    Final    US  EKG SITE RITE Result Date: 05/17/2024 If Site Rite image not attached, placement could not be confirmed due to current cardiac rhythm.  ECHOCARDIOGRAM COMPLETE Result Date: 05/13/2024    ECHOCARDIOGRAM REPORT   Patient Name:   Thomas Gardner Date of Exam: 05/13/2024 Medical Rec #:  [id number]       Height:       66.0 in Accession #:    [id number]      Weight:       238.5 lb Date of Birth:  [birth year]       BSA:          2.155 m Patient Age:    68 years        BP:           134/76 mmHg Patient Gender: M               HR:           103 bpm. Exam Location:  Inpatient Procedure: 2D Echo, Cardiac Doppler, Color Doppler and Intracardiac            Opacification Agent (Both Spectral and Color Flow Doppler were            utilized during procedure). Indications:    Dyspnea R06  History:        Patient has prior history of Echocardiogram examinations, most                 recent 02/11/2024. Signs/Symptoms:Dyspnea.  Sonographer:    Merlynn Argyle Referring Phys: [id number] GILLIAN HERO [facility]  Sonographer Comments: Image acquisition challenging due to respiratory motion and Image  acquisition challenging due to patient body habitus. IMPRESSIONS  1. Left ventricular ejection fraction, by estimation, is <20%. The left ventricle has severely decreased function. The left ventricle demonstrates global hypokinesis. The left ventricular internal cavity size was moderately dilated. There is mild concentric left ventricular hypertrophy. Left ventricular diastolic parameters are indeterminate.  2. Right ventricular systolic function is normal. The right ventricular size is normal. Tricuspid regurgitation signal is inadequate for assessing PA pressure.  3. Left atrial size was moderately  dilated.  4. Large pleural effusion in the left lateral region.  5. The mitral valve is normal in structure. Mild to moderate mitral valve regurgitation. No evidence of mitral stenosis.  6. The aortic valve is tricuspid. There is mild calcification of the aortic valve. Aortic valve regurgitation is trivial. Aortic valve sclerosis/calcification is present, without any evidence of aortic stenosis.  7. The inferior vena cava is dilated in size with <50% respiratory variability, suggesting right atrial pressure of 15 mmHg. FINDINGS  Left Ventricle: Left ventricular ejection fraction, by estimation, is <20%. The left ventricle has severely decreased function. The left ventricle demonstrates global hypokinesis. Definity  contrast agent was given IV to delineate the left ventricular endocardial borders. The left ventricular internal cavity size was moderately dilated. There is mild concentric left ventricular hypertrophy. Left ventricular diastolic parameters are indeterminate. Right Ventricle: The right ventricular size is normal. No increase in right ventricular wall thickness. Right ventricular systolic function is normal. Tricuspid regurgitation signal is inadequate for assessing PA pressure. Left Atrium: Left atrial size was moderately dilated. Right Atrium: Right atrial size was normal in size. Pericardium: There is no  evidence of pericardial effusion. Mitral Valve: The mitral valve is normal in structure. Mild to moderate mitral valve regurgitation. No evidence of mitral valve stenosis. Tricuspid Valve: The tricuspid valve is normal in structure. Tricuspid valve regurgitation is trivial. No evidence of tricuspid stenosis. Aortic Valve: The aortic valve is tricuspid. There is mild calcification of the aortic valve. Aortic valve regurgitation is trivial. Aortic valve sclerosis/calcification is present, without any evidence of aortic stenosis. Pulmonic Valve: The pulmonic valve was normal in structure. Pulmonic valve regurgitation is not visualized. No evidence of pulmonic stenosis. Aorta: The aortic root is normal in size and structure. Venous: The inferior vena cava is dilated in size with less than 50% respiratory variability, suggesting right atrial pressure of 15 mmHg. IAS/Shunts: No atrial level shunt detected by color flow Doppler. Additional Comments: There is a large pleural effusion in the left lateral region.  LEFT VENTRICLE PLAX 2D LVIDd:         6.00 cm      Diastology LVIDs:         5.10 cm      LV e' medial:    7.29 cm/s LV PW:         1.10 cm      LV E/e' medial:  13.4 LV IVS:        1.00 cm      LV e' lateral:   10.30 cm/s LVOT diam:     2.10 cm      LV E/e' lateral: 9.5 LV SV:         46 LV SV Index:   22 LVOT Area:     3.46 cm LV IVRT:       77 msec  LV Volumes (MOD) LV vol d, MOD A2C: 132.5 ml LV vol d, MOD A4C: 188.0 ml LV vol s, MOD A2C: 97.3 ml LV vol s, MOD A4C: 132.0 ml LV SV MOD A2C:     35.2 ml LV SV MOD A4C:     188.0 ml LV SV MOD BP:      41.2 ml RIGHT VENTRICLE            IVC RV Basal diam:  3.10 cm    IVC diam: 2.20 cm RV S prime:     6.64 cm/s TAPSE (M-mode): 1.7 cm LEFT ATRIUM  Index        RIGHT ATRIUM           Index LA diam:        4.80 cm  2.23 cm/m   RA Area:     13.60 cm LA Vol (A2C):   63.0 ml  29.23 ml/m  RA Volume:   36.60 ml  16.98 ml/m LA Vol (A4C):   103.0 ml 47.79 ml/m  LA Biplane Vol: 81.1 ml  37.63 ml/m  AORTIC VALVE LVOT Vmax:   76.60 cm/s LVOT Vmean:  61.900 cm/s LVOT VTI:    0.134 m  AORTA Ao Root diam: 3.50 cm Ao Asc diam:  3.30 cm MITRAL VALVE MV Area (PHT): 6.48 cm    SHUNTS MV Decel Time: 117 msec    Systemic VTI:  0.13 m MV E velocity: 97.50 cm/s  Systemic Diam: 2.10 cm MV A velocity: 61.80 cm/s MV E/A ratio:  1.58 Toribio Fuel MD Electronically signed by Toribio Fuel MD Signature Date/Time: 05/13/2024/5:06:04 PM    Final    DG Chest Port 1 View Result Date: 05/13/2024 EXAM: 1 VIEW(S) XRAY OF THE CHEST 05/13/2024 12:52:48 AM COMPARISON: None available. CLINICAL HISTORY: sob FINDINGS: LUNGS AND PLEURA: Low lung volumes. Layering bilateral pleural effusions. Bibasilar airspace opacities. No pneumothorax. HEART AND MEDIASTINUM: Cardiomegaly. BONES AND SOFT TISSUES: No acute osseous abnormality. IMPRESSION: 1. Layering bilateral pleural effusions with bibasilar atelectasis or pneumonia. Electronically signed by: Norman Gatlin MD 05/13/2024 01:02 AM EST RP Workstation: [id number]    Microbiology: Recent Results (from the past 240 hours)  Resp panel by RT-PCR (RSV, Flu A&B, Covid) Anterior Nasal Swab     Status: None   Collection Time: 05/13/24  3:33 AM   Specimen: Anterior Nasal Swab  Result Value Ref Range Status   SARS Coronavirus 2 by RT PCR NEGATIVE NEGATIVE Final   Influenza A by PCR NEGATIVE NEGATIVE Final   Influenza B by PCR NEGATIVE NEGATIVE Final    Comment: (NOTE) The Xpert Xpress SARS-CoV-2/FLU/RSV plus assay is intended as an aid in the diagnosis of influenza from Nasopharyngeal swab specimens and should not be used as a sole basis for treatment. Nasal washings and aspirates are unacceptable for Xpert Xpress SARS-CoV-2/FLU/RSV testing.  Fact Sheet for Patients: bloggercourse.com  Fact Sheet for Healthcare Providers: seriousbroker.it  This test is not yet approved or cleared by  the [place]  FDA and has been authorized for detection and/or diagnosis of SARS-CoV-2 by FDA under an Emergency Use Authorization (EUA). This EUA will remain in effect (meaning this test can be used) for the duration of the COVID-19 declaration under Section 564(b)(1) of the Act, 21 U.S.C. section 360bbb-3(b)(1), unless the authorization is terminated or revoked.     Resp Syncytial Virus by PCR NEGATIVE NEGATIVE Final    Comment: (NOTE) Fact Sheet for Patients: bloggercourse.com  Fact Sheet for Healthcare Providers: seriousbroker.it  This test is not yet approved or cleared by the [place]  FDA and has been authorized for detection and/or diagnosis of SARS-CoV-2 by FDA under an Emergency Use Authorization (EUA). This EUA will remain in effect (meaning this test can be used) for the duration of the COVID-19 declaration under Section 564(b)(1) of the Act, 21 U.S.C. section 360bbb-3(b)(1), unless the authorization is terminated or revoked.  Performed at [facility], [address], [place], [place] [address]      Labs: Basic Metabolic Panel: Recent Labs  Lab 05/16/24 [phone] 05/17/24 9746 05/18/24 9340 05/18/24 9055 05/19/24 0459 05/20/24 0434  05/21/24 0724  NA 138 140 140 140  140 138 139 140  K 3.2* 4.1 3.5 3.3*  3.3* 3.6 3.6 4.0  CL 104 105 103  --  101 102 104  CO2 25 25 28   --  27 26 28   GLUCOSE 133* 104* 146*  --  189* 170* 179*  BUN 29* 36* 47*  --  43* 38* 34*  CREATININE 2.09* 2.32* 2.49*  --  2.58* 2.35* 2.28*  CALCIUM  8.0* 8.4* 8.4*  --  8.5* 8.9 8.9  MG 2.0  --  2.1  --  2.1 2.2  --    Liver Function Tests: No results for input(s): AST, ALT, ALKPHOS, BILITOT, PROT, ALBUMIN in the last 168 hours. No results for input(s): LIPASE, AMYLASE in the last 168 hours. No results for input(s): AMMONIA in the last 168 hours. CBC: Recent Labs  Lab 05/16/24 0259 05/17/24 0253  05/18/24 0659 05/18/24 0944 05/19/24 0459 05/20/24 0434  WBC 6.1 7.1 6.6  --  7.6 9.1  HGB 11.3* 11.7* 10.0* 10.5*  10.5* 9.9* 10.0*  HCT 35.5* 37.3* 31.5* 31.0*  31.0* 30.8* 31.6*  MCV 82.0 82.3 83.1  --  80.8 83.2  PLT 180 203 199  --  180 238   Cardiac Enzymes: No results for input(s): CKTOTAL, CKMB, CKMBINDEX, TROPONINI in the last 168 hours. BNP: BNP (last 3 results) Recent Labs    01/13/24 1208 01/30/24 1340  BNP 170.3* 119.0*    ProBNP (last 3 results) Recent Labs    05/13/24 0051  PROBNP 8,752.0*    CBG: Recent Labs  Lab 05/20/24 1110 05/20/24 1626 05/20/24 1932 05/20/24 2202 05/21/24 0626  GLUCAP 224* 127* 232* 179* 139*       Signed:  Sigurd Pac MD.  [facility] 05/21/2024, 10:05 AM       [1]  Allergies Allergen Reactions   Erythromycin Nausea And Vomiting   Nsaids Other (See Comments)    Contraindication due to CKD    Onion Nausea And Vomiting

## 2024-05-21 NOTE — Progress Notes (Addendum)
 Patient ID: Thomas Gardner, male   DOB: 25-Jun-1955, 69 y.o.   MRN: [id number]     Advanced Heart Failure Rounding Note  Cardiologist: Annabella Scarce, MD  AHF Cardiologist: Dr. Rolan Chief Complaint: A/C HFrEf Patient Profile   Thomas Gardner is a 69 y.o. male with iCM, chronic HFrEF, unvascularized 2v CAD (pRCA CTA and 70% pLAD/mLAD, 80% dLAD), HTN, HLD, DMII, CKD stage 3, morbid obesity, PVCs, asthma, prostate cancer s/p radiation, OSA, CVA (10/22') and carotid stenosis.   Significant events:   05/17/24: AHF team consulted for concerns of low output HF. PICC placed. Milrinone  started. Diuretics increased.  05/20/24: Milrinone  stopped.   Subjective:    CVP <5  Co-ox 68% off milrinone .   No labs done this morning.   Feels good this morning still with some congestion but otherwise stable. Denies CP/SOB.   Objective:   Weight Range: 101.6 kg Body mass index is 36.15 kg/m.   Vital Signs:   Temp:  [97.4 F (36.3 C)-98.4 F (36.9 C)] 97.6 F (36.4 C) (01/02 0405) Pulse Rate:  [93-104] 102 (01/02 0405) Resp:  [18-20] 18 (01/02 0405) BP: (117-175)/(53-103) 154/82 (01/02 0405) SpO2:  [96 %-98 %] 96 % (01/02 0405) Weight:  [101.6 kg] 101.6 kg (01/02 0407) Last BM Date : 05/20/24  Weight change: Filed Weights   05/19/24 0305 05/20/24 0356 05/21/24 0407  Weight: 103.4 kg 103.5 kg 101.6 kg    Intake/Output:   Intake/Output Summary (Last 24 hours) at 05/21/2024 0711 Last data filed at 05/21/2024 0407 Gross per 24 hour  Intake 240 ml  Output 2147 ml  Net -1907 ml     Physical Exam  General:  well appearing.  No respiratory difficulty Neck: JVD ~5 cm.  Cor: Regular rate & rhythm. No murmurs. Lungs: clear Extremities: +1 RLE edema, trace LLE edema Neuro: alert & oriented x 3. Affect pleasant.   Telemetry   NSR  90s with PVCs (Personally reviewed)    Labs   CBC Recent Labs    05/19/24 0459 05/20/24 0434  WBC 7.6 9.1  HGB 9.9* 10.0*  HCT 30.8* 31.6*  MCV  80.8 83.2  PLT 180 238   Basic Metabolic Panel Recent Labs    87/68/74 0459 05/20/24 0434  NA 138 139  K 3.6 3.6  CL 101 102  CO2 27 26  GLUCOSE 189* 170*  BUN 43* 38*  CREATININE 2.58* 2.35*  CALCIUM  8.5* 8.9  MG 2.1 2.2   Liver Function Tests No results for input(s): AST, ALT, ALKPHOS, BILITOT, PROT, ALBUMIN in the last 72 hours. No results for input(s): LIPASE, AMYLASE in the last 72 hours. Cardiac Enzymes No results for input(s): CKTOTAL, CKMB, CKMBINDEX, TROPONINI in the last 72 hours.  BNP: BNP (last 3 results) Recent Labs    01/13/24 1208 01/30/24 1340  BNP 170.3* 119.0*    ProBNP (last 3 results) Recent Labs    05/13/24 0051  PROBNP 8,752.0*     D-Dimer No results for input(s): DDIMER in the last 72 hours. Hemoglobin A1C No results for input(s): HGBA1C in the last 72 hours. Fasting Lipid Panel No results for input(s): CHOL, HDL, LDLCALC, TRIG, CHOLHDL, LDLDIRECT in the last 72 hours. Medications:   Scheduled Medications:  allopurinol   100 mg Oral Daily   aspirin  EC  81 mg Oral Daily   atorvastatin   80 mg Oral Daily   Chlorhexidine  Gluconate Cloth  6 each Topical Daily   empagliflozin   10 mg Oral Daily   enoxaparin  (LOVENOX )  injection  40 mg Subcutaneous Daily   ferrous sulfate  325 mg Oral Q breakfast   insulin  aspart  0-9 Units Subcutaneous TID WC   isosorbide -hydrALAZINE   2 tablet Oral TID   mexiletine  150 mg Oral Q12H   sodium chloride  flush  10-40 mL Intracatheter Q12H   tamsulosin   0.4 mg Oral Daily   torsemide  20 mg Oral Daily    Infusions:    PRN Medications: acetaminophen  **OR** acetaminophen , albuterol , guaiFENesin -dextromethorphan , lip balm, loratadine , polyethylene glycol, senna, sodium chloride  flush  Assessment/Plan   1. 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 History of frequent PVCs (9.6% on Zio in 8/24) as well as HTN.   Echo back in 7/22 with EF 35-40%.  Echoes in 10/22 and 8/24 with EF 50%, echo in 8/25 with EF 35-40% led to cath showing occluded RCA with collaterals and long 70% proximal LAD stenosis (hemodynamically significant by FFR) as well as 80% distal LAD stenosis.  With this admission, echo showed EF <20% with moderate LV dilation, RV normal, IVC dilated.  EF is decreased out of proportion to cath.  Based on Revived-BCIS data, decision made not to intervene on the moderate (but hemodynamically significant) LAD stenosis given lack of ACS/chest pain.  Milrinone  begun 12/29 (co-ox 46%).  He has diuresed well but creatinine rose to 2.58. RHC on milrinone  0.125 showed good CO and normal filling pressures.  He has residual peripheral edema that is likely due to venous insufficiency. Co-ox 68% today, CVP <5 and creatinine had started to trend down (2.35 today, labs pending this morning).   - Continue torsemide 20 mg po daily + 20 mg PRN as needed for 3lb weight gain or 5lbs in 1 week.  - Stable off milrinone .  - Start coreg  3.125 mg BID - Continue Unna boots.  - Continue Bidil  2 tabs tid.  - Continue Jardiance  10 daily.  - GDMT will be limited by AKI on CKD stage 3 => Entresto  and spironolactone  will remain on hold for now.  2. CAD: Cath 10/25 showed occluded RCA with collaterals and long 70% proximal LAD stenosis (hemodynamically significant by FFR) as well as 80% distal LAD stenosis.  Patient has not had chest pain, presentation not consistent with ACS.  Seen by Dr. Wendel initially, and based on Revived-BCIS data, decision made not to intervene on the moderate (but hemodynamically significant) LAD stenosis given lack of ACS/chest pain. I think this is reasonable as I am not sure he would derive much benefit in absence of ACS.  - Continue ASA 81 - Continue atorvastatin  (on Repatha  at home).  3. AKI on CKD stage 3: Baseline creatinine around 1.6, up to 2.58 but now trending down to 2.35.  Suspect cardiorenal  syndrome. CVP 5 today.   4. H/o CVA in 10/22 with mod-severe right PCA stenosis on CTA head/neck.  - Continue ASA 81 - Continue atorvastatin  80, on Repatha  at home.  5. PVCs: H/o frequent PVCs, ?contribute to cardiomyopathy.   - On amiodarone  IV while on milrinone . - Will transition him to mexiletine 150 bid now that we are stopping milrinone  for long-term PVC suppression.  6. OSA: Did not tolerate CPAP.  7. Pleural effusions: Noted on initial CXR.  Followup CXR was improved.    Ok for discharge today as long as labs come back stable. Has follow up in AHF clinic.   AHF meds at discharge:  ASA 81 mg daily Atorvastatin  80 mg daily Coreg  3.125 mg BID  Jardiance  10 mg daily Bidil  2 mg TID Mexilitine 150 mg BID Torsemide 20 mg daily KDUR 20 mEq daily  Length of Stay: 8  Thomas LITTIE Coe, NP  05/21/2024, 7:11 AM  Advanced Heart Failure Team Pager [phone] (M-F; 7a - 5p)   Please visit [email or web address]: For overnight coverage please call cardiology fellow first. If fellow not available call Shock/ECMO MD on call.  For ECMO / Mechanical Support (Impella, IABP, LVAD) issues call Shock / ECMO MD on call.    Patient seen with NP, I formulated the plan and agree with the above note.   Stable today, CVP < 5 with co-ox 68% off milrinone . No BMET yet today.   Occasional cough, otherwise ok.    General: NAD Neck: No JVD, no thyromegaly or thyroid nodule.  Lungs: Clear to auscultation bilaterally with normal respiratory effort. CV: Nondisplaced PMI.  Heart regular S1/S2, no S3/S4, no murmur.  1+ edema to knees.  Abdomen: Soft, nontender, no hepatosplenomegaly, no distention.  Skin: Intact without lesions or rashes.  Neurologic: Alert and oriented x 3.  Psych: Normal affect. Extremities: No clubbing or cyanosis.  HEENT: Normal.   Volume status improved and now off milrinone .  Will plan on continuing torsemide 20 mg daily for home.  Can add low dose Coreg  3.125 mg bid today.   If BMET stable this  morning (send now), will plan on getting him home today.   Thomas Gardner 05/21/2024 7:32 AM

## 2024-05-21 NOTE — Progress Notes (Signed)
 Discharge Nurse Summary: DC order noted per MD. DC RN at bedside with patient. Patient agreeable with discharge plan, agreeable to transport to dc lounge to await TOC meds and family pickup at 5:30pm. AVS printed/reviewed by Primary RN. PIV removed, skin intact. Telemonitor not present on assessment. No DME needs. TOC meds pending pickup. CP/Edu resolved. Patient dressed and ready to go. All belongings accounted for. Transport wheeled patient downstairs to [employer].  Rosario Lund, RN

## 2024-05-21 NOTE — TOC Transition Note (Signed)
 Transition of Care (TOC) - Discharge Note Rayfield Gobble RN, BSN Inpatient Care Management Unit 4E- RN Case Manager See Treatment Team for direct phone # [phone]/HF Cross Coverage  Patient Details  Name: Thomas Gardner MRN: [id number] Date of Birth: [birth year]  Transition of Care [facility]) CM/SW Contact:  Gobble Rayfield Hurst, RN Phone Number: 05/21/2024, 11:17 AM   Clinical Narrative:    Pt stable for transition home today, orders placed for [facility] and DME per therapy recommendations.   CM in to speak with pt at bedside, discussed therapy recommendations for [facility] follow up- pt voiced he is agreeable to [facility]. List provided for [facility] choice Per CMS guidelines from [email or web address] website with star ratings (copy placed in shadow chart)- after review with pt- pt has selected [facility] as first choice, [facility] as backup.   Reviewed DME recommendation- pt voiced he has both cane and walker at home- declined RW at this time.   Referral for HHPT/OT called to [facility] liaison - per liaison they are unable to accept as they do not have OT available in pt's zip code at this time.  Referral called to [facility] and sent in Hub- referral has been accepted.   IP CM interventions have been completed no further needs noted.   Final next level of care: Home w Home Health Services Barriers to Discharge: Barriers Resolved   Patient Goals and CMS Choice Patient states their goals for this hospitalization and ongoing recovery are:: wants to recover CMS Medicare.gov Compare Post Acute Care list provided to:: Patient Choice offered to / list presented to : Patient      Discharge Placement                 Home w/ [facility]      Discharge Plan and Services Additional resources added to the After Visit Summary for     Discharge Planning Services: CM Consult Post Acute Care Choice: Home Health, Durable Medical Equipment          DME Arranged: Walker rolling, Patient refused services DME Agency: NA        HH Arranged: PT, OT HH Agency: [facility] Date [facility] Agency Contacted: 05/21/24 Time HH Agency Contacted: 1117 Representative spoke with at [facility] Agency: Hub/Cory  Social Drivers of Health (SDOH) Interventions SDOH Screenings   Food Insecurity: No Food Insecurity (05/13/2024)  Housing: Low Risk (05/13/2024)  Transportation Needs: No Transportation Needs (05/13/2024)  Utilities: Not At Risk (05/13/2024)  Social Connections: Patient Declined (05/13/2024)  Tobacco Use: Low Risk (05/13/2024)     Readmission Risk Interventions    05/21/2024   11:17 AM  Readmission Risk Prevention Plan  Transportation Screening Complete  PCP or Specialist Appt within 5-7 Days Complete  Home Care Screening Complete  Medication Review (RN CM) Complete

## 2024-05-26 ENCOUNTER — Other Ambulatory Visit (HOSPITAL_COMMUNITY): Payer: Self-pay

## 2024-05-26 MED ORDER — ROSUVASTATIN CALCIUM 20 MG PO TABS
20.0000 mg | ORAL_TABLET | Freq: Every day | ORAL | 3 refills | Status: DC
  Filled 2024-05-26: qty 90, 90d supply, fill #0

## 2024-05-27 ENCOUNTER — Other Ambulatory Visit: Payer: Self-pay

## 2024-05-27 ENCOUNTER — Other Ambulatory Visit (HOSPITAL_COMMUNITY): Payer: Self-pay

## 2024-05-27 MED ORDER — OLMESARTAN MEDOXOMIL 20 MG PO TABS
10.0000 mg | ORAL_TABLET | Freq: Every day | ORAL | 6 refills | Status: DC
  Filled 2024-05-27: qty 15, 30d supply, fill #0

## 2024-06-01 ENCOUNTER — Telehealth (HOSPITAL_COMMUNITY): Payer: Self-pay

## 2024-06-01 NOTE — Progress Notes (Signed)
 "   Advanced Heart Failure Clinic  PCP: Dr Maree Primary Cardiologist: Dr Raford HF MD: Dr Rolan   Chief Complaint: Heart Failure   HPI: Thomas Gardner is a 69 y.o. male with iCM, chronic HFrEF, unvascularized 2v CAD (pRCA CTA and 70% pLAD/mLAD, 80% dLAD), HTN, HLD, DMII, CKD stage 3, morbid obesity, PVCs, asthma, prostate cancer s/p radiation, OSA, CVA (10/22') and carotid stenosis. Echos 2024 LVEF 50% and in August 2025 LVEF 30-35%.    Admitted with A/C HFrEF. LVEF < 20% RV normal. Based on Revived-BCIS data, decision made not to intervene on the moderate (but hemodynamically significant) LAD stenosis given lack of ACS/chest pain. Milrinone  begun 12/29 (co-ox 46%). Hospital course complicated by AKI. Creatinine peaked at 2.58. RHC on milrinone  0.125 showed good CO and normal filling pressures. Able to wean off milrinone . GDMT limited by AKI on CKD Stage IIIa. Placed on torsemide  20 mg daily at discharge.   Today he returns for post hospital follow up. Overall feeling fine. Denies SOB/PND/Orthopnea. Appetite ok. No fever or chills. He has not been weighing at home. Taking all medications. After taking his morning medications he a little lightheaded for ~ 30 minutes. .He has not been eating prior to the morning medications. He has been taking Bidil  at 10, 2, and 6. [facility] HH following.  He works part time at [employer] at [employer].   Cardiac Test Echo  2022 LVEF 50% RV normal  2024 LVEF 50%  12/2023 LVEF 30-35%.  05/13/2024 LVEF < 20% RV normal.   Cath  02/2024  Dist LAD lesion is 80% stenosed. Prox RCA lesion is 100% stenosed. Prox LAD to Mid LAD lesion is 70% stenosed.  1.  Codominant coronary arteries with significant two-vessel coronary artery disease.  The right coronary artery is occluded proximally with faint right to right and left to right collaterals.  There is heavily calcified stenosis in the proximal LAD which was significant by virtual FFR (0.50).  2.  Left  ventricular angiography was not performed due to chronic kidney disease. 3.  LVEDP was significantly elevated at 31 mmHg after aggressive precath hydration.  CCTA 02/2024 with calcium  score of 2810 with >70% stenosis in pRCA and mod 50-60% stenosis in prox-midLAD and LCx   Zio-2024 9.6% PVC Burden      ROS: All systems negative except as listed in HPI, PMH and Problem List.  SH:  Social History   Socioeconomic History   Marital status: Single    Spouse name: Not on file   Number of children: 0   Years of education: Not on file   Highest education level: Not on file  Occupational History   Occupation: Beautician  Tobacco Use   Smoking status: Never    Passive exposure: Never   Smokeless tobacco: Never  Vaping Use   Vaping status: Never Used  Substance and Sexual Activity   Alcohol use: No    Alcohol/week: 0.0 standard drinks of alcohol   Drug use: No   Sexual activity: Not on file  Other Topics Concern   Not on file  Social History Narrative   Not on file   Social Drivers of Health   Tobacco Use: Low Risk (06/02/2024)   Patient History    Smoking Tobacco Use: Never    Smokeless Tobacco Use: Never    Passive Exposure: Never  Financial Resource Strain: Not on file  Food Insecurity: No Food Insecurity (05/13/2024)   Epic    Worried About Running Out  of Food in the Last Year: Never true    Ran Out of Food in the Last Year: Never true  Transportation Needs: No Transportation Needs (05/13/2024)   Epic    Lack of Transportation (Medical): No    Lack of Transportation (Non-Medical): No  Physical Activity: Not on file  Stress: Not on file  Social Connections: Patient Declined (05/13/2024)   Social Connection and Isolation Panel    Frequency of Communication with Friends and Family: Patient declined    Frequency of Social Gatherings with Friends and Family: Patient declined    Attends Religious Services: Patient declined    Active Member of Clubs or Organizations:  Patient declined    Attends Banker Meetings: Patient declined    Marital Status: Patient declined  Intimate Partner Violence: Not At Risk (05/13/2024)   Epic    Fear of Current or Ex-Partner: No    Emotionally Abused: No    Physically Abused: No    Sexually Abused: No  Depression (PHQ2-9): Not on file  Alcohol Screen: Not on file  Housing: Low Risk (05/13/2024)   Epic    Unable to Pay for Housing in the Last Year: No    Number of Times Moved in the Last Year: 0    Homeless in the Last Year: No  Utilities: Not At Risk (05/13/2024)   Epic    Threatened with loss of utilities: No  Health Literacy: Not on file    FH:  Family History  Problem Relation Age of Onset   Heart failure Mother    Cancer Mother        ovarian   Diabetes Mother    Stroke Father    Heart failure Sister    Heart failure Brother    Colon cancer Brother    Cancer Maternal Aunt        breast   Diabetes Maternal Aunt    Cancer Maternal Uncle        colon    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   nueruologist---- dr rosemarie;    04-10-2022  per pt residul at times balance impaired   Arthritis    back shoulders and legs   Asthma    Bilateral lower extremity edema    @ times, goes away with legs elevated   CHF (congestive heart failure) (HCC)    Complication of anesthesia    woke up with endoscopy yrs ago   Diabetes mellitus type 2, insulin  dependent (HCC) 2003   Does mobilize using cane    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 90 tablet 2   ascorbic acid  (VITAMIN C ) 500  MG tablet Take 1 tablet (500 mg total) by mouth daily. 100 tablet 0   aspirin  EC 81 MG tablet Take 1 tablet (81 mg total) by mouth daily. 90 tablet 0   atorvastatin  (LIPITOR ) 80 MG tablet Take 1 tablet (80 mg total) by mouth daily. 30 tablet 0   Continuous Glucose Sensor (FREESTYLE LIBRE 3 SENSOR) MISC Use as directed for blood sugar  monitoring at least three times daily and as needed 6 each 3   empagliflozin  (JARDIANCE ) 10 MG TABS tablet Take 1 tablet (10 mg total) by mouth in the morning. 30 tablet 0   Evolocumab  (REPATHA  SURECLICK) 140 MG/ML SOAJ Inject 140 mg into the skin every 14 (fourteen) days. Please keep your upcoming appointment for refills. 6 mL 3   glucose blood (ACCU-CHEK GUIDE TEST) test strip Use to test Blood Sugars once daily 300 strip 3   insulin  glargine (LANTUS  SOLOSTAR) 100 UNIT/ML Solostar Pen Inject 8 Units into the skin every morning AND 5 Units at bedtime.     isosorbide -hydrALAZINE  (BIDIL ) 20-37.5 MG tablet Take 2 tablets by mouth 3 (three) times daily. 180 tablet 0   mexiletine (MEXITIL ) 150 MG capsule Take 1 capsule (150 mg total) by mouth every 12 (twelve) hours. 60 capsule 0   tamsulosin  (FLOMAX ) 0.4 MG CAPS capsule Take 1 capsule (0.4 mg total) by mouth daily. 30 capsule 11   tirzepatide  (MOUNJARO ) 2.5 MG/0.5ML Pen Inject 2.5 mg into the skin once a week. 6 mL 3   torsemide  (DEMADEX ) 20 MG tablet Take 1 tablet (20 mg total) by mouth daily. 30 tablet 0   vitamin D3 (CHOLECALCIFEROL ) 25 MCG tablet Take 1 tablet (1,000 Units total) by mouth daily. 100 tablet 0   Accu-Chek Softclix Lancets lancets Use to test Blood Sugars daily. 100 each 3   acetaminophen  (TYLENOL ) 325 MG tablet Take 1-2 tablets (325-650 mg total) by mouth every 4 (four) hours as needed for mild pain.     albuterol  (VENTOLIN  HFA) 108 (90 Base) MCG/ACT inhaler Inhale 1-2 puffs into the lungs every 6 (six) hours as needed. 6.7 g 2   carvedilol  (COREG ) 3.125 MG tablet Take 1 tablet (3.125 mg total) by mouth  2 (two) times daily with a meal. 60 tablet 0   No current facility-administered medications for this encounter.   Facility-Administered Medications Ordered in Other Encounters  Medication Dose Route Frequency Provider Last Rate Last Admin   sodium phosphate  (FLEET) 7-19 GM/118ML enema 1 enema  1 enema Rectal Once Cam Morene ORN, MD        Vitals:   06/02/24 1125  BP: 136/82  Pulse: 83  SpO2: 98%  Weight: 98.1 kg (216 lb 3.2 oz)  Height: 5' 6 (1.676 m)   Wt Readings from Last 3 Encounters:  06/02/24 98.1 kg (216 lb 3.2 oz)  05/21/24 101.6 kg (224 lb)  04/13/24 109.3 kg (241 lb)    PHYSICAL EXAM: General:   No resp difficulty Neck: no JVD.  Cor: Regular rate & rhythm.  Lungs: clear Abdomen: soft, nontender, nondistended.  Extremities: no  edema Neuro: alert & oriented x3  EKG: SR 85 bpm QRS 126 ms ASSESSMENT & PLAN: 1. chronic systolic CHF: Ischemic cardiomyopathy vs mixed ischemic/nonischemic cardiomyopathy. History of frequent PVCs (9.6% on Zio in 8/24) as well as HTN.  Echo back in 7/22 with EF 35-40%.  Echoes in 10/22 and 8/24 with EF 50%, echo in 8/25 with EF 35-40% led to cath showing occluded RCA with collaterals and long 70% proximal LAD stenosis (hemodynamically significant by FFR) as well as 80% distal LAD stenosis.  With this admission, echo showed EF <20% with moderate LV dilation, RV normal, IVC dilated.  EF is decreased out of proportion to cath.  Based on Revived-BCIS data, decision made not to intervene on the moderate (but hemodynamically significant) LAD stenosis given lack of ACS/chest pain.  Milrinone  begun 12/29 (co-ox 46%).  He has diuresed well but creatinine rose to 2.58. RHC on milrinone  0.125 showed good CO and normal filling pressures.  Able to wean off milrinone  and discharged 05/21/2024.  NYHA II. Appears euvolemic. Reds 44% but does not appear overloaded on physical exam. Suspect lightheaded due to morning medications without food and taking flomax .  I have asked him to space out Bidil  dose and move last dose of Bidil , Coreg , and Flomax  to bedtime.  - Continue torsemide  20 mg po daily GDMT limited by CKD  - Contiue  coreg  3.125 mg BID - Continue Bidil  2 tabs tid. As above space out doses.  - Continue Jardiance  10 daily.  - Repeat Echo in 3 months after HF meds optimized. Continue compression stockings.  - Check BMET  2. CAD: Cath 10/25 showed occluded RCA with collaterals and long 70% proximal LAD stenosis (hemodynamically significant by FFR) as well as 80% distal LAD stenosis.  Patient has not had chest pain, presentation not consistent with ACS.  Seen by Dr. Wendel initially, and based on Revived-BCIS data, decision made not to intervene on the moderate (but hemodynamically significant) LAD stenosis given lack of ACS/chest pain.  - NO chest pain.  - Continue ASA 81 - Continue atorvastatin  (on Repatha  at home).  3.  CKD stage 3: Baseline creatinine around 1.6, but during recent hospitalization creatinine peaked at 2.58 but now trending down to 2.35. Continue SGLT2i.  Discussed avoiding NSAIDs.    -Check BMET  4. H/o CVA in 10/22 with mod-severe right PCA stenosis on CTA head/neck.  - Continue ASA 81 - Continue atorvastatin  80, on Repatha  at home.  5. PVCs: H/o frequent PVCs, ?contribute to cardiomyopathy.   - During recent hospitalization on amiodarone  IV while on milrinone . Later transitioned to mexiletine 150 bid for long-term PVC suppression. No PVCs.  6. OSA: Did not tolerate CPAP.   Instructed to bring medications to his follow up appointment. If dizziness persists may need to stop flomax .   Follow up in 2 weeks with pharmacy, 4 weeks with APP  Kervens Roper NP-C  12:17 PM    "

## 2024-06-01 NOTE — Telephone Encounter (Signed)
 Called to confirm/remind patient of their appointment at the Advanced Heart Failure Clinic on 06/02/24 11:30.   Appointment:   [x] Confirmed  [] Left mess   [] No answer/No voice mail  [] VM Full/unable to leave message  [] Phone not in service  Patient reminded to bring all medications and/or complete list.  Confirmed patient has transportation. Gave directions, instructed to utilize valet parking.

## 2024-06-02 ENCOUNTER — Ambulatory Visit: Admitting: Pharmacist Clinician (PhC)/ Clinical Pharmacy Specialist

## 2024-06-02 ENCOUNTER — Other Ambulatory Visit (HOSPITAL_COMMUNITY): Payer: Self-pay

## 2024-06-02 ENCOUNTER — Ambulatory Visit
Admit: 2024-06-02 | Discharge: 2024-06-02 | Disposition: A | Discharge status: Home / Self Care | Attending: Adult Health | Admitting: Adult Health

## 2024-06-02 ENCOUNTER — Encounter (HOSPITAL_COMMUNITY): Payer: Self-pay

## 2024-06-02 ENCOUNTER — Ambulatory Visit (HOSPITAL_COMMUNITY): Payer: Self-pay | Admitting: Adult Health

## 2024-06-02 VITALS — BP 136/82 | HR 83 | Ht 66.0 in | Wt 216.2 lb

## 2024-06-02 DIAGNOSIS — Z79899 Other long term (current) drug therapy: Secondary | ICD-10-CM | POA: Diagnosis not present

## 2024-06-02 DIAGNOSIS — Z7982 Long term (current) use of aspirin: Secondary | ICD-10-CM | POA: Diagnosis not present

## 2024-06-02 DIAGNOSIS — Z7985 Long-term (current) use of injectable non-insulin antidiabetic drugs: Secondary | ICD-10-CM | POA: Diagnosis not present

## 2024-06-02 DIAGNOSIS — Z923 Personal history of irradiation: Secondary | ICD-10-CM | POA: Diagnosis not present

## 2024-06-02 DIAGNOSIS — I130 Hypertensive heart and chronic kidney disease with heart failure and stage 1 through stage 4 chronic kidney disease, or unspecified chronic kidney disease: Secondary | ICD-10-CM | POA: Insufficient documentation

## 2024-06-02 DIAGNOSIS — G4733 Obstructive sleep apnea (adult) (pediatric): Secondary | ICD-10-CM | POA: Diagnosis not present

## 2024-06-02 DIAGNOSIS — I493 Ventricular premature depolarization: Secondary | ICD-10-CM | POA: Insufficient documentation

## 2024-06-02 DIAGNOSIS — I5022 Chronic systolic (congestive) heart failure: Secondary | ICD-10-CM | POA: Insufficient documentation

## 2024-06-02 DIAGNOSIS — E1122 Type 2 diabetes mellitus with diabetic chronic kidney disease: Secondary | ICD-10-CM | POA: Insufficient documentation

## 2024-06-02 DIAGNOSIS — I255 Ischemic cardiomyopathy: Secondary | ICD-10-CM | POA: Insufficient documentation

## 2024-06-02 DIAGNOSIS — Z8673 Personal history of transient ischemic attack (TIA), and cerebral infarction without residual deficits: Secondary | ICD-10-CM | POA: Diagnosis not present

## 2024-06-02 DIAGNOSIS — I2510 Atherosclerotic heart disease of native coronary artery without angina pectoris: Secondary | ICD-10-CM | POA: Diagnosis not present

## 2024-06-02 DIAGNOSIS — N183 Chronic kidney disease, stage 3 unspecified: Secondary | ICD-10-CM | POA: Diagnosis not present

## 2024-06-02 MED ORDER — TAMSULOSIN HCL 0.4 MG PO CAPS: 0.4000 mg | ORAL_CAPSULE | Freq: Every day | ORAL | Status: DC

## 2024-06-02 NOTE — Patient Instructions (Addendum)
 Medication Changes:  Continue medications, PLEASE Space them out:  Take AM meds around 10 am (Carvedilol  and Bidil )  Take afternoon meds (Bidil ) in early afternoon, like 3-4 pm  Take evening meds at BEDTIME (Carvedilol , Bidil , and Flomax )  Lab Work:  Labs done today, your results will be available in MyChart, we will contact you for abnormal readings.   Special Instructions // Education:  Do the following things EVERYDAY: Weigh yourself in the morning before breakfast. Write it down and keep it in a log. Take your medicines as prescribed Eat low salt foods--Limit salt (sodium) to 2000 mg per day.  Stay as active as you can everyday Limit all fluids for the day to less than 2 liters   Follow-Up in:   Please follow up with our heart failure pharmacist in 2 weeks **PLEASE BRING ALL YOUR MEDICATIONS TO THIS APPOINTMENT WITH YOU**  Your physician recommends that you schedule a follow-up appointment in: 4 weeks   At the [facility], you and your health needs are our priority. We have a designated team specialized in the treatment of Heart Failure. This Care Team includes your primary Heart Failure Specialized Cardiologist (physician), Advanced Practice Providers (APPs- Physician Assistants and Nurse Practitioners), and Pharmacist who all work together to provide you with the care you need, when you need it.   You may see any of the following providers on your designated Care Team at your next follow up:  Dr. Toribio Fuel Dr. Ezra Shuck Dr. Odis Brownie Greig Mosses, NP Caffie Shed, [place] [facility] [place], [place] Beckey Coe, NP Jordan Lee, NP Tinnie Redman, PharmD   Please be sure to bring in all your medications bottles to every appointment.   Need to Contact Us :  If you have any questions or concerns before your next appointment please send us  a message through [place] or call our office at [phone].    TO LEAVE A MESSAGE FOR THE  NURSE SELECT OPTION 2, PLEASE LEAVE A MESSAGE INCLUDING: YOUR NAME DATE OF BIRTH CALL BACK NUMBER REASON FOR CALL**this is important as we prioritize the call backs  YOU WILL RECEIVE A CALL BACK THE SAME DAY AS LONG AS YOU CALL BEFORE 4:00 PM

## 2024-06-02 NOTE — Progress Notes (Signed)
"   ReDS [name] / Clip - 06/02/24 1125       ReDS [name] / Clip   Station Marker D    Ruler Value 32    ReDS Value Range High volume overload    ReDS Actual Value 44            "

## 2024-06-02 NOTE — Progress Notes (Unsigned)
 "  Office Visit    Patient Name: [name] Date of Encounter: 06/02/2024  Primary Care Provider:  Maree Leni Edyth DELENA, MD Primary Cardiologist:  Annabella Scarce, MD  Chief Complaint    Hypertension - Advanced hypertension clinic  Past Medical History   CAD LHC 10/25 - RCA w/CTO, 70% pLAD, 80% dLAD stenosis  HFrEF 12/25 EF down to 20% w/acute CHF(hospitalized) on carvedilol , empagliflozin , Bidil   HLD Familial - 10/22 LDL 271 on Repatha , trial of atorvastatin   DM2 12/25 A1c 6.8, on Lantus , tirzepatide   CKD 3b - followed by Dr. Macel  OSA Sleep study ordered  CVA 10/22    Allergies[1]  History of Present Illness    Thomas Gardner is a 69 y.o. male patient who was referred to the Advanced Hypertension Clinic   Blood Pressure Goal:  130/80  Current Medications: carvedilol  3.125 mg bid, olmesartan  10 mg daily  Adherence Assessment  Do you ever forget to take your medication? [] Yes [] No  Do you ever skip doses due to side effects? [] Yes [] No  Do you have trouble affording your medicines? [] Yes [] No  Are you ever unable to pick up your medication due to transportation difficulties? [] Yes [] No  Do you ever stop taking your medications because you don't believe they are helping? [] Yes [] No  Do you check your weight daily? [] Yes [] No   Adherence strategy: ***  Barriers to obtaining medications: ***  Previously tried:     Family Hx:     Social Hx:      Tobacco:  Alcohol:  Caffeine:    Diet:      Exercise:   Home BP readings:       Accessory Clinical Findings    Lab Results  Component Value Date   CREATININE 2.28 (H) 05/21/2024   BUN 34 (H) 05/21/2024   NA 140 05/21/2024   K 4.0 05/21/2024   CL 104 05/21/2024   CO2 28 05/21/2024   Lab Results  Component Value Date   ALT 23 05/13/2024   AST 93 (H) 05/13/2024   ALKPHOS 104 05/13/2024   BILITOT 0.4 05/13/2024   Lab Results  Component Value Date   HGBA1C 6.8 (H) 05/13/2024    Screening  for Secondary Hypertension: { Click here to document screening for secondary causes of HTN  :1}     Relevant Labs/Studies:    Latest Ref Rng & Units 05/21/2024    7:24 AM 05/20/2024    4:34 AM 05/19/2024    4:59 AM  Basic Labs  Sodium 135 - 145 mmol/L 140  139  138   Potassium 3.5 - 5.1 mmol/L 4.0  3.6  3.6   Creatinine 0.61 - 1.24 mg/dL 7.71  7.64  7.41        Latest Ref Rng & Units 05/13/2024    6:00 AM 03/03/2021    5:00 AM  Thyroid   TSH 0.350 - 4.500 uIU/mL 0.769  1.331                 04/13/2024    8:57 AM  Renovascular   Renal Artery US  Completed Yes      Home Medications    Current Outpatient Medications  Medication Sig Dispense Refill   Accu-Chek Softclix Lancets lancets Use to test Blood Sugars daily. 100 each 3   acetaminophen  (TYLENOL ) 325 MG tablet Take 1-2 tablets (325-650 mg total) by mouth every 4 (four) hours as needed for mild pain.     albuterol  (VENTOLIN  HFA)  108 (90 Base) MCG/ACT inhaler Inhale 1-2 puffs into the lungs every 6 (six) hours as needed. 6.7 g 2   allopurinol  (ZYLOPRIM ) 100 MG tablet Take 1 tablet (100 mg total) by mouth daily. (Patient taking differently: Take 50 mg by mouth 2 (two) times daily.) 90 tablet 2   ascorbic acid  (VITAMIN C ) 500 MG tablet Take 1 tablet (500 mg total) by mouth daily. 100 tablet 0   aspirin  EC 81 MG tablet Take 1 tablet (81 mg total) by mouth daily. 90 tablet 0   atorvastatin  (LIPITOR ) 80 MG tablet Take 1 tablet (80 mg total) by mouth daily. 30 tablet 0   carvedilol  (COREG ) 3.125 MG tablet Take 1 tablet (3.125 mg total) by mouth 2 (two) times daily with a meal. 60 tablet 0   Continuous Glucose Sensor (FREESTYLE LIBRE 3 SENSOR) MISC Use as directed for blood sugar monitoring at least three times daily and as needed (Patient not taking: Reported on 05/13/2024) 6 each 3   empagliflozin  (JARDIANCE ) 10 MG TABS tablet Take 1 tablet (10 mg total) by mouth in the morning. 30 tablet 0   Evolocumab  (REPATHA  SURECLICK) 140  MG/ML SOAJ Inject 140 mg into the skin every 14 (fourteen) days. Please keep your upcoming appointment for refills. 6 mL 3   glucose blood (ACCU-CHEK GUIDE TEST) test strip Use to test Blood Sugars once daily 300 strip 3   insulin  glargine (LANTUS  SOLOSTAR) 100 UNIT/ML Solostar Pen Inject 8 Units into the skin every morning AND 5 Units at bedtime.     isosorbide -hydrALAZINE  (BIDIL ) 20-37.5 MG tablet Take 2 tablets by mouth 3 (three) times daily. 180 tablet 0   mexiletine (MEXITIL ) 150 MG capsule Take 1 capsule (150 mg total) by mouth every 12 (twelve) hours. 60 capsule 0   olmesartan  (BENICAR ) 20 MG tablet Take 1/2 tablet (10 mg total) by mouth daily. 15 tablet 6   rosuvastatin  (CRESTOR ) 20 MG tablet Take 1 tablet by mouth daily at bedtime 90 tablet 3   tamsulosin  (FLOMAX ) 0.4 MG CAPS capsule Take 1 capsule (0.4 mg total) by mouth daily. (Patient not taking: Reported on 05/13/2024) 30 capsule 11   tirzepatide  (MOUNJARO ) 2.5 MG/0.5ML Pen Inject 2.5 mg into the skin once a week. 6 mL 3   torsemide  (DEMADEX ) 20 MG tablet Take 1 tablet (20 mg total) by mouth daily. 30 tablet 0   vitamin D3 (CHOLECALCIFEROL ) 25 MCG tablet Take 1 tablet (1,000 Units total) by mouth daily. 100 tablet 0   No current facility-administered medications for this visit.   Facility-Administered Medications Ordered in Other Visits  Medication Dose Route Frequency Provider Last Rate Last Admin   sodium phosphate  (FLEET) 7-19 GM/118ML enema 1 enema  1 enema Rectal Once Cam Morene ORN, MD         Assessment & Plan   No BP recorded.  {Refresh Note OR Click here to enter BP  :1}***   No problem-specific Assessment & Plan notes found for this encounter.   Ianna Salmela PharmD CPP [facility]  [address] [place], [place] [address] [phone]    [1]  Allergies Allergen Reactions   Erythromycin Nausea And Vomiting   Nsaids Other (See Comments)    Contraindication due to CKD    Onion  Nausea And Vomiting   "

## 2024-06-09 ENCOUNTER — Other Ambulatory Visit (HOSPITAL_COMMUNITY): Payer: Self-pay

## 2024-06-09 ENCOUNTER — Ambulatory Visit: Admitting: Podiatry

## 2024-06-09 ENCOUNTER — Encounter: Payer: Self-pay | Admitting: Podiatry

## 2024-06-09 DIAGNOSIS — Z794 Long term (current) use of insulin: Secondary | ICD-10-CM

## 2024-06-09 DIAGNOSIS — M79675 Pain in left toe(s): Secondary | ICD-10-CM

## 2024-06-09 DIAGNOSIS — M79674 Pain in right toe(s): Secondary | ICD-10-CM

## 2024-06-09 DIAGNOSIS — B351 Tinea unguium: Secondary | ICD-10-CM

## 2024-06-09 DIAGNOSIS — E0800 Diabetes mellitus due to underlying condition with hyperosmolarity without nonketotic hyperglycemic-hyperosmolar coma (NKHHC): Secondary | ICD-10-CM

## 2024-06-09 DIAGNOSIS — L853 Xerosis cutis: Secondary | ICD-10-CM

## 2024-06-09 MED ORDER — AMMONIUM LACTATE 12 % EX LOTN
TOPICAL_LOTION | CUTANEOUS | 5 refills | Status: AC
  Filled 2024-06-09: qty 226, 30d supply, fill #0

## 2024-06-14 NOTE — Progress Notes (Signed)
 "  Subjective:  Patient ID: Thomas Gardner, male    DOB: 05/10/1956,  MRN: [id number]  Thomas Gardner presents to clinic today for preventative diabetic foot care for painful mycotic toenails of both feet that are difficult to trim. Pain interferes with daily activities and wearing enclosed shoe gear comfortably.  Patient has been hospitalized for CHF since his last visit.  Chief Complaint  Patient presents with   [facility]    NIDDM Patient with an A1C of 6.8 presents to office today for [facility] and nail trim . PCP : syed shah LMV -  05/13/2024   New problem(s): None.   PCP is Maree Leni Edyth DELENA, MD.  Allergies[1]  Review of Systems: Negative except as noted in the HPI.  Objective: No changes noted in today's physical examination. There were no vitals filed for this visit. Thomas Gardner is a pleasant 69 y.o. male in NAD. AAO x 3.  Vascular Examination: Vascular status intact b/l with palpable pedal pulses. CFT immediate b/l. Pedal hair present. Nonpitting edema b/l. No pain with calf compression b/l. Skin temperature gradient WNL b/l. No varicosities noted. No cyanosis or clubbing noted.  Neurological Examination: Pt has subjective symptoms of neuropathy. Sensation grossly intact b/l with 10 gram monofilament. Vibratory sensation intact b/l.  Dermatological Examination: Pedal skin dry and flaky b/l.  No open wounds nor interdigital macerations noted. Toenails 1-5 b/l thick, discolored, elongated with subungual debris and pain on dorsal palpation. No hyperkeratotic lesions noted b/l.   Musculoskeletal Examination: Muscle strength 5/5 to b/l LE.  No pain, crepitus noted b/l. No gross pedal deformities. Patient ambulates independently without assistive aids.   Radiographs: None  Assessment/Plan: 1. Pain due to onychomycosis of toenails of both feet   2. Xerosis cutis   3. Diabetes mellitus due to underlying condition with hyperosmolarity without coma, with long-term current use of insulin   (HCC)     Meds ordered this encounter  Medications   ammonium lactate  (LAC-HYDRIN ) 12 % lotion    Sig: Apply to both feet twice daily for dry skin.    Dispense:  400 g    Refill:  5  Consent given for treatment. Patient examined. All patient's and/or POA's questions/concerns addressed on today's visit. Mycotic toenails 1-5 b/l debrided in length and girth without incident. Continue foot and shoe inspections daily. Monitor blood glucose per PCP/Endocrinologist's recommendations.Continue soft, supportive shoe gear daily. Report any pedal injuries to medical professional. Call office if there are any quesitons/concerns. -For xerosis, Rx sent for Ammonium Lactate  Lotion 12%. Apply to feet twice daily avoiding application between toes. -Patient/POA to call should there be question/concern in the interim.   Return in about 3 months (around 09/07/2024).  Delon LITTIE Merlin, DPM       LOCATION: [address], [place] [address]                   Office [phone]   [facility] LOCATION: [address] [place], [place] [address] Office [phone]     [1]  Allergies Allergen Reactions   Erythromycin Nausea And Vomiting   Nsaids Other (See Comments)    Contraindication due to CKD    Onion Nausea And  Vomiting   "

## 2024-06-15 ENCOUNTER — Ambulatory Visit (HOSPITAL_COMMUNITY)

## 2024-06-15 ENCOUNTER — Other Ambulatory Visit (HOSPITAL_COMMUNITY): Payer: Self-pay

## 2024-06-17 ENCOUNTER — Other Ambulatory Visit: Payer: Self-pay

## 2024-06-17 ENCOUNTER — Other Ambulatory Visit: Payer: Self-pay | Admitting: Family Medicine

## 2024-06-17 ENCOUNTER — Other Ambulatory Visit (HOSPITAL_COMMUNITY): Payer: Self-pay

## 2024-06-17 MED ORDER — CARVEDILOL 3.125 MG PO TABS
3.1250 mg | ORAL_TABLET | Freq: Two times a day (BID) | ORAL | 2 refills | Status: AC
  Filled 2024-06-17 – 2024-06-18 (×2): qty 60, 30d supply, fill #0

## 2024-06-17 MED ORDER — MEXILETINE HCL 150 MG PO CAPS
150.0000 mg | ORAL_CAPSULE | Freq: Two times a day (BID) | ORAL | 0 refills | Status: DC
  Filled 2024-06-17: qty 180, 90d supply, fill #0

## 2024-06-17 MED ORDER — ISOSORB DINITRATE-HYDRALAZINE 20-37.5 MG PO TABS
2.0000 | ORAL_TABLET | Freq: Three times a day (TID) | ORAL | 2 refills | Status: DC
  Filled 2024-06-17 – 2024-06-18 (×2): qty 180, 30d supply, fill #0

## 2024-06-17 MED ORDER — TORSEMIDE 20 MG PO TABS
20.0000 mg | ORAL_TABLET | Freq: Every day | ORAL | 0 refills | Status: AC
  Filled 2024-06-17 – 2024-06-18 (×2): qty 90, 90d supply, fill #0

## 2024-06-18 ENCOUNTER — Other Ambulatory Visit (HOSPITAL_COMMUNITY): Payer: Self-pay

## 2024-06-18 ENCOUNTER — Other Ambulatory Visit: Payer: Self-pay

## 2024-06-18 MED ORDER — JARDIANCE 25 MG PO TABS
25.0000 mg | ORAL_TABLET | Freq: Every morning | ORAL | 3 refills | Status: AC
  Filled 2024-06-22: qty 90, 90d supply, fill #0

## 2024-06-21 ENCOUNTER — Other Ambulatory Visit: Payer: Self-pay

## 2024-06-21 ENCOUNTER — Other Ambulatory Visit (HOSPITAL_COMMUNITY): Payer: Self-pay

## 2024-06-22 ENCOUNTER — Other Ambulatory Visit: Payer: Self-pay | Admitting: Family Medicine

## 2024-06-22 ENCOUNTER — Ambulatory Visit (HOSPITAL_COMMUNITY)
Admission: RE | Admit: 2024-06-22 | Discharge: 2024-06-22 | Disposition: A | Source: Ambulatory Visit | Attending: Cardiology

## 2024-06-22 ENCOUNTER — Ambulatory Visit (HOSPITAL_COMMUNITY): Payer: Self-pay | Admitting: Cardiology

## 2024-06-22 ENCOUNTER — Other Ambulatory Visit (HOSPITAL_COMMUNITY): Payer: Self-pay

## 2024-06-22 VITALS — BP 120/70 | HR 82 | Wt 209.0 lb

## 2024-06-22 DIAGNOSIS — Z8546 Personal history of malignant neoplasm of prostate: Secondary | ICD-10-CM | POA: Insufficient documentation

## 2024-06-22 DIAGNOSIS — Z8673 Personal history of transient ischemic attack (TIA), and cerebral infarction without residual deficits: Secondary | ICD-10-CM | POA: Insufficient documentation

## 2024-06-22 DIAGNOSIS — J45909 Unspecified asthma, uncomplicated: Secondary | ICD-10-CM | POA: Insufficient documentation

## 2024-06-22 DIAGNOSIS — N183 Chronic kidney disease, stage 3 unspecified: Secondary | ICD-10-CM | POA: Insufficient documentation

## 2024-06-22 DIAGNOSIS — I493 Ventricular premature depolarization: Secondary | ICD-10-CM | POA: Insufficient documentation

## 2024-06-22 DIAGNOSIS — I5022 Chronic systolic (congestive) heart failure: Secondary | ICD-10-CM | POA: Diagnosis not present

## 2024-06-22 DIAGNOSIS — I255 Ischemic cardiomyopathy: Secondary | ICD-10-CM | POA: Insufficient documentation

## 2024-06-22 DIAGNOSIS — I6523 Occlusion and stenosis of bilateral carotid arteries: Secondary | ICD-10-CM | POA: Insufficient documentation

## 2024-06-22 DIAGNOSIS — I2510 Atherosclerotic heart disease of native coronary artery without angina pectoris: Secondary | ICD-10-CM | POA: Insufficient documentation

## 2024-06-22 DIAGNOSIS — G4733 Obstructive sleep apnea (adult) (pediatric): Secondary | ICD-10-CM | POA: Insufficient documentation

## 2024-06-22 DIAGNOSIS — I130 Hypertensive heart and chronic kidney disease with heart failure and stage 1 through stage 4 chronic kidney disease, or unspecified chronic kidney disease: Secondary | ICD-10-CM | POA: Insufficient documentation

## 2024-06-22 DIAGNOSIS — E785 Hyperlipidemia, unspecified: Secondary | ICD-10-CM | POA: Insufficient documentation

## 2024-06-22 DIAGNOSIS — Z923 Personal history of irradiation: Secondary | ICD-10-CM | POA: Insufficient documentation

## 2024-06-22 DIAGNOSIS — Z79899 Other long term (current) drug therapy: Secondary | ICD-10-CM | POA: Insufficient documentation

## 2024-06-22 DIAGNOSIS — E1122 Type 2 diabetes mellitus with diabetic chronic kidney disease: Secondary | ICD-10-CM | POA: Insufficient documentation

## 2024-06-22 LAB — BASIC METABOLIC PANEL WITH GFR
Anion gap: 8 (ref 5–15)
BUN: 36 mg/dL — ABNORMAL HIGH (ref 8–23)
CO2: 30 mmol/L (ref 22–32)
Calcium: 9.4 mg/dL (ref 8.9–10.3)
Chloride: 102 mmol/L (ref 98–111)
Creatinine, Ser: 1.98 mg/dL — ABNORMAL HIGH (ref 0.61–1.24)
GFR, Estimated: 36 mL/min — ABNORMAL LOW
Glucose, Bld: 187 mg/dL — ABNORMAL HIGH (ref 70–99)
Potassium: 4.3 mmol/L (ref 3.5–5.1)
Sodium: 140 mmol/L (ref 135–145)

## 2024-06-22 LAB — PRO BRAIN NATRIURETIC PEPTIDE: Pro Brain Natriuretic Peptide: 4167 pg/mL — ABNORMAL HIGH

## 2024-06-22 MED ORDER — MEXILETINE HCL 150 MG PO CAPS
150.0000 mg | ORAL_CAPSULE | Freq: Two times a day (BID) | ORAL | 3 refills | Status: AC
  Filled 2024-06-22: qty 180, 90d supply, fill #0

## 2024-06-22 MED ORDER — ISOSORB DINITRATE-HYDRALAZINE 20-37.5 MG PO TABS
1.0000 | ORAL_TABLET | Freq: Three times a day (TID) | ORAL | 3 refills | Status: AC
  Filled 2024-06-22: qty 180, 60d supply, fill #0

## 2024-06-22 MED ORDER — SPIRONOLACTONE 25 MG PO TABS
12.5000 mg | ORAL_TABLET | Freq: Every day | ORAL | 3 refills | Status: AC
  Filled 2024-06-22: qty 45, 90d supply, fill #0

## 2024-06-22 NOTE — Progress Notes (Signed)
 "    Advanced Heart Failure Clinic Note   PCP: Dr Maree Primary Cardiologist: Dr Raford HF MD: Dr Rolan   HPI:  Thomas Gardner is a 69 y.o. male with iCM, chronic HFrEF, unvascularized 2v CAD (pRCA CTA and 70% pLAD/mLAD, 80% dLAD), HTN, HLD, DMII, CKD stage 3, morbid obesity, PVCs, asthma, prostate cancer s/p radiation, OSA, CVA (02/2021) and carotid stenosis. Echos 2024 LVEF 50% and in August 2025 LVEF 30-35%.    Admitted with A/C HFrEF in 04/2024. LVEF < 20% RV normal. Based on Revived-BCIS data, decision made not to intervene on the moderate (but hemodynamically significant) LAD stenosis given lack of ACS/chest pain. Milrinone  begun (co-ox 46%). Hospital course complicated by AKI. Creatinine peaked at 2.58 mg/dL. RHC on milrinone  0.125 showed good CO and normal filling pressures. Able to wean off milrinone . GDMT limited by AKI on CKD Stage IIIa. Placed on torsemide  20 mg daily at discharge.    He returned for post hospital follow up with NP on 06/02/24. Overall felt fine. Denied SOB/PND/Orthopnea. Appetite was ok. He had not been weighing himself at home. Reported taking all medications. Stated that after his morning medications he felt a little lightheaded for ~ 30 minutes. He had not been eating prior to the morning medications. He had been taking Bidil  at 10am, 2pm, and 6pm. Reds 44%, but per NP patient did not appear overloaded on physical exam. Followed by [facility]. He worked part time at [employer] at [employer].   Today he returns to HF clinic for pharmacist medication titration. At last visit with NP, no medication changes were made and patient instructed to take final dose of BiDil , carvedilol , and tamsulosin  at bedtime due to ongoing lightheadedness. Patient reports feeling fine today. Reports ongoing dizziness and headaches after taking two tablets of the BiDil . He has been taking one tablet of BiDil  every two hours to space out and fit in all the doses throughout  the day. Patient has self-discontinued tamsulosin  due to dizziness, states he does not wish to restart. Denies chest pain, reports occasional palpitations thought to be due to PVCs. Checks weight daily at home, remains stable at ~205 lbs. Minimal LEE noted, patients reports wearing compression stockings daily. Denies PND/orthopnea. Reports his appetite has remained the same since he left the hospital, trying to eat more food with his medications. Reports good adherence to all medications, brought in all his pill bottles to today's appointment. He took his final dose of Jardiance  10 mg yesterday but has a full bottle of Jardiance  25 mg tablets, which he was on prior to his hospitalization.   HF Medications: Carvedilol  3.125 mg BID Jardiance  10 mg daily BiDil  20-37.5 mg 2 tablets TID Torsemide  20 mg daily  Has the patient been experiencing any side effects to the medications prescribed?  Patient reports ongoing dizziness and headaches after he takes two tablets of BiDil . Patient has attempted to space out doses throughout the day and reports some relief. Patient also reported dizziness with tamsulosin  which he has self-discontinued. Reports being able to tolerate all of his other medications.   Does the patient have any problems obtaining medications due to transportation or finances?   No, patient has [facility]  Understanding of regimen: good Understanding of indications: good Potential of compliance: good Patient understands to avoid NSAIDs. Patient understands to avoid decongestants.    Pertinent Lab Values:  (05/21/24): Serum creatinine 2.28 mg/dL, BUN 34 mg/dL, Potassium 4.0 mmol/L, Sodium 140 mmol/L 06/22/24: Serum  creatinine 1.98 mg/dL, BUN 36 mg/dL, Potassium 4.3 mmol/L, Sodium 140 mmol/L  Vital Signs: Weight: 209 lbs (last clinic weight: 216 lbs) Blood pressure: 120/70 mmHg (last clinic BP: 136/82 mmHg) Heart rate: 82 bpm  Assessment/Plan: 1. 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 History of frequent PVCs (9.6% on Zio in 8/24) as well as HTN.  Echo back in 11/2020 with EF 35-40%.  Echoes in 02/2021 and 12/2022 with EF 50%, echo in 12/2023 with EF 35-40% led to cath showing occluded RCA with collaterals and long 70% proximal LAD stenosis (hemodynamically significant by FFR) as well as 80% distal LAD stenosis.  With this admission, echo showed EF <20% with moderate LV dilation, RV normal, IVC dilated.  EF is decreased out of proportion to cath.  Based on Revived-BCIS data, decision made not to intervene on the moderate (but hemodynamically significant) LAD stenosis given lack of ACS/chest pain.  Milrinone  begun (co-ox 46%). Diuresed well but creatinine rose to 2.58 mg/dL. Able to wean off milrinone  and discharged 05/21/2024.  - NYHA II. Appears euvolemic. pro-BNP trending down to 4167 today. - Continue torsemide  20 mg PO daily. - Continue carvedilol  3.125 mg BID.  - Labs returned after visit, Scr improved to 1.98, Called patient and instructed to restart spironolactone  12.5 mg daily. Recheck Bmet in 1 week. He expressed understanding.  - Increase Jardiance  to 25 mg daily. Patient previously tolerated this dose prior to hospitalization (was taking for T2DM) - Decrease BiDil  to 1 tablets TID. Consider switching to alternative vasodilator if renal function stable and patient still having symptoms at upcoming appointment. Patient reports tolerating losartan  and Entresto  in the past.  - GDMT previously limited by CKD. BMET wnl today, Scr trending down to 1.98 mg/dL - Repeat Echo in 3 months after HF meds optimized per MD. Continue compression stockings.  2. CAD: Cath 02/2024 showed occluded RCA with collaterals and long 70% proximal LAD stenosis (hemodynamically significant by FFR) as well as 80% distal LAD stenosis.  Patient has not had chest pain, presentation not consistent with ACS.  Seen by Dr. Wendel initially, and based on  Revived-BCIS data, decision made not to intervene on the moderate (but hemodynamically significant) LAD stenosis given lack of ACS/chest pain.  - No chest pain - Continue aspirin  81 mg daily - Continue Repatha  - Patient unable to tolerate atorvastatin  and rosuvastatin  due to myalgias  3.  CKD stage 3: Baseline creatinine around 1.6 mg/dL, but during recent hospitalization creatinine peaked at 2.58 mg/dL but now trending down to 2.28 mg/dL.  - Increase Jardiance  to 25 mg daily - Patient instructed to avoid NSAIDs. - BMET wnl today, Scr trending down to 1.98 mg/dL  4. H/o CVA: in 02/2021 with mod-severe right PCA stenosis on CTA head/neck.  - Continue aspirin  81 mg daily - Continue Repatha  - Patient unable to tolerate atorvastatin  and rosuvastatin  due to myalgias  5. PVCs: H/o frequent PVCs, ?contribute to cardiomyopathy.   - Continue mexiletine 150 mg BID for long-term PVC suppression. Prior authorization in process for mexiletine and refills sent-in to pharmacy.   6. OSA: Did not tolerate CPAP.   Follow up with NP/PA-C in 1 week.  Recheck BMET at upcoming clinic visit.   Morna Breach, PharmD, BCPS PGY2 Cardiology Pharmacy Resident  Tinnie Redman, PharmD, BCPS, [facility], CPP Heart Failure Clinic Pharmacist [phone]   "

## 2024-06-22 NOTE — Patient Instructions (Signed)
 It was a pleasure seeing you today!  MEDICATIONS: -We are changing your medications today -Decrease BiDil  to 1 tablet three times per day -Restart Jardiance  25 mg daily -Stop Flomax  -Call if you have questions about your medications.  If renal function is stable, will restart spironoalctone 12.5 mg (1/2 tablet) daily.  LABS: -We will call you if your labs need attention.  NEXT APPOINTMENT: Return to clinic in 1 with NP/PA.  In general, to take care of your heart failure: -Limit your fluid intake to 2 Liters (half-gallon) per day.   -Limit your salt intake to ideally 2-3 grams (2000-3000 mg) per day. -Weigh yourself daily and record, and bring that weight diary to your next appointment.  (Weight gain of 2-3 pounds in 1 day typically means fluid weight.) -The medications for your heart are to help your heart and help you live longer.   -Please contact us  before stopping any of your heart medications.  Call the clinic at [phone] with questions or to reschedule future appointments.

## 2024-06-23 ENCOUNTER — Other Ambulatory Visit (HOSPITAL_COMMUNITY): Payer: Self-pay

## 2024-06-23 ENCOUNTER — Other Ambulatory Visit: Payer: Self-pay

## 2024-06-23 ENCOUNTER — Telehealth (HOSPITAL_COMMUNITY): Payer: Self-pay | Admitting: Pharmacist

## 2024-06-23 NOTE — Telephone Encounter (Signed)
 Advanced Heart Failure Patient Advocate Encounter  Prior Authorization for Mexiletine has been approved.  Prior authorization was submitted over the phone as CMM was down.  PA# [id number] Effective dates: 05/20/24 through 05/19/25  Tinnie Redman, PharmD, BCPS, BCCP, CPP Heart Failure Clinic Pharmacist [phone]

## 2024-06-24 ENCOUNTER — Other Ambulatory Visit (HOSPITAL_COMMUNITY): Payer: Self-pay

## 2024-06-30 ENCOUNTER — Ambulatory Visit (HOSPITAL_COMMUNITY)

## 2024-07-02 ENCOUNTER — Ambulatory Visit (HOSPITAL_BASED_OUTPATIENT_CLINIC_OR_DEPARTMENT_OTHER): Admitting: Family

## 2024-09-15 ENCOUNTER — Ambulatory Visit: Admitting: Podiatry
